# Patient Record
Sex: Male | Born: 1940
Health system: Southern US, Community
[De-identification: ages and names within clinical notes are randomized; demographics above are authoritative.]

## PROBLEM LIST (undated history)

## (undated) DIAGNOSIS — G7000 Myasthenia gravis without (acute) exacerbation: Secondary | ICD-10-CM

## (undated) DIAGNOSIS — N400 Enlarged prostate without lower urinary tract symptoms: Secondary | ICD-10-CM

## (undated) DIAGNOSIS — M545 Low back pain, unspecified: Secondary | ICD-10-CM

## (undated) DIAGNOSIS — R0600 Dyspnea, unspecified: Secondary | ICD-10-CM

## (undated) DIAGNOSIS — I10 Essential (primary) hypertension: Secondary | ICD-10-CM

## (undated) DIAGNOSIS — Z5189 Encounter for other specified aftercare: Secondary | ICD-10-CM

## (undated) DIAGNOSIS — E785 Hyperlipidemia, unspecified: Secondary | ICD-10-CM

## (undated) DIAGNOSIS — G5600 Carpal tunnel syndrome, unspecified upper limb: Secondary | ICD-10-CM

## (undated) DIAGNOSIS — J189 Pneumonia, unspecified organism: Secondary | ICD-10-CM

## (undated) DIAGNOSIS — K219 Gastro-esophageal reflux disease without esophagitis: Secondary | ICD-10-CM

## (undated) DIAGNOSIS — G4700 Insomnia, unspecified: Secondary | ICD-10-CM

## (undated) DIAGNOSIS — D649 Anemia, unspecified: Secondary | ICD-10-CM

## (undated) DIAGNOSIS — F0390 Unspecified dementia without behavioral disturbance: Secondary | ICD-10-CM

## (undated) DIAGNOSIS — G4730 Sleep apnea, unspecified: Secondary | ICD-10-CM

## (undated) DIAGNOSIS — M1990 Unspecified osteoarthritis, unspecified site: Secondary | ICD-10-CM

## (undated) HISTORY — DX: Unspecified osteoarthritis, unspecified site: M19.90

## (undated) HISTORY — DX: Low back pain: M54.5

## (undated) HISTORY — DX: Carpal tunnel syndrome, unspecified upper limb: G56.00

## (undated) HISTORY — DX: Hyperlipidemia, unspecified: E78.5

## (undated) HISTORY — PX: LUMBAR FUSION: SHX111

## (undated) HISTORY — PX: COLONOSCOPY: SHX174

## (undated) HISTORY — DX: Low back pain, unspecified: M54.50

## (undated) HISTORY — PX: CARPAL TUNNEL RELEASE: SHX101

## (undated) HISTORY — DX: Insomnia, unspecified: G47.00

## (undated) HISTORY — DX: Encounter for other specified aftercare: Z51.89

## (undated) HISTORY — DX: Sleep apnea, unspecified: G47.30

---
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
## 1998-02-06 ENCOUNTER — Inpatient Hospital Stay (HOSPITAL_COMMUNITY): Admission: EM | Admit: 1998-02-06 | Discharge: 1998-02-07 | Payer: Self-pay | Admitting: Emergency Medicine

## 1998-04-11 ENCOUNTER — Ambulatory Visit (HOSPITAL_COMMUNITY): Admission: RE | Admit: 1998-04-11 | Discharge: 1998-04-11 | Payer: Self-pay | Admitting: Cardiology

## 1999-10-19 ENCOUNTER — Ambulatory Visit: Admission: RE | Admit: 1999-10-19 | Discharge: 1999-10-19 | Payer: Self-pay | Admitting: Pulmonary Disease

## 2001-08-25 HISTORY — PX: TOTAL KNEE ARTHROPLASTY: SHX125

## 2001-08-31 ENCOUNTER — Encounter: Payer: Self-pay | Admitting: Orthopedic Surgery

## 2001-09-03 ENCOUNTER — Inpatient Hospital Stay (HOSPITAL_COMMUNITY): Admission: RE | Admit: 2001-09-03 | Discharge: 2001-09-07 | Payer: Self-pay | Admitting: Orthopedic Surgery

## 2004-03-11 ENCOUNTER — Encounter: Admission: RE | Admit: 2004-03-11 | Discharge: 2004-03-11 | Payer: Self-pay | Admitting: Sports Medicine

## 2004-03-25 ENCOUNTER — Encounter: Admission: RE | Admit: 2004-03-25 | Discharge: 2004-03-25 | Payer: Self-pay | Admitting: Sports Medicine

## 2004-04-09 ENCOUNTER — Encounter: Admission: RE | Admit: 2004-04-09 | Discharge: 2004-04-09 | Payer: Self-pay | Admitting: Sports Medicine

## 2004-06-03 ENCOUNTER — Ambulatory Visit (HOSPITAL_COMMUNITY): Admission: RE | Admit: 2004-06-03 | Discharge: 2004-06-04 | Payer: Self-pay | Admitting: Neurosurgery

## 2004-06-20 ENCOUNTER — Encounter: Admission: RE | Admit: 2004-06-20 | Discharge: 2004-06-20 | Payer: Self-pay | Admitting: Neurosurgery

## 2004-06-26 ENCOUNTER — Encounter: Admission: RE | Admit: 2004-06-26 | Discharge: 2004-06-26 | Payer: Self-pay | Admitting: Neurosurgery

## 2004-07-10 ENCOUNTER — Ambulatory Visit: Payer: Self-pay | Admitting: Internal Medicine

## 2005-03-27 ENCOUNTER — Ambulatory Visit (HOSPITAL_COMMUNITY): Admission: RE | Admit: 2005-03-27 | Discharge: 2005-03-27 | Payer: Self-pay | Admitting: Neurosurgery

## 2005-05-22 ENCOUNTER — Ambulatory Visit: Payer: Self-pay | Admitting: Internal Medicine

## 2005-05-23 ENCOUNTER — Ambulatory Visit: Payer: Self-pay | Admitting: Internal Medicine

## 2006-05-13 ENCOUNTER — Ambulatory Visit (HOSPITAL_COMMUNITY): Admission: RE | Admit: 2006-05-13 | Discharge: 2006-05-13 | Payer: Self-pay | Admitting: Neurosurgery

## 2006-06-10 ENCOUNTER — Ambulatory Visit: Payer: Self-pay | Admitting: Internal Medicine

## 2006-06-25 ENCOUNTER — Ambulatory Visit: Payer: Self-pay | Admitting: Internal Medicine

## 2006-06-29 ENCOUNTER — Ambulatory Visit: Payer: Self-pay | Admitting: Internal Medicine

## 2006-06-29 LAB — CONVERTED CEMR LAB
ALT: 29 units/L (ref 0–40)
AST: 24 units/L (ref 0–37)
BUN: 18 mg/dL (ref 6–23)
Basophils Absolute: 0.1 10*3/uL (ref 0.0–0.1)
Basophils Relative: 2.5 % — ABNORMAL HIGH (ref 0.0–1.0)
Bilirubin Urine: NEGATIVE
Calcium: 9.6 mg/dL (ref 8.4–10.5)
Chol/HDL Ratio, serum: 3.8
Cholesterol: 168 mg/dL (ref 0–200)
Creatinine, Ser: 0.8 mg/dL (ref 0.4–1.5)
Eosinophil percent: 3.8 % (ref 0.0–5.0)
HCT: 46.3 % (ref 39.0–52.0)
HDL: 44.3 mg/dL (ref >39.0)
Hemoglobin, Urine: NEGATIVE
Hemoglobin: 15.4 g/dL (ref 13.0–17.0)
Ketones, ur: NEGATIVE mg/dL
LDL Cholesterol: 104 mg/dL — ABNORMAL HIGH (ref 0–99)
Leukocytes, UA: NEGATIVE
Lymphocytes Relative: 38.6 % (ref 12.0–46.0)
MCHC: 33.3 g/dL (ref 30.0–36.0)
MCV: 91.7 fL (ref 78.0–100.0)
Monocytes Absolute: 0.7 10*3/uL (ref 0.2–0.7)
Monocytes Relative: 13.8 % — ABNORMAL HIGH (ref 3.0–11.0)
Neutro Abs: 2.1 10*3/uL (ref 1.4–7.7)
Neutrophils Relative %: 41.3 % — ABNORMAL LOW (ref 43.0–77.0)
Nitrite: NEGATIVE
PSA: 0.4 ng/mL (ref 0.10–4.00)
Platelets: 180 10*3/uL (ref 150–400)
Potassium: 4.7 meq/L (ref 3.5–5.1)
RBC: 5.05 M/uL (ref 4.22–5.81)
RDW: 12.7 % (ref 11.5–14.6)
Sodium: 141 meq/L (ref 135–145)
Specific Gravity, Urine: >=1.03 (ref 1.000–1.03)
TSH: 0.54 microintl units/mL (ref 0.35–5.50)
Total Protein, Urine: NEGATIVE mg/dL
Triglyceride fasting, serum: 97 mg/dL (ref 0–149)
Uric Acid, Serum: 5.5 mg/dL (ref 2.4–7.0)
Urine Glucose: NEGATIVE mg/dL
Urobilinogen, UA: 0.2 (ref 0.0–1.0)
VLDL: 19 mg/dL (ref 0–40)
WBC: 5 10*3/uL (ref 4.5–10.5)
pH: 6.5 (ref 5.0–8.0)

## 2006-07-08 ENCOUNTER — Ambulatory Visit: Payer: Self-pay

## 2006-07-28 ENCOUNTER — Inpatient Hospital Stay (HOSPITAL_COMMUNITY): Admission: RE | Admit: 2006-07-28 | Discharge: 2006-07-31 | Payer: Self-pay | Admitting: Neurosurgery

## 2006-12-24 ENCOUNTER — Ambulatory Visit: Payer: Self-pay | Admitting: Internal Medicine

## 2006-12-24 LAB — CONVERTED CEMR LAB: Vit D, 1,25-Dihydroxy: 15 — ABNORMAL LOW (ref 20–57)

## 2007-06-05 ENCOUNTER — Encounter: Payer: Self-pay | Admitting: Internal Medicine

## 2007-06-05 DIAGNOSIS — I2510 Atherosclerotic heart disease of native coronary artery without angina pectoris: Secondary | ICD-10-CM | POA: Insufficient documentation

## 2007-06-05 DIAGNOSIS — M1990 Unspecified osteoarthritis, unspecified site: Secondary | ICD-10-CM | POA: Insufficient documentation

## 2007-06-05 DIAGNOSIS — E785 Hyperlipidemia, unspecified: Secondary | ICD-10-CM | POA: Insufficient documentation

## 2007-06-08 ENCOUNTER — Ambulatory Visit: Payer: Self-pay | Admitting: Internal Medicine

## 2007-07-06 ENCOUNTER — Ambulatory Visit: Payer: Self-pay | Admitting: Internal Medicine

## 2007-07-06 DIAGNOSIS — M545 Low back pain, unspecified: Secondary | ICD-10-CM | POA: Insufficient documentation

## 2007-07-06 DIAGNOSIS — D485 Neoplasm of uncertain behavior of skin: Secondary | ICD-10-CM | POA: Insufficient documentation

## 2007-08-12 ENCOUNTER — Ambulatory Visit: Payer: Self-pay | Admitting: Internal Medicine

## 2007-08-12 DIAGNOSIS — L570 Actinic keratosis: Secondary | ICD-10-CM | POA: Insufficient documentation

## 2007-09-02 ENCOUNTER — Ambulatory Visit: Payer: Self-pay | Admitting: Internal Medicine

## 2007-09-02 LAB — CONVERTED CEMR LAB
ALT: 30 units/L (ref 0–53)
AST: 31 units/L (ref 0–37)
Albumin: 3.8 g/dL (ref 3.5–5.2)
Alkaline Phosphatase: 71 units/L (ref 39–117)
BUN: 17 mg/dL (ref 6–23)
Bilirubin, Direct: 0.1 mg/dL (ref 0.0–0.3)
CO2: 27 meq/L (ref 19–32)
Calcium: 9.1 mg/dL (ref 8.4–10.5)
Chloride: 102 meq/L (ref 96–112)
Cholesterol: 150 mg/dL (ref 0–200)
Creatinine, Ser: 0.8 mg/dL (ref 0.4–1.5)
GFR calc Af Amer: 124 mL/min
GFR calc non Af Amer: 103 mL/min
Glucose, Bld: 106 mg/dL — ABNORMAL HIGH (ref 70–99)
HDL: 38.8 mg/dL — ABNORMAL LOW (ref >39.0)
LDL Cholesterol: 89 mg/dL (ref 0–99)
Potassium: 3.7 meq/L (ref 3.5–5.1)
Sodium: 138 meq/L (ref 135–145)
TSH: 1.11 microintl units/mL (ref 0.35–5.50)
Total Bilirubin: 1 mg/dL (ref 0.3–1.2)
Total CHOL/HDL Ratio: 3.9
Total Protein: 6.8 g/dL (ref 6.0–8.3)
Triglycerides: 112 mg/dL (ref 0–149)
VLDL: 22 mg/dL (ref 0–40)

## 2007-09-07 ENCOUNTER — Ambulatory Visit: Payer: Self-pay | Admitting: Internal Medicine

## 2007-09-07 DIAGNOSIS — R259 Unspecified abnormal involuntary movements: Secondary | ICD-10-CM | POA: Insufficient documentation

## 2007-09-30 ENCOUNTER — Encounter (INDEPENDENT_AMBULATORY_CARE_PROVIDER_SITE_OTHER): Payer: Self-pay | Admitting: *Deleted

## 2008-01-14 ENCOUNTER — Ambulatory Visit: Payer: Self-pay | Admitting: Internal Medicine

## 2008-01-14 DIAGNOSIS — I10 Essential (primary) hypertension: Secondary | ICD-10-CM | POA: Insufficient documentation

## 2008-01-14 DIAGNOSIS — J310 Chronic rhinitis: Secondary | ICD-10-CM | POA: Insufficient documentation

## 2008-01-14 DIAGNOSIS — R109 Unspecified abdominal pain: Secondary | ICD-10-CM | POA: Insufficient documentation

## 2008-01-14 DIAGNOSIS — G4700 Insomnia, unspecified: Secondary | ICD-10-CM | POA: Insufficient documentation

## 2008-02-01 ENCOUNTER — Ambulatory Visit: Payer: Self-pay

## 2008-02-24 ENCOUNTER — Encounter: Admission: RE | Admit: 2008-02-24 | Discharge: 2008-02-24 | Payer: Self-pay | Admitting: Neurosurgery

## 2008-06-15 ENCOUNTER — Ambulatory Visit: Payer: Self-pay | Admitting: Internal Medicine

## 2008-07-05 ENCOUNTER — Ambulatory Visit: Payer: Self-pay | Admitting: Internal Medicine

## 2008-07-06 LAB — CONVERTED CEMR LAB
ALT: 29 units/L (ref 0–53)
AST: 28 units/L (ref 0–37)
Albumin: 4 g/dL (ref 3.5–5.2)
Alkaline Phosphatase: 75 units/L (ref 39–117)
BUN: 13 mg/dL (ref 6–23)
Basophils Absolute: 0 10*3/uL (ref 0.0–0.1)
Basophils Relative: 0.6 % (ref 0.0–3.0)
Bilirubin Urine: NEGATIVE
Bilirubin, Direct: 0.2 mg/dL (ref 0.0–0.3)
CO2: 30 meq/L (ref 19–32)
Calcium: 9.3 mg/dL (ref 8.4–10.5)
Chloride: 103 meq/L (ref 96–112)
Cholesterol: 235 mg/dL (ref 0–200)
Creatinine, Ser: 0.8 mg/dL (ref 0.4–1.5)
Direct LDL: 157.5 mg/dL
Eosinophils Absolute: 0.2 10*3/uL (ref 0.0–0.7)
Eosinophils Relative: 3 % (ref 0.0–5.0)
GFR calc Af Amer: 124 mL/min
GFR calc non Af Amer: 102 mL/min
Glucose, Bld: 103 mg/dL — ABNORMAL HIGH (ref 70–99)
HCT: 45.5 % (ref 39.0–52.0)
HDL: 37.6 mg/dL — ABNORMAL LOW (ref >39.0)
Hemoglobin, Urine: NEGATIVE
Hemoglobin: 15.6 g/dL (ref 13.0–17.0)
Ketones, ur: NEGATIVE mg/dL
Leukocytes, UA: NEGATIVE
Lymphocytes Relative: 39.5 % (ref 12.0–46.0)
MCHC: 34.2 g/dL (ref 30.0–36.0)
MCV: 93.8 fL (ref 78.0–100.0)
Monocytes Absolute: 0.6 10*3/uL (ref 0.1–1.0)
Monocytes Relative: 11.8 % (ref 3.0–12.0)
Neutro Abs: 2.3 10*3/uL (ref 1.4–7.7)
Neutrophils Relative %: 45.1 % (ref 43.0–77.0)
Nitrite: NEGATIVE
PSA: 0.51 ng/mL (ref 0.10–4.00)
Platelets: 197 10*3/uL (ref 150–400)
Potassium: 4.1 meq/L (ref 3.5–5.1)
RBC: 4.85 M/uL (ref 4.22–5.81)
RDW: 12.6 % (ref 11.5–14.6)
Sodium: 140 meq/L (ref 135–145)
Specific Gravity, Urine: 1.015 (ref 1.000–1.03)
TSH: 0.64 microintl units/mL (ref 0.35–5.50)
Total Bilirubin: 1.1 mg/dL (ref 0.3–1.2)
Total CHOL/HDL Ratio: 6.3
Total Protein, Urine: NEGATIVE mg/dL
Total Protein: 7 g/dL (ref 6.0–8.3)
Triglycerides: 134 mg/dL (ref 0–149)
Urine Glucose: NEGATIVE mg/dL
Urobilinogen, UA: 0.2 (ref 0.0–1.0)
VLDL: 27 mg/dL (ref 0–40)
WBC: 5.1 10*3/uL (ref 4.5–10.5)
pH: 6 (ref 5.0–8.0)

## 2008-07-14 ENCOUNTER — Ambulatory Visit: Payer: Self-pay | Admitting: Internal Medicine

## 2008-08-02 ENCOUNTER — Ambulatory Visit: Payer: Self-pay | Admitting: Internal Medicine

## 2008-11-01 ENCOUNTER — Ambulatory Visit: Payer: Self-pay | Admitting: Internal Medicine

## 2008-11-01 LAB — CONVERTED CEMR LAB
ALT: 20 units/L (ref 0–53)
AST: 24 units/L (ref 0–37)
Albumin: 4 g/dL (ref 3.5–5.2)
Alkaline Phosphatase: 60 units/L (ref 39–117)
BUN: 16 mg/dL (ref 6–23)
Bilirubin, Direct: 0.2 mg/dL (ref 0.0–0.3)
CO2: 32 meq/L (ref 19–32)
Calcium: 9.2 mg/dL (ref 8.4–10.5)
Chloride: 103 meq/L (ref 96–112)
Cholesterol: 142 mg/dL (ref 0–200)
Creatinine, Ser: 0.7 mg/dL (ref 0.4–1.5)
GFR calc Af Amer: 145 mL/min
GFR calc non Af Amer: 120 mL/min
Glucose, Bld: 98 mg/dL (ref 70–99)
HDL: 40.8 mg/dL (ref >39.0)
LDL Cholesterol: 76 mg/dL (ref 0–99)
Potassium: 4.2 meq/L (ref 3.5–5.1)
Sodium: 143 meq/L (ref 135–145)
Total Bilirubin: 1 mg/dL (ref 0.3–1.2)
Total CHOL/HDL Ratio: 3.5
Total Protein: 6.7 g/dL (ref 6.0–8.3)
Triglycerides: 125 mg/dL (ref 0–149)
VLDL: 25 mg/dL (ref 0–40)

## 2008-11-06 ENCOUNTER — Ambulatory Visit: Payer: Self-pay | Admitting: Internal Medicine

## 2008-11-06 DIAGNOSIS — B079 Viral wart, unspecified: Secondary | ICD-10-CM | POA: Insufficient documentation

## 2009-02-28 ENCOUNTER — Ambulatory Visit: Payer: Self-pay | Admitting: Internal Medicine

## 2009-02-28 LAB — CONVERTED CEMR LAB
BUN: 20 mg/dL (ref 6–23)
CO2: 32 meq/L (ref 19–32)
Calcium: 9.3 mg/dL (ref 8.4–10.5)
Chloride: 104 meq/L (ref 96–112)
Creatinine, Ser: 0.8 mg/dL (ref 0.4–1.5)
GFR calc non Af Amer: 102.19 mL/min (ref >60)
Glucose, Bld: 104 mg/dL — ABNORMAL HIGH (ref 70–99)
Potassium: 4.2 meq/L (ref 3.5–5.1)
Sodium: 142 meq/L (ref 135–145)

## 2009-03-06 ENCOUNTER — Ambulatory Visit: Payer: Self-pay | Admitting: Internal Medicine

## 2009-03-12 ENCOUNTER — Encounter: Payer: Self-pay | Admitting: Internal Medicine

## 2009-06-28 ENCOUNTER — Ambulatory Visit: Payer: Self-pay | Admitting: Internal Medicine

## 2009-06-28 LAB — CONVERTED CEMR LAB
ALT: 23 units/L (ref 0–53)
AST: 25 units/L (ref 0–37)
Albumin: 4.3 g/dL (ref 3.5–5.2)
Alkaline Phosphatase: 63 units/L (ref 39–117)
BUN: 16 mg/dL (ref 6–23)
Basophils Absolute: 0 10*3/uL (ref 0.0–0.1)
Basophils Relative: 0.3 % (ref 0.0–3.0)
Bilirubin Urine: NEGATIVE
Bilirubin, Direct: 0.2 mg/dL (ref 0.0–0.3)
CO2: 31 meq/L (ref 19–32)
Calcium: 9.6 mg/dL (ref 8.4–10.5)
Chloride: 103 meq/L (ref 96–112)
Cholesterol: 142 mg/dL (ref 0–200)
Creatinine, Ser: 0.8 mg/dL (ref 0.4–1.5)
Eosinophils Absolute: 0.2 10*3/uL (ref 0.0–0.7)
Eosinophils Relative: 3.9 % (ref 0.0–5.0)
GFR calc non Af Amer: 102.09 mL/min (ref >60)
Glucose, Bld: 100 mg/dL — ABNORMAL HIGH (ref 70–99)
HCT: 43.8 % (ref 39.0–52.0)
HDL: 46.3 mg/dL (ref >39.00)
Hemoglobin, Urine: NEGATIVE
Hemoglobin: 14.7 g/dL (ref 13.0–17.0)
Ketones, ur: NEGATIVE mg/dL
LDL Cholesterol: 83 mg/dL (ref 0–99)
Leukocytes, UA: NEGATIVE
Lymphocytes Relative: 37.3 % (ref 12.0–46.0)
Lymphs Abs: 1.8 10*3/uL (ref 0.7–4.0)
MCHC: 33.5 g/dL (ref 30.0–36.0)
MCV: 95.7 fL (ref 78.0–100.0)
Monocytes Absolute: 0.6 10*3/uL (ref 0.1–1.0)
Monocytes Relative: 13.4 % — ABNORMAL HIGH (ref 3.0–12.0)
Neutro Abs: 2.2 10*3/uL (ref 1.4–7.7)
Neutrophils Relative %: 45.1 % (ref 43.0–77.0)
Nitrite: NEGATIVE
PSA: 0.42 ng/mL (ref 0.10–4.00)
Platelets: 177 10*3/uL (ref 150.0–400.0)
Potassium: 4.9 meq/L (ref 3.5–5.1)
RBC: 4.58 M/uL (ref 4.22–5.81)
RDW: 12.4 % (ref 11.5–14.6)
Sodium: 140 meq/L (ref 135–145)
Specific Gravity, Urine: 1.01 (ref 1.000–1.030)
TSH: 0.57 microintl units/mL (ref 0.35–5.50)
Total Bilirubin: 1.1 mg/dL (ref 0.3–1.2)
Total CHOL/HDL Ratio: 3
Total Protein, Urine: NEGATIVE mg/dL
Total Protein: 7.2 g/dL (ref 6.0–8.3)
Triglycerides: 66 mg/dL (ref 0.0–149.0)
Urine Glucose: NEGATIVE mg/dL
Urobilinogen, UA: 1 (ref 0.0–1.0)
VLDL: 13.2 mg/dL (ref 0.0–40.0)
WBC: 4.8 10*3/uL (ref 4.5–10.5)
pH: 6 (ref 5.0–8.0)

## 2009-07-04 ENCOUNTER — Ambulatory Visit: Payer: Self-pay | Admitting: Internal Medicine

## 2009-07-04 DIAGNOSIS — Z87891 Personal history of nicotine dependence: Secondary | ICD-10-CM | POA: Insufficient documentation

## 2009-08-20 ENCOUNTER — Encounter: Payer: Self-pay | Admitting: Internal Medicine

## 2009-12-27 ENCOUNTER — Ambulatory Visit: Payer: Self-pay | Admitting: Internal Medicine

## 2009-12-27 LAB — CONVERTED CEMR LAB
BUN: 19 mg/dL (ref 6–23)
CO2: 31 meq/L (ref 19–32)
Calcium: 9.6 mg/dL (ref 8.4–10.5)
Chloride: 105 meq/L (ref 96–112)
Creatinine, Ser: 0.7 mg/dL (ref 0.4–1.5)
GFR calc non Af Amer: 120.91 mL/min (ref >60)
Glucose, Bld: 87 mg/dL (ref 70–99)
Potassium: 4.2 meq/L (ref 3.5–5.1)
Sodium: 142 meq/L (ref 135–145)

## 2009-12-31 ENCOUNTER — Ambulatory Visit: Payer: Self-pay | Admitting: Internal Medicine

## 2010-05-20 ENCOUNTER — Ambulatory Visit: Payer: Self-pay | Admitting: Internal Medicine

## 2010-05-20 DIAGNOSIS — R059 Cough, unspecified: Secondary | ICD-10-CM | POA: Insufficient documentation

## 2010-05-20 DIAGNOSIS — R05 Cough: Secondary | ICD-10-CM

## 2010-05-23 ENCOUNTER — Encounter: Payer: Self-pay | Admitting: Internal Medicine

## 2010-06-22 ENCOUNTER — Ambulatory Visit: Payer: Self-pay | Admitting: Internal Medicine

## 2010-07-11 ENCOUNTER — Ambulatory Visit: Payer: Self-pay | Admitting: Internal Medicine

## 2010-07-11 LAB — CONVERTED CEMR LAB
ALT: 24 units/L (ref 0–53)
AST: 27 units/L (ref 0–37)
Albumin: 4.1 g/dL (ref 3.5–5.2)
Alkaline Phosphatase: 58 units/L (ref 39–117)
BUN: 17 mg/dL (ref 6–23)
Basophils Absolute: 0 10*3/uL (ref 0.0–0.1)
Basophils Relative: 0.8 % (ref 0.0–3.0)
Bilirubin Urine: NEGATIVE
Bilirubin, Direct: 0.1 mg/dL (ref 0.0–0.3)
CO2: 29 meq/L (ref 19–32)
Calcium: 9.2 mg/dL (ref 8.4–10.5)
Chloride: 101 meq/L (ref 96–112)
Cholesterol: 137 mg/dL (ref 0–200)
Creatinine, Ser: 0.8 mg/dL (ref 0.4–1.5)
Eosinophils Absolute: 0.2 10*3/uL (ref 0.0–0.7)
Eosinophils Relative: 4.2 % (ref 0.0–5.0)
GFR calc non Af Amer: 101.77 mL/min (ref >60)
Glucose, Bld: 99 mg/dL (ref 70–99)
HCT: 40.8 % (ref 39.0–52.0)
HDL: 43.6 mg/dL (ref >39.00)
Hemoglobin, Urine: NEGATIVE
Hemoglobin: 13.7 g/dL (ref 13.0–17.0)
Ketones, ur: NEGATIVE mg/dL
LDL Cholesterol: 78 mg/dL (ref 0–99)
Leukocytes, UA: NEGATIVE
Lymphocytes Relative: 47.3 % — ABNORMAL HIGH (ref 12.0–46.0)
Lymphs Abs: 2.1 10*3/uL (ref 0.7–4.0)
MCHC: 33.7 g/dL (ref 30.0–36.0)
MCV: 94.7 fL (ref 78.0–100.0)
Monocytes Absolute: 0.6 10*3/uL (ref 0.1–1.0)
Monocytes Relative: 14.4 % — ABNORMAL HIGH (ref 3.0–12.0)
Neutro Abs: 1.5 10*3/uL (ref 1.4–7.7)
Neutrophils Relative %: 33.3 % — ABNORMAL LOW (ref 43.0–77.0)
Nitrite: NEGATIVE
PSA: 0.46 ng/mL (ref 0.10–4.00)
Platelets: 180 10*3/uL (ref 150.0–400.0)
Potassium: 4.9 meq/L (ref 3.5–5.1)
RBC: 4.31 M/uL (ref 4.22–5.81)
RDW: 13 % (ref 11.5–14.6)
Sodium: 141 meq/L (ref 135–145)
Specific Gravity, Urine: 1.025 (ref 1.000–1.030)
TSH: 0.58 microintl units/mL (ref 0.35–5.50)
Total Bilirubin: 0.8 mg/dL (ref 0.3–1.2)
Total CHOL/HDL Ratio: 3
Total Protein, Urine: NEGATIVE mg/dL
Total Protein: 6.5 g/dL (ref 6.0–8.3)
Triglycerides: 76 mg/dL (ref 0.0–149.0)
Urine Glucose: NEGATIVE mg/dL
Urobilinogen, UA: 0.2 (ref 0.0–1.0)
VLDL: 15.2 mg/dL (ref 0.0–40.0)
WBC: 4.4 10*3/uL — ABNORMAL LOW (ref 4.5–10.5)
pH: 5.5 (ref 5.0–8.0)

## 2010-07-16 ENCOUNTER — Ambulatory Visit: Payer: Self-pay | Admitting: Internal Medicine

## 2010-07-16 ENCOUNTER — Encounter: Payer: Self-pay | Admitting: Internal Medicine

## 2010-07-16 DIAGNOSIS — M79609 Pain in unspecified limb: Secondary | ICD-10-CM | POA: Insufficient documentation

## 2010-07-25 ENCOUNTER — Ambulatory Visit: Payer: Self-pay | Admitting: Internal Medicine

## 2010-07-25 ENCOUNTER — Encounter (INDEPENDENT_AMBULATORY_CARE_PROVIDER_SITE_OTHER): Payer: Self-pay | Admitting: *Deleted

## 2010-07-29 ENCOUNTER — Telehealth: Payer: Self-pay | Admitting: Internal Medicine

## 2010-08-22 ENCOUNTER — Encounter (INDEPENDENT_AMBULATORY_CARE_PROVIDER_SITE_OTHER): Payer: Self-pay

## 2010-08-25 HISTORY — PX: EYE SURGERY: SHX253

## 2010-08-27 ENCOUNTER — Ambulatory Visit
Admission: RE | Admit: 2010-08-27 | Discharge: 2010-08-27 | Payer: Self-pay | Source: Home / Self Care | Attending: Gastroenterology | Admitting: Gastroenterology

## 2010-09-11 ENCOUNTER — Ambulatory Visit
Admission: RE | Admit: 2010-09-11 | Discharge: 2010-09-11 | Payer: Self-pay | Source: Home / Self Care | Attending: Gastroenterology | Admitting: Gastroenterology

## 2010-09-11 ENCOUNTER — Encounter: Payer: Self-pay | Admitting: Gastroenterology

## 2010-09-11 LAB — HM COLONOSCOPY

## 2010-09-25 ENCOUNTER — Ambulatory Visit: Payer: Self-pay | Admitting: Internal Medicine

## 2010-09-25 ENCOUNTER — Ambulatory Visit: Admit: 2010-09-25 | Payer: Self-pay | Admitting: Internal Medicine

## 2010-09-26 NOTE — Progress Notes (Signed)
Summary: Rf Zolpidem  Phone Note Refill Request Message from:  Fax from Pharmacy  Refills Requested: Medication #1:  ZOLPIDEM TARTRATE 10 MG  TABS 1/2 or 1 by mouth at hs prn   Dosage confirmed as above?Dosage Confirmed   Supply Requested: 30  Method Requested: Telephone to Pharmacy Next Appointment Scheduled: 09-25-2010 Initial call taken by: Lanier Prude, [facility]),  July 29, 2010 11:43 AM  Follow-up for Phone Call        ok 6 ref Follow-up by: Tresa Garter MD,  July 30, 2010 8:41 AM  Additional Follow-up for Phone Call Additional follow up Details #1::        Rx called to pharmacy Additional Follow-up by: Lanier Prude, [facility]),  July 30, 2010 12:01 PM    Prescriptions: ZOLPIDEM TARTRATE 10 MG  TABS (ZOLPIDEM TARTRATE) 1/2 or 1 by mouth at hs prn  #30 x 5   Entered by:   Lanier Prude, [facility])   Authorized by:   Tresa Garter MD   Signed by:   Lanier Prude, CMA(AAMA) on 07/30/2010   Method used:   Telephoned to ...       [facility] [address] (retail)       [address]       [place], [place]  [address]       Ph: [phone] or [phone]       Fax: [phone]   RxID:   [id number]

## 2010-09-26 NOTE — Assessment & Plan Note (Signed)
Summary: CPX/ SECURE HORIZIONS/NWS  #   Vital Signs:  Patient profile:   70 year old male Height:      72 inches Weight:      255 pounds BMI:     34.71 Temp:     97.7 degrees F oral Pulse rate:   76 / minute Pulse rhythm:   regular Resp:     16 per minute BP sitting:   128 / 90  (left arm) Cuff size:   regular  Vitals Entered By: Lanier Prude, [facility]) (July 16, 2010 8:44 AM) CC: CPX Is Patient Diabetic? No   CC:  CPX.  History of Present Illness: The patient presents for a preventive health examination  Patient past medical history, social history, and family history reviewed in detail no significant changes.  Patient is physically active. Depression is negative and mood is good. Hearing is normal w/hearing aids, and able to perform activities of daily living. Risk of falling is negligible and home safety has been reviewed and is appropriate. Patient has normal height, he is overweight, and visual acuity is ok w/glasses. Patient has been counseled on age-appropriate routine health concerns for screening and prevention. Education, counseling done.  C/o pain in L buttock and posterior thigh worse w/sitting x 2 months   Preventive Screening-Counseling & Management  Alcohol-Tobacco     Alcohol drinks/day: <1     Smoking Status: quit > 6 months  Caffeine-Diet-Exercise     Caffeine Counseling: not indicated; caffeine use is not excessive or problematic     Diet Counseling: to improve diet; diet is suboptimal     Nutrition Referrals: no     Does Patient Exercise: yes     Depression Counseling: not indicated; screening negative for depression  Hep-HIV-STD-Contraception     Hepatitis Risk: no risk noted     Sun Exposure-Excessive: no  Safety-Violence-Falls     Seat Belt Use: yes     Violence in the Home: no risk noted     Fall Risk Counseling: not indicated; no significant falls noted      Sexual History:  currently monogamous.        Drug Use:  never.    Current  Medications (verified): 1)  Cymbalta 60 Mg  Cpep (Duloxetine Hcl) .Marland Kitchen.. 1 Once Daily 2)  Aspirin 325 Mg Tabs (Aspirin) 3)  Vitamin D3 1000 Unit  Tabs (Cholecalciferol) .Marland Kitchen.. 1 Qd 4)  Zolpidem Tartrate 10 Mg  Tabs (Zolpidem Tartrate) .... 1/2 or 1 By Mouth At [facility] Prn 5)  Ibuprofen 600 Mg  Tabs (Ibuprofen) .Marland Kitchen.. 1 By Mouth Two Times A Day Pc As Needed Arthritis 6)  Simvastatin 40 Mg Tabs (Simvastatin) .... Take One Tablet At Bedtime 7)  Ibuprofen 600 Mg  Tabs (Ibuprofen) .Marland Kitchen.. 1 By Mouth Two Times A Day Pc As Needed Pain 8)  Tramadol Hcl 50 Mg  Tabs (Tramadol Hcl) .Marland Kitchen.. 1-2 By Mouth Two Times A Day As Needed Pain 9)  Pennsaid 1.5 % Soln (Diclofenac Sodium) .... 3-5 Gtt To Skin Three Times Daily 10)  Promethazine-Codeine 6.25-10 Mg/39ml Syrp (Promethazine-Codeine) .... 5-10 Ml By Mouth Q Id As Needed Cough 11)  Tessalon Perles 100 Mg Caps (Benzonatate) .Marland Kitchen.. 1-2 By Mouth Two Times A Day As Needed Cogh 12)  Levaquin 500 Mg Tabs (Levofloxacin) .Marland Kitchen.. 1 By Mouth Once Daily For Lung Infection  Past History:  Past Medical History: Last updated: 01/14/2008 Coronary artery disease Dr Jens Som Hyperlipidemia OSA Osteoarthritis Low back pain CTS B Hypertension Insomnia  Past  Surgical History: Last updated: 07/06/2007 Total knee replacement R 2003 Lumbar fusion 07 Cage Dr Venetia Maxon CTS release Dr Merlyn Lot  Family History: Last updated: 01/14/2008 Family History Breast cancer 1st degree relative <50 Family History of CAD Male 1st degree relative <50 F AAA  Social History: Last updated: 2010-07-30 B died with ca - lymphomas Married Former Smoker Alcohol use-no Regular exercise-yes 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 with ca - lymphomas Married Former Smoker Alcohol use-no Regular exercise-yes Retired Smoking Status:  quit > 6 months Does Patient Exercise:  yes Hepatitis Risk:  no risk noted Sun Exposure-Excessive:  no Seat Belt Use:  yes Sexual History:  currently monogamous Drug Use:   never  Review of Systems  The patient denies anorexia, fever, weight loss, weight gain, vision loss, decreased hearing, hoarseness, chest pain, syncope, dyspnea on exertion, peripheral edema, prolonged cough, headaches, hemoptysis, abdominal pain, melena, hematochezia, severe indigestion/heartburn, hematuria, incontinence, genital sores, muscle weakness, suspicious skin lesions, transient blindness, difficulty walking, depression, unusual weight change, abnormal bleeding, enlarged lymph nodes, angioedema, and testicular masses.    Physical Exam  General:  alert.  NAD Head:  Normocephalic and atraumatic without obvious abnormalities. No apparent alopecia or balding. Eyes:  No corneal or conjunctival inflammation noted. EOMI. Perrla.  Ears:  External ear exam shows no significant lesions or deformities.  Otoscopic examination reveals clear canals, tympanic membranes are intact bilaterally without bulging, retraction, inflammation or discharge. Hearing is grossly normal bilaterally. Nose:  External nasal examination shows no deformity or inflammation. Nasal mucosa are pink and moist without lesions or exudates. Mouth:  no erythema, no exudates, and no lesions.   Neck:  supple, no masses, and no cervical lymphadenopathy.   Lungs:  normal respiratory effort, no intercostal retractions, no accessory muscle use, normal breath sounds, no dullness, no crackles, and no wheezes.   Heart:  Tachy Abdomen:  Bowel sounds positive,abdomen soft and non-tender without masses, organomegaly or hernias noted. Msk:  R buttock and R poster hamstr is very tight Str leg elev is ok Neurologic:  No cranial nerve deficits noted. Station and gait are normal. Plantar reflexes are down-going bilaterally. DTRs are symmetrical throughout. Sensory, motor and coordinative functions appear intact. Skin:  AKs Moles on back Psych:  Cognition and judgment appear intact. Alert and cooperative with normal attention span and  concentration. No apparent delusions, illusions, hallucinations   Impression & Recommendations:  Problem # 1:  Preventive Health Care (ICD-V70.0) Assessment New Overall doing well, age appropriate education and counseling updated and referral for appropriate preventive services done unless declined, immunizations up to date or declined, diet counseling done if overweight, urged to quit smoking if smokes, most recent labs reviewed and current ordered if appropriate, ecg reviewed or declined (interpretation per ECG scanned in the EMR if done); information regarding Medicare Preventation requirements given if appropriate.  The labs were reviewed with the patient.  I have personally reviewed the Medicare Annual Wellness questionnaire and have noted 1.   The patient's medical and social history 2.   Their use of alcohol, tobacco or illicit drugs 3.   Their current medications and supplements 4.   The patient's functional ability including ADL's, fall risks, home safety risks and hearing or visual             impairment. 5.   Diet and physical activities 6.   Evidence for depression or mood disorders  The patients weight, height, BMI and visual acuity have been recorded in the chart I have made  referrals, counseling and provided education to the patient based review of the above and I have provided the pt with a written personalized care plan for preventive services.  Problem # 2:  LEG PAIN (ICD-729.5) L - poss piriformis syndrome Assessment: New See "Patient Instructions".   Problem # 3:  NEOPLASM OF UNCERTAIN BEHAVIOR OF SKIN (ICD-238.2) skin biopsy and cryo to sch  Problem # 4:  HYPERTENSION (ICD-401.9) Assessment: Unchanged  Problem # 5:  OSTEOARTHRITIS (ICD-715.90) Assessment: Unchanged  The following medications were removed from the medication list:    Ibuprofen 600 Mg Tabs (Ibuprofen) .Marland Kitchen... 1 by mouth two times a day pc as needed pain His updated medication list for this problem  includes:    Aspirin 325 Mg Tabs (Aspirin)    Ibuprofen 600 Mg Tabs (Ibuprofen) .Marland Kitchen... 1 by mouth two times a day pc as needed arthritis    Tramadol Hcl 50 Mg Tabs (Tramadol hcl) .Marland Kitchen... 1-2 by mouth two times a day as needed pain  Complete Medication List: 1)  Cymbalta 60 Mg Cpep (Duloxetine hcl) .Marland Kitchen.. 1 once daily 2)  Aspirin 325 Mg Tabs (Aspirin) 3)  Vitamin D3 1000 Unit Tabs (Cholecalciferol) .Marland Kitchen.. 1 qd 4)  Zolpidem Tartrate 10 Mg Tabs (Zolpidem tartrate) .... 1/2 or 1 by mouth at hs prn 5)  Ibuprofen 600 Mg Tabs (Ibuprofen) .Marland Kitchen.. 1 by mouth two times a day pc as needed arthritis 6)  Simvastatin 40 Mg Tabs (Simvastatin) .... Take one tablet at bedtime 7)  Tramadol Hcl 50 Mg Tabs (Tramadol hcl) .Marland Kitchen.. 1-2 by mouth two times a day as needed pain 8)  Pennsaid 1.5 % Soln (Diclofenac sodium) .... 3-5 gtt to skin three times daily  Other Orders: EKG w/ Interpretation (93000) Medicare -1st Annual Wellness Visit [phone]) Gastroenterology Referral (GI)  Patient Instructions: 1)  Please schedule a follow-up appointment in 3 months. 2)  Go on Youtube ([email or web address]) and look up "piriformis stretch", "IT band stretch" and "gluteus stretch". See the anatomy and learn the symptoms.  Do the stretches - it may help!  3)  Call if you are not better in a reasonable amount of time or if worse.  4)  Skin biopsy w/me Prescriptions: IBUPROFEN 600 MG  TABS (IBUPROFEN) 1 by mouth two times a day pc as needed arthritis  #60 x 3   Entered and Authorized by:   Tresa Garter MD   Signed by:   Tresa Garter MD on 07/20/2010   Method used:   Print then Give to Patient   RxID:   [id number] ZOLPIDEM TARTRATE 10 MG  TABS (ZOLPIDEM TARTRATE) 1/2 or 1 by mouth at hs prn  #30 x 3   Entered and Authorized by:   Tresa Garter MD   Signed by:   Tresa Garter MD on 07/20/2010   Method used:   Print then Give to Patient   RxID:   [id number] CYMBALTA 60 MG  CPEP (DULOXETINE HCL) 1 once  daily  #90 x 3   Entered and Authorized by:   Tresa Garter MD   Signed by:   Tresa Garter MD on 07/20/2010   Method used:   Print then Give to Patient   RxID:   [id number]    Orders Added: 1)  EKG w/ Interpretation [93000] 2)  Medicare -1st Annual Wellness Visit [G0438] 3)  Gastroenterology Referral [GI] 4)  Est. Patient Level IV [06301]

## 2010-09-26 NOTE — Miscellaneous (Signed)
  Clinical Lists Changes  Orders: Added new Service order of Rocephin  250mg  (N0272) - Signed Added new Service order of Admin of Therapeutic Inj  intramuscular or subcutaneous (53664) - Signed      Medication Administration  Injection # 1:    Medication: Rocephin  250mg     Diagnosis: PNEUMONIA (ICD-486)    Route: IM    Site: LUOQ gluteus    Exp Date: 08/25/2012    Lot #: [id number]    Mfr: Sandoz    Comments: pt rec 1gm    Patient tolerated injection without complications    Given by: Lanier Prude, CMA(AAMA) (May 23, 2010 7:53 AM)  Orders Added: 1)  Rocephin  250mg  [J0696] 2)  Admin of Therapeutic Inj  intramuscular or subcutaneous [25956]

## 2010-09-26 NOTE — Assessment & Plan Note (Signed)
Summary: RECENT HX PHENUMONIA,COUGHING AGAIN AND CHEST HURTING...AS.   Vital Signs:  Patient profile:   70 year old male Height:      72 inches Weight:      249.50 pounds O2 Sat:      93 % Temp:     98 degrees F oral Pulse rate:   75 / minute Resp:     24 per minute BP sitting:   130 / 70  (left arm) Cuff size:   large  Vitals Entered By: Jarome Lamas (June 22, 2010 10:46 AM) CC: congestion/pb   History of Present Illness: Feels like he isn't over the respiratory infection Now with some fever---tylenol helped this  cough has recurred did seem to be over it when the antibiotics were done but symptoms have crept back  No night sweats some chills last night  slightly SOB has feeling of chest tightness No wheezing  Current Medications (verified): 1)  Cymbalta 60 Mg  Cpep (Duloxetine Hcl) .Marland Kitchen.. 1 Once Daily 2)  Aspirin 325 Mg Tabs (Aspirin) 3)  Vitamin D3 1000 Unit  Tabs (Cholecalciferol) .Marland Kitchen.. 1 Qd 4)  Zolpidem Tartrate 10 Mg  Tabs (Zolpidem Tartrate) .... 1/2 or 1 By Mouth At [facility] Prn 5)  Ibuprofen 600 Mg  Tabs (Ibuprofen) .Marland Kitchen.. 1 By Mouth Two Times A Day Pc As Needed Arthritis 6)  Simvastatin 40 Mg Tabs (Simvastatin) .... Take One Tablet At Bedtime 7)  Ibuprofen 600 Mg  Tabs (Ibuprofen) .Marland Kitchen.. 1 By Mouth Two Times A Day Pc As Needed Pain 8)  Tramadol Hcl 50 Mg  Tabs (Tramadol Hcl) .Marland Kitchen.. 1-2 By Mouth Two Times A Day As Needed Pain 9)  Pennsaid 1.5 % Soln (Diclofenac Sodium) .... 3-5 Gtt To Skin Three Times Daily 10)  Promethazine-Codeine 6.25-10 Mg/38ml Syrp (Promethazine-Codeine) .... 5-10 Ml By Mouth Q Id As Needed Cough 11)  Tessalon Perles 100 Mg Caps (Benzonatate) .Marland Kitchen.. 1-2 By Mouth Two Times A Day As Needed Cogh 12)  Levaquin 500 Mg Tabs (Levofloxacin) .Marland Kitchen.. 1 By Mouth Qd  Allergies (verified): 1)  Tramadol Hcl  Past History:  Past medical, surgical, family and social histories (including risk factors) reviewed for relevance to current acute and chronic  problems.  Past Medical History: Reviewed history from 01/14/2008 and no changes required. Coronary artery disease Dr Jens Som Hyperlipidemia OSA Osteoarthritis Low back pain CTS B Hypertension Insomnia  Past Surgical History: Reviewed history from 07/06/2007 and no changes required. Total knee replacement R 2003 Lumbar fusion 07 Cage Dr Venetia Maxon CTS release Dr Merlyn Lot  Family History: Reviewed history from 01/14/2008 and no changes required. Family History Breast cancer 1st degree relative <50 Family History of CAD Male 1st degree relative <50 F AAA  Social History: Reviewed history from 07/14/2008 and no changes required.  Married Former Smoker Alcohol use-no Regular exercise-yes Retired  Review of Systems       No vomiting some diarrhea this week---freq and loose slight abd discomfort appetite is off some since yesterday  Physical Exam  General:  alert.  NAD Mouth:  no erythema, no exudates, and no lesions.   Neck:  supple, no masses, and no cervical lymphadenopathy.   Lungs:  normal respiratory effort, no intercostal retractions, no accessory muscle use, normal breath sounds, no dullness, no crackles, and no wheezes.     Impression & Recommendations:  Problem # 1:  PNEUMONIA (ICD-486) Assessment Comment Only has recurrence after seemed to be better Clinical course and the CXR support possible atypical infection--so recurrence off the  antibioitic not surprising looks okay and lung exam negative  P: will give another antibiotic course      has follow up in  ~2 weeks with DR [name]  His updated medication list for this problem includes:    Levaquin 500 Mg Tabs (Levofloxacin) .Marland Kitchen... 1 by mouth once daily for lung infection  Complete Medication List: 1)  Cymbalta 60 Mg Cpep (Duloxetine hcl) .Marland Kitchen.. 1 once daily 2)  Aspirin 325 Mg Tabs (Aspirin) 3)  Vitamin D3 1000 Unit Tabs (Cholecalciferol) .Marland Kitchen.. 1 qd 4)  Zolpidem Tartrate 10 Mg Tabs (Zolpidem tartrate)  .... 1/2 or 1 by mouth at hs prn 5)  Ibuprofen 600 Mg Tabs (Ibuprofen) .Marland Kitchen.. 1 by mouth two times a day pc as needed arthritis 6)  Simvastatin 40 Mg Tabs (Simvastatin) .... Take one tablet at bedtime 7)  Ibuprofen 600 Mg Tabs (Ibuprofen) .Marland Kitchen.. 1 by mouth two times a day pc as needed pain 8)  Tramadol Hcl 50 Mg Tabs (Tramadol hcl) .Marland Kitchen.. 1-2 by mouth two times a day as needed pain 9)  Pennsaid 1.5 % Soln (Diclofenac sodium) .... 3-5 gtt to skin three times daily 10)  Promethazine-codeine 6.25-10 Mg/68ml Syrp (Promethazine-codeine) .... 5-10 ml by mouth q id as needed cough 11)  Tessalon Perles 100 Mg Caps (Benzonatate) .Marland Kitchen.. 1-2 by mouth two times a day as needed cogh 12)  Levaquin 500 Mg Tabs (Levofloxacin) .Marland Kitchen.. 1 by mouth once daily for lung infection  Patient Instructions: 1)  Please take the antibiotic daily for 10 more days 2)  Please keep appt with Dr Posey Rea on November 11th Prescriptions: LEVAQUIN 500 MG TABS (LEVOFLOXACIN) 1 by mouth once daily for lung infection  #10 x 0   Entered and Authorized by:   Cindee Salt MD   Signed by:   Cindee Salt MD on 06/22/2010   Method used:   [facility]        [phone] (retail[facility]       [address]       [place], [place]  [address]       Ph: [phone] or [phone]       Fax: [phone]   RxID:   [id number]    Orders Added: 1)  Est. Patient Level III [38756]

## 2010-09-26 NOTE — Miscellaneous (Signed)
Summary: Procedure consent  Procedure consent   Imported By: Lester Gulkana 07/29/2010 10:16:33  _____________________________________________________________________  External Attachment:    Type:   Image     Comment:   External Document

## 2010-09-26 NOTE — Assessment & Plan Note (Signed)
Summary: FU  $50   STC   Vital Signs:  Patient profile:   70 year old male Height:      72 inches Weight:      251 pounds BMI:     34.16 Temp:     98.2 degrees F oral Pulse rate:   63 / minute BP sitting:   120 / 84  (left arm)  Vitals Entered By: Tora Perches (March 06, 2009 8:17 AM) CC: f/u Is Patient Diabetic? No   CC:  f/u.  History of Present Illness: The patient presents for a follow up of hypertension, depression, hyperlipidemia   Current Medications (verified): 1)  Cymbalta 60 Mg  Cpep (Duloxetine Hcl) .Marland Kitchen.. 1 Once Daily 2)  Aspirin 325 Mg Tabs (Aspirin) 3)  Vitamin D3 1000 Unit  Tabs (Cholecalciferol) .Marland Kitchen.. 1 Qd 4)  Zolpidem Tartrate 10 Mg  Tabs (Zolpidem Tartrate) .... 1/2 or 1 By Mouth At [facility] Prn 5)  Ibuprofen 600 Mg  Tabs (Ibuprofen) .Marland Kitchen.. 1 By Mouth Two Times A Day Pc As Needed Arthritis 6)  Simvastatin 40 Mg Tabs (Simvastatin) .... Take One Tablet At Bedtime  Allergies: 1)  Tramadol Hcl  Past History:  Past Medical History: Last updated: 01/14/2008 Coronary artery disease Dr Jens Som Hyperlipidemia OSA Osteoarthritis Low back pain CTS B Hypertension Insomnia  Past Surgical History: Last updated: 07/06/2007 Total knee replacement R 2003 Lumbar fusion 07 Cage Dr Venetia Maxon CTS release Dr Merlyn Lot  Family History: Last updated: 01/14/2008 Family History Breast cancer 1st degree relative <50 Family History of CAD Male 1st degree relative <50 F AAA  Social History: Last updated: 07/14/2008  Married Former Smoker Alcohol use-no Regular exercise-yes Retired  Physical Exam  General:  overweight-appearing.   Nose:  External nasal examination shows no deformity or inflammation. Nasal mucosa are pink and moist without lesions or exudates. Mouth:  Oral mucosa and oropharynx without lesions or exudates.  Teeth in good repair. Neck:  No deformities, masses, or tenderness noted. Lungs:  Normal respiratory effort, chest expands symmetrically. Lungs  are clear to auscultation, no crackles or wheezes. Heart:  Normal rate and regular rhythm. S1 and S2 normal without gallop, murmur, click, rub or other extra sounds. Abdomen:  Bowel sounds positive,abdomen soft and non-tender without masses, organomegaly or hernias noted. Msk:  No deformity or scoliosis noted of thoracic or lumbar spine.   Extremities:  No clubbing, cyanosis, edema, or deformity noted with normal full range of motion of all joints.   Neurologic:  No cranial nerve deficits noted. Station and gait are normal. Plantar reflexes are down-going bilaterally. DTRs are symmetrical throughout. Sensory, motor and coordinative functions appear intact. Skin:  5 mm wart L shoulder Psych:  Cognition and judgment appear intact. Alert and cooperative with normal attention span and concentration. No apparent delusions, illusions, hallucinations   Impression & Recommendations:  Problem # 1:  HYPERTENSION (ICD-401.9) Assessment Improved On prescription therapy   Problem # 2:  LOW BACK PAIN (ICD-724.2) Assessment: Improved  His updated medication list for this problem includes:    Aspirin 325 Mg Tabs (Aspirin)    Ibuprofen 600 Mg Tabs (Ibuprofen) .Marland Kitchen... 1 by mouth two times a day pc as needed arthritis  Problem # 3:  CORONARY ARTERY DISEASE (ICD-414.00) Assessment: Unchanged  His updated medication list for this problem includes:    Aspirin 325 Mg Tabs (Aspirin)  Problem # 4:  HYPERLIPIDEMIA (ICD-272.4) Assessment: Improved  His updated medication list for this problem includes:    Simvastatin 40  Mg Tabs (Simvastatin) .Marland Kitchen... Take one tablet at bedtime  Complete Medication List: 1)  Cymbalta 60 Mg Cpep (Duloxetine hcl) .Marland Kitchen.. 1 once daily 2)  Aspirin 325 Mg Tabs (Aspirin) 3)  Vitamin D3 1000 Unit Tabs (Cholecalciferol) .Marland Kitchen.. 1 qd 4)  Zolpidem Tartrate 10 Mg Tabs (Zolpidem tartrate) .... 1/2 or 1 by mouth at hs prn 5)  Ibuprofen 600 Mg Tabs (Ibuprofen) .Marland Kitchen.. 1 by mouth two times a day pc  as needed arthritis 6)  Simvastatin 40 Mg Tabs (Simvastatin) .... Take one tablet at bedtime  Patient Instructions: 1)  Start taking a yoga class 2)  Try to eat more raw plant food, fresh and dry fruit, raw almonds, leafy vegies, whole foods and less red meat, less animal fat. Avoid processed foods (canned soups, hot dogs, sausage , frozen dinners). Avoid corn syrup or aspartam and Splenda  containing drinks. Make your own salad dressing with olive oil, wine vinegar, garlic etc. 3)  Please schedule a follow-up appointment in 4 months well w/labs. 4)  BMP prior to visit, ICD-9: 5)  Hepatic Panel prior to visit, ICD-9: 6)  Lipid Panel prior to visit, ICD-9: 7)  TSH prior to visit, ICD-9: 8)  CBC w/ Diff prior to visit, ICD-9: 9)  Urine-dip prior to visit, ICD-9: 10)  PSA prior to visit, ICD-9:

## 2010-09-26 NOTE — Letter (Signed)
Summary: Pre Visit Letter Revised  [facility]  [address] [place], [place] [address]   Phone: [phone]  Fax: [phone]        07/25/2010 MRN: [id number] [name] [address] [place], [place]  [address]             Procedure Date:  09/11/2010   Welcome to the Gastroenterology Division at [facility].    You are scheduled to see a nurse for your pre-procedure visit on 08/27/2010 at 2:30 on the 3rd floor at [facility], [address]  We ask that you try to arrive at our office 15 minutes prior to your appointment time to allow for check-in.  Please take a minute to review the attached form.  If you answer "Yes" to one or more of the questions on the first page, we ask that you call the person listed at your earliest opportunity.  If you answer "No" to all of the questions, please complete the rest of the form and bring it to your appointment.    Your nurse visit will consist of discussing your medical and surgical history, your immediate family medical history, and your medications.   If you are unable to list all of your medications on the form, please bring the medication bottles to your appointment and we will list them.  We will need to be aware of both prescribed and over the counter drugs.  We will need to know exact dosage information as well.    Please be prepared to read and sign documents such as consent forms, a financial agreement, and acknowledgement forms.  If necessary, and with your consent, a friend or relative is welcome to sit-in on the nurse visit with you.  Please bring your insurance card so that we may make a copy of it.  If your insurance requires a referral to see a specialist, please bring your referral form from your primary care physician.  No co-pay is required for this nurse visit.     If you cannot keep your appointment, please call [phone] to cancel or reschedule prior to your appointment date.  This  allows [place] the opportunity to schedule an appointment for another patient in need of care.    Thank you for choosing [facility] for your medical needs.  We appreciate the opportunity to care for you.  Please visit [place] at our website  to learn more about our practice.  Sincerely, The Gastroenterology Division

## 2010-09-26 NOTE — Procedures (Addendum)
Summary: Colonoscopy  Patient: [name] Note: All result statuses are Final unless otherwise noted.  Tests: (1) Colonoscopy (COL)   COL Colonoscopy           DONE     [facility]     [address]     [place], [place]  [address]           COLONOSCOPY PROCEDURE REPORT           PATIENT:  [name], [name]  MR#:  [id number]     BIRTHDATE:  30-Jun-1941, 69 yrs. old  GENDER:  male     ENDOSCOPIST:  Vania Rea. Jarold Motto, MD, [facility]     REF. BY:  Linda Hedges. [name], M.D.     PROCEDURE DATE:  09/11/2010     PROCEDURE:  Average-risk screening colonoscopy     G0121     ASA CLASS:  Class II     INDICATIONS:  ??? + FH     MEDICATIONS:   Fentanyl 75 mcg IV, Versed 7 mg IV           DESCRIPTION OF PROCEDURE:   After the risks benefits and     alternatives of the procedure were thoroughly explained, informed     consent was obtained.  Digital rectal exam was performed and     revealed no abnormalities.   The LB 180AL [id number] endoscope was     introduced through the anus and advanced to the cecum, which was     identified by both the appendix and ileocecal valve, limited by     poor preparation, extreme patient discomfort.  EXTREMELY DIFFICULT     EXAM.MOBILE CECUM.REPEAT EXAM WOULD REQUIRE PROPOFOL  The quality     of the prep was excellent, using MoviPrep.  The instrument was     then slowly withdrawn as the colon was fully examined.     <<PROCEDUREIMAGES>>           FINDINGS:  Scattered diverticula were found.  No polyps or cancers     were seen.  Scattered diverticula were found in the sigmoid to     descending colon segments.   Retroflexed views in the rectum     revealed it was not tolerated by the patient.    The scope was     then withdrawn from the patient and the procedure completed.           COMPLICATIONS:  None     ENDOSCOPIC IMPRESSION:     1) Diverticula, scattered     2) No polyps or cancers     3) Diverticula, scattered in the sigmoid to descending colon     segments     RECOMMENDATIONS:     1) high fiber diet     2) Continue current colorectal screening recommendations for     "routine risk" patients with a repeat colonoscopy in 10 years.     REPEAT EXAM:  No           ______________________________     Vania Rea. Jarold Motto, MD, [facility]           CC:  Linda Hedges. [name], M.D.           n.     eSIGNED:   Vania Rea. Magdalene Tardiff at 09/11/2010 09:15 AM           [place], [place], [id number]  Note: An exclamation mark (!) indicates a result that was not dispersed into the flowsheet. Document Creation  Date: 09/11/2010 9:15 AM _______________________________________________________________________  (1) Order result status: Final Collection or observation date-time: 09/11/2010 09:09 Requested date-time:  Receipt date-time:  Reported date-time:  Referring Physician:   Ordering Physician: Sheryn Bison [phone]) Specimen Source:  Source: Launa Grill Order Number: [id number] Lab site:   Appended Document: Colonoscopy    Clinical Lists Changes  Observations: Added new observation of COLONNXTDUE: 08/2020 (09/11/2010 13:48)

## 2010-09-26 NOTE — Assessment & Plan Note (Signed)
Summary: skin bx/#/cd   Vital Signs:  Patient profile:   70 year old male Height:      72 inches Weight:      255 pounds BMI:     34.71 Temp:     97.5 degrees F oral Pulse rate:   84 / minute Pulse rhythm:   regular Resp:     16 per minute BP sitting:   124 / 68  (left arm) Cuff size:   regular  Vitals Entered By: Lanier Prude, Beverly Gust) (July 25, 2010 10:55 AM)  Procedure Note Last Tetanus: Td (07/04/2009)  Mole Biopsy/Removal: The patient complains of irritation and changing mole. Indication: suspicious lesion Consent signed: yes  Procedure # 1: shave biopsy    Size (in cm): 0.4 x 0.4    Region: dorsal    Location: R wrist    Comment: Risks including but not limited by incomplete procedure, bleeding, infection, recurrence were discussed with the patient. Consent form was signed. Tolerated well. Complicatons - none.      Instrument used: dermablade    Anesthesia: 0.5 ml 1% lidocaine w/epinephrine  Procedure # 2: shave biopsy    Size (in cm): 0.6 x 0.4    Region: lateral    Location: R abd wall    Instrument used: same    Anesthesia: 1.0 ml 1% lidocaine w/epinephrine  Cleaned and prepped with: alcohol and betadine Wound dressing: neosporin and bandaid Instructions: daily dressing changes  CC: multiple skin lesions/moles Is Patient Diabetic? No   CC:  multiple skin lesions/moles.  History of Present Illness: He is here for skin biopsy  C/o cough w/yellow sputum x 1 month post his recent illness  Current Medications (verified): 1)  Cymbalta 60 Mg  Cpep (Duloxetine Hcl) .Marland Kitchen.. 1 Once Daily 2)  Aspirin 325 Mg Tabs (Aspirin) 3)  Vitamin D3 1000 Unit  Tabs (Cholecalciferol) .Marland Kitchen.. 1 Qd 4)  Zolpidem Tartrate 10 Mg  Tabs (Zolpidem Tartrate) .... 1/2 or 1 By Mouth At [facility] Prn 5)  Ibuprofen 600 Mg  Tabs (Ibuprofen) .Marland Kitchen.. 1 By Mouth Two Times A Day Pc As Needed Arthritis 6)  Simvastatin 40 Mg Tabs (Simvastatin) .... Take One Tablet At Bedtime 7)  Tramadol Hcl 50 Mg   Tabs (Tramadol Hcl) .Marland Kitchen.. 1-2 By Mouth Two Times A Day As Needed Pain 8)  Pennsaid 1.5 % Soln (Diclofenac Sodium) .... 3-5 Gtt To Skin Three Times Daily  Allergies (verified): No Known Drug Allergies  Physical Exam  General:  alert.  NAD Lungs:  normal respiratory effort, no intercostal retractions, no accessory muscle use, normal breath sounds, no dullness, no crackles, and no wheezes.   Heart:  Tachy Skin:  see procedures   Impression & Recommendations:  Problem # 1:  NEOPLASM OF UNCERTAIN BEHAVIOR OF SKIN (ICD-238.2) Assessment New  Mole #3 6x4 mm R anterior shoulderr, mole #4 11 mm on L upper back, mole #5 7 mm on midback, mole #6 8mm on R lower back and mole #7 in L axilla were removed in the identical fasion. #1,2,3 were sent to path lab. Moles #4-7 were discarded - irritated SKs.  Orders: Shave Skin Lesion 1.1-2.0 cm/trunk/arm/leg ([address]) Shave Skin Lesion 0.6-1.0 cm/trunk/arm/leg ([address]) Shave Skin Lesion 0.6-1.0 cm/trunk/arm/leg ([address]) Shave Skin Lesion 0.6-1.0 cm/trunk/arm/leg (113[address]) Shave Skin Lesion 0.6-1.0 cm/trunk/arm/leg (113[address]) Shave Skin Lesion 0.6-1.0 cm/trunk/arm/leg ([address]) Shave Skin Lesion < 0.5 cm/trunk/arm/leg ([address])  Problem # 2:  COUGH (ICD-786.2) Assessment: Unchanged Ceftin and Prom/cod CXR in Feb   Complete Medication List:  1)  Cymbalta 60 Mg Cpep (Duloxetine hcl) .Marland Kitchen.. 1 once daily 2)  Aspirin 325 Mg Tabs (Aspirin) 3)  Vitamin D3 1000 Unit Tabs (Cholecalciferol) .Marland Kitchen.. 1 qd 4)  Zolpidem Tartrate 10 Mg Tabs (Zolpidem tartrate) .... 1/2 or 1 by mouth at hs prn 5)  Ibuprofen 600 Mg Tabs (Ibuprofen) .Marland Kitchen.. 1 by mouth two times a day pc as needed arthritis 6)  Simvastatin 40 Mg Tabs (Simvastatin) .... Take one tablet at bedtime 7)  Tramadol Hcl 50 Mg Tabs (Tramadol hcl) .Marland Kitchen.. 1-2 by mouth two times a day as needed pain 8)  Pennsaid 1.5 % Soln (Diclofenac sodium) .... 3-5 gtt to skin three times daily 9)  Ceftin 500 Mg Tabs (Cefuroxime axetil) .Marland Kitchen.. 1 by  mouth bid 10)  Promethazine-codeine 6.25-10 Mg/52ml Syrp (Promethazine-codeine) .... 5-10 ml by mouth q id as needed cough Prescriptions: PROMETHAZINE-CODEINE 6.25-10 MG/5ML SYRP (PROMETHAZINE-CODEINE) 5-10 ml by mouth q id as needed cough  #300 ml x 0   Entered and Authorized by:   Tresa Garter MD   Signed by:   Tresa Garter MD on 07/25/2010   Method used:   Print then Give to Patient   RxID:   [id number] CEFTIN 500 MG TABS (CEFUROXIME AXETIL) 1 by mouth bid  #20 x 1   Entered and Authorized by:   Tresa Garter MD   Signed by:   Tresa Garter MD on 07/25/2010   Method used:   Print then Give to Patient   RxID:   [id number]    Orders Added: 1)  Shave Skin Lesion 1.1-2.0 cm/trunk/arm/leg [[address]] 2)  Shave Skin Lesion 0.6-1.0 cm/trunk/arm/leg [113[address]] 3)  Shave Skin Lesion 0.6-1.0 cm/trunk/arm/leg [113[address]] 4)  Shave Skin Lesion 0.6-1.0 cm/trunk/arm/leg [113[address]] 5)  Shave Skin Lesion 0.6-1.0 cm/trunk/arm/leg [113[address]] 6)  Shave Skin Lesion 0.6-1.0 cm/trunk/arm/leg [[address]] 7)  Shave Skin Lesion < 0.5 cm/trunk/arm/leg [[address]] 8)  Est. Patient Level III [[address]]

## 2010-09-26 NOTE — Assessment & Plan Note (Signed)
Summary: cough-headache-head congestion-runny red eyes-fever:101-stc   Vital Signs:  Patient profile:   70 year old male Height:      72 inches Weight:      253 pounds Temp:     99.7 degrees F oral Pulse rate:   88 / minute Pulse rhythm:   regular Resp:     16 per minute BP sitting:   98 / 68  (left arm) Cuff size:   regular  Vitals Entered By: Lanier Prude, Beverly Gust) (May 20, 2010 3:39 PM) CC: fever,cough,congestion X 3 days Is Patient Diabetic? No   CC:  fever, cough, and congestion X 3 days.  History of Present Illness: The patient presents with complaints of sore throat, fever, cough, sinus congestion and drainge of 5 d days duration. Not better with OTC meds. Chest hurts with coughing. Can't sleep due to cough. Muscle aches are present.  The mucus is colored. T 102 last night   Current Medications (verified): 1)  Cymbalta 60 Mg  Cpep (Duloxetine Hcl) .Marland Kitchen.. 1 Once Daily 2)  Aspirin 325 Mg Tabs (Aspirin) 3)  Vitamin D3 1000 Unit  Tabs (Cholecalciferol) .Marland Kitchen.. 1 Qd 4)  Zolpidem Tartrate 10 Mg  Tabs (Zolpidem Tartrate) .... 1/2 or 1 By Mouth At [facility] Prn 5)  Ibuprofen 600 Mg  Tabs (Ibuprofen) .Marland Kitchen.. 1 By Mouth Two Times A Day Pc As Needed Arthritis 6)  Simvastatin 40 Mg Tabs (Simvastatin) .... Take One Tablet At Bedtime 7)  Ibuprofen 600 Mg  Tabs (Ibuprofen) .Marland Kitchen.. 1 By Mouth Two Times A Day Pc As Needed Pain 8)  Tramadol Hcl 50 Mg  Tabs (Tramadol Hcl) .Marland Kitchen.. 1-2 By Mouth Two Times A Day As Needed Pain 9)  Pennsaid 1.5 % Soln (Diclofenac Sodium) .... 3-5 Gtt To Skin Three Times Daily  Allergies (verified): 1)  Tramadol Hcl  Past History:  Past Medical History: Last updated: 01/14/2008 Coronary artery disease Dr Jens Som Hyperlipidemia OSA Osteoarthritis Low back pain CTS B Hypertension Insomnia  Social History: Last updated: 07/14/2008  Married Former Smoker Alcohol use-no Regular exercise-yes Retired  Review of Systems       The patient complains of  fever, hoarseness, chest pain, dyspnea on exertion, and prolonged cough.    Physical Exam  General:  NAD, looks tired; overweight-appearing.   Head:  Normocephalic and atraumatic without obvious abnormalities. No apparent alopecia or balding. Mouth:  Erythematous throat and intranasal mucosa c/w URI   Lungs:  Crackles at bases Heart:  Tachy Abdomen:  Bowel sounds positive,abdomen soft and non-tender without masses, organomegaly or hernias noted. Msk:  No deformity or scoliosis noted of thoracic or lumbar spine.   Neurologic:  No cranial nerve deficits noted. Station and gait are normal. Plantar reflexes are down-going bilaterally. DTRs are symmetrical throughout. Sensory, motor and coordinative functions appear intact. Skin:  Intact without suspicious lesions or rashes Psych:  Cognition and judgment appear intact. Alert and cooperative with normal attention span and concentration. No apparent delusions, illusions, hallucinations   Impression & Recommendations:  Problem # 1:  COUGH (ICD-786.2) due to #2 Assessment New See "Patient Instructions". Start on the regimen of medicine(s) reflected in the chart   Orders: T-2 View CXR (71020TC)  Problem # 2:  PNEUMONIA (ICD-486) Assessment: New  His updated medication list for this problem includes:    Levaquin 500 Mg Tabs (Levofloxacin) .Marland Kitchen... 1 by mouth qd  Complete Medication List: 1)  Cymbalta 60 Mg Cpep (Duloxetine hcl) .Marland Kitchen.. 1 once daily 2)  Aspirin 325 Mg Tabs (  Aspirin) 3)  Vitamin D3 1000 Unit Tabs (Cholecalciferol) .Marland Kitchen.. 1 qd 4)  Zolpidem Tartrate 10 Mg Tabs (Zolpidem tartrate) .... 1/2 or 1 by mouth at hs prn 5)  Ibuprofen 600 Mg Tabs (Ibuprofen) .Marland Kitchen.. 1 by mouth two times a day pc as needed arthritis 6)  Simvastatin 40 Mg Tabs (Simvastatin) .... Take one tablet at bedtime 7)  Ibuprofen 600 Mg Tabs (Ibuprofen) .Marland Kitchen.. 1 by mouth two times a day pc as needed pain 8)  Tramadol Hcl 50 Mg Tabs (Tramadol hcl) .Marland Kitchen.. 1-2 by mouth two times a  day as needed pain 9)  Pennsaid 1.5 % Soln (Diclofenac sodium) .... 3-5 gtt to skin three times daily 10)  Promethazine-codeine 6.25-10 Mg/81ml Syrp (Promethazine-codeine) .... 5-10 ml by mouth q id as needed cough 11)  Tessalon Perles 100 Mg Caps (Benzonatate) .Marland Kitchen.. 1-2 by mouth two times a day as needed cogh 12)  Levaquin 500 Mg Tabs (Levofloxacin) .Marland Kitchen.. 1 by mouth qd 13)  Erythromycin 5 Mg/gm Oint (Erythromycin) .... In affected eye(s)  two times a day  Patient Instructions: 1)  Use over-the-counter medicines for "cold": Tylenol  650mg  or Advil 400mg  every 6 hours  for fever; Delsym or Robutussin for cough. Mucinex or Mucinex D for congestion. Ricola or Halls for sore throat. Office visit if not better or if worse.  Prescriptions: ERYTHROMYCIN 5 MG/GM OINT (ERYTHROMYCIN) in affected eye(s)  two times a day  #1 tube x 0   Entered and Authorized by:   Tresa Garter MD   Signed by:   Tresa Garter MD on 05/20/2010   Method used:   Print then Give to Patient   RxID:   [id number] LEVAQUIN 500 MG TABS (LEVOFLOXACIN) 1 by mouth qd  #10 x 0   Entered and Authorized by:   Tresa Garter MD   Signed by:   Tresa Garter MD on 05/20/2010   Method used:   Print then Give to Patient   RxID:   [id number] TESSALON PERLES 100 MG CAPS (BENZONATATE) 1-2 by mouth two times a day as needed cogh  #120 x 1   Entered and Authorized by:   Tresa Garter MD   Signed by:   Tresa Garter MD on 05/20/2010   Method used:   Print then Give to Patient   RxID:   [id number] PROMETHAZINE-CODEINE 6.25-10 MG/5ML SYRP (PROMETHAZINE-CODEINE) 5-10 ml by mouth q id as needed cough  #300 ml x 0   Entered and Authorized by:   Tresa Garter MD   Signed by:   Tresa Garter MD on 05/20/2010   Method used:   Print then Give to Patient   RxID:   [id number]    Orders Added: 1)  T-2 View CXR [71020TC] 2)  Est. Patient Level IV [[address]]

## 2010-09-26 NOTE — Miscellaneous (Signed)
Summary: Lec previsit  Clinical Lists Changes  Medications: Added new medication of MOVIPREP 100 GM  SOLR (PEG-KCL-NACL-NASULF-NA ASC-C) As per prep instructions. - Signed Rx of MOVIPREP 100 GM  SOLR (PEG-KCL-NACL-NASULF-NA ASC-C) As per prep instructions.;  #1 x 0;  Signed;  Entered by: Ulis Rias RN;  Authorized by: Mardella Layman MD [facility];  Method used: Electronically to [facility] [phone]*, [address] [place], [place]  [address], Ph: [phone] or [phone], Fax: [phone] Observations: Added new observation of NKA: T (08/27/2010 14:25)    Prescriptions: MOVIPREP 100 GM  SOLR (PEG-KCL-NACL-NASULF-NA ASC-C) As per prep instructions.  #1 x 0   Entered by:   Ulis Rias RN   Authorized by:   Mardella Layman MD [facility]   Signed by:   Ulis Rias RN on 08/27/2010   Method used:   [facility]ronically to        [facility] [phone]* (retail)       [address]       [place], [place]  [address]       Ph: [phone] or [phone]       Fax: [phone]   RxID:   [id number]

## 2010-09-26 NOTE — Assessment & Plan Note (Signed)
Summary: 6 mos f/u #/cd   Vital Signs:  Patient profile:   70 year old male Height:      72 inches Weight:      251.75 pounds BMI:     34.27 O2 Sat:      97 % on Room air Temp:     97.6 degrees F oral Pulse rate:   66 / minute BP sitting:   124 / 76  (left arm) Cuff size:   regular  Vitals Entered By: Lucious Groves (Dec 31, 2009 8:02 AM)  O2 Flow:  Room air CC: 6 mo f/u./kb Is Patient Diabetic? No Pain Assessment Patient in pain? no        CC:  6 mo f/u./kb.  History of Present Illness: The patient presents for a follow up of hypertension, CAD, hyperlipidemia   Current Medications (verified): 1)  Cymbalta 60 Mg  Cpep (Duloxetine Hcl) .Marland Kitchen.. 1 Once Daily 2)  Aspirin 325 Mg Tabs (Aspirin) 3)  Vitamin D3 1000 Unit  Tabs (Cholecalciferol) .Marland Kitchen.. 1 Qd 4)  Zolpidem Tartrate 10 Mg  Tabs (Zolpidem Tartrate) .... 1/2 or 1 By Mouth At [facility] Prn 5)  Ibuprofen 600 Mg  Tabs (Ibuprofen) .Marland Kitchen.. 1 By Mouth Two Times A Day Pc As Needed Arthritis 6)  Simvastatin 40 Mg Tabs (Simvastatin) .... Take One Tablet At Bedtime 7)  Ibuprofen 600 Mg  Tabs (Ibuprofen) .Marland Kitchen.. 1 By Mouth Two Times A Day Pc As Needed Pain 8)  Tramadol Hcl 50 Mg  Tabs (Tramadol Hcl) .Marland Kitchen.. 1-2 By Mouth Two Times A Day As Needed Pain 9)  Pennsaid 1.5 % Soln (Diclofenac Sodium) .... 3-5 Gtt To Skin Three Times Daily  Allergies (verified): 1)  Tramadol Hcl  Past History:  Past Medical History: Last updated: 01/14/2008 Coronary artery disease Dr Jens Som Hyperlipidemia OSA Osteoarthritis Low back pain CTS B Hypertension Insomnia  Social History: Last updated: 07/14/2008  Married Former Smoker Alcohol use-no Regular exercise-yes Retired  Review of Systems  The patient denies fever, chest pain, and abdominal pain.    Physical Exam  General:  overweight-appearing.   Head:  Normocephalic and atraumatic without obvious abnormalities. No apparent alopecia or balding. Ears:  External ear exam shows no significant  lesions or deformities.  Otoscopic examination reveals clear canals, tympanic membranes are intact bilaterally without bulging, retraction, inflammation or discharge. Hearing is grossly normal bilaterally. Nose:  External nasal examination shows no deformity or inflammation. Nasal mucosa are pink and moist without lesions or exudates. Mouth:  Oral mucosa and oropharynx without lesions or exudates.  Teeth in good repair. Neck:  No deformities, masses, or tenderness noted. Lungs:  Normal respiratory effort, chest expands symmetrically. Lungs are clear to auscultation, no crackles or wheezes. Heart:  Normal rate and regular rhythm. S1 and S2 normal without gallop, murmur, click, rub or other extra sounds. Abdomen:  Bowel sounds positive,abdomen soft and non-tender without masses, organomegaly or hernias noted. Msk:  No deformity or scoliosis noted of thoracic or lumbar spine.   Extremities:  No clubbing, cyanosis, edema, or deformity noted with normal full range of motion of all joints.   Neurologic:  No cranial nerve deficits noted. Station and gait are normal. Plantar reflexes are down-going bilaterally. DTRs are symmetrical throughout. Sensory, motor and coordinative functions appear intact. Skin:  Intact without suspicious lesions or rashes Psych:  Cognition and judgment appear intact. Alert and cooperative with normal attention span and concentration. No apparent delusions, illusions, hallucinations   Impression & Recommendations:  Problem #  1:  HYPERTENSION (ICD-401.9) Assessment Unchanged  BP today: 124/76 Prior BP: 142/86 (07/04/2009) The labs were reviewed with the patient.  Labs Reviewed: K+: 4.2 (12/27/2009) Creat: : 0.7 (12/27/2009)   Chol: 142 (06/28/2009)   HDL: 46.30 (06/28/2009)   LDL: 83 (06/28/2009)   TG: 66.0 (06/28/2009)  Problem # 2:  LOW BACK PAIN (ICD-724.2) Assessment: Unchanged  His updated medication list for this problem includes:    Aspirin 325 Mg Tabs  (Aspirin)    Ibuprofen 600 Mg Tabs (Ibuprofen) .Marland Kitchen... 1 by mouth two times a day pc as needed arthritis    Ibuprofen 600 Mg Tabs (Ibuprofen) .Marland Kitchen... 1 by mouth two times a day pc as needed pain    Tramadol Hcl 50 Mg Tabs (Tramadol hcl) .Marland Kitchen... 1-2 by mouth two times a day as needed pain  Problem # 3:  CORONARY ARTERY DISEASE (ICD-414.00) Assessment: Unchanged  His updated medication list for this problem includes:    Aspirin 325 Mg Tabs (Aspirin)  Problem # 4:  HYPERLIPIDEMIA (ICD-272.4) Assessment: Unchanged  His updated medication list for this problem includes:    Simvastatin 40 Mg Tabs (Simvastatin) .Marland Kitchen... Take one tablet at bedtime  Complete Medication List: 1)  Cymbalta 60 Mg Cpep (Duloxetine hcl) .Marland Kitchen.. 1 once daily 2)  Aspirin 325 Mg Tabs (Aspirin) 3)  Vitamin D3 1000 Unit Tabs (Cholecalciferol) .Marland Kitchen.. 1 qd 4)  Zolpidem Tartrate 10 Mg Tabs (Zolpidem tartrate) .... 1/2 or 1 by mouth at hs prn 5)  Ibuprofen 600 Mg Tabs (Ibuprofen) .Marland Kitchen.. 1 by mouth two times a day pc as needed arthritis 6)  Simvastatin 40 Mg Tabs (Simvastatin) .... Take one tablet at bedtime 7)  Ibuprofen 600 Mg Tabs (Ibuprofen) .Marland Kitchen.. 1 by mouth two times a day pc as needed pain 8)  Tramadol Hcl 50 Mg Tabs (Tramadol hcl) .Marland Kitchen.. 1-2 by mouth two times a day as needed pain 9)  Pennsaid 1.5 % Soln (Diclofenac sodium) .... 3-5 gtt to skin three times daily  Patient Instructions: 1)  Please schedule a follow-up appointment in 6 months well w/labs v70.0. 2)  Try Kefir on cereal

## 2010-09-26 NOTE — Letter (Signed)
Summary: [facility] Instructions  [facility]  [address] [place], [place] [address]   Phone: [phone]  Fax: [phone]       [name]    1941-05-08    MRN: [id number]        Procedure Day Dorna Bloom:  Wednesday 09/11/2010     Arrival Time:  7:30 am      Procedure Time: 8:30 am     Location of Procedure:                    _x _  [facility] (4th Floor)                        PREPARATION FOR COLONOSCOPY WITH MOVIPREP   Starting 5 days prior to your procedure Friday 1/3 do not eat nuts, seeds, popcorn, corn, beans, peas,  salads, or any raw vegetables.  Do not take any fiber supplements (e.g. Metamucil, Citrucel, and Benefiber).  THE DAY BEFORE YOUR PROCEDURE         DATE: Tuesday 1/17  1.  Drink clear liquids the entire day-NO SOLID FOOD  2.  Do not drink anything colored red or purple.  Avoid juices with pulp.  No orange juice.  3.  Drink at least 64 oz. (8 glasses) of fluid/clear liquids during the day to prevent dehydration and help the prep work efficiently.  CLEAR LIQUIDS INCLUDE: Water Jello Ice Popsicles Tea (sugar ok, no milk/cream) Powdered fruit flavored drinks Coffee (sugar ok, no milk/cream) Gatorade Juice: apple, white grape, white cranberry  Lemonade Clear bullion, consomm, broth Carbonated beverages (any kind) Strained chicken noodle soup Hard Candy                             4.  In the morning, mix first dose of MoviPrep solution:    Empty 1 Pouch A and 1 Pouch B into the disposable container    Add lukewarm drinking water to the top line of the container. Mix to dissolve    Refrigerate (mixed solution should be used within 24 hrs)  5.  Begin drinking the prep at 5:00 p.m. The MoviPrep container is divided by 4 marks.   Every 15 minutes drink the solution down to the next mark (approximately 8 oz) until the full liter is complete.   6.  Follow completed prep with 16 oz of clear liquid of your choice  (Nothing red or purple).  Continue to drink clear liquids until bedtime.  7.  Before going to bed, mix second dose of MoviPrep solution:    Empty 1 Pouch A and 1 Pouch B into the disposable container    Add lukewarm drinking water to the top line of the container. Mix to dissolve    Refrigerate  THE DAY OF YOUR PROCEDURE      DATE: Wednesday 1/18  Beginning at 3:30 a.m. (5 hours before procedure):         1. Every 15 minutes, drink the solution down to the next mark (approx 8 oz) until the full liter is complete.  2. Follow completed prep with 16 oz. of clear liquid of your choice.    3. You may drink clear liquids until 6:30 am (2 HOURS BEFORE PROCEDURE).   MEDICATION INSTRUCTIONS  Unless otherwise instructed, you should take regular prescription medications with a small sip of water   as early as possible the morning  of your procedure.         OTHER INSTRUCTIONS  You will need a responsible adult at least 70 years of age to accompany you and drive you home.   This person must remain in the waiting room during your procedure.  Wear loose fitting clothing that is easily removed.  Leave jewelry and other valuables at home.  However, you may wish to bring a book to read or  an iPod/MP3 player to listen to music as you wait for your procedure to start.  Remove all body piercing jewelry and leave at home.  Total time from sign-in until discharge is approximately 2-3 hours.  You should go home directly after your procedure and rest.  You can resume normal activities the  day after your procedure.  The day of your procedure you should not:   Drive   Make legal decisions   Operate machinery   Drink alcohol   Return to work  You will receive specific instructions about eating, activities and medications before you leave.    The above instructions have been reviewed and explained to me by   Ulis Rias RN  August 27, 2010 3:06 PM     I fully understand and  can verbalize these instructions _____________________________ Date _________

## 2010-11-04 ENCOUNTER — Ambulatory Visit (INDEPENDENT_AMBULATORY_CARE_PROVIDER_SITE_OTHER): Payer: MEDICARE | Admitting: Internal Medicine

## 2010-11-04 ENCOUNTER — Ambulatory Visit (INDEPENDENT_AMBULATORY_CARE_PROVIDER_SITE_OTHER)
Admission: RE | Admit: 2010-11-04 | Discharge: 2010-11-04 | Disposition: A | Discharge status: Home / Self Care | Payer: MEDICARE | Source: Ambulatory Visit | Attending: Internal Medicine | Admitting: Internal Medicine

## 2010-11-04 ENCOUNTER — Other Ambulatory Visit: Payer: Self-pay | Admitting: Internal Medicine

## 2010-11-04 ENCOUNTER — Encounter: Payer: Self-pay | Admitting: Internal Medicine

## 2010-11-04 DIAGNOSIS — N401 Enlarged prostate with lower urinary tract symptoms: Secondary | ICD-10-CM

## 2010-11-04 DIAGNOSIS — R209 Unspecified disturbances of skin sensation: Secondary | ICD-10-CM

## 2010-11-04 DIAGNOSIS — G5600 Carpal tunnel syndrome, unspecified upper limb: Secondary | ICD-10-CM | POA: Insufficient documentation

## 2010-11-04 DIAGNOSIS — M25539 Pain in unspecified wrist: Secondary | ICD-10-CM | POA: Insufficient documentation

## 2010-11-04 DIAGNOSIS — N400 Enlarged prostate without lower urinary tract symptoms: Secondary | ICD-10-CM | POA: Insufficient documentation

## 2010-11-21 NOTE — Assessment & Plan Note (Signed)
Summary: FOLLOW UP /NWS   Vital Signs:  Patient profile:   70 year old male Height:      72 inches (182.88 cm) Weight:      256.25 pounds (116.48 kg) BMI:     34.88 O2 Sat:      97 % on Room air Temp:     98.0 degrees F (36.67 degrees C) oral Pulse rate:   76 / minute Resp:     16 per minute BP sitting:   128 / 72  (left arm) Cuff size:   large  Vitals Entered By: Burnard Leigh CMA(AAMA) (November 04, 2010 8:15 AM)  O2 Flow:  Room air CC: F/U.Marland KitchenDiscuss arthritis in hands/wrist being bothersome/sls,cma Is Patient Diabetic? No Comments Pt needs refills: Ibuprofen 600mg , Tramadol 50mg , Pennsaid 1.5% Sol   CC:  F/U.Marland KitchenDiscuss arthritis in hands/wrist being bothersome/sls and cma.  History of Present Illness: The patient presents for a follow up of hypertension, diabetes, hyperlipidemia C/o L hand weakness x 6 months  C/o wrist swelling from work at time  R>L C/o urinary frequency x months   Current Medications (verified): 1)  Cymbalta 60 Mg  Cpep (Duloxetine Hcl) .Marland Kitchen.. 1 Once Daily 2)  Aspirin 325 Mg Tabs (Aspirin) 3)  Vitamin D3 1000 Unit  Tabs (Cholecalciferol) .Marland Kitchen.. 1 Qd 4)  Zolpidem Tartrate 10 Mg  Tabs (Zolpidem Tartrate) .... 1/2 or 1 By Mouth At [facility] Prn 5)  Ibuprofen 600 Mg  Tabs (Ibuprofen) .Marland Kitchen.. 1 By Mouth Two Times A Day Pc As Needed Arthritis 6)  Simvastatin 40 Mg Tabs (Simvastatin) .... Take One Tablet At Bedtime 7)  Tramadol Hcl 50 Mg  Tabs (Tramadol Hcl) .Marland Kitchen.. 1-2 By Mouth Two Times A Day As Needed Pain 8)  Pennsaid 1.5 % Soln (Diclofenac Sodium) .... 3-5 Gtt To Skin Three Times Daily 9)  Promethazine-Codeine 6.25-10 Mg/76ml Syrp (Promethazine-Codeine) .... 5-10 Ml By Mouth Q Id As Needed Cough  Allergies (verified): No Known Drug Allergies  Past History:  Past Medical History: Last updated: 01/14/2008 Coronary artery disease Dr Jens Som Hyperlipidemia OSA Osteoarthritis Low back pain CTS B Hypertension Insomnia  Social History: Last updated: 15-Aug-2010 B  died with ca - lymphomas Married Former Smoker Alcohol use-no Regular exercise-yes Retired  Physical Exam  General:  alert.  NAD Eyes:  No corneal or conjunctival inflammation noted. EOMI. Perrla.  Mouth:  no erythema, no exudates, and no lesions.   Lungs:  normal respiratory effort, no intercostal retractions, no accessory muscle use, normal breath sounds, no dullness, no crackles, and no wheezes.   Heart:  Tachy Abdomen:  Bowel sounds positive,abdomen soft and non-tender without masses, organomegaly or hernias noted. Msk:  R buttock and R poster hamstr is very tight Str leg elev is ok R dorsal wrist with swelling Extremities:  No clubbing, cyanosis, edema, or deformity noted with normal full range of motion of all joints.   Neurologic:  No cranial nerve deficits noted. Station and gait are normal. Plantar reflexes are down-going bilaterally. DTRs are symmetrical throughout. Sensory, motor and coordinative functions appear intact. Skin:  see procedures Psych:  Cognition and judgment appear intact. Alert and cooperative with normal attention span and concentration. No apparent delusions, illusions, hallucinations   Impression & Recommendations:  Problem # 1:  WRIST PAIN (ZOX-096.04) R>L  Assessment Improved  Sonography Report: Indication: Clinical bedside ultrasound was performed using Terason 3000 machine with a linear transducer.  The images were stored in Terason.   Orders: T-Cervicle Spine 2-3 Views (72040TC) Pred Take  40mg  qd for 3 days, then 20 mg qd for 3 days, then 10mg  qd for 6 days, then stop. Take pc.   Problem # 2:  CARPAL TUNNEL SYNDROME (ICD-354.0) B Assessment: Deteriorated  Orders: TLB-TSH (Thyroid Stimulating Hormone) (84443-TSH) TLB-BMP (Basic Metabolic Panel-BMET) (80048-METABOL) TLB-B12, Serum-Total ONLY (19147-W29) TLB-Sedimentation Rate (ESR) (85652-ESR) TLB-Uric Acid, Blood (84550-URIC) T-Vitamin D (25-Hydroxy) (56213-08657)  Problem # 3:  Weak  grip L due to CTS vs radiculopathy Assessment: New Plan as above  Problem # 4:  BENIGN PROSTATIC HYPERTROPHY, WITH URINARY OBSTRUCTION (ICD-600.01) Assessment: Deteriorated  His updated medication list for this problem includes:    Flomax 0.4 Mg Caps (Tamsulosin hcl) .Marland Kitchen... 1 by mouth once daily for prostate  Orders: TLB-PSA (Prostate Specific Antigen) (84153-PSA) TLB-Udip ONLY (81003-UDIP) T-Rheumatoid Factor (84696-29528)  Complete Medication List: 1)  Cymbalta 60 Mg Cpep (Duloxetine hcl) .Marland Kitchen.. 1 once daily 2)  Aspirin 325 Mg Tabs (Aspirin) 3)  Vitamin D3 1000 Unit Tabs (Cholecalciferol) .Marland Kitchen.. 1 qd 4)  Zolpidem Tartrate 10 Mg Tabs (Zolpidem tartrate) .... 1/2 or 1 by mouth at hs prn 5)  Ibuprofen 600 Mg Tabs (Ibuprofen) .Marland Kitchen.. 1 by mouth two times a day pc as needed arthritis 6)  Simvastatin 40 Mg Tabs (Simvastatin) .... Take one tablet at bedtime 7)  Tramadol Hcl 50 Mg Tabs (Tramadol hcl) .Marland Kitchen.. 1-2 by mouth two times a day as needed pain 8)  Pennsaid 1.5 % Soln (Diclofenac sodium) .... 3-5 gtt to skin three times daily 9)  Prednisone 10 Mg Tabs (Prednisone) .... Take 40mg  qd for 3 days, then 20 mg qd for 3 days, then 10mg  qd for 6 days, then stop. take pc. 10)  Flomax 0.4 Mg Caps (Tamsulosin hcl) .Marland Kitchen.. 1 by mouth once daily for prostate  Other Orders: EMR Misc Charge Code [facility])  Patient Instructions: 1)  Please schedule a follow-up appointment in 1-2 months. Prescriptions: FLOMAX 0.4 MG CAPS (TAMSULOSIN HCL) 1 by mouth once daily for prostate  #30 x 12   Entered and Authorized by:   Tresa Garter MD   Signed by:   Tresa Garter MD on 11/04/2010   Method used:   Electronically to        [facility] [phone]* (retail)       [address]       [place], [place]  [address]       Ph: [phone] or [phone]       Fax: [phone]   RxID:   [id number] PREDNISONE 10 MG TABS (PREDNISONE) Take 40mg  qd for 3 days, then 20 mg qd for 3 days, then 10mg  qd for 6 days,  then stop. Take pc.  #24 x 1   Entered and Authorized by:   Tresa Garter MD   Signed by:   Tresa Garter MD on 11/04/2010   Method used:   Electronically to        [facility] [phone]* (retail)       [address]       [place], [place]  [address]       Ph: [phone] or [phone]       Fax: [phone]   RxID:   [id number] PENNSAID 1.5 % SOLN (DICLOFENAC SODIUM) 3-5 gtt to skin three times daily  #1 x 6   Entered and Authorized by:   Tresa Garter MD   Signed by:   Tresa Garter MD on 11/04/2010   Method used:   Electronically to  [facility] [phone]* (retail)       [address]       [place], [place]  [address]       Ph: [phone] or [phone]       Fax: [phone]   RxID:   [id number] TRAMADOL HCL 50 MG  TABS (TRAMADOL HCL) 1-2 by mouth two times a day as needed pain  #120 x 3   Entered and Authorized by:   Tresa Garter MD   Signed by:   Tresa Garter MD on 11/04/2010   Method used:   Electronically to        [facility] [phone]* (retail)       [address]       [place], [place]  [address]       Ph: [phone] or [phone]       Fax: [phone]   RxID:   [id number] IBUPROFEN 600 MG  TABS (IBUPROFEN) 1 by mouth two times a day pc as needed arthritis  #60 x 3   Entered and Authorized by:   Tresa Garter MD   Signed by:   Tresa Garter MD on 11/04/2010   Method used:   Electronically to        [facility] [phone]* (retail)       [address]       [place], [place]  [address]       Ph: [phone] or [phone]       Fax: [phone]   RxID:   [id number]    Orders Added: 1)  T-Cervicle Spine 2-3 Views [72040TC] 2)  TLB-TSH (Thyroid Stimulating Hormone) [84443-TSH] 3)  TLB-BMP (Basic Metabolic Panel-BMET) [80048-METABOL] 4)  TLB-B12, Serum-Total ONLY [82607-B12] 5)  TLB-Sedimentation Rate (ESR) [85652-ESR] 6)  TLB-Uric Acid, Blood [84550-URIC] 7)  T-Vitamin D (25-Hydroxy)  [78938-10175] 8)  TLB-PSA (Prostate Specific Antigen) [84153-PSA] 9)  TLB-Udip ONLY [81003-UDIP] 10)  T-Rheumatoid Factor [10258-52778] 11)  Est. Patient Level IV [24235] 12)  EMR Misc Charge Code [EMRMisc]   Immunization History:  Influenza Immunization History:    Influenza:  historical (05/25/2010)   Immunization History:  Influenza Immunization History:    Influenza:  Historical (05/25/2010)

## 2010-12-04 ENCOUNTER — Encounter: Payer: Self-pay | Admitting: Internal Medicine

## 2010-12-04 ENCOUNTER — Ambulatory Visit (INDEPENDENT_AMBULATORY_CARE_PROVIDER_SITE_OTHER): Payer: MEDICARE | Admitting: Internal Medicine

## 2010-12-04 DIAGNOSIS — J310 Chronic rhinitis: Secondary | ICD-10-CM

## 2010-12-04 DIAGNOSIS — N401 Enlarged prostate with lower urinary tract symptoms: Secondary | ICD-10-CM

## 2010-12-04 DIAGNOSIS — R259 Unspecified abnormal involuntary movements: Secondary | ICD-10-CM

## 2010-12-04 DIAGNOSIS — I10 Essential (primary) hypertension: Secondary | ICD-10-CM

## 2010-12-04 DIAGNOSIS — M25539 Pain in unspecified wrist: Secondary | ICD-10-CM

## 2010-12-04 MED ORDER — FLUTICASONE PROPIONATE 50 MCG/ACT NA SUSP: 2.0000 | Freq: Every day | NASAL | Status: DC

## 2010-12-04 NOTE — Progress Notes (Signed)
  Subjective:    Patient ID: [name], male    DOB: 1940/10/28, 70 y.o.   MRN: [id number]  HPI   The patient is here to follow up on chronic depression, anxiety symptoms controlled with medicines, BPH, OA controlled with meds, diet and exercise.   Review of Systems  Constitutional: Negative for activity change and fatigue.  HENT: Positive for postnasal drip. Negative for congestion and rhinorrhea.   Eyes: Negative for pain.  Respiratory: Positive for cough.   Cardiovascular: Negative for leg swelling.  Genitourinary: Negative for dysuria, flank pain and difficulty urinating.  Musculoskeletal: Positive for back pain, joint swelling and arthralgias. Negative for myalgias. Gait problem: wrists.  Neurological: Negative for tremors.  Psychiatric/Behavioral: Negative for suicidal ideas, hallucinations and dysphoric mood.       Objective:   Physical Exam  Constitutional: He is oriented to person, place, and time. He appears well-developed.  HENT:  Mouth/Throat: Oropharynx is clear and moist.  Eyes: Conjunctivae are normal. Pupils are equal, round, and reactive to light.  Neck: Normal range of motion. No JVD present. No thyromegaly present.  Cardiovascular: Normal rate, regular rhythm, normal heart sounds and intact distal pulses.  Exam reveals no gallop and no friction rub.   No murmur heard. Pulmonary/Chest: Effort normal and breath sounds normal. No respiratory distress. He has no wheezes. He has no rales. He exhibits no tenderness.  Abdominal: Soft. Bowel sounds are normal. He exhibits no distension and no mass. There is no tenderness. There is no rebound and no guarding.  Musculoskeletal: Normal range of motion. He exhibits edema and tenderness.  Lymphadenopathy:    He has no cervical adenopathy.  Neurological: He is alert and oriented to person, place, and time. He has normal reflexes. No cranial nerve deficit. He exhibits normal muscle tone. Coordination normal.  Skin:  Skin is warm and dry. No rash noted.  Psychiatric: He has a normal mood and affect. His behavior is normal. Judgment and thought content normal.          Assessment & Plan:  WRIST PAIN Cont with Ibuprofen  TREMOR Better  RHINITIS Worse. Restart Flonase.  HYPERTENSION Cont Rx  BENIGN PROSTATIC HYPERTROPHY, WITH URINARY OBSTRUCTION Better. Cont w/Flomax   OA - better  RADIOLOGY REPORT*  Clinical Data: Neck pain. left-sided weak grip  CERVICAL SPINE - 2-3 VIEW  Comparison: MRI 02/24/2008  Findings: Normal cervical alignment. Mild disc space narrowing  C5-6 and C6-7 are not well visualized due to overlying shoulders.  No fracture or acute bony abnormality.  IMPRESSION:  Cervical disc degeneration and spondylosis at C5-6 and C6-7 are not  well evaluated due to overlying shoulders. Consider follow-up MRI  if the patient has acute symptoms.

## 2010-12-04 NOTE — Assessment & Plan Note (Signed)
Better. Cont w/Flomax

## 2010-12-04 NOTE — Assessment & Plan Note (Signed)
Worse. Restart Flonase.

## 2010-12-04 NOTE — Assessment & Plan Note (Signed)
Better  

## 2010-12-04 NOTE — Assessment & Plan Note (Signed)
Cont with Ibuprofen

## 2010-12-04 NOTE — Assessment & Plan Note (Signed)
Cont Rx 

## 2011-01-10 NOTE — Assessment & Plan Note (Signed)
[facility]                             PRIMARY CARE OFFICE NOTE   NAME:SETTLEMYREGwenith [name]                  MRN:          [id number]  DATE:06/25/2006                            DOB:          1940-09-09    INTERNAL MEDICINE CONSULTATION   REQUESTED BY:  Danae Orleans. Venetia Maxon, M.D., Neurosurgery   REASON:  Medical clearance for L3, 4, and 5 fusion planned for December 4  this year.   ALLERGIES:  None.   PAST MEDICAL HISTORY:  As per September 16, 2002 note.   FAMILY HISTORY:  As per September 16, 2002 note.   SOCIAL HISTORY:  As per September 16, 2002 note. He is retired.   CURRENT MEDICINES:  Reviewed. New medicine is Cymbalta 60 mg [name].   REVIEW OF SYSTEMS:  Continues to ride his bike, however, much less due to  back pain, back continues to hurt. No urinary symptoms. No chest pain or  shortness of breath. No syncope. No neurologic complaints. He denies  depression, however, his wife states that he is less moody on the Cymbalta  (originally was given for carpal tunnel syndrome).   PHYSICAL EXAMINATION:  Blood pressure 152/89. Pulse 64. Temperature 97.5.  Weight 260 pounds. He is in no acute distress. Looks well.  HEENT: With moist mucosa. Neck supple. No thyromegaly or bruit. Small  oropharynx.  LUNGS:  Clear. No wheezing or rales.  HEART: S1, S2. No murmur. No gallop.  ABDOMEN: Soft and nontender. No organomegaly or mass felt.  EXTREMITIES: Lower extremities without edema.  NEURO: He is alert and cooperative. Denies being depressed.  SKIN: Clear.  RECTAL: Reveals prostate slightly enlarged, no nodules, no masses. Stool  guaiac negative.   LABS:  None available.   ASSESSMENT AND PLAN:  1. Preoperative clearance. I would like to obtain Cardiolite stress test      before we clear him for surgery. Obtain lab work, chest x-ray, EKG per      routine preop visit. Start a baby aspirin a day. If the above tests are      all acceptable, he should be  clear for spinal fusion. Pneumovax given.  2. Depressed mood. He will continue with Cymbalta 60 mg [name].  3. Chronic low back pain. Fusion L3, 4, 5 pending by Dr. Venetia Maxon.  4. Coronary artery disease. Cardiolite stress test to obtain as above.      Aspirin 81 mg [name].  5. Slight prostate enlargement. Obtain PSA.  6. Elevated blood pressure. Will see how he does on stress test. May need      to add a medication.  7. Dyslipidemia. He is on therapy, check lipids.    ______________________________  Georgina Quint [name], MD    AVP/MedQ  DD: 06/25/2006  DT: 06/25/2006  Job #: [id number]   cc:   Danae Orleans. Venetia Maxon, M.D.

## 2011-01-10 NOTE — Op Note (Signed)
[name], [name]           ACCOUNT NO.:  [id number]   MEDICAL RECORD NO.:  [id number]          PATIENT TYPE:  OIB   LOCATION:  2899                         FACILITY:  [facility]   PHYSICIAN:  Donalee Citrin, M.D.        DATE OF BIRTH:  [birth year]   DATE OF PROCEDURE:  03/27/2005  DATE OF DISCHARGE:                                 OPERATIVE REPORT   PREOPERATIVE DIAGNOSIS:  Left L5 radiculopathy from a large ruptured disk  with free fragment at L4-5 left compressing the left L5 nerve root.   POSTOPERATIVE DIAGNOSIS:  Left L5 radiculopathy from a large ruptured disk  with free fragment at L4-5 left compressing the left L5 nerve root.   OPERATION PERFORMED:  Lumbar laminectomy, microdiskectomy, L4-5 left,  microscopic __________ left L5 nerve root and microscopic diskectomy.   SURGEON:  Donalee Citrin, M.D.   ASSISTANT:  Reinaldo Meeker, M.D.   ANESTHESIA:  General endotracheal.   INDICATIONS FOR PROCEDURE:  The patient is a very pleasant 70 year old  gentleman who has had longstanding back and predominantly left leg pain  radiating down the front of his shin and the top of his foot to the great  toe.  Preoperative imaging showed a large ruptured disk with free fragment  migrating caudally compressing the 5 root against the pedicle.  The patient  __________ progressively worse with hip and leg pain.  Patient was  recommended laminectomy and microdiskectomy.  Risks and benefits were  explained to the patient.  He understands and agrees to proceed forward.   DESCRIPTION OF PROCEDURE:  The patient was brought to the operating room  where he was induced under general anesthesia.  Placed prone on a Wilson  frame.  Back was prepped and draped in the usual sterile fashion.  Preoperative x-ray localized the L4-5 disk space.  A midline incision made  after infiltration with 10 mL of lidocaine with epinephrine and Bovie  electrocautery was used to dissect down to the subcutaneous tissues.  Subperiosteal dissection was carried out to the lamina of L4 and L5 on the  left.  Then the high speed drill was used to align the inferior aspect of  the lamina to the superior aspect of the lamina of  L5, then intraoperative  x-ray confirmed the localization of the L4-5 disk space prior to this.  Then  using 3-4 mm Kerrison punches, punched the medial aspect lamina of L4 and  medial facet complex, superior aspect of lamina of L5 was removed to expose  the ligamentum flavum which was removed in piecemeal fashion exposing the  thecal sac.  At this point the operating microscope was draped and brought  into the field and under microscopic illumination, the proximal L5 nerve  root was identified.  The disk space was identified. The epidural veins were  coagulated.  Annulotomy was made of the disk space and a large disk bulge at  the level of the disk space was removed.  Then attention taken to exploring  underneath the 5 root.  Using a blunt nerve root and coronary dilator,  several large fragments of disk were  removed from underneath the 5 root,  migrating caudally behind the L5 body.  These were all removed in piecemeal  fashion decompressing the fibers of the thecal sac.  At the end of the  diskectomy, we removed several large fragments from behind the 5 body.  There was no further stenosis on the 5 root or the thecal sac.  The wound  was then copiously irrigated.  Meticulous hemostasis was maintained.  Gelfoam was overlaid on top of the dura.  The muscle and fascia  reapproximated with 0 interrupted Vicryl.  Subcutaneous tissue closed with 2-  0 interrupted Vicryl and the skin was closed with running 4-0 subcuticular.  Benzoin and Steri-Strips were applied.  The patient was then transferred to  the recovery room in stable condition.       GC/MEDQ  D:  03/27/2005  T:  03/27/2005  Job:  [id number]

## 2011-01-10 NOTE — Op Note (Signed)
[name], [name]           ACCOUNT NO.:  [id number]   MEDICAL RECORD NO.:  [id number]          PATIENT TYPE:  OIB   LOCATION:  2899                         FACILITY:  [facility]   PHYSICIAN:  Donalee Citrin, M.D.        DATE OF BIRTH:  02-01-1941   DATE OF PROCEDURE:  06/03/2004  DATE OF DISCHARGE:                                 OPERATIVE REPORT   PREOPERATIVE DIAGNOSIS:  Right L3-4 radiculopathy from lumbar spinal  stenosis at L3-4 with ruptured disk foraminal and extraforaminal at L3-4.   PROCEDURE:  Lumbar laminectomy and microdiskectomy with microscopic  dissection of the right L3 and L4 nerve root combined both intra and  extraforaminal approach.   SURGEON:  Donalee Citrin, M.D.   ASSISTANT:  Kathaleen Maser. Pool, M.D.   ANESTHESIA:  General endotracheal.   INDICATIONS FOR PROCEDURE:  The patient is a very pleasant 70 year old  gentleman who has had longstanding back and right leg pain refractory to all  forms of conservative treatment.  This was worst the last several months.  He reports the pain radiates down the outside of his right thigh,  occasionally below his knee into the anterior shin consistent with both L3  and L4 nerve root distribution.  Preoperative imaging showed severe spinal  stenosis at L3-4 with a combination of a foraminal and extraforaminal disk  rupture and facet arthropathy and lumbar spinal stenosis.  Due to the  patient failing conservative treatment, the patient was recommended  laminectomy and microdiskectomy with microscopic dissection of both the 3  and 4 roots.  I discussed all of the risks and benefits of surgery with him.  He understands and agreed to proceed forward.   DESCRIPTION OF PROCEDURE:  The patient was brought to the OR and induced  under general anesthesia.  The patient's lumbar region was prepped and  draped in the usual sterile fashion.  __________ the L3-4 disk space and  midline incision was made __________ Bovie electrocautery was used  to  dissect the subcutaneous tissue.  Subperiosteal dissection was carried out  on the lamina of L3 and L4.  Intraoperative x-ray confirmed localization of  the L4 TP so the TP above this was dissected free and working initially in  the extraforaminal space between the L4 and L4 TP, the superolateral facet  complex at L3-4 and medial aspect of the pars was drilled down and dissected  with 1 Penfield and underbitten to expose the ligament of the extraforaminal  space.  This was removed in a piecemeal fashion exposing the L3 nerve.  Then  disk was taken from the canal working from within the canal.  3 mm Kerrison  punches were used to remove the inferior aspect of lamina of L3, medial  aspect of facet complex, and superior aspect of lamina of L4 exposing  ligamentum flavum which was removed in piecemeal fashion.  The ligament was  noted to be markedly hypertrophied as well as the medial facet complex noted  to be markedly degenerated and had a grayish-yellow appearance and was  hypertrophied ligament and degenerative facet was all underbitten  decompressing the thecal  sac.  Then the proximal aspect of the L4 nerve root  was identified.  Then the operating microscope was brought into the field  and under microscopic illumination, the remainder of the L4 nerve root was  identified and decompressed off the neuroforamen.  The undersurface of the  gutter was underbitten from within the canal.  Then attention was taken back  to the extraforaminal space.  The L3 nerve root was again dissected free.  Some additional underbiting was obtained from the superolateral aspect of  the L3-4 facet complex, the medial aspect of the pars, and taking care not  to take too much of the pars off to make it incompetent.  Then the L3 nerve  root was dissected free with a 4 Penfield and was noted to be even markedly  compressed from a combination of osteophytic spur coming off the L3-4 disk  space and herniated disk.   Once the L3 nerve root was dissected off the disk  compartment, annulotomy was made in the disk space from the extraforaminal  compartment with 11 blade scalpel and pituitary rongeurs we used to remove  the anterior margin of the disk.  Then using a combination of coronary  dilator and a blunt nerve root, the undersurface of the L3 nerve distally  out the foramen was explored and several large fragments of disk were  removed from underneath the distal aspect of the nerve in the extraforaminal  space.  After this was all removed, the L3 nerve root was noted to be  markedly more mobile, then reflecting the L3 nerve root laterally,  osteophyte remover was used to remove the large posterior osteophyte coming  off the posterior aspect of the L3 vertebral body.  Once this was removed,  the L3 nerve root was completely decompressed.  Explored with a hockey stick  and coronary dilator and noted to be markedly decompressed.  Then attention  was taken back to within the canal.  Using 4 Penfield, the L4 nerve root was  then reflected medially.  Disk space was inspected.  This was noted to be  hard and not ruptured and so it was left alone.  Some additional gutter was  underbitten creating more room in the lateral gutter.  Then the L4 nerve  root was explored with a hockey stick and coronary dilator and noted to be  markedly decompressed as well.  Then the wound was copiously irrigated and  meticulous hemostasis was maintained.  Gelfoam was overlaid over top of the  dura both within the canal and extraforaminal space.  Then self-retaining  retractor was placed.  Meticulous hemostasis was maintained.  0 Vicryl was  inserted in the fascia and muscle, subcutaneous tissue closed with 2-0  undyed Vicryl, and the skin was closed with running 4-0 subcuticular.  Benzoin and Steri-Strips were applied.  The patient was taken to the  recovery room in stable condition.  At the end of the case, needle, sponge, and  instrument counts correct.       GC/MEDQ  D:  06/03/2004  T:  06/04/2004  Job:  [id number]

## 2011-01-10 NOTE — Op Note (Signed)
[name], [name]           ACCOUNT NO.:  [id number]   MEDICAL RECORD NO.:  [id number]          PATIENT TYPE:  INP   LOCATION:  3315                         FACILITY:  [facility]   PHYSICIAN:  Danae Orleans. Venetia Maxon, M.D.  DATE OF BIRTH:  31-Dec-1940   DATE OF PROCEDURE:  07/28/2006  DATE OF DISCHARGE:                               OPERATIVE REPORT   PREOPERATIVE DIAGNOSIS:  Recurrent herniated lumbar disk with  spondylosis,degenerative disk disease, stenosis and radiculopathy L3-4,  L4-5, L5-S1 levels.   POSTOPERATIVE DIAGNOSIS:  Recurrent herniated lumbar disk with  spondylosis,degenerative disk disease, stenosis and radiculopathy L3-4,  L4-5, L5-S1 levels.   PROCEDURES:  Redo laminectomy L3-4, L4-5, L5-S1 levels with the  posterior lumbar interbody fusion with cages (11-mm PEEK cages at L3-4  and 17-mm cages at L5-S1 with 14 mm TLIF cage at L4-5 with pedicle screw  fixation bilaterally L3 through S1 levels with posterolateral  arthrodesis using a morcellized bone autograft and Osteocel.   SURGEON:  Danae Orleans. Venetia Maxon, M.D.   ASSISTANT:  Stefani Dama, M.D.   ANESTHESIA:  Georgiann Cocker, RN   ANESTHESIA:  General endotracheal anesthesia.   ESTIMATED BLOOD LOSS:  1300 mL.  800 mL Cell Saver blood returned to the  patient.   COMPLICATIONS:  None.   DISPOSITION:  Recovery.   INDICATIONS:  [name] is a 70 year old man who has previously  undergone multilevel laminectomies for herniated disk by another  Careers adviser.  He has recurrent disk herniations with significant bilateral  lower extremity pain and significant nerve root compression. It was  elected to take him to surgery for redo decompression and fusion L3  through S1 levels.   PROCEDURE:  Mr. Wirt was brought to the operating room.  Following  satisfactory and uncomplicated of general endotracheal anesthesia  placement intravenous lines and Foley catheter, he was placed in a prone  position on the [place]  table.  His low back was prepped and draped in  the usual sterile fashion.  Area of planned incision was infiltrated  with quarter percent Marcaine and 0.5% lidocaine with 1:200,000  epinephrine.  Incision was made through his previous incision extended  cephalad and caudad over the midline and carried to the transverse  processes of L3, L4, L5 and sacral alae bilaterally.  Self-retaining  retractor was placed and intraoperative x-ray confirmed correct  orientation.  Total laminectomy of L5 and L4 was performed and redo  laminectomy L3-4, L4-5 was performed bilaterally as was laminectomy of  L5-S1 bilaterally.  The diskectomies were performed bilaterally at L5-  S1, L4-5 and L3-4 levels.  After trial sizing PEEK interbody cages were  selected.  Insert rotate cages were selected and 11 mm cages were placed  at L3-4 after the inner endplates were prepared aggressively for  grafting and 17 mm cages were placed the L5-S1 level.  Because of  previous laminotomy at L4-5 on the left it was not possible to retract  the nerve root sufficiently to the midline to be able to put in the  appropriately sized cage and consequently was elected to place TLIF cage  from the right side.  A 32 mm x 14 mm TLIF cage was then packed with  morcellized bone autograft inserted in the interspace and countersunk  appropriately.  Positioning of the cages was confirmed on lateral AP and  lateral fluoroscopy and additional bone autograft mixed with Osteocel  was then placed in the interspace and countersunk appropriately.  Pedicle screws were then placed at S1 through L3 levels.  Sacral  Alphatec sacral 58 x 6.5-mm screws were placed at the sacrum and 50-mm x  6.5-mm screws were placed L5 through L3 levels.  The screws had  excellent positioning and their positioning was confirmed on  intraoperative x-ray using fluoroscopy.  No evidence of any cutouts at  any level.  The AP and lateral fluoroscopy demonstrated  well-positioned  cages and pedicle screw fixation.  Remaining bone autograft was then  placed in the posterolateral region which had been decorticated prior to  placing the screws.  90-mm rods were placed overlying the screw heads.  These were locked down in situ.  The wound had been irrigated prior to  placing bone graft.  The torque wrench appropriately tightened L3-S1  screws.  The self-retaining retractor was then removed, the hemostasis  was assured.  The lumbodorsal fascia was closed with 1 Vicryl sutures  and subcutaneous tissues were approximated 2-0 Vicryl interrupted  inverted sutures and skin edges were approximated interrupted 3-0 Vicryl  subcuticular stitch.  The wound was dressed with Benzoin, Steri-Strips,  Telfa gauze and tape.  The patient was extubated in the operating room,  taken to recovery room in stable satisfactory having tolerated operation  well.  Counts correct at end of the case.      Danae Orleans. Venetia Maxon, M.D.  Electronically Signed     JDS/MEDQ  D:  07/28/2006  T:  07/29/2006  Job:  [id number]

## 2011-01-10 NOTE — Discharge Summary (Signed)
NAMEAYDRIAN, HALPIN           ACCOUNT NO.:  [id number]   MEDICAL RECORD NO.:  [id number]          PATIENT TYPE:  INP   LOCATION:  3020                         FACILITY:  [facility]   PHYSICIAN:  Danae Orleans. Venetia Maxon, M.D.  DATE OF BIRTH:  1941/04/18   DATE OF ADMISSION:  07/28/2006  DATE OF DISCHARGE:  07/31/2006                               DISCHARGE SUMMARY   REASON FOR ADMISSION:  1. Lumbosacral spondylosis with lumbar radiculopathy.  2. Lumbosacral arthropathy.  3. Prior diskectomy.   FINAL DIAGNOSES:  1. Lumbosacral spondylosis with lumbar radiculopathy.  2. Lumbosacral arthropathy.  3. Prior diskectomy.   HISTORY OF ILLNESS AND HOSPITAL COURSE:  [name] is a 70-year-  old man who has previously undergone decompressive lumbar surgery by  other surgeon with the recurrence of disk herniation and significant low  back pain.  It was elected to take him to surgery for redo laminectomy  and decompression, L3-4, L4-5, L5-S1 levels.  The patient tolerated  procedure well and was having less pain in his legs after surgery and  gradually mobilized.  He did have some drainage from his incision, but  this improved the day after surgery.  On the 7th, he was doing well, had  relief of his back and leg pain, was mobilized and taking Vicodin and  Valium.   FOLLOWUP INSTRUCTIONS:  He will be followed up in the office in 3 weeks  with lumbar radiographs.   DISCHARGE INSTRUCTIONS:  To take it easy at home, to wear a brace when  up.      Danae Orleans. Venetia Maxon, M.D.  Electronically Signed     JDS/MEDQ  D:  09/04/2006  T:  09/04/2006  Job:  [id number]

## 2011-01-10 NOTE — Discharge Summary (Signed)
[place]. [facility]  Patient:    [name], [name] Visit Number: [id number] MRN: [id number]          Service Type: SUR Location: 5000 5024 01 Attending Physician:  Twana First Dictated by:   Julien Girt, P.A.C. Admit Date:  09/03/2001 Discharge Date: 09/07/2001                             Discharge Summary  ADMISSION DIAGNOSIS:  End-stage degenerative joint disease right knee.  DISCHARGE DIAGNOSIS:  End-stage degenerative joint disease right knee.  HISTORY OF PRESENT ILLNESS:  The patient is a 70 year old white male with history of DJD of both knees. He has tried conservative treatment and anti-inflammatories.  At this point in time, he has pain at night, pain with rest and pain with activity with his right knee.  We discussed risks, benefits, and possible complications of surgery and he is without question.  PROCEDURES IN HOUSE:  On September 03, 2001, the patient underwent a right total knee.  He tolerated the procedure well.  HOSPITAL COURSE:  Postop day #1, the patient was alert and oriented, T-max of 101.0.  Surgical wounds are well-approximated. His Hemovac was removed.  His hemoglobin was 11.1.  INR was 1.3 and he was metabolically stable. His epidural was doing well.  On postop day #2, INR was 1.4.  Epidural was discontinued.  T-max was 100.6.  Surgical wound was well-approximated.  On postop day #3, the patient was doing well except for a CPM where he had difficulty with range of motion.  T-max was 101.4. Hemoglobin 10.1.  INR 1.4. Surgical wound was well-approximated.  Postop day #4, surgical wound was well-approximated, T-max was 100, range of motion was -5 to 70 degrees. Hemoglobin was 9.3.  INR was 1.5.  He was discharged to home in stable condition with a home health physical therapy and R.N. as well as a CPM, a three-in-one walker and a tub bench.  DISCHARGE MEDICATIONS: 1. Percocet one to two q.4-6h. p.r.n. pain. 2.  Colace 100 mg one p.o. b.i.d. 3. Coumadin.  FOLLOW-UP:  We will see him back in the office in two weeks or sooner if he has problems. Dictated by:   Julien Girt, P.A.C. Attending Physician:  Twana First DD:  10/05/01 TD:  10/05/01 Job: [id number]

## 2011-01-10 NOTE — Op Note (Signed)
[place]. [facility]  Patient:    BRANNAN, CASSEDY Visit Number: [id number] MRN: [id number]          Service Type: SUR Location: 5000 5024 01 Attending Physician:  Twana First Dictated by:   Elana Alm Thurston Hole, M.D. Proc. Date: 09/03/01 Admit Date:  09/03/2001                             Operative Report  PREOPERATIVE DIAGNOSIS:  Right knee degenerative joint disease.  POSTOPERATIVE DIAGNOSIS:  Right knee degenerative joint disease.  PROCEDURE:  Right total knee replacement using Osteonics Scorpio total knee system with #11 cemented femoral component, #13 cemented tibial component, with 12 mm polyethylene tibial spacer and 28 mm polyethylene cemented patella.  SURGEON:  Elana Alm. Thurston Hole, M.D.  ASSISTANT:  Julien Girt, P.A.  ANESTHESIA:  General.  OPERATIVE TIME:  One hour 50 minutes.  TOURNIQUET TIME:  One hour 40 minutes.  COMPLICATIONS:  None.  DESCRIPTION:  Mr. Dannial Monarch was brought to the operating room on September 03, 2001, placed on the operative table in supine position.  After an adequate level of general anesthesia was obtained he had a Foley catheter placed under sterile conditions and received Ancef 1 g IV preoperatively for prophylaxis. His right knee was examined under anesthesia.  Range of motion from -10 to 95 degrees, knee stable to ligamentous exam with normal patella tracking.  The right leg was prepped using sterile Betadine and draped using sterile technique.  The leg was exsanguinated and thigh tourniquet elevated at 375 mm.  Initially through a 20 cm longitudinal incision based over the patella, initial exposure was made.  The underlying subcutaneous tissues were incised in line with the skin incision.  A median arthrotomy was performed revealing an excessive amount of normal-appearing joint fluid.  The articular surfaces were inspected.  He had grade 4 changes medially and laterally and in  the patellofemoral joint.  Osteophytes were removed from the femoral condyles and the tibial plateau.  The medial and lateral meniscal remnants were removed as well as the anterior cruciate ligament.  An intermedullary drill was then drilled up the femoral canal for placement of the distal femoral cutting jig and this was placed in the appropriate amount of rotation and then a distal 12 mm cut was made.  After this was done, the distal femur was sized.  An 11 was found to be the appropriate size and then the #11 cutting jig was placed and then these cuts were made.  After this was done, then the proximal tibia was exposed.  The tibial spines were removed with an oscillating saw. Intermedullary drill drilled down the tibial canal for placement of the proximal tibial cutting jig.  It was placed in the appropriate amount of rotation and then an 8 mm cut was made based on the lateral or lower side. After this was done the cut surface was tested for the size of the tibial tray and a 13 was found to be the appropriate size.  At this point, then the Scorpio PCL cutter was placed back on the distal femur and these cuts were made.  Loose bodies were then carefully removed from the posterior aspect of the knee and a posterior capsular release carefully undertaken as well, posteromedially and posterolaterally.  After this was done, then the #11 femoral trial component was hammered into position, the #13 tibial base plate with a 12 mm polyethylene spacer  was placed.  This was found to give excellent stability, excellent range of motion 0 to 120 degrees with no lift-off on the tray.  The tibial tray was then marked for rotation and the keel cut was made. After this was done, the patella was sized.  A 28 mm was found to be the appropriate size and a recessed 10 mm x 28 mm cut was made and three locking holes placed.  At this point it was felt that all the trial components were of excellent size, fit, and  stability.  The knee was then jet lavage irrigated with 3 liters of saline solution.  The proximal tibia was then exposed and the #13 base plate with cement backing was hammered into position with an excellent fit, with excess cement being removed from around the edges.  Then a #11 femoral component with cement backing was hammered into position, also with an excellent fit, with excess cement being removed from around the edges. The 12 mm polyethylene spacer was then locked on the tibial base plate, the knee taken through a range of motion, 0 to 120 degrees with excellent stability and no lift-off on the tray.  The 28 mm patella was locked into its recessed hole and held there with a clamp.  After the cement hardened, patellofemoral tracking was evaluated.  This was found to be normal with no need for a lateral release.  At this point the tourniquet was lowered. Hemostasis in the synovial tissues was obtained with cautery but no excessive bleeding was encountered.  The wound was copiously irrigated.  The arthrotomy was closed with #1 Ethibond suture over two median Hemovac drains.  The subcutaneous tissues were closed with 0 and 2-0 Vicryl, skin closed with skin staples, sterile dressings were applied, and the Hemovac injected with 0.25% Marcaine with epinephrine and then clamped.  Sterile dressings and a splint were applied.  Neurovascular checks were found to normal, pulses 2+ and symmetric.  The patient was then turned lateral where an epidural catheter was placed by anesthesia for postoperative pain control.  He was then awakened and taken to recovery room in a stable condition.  Needle and sponge counts correct x 2 at the end of the case. Dictated by:   Elana Alm Thurston Hole, M.D. Attending Physician:  Twana First DD:  09/03/01 TD:  09/03/01 Job: [id number]

## 2011-04-10 ENCOUNTER — Encounter: Payer: Self-pay | Admitting: Internal Medicine

## 2011-04-10 ENCOUNTER — Ambulatory Visit (INDEPENDENT_AMBULATORY_CARE_PROVIDER_SITE_OTHER): Payer: Medicare Other | Admitting: Internal Medicine

## 2011-04-10 DIAGNOSIS — M25539 Pain in unspecified wrist: Secondary | ICD-10-CM

## 2011-04-10 DIAGNOSIS — M545 Low back pain, unspecified: Secondary | ICD-10-CM

## 2011-04-10 DIAGNOSIS — M1990 Unspecified osteoarthritis, unspecified site: Secondary | ICD-10-CM

## 2011-04-10 DIAGNOSIS — I2510 Atherosclerotic heart disease of native coronary artery without angina pectoris: Secondary | ICD-10-CM

## 2011-04-10 DIAGNOSIS — R259 Unspecified abnormal involuntary movements: Secondary | ICD-10-CM

## 2011-04-10 MED ORDER — DULOXETINE HCL 60 MG PO CPEP: 60.0000 mg | ORAL_CAPSULE | Freq: Every day | ORAL | Status: DC

## 2011-04-10 MED ORDER — SIMVASTATIN 40 MG PO TABS: 40.0000 mg | ORAL_TABLET | Freq: Every day | ORAL | Status: DC

## 2011-04-10 NOTE — Assessment & Plan Note (Signed)
Discussed. Rx prn

## 2011-04-10 NOTE — Assessment & Plan Note (Signed)
Watching  

## 2011-04-10 NOTE — Assessment & Plan Note (Signed)
On Rx 

## 2011-04-10 NOTE — Progress Notes (Signed)
  Subjective:    Patient ID: [name], male    DOB: 02/24/1941, 70 y.o.   MRN: [id number]  HPI  The patient presents for a follow-up of  chronic CAD, chronic dyslipidemia, BPH controlled with medicines    Review of Systems  Constitutional: Negative for appetite change, fatigue and unexpected weight change.  HENT: Negative for nosebleeds, congestion, sore throat, sneezing, trouble swallowing and neck pain.   Eyes: Negative for itching and visual disturbance.  Respiratory: Negative for cough.   Cardiovascular: Negative for chest pain, palpitations and leg swelling.  Gastrointestinal: Negative for nausea, diarrhea, blood in stool and abdominal distention.  Genitourinary: Negative for frequency and hematuria.  Musculoskeletal: Positive for arthralgias. Negative for back pain, joint swelling and gait problem.  Skin: Negative for rash.  Neurological: Negative for dizziness, tremors, speech difficulty and weakness.  Psychiatric/Behavioral: Negative for sleep disturbance, dysphoric mood and agitation. The patient is not nervous/anxious.        Objective:   Physical Exam  Constitutional: He is oriented to person, place, and time. He appears well-developed.  HENT:  Mouth/Throat: Oropharynx is clear and moist.  Eyes: Conjunctivae are normal. Pupils are equal, round, and reactive to light.  Neck: Normal range of motion. No JVD present. No thyromegaly present.  Cardiovascular: Normal rate, regular rhythm, normal heart sounds and intact distal pulses.  Exam reveals no gallop and no friction rub.   No murmur heard. Pulmonary/Chest: Effort normal and breath sounds normal. No respiratory distress. He has no wheezes. He has no rales. He exhibits no tenderness.  Abdominal: Soft. Bowel sounds are normal. He exhibits no distension and no mass. There is no tenderness. There is no rebound and no guarding.  Musculoskeletal: Normal range of motion. He exhibits edema (R wrist is swollen, tender).  He exhibits no tenderness.  Lymphadenopathy:    He has no cervical adenopathy.  Neurological: He is alert and oriented to person, place, and time. He has normal reflexes. No cranial nerve deficit. He exhibits normal muscle tone. Coordination normal.  Skin: Skin is warm and dry. No rash noted.  Psychiatric: He has a normal mood and affect. His behavior is normal. Judgment and thought content normal.          Assessment & Plan:

## 2011-08-11 ENCOUNTER — Ambulatory Visit: Payer: Medicare Other | Admitting: Internal Medicine

## 2011-10-01 ENCOUNTER — Ambulatory Visit (INDEPENDENT_AMBULATORY_CARE_PROVIDER_SITE_OTHER): Payer: Medicare Other | Admitting: Internal Medicine

## 2011-10-01 ENCOUNTER — Encounter: Payer: Self-pay | Admitting: Internal Medicine

## 2011-10-01 DIAGNOSIS — M25531 Pain in right wrist: Secondary | ICD-10-CM

## 2011-10-01 DIAGNOSIS — M545 Low back pain, unspecified: Secondary | ICD-10-CM

## 2011-10-01 DIAGNOSIS — E785 Hyperlipidemia, unspecified: Secondary | ICD-10-CM

## 2011-10-01 DIAGNOSIS — I2510 Atherosclerotic heart disease of native coronary artery without angina pectoris: Secondary | ICD-10-CM

## 2011-10-01 DIAGNOSIS — M25539 Pain in unspecified wrist: Secondary | ICD-10-CM

## 2011-10-01 DIAGNOSIS — I10 Essential (primary) hypertension: Secondary | ICD-10-CM

## 2011-10-01 MED ORDER — FLUTICASONE PROPIONATE 50 MCG/ACT NA SUSP: 2.0000 | Freq: Every day | NASAL | Status: DC

## 2011-10-01 MED ORDER — DICLOFENAC SODIUM 1 % TD GEL: 1.0000 "application " | Freq: Four times a day (QID) | TRANSDERMAL | Status: DC

## 2011-10-01 MED ORDER — TRIAMCINOLONE ACETONIDE 0.5 % EX OINT: TOPICAL_OINTMENT | Freq: Two times a day (BID) | CUTANEOUS | Status: AC

## 2011-10-01 NOTE — Progress Notes (Signed)
  Subjective:    Patient ID: [name], male    DOB: [birth year], 71 y.o.   MRN: [id number]  HPI  The patient presents for a follow-up of  chronic hypertension, chronic dyslipidemia, CAD controlled with medicines C/o R wrist pain - R thumb - thumb suspension surgery s/p   Review of Systems  Constitutional: Negative for appetite change, fatigue and unexpected weight change.  HENT: Negative for nosebleeds, congestion, sore throat, sneezing, trouble swallowing and neck pain.   Eyes: Negative for itching and visual disturbance.  Respiratory: Negative for cough.   Cardiovascular: Negative for chest pain, palpitations and leg swelling.  Gastrointestinal: Negative for nausea, diarrhea, blood in stool and abdominal distention.  Genitourinary: Negative for frequency and hematuria.  Musculoskeletal: Positive for back pain and arthralgias. Negative for joint swelling and gait problem.  Skin: Negative for rash.  Neurological: Negative for dizziness, tremors, speech difficulty and weakness.  Psychiatric/Behavioral: Negative for sleep disturbance, dysphoric mood and agitation. The patient is not nervous/anxious.        Objective:   Physical Exam  Constitutional: He is oriented to person, place, and time. He appears well-developed.  HENT:  Mouth/Throat: Oropharynx is clear and moist.  Eyes: Conjunctivae are normal. Pupils are equal, round, and reactive to light.  Neck: Normal range of motion. No JVD present. No thyromegaly present.  Cardiovascular: Normal rate, regular rhythm, normal heart sounds and intact distal pulses.  Exam reveals no gallop and no friction rub.   No murmur heard. Pulmonary/Chest: Effort normal and breath sounds normal. No respiratory distress. He has no wheezes. He has no rales. He exhibits no tenderness.  Abdominal: Soft. Bowel sounds are normal. He exhibits no distension and no mass. There is no tenderness. There is no rebound and no guarding.  Musculoskeletal:  Normal range of motion. He exhibits tenderness (R radial wrist is tender). He exhibits no edema.  Lymphadenopathy:    He has no cervical adenopathy.  Neurological: He is alert and oriented to person, place, and time. He has normal reflexes. No cranial nerve deficit. He exhibits normal muscle tone. Coordination normal.  Skin: Skin is warm and dry. No rash noted.  Psychiatric: He has a normal mood and affect. His behavior is normal. Judgment and thought content normal.    R wrist is tender  Lab Results  Component Value Date   WBC 4.4* 07/11/2010   HGB 13.7 07/11/2010   HCT 40.8 07/11/2010   PLT 180.0 07/11/2010   GLUCOSE 99 07/11/2010   CHOL 137 07/11/2010   TRIG 76.0 07/11/2010   HDL 43.60 07/11/2010   LDLDIRECT 157.5 07/05/2008   LDLCALC 78 07/11/2010   ALT 24 07/11/2010   AST 27 07/11/2010   NA 141 07/11/2010   K 4.9 07/11/2010   CL 101 07/11/2010   CREATININE 0.8 07/11/2010   BUN 17 07/11/2010   CO2 29 07/11/2010   TSH 0.58 07/11/2010   PSA 0.46 07/11/2010       Assessment & Plan:

## 2011-10-04 ENCOUNTER — Telehealth: Payer: Self-pay | Admitting: *Deleted

## 2011-10-04 NOTE — Assessment & Plan Note (Signed)
Continue with current prescription therapy as reflected on the Med list.  

## 2011-10-04 NOTE — Assessment & Plan Note (Signed)
R wrist pain - R thumb - thumb suspension surgery s/p F/u w/ortho

## 2011-10-04 NOTE — Telephone Encounter (Signed)
Message copied by Merrilyn Puma on Sat Oct 04, 2011 12:18 PM ------      Message from: Janeal Holmes      Created: Sat Oct 04, 2011  9:38 AM       Misty Stanley, please, inform patient that I can't inject his wrist due to a "new anatomy" created by surgery. He shoul f/u w/ortho.      Thx

## 2011-10-04 NOTE — Telephone Encounter (Signed)
Left mess for patient to call back.  

## 2011-10-06 NOTE — Telephone Encounter (Signed)
Pts wife informed.

## 2011-11-10 ENCOUNTER — Other Ambulatory Visit: Payer: Self-pay | Admitting: Internal Medicine

## 2012-01-01 ENCOUNTER — Encounter: Payer: Medicare Other | Admitting: Internal Medicine

## 2012-01-07 ENCOUNTER — Encounter (HOSPITAL_BASED_OUTPATIENT_CLINIC_OR_DEPARTMENT_OTHER): Payer: Self-pay | Admitting: *Deleted

## 2012-01-08 ENCOUNTER — Encounter (HOSPITAL_BASED_OUTPATIENT_CLINIC_OR_DEPARTMENT_OTHER): Payer: Self-pay | Admitting: *Deleted

## 2012-01-08 NOTE — Progress Notes (Signed)
No labs needed

## 2012-01-12 ENCOUNTER — Other Ambulatory Visit: Payer: Self-pay | Admitting: Orthopedic Surgery

## 2012-01-14 ENCOUNTER — Encounter (HOSPITAL_BASED_OUTPATIENT_CLINIC_OR_DEPARTMENT_OTHER): Payer: Self-pay | Admitting: Certified Registered Nurse Anesthetist

## 2012-01-14 ENCOUNTER — Ambulatory Visit (HOSPITAL_BASED_OUTPATIENT_CLINIC_OR_DEPARTMENT_OTHER)
Admission: RE | Admit: 2012-01-14 | Discharge: 2012-01-14 | Disposition: A | Discharge status: Home / Self Care | Payer: Medicare Other | Source: Ambulatory Visit | Attending: Orthopedic Surgery | Admitting: Orthopedic Surgery

## 2012-01-14 ENCOUNTER — Ambulatory Visit (HOSPITAL_BASED_OUTPATIENT_CLINIC_OR_DEPARTMENT_OTHER): Payer: Medicare Other | Admitting: Certified Registered Nurse Anesthetist

## 2012-01-14 ENCOUNTER — Encounter (HOSPITAL_BASED_OUTPATIENT_CLINIC_OR_DEPARTMENT_OTHER): Payer: Self-pay | Admitting: Orthopedic Surgery

## 2012-01-14 ENCOUNTER — Encounter (HOSPITAL_BASED_OUTPATIENT_CLINIC_OR_DEPARTMENT_OTHER)
Admission: RE | Disposition: A | Discharge status: Home / Self Care | Payer: Self-pay | Source: Ambulatory Visit | Attending: Orthopedic Surgery

## 2012-01-14 DIAGNOSIS — G4730 Sleep apnea, unspecified: Secondary | ICD-10-CM | POA: Insufficient documentation

## 2012-01-14 DIAGNOSIS — I2510 Atherosclerotic heart disease of native coronary artery without angina pectoris: Secondary | ICD-10-CM | POA: Insufficient documentation

## 2012-01-14 DIAGNOSIS — I10 Essential (primary) hypertension: Secondary | ICD-10-CM | POA: Insufficient documentation

## 2012-01-14 DIAGNOSIS — M19039 Primary osteoarthritis, unspecified wrist: Secondary | ICD-10-CM | POA: Insufficient documentation

## 2012-01-14 DIAGNOSIS — M249 Joint derangement, unspecified: Secondary | ICD-10-CM | POA: Insufficient documentation

## 2012-01-14 HISTORY — DX: Benign prostatic hyperplasia without lower urinary tract symptoms: N40.0

## 2012-01-14 LAB — POCT HEMOGLOBIN-HEMACUE: Hemoglobin: 15.9 g/dL (ref 13.0–17.0)

## 2012-01-14 SURGERY — CARPECTOMY WITH RADIAL STYLOIDECTOMY
Anesthesia: General | Site: Wrist | Laterality: Right | Wound class: Clean

## 2012-01-14 MED ORDER — CEFAZOLIN SODIUM 1-5 GM-% IV SOLN: 1.0000 g | Freq: Once | INTRAVENOUS | Status: DC

## 2012-01-14 MED ORDER — MIDAZOLAM HCL 2 MG/2ML IJ SOLN
0.5000 mg | INTRAMUSCULAR | Status: DC | PRN
  Administered 2012-01-14: 2 mg via INTRAVENOUS

## 2012-01-14 MED ORDER — FENTANYL CITRATE 0.05 MG/ML IJ SOLN: 25.0000 ug | INTRAMUSCULAR | Status: DC | PRN

## 2012-01-14 MED ORDER — OXYCODONE-ACETAMINOPHEN 7.5-500 MG PO TABS: 1.0000 | ORAL_TABLET | ORAL | Status: AC | PRN

## 2012-01-14 MED ORDER — LIDOCAINE HCL 1 % IJ SOLN
INTRAMUSCULAR | Status: DC | PRN
  Administered 2012-01-14: 2 mL via INTRADERMAL

## 2012-01-14 MED ORDER — ROPIVACAINE HCL 5 MG/ML IJ SOLN
INTRAMUSCULAR | Status: DC | PRN
  Administered 2012-01-14: 30 mL via EPIDURAL

## 2012-01-14 MED ORDER — METOCLOPRAMIDE HCL 5 MG/ML IJ SOLN
INTRAMUSCULAR | Status: DC | PRN
  Administered 2012-01-14: 10 mg via INTRAVENOUS

## 2012-01-14 MED ORDER — LACTATED RINGERS IV SOLN
INTRAVENOUS | Status: DC
  Administered 2012-01-14 (×2): via INTRAVENOUS

## 2012-01-14 MED ORDER — FENTANYL CITRATE 0.05 MG/ML IJ SOLN
50.0000 ug | INTRAMUSCULAR | Status: DC | PRN
  Administered 2012-01-14: 100 ug via INTRAVENOUS

## 2012-01-14 MED ORDER — DEXAMETHASONE SODIUM PHOSPHATE 10 MG/ML IJ SOLN
INTRAMUSCULAR | Status: DC | PRN
  Administered 2012-01-14: 10 mg via INTRAVENOUS

## 2012-01-14 MED ORDER — CEFAZOLIN SODIUM-DEXTROSE 2-3 GM-% IV SOLR
2.0000 g | Freq: Once | INTRAVENOUS | Status: AC
  Administered 2012-01-14: 2 g via INTRAVENOUS

## 2012-01-14 MED ORDER — CHLORHEXIDINE GLUCONATE 4 % EX LIQD: 60.0000 mL | Freq: Once | CUTANEOUS | Status: DC

## 2012-01-14 MED ORDER — METOCLOPRAMIDE HCL 5 MG/ML IJ SOLN: 10.0000 mg | Freq: Once | INTRAMUSCULAR | Status: DC | PRN

## 2012-01-14 MED ORDER — PROPOFOL 10 MG/ML IV EMUL
INTRAVENOUS | Status: DC | PRN
  Administered 2012-01-14: 200 mg via INTRAVENOUS

## 2012-01-14 MED ORDER — ONDANSETRON HCL 4 MG/2ML IJ SOLN
INTRAMUSCULAR | Status: DC | PRN
  Administered 2012-01-14: 4 mg via INTRAVENOUS

## 2012-01-14 MED ORDER — LIDOCAINE HCL (CARDIAC) 20 MG/ML IV SOLN
INTRAVENOUS | Status: DC | PRN
  Administered 2012-01-14: 50 mg via INTRAVENOUS

## 2012-01-14 SURGICAL SUPPLY — 55 items
BANDAGE GAUZE ELAST BULKY 4 IN (GAUZE/BANDAGES/DRESSINGS) ×2 IMPLANT
BLADE ARTHRO LOK 4 BEAVER (BLADE) ×2 IMPLANT
BLADE EAR TYMPAN 2.5 60D BEAV (BLADE) IMPLANT
BLADE MINI RND TIP GREEN BEAV (BLADE) ×2 IMPLANT
BLADE SURG 15 STRL LF DISP TIS (BLADE) ×1 IMPLANT
BLADE SURG 15 STRL SS (BLADE) ×2
BNDG CMPR 9X4 STRL LF SNTH (GAUZE/BANDAGES/DRESSINGS) ×1
BNDG COHESIVE 3X5 TAN STRL LF (GAUZE/BANDAGES/DRESSINGS) ×2 IMPLANT
BNDG ESMARK 4X9 LF (GAUZE/BANDAGES/DRESSINGS) ×2 IMPLANT
CHLORAPREP W/TINT 26ML (MISCELLANEOUS) ×2 IMPLANT
CLOTH BEACON ORANGE TIMEOUT ST (SAFETY) ×2 IMPLANT
CORDS BIPOLAR (ELECTRODE) ×2 IMPLANT
COVER MAYO STAND STRL (DRAPES) ×2 IMPLANT
COVER TABLE BACK 60X90 (DRAPES) ×2 IMPLANT
CUFF TOURNIQUET SINGLE 18IN (TOURNIQUET CUFF) ×2 IMPLANT
DECANTER SPIKE VIAL GLASS SM (MISCELLANEOUS) IMPLANT
DRAPE EXTREMITY T 121X128X90 (DRAPE) ×2 IMPLANT
DRAPE OEC MINIVIEW 54X84 (DRAPES) ×2 IMPLANT
DRAPE SURG 17X23 STRL (DRAPES) ×2 IMPLANT
GAUZE XEROFORM 1X8 LF (GAUZE/BANDAGES/DRESSINGS) ×2 IMPLANT
GLOVE BIO SURGEON STRL SZ 6.5 (GLOVE) ×2 IMPLANT
GLOVE BIOGEL PI IND STRL 8.5 (GLOVE) ×1 IMPLANT
GLOVE BIOGEL PI INDICATOR 8.5 (GLOVE) ×1
GLOVE EXAM NITRILE EXT CUFF MD (GLOVE) ×2 IMPLANT
GLOVE SURG ORTHO 8.0 STRL STRW (GLOVE) ×2 IMPLANT
GLOVE SURG SS PI 7.0 STRL IVOR (GLOVE) ×2 IMPLANT
GLOVE SURG SS PI 8.5 STRL IVOR (GLOVE) ×1
GLOVE SURG SS PI 8.5 STRL STRW (GLOVE) ×1 IMPLANT
GOWN BRE IMP PREV XXLGXLNG (GOWN DISPOSABLE) ×2 IMPLANT
GOWN PREVENTION PLUS XLARGE (GOWN DISPOSABLE) ×2 IMPLANT
NS IRRIG 1000ML POUR BTL (IV SOLUTION) ×2 IMPLANT
PACK BASIN DAY SURGERY FS (CUSTOM PROCEDURE TRAY) ×2 IMPLANT
PAD CAST 3X4 CTTN HI CHSV (CAST SUPPLIES) ×1 IMPLANT
PADDING CAST ABS 3INX4YD NS (CAST SUPPLIES)
PADDING CAST ABS 4INX4YD NS (CAST SUPPLIES) ×1
PADDING CAST ABS COTTON 3X4 (CAST SUPPLIES) IMPLANT
PADDING CAST ABS COTTON 4X4 ST (CAST SUPPLIES) ×1 IMPLANT
PADDING CAST COTTON 3X4 STRL (CAST SUPPLIES) ×2
SLEEVE SCD COMPRESS KNEE MED (MISCELLANEOUS) ×2 IMPLANT
SPLINT PLASTER CAST XFAST 3X15 (CAST SUPPLIES) ×15 IMPLANT
SPLINT PLASTER XTRA FASTSET 3X (CAST SUPPLIES) ×15
SPONGE GAUZE 4X4 12PLY (GAUZE/BANDAGES/DRESSINGS) ×2 IMPLANT
STOCKINETTE 4X48 STRL (DRAPES) ×2 IMPLANT
SUT MERSILENE 3 0 FS 1 (SUTURE) ×2 IMPLANT
SUT VIC AB 0 CT1 27 (SUTURE)
SUT VIC AB 0 CT1 27XBRD ANBCTR (SUTURE) IMPLANT
SUT VIC AB 2-0 SH 27 (SUTURE)
SUT VIC AB 2-0 SH 27XBRD (SUTURE) IMPLANT
SUT VICRYL 4-0 PS2 18IN ABS (SUTURE) ×2 IMPLANT
SUT VICRYL RAPIDE 4/0 PS 2 (SUTURE) ×2 IMPLANT
SYR BULB 3OZ (MISCELLANEOUS) ×2 IMPLANT
SYR CONTROL 10ML LL (SYRINGE) IMPLANT
TOWEL OR 17X24 6PK STRL BLUE (TOWEL DISPOSABLE) ×2 IMPLANT
UNDERPAD 30X30 INCONTINENT (UNDERPADS AND DIAPERS) ×2 IMPLANT
WATER STERILE IRR 1000ML POUR (IV SOLUTION) ×2 IMPLANT

## 2012-01-14 NOTE — Anesthesia Procedure Notes (Addendum)
Anesthesia Regional Block:  Supraclavicular block  Pre-Anesthetic Checklist: ,, timeout performed, Correct Patient, Correct Site, Correct Laterality, Correct Procedure, Correct Position, site marked, Risks and benefits discussed,  Surgical consent,  Pre-op evaluation,  At surgeon's request and post-op pain management  Laterality: Right  Prep: chloraprep       Needles:   Needle Type: Other   (Arrow Echogenic)   Needle Length: 9cm  Needle Gauge: 21    Additional Needles:  Procedures: ultrasound guided Supraclavicular block Narrative:  Start time: 01/14/2012 8:13 AM End time: 01/14/2012 8:21 AM Injection made incrementally with aspirations every 5 mL.  Performed by: Personally  Anesthesiologist: C Frederick  Additional Notes: Ultrasound guidance used to: id relevant anatomy, confirm needle position, local anesthetic spread, avoidance of vascular puncture. Picture saved. No complications. Block performed personally by Janetta Hora. Gelene Mink, MD    Supraclavicular block Procedure Name: LMA Insertion Date/Time: 01/14/2012 8:56 AM Performed by: Naija Troost D Pre-anesthesia Checklist: Patient identified, Emergency Drugs available, Suction available and Patient being monitored Patient Re-evaluated:Patient Re-evaluated prior to inductionOxygen Delivery Method: Circle System Utilized Preoxygenation: Pre-oxygenation with 100% oxygen Intubation Type: IV induction Ventilation: Mask ventilation without difficulty LMA: LMA inserted LMA Size: 5.0 Number of attempts: 1 Placement Confirmation: positive ETCO2 Tube secured with: Tape Dental Injury: Teeth and Oropharynx as per pre-operative assessment

## 2012-01-14 NOTE — Transfer of Care (Signed)
Immediate Anesthesia Transfer of Care Note  Patient: [name]  Procedure(s) Performed: Procedure(s) (LRB): CARPECTOMY WITH RADIAL STYLOIDECTOMY (Right)  Patient Location: PACU  Anesthesia Type: General and Regional  Level of Consciousness: awake, alert , oriented and patient cooperative  Airway & Oxygen Therapy: Patient Spontanous Breathing and Patient connected to face mask oxygen  Post-op Assessment: Report given to PACU RN and Post -op Vital signs reviewed and stable  Post vital signs: Reviewed and stable  Complications: No apparent anesthesia complications

## 2012-01-14 NOTE — Op Note (Signed)
[name], [name]           ACCOUNT NO.:  [id number]  MEDICAL RECORD NO.:  [id number]  LOCATION:                                 FACILITY:  PHYSICIAN:  Cindee Salt, M.D.            DATE OF BIRTH:  DATE OF PROCEDURE:  01/14/2012 DATE OF DISCHARGE:                              OPERATIVE REPORT   PREOPERATIVE DIAGNOSIS:  Scapholunate advanced collapse wrist, right wrist.  POSTOPERATIVE DIAGNOSIS:  Scapholunate advanced collapse wrist, right wrist.  OPERATION:  Proximal carpectomy right wrist with capsular interposition, radial styloidectomy.  SURGEON:  Cindee Salt, M.D..  ANESTHESIA:  Supraclavicular block general.  ANESTHESIOLOGIST:  Janetta Hora. Gelene Mink, M.D.  HISTORY:  The patient is a 71 year old male with a history of a SLAC wrist.  This has become painful for him.  He is desirous of undergoing surgical correction.  He is aware of various alternatives including ulnar four-bone fusion, complete wrist fusion, proximal carpectomy with capsular interposition, has elected to undergo interposition with proximal carpectomy rather than ulnar four-bone fusion or complete wrist fusion.  Pre, peri, postoperative course had been discussed along with risks and complications.  He is aware that there is no guarantee with the surgery, possibility of infection, recurrence, injury to arteries, nerves, tendons, incomplete relief of symptoms, dystrophy, the possibility of further procedures being necessary with further degeneration. Preoperative area, the patient is seen, the extremity marked by both the patient and surgeon.  Antibiotic given.  PROCEDURE:  The patient was brought to the operating room where a supraclavicular block was carried out without difficulty.  General anesthetic also given.  He was prepped using ChloraPrep, supine position with the right arm free.  A 3 minute dry time was allowed.  Time-out taken, confirming the patient and procedure.  The limb was  exsanguinated with an Esmarch bandage.  Tourniquet was placed high and the arm was inflated to 250 mmHg.  A longitudinal incision was made over the dorsum of the right wrist centered over the 4th dorsal compartment carried down through subcutaneous tissue.  Bleeders were electrocauterized with bipolar.  Dorsal sensory nerves identified and protected.  The extensor retinaculum was released proximally to the level of Lister's tubercle. A very significant amount of swelling of the capsule was noted.  The capsule was then opened on its most radial aspect in direct line with the scapholunate ligament complex.  This allowed visualization of the capitate, degeneration of the cartilage was present on the proximal capitate.  It was decided to proceed with a capsular interposition, rather than a simple proximal carpectomy.  This allowed formation of a dorsal flap to be used as the interposition.  This was protected, the synovectomy was performed.  The radiocarpal articulation with the scaphoid was entirely degenerated with pointing of the styloid.  The scaphoid was then removed in a piecemeal fashion taking care to protect the volar ligaments especially radioscaphocapitate ligament.  The lunotriquetral joint was then identified on the ulnar side.  With blunt sharp dissection, the triquetrum was isolated.  This was then removed without difficulty after incising the scapholunate ligament complex. The lunate was then isolated with a large towel clip, this allowed mobilization of this  with careful subperiosteal dissection.  The lunate was removed protecting the lunate fossa of the distal radius.  The x- rays were taken confirming that the capitate immediately translated into the lunate fossa of the distal radius.  The wound was copiously irrigated with saline.  X-rays confirmed both AP and lateral position of the lunate facet being occupied by the capitate.  The capsule was then interposed between the  capitate and the lunate facet by gentle traction. A styloidectomy performed with osteotomes and rongeurs after isolating this with blunt dissection.  No impingement to the trapezium was noted. The capitate flap was then sutured to the volar capsule with figure-of- eight 3-0 Mersilene sutures.  X-rays again confirmed that the capitate remained in the lunate fossa both AP and lateral directions.  The capsule was then closed with figure-of-eight 4-0 Mersilene sutures and 3- 0 Vicryl sutures.  The retinaculum repaired with 3-0 Vicryl, the subcutaneous tissue with interrupted 4-0 Vicryl, and the skin with a subcuticular 4-0 Vicryl Rapide. A sterile compressive dressing, dorsal palmar radial thumb spica splint was applied.  X-rays confirmed that the capitate remained in the lunate facet.  On deflation of the tourniquet, all fingers immediately pinked, and he was taken to the recovery room for observation.          ______________________________ Cindee Salt, M.D.     GK/MEDQ  D:  01/14/2012  T:  01/14/2012  Job:  [id number]

## 2012-01-14 NOTE — Anesthesia Postprocedure Evaluation (Signed)
Anesthesia Post Note  Patient: [name]  Procedure(s) Performed: Procedure(s) (LRB): CARPECTOMY WITH RADIAL STYLOIDECTOMY (Right)  Anesthesia type: General  Patient location: PACU  Post pain: Pain level controlled  Post assessment: Patient's Cardiovascular Status Stable  Last Vitals:  Filed Vitals:   01/14/12 1100  BP: 122/69  Pulse: 81  Temp:   Resp: 18    Post vital signs: Reviewed and stable  Level of consciousness: alert  Complications: No apparent anesthesia complications
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
## 2012-01-14 NOTE — Progress Notes (Signed)
Dr. Gelene Mink in to evaluate patient. Discussed OSA with patient and recommended patient wear CPAP tonight. Patient and wife expressed understanding of the need to be cautious after discharge and monitor patient's respiratory status. Ok to d/c per Dr. Gelene Mink.

## 2012-01-14 NOTE — Anesthesia Preprocedure Evaluation (Signed)
Anesthesia Evaluation  Patient identified by MRN, date of birth, ID band Patient awake    Reviewed: Allergy & Precautions, H&P , NPO status , Patient's Chart, lab work & pertinent test results, reviewed documented beta blocker date and time   Airway Mallampati: II TM Distance: >3 FB Neck ROM: full    Dental   Pulmonary sleep apnea ,          Cardiovascular hypertension, On Medications + CAD negative cardio ROS      Neuro/Psych  Neuromuscular disease negative psych ROS   GI/Hepatic negative GI ROS, Neg liver ROS,   Endo/Other  negative endocrine ROS  Renal/GU negative Renal ROS  negative genitourinary   Musculoskeletal   Abdominal   Peds  Hematology negative hematology ROS (+)   Anesthesia Other Findings See surgeon's H&P   Reproductive/Obstetrics negative OB ROS                           Anesthesia Physical Anesthesia Plan  ASA: III  Anesthesia Plan: General   Post-op Pain Management:    Induction: Intravenous  Airway Management Planned: LMA  Additional Equipment:   Intra-op Plan:   Post-operative Plan: Extubation in OR  Informed Consent: I have reviewed the patients History and Physical, chart, labs and discussed the procedure including the risks, benefits and alternatives for the proposed anesthesia with the patient or authorized representative who has indicated his/her understanding and acceptance.   Dental Advisory Given  Plan Discussed with: CRNA and Surgeon  Anesthesia Plan Comments:         Anesthesia Quick Evaluation

## 2012-01-14 NOTE — Progress Notes (Signed)
Assisted Dr. Frederick with right, ultrasound guided, supraclavicular block. Side rails up, monitors on throughout procedure. See vital signs in flow sheet. Tolerated Procedure well. 

## 2012-01-14 NOTE — Op Note (Signed)
Dictated number: [id number]

## 2012-01-14 NOTE — Discharge Instructions (Addendum)
[facility] Instructions °Hand Surgery ° °Wound Care: °Keep your hand elevated above the level of your heart.  Do not allow it to dangle by your side.  Keep the dressing dry and do not remove it unless your doctor advises you to do so.  He will usually change it at the time of your post-op visit.  Moving your fingers is advised to stimulate circulation but will depend on the site of your surgery.  If you have a splint applied, your doctor will advise you regarding movement. ° °Activity: °Do not drive or operate machinery today.  Rest today and then you may return to your normal activity and work as indicated by your physician. ° °Diet:  °Drink liquids today or eat a light diet.  You may resume a regular diet tomorrow.   ° °General expectations: °Pain for two to three days. °Fingers may become slightly swollen. ° °Call your doctor if any of the following occur: °Severe pain not relieved by pain medication. °Elevated temperature. °Dressing soaked with blood. °Inability to move fingers. °White or bluish color to fingers. ° ° °Post Anesthesia Home Care Instructions ° °Activity: °Get plenty of rest for the remainder of the day. A responsible adult should stay with you for 24 hours following the procedure.  °For the next 24 hours, DO NOT: °-Drive a car °-Operate machinery °-Drink alcoholic beverages °-Take any medication unless instructed by your physician °-Make any legal decisions or sign important papers. ° °Meals: °Start with liquid foods such as gelatin or soup. Progress to regular foods as tolerated. Avoid greasy, spicy, heavy foods. If nausea and/or vomiting occur, drink only clear liquids until the nausea and/or vomiting subsides. Call your physician if vomiting continues. ° °Special Instructions/Symptoms: °Your throat may feel dry or sore from the anesthesia or the breathing tube placed in your throat during surgery. If this causes discomfort, gargle with warm salt water. The discomfort should disappear within 24  hours. ° ° °Regional Anesthesia Blocks ° °1. Numbness or the inability to move the "blocked" extremity may last from 3-48 hours after placement. The length of time depends on the medication injected and your individual response to the medication. If the numbness is not going away after 48 hours, call your surgeon. ° °2. The extremity that is blocked will need to be protected until the numbness is gone and the  Strength has returned. Because you cannot feel it, you will need to take extra care to avoid injury. Because it may be weak, you may have difficulty moving it or using it. You may not know what position it is in without looking at it while the block is in effect. ° °3. For blocks in the legs and feet, returning to weight bearing and walking needs to be done carefully. You will need to wait until the numbness is entirely gone and the strength has returned. You should be able to move your leg and foot normally before you try and bear weight or walk. You will need someone to be with you when you first try to ensure you do not fall and possibly risk injury. ° °4. Bruising and tenderness at the needle site are common side effects and will resolve in a few days. ° °5. Persistent numbness or new problems with movement should be communicated to the surgeon or the [facility] ([phone])/ [facility] ([phone]). °

## 2012-01-14 NOTE — Brief Op Note (Signed)
01/14/2012  10:37 AM  PATIENT:  Gwenith Daily Buffin  71 y.o. male  PRE-OPERATIVE DIAGNOSIS:  slac wrist on right  POST-OPERATIVE DIAGNOSIS:  Right Slac Wrist  PROCEDURE:  Procedure(s) (LRB): CARPECTOMY WITH RADIAL STYLOIDECTOMY (Right)  SURGEON:  Surgeon(s) and Role:    * Nicki Reaper, MD - Primary  PHYSICIAN ASSISTANT:   ASSISTANTS: none   ANESTHESIA:   regional and general  EBL:  Total I/O In: 1500 [I.V.:1500] Out: -   BLOOD ADMINISTERED:none  DRAINS: none   LOCAL MEDICATIONS USED:  NONE  SPECIMEN:  No Specimen  DISPOSITION OF SPECIMEN:  N/A  COUNTS:  YES  TOURNIQUET:   Total Tourniquet Time Documented: Upper Arm (Right) - 74 minutes  DICTATION: .Other Dictation: Dictation Number [id number]  PLAN OF CARE: Discharge to home after PACU  PATIENT DISPOSITION:  PACU - hemodynamically stable.
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
## 2012-01-20 ENCOUNTER — Encounter (HOSPITAL_BASED_OUTPATIENT_CLINIC_OR_DEPARTMENT_OTHER): Payer: Self-pay

## 2012-02-06 ENCOUNTER — Other Ambulatory Visit: Payer: Self-pay | Admitting: *Deleted

## 2012-02-06 MED ORDER — TAMSULOSIN HCL 0.4 MG PO CAPS: 0.4000 mg | ORAL_CAPSULE | Freq: Every day | ORAL | Status: DC

## 2012-03-01 ENCOUNTER — Other Ambulatory Visit (INDEPENDENT_AMBULATORY_CARE_PROVIDER_SITE_OTHER): Payer: Medicare Other

## 2012-03-01 ENCOUNTER — Encounter: Payer: Self-pay | Admitting: Internal Medicine

## 2012-03-01 ENCOUNTER — Ambulatory Visit (INDEPENDENT_AMBULATORY_CARE_PROVIDER_SITE_OTHER): Payer: Medicare Other | Admitting: Internal Medicine

## 2012-03-01 VITALS — BP 138/80 | HR 80 | Temp 96.8°F | Resp 16 | Ht 72.0 in | Wt 253.0 lb

## 2012-03-01 DIAGNOSIS — Z136 Encounter for screening for cardiovascular disorders: Secondary | ICD-10-CM

## 2012-03-01 DIAGNOSIS — N320 Bladder-neck obstruction: Secondary | ICD-10-CM

## 2012-03-01 DIAGNOSIS — L570 Actinic keratosis: Secondary | ICD-10-CM

## 2012-03-01 DIAGNOSIS — I2510 Atherosclerotic heart disease of native coronary artery without angina pectoris: Secondary | ICD-10-CM

## 2012-03-01 DIAGNOSIS — Z0000 Encounter for general adult medical examination without abnormal findings: Secondary | ICD-10-CM | POA: Insufficient documentation

## 2012-03-01 DIAGNOSIS — Z79899 Other long term (current) drug therapy: Secondary | ICD-10-CM

## 2012-03-01 LAB — URINALYSIS, ROUTINE W REFLEX MICROSCOPIC
Bilirubin Urine: NEGATIVE
Hgb urine dipstick: NEGATIVE
Ketones, ur: NEGATIVE
Leukocytes, UA: NEGATIVE
Nitrite: NEGATIVE
Specific Gravity, Urine: 1.025 (ref 1.000–1.030)
Total Protein, Urine: NEGATIVE
Urine Glucose: NEGATIVE
Urobilinogen, UA: 0.2 (ref 0.0–1.0)
pH: 6 (ref 5.0–8.0)

## 2012-03-01 LAB — BASIC METABOLIC PANEL
BUN: 18 mg/dL (ref 6–23)
CO2: 30 mEq/L (ref 19–32)
Calcium: 9.4 mg/dL (ref 8.4–10.5)
Chloride: 103 mEq/L (ref 96–112)
Creatinine, Ser: 0.7 mg/dL (ref 0.4–1.5)
GFR: 118.17 mL/min (ref >60.00)
Glucose, Bld: 94 mg/dL (ref 70–99)
Potassium: 4.4 mEq/L (ref 3.5–5.1)
Sodium: 140 mEq/L (ref 135–145)

## 2012-03-01 LAB — CBC WITH DIFFERENTIAL/PLATELET
Basophils Absolute: 0 10*3/uL (ref 0.0–0.1)
Basophils Relative: 0.9 % (ref 0.0–3.0)
Eosinophils Absolute: 0.2 10*3/uL (ref 0.0–0.7)
Eosinophils Relative: 3.7 % (ref 0.0–5.0)
HCT: 44.1 % (ref 39.0–52.0)
Hemoglobin: 14.7 g/dL (ref 13.0–17.0)
Lymphocytes Relative: 38.5 % (ref 12.0–46.0)
Lymphs Abs: 2 10*3/uL (ref 0.7–4.0)
MCHC: 33.3 g/dL (ref 30.0–36.0)
MCV: 94.6 fl (ref 78.0–100.0)
Monocytes Absolute: 0.8 10*3/uL (ref 0.1–1.0)
Monocytes Relative: 15.8 % — ABNORMAL HIGH (ref 3.0–12.0)
Neutro Abs: 2.1 10*3/uL (ref 1.4–7.7)
Neutrophils Relative %: 41.1 % — ABNORMAL LOW (ref 43.0–77.0)
Platelets: 180 10*3/uL (ref 150.0–400.0)
RBC: 4.66 Mil/uL (ref 4.22–5.81)
RDW: 13.4 % (ref 11.5–14.6)
WBC: 5.1 10*3/uL (ref 4.5–10.5)

## 2012-03-01 LAB — LIPID PANEL
Cholesterol: 180 mg/dL (ref 0–200)
HDL: 51 mg/dL (ref >39.00)
LDL Cholesterol: 90 mg/dL (ref 0–99)
Total CHOL/HDL Ratio: 4
Triglycerides: 194 mg/dL — ABNORMAL HIGH (ref 0.0–149.0)
VLDL: 38.8 mg/dL (ref 0.0–40.0)

## 2012-03-01 LAB — HEPATIC FUNCTION PANEL
ALT: 24 U/L (ref 0–53)
AST: 24 U/L (ref 0–37)
Albumin: 4.3 g/dL (ref 3.5–5.2)
Alkaline Phosphatase: 55 U/L (ref 39–117)
Bilirubin, Direct: 0.1 mg/dL (ref 0.0–0.3)
Total Bilirubin: 1 mg/dL (ref 0.3–1.2)
Total Protein: 7.1 g/dL (ref 6.0–8.3)

## 2012-03-01 LAB — PSA: PSA: 0.49 ng/mL (ref 0.10–4.00)

## 2012-03-01 LAB — TSH: TSH: 0.78 u[IU]/mL (ref 0.35–5.50)

## 2012-03-01 MED ORDER — FINASTERIDE 5 MG PO TABS: 5.0000 mg | ORAL_TABLET | Freq: Every day | ORAL | Status: DC

## 2012-03-01 MED ORDER — DULOXETINE HCL 60 MG PO CPEP: 60.0000 mg | ORAL_CAPSULE | Freq: Every day | ORAL | Status: DC

## 2012-03-01 NOTE — Assessment & Plan Note (Signed)

## 2012-03-01 NOTE — Assessment & Plan Note (Signed)
R temple - cryo 

## 2012-03-01 NOTE — Progress Notes (Signed)
Subjective:    Patient ID: [name], male    DOB: Dec 20, 1940, 71 y.o.   MRN: [id number]  HPI  The patient is here for a wellness exam. The patient has been doing well overall without major physical or psychological issues going on lately. The patient presents for a follow-up of  chronic hypertension, chronic dyslipidemia, CAD controlled with medicines C/o R wrist pain - he had surgery (injury) - R thumb - thumb suspension surgery s/p   Review of Systems  Constitutional: Negative for appetite change, fatigue and unexpected weight change.  HENT: Negative for nosebleeds, congestion, sore throat, sneezing, trouble swallowing and neck pain.   Eyes: Negative for itching and visual disturbance.  Respiratory: Negative for cough.   Cardiovascular: Negative for chest pain, palpitations and leg swelling.  Gastrointestinal: Negative for nausea, diarrhea, blood in stool and abdominal distention.  Genitourinary: Negative for frequency and hematuria.  Musculoskeletal: Positive for back pain and arthralgias. Negative for joint swelling and gait problem.  Skin: Negative for rash.  Neurological: Negative for dizziness, tremors, speech difficulty and weakness.  Psychiatric/Behavioral: Negative for disturbed wake/sleep cycle, dysphoric mood and agitation. The patient is not nervous/anxious.        Objective:   Physical Exam  Constitutional: He is oriented to person, place, and time. He appears well-developed.  HENT:  Mouth/Throat: Oropharynx is clear and moist.  Eyes: Conjunctivae are normal. Pupils are equal, round, and reactive to light.  Neck: Normal range of motion. No JVD present. No thyromegaly present.  Cardiovascular: Normal rate, regular rhythm, normal heart sounds and intact distal pulses.  Exam reveals no gallop and no friction rub.   No murmur heard. Pulmonary/Chest: Effort normal and breath sounds normal. No respiratory distress. He has no wheezes. He has no rales. He  exhibits no tenderness.  Abdominal: Soft. Bowel sounds are normal. He exhibits no distension and no mass. There is no tenderness. There is no rebound and no guarding.  Musculoskeletal: Normal range of motion. He exhibits tenderness (R radial wrist is tender). He exhibits no edema.  Lymphadenopathy:    He has no cervical adenopathy.  Neurological: He is alert and oriented to person, place, and time. He has normal reflexes. No cranial nerve deficit. He exhibits normal muscle tone. Coordination normal.  Skin: Skin is warm and dry. No rash noted.  Psychiatric: He has a normal mood and affect. His behavior is normal. Judgment and thought content normal.  AK R temple  R wrist is tender w/a scar, swollen  Lab Results  Component Value Date   WBC 4.4* 07/11/2010   HGB 15.9 01/14/2012   HCT 40.8 07/11/2010   PLT 180.0 07/11/2010   GLUCOSE 99 07/11/2010   CHOL 137 07/11/2010   TRIG 76.0 07/11/2010   HDL 43.60 07/11/2010   LDLDIRECT 157.5 07/05/2008   LDLCALC 78 07/11/2010   ALT 24 07/11/2010   AST 27 07/11/2010   NA 141 07/11/2010   K 4.9 07/11/2010   CL 101 07/11/2010   CREATININE 0.8 07/11/2010   BUN 17 07/11/2010   CO2 29 07/11/2010   TSH 0.58 07/11/2010   PSA 0.46 07/11/2010    Procedure Note :     Procedure : Cryosurgery   Indication:    Actinic keratosis(es)   Risks including unsuccessful procedure , bleeding, infection, bruising, scar, a need for a repeat  procedure and others were explained to the patient in detail as well as the benefits. Informed consent was obtained verbally.   1  lesion(s)  on R temple   was/were treated with liquid nitrogen on a Q-tip in a usual fasion . Band-Aid was applied and antibiotic ointment was given for a later use.   Tolerated well. Complications none.   Postprocedure instructions :     Keep the wounds clean. You can wash them with liquid soap and water. Pat dry with gauze or a Kleenex tissue  Before applying antibiotic ointment and a  Band-Aid.   You need to report immediately  if  any signs of infection develop.        Assessment & Plan:

## 2012-03-02 ENCOUNTER — Telehealth: Payer: Self-pay | Admitting: Internal Medicine

## 2012-03-02 NOTE — Telephone Encounter (Signed)
[name], please, inform patient that all labs are normal except for elev TG - improve diet Thx

## 2012-03-03 NOTE — Telephone Encounter (Signed)
Left detailed mess informing pt of below.  

## 2012-04-05 ENCOUNTER — Other Ambulatory Visit: Payer: Self-pay | Admitting: Internal Medicine

## 2012-05-11 ENCOUNTER — Telehealth: Payer: Self-pay | Admitting: Internal Medicine

## 2012-05-11 NOTE — Telephone Encounter (Signed)
Forward 3 pages from [facility] to Dr. Macarthur Critchley [name] for review on 05-11-12 ym

## 2012-05-19 DIAGNOSIS — Z0279 Encounter for issue of other medical certificate: Secondary | ICD-10-CM

## 2012-08-15 ENCOUNTER — Other Ambulatory Visit: Payer: Self-pay | Admitting: Internal Medicine

## 2012-09-07 ENCOUNTER — Ambulatory Visit: Payer: Medicare Other | Admitting: Internal Medicine

## 2012-09-09 ENCOUNTER — Encounter: Payer: Self-pay | Admitting: Gastroenterology

## 2012-09-14 ENCOUNTER — Ambulatory Visit: Payer: Medicare Other | Admitting: Internal Medicine

## 2012-09-29 ENCOUNTER — Ambulatory Visit: Payer: Medicare Other | Admitting: Internal Medicine

## 2012-10-12 ENCOUNTER — Other Ambulatory Visit (INDEPENDENT_AMBULATORY_CARE_PROVIDER_SITE_OTHER): Payer: Medicare Other

## 2012-10-12 ENCOUNTER — Ambulatory Visit (INDEPENDENT_AMBULATORY_CARE_PROVIDER_SITE_OTHER): Payer: Medicare Other | Admitting: Internal Medicine

## 2012-10-12 ENCOUNTER — Encounter: Payer: Self-pay | Admitting: Internal Medicine

## 2012-10-12 VITALS — BP 130/70 | HR 76 | Temp 97.3°F | Resp 16 | Wt 253.0 lb

## 2012-10-12 DIAGNOSIS — M25539 Pain in unspecified wrist: Secondary | ICD-10-CM

## 2012-10-12 DIAGNOSIS — M545 Low back pain, unspecified: Secondary | ICD-10-CM

## 2012-10-12 DIAGNOSIS — J310 Chronic rhinitis: Secondary | ICD-10-CM

## 2012-10-12 DIAGNOSIS — I2510 Atherosclerotic heart disease of native coronary artery without angina pectoris: Secondary | ICD-10-CM

## 2012-10-12 DIAGNOSIS — I10 Essential (primary) hypertension: Secondary | ICD-10-CM

## 2012-10-12 LAB — BASIC METABOLIC PANEL
BUN: 18 mg/dL (ref 6–23)
CO2: 30 mEq/L (ref 19–32)
Calcium: 9.6 mg/dL (ref 8.4–10.5)
Chloride: 106 mEq/L (ref 96–112)
Creatinine, Ser: 0.8 mg/dL (ref 0.4–1.5)
GFR: 107.28 mL/min (ref >60.00)
Glucose, Bld: 99 mg/dL (ref 70–99)
Potassium: 4.4 mEq/L (ref 3.5–5.1)
Sodium: 141 mEq/L (ref 135–145)

## 2012-10-12 MED ORDER — DULOXETINE HCL 60 MG PO CPEP: 60.0000 mg | ORAL_CAPSULE | Freq: Every day | ORAL | Status: DC

## 2012-10-12 MED ORDER — FLUTICASONE PROPIONATE 50 MCG/ACT NA SUSP: 2.0000 | Freq: Every day | NASAL | Status: DC

## 2012-10-12 NOTE — Assessment & Plan Note (Signed)
Continue with current prescription therapy as reflected on the Med list.  

## 2012-10-12 NOTE — Progress Notes (Signed)
   Subjective:    HPI  The patient is here for a wellness exam. The patient has been doing well overall without major physical or psychological issues going on lately. The patient presents for a follow-up of  chronic hypertension, chronic dyslipidemia, CAD controlled with medicines C/o R wrist pain - he had surgery (injury) - R thumb - thumb suspension surgery s/p C/o post-nasal drip and cough   Review of Systems  Constitutional: Negative for appetite change, fatigue and unexpected weight change.  HENT: Negative for nosebleeds, congestion, sore throat, sneezing, trouble swallowing and neck pain.   Eyes: Negative for itching and visual disturbance.  Respiratory: Negative for cough.   Cardiovascular: Negative for chest pain, palpitations and leg swelling.  Gastrointestinal: Negative for nausea, diarrhea, blood in stool and abdominal distention.  Genitourinary: Negative for frequency and hematuria.  Musculoskeletal: Positive for back pain and arthralgias. Negative for joint swelling and gait problem.  Skin: Negative for rash.  Neurological: Negative for dizziness, tremors, speech difficulty and weakness.  Psychiatric/Behavioral: Negative for sleep disturbance, dysphoric mood and agitation. The patient is not nervous/anxious.        Objective:   Physical Exam  Constitutional: He is oriented to person, place, and time. He appears well-developed.  HENT:  Mouth/Throat: Oropharynx is clear and moist.  Eyes: Conjunctivae are normal. Pupils are equal, round, and reactive to light.  Neck: Normal range of motion. No JVD present. No thyromegaly present.  Cardiovascular: Normal rate, regular rhythm, normal heart sounds and intact distal pulses.  Exam reveals no gallop and no friction rub.   No murmur heard. Pulmonary/Chest: Effort normal and breath sounds normal. No respiratory distress. He has no wheezes. He has no rales. He exhibits no tenderness.  Abdominal: Soft. Bowel sounds are normal.  He exhibits no distension and no mass. There is no tenderness. There is no rebound and no guarding.  Musculoskeletal: Normal range of motion. He exhibits tenderness (R radial wrist is tender). He exhibits no edema.  Lymphadenopathy:    He has no cervical adenopathy.  Neurological: He is alert and oriented to person, place, and time. He has normal reflexes. No cranial nerve deficit. He exhibits normal muscle tone. Coordination normal.  Skin: Skin is warm and dry. No rash noted.  Psychiatric: He has a normal mood and affect. His behavior is normal. Judgment and thought content normal.    R wrist is tender w/a scar, swollen  Lab Results  Component Value Date   WBC 5.1 03/01/2012   HGB 14.7 03/01/2012   HCT 44.1 03/01/2012   PLT 180.0 03/01/2012   GLUCOSE 94 03/01/2012   CHOL 180 03/01/2012   TRIG 194.0* 03/01/2012   HDL 51.00 03/01/2012   LDLDIRECT 157.5 07/05/2008   LDLCALC 90 03/01/2012   ALT 24 03/01/2012   AST 24 03/01/2012   NA 140 03/01/2012   K 4.4 03/01/2012   CL 103 03/01/2012   CREATININE 0.7 03/01/2012   BUN 18 03/01/2012   CO2 30 03/01/2012   TSH 0.78 03/01/2012   PSA 0.49 03/01/2012        Assessment & Plan:

## 2012-10-12 NOTE — Assessment & Plan Note (Signed)
Flonase

## 2013-02-21 ENCOUNTER — Other Ambulatory Visit: Payer: Self-pay | Admitting: Internal Medicine

## 2013-04-04 ENCOUNTER — Other Ambulatory Visit: Payer: Self-pay | Admitting: Internal Medicine

## 2013-04-12 ENCOUNTER — Ambulatory Visit: Payer: Medicare Other | Admitting: Internal Medicine

## 2013-04-12 DIAGNOSIS — Z0289 Encounter for other administrative examinations: Secondary | ICD-10-CM

## 2013-04-18 ENCOUNTER — Other Ambulatory Visit: Payer: Self-pay | Admitting: Internal Medicine

## 2013-04-19 ENCOUNTER — Other Ambulatory Visit: Payer: Self-pay | Admitting: Internal Medicine

## 2013-04-19 ENCOUNTER — Ambulatory Visit: Payer: Medicare Other | Admitting: Internal Medicine

## 2013-05-23 ENCOUNTER — Ambulatory Visit (INDEPENDENT_AMBULATORY_CARE_PROVIDER_SITE_OTHER): Payer: Medicare Other | Admitting: Internal Medicine

## 2013-05-23 ENCOUNTER — Encounter: Payer: Self-pay | Admitting: Internal Medicine

## 2013-05-23 VITALS — BP 132/82 | HR 66 | Temp 96.9°F | Wt 248.0 lb

## 2013-05-23 DIAGNOSIS — Z23 Encounter for immunization: Secondary | ICD-10-CM

## 2013-05-23 DIAGNOSIS — M79609 Pain in unspecified limb: Secondary | ICD-10-CM

## 2013-05-23 DIAGNOSIS — I10 Essential (primary) hypertension: Secondary | ICD-10-CM

## 2013-05-23 DIAGNOSIS — I2510 Atherosclerotic heart disease of native coronary artery without angina pectoris: Secondary | ICD-10-CM

## 2013-05-23 NOTE — Progress Notes (Signed)
   Subjective:    HPI   The patient presents for a follow-up of  chronic hypertension, chronic dyslipidemia, CAD controlled with medicines. Wife is very sick w/CHF  Wt Readings from Last 3 Encounters:  05/23/13 248 lb (112.492 kg)  10/12/12 253 lb (114.76 kg)  03/01/12 253 lb (114.76 kg)   BP Readings from Last 3 Encounters:  05/23/13 132/82  10/12/12 130/70  03/01/12 138/80      Review of Systems  Constitutional: Negative for appetite change, fatigue and unexpected weight change.  HENT: Negative for nosebleeds, congestion, sore throat, sneezing, trouble swallowing and neck pain.   Eyes: Negative for itching and visual disturbance.  Respiratory: Negative for cough.   Cardiovascular: Negative for chest pain, palpitations and leg swelling.  Gastrointestinal: Negative for nausea, diarrhea, blood in stool and abdominal distention.  Genitourinary: Negative for frequency and hematuria.  Musculoskeletal: Positive for back pain and arthralgias. Negative for joint swelling and gait problem.  Skin: Negative for rash.  Neurological: Negative for dizziness, tremors, speech difficulty and weakness.  Psychiatric/Behavioral: Negative for sleep disturbance, dysphoric mood and agitation. The patient is not nervous/anxious.        Objective:   Physical Exam  Constitutional: He is oriented to person, place, and time. He appears well-developed.  HENT:  Mouth/Throat: Oropharynx is clear and moist.  Eyes: Conjunctivae are normal. Pupils are equal, round, and reactive to light.  Neck: Normal range of motion. No JVD present. No thyromegaly present.  Cardiovascular: Normal rate, regular rhythm, normal heart sounds and intact distal pulses.  Exam reveals no gallop and no friction rub.   No murmur heard. Pulmonary/Chest: Effort normal and breath sounds normal. No respiratory distress. He has no wheezes. He has no rales. He exhibits no tenderness.  Abdominal: Soft. Bowel sounds are normal. He  exhibits no distension and no mass. There is no tenderness. There is no rebound and no guarding.  Musculoskeletal: Normal range of motion. He exhibits tenderness (R radial wrist is tender). He exhibits no edema.  Lymphadenopathy:    He has no cervical adenopathy.  Neurological: He is alert and oriented to person, place, and time. He has normal reflexes. No cranial nerve deficit. He exhibits normal muscle tone. Coordination normal.  Skin: Skin is warm and dry. No rash noted.  Psychiatric: He has a normal mood and affect. His behavior is normal. Judgment and thought content normal.  L ischial bone is tender to palpation  R wrist is tender w/a scar, swollen  Lab Results  Component Value Date   WBC 5.1 03/01/2012   HGB 14.7 03/01/2012   HCT 44.1 03/01/2012   PLT 180.0 03/01/2012   GLUCOSE 99 10/12/2012   CHOL 180 03/01/2012   TRIG 194.0* 03/01/2012   HDL 51.00 03/01/2012   LDLDIRECT 157.5 07/05/2008   LDLCALC 90 03/01/2012   ALT 24 03/01/2012   AST 24 03/01/2012   NA 141 10/12/2012   K 4.4 10/12/2012   CL 106 10/12/2012   CREATININE 0.8 10/12/2012   BUN 18 10/12/2012   CO2 30 10/12/2012   TSH 0.78 03/01/2012   PSA 0.49 03/01/2012        Assessment & Plan:

## 2013-05-23 NOTE — Assessment & Plan Note (Signed)
Continue with current prescription therapy as reflected on the Med list.  
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
## 2013-05-23 NOTE — Assessment & Plan Note (Signed)
Continue with current prescription therapy as reflected on the Med list. Labs  

## 2013-06-04 ENCOUNTER — Emergency Department (HOSPITAL_COMMUNITY): Payer: Medicare Other

## 2013-06-04 ENCOUNTER — Encounter (HOSPITAL_COMMUNITY): Payer: Self-pay | Admitting: Emergency Medicine

## 2013-06-04 ENCOUNTER — Emergency Department (HOSPITAL_COMMUNITY)
Admission: EM | Admit: 2013-06-04 | Discharge: 2013-06-04 | Disposition: A | Discharge status: Home / Self Care | Payer: Medicare Other | Attending: Emergency Medicine | Admitting: Emergency Medicine

## 2013-06-04 DIAGNOSIS — Y92410 Unspecified street and highway as the place of occurrence of the external cause: Secondary | ICD-10-CM | POA: Insufficient documentation

## 2013-06-04 DIAGNOSIS — E785 Hyperlipidemia, unspecified: Secondary | ICD-10-CM | POA: Insufficient documentation

## 2013-06-04 DIAGNOSIS — M1990 Unspecified osteoarthritis, unspecified site: Secondary | ICD-10-CM | POA: Insufficient documentation

## 2013-06-04 DIAGNOSIS — Y9389 Activity, other specified: Secondary | ICD-10-CM | POA: Insufficient documentation

## 2013-06-04 DIAGNOSIS — I2510 Atherosclerotic heart disease of native coronary artery without angina pectoris: Secondary | ICD-10-CM | POA: Insufficient documentation

## 2013-06-04 DIAGNOSIS — Z87448 Personal history of other diseases of urinary system: Secondary | ICD-10-CM | POA: Insufficient documentation

## 2013-06-04 DIAGNOSIS — Z23 Encounter for immunization: Secondary | ICD-10-CM | POA: Insufficient documentation

## 2013-06-04 DIAGNOSIS — Z87891 Personal history of nicotine dependence: Secondary | ICD-10-CM | POA: Insufficient documentation

## 2013-06-04 DIAGNOSIS — IMO0002 Reserved for concepts with insufficient information to code with codable children: Secondary | ICD-10-CM | POA: Insufficient documentation

## 2013-06-04 DIAGNOSIS — S2232XA Fracture of one rib, left side, initial encounter for closed fracture: Secondary | ICD-10-CM

## 2013-06-04 DIAGNOSIS — S2249XA Multiple fractures of ribs, unspecified side, initial encounter for closed fracture: Secondary | ICD-10-CM | POA: Insufficient documentation

## 2013-06-04 DIAGNOSIS — I10 Essential (primary) hypertension: Secondary | ICD-10-CM | POA: Insufficient documentation

## 2013-06-04 DIAGNOSIS — Z79899 Other long term (current) drug therapy: Secondary | ICD-10-CM | POA: Insufficient documentation

## 2013-06-04 DIAGNOSIS — Z8669 Personal history of other diseases of the nervous system and sense organs: Secondary | ICD-10-CM | POA: Insufficient documentation

## 2013-06-04 DIAGNOSIS — R0602 Shortness of breath: Secondary | ICD-10-CM | POA: Insufficient documentation

## 2013-06-04 MED ORDER — BACITRACIN 500 UNIT/GM EX OINT
1.0000 "application " | TOPICAL_OINTMENT | Freq: Two times a day (BID) | CUTANEOUS | Status: DC
  Filled 2013-06-04: qty 0.9

## 2013-06-04 MED ORDER — HYDROCODONE-ACETAMINOPHEN 5-325 MG PO TABS: 2.0000 | ORAL_TABLET | ORAL | Status: DC | PRN

## 2013-06-04 MED ORDER — KETOROLAC TROMETHAMINE 60 MG/2ML IM SOLN
60.0000 mg | Freq: Once | INTRAMUSCULAR | Status: AC
  Administered 2013-06-04: 60 mg via INTRAMUSCULAR
  Filled 2013-06-04: qty 2

## 2013-06-04 MED ORDER — TETANUS-DIPHTH-ACELL PERTUSSIS 5-2.5-18.5 LF-MCG/0.5 IM SUSP
0.5000 mL | Freq: Once | INTRAMUSCULAR | Status: AC
  Administered 2013-06-04: 0.5 mL via INTRAMUSCULAR
  Filled 2013-06-04: qty 0.5

## 2013-06-04 MED ORDER — NAPROXEN 500 MG PO TABS: 500.0000 mg | ORAL_TABLET | Freq: Two times a day (BID) | ORAL | Status: DC
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
## 2013-07-09 ENCOUNTER — Ambulatory Visit (INDEPENDENT_AMBULATORY_CARE_PROVIDER_SITE_OTHER): Payer: Medicare Other | Admitting: Family Medicine

## 2013-07-09 VITALS — BP 102/68 | HR 84 | Temp 97.8°F | Resp 18 | Ht 70.0 in | Wt 253.0 lb

## 2013-07-09 DIAGNOSIS — L0291 Cutaneous abscess, unspecified: Secondary | ICD-10-CM

## 2013-07-09 DIAGNOSIS — L0390 Cellulitis, unspecified: Secondary | ICD-10-CM

## 2013-07-09 DIAGNOSIS — M79609 Pain in unspecified limb: Secondary | ICD-10-CM

## 2013-07-09 MED ORDER — AMOXICILLIN-POT CLAVULANATE 875-125 MG PO TABS: 1.0000 | ORAL_TABLET | Freq: Two times a day (BID) | ORAL | Status: DC

## 2013-07-09 NOTE — Progress Notes (Signed)
  Subjective:  This chart was scribed for Elvina Sidle, MD by Carl Best, Medical Scribe. This patient was seen in Room 8 and the patient's care was started at 1:29 PM.   Patient ID: [name], male    DOB: [birth year], 72 y.o.   MRN: [id number]  HPI HPI Comments: [name] is a 72 y.o. male who presents to the [facility] complaining of a worsening skin infection on his right arm that started last night.  The patient states that the infection started off as what looked like an infected hair follicle and progressively spread to his right forearm.  He lists nausea and right arm arthralgia as associated symptoms.  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     Past Medical History  Diagnosis Date  . Hyperlipidemia   . Osteoarthritis   . LBP (low back pain)   . Insomnia   . CAD (coronary artery disease) 2000    Dr Wynona Dove test negative  . OSA (obstructive sleep apnea)     lost 40 lb-does not snore-wife says he is not apnic now  . BPH (benign prostatic hyperplasia)   . CTS (carpal tunnel syndrome)     better  . HTN (hypertension)     no meds   Past Surgical History  Procedure Laterality Date  . Total knee arthroplasty  2003    Right  . Lumbar fusion  2007    Cage, Dr Venetia Maxon  . Carpal tunnel release      Dr Cathlean Cower  . Tonsillectomy    . Eye surgery      both cataracts  . Colonoscopy     History   Social History  . Marital Status: Widowed    Spouse Name: N/A    Number of Children: N/A  . Years of Education: N/A   Occupational History  . Retired    Social History Main Topics  . Smoking status: Former Smoker    Quit date: 01/08/1987  . Smokeless tobacco: Not on file  . Alcohol Use: No  . Drug Use: No  . Sexual Activity: Not Currently   Other Topics Concern  . Not on file   Social History Narrative   Regular Exercise -  YES   No Known Allergies Filed Vitals:   07/09/13 1322  BP:  102/68  Pulse: 84  Temp: 97.8 F (36.6 C)  Resp: 18  Height: 5\' 10"  (1.778 m)  Weight: 253 lb (114.76 kg)  SpO2: 96%     Review of Systems   Objective:  Physical Exam No acute distress Right forearm is mildly swollen in the midsection over the radius. There is overlying yellow irregular eschar. Right arm range of motion is normal   Assessment & Plan:    Cellulitis Augmentin 875 twice a day x10 days to be taken with food. S. patient call back is not significantly better in 48 hours Signed, Elvina Sidle, MD

## 2013-07-13 ENCOUNTER — Ambulatory Visit (INDEPENDENT_AMBULATORY_CARE_PROVIDER_SITE_OTHER): Payer: Medicare Other | Admitting: Internal Medicine

## 2013-07-13 ENCOUNTER — Encounter: Payer: Self-pay | Admitting: Internal Medicine

## 2013-07-13 VITALS — BP 118/80 | HR 76 | Temp 97.9°F | Resp 16 | Wt 237.0 lb

## 2013-07-13 DIAGNOSIS — IMO0002 Reserved for concepts with insufficient information to code with codable children: Secondary | ICD-10-CM

## 2013-07-13 DIAGNOSIS — F4321 Adjustment disorder with depressed mood: Secondary | ICD-10-CM

## 2013-07-13 DIAGNOSIS — J310 Chronic rhinitis: Secondary | ICD-10-CM

## 2013-07-13 DIAGNOSIS — L03113 Cellulitis of right upper limb: Secondary | ICD-10-CM

## 2013-07-13 MED ORDER — DOXYCYCLINE HYCLATE 100 MG PO TABS: 100.0000 mg | ORAL_TABLET | Freq: Two times a day (BID) | ORAL | Status: DC

## 2013-07-13 MED ORDER — MUPIROCIN 2 % EX OINT: TOPICAL_OINTMENT | CUTANEOUS | Status: DC

## 2013-07-13 MED ORDER — FLUTICASONE PROPIONATE 50 MCG/ACT NA SUSP: 2.0000 | Freq: Every day | NASAL | Status: DC

## 2013-07-13 NOTE — Assessment & Plan Note (Signed)
Flonase

## 2013-07-13 NOTE — Progress Notes (Signed)
Pre visit review using our clinic review tool, if applicable. No additional management support is needed unless otherwise documented below in the visit note. 

## 2013-07-13 NOTE — Progress Notes (Signed)
   Subjective:    HPI  F/u Satph infection on R forearm - worse per pt. He was seen on Sat and started on Augmentin. F/u - grieving - wife died in oct 21/2014 The patient presents for a follow-up of  chronic hypertension, chronic dyslipidemia, CAD controlled with medicines. Wife is very sick w/CHF  Wt Readings from Last 3 Encounters:  07/13/13 237 lb (107.502 kg)  07/09/13 253 lb (114.76 kg)  05/23/13 248 lb (112.492 kg)   BP Readings from Last 3 Encounters:  07/13/13 118/80  07/09/13 102/68  06/04/13 130/75      Review of Systems  Constitutional: Negative for appetite change, fatigue and unexpected weight change.  HENT: Negative for congestion, nosebleeds, sneezing, sore throat and trouble swallowing.   Eyes: Negative for itching and visual disturbance.  Respiratory: Negative for cough.   Cardiovascular: Negative for chest pain, palpitations and leg swelling.  Gastrointestinal: Negative for nausea, diarrhea, blood in stool and abdominal distention.  Genitourinary: Negative for frequency and hematuria.  Musculoskeletal: Positive for arthralgias and back pain. Negative for gait problem, joint swelling and neck pain.  Skin: Negative for rash.  Neurological: Negative for dizziness, tremors, speech difficulty and weakness.  Psychiatric/Behavioral: Negative for sleep disturbance, dysphoric mood and agitation. The patient is not nervous/anxious.        Objective:   Physical Exam  Constitutional: He is oriented to person, place, and time. He appears well-developed.  HENT:  Mouth/Throat: Oropharynx is clear and moist.  Eyes: Conjunctivae are normal. Pupils are equal, round, and reactive to light.  Neck: Normal range of motion. No JVD present. No thyromegaly present.  Cardiovascular: Normal rate, regular rhythm, normal heart sounds and intact distal pulses.  Exam reveals no gallop and no friction rub.   No murmur heard. Pulmonary/Chest: Effort normal and breath sounds normal. No  respiratory distress. He has no wheezes. He has no rales. He exhibits no tenderness.  Abdominal: Soft. Bowel sounds are normal. He exhibits no distension and no mass. There is no tenderness. There is no rebound and no guarding.  Musculoskeletal: Normal range of motion. He exhibits tenderness (R radial wrist is tender). He exhibits no edema.  Lymphadenopathy:    He has no cervical adenopathy.  Neurological: He is alert and oriented to person, place, and time. He has normal reflexes. No cranial nerve deficit. He exhibits normal muscle tone. Coordination normal.  Skin: Skin is warm and dry. No rash noted.  Psychiatric: He has a normal mood and affect. His behavior is normal. Judgment and thought content normal.    R forearm with 10x3 cm ulcerated area on the dorsal mid-forearm w/fibrin coated ulcers  Lab Results  Component Value Date   WBC 5.1 03/01/2012   HGB 14.7 03/01/2012   HCT 44.1 03/01/2012   PLT 180.0 03/01/2012   GLUCOSE 99 10/12/2012   CHOL 180 03/01/2012   TRIG 194.0* 03/01/2012   HDL 51.00 03/01/2012   LDLDIRECT 157.5 07/05/2008   LDLCALC 90 03/01/2012   ALT 24 03/01/2012   AST 24 03/01/2012   NA 141 10/12/2012   K 4.4 10/12/2012   CL 106 10/12/2012   CREATININE 0.8 10/12/2012   BUN 18 10/12/2012   CO2 30 10/12/2012   TSH 0.78 03/01/2012   PSA 0.49 03/01/2012        Assessment & Plan:

## 2013-07-13 NOTE — Assessment & Plan Note (Signed)
Discussed.

## 2013-07-13 NOTE — Assessment & Plan Note (Addendum)
D/c Augmentin Start Doxy and Bactroban Dressed [place]an

## 2013-07-22 ENCOUNTER — Encounter: Payer: Self-pay | Admitting: Internal Medicine

## 2013-07-22 ENCOUNTER — Ambulatory Visit (INDEPENDENT_AMBULATORY_CARE_PROVIDER_SITE_OTHER): Payer: Medicare Other | Admitting: Internal Medicine

## 2013-07-22 VITALS — BP 142/80 | HR 80 | Temp 97.9°F | Resp 16 | Wt 233.0 lb

## 2013-07-22 DIAGNOSIS — IMO0002 Reserved for concepts with insufficient information to code with codable children: Secondary | ICD-10-CM

## 2013-07-22 DIAGNOSIS — L03113 Cellulitis of right upper limb: Secondary | ICD-10-CM

## 2013-07-22 MED ORDER — MUPIROCIN 2 % EX OINT: TOPICAL_OINTMENT | CUTANEOUS | Status: DC

## 2013-07-22 NOTE — Progress Notes (Signed)
R forearm erosion is decreased in size, clean granulating tissue, no cellulitis

## 2013-07-22 NOTE — Progress Notes (Signed)
Pre visit review using our clinic review tool, if applicable. No additional management support is needed unless otherwise documented below in the visit note. 

## 2013-07-22 NOTE — Assessment & Plan Note (Signed)
11/14 ulcerating lesions - better Re-dressed. Finish abx - doing well

## 2013-08-21 ENCOUNTER — Other Ambulatory Visit: Payer: Self-pay | Admitting: Internal Medicine

## 2013-08-31 ENCOUNTER — Ambulatory Visit (INDEPENDENT_AMBULATORY_CARE_PROVIDER_SITE_OTHER): Payer: Medicare Other | Admitting: Internal Medicine

## 2013-08-31 ENCOUNTER — Encounter: Payer: Self-pay | Admitting: Internal Medicine

## 2013-08-31 VITALS — BP 150/80 | HR 80 | Temp 97.2°F | Resp 16 | Wt 228.0 lb

## 2013-08-31 DIAGNOSIS — F4321 Adjustment disorder with depressed mood: Secondary | ICD-10-CM

## 2013-08-31 DIAGNOSIS — J069 Acute upper respiratory infection, unspecified: Secondary | ICD-10-CM | POA: Insufficient documentation

## 2013-08-31 DIAGNOSIS — G4700 Insomnia, unspecified: Secondary | ICD-10-CM

## 2013-08-31 MED ORDER — PROMETHAZINE-CODEINE 6.25-10 MG/5ML PO SYRP: 5.0000 mL | ORAL_SOLUTION | ORAL | Status: DC | PRN

## 2013-08-31 MED ORDER — AZITHROMYCIN 250 MG PO TABS: ORAL_TABLET | ORAL | Status: DC

## 2013-08-31 MED ORDER — ZOLPIDEM TARTRATE 10 MG PO TABS: 5.0000 mg | ORAL_TABLET | Freq: Every evening | ORAL | Status: DC | PRN

## 2013-08-31 NOTE — Progress Notes (Signed)
Patient ID: [name], male   DOB: Apr 17, 1941, 73 y.o.   MRN: [id number]   Subjective:    URI  This is a new problem. The current episode started in the past 7 days. The problem has been gradually worsening. The maximum temperature recorded prior to his arrival was 100 - 100.9 F. Associated symptoms include chest pain, congestion, coughing, headaches, joint pain and rhinorrhea. Pertinent negatives include no abdominal pain, diarrhea, nausea, neck pain, rash, sneezing or sore throat. He has tried acetaminophen and decongestant for the symptoms. The treatment provided mild relief.     The patient presents for a follow-up of  chronic hypertension, chronic dyslipidemia, CAD controlled with medicines. Wife is very sick w/CHF  Wt Readings from Last 3 Encounters:  08/31/13 228 lb (103.42 kg)  07/22/13 233 lb (105.688 kg)  07/13/13 237 lb (107.502 kg)   BP Readings from Last 3 Encounters:  08/31/13 150/80  07/22/13 142/80  07/13/13 118/80      Review of Systems  Constitutional: Negative for appetite change, fatigue and unexpected weight change.  HENT: Positive for congestion and rhinorrhea. Negative for nosebleeds, sneezing, sore throat and trouble swallowing.   Eyes: Negative for itching and visual disturbance.  Respiratory: Positive for cough and chest tightness.   Cardiovascular: Positive for chest pain. Negative for palpitations and leg swelling.  Gastrointestinal: Negative for nausea, abdominal pain, diarrhea, blood in stool and abdominal distention.  Genitourinary: Negative for frequency and hematuria.  Musculoskeletal: Positive for arthralgias, back pain and joint pain. Negative for gait problem, joint swelling and neck pain.  Skin: Negative for rash.  Neurological: Positive for headaches. Negative for dizziness, tremors, speech difficulty and weakness.  Psychiatric/Behavioral: Negative for sleep disturbance, dysphoric mood and agitation. The patient is not  nervous/anxious.        Objective:   Physical Exam  Constitutional: He is oriented to person, place, and time. He appears well-developed.  HENT:  Mouth/Throat: Oropharynx is clear and moist. No oropharyngeal exudate.  eryth throat  Eyes: Conjunctivae are normal. Pupils are equal, round, and reactive to light.  Neck: Normal range of motion. No JVD present. No thyromegaly present.  Cardiovascular: Normal rate, regular rhythm, normal heart sounds and intact distal pulses.  Exam reveals no gallop and no friction rub.   No murmur heard. Pulmonary/Chest: Effort normal and breath sounds normal. No respiratory distress. He has no wheezes. He has no rales. He exhibits no tenderness.  Abdominal: Soft. Bowel sounds are normal. He exhibits no distension and no mass. There is no tenderness. There is no rebound and no guarding.  Musculoskeletal: Normal range of motion. He exhibits tenderness (R radial wrist is tender). He exhibits no edema.  Lymphadenopathy:    He has no cervical adenopathy.  Neurological: He is alert and oriented to person, place, and time. He has normal reflexes. No cranial nerve deficit. He exhibits normal muscle tone. Coordination normal.  Skin: Skin is warm and dry. No rash noted.  Psychiatric: He has a normal mood and affect. His behavior is normal. Judgment and thought content normal.    Lab Results  Component Value Date   WBC 5.1 03/01/2012   HGB 14.7 03/01/2012   HCT 44.1 03/01/2012   PLT 180.0 03/01/2012   GLUCOSE 99 10/12/2012   CHOL 180 03/01/2012   TRIG 194.0* 03/01/2012   HDL 51.00 03/01/2012   LDLDIRECT 157.5 07/05/2008   LDLCALC 90 03/01/2012   ALT 24 03/01/2012   AST 24 03/01/2012   NA 141 10/12/2012  K 4.4 10/12/2012   CL 106 10/12/2012   CREATININE 0.8 10/12/2012   BUN 18 10/12/2012   CO2 30 10/12/2012   TSH 0.78 03/01/2012   PSA 0.49 03/01/2012        Assessment & Plan:

## 2013-08-31 NOTE — Assessment & Plan Note (Signed)
Discussed.

## 2013-08-31 NOTE — Assessment & Plan Note (Signed)
Zolpidem prn 

## 2013-08-31 NOTE — Assessment & Plan Note (Signed)
Prom-cod syr Zpac if worse 

## 2013-08-31 NOTE — Progress Notes (Signed)
Pre visit review using our clinic review tool, if applicable. No additional management support is needed unless otherwise documented below in the visit note. 

## 2013-11-07 ENCOUNTER — Other Ambulatory Visit (INDEPENDENT_AMBULATORY_CARE_PROVIDER_SITE_OTHER): Payer: Medicare Other

## 2013-11-07 ENCOUNTER — Ambulatory Visit (INDEPENDENT_AMBULATORY_CARE_PROVIDER_SITE_OTHER): Payer: Medicare Other | Admitting: Internal Medicine

## 2013-11-07 ENCOUNTER — Encounter: Payer: Self-pay | Admitting: Internal Medicine

## 2013-11-07 VITALS — BP 130/80 | HR 71 | Temp 97.4°F | Ht 72.0 in | Wt 238.0 lb

## 2013-11-07 DIAGNOSIS — F4321 Adjustment disorder with depressed mood: Secondary | ICD-10-CM

## 2013-11-07 DIAGNOSIS — Z23 Encounter for immunization: Secondary | ICD-10-CM

## 2013-11-07 DIAGNOSIS — I10 Essential (primary) hypertension: Secondary | ICD-10-CM

## 2013-11-07 DIAGNOSIS — M79609 Pain in unspecified limb: Secondary | ICD-10-CM

## 2013-11-07 DIAGNOSIS — I2510 Atherosclerotic heart disease of native coronary artery without angina pectoris: Secondary | ICD-10-CM

## 2013-11-07 DIAGNOSIS — M545 Low back pain, unspecified: Secondary | ICD-10-CM

## 2013-11-07 LAB — HEPATIC FUNCTION PANEL
ALT: 18 U/L (ref 0–53)
AST: 21 U/L (ref 0–37)
Albumin: 4.1 g/dL (ref 3.5–5.2)
Alkaline Phosphatase: 61 U/L (ref 39–117)
Bilirubin, Direct: 0.1 mg/dL (ref 0.0–0.3)
Total Bilirubin: 0.9 mg/dL (ref 0.3–1.2)
Total Protein: 6.8 g/dL (ref 6.0–8.3)

## 2013-11-07 LAB — LIPID PANEL
Cholesterol: 162 mg/dL (ref 0–200)
HDL: 57.3 mg/dL (ref >39.00)
LDL Cholesterol: 80 mg/dL (ref 0–99)
Total CHOL/HDL Ratio: 3
Triglycerides: 122 mg/dL (ref 0.0–149.0)
VLDL: 24.4 mg/dL (ref 0.0–40.0)

## 2013-11-07 LAB — BASIC METABOLIC PANEL
BUN: 19 mg/dL (ref 6–23)
CO2: 30 mEq/L (ref 19–32)
Calcium: 9.3 mg/dL (ref 8.4–10.5)
Chloride: 105 mEq/L (ref 96–112)
Creatinine, Ser: 0.7 mg/dL (ref 0.4–1.5)
GFR: 119.58 mL/min (ref >60.00)
Glucose, Bld: 85 mg/dL (ref 70–99)
Potassium: 4.3 mEq/L (ref 3.5–5.1)
Sodium: 140 mEq/L (ref 135–145)

## 2013-11-07 MED ORDER — DULOXETINE HCL 60 MG PO CPEP: 60.0000 mg | ORAL_CAPSULE | Freq: Every day | ORAL | Status: DC

## 2013-11-07 MED ORDER — SILDENAFIL CITRATE 100 MG PO TABS: 100.0000 mg | ORAL_TABLET | ORAL | Status: DC | PRN

## 2013-11-07 NOTE — Assessment & Plan Note (Signed)
Continue with current prescription therapy as reflected on the Med list.  

## 2013-11-07 NOTE — Progress Notes (Signed)
   Subjective:    HPI   The patient presents for a follow-up of  chronic hypertension, chronic dyslipidemia, CAD controlled with medicines. Wife is very sick w/CHF  Wt Readings from Last 3 Encounters:  11/07/13 238 lb (107.956 kg)  08/31/13 228 lb (103.42 kg)  07/22/13 233 lb (105.688 kg)   BP Readings from Last 3 Encounters:  11/07/13 130/80  08/31/13 150/80  07/22/13 142/80      Review of Systems  Constitutional: Negative for appetite change, fatigue and unexpected weight change.  HENT: Negative for nosebleeds and trouble swallowing.   Eyes: Negative for itching and visual disturbance.  Respiratory: Positive for chest tightness.   Cardiovascular: Negative for palpitations and leg swelling.  Gastrointestinal: Negative for blood in stool and abdominal distention.  Genitourinary: Negative for frequency and hematuria.  Musculoskeletal: Positive for arthralgias and back pain. Negative for gait problem and joint swelling.  Neurological: Negative for dizziness, tremors, speech difficulty and weakness.  Psychiatric/Behavioral: Negative for suicidal ideas, sleep disturbance, dysphoric mood and agitation. The patient is not nervous/anxious.        Objective:   Physical Exam  Constitutional: He is oriented to person, place, and time. He appears well-developed.  HENT:  Mouth/Throat: Oropharynx is clear and moist. No oropharyngeal exudate.  eryth throat  Eyes: Conjunctivae are normal. Pupils are equal, round, and reactive to light.  Neck: Normal range of motion. No JVD present. No thyromegaly present.  Cardiovascular: Normal rate, regular rhythm, normal heart sounds and intact distal pulses.  Exam reveals no gallop and no friction rub.   No murmur heard. Pulmonary/Chest: Effort normal and breath sounds normal. No respiratory distress. He has no wheezes. He has no rales. He exhibits no tenderness.  Abdominal: Soft. Bowel sounds are normal. He exhibits no distension and no mass.  There is no tenderness. There is no rebound and no guarding.  Musculoskeletal: Normal range of motion. He exhibits tenderness (R radial wrist is tender). He exhibits no edema.  Lymphadenopathy:    He has no cervical adenopathy.  Neurological: He is alert and oriented to person, place, and time. He has normal reflexes. No cranial nerve deficit. He exhibits normal muscle tone. Coordination normal.  Skin: Skin is warm and dry. No rash noted.  Psychiatric: He has a normal mood and affect. His behavior is normal. Judgment and thought content normal.    Lab Results  Component Value Date   WBC 5.1 03/01/2012   HGB 14.7 03/01/2012   HCT 44.1 03/01/2012   PLT 180.0 03/01/2012   GLUCOSE 99 10/12/2012   CHOL 180 03/01/2012   TRIG 194.0* 03/01/2012   HDL 51.00 03/01/2012   LDLDIRECT 157.5 07/05/2008   LDLCALC 90 03/01/2012   ALT 24 03/01/2012   AST 24 03/01/2012   NA 141 10/12/2012   K 4.4 10/12/2012   CL 106 10/12/2012   CREATININE 0.8 10/12/2012   BUN 18 10/12/2012   CO2 30 10/12/2012   TSH 0.78 03/01/2012   PSA 0.49 03/01/2012        Assessment & Plan:

## 2013-11-07 NOTE — Progress Notes (Signed)
Pre visit review using our clinic review tool, if applicable. No additional management support is needed unless otherwise documented below in the visit note. 

## 2013-11-08 LAB — TSH: TSH: 0.57 u[IU]/mL (ref 0.35–5.50)

## 2013-11-11 ENCOUNTER — Ambulatory Visit: Payer: Medicare Other | Admitting: Internal Medicine

## 2013-11-18 ENCOUNTER — Ambulatory Visit: Payer: Medicare Other | Admitting: Internal Medicine

## 2014-01-24 ENCOUNTER — Ambulatory Visit (INDEPENDENT_AMBULATORY_CARE_PROVIDER_SITE_OTHER): Payer: Medicare Other | Admitting: Internal Medicine

## 2014-01-24 ENCOUNTER — Other Ambulatory Visit (INDEPENDENT_AMBULATORY_CARE_PROVIDER_SITE_OTHER): Payer: Medicare Other

## 2014-01-24 ENCOUNTER — Encounter: Payer: Self-pay | Admitting: Internal Medicine

## 2014-01-24 VITALS — BP 128/70 | HR 76 | Temp 98.3°F | Resp 16 | Wt 242.0 lb

## 2014-01-24 DIAGNOSIS — R29898 Other symptoms and signs involving the musculoskeletal system: Secondary | ICD-10-CM

## 2014-01-24 DIAGNOSIS — R209 Unspecified disturbances of skin sensation: Secondary | ICD-10-CM

## 2014-01-24 DIAGNOSIS — N529 Male erectile dysfunction, unspecified: Secondary | ICD-10-CM

## 2014-01-24 DIAGNOSIS — M1990 Unspecified osteoarthritis, unspecified site: Secondary | ICD-10-CM

## 2014-01-24 DIAGNOSIS — R609 Edema, unspecified: Secondary | ICD-10-CM

## 2014-01-24 DIAGNOSIS — M545 Low back pain, unspecified: Secondary | ICD-10-CM

## 2014-01-24 DIAGNOSIS — E785 Hyperlipidemia, unspecified: Secondary | ICD-10-CM

## 2014-01-24 DIAGNOSIS — I10 Essential (primary) hypertension: Secondary | ICD-10-CM

## 2014-01-24 DIAGNOSIS — I2510 Atherosclerotic heart disease of native coronary artery without angina pectoris: Secondary | ICD-10-CM

## 2014-01-24 DIAGNOSIS — F4321 Adjustment disorder with depressed mood: Secondary | ICD-10-CM

## 2014-01-24 LAB — CBC WITH DIFFERENTIAL/PLATELET
Basophils Absolute: 0 10*3/uL (ref 0.0–0.1)
Basophils Relative: 1 % (ref 0.0–3.0)
Eosinophils Absolute: 0.2 10*3/uL (ref 0.0–0.7)
Eosinophils Relative: 5.2 % — ABNORMAL HIGH (ref 0.0–5.0)
HCT: 23.1 % — CL (ref 39.0–52.0)
Hemoglobin: 7 g/dL — CL (ref 13.0–17.0)
Lymphocytes Relative: 29.1 % (ref 12.0–46.0)
Lymphs Abs: 1.3 10*3/uL (ref 0.7–4.0)
MCHC: 30.1 g/dL (ref 30.0–36.0)
MCV: 77.3 fl — ABNORMAL LOW (ref 78.0–100.0)
Monocytes Absolute: 0.8 10*3/uL (ref 0.1–1.0)
Monocytes Relative: 18 % — ABNORMAL HIGH (ref 3.0–12.0)
Neutro Abs: 2.1 10*3/uL (ref 1.4–7.7)
Neutrophils Relative %: 46.7 % (ref 43.0–77.0)
Platelets: 236 10*3/uL (ref 150.0–400.0)
RBC: 2.99 Mil/uL — ABNORMAL LOW (ref 4.22–5.81)
RDW: 18.5 % — ABNORMAL HIGH (ref 11.5–15.5)
WBC: 4.4 10*3/uL (ref 4.0–10.5)

## 2014-01-24 LAB — BASIC METABOLIC PANEL
BUN: 17 mg/dL (ref 6–23)
CO2: 28 mEq/L (ref 19–32)
Calcium: 9.3 mg/dL (ref 8.4–10.5)
Chloride: 108 mEq/L (ref 96–112)
Creatinine, Ser: 0.7 mg/dL (ref 0.4–1.5)
GFR: 117.54 mL/min (ref >60.00)
Glucose, Bld: 95 mg/dL (ref 70–99)
Potassium: 4.6 mEq/L (ref 3.5–5.1)
Sodium: 141 mEq/L (ref 135–145)

## 2014-01-24 LAB — VITAMIN B12: Vitamin B-12: 194 pg/mL — ABNORMAL LOW (ref 211–911)

## 2014-01-24 LAB — HEPATIC FUNCTION PANEL
ALT: 12 U/L (ref 0–53)
AST: 16 U/L (ref 0–37)
Albumin: 3.8 g/dL (ref 3.5–5.2)
Alkaline Phosphatase: 57 U/L (ref 39–117)
Bilirubin, Direct: 0.1 mg/dL (ref 0.0–0.3)
Total Bilirubin: 0.8 mg/dL (ref 0.2–1.2)
Total Protein: 6.6 g/dL (ref 6.0–8.3)

## 2014-01-24 LAB — CK: Total CK: 61 U/L (ref 7–232)

## 2014-01-24 LAB — SEDIMENTATION RATE: Sed Rate: 33 mm/hr — ABNORMAL HIGH (ref 0–22)

## 2014-01-24 LAB — TSH: TSH: 0.65 u[IU]/mL (ref 0.35–4.50)

## 2014-01-24 MED ORDER — FUROSEMIDE 20 MG PO TABS: 20.0000 mg | ORAL_TABLET | Freq: Every day | ORAL | Status: DC | PRN

## 2014-01-24 MED ORDER — AVANAFIL 100 MG PO TABS: 100.0000 mg | ORAL_TABLET | Freq: Every day | ORAL | Status: DC | PRN

## 2014-01-24 MED ORDER — DULOXETINE HCL 60 MG PO CPEP: 60.0000 mg | ORAL_CAPSULE | Freq: Every day | ORAL | Status: DC

## 2014-01-24 NOTE — Assessment & Plan Note (Signed)
Continue with current prescription therapy as reflected on the Med list.  

## 2014-01-24 NOTE — Assessment & Plan Note (Signed)
Labs

## 2014-01-24 NOTE — Assessment & Plan Note (Signed)
6/15 ?etiology: statin effect vs spinal stenosis vs other Hold Simvastatin Labs

## 2014-01-24 NOTE — Assessment & Plan Note (Addendum)
6/15 B LE - new Labs Lasix prn

## 2014-01-24 NOTE — Assessment & Plan Note (Signed)
Better  

## 2014-01-24 NOTE — Patient Instructions (Signed)
Hold simvastatin.  

## 2014-01-24 NOTE — Progress Notes (Signed)
   Subjective:    HPI  C/o weakness in B thighs when walking x x 2-3 months; they feel "tired". No LBP. No leg pain. The patient presents for a follow-up of  chronic hypertension, chronic dyslipidemia, CAD controlled with medicines. Wife is very sick w/CHF  Wt Readings from Last 3 Encounters:  01/24/14 242 lb (109.77 kg)  11/07/13 238 lb (107.956 kg)  08/31/13 228 lb (103.42 kg)   BP Readings from Last 3 Encounters:  01/24/14 128/70  11/07/13 130/80  08/31/13 150/80      Review of Systems  Constitutional: Negative for appetite change, fatigue and unexpected weight change.  HENT: Negative for nosebleeds and trouble swallowing.   Eyes: Negative for itching and visual disturbance.  Respiratory: Positive for chest tightness.   Cardiovascular: Negative for palpitations and leg swelling.  Gastrointestinal: Negative for blood in stool and abdominal distention.  Genitourinary: Negative for frequency and hematuria.  Musculoskeletal: Positive for arthralgias and back pain. Negative for gait problem and joint swelling.  Neurological: Negative for dizziness, tremors, speech difficulty and weakness.  Psychiatric/Behavioral: Negative for suicidal ideas, sleep disturbance, dysphoric mood and agitation. The patient is not nervous/anxious.        Objective:   Physical Exam  Constitutional: He is oriented to person, place, and time. He appears well-developed.  HENT:  Mouth/Throat: Oropharynx is clear and moist. No oropharyngeal exudate.  eryth throat  Eyes: Conjunctivae are normal. Pupils are equal, round, and reactive to light.  Neck: Normal range of motion. No JVD present. No thyromegaly present.  Cardiovascular: Normal rate, regular rhythm, normal heart sounds and intact distal pulses.  Exam reveals no gallop and no friction rub.   No murmur heard. Pulmonary/Chest: Effort normal and breath sounds normal. No respiratory distress. He has no wheezes. He has no rales. He exhibits no  tenderness.  Abdominal: Soft. Bowel sounds are normal. He exhibits no distension and no mass. There is no tenderness. There is no rebound and no guarding.  Musculoskeletal: Normal range of motion. He exhibits edema (1+) and tenderness (R radial wrist is tender).  Lymphadenopathy:    He has no cervical adenopathy.  Neurological: He is alert and oriented to person, place, and time. He has normal reflexes. No cranial nerve deficit. He exhibits normal muscle tone. Coordination normal.  Skin: Skin is warm and dry. No rash noted.  Psychiatric: He has a normal mood and affect. His behavior is normal. Judgment and thought content normal.  Tanned but ?pale skin  Lab Results  Component Value Date   WBC 5.1 03/01/2012   HGB 14.7 03/01/2012   HCT 44.1 03/01/2012   PLT 180.0 03/01/2012   GLUCOSE 85 11/07/2013   CHOL 162 11/07/2013   TRIG 122.0 11/07/2013   HDL 57.30 11/07/2013   LDLDIRECT 157.5 07/05/2008   LDLCALC 80 11/07/2013   ALT 18 11/07/2013   AST 21 11/07/2013   NA 140 11/07/2013   K 4.3 11/07/2013   CL 105 11/07/2013   CREATININE 0.7 11/07/2013   BUN 19 11/07/2013   CO2 30 11/07/2013   TSH 0.57 11/07/2013   PSA 0.49 03/01/2012        Assessment & Plan:

## 2014-01-24 NOTE — Assessment & Plan Note (Signed)
Will try [facility]

## 2014-01-24 NOTE — Assessment & Plan Note (Signed)
Hold Zocor 

## 2014-01-24 NOTE — Assessment & Plan Note (Signed)
Doing well after last surgery in 2010

## 2014-01-24 NOTE — Progress Notes (Signed)
Pre visit review using our clinic review tool, if applicable. No additional management support is needed unless otherwise documented below in the visit note. 

## 2014-01-25 ENCOUNTER — Telehealth: Payer: Self-pay | Admitting: Internal Medicine

## 2014-01-25 NOTE — Telephone Encounter (Signed)
Relevant patient education mailed to patient.  

## 2014-02-08 ENCOUNTER — Ambulatory Visit: Payer: Medicare Other | Admitting: Internal Medicine

## 2014-02-21 ENCOUNTER — Encounter: Payer: Self-pay | Admitting: Internal Medicine

## 2014-02-21 ENCOUNTER — Observation Stay (HOSPITAL_COMMUNITY)
Admission: EM | Admit: 2014-02-21 | Discharge: 2014-02-24 | Disposition: A | Discharge status: Home / Self Care | Payer: Medicare Other | Attending: Internal Medicine | Admitting: Internal Medicine

## 2014-02-21 ENCOUNTER — Encounter (HOSPITAL_COMMUNITY): Payer: Self-pay | Admitting: Emergency Medicine

## 2014-02-21 ENCOUNTER — Other Ambulatory Visit (INDEPENDENT_AMBULATORY_CARE_PROVIDER_SITE_OTHER): Payer: Medicare Other

## 2014-02-21 ENCOUNTER — Emergency Department (HOSPITAL_COMMUNITY): Payer: Medicare Other

## 2014-02-21 ENCOUNTER — Encounter (HOSPITAL_COMMUNITY): Payer: Self-pay | Admitting: *Deleted

## 2014-02-21 ENCOUNTER — Ambulatory Visit (INDEPENDENT_AMBULATORY_CARE_PROVIDER_SITE_OTHER): Payer: Medicare Other | Admitting: Internal Medicine

## 2014-02-21 VITALS — BP 130/70 | HR 76 | Temp 98.3°F | Resp 16 | Wt 239.0 lb

## 2014-02-21 DIAGNOSIS — D509 Iron deficiency anemia, unspecified: Principal | ICD-10-CM | POA: Insufficient documentation

## 2014-02-21 DIAGNOSIS — R29898 Other symptoms and signs involving the musculoskeletal system: Secondary | ICD-10-CM

## 2014-02-21 DIAGNOSIS — G4733 Obstructive sleep apnea (adult) (pediatric): Secondary | ICD-10-CM | POA: Insufficient documentation

## 2014-02-21 DIAGNOSIS — E538 Deficiency of other specified B group vitamins: Secondary | ICD-10-CM

## 2014-02-21 DIAGNOSIS — R5381 Other malaise: Secondary | ICD-10-CM

## 2014-02-21 DIAGNOSIS — Z96659 Presence of unspecified artificial knee joint: Secondary | ICD-10-CM | POA: Insufficient documentation

## 2014-02-21 DIAGNOSIS — D649 Anemia, unspecified: Secondary | ICD-10-CM

## 2014-02-21 DIAGNOSIS — Z981 Arthrodesis status: Secondary | ICD-10-CM | POA: Insufficient documentation

## 2014-02-21 DIAGNOSIS — I2510 Atherosclerotic heart disease of native coronary artery without angina pectoris: Secondary | ICD-10-CM | POA: Insufficient documentation

## 2014-02-21 DIAGNOSIS — Z79899 Other long term (current) drug therapy: Secondary | ICD-10-CM | POA: Insufficient documentation

## 2014-02-21 DIAGNOSIS — Z7982 Long term (current) use of aspirin: Secondary | ICD-10-CM | POA: Insufficient documentation

## 2014-02-21 DIAGNOSIS — E785 Hyperlipidemia, unspecified: Secondary | ICD-10-CM | POA: Diagnosis present

## 2014-02-21 DIAGNOSIS — R609 Edema, unspecified: Secondary | ICD-10-CM | POA: Insufficient documentation

## 2014-02-21 DIAGNOSIS — I10 Essential (primary) hypertension: Secondary | ICD-10-CM | POA: Insufficient documentation

## 2014-02-21 DIAGNOSIS — Z87891 Personal history of nicotine dependence: Secondary | ICD-10-CM | POA: Insufficient documentation

## 2014-02-21 DIAGNOSIS — N400 Enlarged prostate without lower urinary tract symptoms: Secondary | ICD-10-CM | POA: Insufficient documentation

## 2014-02-21 DIAGNOSIS — M1990 Unspecified osteoarthritis, unspecified site: Secondary | ICD-10-CM | POA: Insufficient documentation

## 2014-02-21 DIAGNOSIS — R195 Other fecal abnormalities: Secondary | ICD-10-CM | POA: Insufficient documentation

## 2014-02-21 DIAGNOSIS — R5383 Other fatigue: Secondary | ICD-10-CM

## 2014-02-21 DIAGNOSIS — G4700 Insomnia, unspecified: Secondary | ICD-10-CM | POA: Diagnosis present

## 2014-02-21 LAB — CBC WITH DIFFERENTIAL/PLATELET
Basophils Absolute: 0 10*3/uL (ref 0.0–0.1)
Basophils Absolute: 0.1 10*3/uL (ref 0.0–0.1)
Basophils Absolute: 0.1 10*3/uL (ref 0.0–0.1)
Basophils Relative: 0.8 % (ref 0.0–3.0)
Basophils Relative: 1 % (ref 0–1)
Basophils Relative: 1 % (ref 0–1)
Eosinophils Absolute: 0.2 10*3/uL (ref 0.0–0.7)
Eosinophils Absolute: 0.2 10*3/uL (ref 0.0–0.7)
Eosinophils Absolute: 0.2 10*3/uL (ref 0.0–0.7)
Eosinophils Relative: 3.8 % (ref 0.0–5.0)
Eosinophils Relative: 4 % (ref 0–5)
Eosinophils Relative: 3 % (ref 0–5)
HCT: 22.1 % — CL (ref 39.0–52.0)
HCT: 22.1 % — ABNORMAL LOW (ref 39.0–52.0)
HCT: 21.7 % — ABNORMAL LOW (ref 39.0–52.0)
Hemoglobin: 6.8 g/dL — CL (ref 13.0–17.0)
Hemoglobin: 6.2 g/dL — CL (ref 13.0–17.0)
Hemoglobin: 6 g/dL — CL (ref 13.0–17.0)
Lymphocytes Relative: 28.2 % (ref 12.0–46.0)
Lymphocytes Relative: 34 % (ref 12–46)
Lymphocytes Relative: 35 % (ref 12–46)
Lymphs Abs: 1.3 10*3/uL (ref 0.7–4.0)
Lymphs Abs: 1.7 10*3/uL (ref 0.7–4.0)
Lymphs Abs: 1.8 10*3/uL (ref 0.7–4.0)
MCH: 20.7 pg — ABNORMAL LOW (ref 26.0–34.0)
MCH: 20.3 pg — ABNORMAL LOW (ref 26.0–34.0)
MCHC: 30.5 g/dL (ref 30.0–36.0)
MCHC: 28.1 g/dL — ABNORMAL LOW (ref 30.0–36.0)
MCHC: 27.6 g/dL — ABNORMAL LOW (ref 30.0–36.0)
MCV: 70 fl — ABNORMAL LOW (ref 78.0–100.0)
MCV: 73.9 fL — ABNORMAL LOW (ref 78.0–100.0)
MCV: 73.6 fL — ABNORMAL LOW (ref 78.0–100.0)
Monocytes Absolute: 0.8 10*3/uL (ref 0.1–1.0)
Monocytes Absolute: 0.7 10*3/uL (ref 0.1–1.0)
Monocytes Absolute: 0.7 10*3/uL (ref 0.1–1.0)
Monocytes Relative: 17.8 % — ABNORMAL HIGH (ref 3.0–12.0)
Monocytes Relative: 14 % — ABNORMAL HIGH (ref 3–12)
Monocytes Relative: 13 % — ABNORMAL HIGH (ref 3–12)
Neutro Abs: 2.2 10*3/uL (ref 1.4–7.7)
Neutro Abs: 2.3 10*3/uL (ref 1.7–7.7)
Neutro Abs: 2.3 10*3/uL (ref 1.7–7.7)
Neutrophils Relative %: 49.4 % (ref 43.0–77.0)
Neutrophils Relative %: 47 % (ref 43–77)
Neutrophils Relative %: 48 % (ref 43–77)
Platelets: 262 10*3/uL (ref 150.0–400.0)
Platelets: 237 10*3/uL (ref 150–400)
Platelets: 221 10*3/uL (ref 150–400)
RBC: 3.17 Mil/uL — ABNORMAL LOW (ref 4.22–5.81)
RBC: 2.99 MIL/uL — ABNORMAL LOW (ref 4.22–5.81)
RBC: 2.95 MIL/uL — ABNORMAL LOW (ref 4.22–5.81)
RDW: 19.4 % — ABNORMAL HIGH (ref 11.5–15.5)
RDW: 17 % — ABNORMAL HIGH (ref 11.5–15.5)
RDW: 17 % — ABNORMAL HIGH (ref 11.5–15.5)
WBC: 4.4 10*3/uL (ref 4.0–10.5)
WBC: 5 10*3/uL (ref 4.0–10.5)
WBC: 5.1 10*3/uL (ref 4.0–10.5)

## 2014-02-21 LAB — TROPONIN I
Troponin I: <0.3 ng/mL (ref <0.30)
Troponin I: <0.3 ng/mL (ref <0.30)

## 2014-02-21 LAB — COMPREHENSIVE METABOLIC PANEL
ALT: 9 U/L (ref 0–53)
AST: 18 U/L (ref 0–37)
Albumin: 3.7 g/dL (ref 3.5–5.2)
Alkaline Phosphatase: 60 U/L (ref 39–117)
BUN: 15 mg/dL (ref 6–23)
CO2: 26 mEq/L (ref 19–32)
Calcium: 9.1 mg/dL (ref 8.4–10.5)
Chloride: 106 mEq/L (ref 96–112)
Creatinine, Ser: 0.68 mg/dL (ref 0.50–1.35)
GFR calc Af Amer: >90 mL/min (ref >90)
GFR calc non Af Amer: >90 mL/min (ref >90)
Glucose, Bld: 88 mg/dL (ref 70–99)
Potassium: 4 mEq/L (ref 3.7–5.3)
Sodium: 143 mEq/L (ref 137–147)
Total Bilirubin: 0.3 mg/dL (ref 0.3–1.2)
Total Protein: 6.5 g/dL (ref 6.0–8.3)

## 2014-02-21 LAB — TSH: TSH: 1.29 u[IU]/mL (ref 0.350–4.500)

## 2014-02-21 LAB — PREPARE RBC (CROSSMATCH)

## 2014-02-21 LAB — RETICULOCYTES
RBC.: 2.99 MIL/uL — ABNORMAL LOW (ref 4.22–5.81)
Retic Count, Absolute: 59.8 10*3/uL (ref 19.0–186.0)
Retic Ct Pct: 2 % (ref 0.4–3.1)

## 2014-02-21 LAB — PRO B NATRIURETIC PEPTIDE: Pro B Natriuretic peptide (BNP): 40.5 pg/mL (ref 0–125)

## 2014-02-21 LAB — IBC PANEL
Iron: 9 ug/dL — ABNORMAL LOW (ref 42–165)
Saturation Ratios: 1.9 % — ABNORMAL LOW (ref 20.0–50.0)
Transferrin: 338.3 mg/dL (ref 212.0–360.0)

## 2014-02-21 LAB — FOLATE: Folate: 16 ng/mL (ref >5.9)

## 2014-02-21 LAB — POC OCCULT BLOOD, ED: Fecal Occult Bld: POSITIVE — AB

## 2014-02-21 MED ORDER — SIMVASTATIN 40 MG PO TABS
40.0000 mg | ORAL_TABLET | Freq: Every day | ORAL | Status: DC
  Administered 2014-02-22 – 2014-02-23 (×3): 40 mg via ORAL
  Filled 2014-02-21 (×4): qty 1

## 2014-02-21 MED ORDER — ZOLPIDEM TARTRATE 5 MG PO TABS: 5.0000 mg | ORAL_TABLET | Freq: Every evening | ORAL | Status: DC | PRN

## 2014-02-21 MED ORDER — VITAMIN B-12 1000 MCG SL SUBL: 1.0000 | SUBLINGUAL_TABLET | Freq: Every day | SUBLINGUAL | Status: DC

## 2014-02-21 MED ORDER — SODIUM CHLORIDE 0.9 % IJ SOLN
3.0000 mL | Freq: Two times a day (BID) | INTRAMUSCULAR | Status: DC
  Administered 2014-02-22 – 2014-02-23 (×3): 3 mL via INTRAVENOUS
  Administered 2014-02-23: 11:00:00 via INTRAVENOUS

## 2014-02-21 MED ORDER — POLYSACCHARIDE IRON COMPLEX 150 MG PO CAPS: 150.0000 mg | ORAL_CAPSULE | Freq: Every day | ORAL | Status: DC

## 2014-02-21 MED ORDER — TAMSULOSIN HCL 0.4 MG PO CAPS
0.4000 mg | ORAL_CAPSULE | Freq: Every day | ORAL | Status: DC
  Administered 2014-02-22 – 2014-02-24 (×3): 0.4 mg via ORAL
  Filled 2014-02-21 (×4): qty 1

## 2014-02-21 MED ORDER — FOLIC ACID 400 MCG PO TABS: 400.0000 ug | ORAL_TABLET | Freq: Every day | ORAL | Status: DC

## 2014-02-21 MED ORDER — POLYSACCHARIDE IRON COMPLEX 150 MG PO CAPS
150.0000 mg | ORAL_CAPSULE | Freq: Every day | ORAL | Status: DC
  Filled 2014-02-21: qty 1

## 2014-02-21 MED ORDER — DULOXETINE HCL 60 MG PO CPEP
60.0000 mg | ORAL_CAPSULE | Freq: Every day | ORAL | Status: DC
  Administered 2014-02-22 – 2014-02-24 (×4): 60 mg via ORAL
  Filled 2014-02-21 (×4): qty 1

## 2014-02-21 MED ORDER — FUROSEMIDE 20 MG PO TABS
20.0000 mg | ORAL_TABLET | Freq: Every day | ORAL | Status: DC | PRN
  Filled 2014-02-21: qty 2

## 2014-02-21 MED ORDER — CYANOCOBALAMIN 1000 MCG/ML IJ SOLN
1000.0000 ug | Freq: Once | INTRAMUSCULAR | Status: AC
  Administered 2014-02-21: 1000 ug via INTRAMUSCULAR

## 2014-02-21 MED ORDER — CHOLECALCIFEROL 25 MCG (1000 UT) PO TABS: 1000.0000 [IU] | ORAL_TABLET | Freq: Every day | ORAL | Status: DC

## 2014-02-21 MED ORDER — FOLIC ACID 1 MG PO TABS
1.0000 mg | ORAL_TABLET | Freq: Every day | ORAL | Status: DC
  Filled 2014-02-21: qty 1

## 2014-02-21 MED ORDER — FINASTERIDE 5 MG PO TABS
5.0000 mg | ORAL_TABLET | Freq: Every day | ORAL | Status: DC
  Administered 2014-02-22 – 2014-02-23 (×3): 5 mg via ORAL
  Filled 2014-02-21 (×4): qty 1

## 2014-02-21 MED ORDER — SODIUM CHLORIDE 0.9 % IV SOLN
INTRAVENOUS | Status: DC
  Administered 2014-02-22: 06:00:00 via INTRAVENOUS

## 2014-02-21 MED ORDER — VITAMIN D3 25 MCG (1000 UNIT) PO TABS
1000.0000 [IU] | ORAL_TABLET | Freq: Every day | ORAL | Status: DC
  Filled 2014-02-21: qty 1

## 2014-02-21 MED ORDER — VITAMIN B-12 1000 MCG PO TABS
1000.0000 ug | ORAL_TABLET | Freq: Every day | ORAL | Status: DC
  Filled 2014-02-21: qty 1

## 2014-02-21 NOTE — ED Provider Notes (Addendum)
CSN: [id number]     Arrival date & time 02/21/14  1930 History   First MD Initiated Contact with Patient 02/21/14 1953     Chief Complaint  Patient presents with  . Weakness  . Shortness of Breath  . Anemia      HPI  Patient is a 73 year old male who was instructed to come the emergency room by his physician because of anemia. PCP is Dr. Liborio Nixon at [facility].  He had routine labs obtained 2 weeks ago. His hemoglobin was 7. His home phone has apparently been out of order and did not receive a message about the abnormal lab.  He has been symptomatic with easy fatigue for several weeks. He normally rides his bike. Him to do so Sunday and states he could barely ride had to stop. She's noticed dependent edema for about 2 or 3 weeks as well. No dyspnea shortness of breath chest pain. No source of bleeding. No blood in his stools or urine. Had a routine colonoscopy within the last 5 years which she states was normal. No history of anemia.    Past Medical History  Diagnosis Date  . Hyperlipidemia   . Osteoarthritis   . LBP (low back pain)   . Insomnia   . CAD (coronary artery disease) 2000    Dr Gay Filler test negative  . OSA (obstructive sleep apnea)     lost 40 lb-does not snore-wife says he is not apnic now  . BPH (benign prostatic hyperplasia)   . CTS (carpal tunnel syndrome)     better  . HTN (hypertension)     no meds   Past Surgical History  Procedure Laterality Date  . Total knee arthroplasty  2003    Right  . Lumbar fusion  2007    [place], Dr Vertell Limber  . Carpal tunnel release      Dr Lonia Farber  . Tonsillectomy    . Eye surgery      both cataracts  . Colonoscopy     Family History  Problem Relation Age of Onset  . Aneurysm Father     AAA  . Cancer Brother   . Mental illness Brother     dementia  . Coronary artery disease Other   . Breast cancer Other   . Cancer Mother 61    breast cancer  . Heart disease Mother     MI   History  Substance  Use Topics  . Smoking status: Former Smoker    Quit date: 01/08/1987  . Smokeless tobacco: Not on file  . Alcohol Use: No     Comment: occasional    Review of Systems  Constitutional: Positive for fatigue. Negative for fever, chills, diaphoresis and appetite change.  HENT: Negative for mouth sores, sore throat and trouble swallowing.   Eyes: Negative for visual disturbance.  Respiratory: Positive for shortness of breath. Negative for cough, chest tightness and wheezing.   Cardiovascular: Positive for leg swelling. Negative for chest pain.  Gastrointestinal: Negative for nausea, vomiting, abdominal pain, diarrhea and abdominal distention.  Endocrine: Negative for polydipsia, polyphagia and polyuria.  Genitourinary: Negative for dysuria, frequency and hematuria.  Musculoskeletal: Negative for gait problem.  Skin: Negative for color change, pallor and rash.  Neurological: Positive for weakness. Negative for dizziness, syncope, light-headedness and headaches.  Hematological: Does not bruise/bleed easily.  Psychiatric/Behavioral: Negative for behavioral problems and confusion.      Allergies  Review of patient's allergies indicates no known allergies.  Home Medications  Prior to Admission medications   Medication Sig Start Date End Date Taking? Authorizing Joesiah Lonon  aspirin 325 MG tablet Take 325 mg by mouth at bedtime.     Historical Violette Morneault, MD  Avanafil (STENDRA) 100 MG TABS Take 100 mg by mouth daily as needed. 01/24/14   Aleksei [name] V, MD  Cholecalciferol 1000 UNITS tablet Take 1,000 Units by mouth daily.      Historical Shuntell Foody, MD  Cyanocobalamin (VITAMIN B-12) 1000 MCG SUBL Place 1 tablet (1,000 mcg total) under the tongue daily. 02/21/14   Aleksei [name] V, MD  DULoxetine (CYMBALTA) 60 MG capsule Take 1 capsule (60 mg total) by mouth daily. 01/24/14   Aleksei [name] V, MD  finasteride (PROSCAR) 5 MG tablet Take 5 mg by mouth at bedtime.    Historical Ulla Mckiernan, MD   fluticasone (FLONASE) 50 MCG/ACT nasal spray Place 2 sprays into both nostrils daily. 07/13/13 07/13/14  Aleksei [name] V, MD  folic acid (V-R FOLIC ACID) 035 MCG tablet Take 1 tablet (400 mcg total) by mouth daily. 02/21/14   Aleksei [name] V, MD  furosemide (LASIX) 20 MG tablet Take 1-2 tablets (20-40 mg total) by mouth daily as needed for edema. 01/24/14   Aleksei [name] V, MD  iron polysaccharides (NIFEREX) 150 MG capsule Take 1 capsule (150 mg total) by mouth daily. 02/21/14   Aleksei [name] V, MD  sildenafil (VIAGRA) 100 MG tablet Take 1 tablet (100 mg total) by mouth as needed for erectile dysfunction. 11/07/13   Aleksei [name] V, MD  simvastatin (ZOCOR) 40 MG tablet Take 40 mg by mouth at bedtime.    Historical July Linam, MD  tamsulosin (FLOMAX) 0.4 MG CAPS capsule TAKE 1 CAPSULE (0.4 MG TOTAL) BY MOUTH DAILY. 08/21/13   Aleksei [name] V, MD  zolpidem (AMBIEN) 10 MG tablet Take 0.5-1 tablets (5-10 mg total) by mouth at bedtime as needed. 08/31/13   Aleksei [name] V, MD   BP 138/52  Pulse 65  Temp(Src) 97.4 F (36.3 C) (Oral)  Resp 21  SpO2 99% Physical Exam  Constitutional: He is oriented to person, place, and time. He appears well-developed and well-nourished. No distress.  HENT:  Head: Normocephalic.  Eyes: Conjunctivae are normal. Pupils are equal, round, and reactive to light. No scleral icterus.  Conjunctiva are pale  Neck: Normal range of motion. Neck supple. No thyromegaly present.  Cardiovascular: Normal rate and regular rhythm.  Exam reveals no gallop and no friction rub.   No murmur heard. Pulmonary/Chest: Effort normal and breath sounds normal. No respiratory distress. He has no wheezes. He has no rales.  Abdominal: Soft. Bowel sounds are normal. He exhibits no distension. There is no tenderness. There is no rebound.  Musculoskeletal: Normal range of motion.  Neurological: He is alert and oriented to person, place, and time.  Skin: Skin is warm and dry.  No rash noted.  Psychiatric: He has a normal mood and affect. His behavior is normal.    ED Course  CRITICAL CARE Performed by: Tanna Furry Authorized by: Tanna Furry Total critical care time: 60 minutes Critical care start time: 02/21/2014 10:10 PM Critical care end time: 02/21/2014 11:10 PM Critical care time was exclusive of separately billable procedures and treating other patients. Critical care was necessary to treat or prevent imminent or life-threatening deterioration of the following conditions: Symptomatic anemia requiring transfusion. Critical care was time spent personally by me on the following activities: blood draw for specimens, development of treatment plan with patient or surrogate, discussions with consultants, interpretation  of cardiac output measurements, evaluation of patient's response to treatment, ordering and review of laboratory studies and ordering and review of radiographic studies (TRANSFUSION OF 2 UNITS prbc FOR SYMPTOMATIC ANEMIA).   (including critical care time) Labs Review Labs Reviewed  CBC WITH DIFFERENTIAL - Abnormal; Notable for the following:    RBC 2.99 (*)    Hemoglobin 6.2 (*)    HCT 22.1 (*)    MCV 73.9 (*)    MCH 20.7 (*)    MCHC 28.1 (*)    RDW 17.0 (*)    Monocytes Relative 14 (*)    All other components within normal limits  RETICULOCYTES - Abnormal; Notable for the following:    RBC. 2.99 (*)    All other components within normal limits  COMPREHENSIVE METABOLIC PANEL  PRO B NATRIURETIC PEPTIDE  TROPONIN I  COMPREHENSIVE METABOLIC PANEL  CBC WITH DIFFERENTIAL  CBC WITH DIFFERENTIAL  TROPONIN I  TROPONIN I  TROPONIN I  TSH  OCCULT BLOOD X 1 CARD TO LAB, STOOL  TYPE AND SCREEN  PREPARE RBC (CROSSMATCH)    Imaging Review Dg Chest 2 View  02/21/2014   CLINICAL DATA:  Lethargy for 1 month.  EXAM: CHEST  2 VIEW  COMPARISON:  None.  FINDINGS: The heart size and mediastinal contours are within normal limits. Asymmetric elevation of  the left hemidiaphragm noted. Both lungs are clear. Chronic left posterior lateral rib fractures noted.  IMPRESSION: No acute cardiopulmonary disease.   Electronically Signed   By: Kerby Moors M.D.   On: 02/21/2014 21:20     EKG Interpretation None      MDM   Final diagnoses:  Anemia, unspecified anemia type    Discussed the case with hospitalist Dr. [name]. Patient be admitted. Blood transfusion consent obtained. Transfusion initiated in the emergency room.    Tanna Furry, MD 02/21/14 1572  Tanna Furry, MD 02/21/14 [phone]

## 2014-02-21 NOTE — Progress Notes (Signed)
   Subjective:    HPI  F/u anemia with Hgb 7.0: we were unable to contact him. F/u weakness in B thighs when walking x 3-4 months; they feel "tired" - no change. No LBP. No leg pain. The patient presents for a follow-up of  chronic hypertension, chronic dyslipidemia, CAD controlled with medicines. Wife is very sick w/CHF  Wt Readings from Last 3 Encounters:  02/21/14 239 lb (108.41 kg)  01/24/14 242 lb (109.77 kg)  11/07/13 238 lb (107.956 kg)   BP Readings from Last 3 Encounters:  02/21/14 130/70  01/24/14 128/70  11/07/13 130/80      Review of Systems  Constitutional: Negative for appetite change, fatigue and unexpected weight change.  HENT: Negative for nosebleeds and trouble swallowing.   Eyes: Negative for itching and visual disturbance.  Respiratory: Positive for chest tightness.   Cardiovascular: Negative for palpitations and leg swelling.  Gastrointestinal: Negative for blood in stool and abdominal distention.  Genitourinary: Negative for frequency and hematuria.  Musculoskeletal: Positive for arthralgias and back pain. Negative for gait problem and joint swelling.  Neurological: Negative for dizziness, tremors, speech difficulty and weakness.  Psychiatric/Behavioral: Negative for suicidal ideas, sleep disturbance, dysphoric mood and agitation. The patient is not nervous/anxious.        Objective:   Physical Exam  Constitutional: He is oriented to person, place, and time. He appears well-developed.  HENT:  Mouth/Throat: Oropharynx is clear and moist. No oropharyngeal exudate.  eryth throat  Eyes: Conjunctivae are normal. Pupils are equal, round, and reactive to light.  Neck: Normal range of motion. No JVD present. No thyromegaly present.  Cardiovascular: Normal rate, regular rhythm, normal heart sounds and intact distal pulses.  Exam reveals no gallop and no friction rub.   No murmur heard. Pulmonary/Chest: Effort normal and breath sounds normal. No respiratory  distress. He has no wheezes. He has no rales. He exhibits no tenderness.  Abdominal: Soft. Bowel sounds are normal. He exhibits no distension and no mass. There is no tenderness. There is no rebound and no guarding.  Genitourinary: Rectum normal and prostate normal. Guaiac negative stool.  Musculoskeletal: Normal range of motion. He exhibits edema (1+) and tenderness (R radial wrist is tender).  Lymphadenopathy:    He has no cervical adenopathy.  Neurological: He is alert and oriented to person, place, and time. He has normal reflexes. No cranial nerve deficit. He exhibits normal muscle tone. Coordination normal.  Skin: Skin is warm and dry. No rash noted.  Psychiatric: He has a normal mood and affect. His behavior is normal. Judgment and thought content normal.  Tanned but ?pale skin  Lab Results  Component Value Date   WBC 4.4 01/24/2014   HGB 7.0 Repeated and verified X2.* 01/24/2014   HCT 23.1 Repeated and verified X2.* 01/24/2014   PLT 236.0 01/24/2014   GLUCOSE 95 01/24/2014   CHOL 162 11/07/2013   TRIG 122.0 11/07/2013   HDL 57.30 11/07/2013   LDLDIRECT 157.5 07/05/2008   LDLCALC 80 11/07/2013   ALT 12 01/24/2014   AST 16 01/24/2014   NA 141 01/24/2014   K 4.6 01/24/2014   CL 108 01/24/2014   CREATININE 0.7 01/24/2014   BUN 17 01/24/2014   CO2 28 01/24/2014   TSH 0.65 01/24/2014   PSA 0.49 03/01/2012        Assessment & Plan:

## 2014-02-21 NOTE — Assessment & Plan Note (Signed)
Start inj/SL

## 2014-02-21 NOTE — Progress Notes (Signed)
Pre visit review using our clinic review tool, if applicable. No additional management support is needed unless otherwise documented below in the visit note. 

## 2014-02-21 NOTE — Assessment & Plan Note (Addendum)
6/15 - new ?etiology GI ref Iron, folic acid  Correct R48 def PO iron To ER if Hgb is down

## 2014-02-21 NOTE — ED Notes (Signed)
MD Jeneen Rinks notified of hemoglobin of 6.2.

## 2014-02-21 NOTE — Assessment & Plan Note (Signed)
6/15 ?etiology: due to anemia and B12 def; r/o malignancy

## 2014-02-21 NOTE — ED Notes (Signed)
Attempted to call report. Floor RN unable to accept report.  

## 2014-02-21 NOTE — H&P (Signed)
Hospitalist Admission History and Physical  Patient name: [name] record number: [id number] Date of birth: 03-31-1941 Age: 73 y.o. Gender: male  Primary Care Neven Fina: [name], MD  Chief Complaint: fatigue, anemia, LE edema  History of Present Illness:This is a 73 y.o. year old male with noted prior hx/o CAD, HTN, LBP presenting with fatigue, anemia, LE swelling. Patient was seen by his PCP for symptoms approximately 2 weeks ago. Was initially started on hydrochlorothiazide with followup blood work. Patient with a hemoglobin of around 7. Clinic tried to contact patient to discuss results. However, patient was not able to be contacted. Patient presented again today at PCP for followup. Note hemoglobin 6.8. Patient was redirected to come to the ER for further evaluation. Patient does report that he is at worsening dyspnea fatigue over the past month. Is usually able to cycle for up to 30-40 loss. Cycle this week and up to 8 miles and felt extremely tired. Denies any chest pain, nausea, diaphoresis. Does take a full dose aspirin daily. Does use NSAIDs intermittently. Denies any melanotic stools or diarrhea. No nausea or vomiting. Swelling has improved somewhat since being on low-dose hydrochlorothiazide. Reports a lot of 4 pound weight loss from last PCP visit to today's visit. Was also Hemoccult negative at PCP visit today. States that he has had normal colonoscopy x 3.   On presentation, hemodynamically stable. Satting greater than 99% on room air. Hemoglobin 6.2. Troponin and BNP  within normal limits. Cr WNL. Pt type and screened. 2 unit pRBC transfusion pending.   Assessment and Plan: JAYMISON LUBER is a 73 y.o. year old male presenting with fatigue, anemia, LE edema  Fatigue/Anemia: Likely symptomatic anemia. Anemia panel. pRBC transfusion pending. Serial CBCs. Hemoccult pending. Also check TSH. Cycle CEs. ? Am GI consult.   LE edema: no clinical signs of volume  overload currently. CXR w/o cardiomegaly. ProBNP WNL. Suspect this is secondary to anemia. Check TSH and 2D ECHO. Continue to follow. Continue low dose lasix (suspect volume expansion with transfusion)  CAD: Asymptomatic currently. Trop and EKG WNL thus far. Cycle CEs. Hold ASA given above.    FEN/GI: heart healthy diet  Prophylaxis: SCDs Disposition: pending further evaluation  Code Status:Full Code   Patient Active Problem List   Diagnosis Date Noted  . Microcytic anemia 02/21/2014  . B12 deficiency 02/21/2014  . Anemia 02/21/2014  . Leg weakness, bilateral 01/24/2014  . Erectile dysfunction 01/24/2014  . Edema 01/24/2014  . URI, acute 08/31/2013  . Cellulitis of arm, right 07/13/2013  . Grief 07/13/2013  . Well adult exam 03/01/2012  . CARPAL TUNNEL SYNDROME 11/04/2010  . BENIGN PROSTATIC HYPERTROPHY, WITH URINARY OBSTRUCTION 11/04/2010  . WRIST PAIN 11/04/2010  . PARESTHESIA 11/04/2010  . LEG PAIN 07/16/2010  . COUGH 05/20/2010  . TOBACCO USE, QUIT 07/04/2009  . WARTS, VIRAL, UNSPECIFIED 11/06/2008  . INSOMNIA, PERSISTENT 01/14/2008  . HYPERTENSION 01/14/2008  . RHINITIS 01/14/2008  . ABDOMINAL PAIN 01/14/2008  . TREMOR 09/07/2007  . Actinic keratosis 08/12/2007  . Neoplasm of Uncertain Behavior of Skin 07/06/2007  . LOW BACK PAIN 07/06/2007  . HYPERLIPIDEMIA 06/05/2007  . CORONARY ARTERY DISEASE 06/05/2007  . OSTEOARTHRITIS 06/05/2007   Past Medical History: Past Medical History  Diagnosis Date  . Hyperlipidemia   . Osteoarthritis   . LBP (low back pain)   . Insomnia   . CAD (coronary artery disease) 2000    Dr Gay Filler test negative  . OSA (obstructive sleep apnea)  lost 40 lb-does not snore-wife says he is not apnic now  . BPH (benign prostatic hyperplasia)   . CTS (carpal tunnel syndrome)     better  . HTN (hypertension)     no meds    Past Surgical History: Past Surgical History  Procedure Laterality Date  . Total knee arthroplasty   2003    Right  . Lumbar fusion  2007    [place], Dr Vertell Limber  . Carpal tunnel release      Dr Lonia Farber  . Tonsillectomy    . Eye surgery      both cataracts  . Colonoscopy      Social History: History   Social History  . Marital Status: Widowed    Spouse Name: N/A    Number of Children: N/A  . Years of Education: N/A   Occupational History  . Retired    Social History Main Topics  . Smoking status: Former Smoker    Quit date: 01/08/1987  . Smokeless tobacco: None  . Alcohol Use: No     Comment: occasional  . Drug Use: No  . Sexual Activity: Not Currently   Other Topics Concern  . None   Social History Narrative   Regular Exercise -  YES    Family History: Family History  Problem Relation Age of Onset  . Aneurysm Father     AAA  . Cancer Brother   . Mental illness Brother     dementia  . Coronary artery disease Other   . Breast cancer Other   . Cancer Mother 40    breast cancer  . Heart disease Mother     MI    Allergies: No Known Allergies  Current Facility-Administered Medications  Medication Dose Route Frequency Annissa Andreoni Last Rate Last Dose  . 0.9 %  sodium chloride infusion   Intravenous Continuous Shanda Howells, MD      . Cholecalciferol 1,000 Units  1,000 Units Oral Daily Shanda Howells, MD      . DULoxetine (CYMBALTA) DR capsule 60 mg  60 mg Oral Daily Shanda Howells, MD      . finasteride (PROSCAR) tablet 5 mg  5 mg Oral QHS Shanda Howells, MD      . folic acid (FOLVITE) tablet 400 mcg  400 mcg Oral Daily Shanda Howells, MD      . furosemide (LASIX) tablet 20-40 mg  20-40 mg Oral Daily PRN Shanda Howells, MD      . iron polysaccharides (NIFEREX) capsule 150 mg  150 mg Oral Daily Shanda Howells, MD      . simvastatin (ZOCOR) tablet 40 mg  40 mg Oral QHS Shanda Howells, MD      . sodium chloride 0.9 % injection 3 mL  3 mL Intravenous Q12H Shanda Howells, MD      . Derrill Memo ON 02/22/2014] tamsulosin (FLOMAX) capsule 0.4 mg  0.4 mg Oral QPC breakfast Shanda Howells, MD      . Vitamin B-12 SUBL 1,000 mcg  1 tablet Sublingual Daily Shanda Howells, MD      . zolpidem (AMBIEN) tablet 5-10 mg  5-10 mg Oral QHS PRN Shanda Howells, MD       Current Outpatient Prescriptions  Medication Sig Dispense Refill  . aspirin 325 MG tablet Take 325 mg by mouth at bedtime.       . Avanafil (STENDRA) 100 MG TABS Take 100 mg by mouth daily as needed.  12 tablet  5  . Cholecalciferol 1000 UNITS tablet Take  1,000 Units by mouth daily.        . Cyanocobalamin (VITAMIN B-12) 1000 MCG SUBL Place 1 tablet (1,000 mcg total) under the tongue daily.  100 tablet  3  . DULoxetine (CYMBALTA) 60 MG capsule Take 1 capsule (60 mg total) by mouth daily.  30 capsule  5  . finasteride (PROSCAR) 5 MG tablet Take 5 mg by mouth at bedtime.      . fluticasone (FLONASE) 50 MCG/ACT nasal spray Place 2 sprays into both nostrils daily.  16 g  11  . folic acid (V-R FOLIC ACID) 144 MCG tablet Take 1 tablet (400 mcg total) by mouth daily.  30 tablet  5  . furosemide (LASIX) 20 MG tablet Take 1-2 tablets (20-40 mg total) by mouth daily as needed for edema.  60 tablet  5  . iron polysaccharides (NIFEREX) 150 MG capsule Take 1 capsule (150 mg total) by mouth daily.  30 capsule  5  . sildenafil (VIAGRA) 100 MG tablet Take 1 tablet (100 mg total) by mouth as needed for erectile dysfunction.  6 tablet  3  . simvastatin (ZOCOR) 40 MG tablet Take 40 mg by mouth at bedtime.      . tamsulosin (FLOMAX) 0.4 MG CAPS capsule TAKE 1 CAPSULE (0.4 MG TOTAL) BY MOUTH DAILY.  90 capsule  1  . zolpidem (AMBIEN) 10 MG tablet Take 0.5-1 tablets (5-10 mg total) by mouth at bedtime as needed.  30 tablet  3   Review Of Systems: 12 point ROS negative except as noted above in HPI.  Physical Exam: Filed Vitals:   02/21/14 2000  BP: 132/84  Pulse: 62  Temp:   Resp: 11    General: alert and cooperative HEENT: PERRLA and extra ocular movement intact Heart: S1, S2 normal, no murmur, rub or gallop, regular rate and  rhythm Lungs: clear to auscultation, no wheezes or rales and unlabored breathing Abdomen: abdomen is soft without significant tenderness, masses, organomegaly or guarding Extremities: 2+ peripheral pulses, 1-2 + edema bilaterally  Skin:no rashes, no ecchymoses Neurology: normal without focal findings  Labs and Imaging: Lab Results  Component Value Date/Time   NA 143 02/21/2014  8:55 PM   K 4.0 02/21/2014  8:55 PM   CL 106 02/21/2014  8:55 PM   CO2 26 02/21/2014  8:55 PM   BUN 15 02/21/2014  8:55 PM   CREATININE 0.68 02/21/2014  8:55 PM   GLUCOSE 88 02/21/2014  8:55 PM   Lab Results  Component Value Date   WBC 5.0 02/21/2014   HGB 6.2* 02/21/2014   HCT 22.1* 02/21/2014   MCV 73.9* 02/21/2014   PLT 237 02/21/2014    Dg Chest 2 View  02/21/2014   CLINICAL DATA:  Lethargy for 1 month.  EXAM: CHEST  2 VIEW  COMPARISON:  None.  FINDINGS: The heart size and mediastinal contours are within normal limits. Asymmetric elevation of the left hemidiaphragm noted. Both lungs are clear. Chronic left posterior lateral rib fractures noted.  IMPRESSION: No acute cardiopulmonary disease.   Electronically Signed   By: Kerby Moors M.D.   On: 02/21/2014 21:20           Shanda Howells MD  Pager: [phone]

## 2014-02-21 NOTE — ED Notes (Signed)
Pt reports that he was seen by his PCP for bilateral leg swelling 2 weeks ago and was started on "fluid pills." He then had a follow up appointment today and was informed that his "blood levels" had decreased and was told that he need to come to the hospital for a transfusion. Pt denies CP. Pt reports SOB and increased weakness x 3 to 4 weeks. Pt denies blood in stool. Pt reports that his PCP checked for blood in his stool and it was negative. NAD at this time.

## 2014-02-22 ENCOUNTER — Encounter (HOSPITAL_COMMUNITY): Payer: Self-pay | Admitting: Physician Assistant

## 2014-02-22 DIAGNOSIS — R195 Other fecal abnormalities: Secondary | ICD-10-CM | POA: Diagnosis present

## 2014-02-22 DIAGNOSIS — N400 Enlarged prostate without lower urinary tract symptoms: Secondary | ICD-10-CM | POA: Diagnosis present

## 2014-02-22 DIAGNOSIS — D509 Iron deficiency anemia, unspecified: Secondary | ICD-10-CM

## 2014-02-22 DIAGNOSIS — R609 Edema, unspecified: Secondary | ICD-10-CM

## 2014-02-22 DIAGNOSIS — I2510 Atherosclerotic heart disease of native coronary artery without angina pectoris: Secondary | ICD-10-CM

## 2014-02-22 LAB — TROPONIN I
Troponin I: <0.3 ng/mL (ref <0.30)
Troponin I: <0.3 ng/mL (ref <0.30)

## 2014-02-22 LAB — CBC WITH DIFFERENTIAL/PLATELET
Basophils Absolute: 0 10*3/uL (ref 0.0–0.1)
Basophils Relative: 1 % (ref 0–1)
Eosinophils Absolute: 0.2 10*3/uL (ref 0.0–0.7)
Eosinophils Relative: 5 % (ref 0–5)
HCT: 26 % — ABNORMAL LOW (ref 39.0–52.0)
Hemoglobin: 7.6 g/dL — ABNORMAL LOW (ref 13.0–17.0)
Lymphocytes Relative: 21 % (ref 12–46)
Lymphs Abs: 0.8 10*3/uL (ref 0.7–4.0)
MCH: 22.3 pg — ABNORMAL LOW (ref 26.0–34.0)
MCHC: 29.2 g/dL — ABNORMAL LOW (ref 30.0–36.0)
MCV: 76.2 fL — ABNORMAL LOW (ref 78.0–100.0)
Monocytes Absolute: 0.6 10*3/uL (ref 0.1–1.0)
Monocytes Relative: 15 % — ABNORMAL HIGH (ref 3–12)
Neutro Abs: 2.3 10*3/uL (ref 1.7–7.7)
Neutrophils Relative %: 58 % (ref 43–77)
Platelets: 212 10*3/uL (ref 150–400)
RBC: 3.41 MIL/uL — ABNORMAL LOW (ref 4.22–5.81)
RDW: 17.5 % — ABNORMAL HIGH (ref 11.5–15.5)
WBC: 3.9 10*3/uL — ABNORMAL LOW (ref 4.0–10.5)

## 2014-02-22 LAB — COMPREHENSIVE METABOLIC PANEL
ALT: 8 U/L (ref 0–53)
AST: 16 U/L (ref 0–37)
Albumin: 3.5 g/dL (ref 3.5–5.2)
Alkaline Phosphatase: 58 U/L (ref 39–117)
BUN: 13 mg/dL (ref 6–23)
CO2: 24 mEq/L (ref 19–32)
Calcium: 8.8 mg/dL (ref 8.4–10.5)
Chloride: 105 mEq/L (ref 96–112)
Creatinine, Ser: 0.66 mg/dL (ref 0.50–1.35)
GFR calc Af Amer: >90 mL/min (ref >90)
GFR calc non Af Amer: >90 mL/min (ref >90)
Glucose, Bld: 117 mg/dL — ABNORMAL HIGH (ref 70–99)
Potassium: 4 mEq/L (ref 3.7–5.3)
Sodium: 142 mEq/L (ref 137–147)
Total Bilirubin: 0.6 mg/dL (ref 0.3–1.2)
Total Protein: 6.3 g/dL (ref 6.0–8.3)

## 2014-02-22 LAB — PROTIME-INR
INR: 1.18 (ref 0.00–1.49)
Prothrombin Time: 15 seconds (ref 11.6–15.2)

## 2014-02-22 LAB — APTT: aPTT: 39 seconds — ABNORMAL HIGH (ref 24–37)

## 2014-02-22 MED ORDER — PANTOPRAZOLE SODIUM 40 MG PO TBEC
40.0000 mg | DELAYED_RELEASE_TABLET | Freq: Two times a day (BID) | ORAL | Status: DC
  Administered 2014-02-22 – 2014-02-24 (×5): 40 mg via ORAL
  Filled 2014-02-22 (×5): qty 1

## 2014-02-22 MED ORDER — PNEUMOCOCCAL VAC POLYVALENT 25 MCG/0.5ML IJ INJ
0.5000 mL | INJECTION | INTRAMUSCULAR | Status: DC
  Filled 2014-02-22: qty 0.5

## 2014-02-22 MED ORDER — SODIUM CHLORIDE 0.9 % IV SOLN
INTRAVENOUS | Status: DC
  Administered 2014-02-22: 17:00:00 via INTRAVENOUS

## 2014-02-22 NOTE — Progress Notes (Signed)
[facility] PROGRESS NOTE  BURHANUDDIN KOHLMANN AGT:[id number] DOB: 1940-11-05 DOA: 02/21/2014 PCP: [name], MD  Assessment/Plan:  Principal Problem:   Anemia, iron deficiency:  with heme positive stool.  Takes daily iron. Occasional aleve. Never had EGD. Had colonoscopy 2 years ago with Dr. Sharlett Iles.  Will start bid protonix.  Consult gi for EGD. S/p 2 units prbc. hgb 7.6 this am. Will repeat and check coags.  NPO and hold meds. Already ate breakfast.    HYPERLIPIDEMIA    INSOMNIA, PERSISTENT    CORONARY ARTERY DISEASE: stable. D/c telde    Edema: TSH, echo pending    Heme positive stool    BPH (benign prostatic hypertrophy)   Code Status:  full Family Communication:  friend Disposition Plan:  home  Consultants:  [facility]  Procedures:     Antibiotics:    HPI/Subjective: Feels much better. Friend reports appetite poor, but lost his wife last fall.  Sometimes stool seems dark. No melena here.  Objective: Filed Vitals:   02/22/14 0500  BP: 122/64  Pulse: 62  Temp: 97.6 F (36.4 C)  Resp:     Intake/Output Summary (Last 24 hours) at 02/22/14 1014 Last data filed at 02/22/14 0530  Gross per 24 hour  Intake    652 ml  Output      0 ml  Net    652 ml   Filed Weights   02/21/14 2331  Weight: 108.863 kg (240 lb)    Exam:   General:  In chair. Alert, oriented  Cardiovascular: RRR without MGR  Respiratory: CTA without WRR  Abdomen: S, NT, ND  Ext: trace edema  Basic Metabolic Panel:  Recent Labs Lab 02/21/14 2055 02/22/14 0835  NA 143 142  K 4.0 4.0  CL 106 105  CO2 26 24  GLUCOSE 88 117*  BUN 15 13  CREATININE 0.68 0.66  CALCIUM 9.1 8.8   Liver Function Tests:  Recent Labs Lab 02/21/14 2055 02/22/14 0835  AST 18 16  ALT 9 8  ALKPHOS 60 58  BILITOT 0.3 0.6  PROT 6.5 6.3  ALBUMIN 3.7 3.5   No results found for this basename: LIPASE, AMYLASE,  in the last 168 hours No results found for this basename: AMMONIA,  in  the last 168 hours CBC:  Recent Labs Lab 02/21/14 0837 02/21/14 2055 02/21/14 2210 02/22/14 0840  WBC 4.4 5.0 5.1 3.9*  NEUTROABS 2.2 2.3 2.3 2.3  HGB 6.8 Repeated and verified X2.* 6.2* 6.0* 7.6*  HCT 22.1 Repeated and verified X2.* 22.1* 21.7* 26.0*  MCV 70.0* 73.9* 73.6* 76.2*  PLT 262.0 237 221 212   Cardiac Enzymes:  Recent Labs Lab 02/21/14 2055 02/21/14 2210 02/22/14 0840  TROPONINI <0.30 <0.30 <0.30   BNP (last 3 results)  Recent Labs  02/21/14 2055  PROBNP 40.5   CBG: No results found for this basename: GLUCAP,  in the last 168 hours  No results found for this or any previous visit (from the past 240 hour(s)).   Studies: Dg Chest 2 View  02/21/2014   CLINICAL DATA:  Lethargy for 1 month.  EXAM: CHEST  2 VIEW  COMPARISON:  None.  FINDINGS: The heart size and mediastinal contours are within normal limits. Asymmetric elevation of the left hemidiaphragm noted. Both lungs are clear. Chronic left posterior lateral rib fractures noted.  IMPRESSION: No acute cardiopulmonary disease.   Electronically Signed   By: Kerby Moors M.D.   On: 02/21/2014 21:20    Scheduled Meds: .  DULoxetine  60 mg Oral Daily  . finasteride  5 mg Oral QHS  . pantoprazole  40 mg Oral BID  . [START ON 02/23/2014] pneumococcal 23 valent vaccine  0.5 mL Intramuscular Tomorrow-1000  . simvastatin  40 mg Oral QHS  . sodium chloride  3 mL Intravenous Q12H  . tamsulosin  0.4 mg Oral QPC breakfast   Continuous Infusions:   Time spent: 35 minutes  [facility] Pager [phone]. If 7PM-7AM, please contact night-coverage at [email or web address], password [facility] 02/22/2014, 10:14 AM  LOS: 1 day

## 2014-02-22 NOTE — Consult Note (Signed)
[name]: 11:12 AM 02/22/2014  LOS: 1 day    Referring Provider:  Dr Conley Canal Primary Care Physician:  [name], MD Primary Gastroenterologist:  Dr. Sharlett Iles (retired)  Reason for Consultation:  Anemia.    HPI: [name] is a 73 y.o. male.  Hx CAD, anemia, HTn, colon diverticulosis.    Hgb of 7 on routine labs 6/2.  MCV low at 77.  Had been 14.7 2 years ago.  PMD unable to reach pt who eventually returned to PMD yesterday,  Hgb then 6.8.  Iron studies with low iron and low B12, got B12 shot in office.  Sent to ED when Hgb returned at 6.8 + progressive DOE in last 4 weeks.  No nausea, no bloody stools, diarrhea. + occasional formed, dark stools.  FOBT negative at PMD office but FOBT + after arrival to hospital last night.  After 2 units red blood, he feels better this AM.  Pt was started on HCTZ for LE swelling 2 weeks ago, and swelling is improved.  Has had colonoscopies but no EGD ever.  No reflux sxs, no dysphagia, no weight loss, no anorexia.  Never previously transfused.   No nose bleeds.  Takes low dose ASA daily and says bleeding is more vigorous after starting this but no excessive bleeding or bruising.  Takes one Aleve about once per month. drinkd glass of wine a few times per year. FHx + for gastric cancer in older brother in his early 36s.   Mom had breast Cancer.      Past Medical History  Diagnosis Date  . Hyperlipidemia   . Osteoarthritis   . LBP (low back pain)   . Insomnia   . CAD (coronary artery disease) 2000    Dr Gay Filler test negative  . OSA (obstructive sleep apnea)     lost 40 lb-does not snore-wife says he is not apnic now  . BPH (benign prostatic hyperplasia)   . CTS (carpal tunnel syndrome)     better    Past Surgical History  Procedure  Laterality Date  . Total knee arthroplasty  2003    Right  . Lumbar fusion  2007    [place], Dr Vertell Limber  . Carpal tunnel release      Dr Lonia Farber  . Tonsillectomy    . Eye surgery      both cataracts  . Colonoscopy      Prior to Admission medications   Medication Sig Start Date End Date Taking? Authorizing Provider  aspirin 81 MG chewable tablet Chew 81 mg by mouth at bedtime.   Yes Historical Provider, MD  Avanafil (STENDRA) 100 MG TABS Take 100 mg by mouth daily as needed (erectile dysfunction).   Yes Historical Provider, MD  Cyanocobalamin (VITAMIN B-12) 1000 MCG SUBL Place 1 tablet (1,000 mcg total) under the tongue daily. 02/21/14  Yes Aleksei [name] V, MD  DULoxetine (CYMBALTA) 60 MG capsule Take 60 mg by mouth at bedtime.   Yes Historical Provider, MD  folic acid (V-R FOLIC ACID) 962 MCG tablet Take 1  tablet (400 mcg total) by mouth daily. 02/21/14  Yes Aleksei [name] V, MD  furosemide (LASIX) 20 MG tablet Take 1-2 tablets (20-40 mg total) by mouth daily as needed for edema. 01/24/14  Yes Aleksei [name] V, MD  iron polysaccharides (NIFEREX) 150 MG capsule Take 1 capsule (150 mg total) by mouth daily. 02/21/14  Yes Aleksei [name] V, MD  naproxen sodium (ANAPROX) 220 MG tablet Take 220 mg by mouth daily as needed (pain).   Yes Historical Provider, MD  simvastatin (ZOCOR) 40 MG tablet Take 40 mg by mouth at bedtime.   Yes Historical Provider, MD  tamsulosin (FLOMAX) 0.4 MG CAPS capsule Take 0.4 mg by mouth daily after supper.   Yes Historical Provider, MD    Scheduled Meds: . DULoxetine  60 mg Oral Daily  . finasteride  5 mg Oral QHS  . pantoprazole  40 mg Oral BID  . [START ON 02/23/2014] pneumococcal 23 valent vaccine  0.5 mL Intramuscular Tomorrow-1000  . simvastatin  40 mg Oral QHS  . sodium chloride  3 mL Intravenous Q12H  . tamsulosin  0.4 mg Oral QPC breakfast   Infusions:   PRN Meds: zolpidem   Allergies as of 02/21/2014  . (No Known Allergies)    Family  History  Problem Relation Age of Onset  . Aneurysm Father     AAA  . Cancer Brother   . Mental illness Brother     dementia  . Coronary artery disease Other   . Breast cancer Other   . Cancer Mother 66    breast cancer  . Heart disease Mother     MI    History   Social History  . Marital Status: Widowed    Spouse Name: N/A    Number of Children: N/A  . Years of Education: N/A   Occupational History  . Retired    Social History Main Topics  . Smoking status: Former Smoker    Quit date: 01/08/1987  . Smokeless tobacco: Not on file  . Alcohol Use: No     Comment: occasional  . Drug Use: No  . Sexual Activity: Not Currently   Other Topics Concern  . Not on file   Social History Narrative   Regular Exercise -  YES    REVIEW OF SYSTEMS: Constitutional:  Per HPI ENT:  No nose bleeds Pulm:  Per HPI.  No cough CV:  No palpitations, no LE edema.  GU:  No hematuria, no frequency GI:  Per HPI Heme:  Per HPI   Transfusions:  Per HPI Neuro:  No headaches, no peripheral tingling or numbness Derm:  No itching, no rash or sores.  Endocrine:  No sweats or chills.  No polyuria or dysuria Immunization:  Not querried.  Travel:  None beyond local counties in last few months.    PHYSICAL EXAM: Vital signs in last 24 hours: Filed Vitals:   02/22/14 0500  BP: 122/64  Pulse: 62  Temp: 97.6 F (36.4 C)  Resp:    Wt Readings from Last 3 Encounters:  02/21/14 108.863 kg (240 lb)  02/21/14 108.41 kg (239 lb)  01/24/14 109.77 kg (242 lb)    General: fit, well appearing older WM in NAD.  Head:  No swelling , no asymmetry  Eyes:  No icterus or pallor Ears:  Slightly HOH  Nose:  No discharge or congestion Mouth:  Clear, moist oral mm.  No sores.  Partial plate above, good dental repair Neck:  No mass, no  JVD Lungs:  Clear.  No cough or dyspnea Heart: RRR.  No MRG Abdomen:  Soft, no mass.  No HSM.  Active BS.  Not distended.   Rectal: no mass, no stool or blood in  empty vault   Musc/Skeltl: no joint swelling or redness Extremities:  No CCE  Neurologic:  Oriented x3.  Good historian.  No tremor, no limb weakness Skin:  No sores or rash Tattoos:  none Nodes:  No cervical adenopathy.    Psych:  Relaxed, fluid speech.   Intake/Output from previous day: 06/30 0701 - 07/01 0700 In: 652 [I.V.:3; Blood:649] Out: -  Intake/Output this shift:    LAB RESULTS:  Recent Labs  02/21/14 2055 02/21/14 2210 02/22/14 0840  WBC 5.0 5.1 3.9*  HGB 6.2* 6.0* 7.6*  HCT 22.1* 21.7* 26.0*  PLT 237 221 212  MCV    73  BMET Lab Results  Component Value Date   NA 142 02/22/2014   NA 143 02/21/2014   NA 141 01/24/2014   K 4.0 02/22/2014   K 4.0 02/21/2014   K 4.6 01/24/2014   CL 105 02/22/2014   CL 106 02/21/2014   CL 108 01/24/2014   CO2 24 02/22/2014   CO2 26 02/21/2014   CO2 28 01/24/2014   GLUCOSE 117* 02/22/2014   GLUCOSE 88 02/21/2014   GLUCOSE 95 01/24/2014   BUN 13 02/22/2014   BUN 15 02/21/2014   BUN 17 01/24/2014   CREATININE 0.66 02/22/2014   CREATININE 0.68 02/21/2014   CREATININE 0.7 01/24/2014   CALCIUM 8.8 02/22/2014   CALCIUM 9.1 02/21/2014   CALCIUM 9.3 01/24/2014   LFT  Recent Labs  02/21/14 2055 02/22/14 0835  PROT 6.5 6.3  ALBUMIN 3.7 3.5  AST 18 16  ALT 9 8  ALKPHOS 60 58  BILITOT 0.3 0.6   PT/INR No results found for this basename: INR, PROTIME   RADIOLOGY STUDIES: Dg Chest 2 View  02/21/2014   CLINICAL DATA:  Lethargy for 1 month.  EXAM: CHEST  2 VIEW  COMPARISON:  None.  FINDINGS: The heart size and mediastinal contours are within normal limits. Asymmetric elevation of the left hemidiaphragm noted. Both lungs are clear. Chronic left posterior lateral rib fractures noted.  IMPRESSION: No acute cardiopulmonary disease.   Electronically Signed   By: Kerby Moors M.D.   On: 02/21/2014 21:20    ENDOSCOPIC STUDIES: 08/2010  Colonoscopy Average-risk screening colonoscopy ENDOSCOPIC IMPRESSION:  1) Diverticula, scattered  2) No polyps or cancers    3) Diverticula, scattered in the sigmoid to descending colon  segments  RECOMMENDATIONS:  1) high fiber diet  2) Continue current colorectal screening recommendations for  "routine risk" patients with a repeat colonoscopy in 10 years.  REPEAT EXAM: No  2005  Colonoscopy Diverticulosis.     IMPRESSION:   *  Anemia, microcytic, symptomatic. 1 of 2 stool FOBT tests positive No previous hx anemia. Fells better post 2 units PRBCs.   Labs c/w iron and B12 deficiency  *  diverticulosis on colonoscopies in 2005 and 2012.     PLAN:     *  EGD. 1300 tomorrow, let him eat today.   Keep on po Protonix.    Azucena Freed  02/22/2014, 11:12 AM Pager: [phone]  GI ATTENDING  History, laboratories, x-rays, prior endoscopy reports reviewed. Patient seen and examined. Agree with H&P as above. Patient presents with symptomatic iron deficiency anemia. Status post transfusions. He does report dark stools occasionally. No gross bleeding. Previous  colonoscopies as outlined. Plan is for diagnostic upper endoscopy tomorrow. He will need iron replacement therapy. Pending the outcome of EGD, further plans to be determined. Thank you  Docia Chuck. Geri Seminole., M.D. [facility] Division of Gastroenterology

## 2014-02-22 NOTE — Progress Notes (Signed)
UR Completed.  [name] [phone] 02/22/2014  

## 2014-02-23 ENCOUNTER — Telehealth: Payer: Self-pay

## 2014-02-23 ENCOUNTER — Encounter (HOSPITAL_COMMUNITY): Payer: Self-pay | Admitting: Gastroenterology

## 2014-02-23 ENCOUNTER — Encounter (HOSPITAL_COMMUNITY)
Admission: EM | Disposition: A | Discharge status: Home / Self Care | Payer: Medicare Other | Source: Home / Self Care | Attending: Emergency Medicine

## 2014-02-23 DIAGNOSIS — Z5189 Encounter for other specified aftercare: Secondary | ICD-10-CM

## 2014-02-23 HISTORY — PX: ESOPHAGOGASTRODUODENOSCOPY: SHX5428

## 2014-02-23 HISTORY — DX: Encounter for other specified aftercare: Z51.89

## 2014-02-23 LAB — COMPREHENSIVE METABOLIC PANEL
ALT: 7 U/L (ref 0–53)
AST: 12 U/L (ref 0–37)
Albumin: 3.4 g/dL — ABNORMAL LOW (ref 3.5–5.2)
Alkaline Phosphatase: 56 U/L (ref 39–117)
Anion gap: 10 (ref 5–15)
BUN: 11 mg/dL (ref 6–23)
CO2: 27 mEq/L (ref 19–32)
Calcium: 9.1 mg/dL (ref 8.4–10.5)
Chloride: 105 mEq/L (ref 96–112)
Creatinine, Ser: 0.77 mg/dL (ref 0.50–1.35)
GFR calc Af Amer: >90 mL/min (ref >90)
GFR calc non Af Amer: 89 mL/min — ABNORMAL LOW (ref >90)
Glucose, Bld: 98 mg/dL (ref 70–99)
Potassium: 4.6 mEq/L (ref 3.7–5.3)
Sodium: 142 mEq/L (ref 137–147)
Total Bilirubin: 0.6 mg/dL (ref 0.3–1.2)
Total Protein: 6.1 g/dL (ref 6.0–8.3)

## 2014-02-23 LAB — CBC WITH DIFFERENTIAL/PLATELET
Basophils Absolute: 0 10*3/uL (ref 0.0–0.1)
Basophils Relative: 1 % (ref 0–1)
Eosinophils Absolute: 0.2 10*3/uL (ref 0.0–0.7)
Eosinophils Relative: 4 % (ref 0–5)
HCT: 25 % — ABNORMAL LOW (ref 39.0–52.0)
Hemoglobin: 7.2 g/dL — ABNORMAL LOW (ref 13.0–17.0)
Lymphocytes Relative: 35 % (ref 12–46)
Lymphs Abs: 1.9 10*3/uL (ref 0.7–4.0)
MCH: 21.7 pg — ABNORMAL LOW (ref 26.0–34.0)
MCHC: 28.8 g/dL — ABNORMAL LOW (ref 30.0–36.0)
MCV: 75.3 fL — ABNORMAL LOW (ref 78.0–100.0)
Monocytes Absolute: 0.8 10*3/uL (ref 0.1–1.0)
Monocytes Relative: 14 % — ABNORMAL HIGH (ref 3–12)
Neutro Abs: 2.5 10*3/uL (ref 1.7–7.7)
Neutrophils Relative %: 46 % (ref 43–77)
Platelets: 212 10*3/uL (ref 150–400)
RBC: 3.32 MIL/uL — ABNORMAL LOW (ref 4.22–5.81)
RDW: 17.4 % — ABNORMAL HIGH (ref 11.5–15.5)
WBC: 5.3 10*3/uL (ref 4.0–10.5)

## 2014-02-23 LAB — PREPARE RBC (CROSSMATCH)

## 2014-02-23 SURGERY — EGD (ESOPHAGOGASTRODUODENOSCOPY)
Anesthesia: Moderate Sedation

## 2014-02-23 MED ORDER — FENTANYL CITRATE 0.05 MG/ML IJ SOLN
INTRAMUSCULAR | Status: DC | PRN
  Administered 2014-02-23 (×3): 25 ug via INTRAVENOUS

## 2014-02-23 MED ORDER — MIDAZOLAM HCL 5 MG/ML IJ SOLN
INTRAMUSCULAR | Status: AC
  Filled 2014-02-23: qty 2

## 2014-02-23 MED ORDER — BUTAMBEN-TETRACAINE-BENZOCAINE 2-2-14 % EX AERO
INHALATION_SPRAY | CUTANEOUS | Status: DC | PRN
  Administered 2014-02-23: 1 via TOPICAL

## 2014-02-23 MED ORDER — MIDAZOLAM HCL 10 MG/2ML IJ SOLN
INTRAMUSCULAR | Status: DC | PRN
  Administered 2014-02-23: 2 mg via INTRAVENOUS
  Administered 2014-02-23 (×2): 1 mg via INTRAVENOUS
  Administered 2014-02-23: 2 mg via INTRAVENOUS

## 2014-02-23 MED ORDER — FENTANYL CITRATE 0.05 MG/ML IJ SOLN
INTRAMUSCULAR | Status: AC
  Filled 2014-02-23: qty 2

## 2014-02-23 NOTE — Progress Notes (Signed)
Daily Rounding Note  02/23/2014, 11:57 AM  LOS: 2 days   SUBJECTIVE:       Black stools last night and this AM.  No nausea, no abd pain.  Just finishing one unit PRBC (3rd so far, 1st for today) as Hgb went from 6 to 7.6 after 2 units yesterday, but dropped to 7.2 this AM.   Not dizzy, feels stronger  OBJECTIVE:         Vital signs in last 24 hours:    Temp:  [97.3 F (36.3 C)-98.1 F (36.7 C)] 97.8 F (36.6 C) (07/02 1138) Pulse Rate:  [53-61] 56 (07/02 1138) Resp:  [18] 18 (07/02 1138) BP: (129-144)/(64-83) 144/70 mmHg (07/02 1138) SpO2:  [96 %-100 %] 99 % (07/02 1138) Weight:  [108.5 kg (239 lb 3.2 oz)] 108.5 kg (239 lb 3.2 oz) (07/02 0500) Last BM Date: 02/23/14 General: looks well, less pale   Heart: RRR Chest: clear, no labored breathing Abdomen: soft, NT, ND.  No mass, no HSM.  Active BS  Extremities: no CCE  Neuro/Psych:  Grossly intact, fully alert and engaged.  No confusion.   Intake/Output from previous day: 07/01 0701 - 07/02 0700 In: 120 [P.O.:120] Out: -   Intake/Output this shift: Total I/O In: 300.5 [I.V.:3; Blood:297.5] Out: -   Lab Results:  Recent Labs  02/21/14 2210 02/22/14 0840 02/23/14 0340  WBC 5.1 3.9* 5.3  HGB 6.0* 7.6* 7.2*  HCT 21.7* 26.0* 25.0*  PLT 221 212 212   BMET  Recent Labs  02/21/14 2055 02/22/14 0835 02/23/14 0340  NA 143 142 142  K 4.0 4.0 4.6  CL 106 105 105  CO2 26 24 27   GLUCOSE 88 117* 98  BUN 15 13 11   CREATININE 0.68 0.66 0.77  CALCIUM 9.1 8.8 9.1   LFT  Recent Labs  02/21/14 2055 02/22/14 0835 02/23/14 0340  PROT 6.5 6.3 6.1  ALBUMIN 3.7 3.5 3.4*  AST 18 16 12   ALT 9 8 7   ALKPHOS 60 58 56  BILITOT 0.3 0.6 0.6   PT/INR  Recent Labs  02/22/14 1405  LABPROT 15.0  INR 1.18   Hepatitis Panel No results found for this basename: HEPBSAG, HCVAB, HEPAIGM, HEPBIGM,  in the last 72 hours  Studies/Results: Dg Chest 2  View  02/21/2014   CLINICAL DATA:  Lethargy for 1 month.  EXAM: CHEST  2 VIEW  COMPARISON:  None.  FINDINGS: The heart size and mediastinal contours are within normal limits. Asymmetric elevation of the left hemidiaphragm noted. Both lungs are clear. Chronic left posterior lateral rib fractures noted.  IMPRESSION: No acute cardiopulmonary disease.   Electronically Signed   By: Kerby Moors M.D.   On: 02/21/2014 21:20   Scheduled Meds: . DULoxetine  60 mg Oral Daily  . finasteride  5 mg Oral QHS  . pantoprazole  40 mg Oral BID  . pneumococcal 23 valent vaccine  0.5 mL Intramuscular Tomorrow-1000  . simvastatin  40 mg Oral QHS  . sodium chloride  3 mL Intravenous Q12H  . tamsulosin  0.4 mg Oral QPC breakfast   Continuous Infusions: . sodium chloride 20 mL/hr at 02/22/14 1707   PRN Meds:.zolpidem  ASSESMENT:   * Anemia, microcytic, symptomatic. 1 of 2 stool FOBT tests positive  No previous hx anemia. S/p 3 PRBCs thus far. BUN remains normal. Infrequent Aleve PTA.  Labs c/w iron and B12 deficiency.  Empiric BID oral Protonix initiated (no PPI  PTA).    * diverticulosis on colonoscopies in 2005 and 2012.     PLAN   *  EGD today at 1300.    [name]  02/23/2014, 11:57 AM Pager: [phone]  Agree.The nature of the procedure, as well as the risks, benefits, and alternatives were carefully and thoroughly reviewed with the patient. Ample time for discussion and questions allowed. The patient understood, was satisfied, and agreed to proceed.  Docia Chuck. Geri Seminole., M.D. [facility] Division of Gastroenterology

## 2014-02-23 NOTE — Progress Notes (Signed)
[facility] PROGRESS NOTE  DALYN BECKER CLE:[id number] DOB: 07/01/1941 DOA: 02/21/2014 PCP: [name], MD  Assessment/Plan:  Principal Problem:   Anemia, iron deficiency with heme positive dark stool.  Hgb lower today and still describing melena.  Will give another unit PRBC.  EGD today. Continue PPI.  Appreciate GI    HYPERLIPIDEMIA    INSOMNIA, PERSISTENT    CORONARY ARTERY DISEASE: stable.     Edema: echo pending. TSH ok    BPH (benign prostatic hypertrophy)   Code Status:  full Family Communication:  Significant other 7/1 Disposition Plan:  home  Consultants:  [facility]  Procedures:     Antibiotics:    HPI/Subjective: Had dark stool just now. Flushed before showing Therapist, sports. Otherwise well.  Objective: Filed Vitals:   02/23/14 0437  BP: 139/83  Pulse: 59  Temp: 97.6 F (36.4 C)  Resp: 18    Intake/Output Summary (Last 24 hours) at 02/23/14 0838 Last data filed at 02/22/14 1805  Gross per 24 hour  Intake    120 ml  Output      0 ml  Net    120 ml   Filed Weights   02/21/14 2331 02/23/14 0437 02/23/14 0500  Weight: 108.863 kg (240 lb) 108.5 kg (239 lb 3.2 oz) 108.5 kg (239 lb 3.2 oz)    Exam:   General:  Alert, oriented  Cardiovascular: RRR without MGR  Respiratory: CTA without WRR  Abdomen: S, NT, ND  Ext: trace edema  Basic Metabolic Panel:  Recent Labs Lab 02/21/14 2055 02/22/14 0835 02/23/14 0340  NA 143 142 142  K 4.0 4.0 4.6  CL 106 105 105  CO2 26 24 27   GLUCOSE 88 117* 98  BUN 15 13 11   CREATININE 0.68 0.66 0.77  CALCIUM 9.1 8.8 9.1   Liver Function Tests:  Recent Labs Lab 02/21/14 2055 02/22/14 0835 02/23/14 0340  AST 18 16 12   ALT 9 8 7   ALKPHOS 60 58 56  BILITOT 0.3 0.6 0.6  PROT 6.5 6.3 6.1  ALBUMIN 3.7 3.5 3.4*   No results found for this basename: LIPASE, AMYLASE,  in the last 168 hours No results found for this basename: AMMONIA,  in the last 168 hours CBC:  Recent Labs Lab  02/21/14 0837 02/21/14 2055 02/21/14 2210 02/22/14 0840 02/23/14 0340  WBC 4.4 5.0 5.1 3.9* 5.3  NEUTROABS 2.2 2.3 2.3 2.3 2.5  HGB 6.8 Repeated and verified X2.* 6.2* 6.0* 7.6* 7.2*  HCT 22.1 Repeated and verified X2.* 22.1* 21.7* 26.0* 25.0*  MCV 70.0* 73.9* 73.6* 76.2* 75.3*  PLT 262.0 237 221 212 212   Cardiac Enzymes:  Recent Labs Lab 02/21/14 2055 02/21/14 2210 02/22/14 0840 02/22/14 0930  TROPONINI <0.30 <0.30 <0.30 <0.30   BNP (last 3 results)  Recent Labs  02/21/14 2055  PROBNP 40.5   CBG: No results found for this basename: GLUCAP,  in the last 168 hours  No results found for this or any previous visit (from the past 240 hour(s)).   Studies: Dg Chest 2 View  02/21/2014   CLINICAL DATA:  Lethargy for 1 month.  EXAM: CHEST  2 VIEW  COMPARISON:  None.  FINDINGS: The heart size and mediastinal contours are within normal limits. Asymmetric elevation of the left hemidiaphragm noted. Both lungs are clear. Chronic left posterior lateral rib fractures noted.  IMPRESSION: No acute cardiopulmonary disease.   Electronically Signed   By: Kerby Moors M.D.   On: 02/21/2014 21:20  Scheduled Meds: . DULoxetine  60 mg Oral Daily  . finasteride  5 mg Oral QHS  . pantoprazole  40 mg Oral BID  . pneumococcal 23 valent vaccine  0.5 mL Intramuscular Tomorrow-1000  . simvastatin  40 mg Oral QHS  . sodium chloride  3 mL Intravenous Q12H  . tamsulosin  0.4 mg Oral QPC breakfast   Continuous Infusions: . sodium chloride 20 mL/hr at 02/22/14 1707    Time spent: 25 minutes  [facility] Pager [phone]. If 7PM-7AM, please contact night-coverage at [email or web address], password [facility] 02/23/2014, 8:38 AM  LOS: 2 days

## 2014-02-23 NOTE — Telephone Encounter (Signed)
Message copied by Algernon Huxley on Thu Feb 23, 2014  4:52 PM ------      Message from: Irene Shipper      Created: Thu Feb 23, 2014  4:42 PM      Regarding: RE: Needs colonoscopy in [facility]       Office appointment Not necessary. Make sure they know. Thanks       ----- Message -----         From: Maury Dus, RN         Sent: 02/23/2014   2:58 PM           To: Irene Shipper, MD      Subject: RE: Needs colonoscopy in [place]                             Dr. Henrene Pastor,            Will he keep his OV with you that is scheduled for Monday 02/27/14? I have scheduled him for previsit 7/9@2pm , Colon 03/06/14@4pm .            ----- Message -----         From: Irene Shipper, MD         Sent: 02/23/2014   2:53 PM           To: Maury Dus, RN      Subject: Needs colonoscopy in [facility],      I just finished an EGD on this patient at [facility]. He should be going home today or tomorrow. He needs outpatient colonoscopy at [facility]. Contact him the first of the week and arrange. No emergency, but the sooner the better. Thanks             ------

## 2014-02-23 NOTE — Progress Notes (Signed)
  Echocardiogram 2D Echocardiogram has been performed.  Basilia Jumbo 02/23/2014, 12:37 PM

## 2014-02-23 NOTE — Telephone Encounter (Signed)
Pts ov for 02/27/14 cancelled. Spoke with pts INPT nurse and she is to notify pt of cancellation and of new previsit and colon appt.

## 2014-02-23 NOTE — Progress Notes (Signed)
UR Completed.  Amran Malter Jane [phone] 02/23/2014  

## 2014-02-23 NOTE — Progress Notes (Signed)
EGD noted. Will monitor overnight r/o continued drop in hgb.   Doree Barthel, MD

## 2014-02-23 NOTE — Op Note (Signed)
[facility] [place], [id number]   ENDOSCOPY PROCEDURE REPORT  PATIENT: Yazir, Koerber  MR#: [id number] BIRTHDATE: 1941-03-19 , 72  yrs. old GENDER: Male ENDOSCOPIST: Eustace Quail, MD REFERRED BY:  .  Hospital lists, [facility] PROCEDURE DATE:  02/23/2014 PROCEDURE:  EGD, diagnostic ASA CLASS:     Class II INDICATIONS:  Iron deficiency anemia.   Heme positive stool. MEDICATIONS: Fentanyl 75 mcg IV and Versed 6 mg IV TOPICAL ANESTHETIC: Cetacaine Spray  DESCRIPTION OF PROCEDURE: After the risks benefits and alternatives of the procedure were thoroughly explained, informed consent was obtained.  The Pentax Gastroscope [id number] endoscope was introduced through the mouth and advanced to the second portion of the duodenum. Without limitations.  The instrument was slowly withdrawn as the mucosa was fully examined.    EXAM:The upper, middle and distal third of the esophagus were carefully inspected and no abnormalities were noted.  The z-line was well seen at the GEJ.  The endoscope was pushed into the fundus which was normal including a retroflexed view.  The antrum, gastric body, first and second part of the duodenum were unremarkable. Retroflexed views revealed no abnormalities.     The scope was then withdrawn from the patient and the procedure completed.  COMPLICATIONS: There were no complications. ENDOSCOPIC IMPRESSION: 1. Normal EGD 2. No cause for iron deficiency anemia and Hemoccult-positive stool identified  RECOMMENDATIONS: 1.  Continue iron therapy daily 2.  My office will schedule a colonoscopy for you as an outpatient at [facility] ([place] [phone]) 3. May need capsule endoscopy depending upon colonoscopy results.  Okay to be discharged from GI standpoint. Will sign off. Discussed with patient and significant other.  REPEAT EXAM:  eSigned:  Eustace Quail, MD 02/23/2014 1:35 PM   CC:The Patient

## 2014-02-24 ENCOUNTER — Encounter (HOSPITAL_COMMUNITY): Payer: Self-pay | Admitting: Internal Medicine

## 2014-02-24 DIAGNOSIS — D649 Anemia, unspecified: Secondary | ICD-10-CM

## 2014-02-24 LAB — HEMOGLOBIN AND HEMATOCRIT, BLOOD
HCT: 29.2 % — ABNORMAL LOW (ref 39.0–52.0)
Hemoglobin: 8.5 g/dL — ABNORMAL LOW (ref 13.0–17.0)

## 2014-02-24 LAB — COMPREHENSIVE METABOLIC PANEL
ALT: 9 U/L (ref 0–53)
AST: 14 U/L (ref 0–37)
Albumin: 3.7 g/dL (ref 3.5–5.2)
Alkaline Phosphatase: 66 U/L (ref 39–117)
Anion gap: 11 (ref 5–15)
BUN: 13 mg/dL (ref 6–23)
CO2: 27 mEq/L (ref 19–32)
Calcium: 9.2 mg/dL (ref 8.4–10.5)
Chloride: 105 mEq/L (ref 96–112)
Creatinine, Ser: 0.73 mg/dL (ref 0.50–1.35)
GFR calc Af Amer: >90 mL/min (ref >90)
GFR calc non Af Amer: >90 mL/min (ref >90)
Glucose, Bld: 89 mg/dL (ref 70–99)
Potassium: 3.9 mEq/L (ref 3.7–5.3)
Sodium: 143 mEq/L (ref 137–147)
Total Bilirubin: 0.5 mg/dL (ref 0.3–1.2)
Total Protein: 6.8 g/dL (ref 6.0–8.3)

## 2014-02-24 LAB — TYPE AND SCREEN
ABO/RH(D): O POS
Antibody Screen: NEGATIVE
Unit division: 0
Unit division: 0
Unit division: 0

## 2014-02-24 NOTE — Discharge Summary (Signed)
Physician Discharge Summary  [name] ZOX:[id number] DOB: 1941-04-16 DOA: 02/21/2014  PCP: [name], MD  Admit date: 02/21/2014 Discharge date: 02/24/2014  Recommendations for Outpatient Follow-up:  1. Pt will need to follow up with PCP in 2-3 weeks post discharge 2. Please obtain BMP to evaluate electrolytes and kidney function 3. Please also check CBC to evaluate Hg and Hct levels 4. Follow up with GI, Dr. Blanch Media office, for colonoscopy  Discharge Diagnoses:  Principal Problem:   Anemia, iron deficiency Active Problems:   HYPERLIPIDEMIA   INSOMNIA, PERSISTENT   CORONARY ARTERY DISEASE   Edema   Heme positive stool   BPH (benign prostatic hypertrophy)  Discharge Condition: Stable  Diet recommendation: Regular diet   History of present illness as in H&P by Dr. [name]:  This is a 73 y.o. year old male with noted prior hx/o CAD, HTN, LBP presenting with fatigue, anemia, LE swelling. Patient was seen by his PCP for symptoms approximately 2 weeks ago. Was initially started on hydrochlorothiazide with followup blood work. Patient with a hemoglobin of around 7. Clinic tried to contact patient to discuss results. However, patient was not able to be contacted. Patient presented again today at PCP for followup. Note hemoglobin 6.8. Patient was redirected to come to the ER for further evaluation. Patient does report that he is at worsening dyspnea fatigue over the past month. Is usually able to cycle for up to 30-40 loss. Cycle this week and up to 8 miles and felt extremely tired. Denies any chest pain, nausea, diaphoresis. Does take a full dose aspirin daily. Does use NSAIDs intermittently. Denies any melanotic stools or diarrhea. No nausea or vomiting. Swelling has improved somewhat since being on low-dose hydrochlorothiazide. Reports a lot of 4 pound weight loss from last PCP visit to today's visit. Was also Hemoccult negative at PCP visit today. States that he has had normal  colonoscopy x 3.  On presentation, hemodynamically stable. Satting greater than 99% on room air. Hemoglobin 6.2. Troponin and BNP within normal limits. Cr WNL. Pt type and screened. 2 unit pRBC transfusion pending.    Hospital Course:  Principal Problem: Heme positive stool and Anemia, iron deficiency: [name] was admitted for anemia and heme positive stool.  He required a blood transfusion, which improved his H/H from 6.0 at nadir to 8.5 at discharged.  He had a GI consultation and an EGD was done which did not show anything to explain his bleeding.  He was scheduled for a colonoscopy as an outpatient with Dr. Henrene Pastor.  His fatigue was improved on discharge.  He was discharged on oral iron and advised to limit/stop his naproxen use.   Active Problems: Edema: TTE revealed grade 2 diastolic dysfunction.   He was continued on his home regimen of lasix and this was improved on discharge.   HYPERLIPIDEMIA: stable, continued on zocor.  CORONARY ARTERY DISEASE: No acute issues while in hospital   BPH (benign prostatic hypertrophy): discharged on home flomax.     Procedures/Studies: Dg Chest 2 View  02/21/2014   CLINICAL DATA:  Lethargy for 1 month.  EXAM: CHEST  2 VIEW  COMPARISON:  None.  FINDINGS: The heart size and mediastinal contours are within normal limits. Asymmetric elevation of the left hemidiaphragm noted. Both lungs are clear. Chronic left posterior lateral rib fractures noted.  IMPRESSION: No acute cardiopulmonary disease.   Electronically Signed   By: Kerby Moors M.D.   On: 02/21/2014 21:20      Consultations:  GI  Antibiotics:  none  Discharge Exam: Filed Vitals:   02/24/14 0424  BP: 106/54  Pulse: 68  Temp: 97.8 F (36.6 C)  Resp: 19   Filed Vitals:   02/23/14 1357 02/23/14 1423 02/23/14 2144 02/24/14 0424  BP: 134/75 141/67 105/65 106/54  Pulse: 62 58 58 68  Temp:   97.9 F (36.6 C) 97.8 F (36.6 C)  TempSrc:   Oral Oral  Resp: 18 18 18 19    Height:      Weight:    240 lb 12.8 oz (109.226 kg)  SpO2: 100% 100% 98% 97%    General: Pt is alert, follows commands appropriately, not in acute distress, sitting on side of bed  Cardiovascular: Regular rate and normal rhythm, no murmur  Respiratory: Clear to auscultation bilaterally, no wheezing  Abdomen: Soft, non tender, non distended, + BS Extremities: 1+ edema bilaterally.  Neuro: Grossly nonfocal   Discharge Instructions     Medication List         aspirin 81 MG chewable tablet  Chew 81 mg by mouth at bedtime.     DULoxetine 60 MG capsule  Commonly known as:  CYMBALTA  Take 60 mg by mouth at bedtime.     folic acid 814 MCG tablet  Commonly known as:  V-R FOLIC ACID  Take 1 tablet (400 mcg total) by mouth daily.     furosemide 20 MG tablet  Commonly known as:  LASIX  Take 1-2 tablets (20-40 mg total) by mouth daily as needed for edema.     iron polysaccharides 150 MG capsule  Commonly known as:  NIFEREX  Take 1 capsule (150 mg total) by mouth daily.     naproxen sodium 220 MG tablet  Commonly known as:  ANAPROX  Take 220 mg by mouth daily as needed (pain).     simvastatin 40 MG tablet  Commonly known as:  ZOCOR  Take 40 mg by mouth at bedtime.     STENDRA 100 MG Tabs  Generic drug:  Avanafil  Take 100 mg by mouth daily as needed (erectile dysfunction).     tamsulosin 0.4 MG Caps capsule  Commonly known as:  FLOMAX  Take 0.4 mg by mouth daily after supper.     Vitamin B-12 1000 MCG Subl  Place 1 tablet (1,000 mcg total) under the tongue daily.           Follow-up Information   Call Scarlette Shorts, MD. (7/6 for previsit, 7/13 for colonoscopy)    Specialty:  Gastroenterology   Contact information:   [address] [place] [address] [phone]       Follow up with [name], MD. Schedule an appointment as soon as possible for a visit in 2 days.   Specialty:  Internal Medicine   Contact information:   [place]  [phone]        The results of significant diagnostics from this hospitalization (including imaging, microbiology, ancillary and laboratory) are listed below for reference.        Labs: Basic Metabolic Panel:  Recent Labs Lab 02/21/14 2055 02/22/14 0835 02/23/14 0340 02/24/14 0452  NA 143 142 142 143  K 4.0 4.0 4.6 3.9  CL 106 105 105 105  CO2 26 24 27 27   GLUCOSE 88 117* 98 89  BUN 15 13 11 13   CREATININE 0.68 0.66 0.77 0.73  CALCIUM 9.1 8.8 9.1 9.2   Liver Function Tests:  Recent Labs Lab  02/21/14 2055 02/22/14 0835 02/23/14 0340 02/24/14 0452  AST 18 16 12 14   ALT 9 8 7 9   ALKPHOS 60 58 56 66  BILITOT 0.3 0.6 0.6 0.5  PROT 6.5 6.3 6.1 6.8  ALBUMIN 3.7 3.5 3.4* 3.7   CBC:  Recent Labs Lab 02/21/14 0837 02/21/14 2055 02/21/14 2210 02/22/14 0840 02/23/14 0340 02/24/14 0452  WBC 4.4 5.0 5.1 3.9* 5.3  --   NEUTROABS 2.2 2.3 2.3 2.3 2.5  --   HGB 6.8 Repeated and verified X2.* 6.2* 6.0* 7.6* 7.2* 8.5*  HCT 22.1 Repeated and verified X2.* 22.1* 21.7* 26.0* 25.0* 29.2*  MCV 70.0* 73.9* 73.6* 76.2* 75.3*  --   PLT 262.0 237 221 212 212  --    Cardiac Enzymes:  Recent Labs Lab 02/21/14 2055 02/21/14 2210 02/22/14 0840 02/22/14 0930  TROPONINI <0.30 <0.30 <0.30 <0.30   BNP: BNP (last 3 results)  Recent Labs  02/21/14 2055  PROBNP 40.5     SIGNED: Time coordinating discharge: Over 30 minutes  Gilles Chiquito, MD  [facility] 02/24/2014, 10:21 AM Pager [phone]  If 7PM-7AM, please contact night-coverage [email or web address] Password [email or web address]

## 2014-02-24 NOTE — Progress Notes (Signed)
[facility] PROGRESS NOTE  THEODUS RAN IRS:[id number] DOB: [birth year] DOA: 02/21/2014 PCP: [name], MD  Brief narrative: [name] is a 73yo man who came in with anemia, iron deficiency and heme positive stool.  He had an EGD which did not show a source.  Is on a PPI and GI is following.   Principal Problem: Anemia, iron deficiency, Heme positive stool H/H improved today, can be discharged home with good follow up Continue Iron and PPI Advised to discontinue NSAID, which he stated he did not take often.    Active Problems: HYPERLIPIDEMIA Continue statin  CORONARY ARTERY DISEASE Stable, no chest pain or symptoms today Continue home meds of iron, lasix and statin  Edema On lasix at home, continue    BPH (benign prostatic hypertrophy) On tamsulosin at home, can continue  Consultants:  GI  Procedures/Studies:  EGD  Antibiotics:  None  Code Status: Full Family Communication: Pt and wife at bedside Disposition Plan: Home today  HPI/Subjective: No events overnight, patient ready to go home, has GI follow up arranged.   Objective: Filed Vitals:   02/23/14 1357 02/23/14 1423 02/23/14 2144 02/24/14 0424  BP: 134/75 141/67 105/65 106/54  Pulse: 62 58 58 68  Temp:   97.9 F (36.6 C) 97.8 F (36.6 C)  TempSrc:   Oral Oral  Resp: 18 18 18 19   Height:      Weight:    240 lb 12.8 oz (109.226 kg)  SpO2: 100% 100% 98% 97%    Intake/Output Summary (Last 24 hours) at 02/24/14 1431 Last data filed at 02/24/14 0900  Gross per 24 hour  Intake    840 ml  Output      0 ml  Net    840 ml    Exam:   General:  Pt is alert, follows commands appropriately, not in acute distress, sitting on side of bed  Cardiovascular: Regular rate and normal rhythm, no murmur  Respiratory: Clear to auscultation bilaterally, no wheezing  Abdomen: Soft, non tender, non distended, + BS  Extremities: 1+ edema bilaterally.   Neuro: Grossly nonfocal  Data  Reviewed: Basic Metabolic Panel:  Recent Labs Lab 02/21/14 2055 02/22/14 0835 02/23/14 0340 02/24/14 0452  NA 143 142 142 143  K 4.0 4.0 4.6 3.9  CL 106 105 105 105  CO2 26 24 27 27   GLUCOSE 88 117* 98 89  BUN 15 13 11 13   CREATININE 0.68 0.66 0.77 0.73  CALCIUM 9.1 8.8 9.1 9.2   Liver Function Tests:  Recent Labs Lab 02/21/14 2055 02/22/14 0835 02/23/14 0340 02/24/14 0452  AST 18 16 12 14   ALT 9 8 7 9   ALKPHOS 60 58 56 66  BILITOT 0.3 0.6 0.6 0.5  PROT 6.5 6.3 6.1 6.8  ALBUMIN 3.7 3.5 3.4* 3.7   CBC:  Recent Labs Lab 02/21/14 0837 02/21/14 2055 02/21/14 2210 02/22/14 0840 02/23/14 0340 02/24/14 0452  WBC 4.4 5.0 5.1 3.9* 5.3  --   NEUTROABS 2.2 2.3 2.3 2.3 2.5  --   HGB 6.8 Repeated and verified X2.* 6.2* 6.0* 7.6* 7.2* 8.5*  HCT 22.1 Repeated and verified X2.* 22.1* 21.7* 26.0* 25.0* 29.2*  MCV 70.0* 73.9* 73.6* 76.2* 75.3*  --   PLT 262.0 237 221 212 212  --    Cardiac Enzymes:  Recent Labs Lab 02/21/14 2055 02/21/14 2210 02/22/14 0840 02/22/14 0930  TROPONINI <0.30 <0.30 <0.30 <0.30       Gilles Chiquito, MD  [facility] Pager [phone]  If 7PM-7AM, please contact night-coverage [email or web address] Password [email or web address] 02/24/2014, 2:31 PM   LOS: 3 days

## 2014-02-24 NOTE — Progress Notes (Signed)
UR Completed.  [name] Jane [phone] 02/24/2014  

## 2014-02-24 NOTE — Discharge Instructions (Addendum)
[name], you were admitted for bleeding in your GI tract.  You had blood in your stool and required blood products to improve your anemia.  You had an upper endoscopy which did not show a reason for the bleeding in your stomach or first part of your intestines.  You have follow up appointments with the GI doctors to have a colonoscopy to look for the source of the bleeding.  Please keep taking your iron pills.   Please call your PCP or come back to the hospital if you develop worsening bleeding, dizziness, shortness of breath, chest pain, fever, chills, uncontrollable nausea or vomiting, diarrhea or severe constipation.    Thank you.   Gastrointestinal Bleeding Gastrointestinal bleeding is bleeding somewhere along the path that food travels through the body (digestive tract). This path is anywhere between the mouth and the opening of the butt (anus). You may have blood in your throw up (vomit) or in your poop (stools). If there is a lot of bleeding, you may need to stay in the hospital.  [place]  Only take medicine as told by your doctor.  Eat foods with fiber such as whole grains, fruits, and vegetables. You can also try eating 1 to 3 prunes a day.  Drink enough fluids to keep your pee (urine) clear or pale yellow. GET HELP RIGHT AWAY IF:   Your bleeding gets worse.  You feel dizzy, weak, or you pass out (faint).  You have bad cramps in your back or belly (abdomen).  You have large blood clumps (clots) in your poop.  Your problems are getting worse. MAKE SURE YOU:   Understand these instructions.  Will watch your condition.  Will get help right away if you are not doing well or get worse. Document Released: 05/20/2008 Document Revised: 07/28/2012 Document Reviewed: 07/21/2011 [facility] Patient Information 2015 [place], [place]. This information is not intended to replace advice given to you by your health care provider. Make sure you discuss any questions you have with your  health care provider.

## 2014-02-24 NOTE — Care Management Note (Signed)
    Page 1 of 1   02/24/2014     10:15:07 AM CARE MANAGEMENT NOTE 02/24/2014  Patient:  [name], [name]   Account Number:  [id number]  Date Initiated:  02/24/2014  Documentation initiated by:  Elissa Hefty  Subjective/Objective Assessment:   adm w anemia     Action/Plan:   lives alone, pcp dr Alain Marion   Anticipated DC Date:  02/24/2014   Anticipated DC Plan:  HOME/SELF CARE         Choice offered to / List presented to:             Status of service:   Medicare Important Message given?  YES (If response is "NO", the following Medicare IM given date fields will be blank) Date Medicare IM given:  02/24/2014 Medicare IM given by:  Elissa Hefty Date Additional Medicare IM given:   Additional Medicare IM given by:    Discharge Disposition:  HOME/SELF CARE  Per UR Regulation:    If discussed at Long Length of Stay Meetings, dates discussed:    Comments:

## 2014-02-24 NOTE — Plan of Care (Signed)
Problem: Discharge Progression Outcomes Goal: Other Discharge Outcomes/Goals Outcome: Completed/Met Date Met:  02/24/14 GI fu appointments made 03/06/14

## 2014-02-27 ENCOUNTER — Ambulatory Visit: Payer: Medicare Other | Admitting: Internal Medicine

## 2014-02-28 ENCOUNTER — Other Ambulatory Visit (INDEPENDENT_AMBULATORY_CARE_PROVIDER_SITE_OTHER): Payer: Medicare Other

## 2014-02-28 ENCOUNTER — Ambulatory Visit (INDEPENDENT_AMBULATORY_CARE_PROVIDER_SITE_OTHER): Payer: Medicare Other | Admitting: Internal Medicine

## 2014-02-28 ENCOUNTER — Encounter: Payer: Self-pay | Admitting: Internal Medicine

## 2014-02-28 VITALS — BP 128/64 | HR 72 | Temp 98.5°F | Resp 16 | Wt 236.0 lb

## 2014-02-28 DIAGNOSIS — I10 Essential (primary) hypertension: Secondary | ICD-10-CM

## 2014-02-28 DIAGNOSIS — F4321 Adjustment disorder with depressed mood: Secondary | ICD-10-CM

## 2014-02-28 DIAGNOSIS — D509 Iron deficiency anemia, unspecified: Secondary | ICD-10-CM

## 2014-02-28 DIAGNOSIS — I2510 Atherosclerotic heart disease of native coronary artery without angina pectoris: Secondary | ICD-10-CM

## 2014-02-28 DIAGNOSIS — E538 Deficiency of other specified B group vitamins: Secondary | ICD-10-CM

## 2014-02-28 LAB — CBC WITH DIFFERENTIAL/PLATELET
Basophils Absolute: 0.1 10*3/uL (ref 0.0–0.1)
Basophils Relative: 1.4 % (ref 0.0–3.0)
Eosinophils Absolute: 0.2 10*3/uL (ref 0.0–0.7)
Eosinophils Relative: 4.6 % (ref 0.0–5.0)
HCT: 29.1 % — ABNORMAL LOW (ref 39.0–52.0)
Hemoglobin: 8.9 g/dL — ABNORMAL LOW (ref 13.0–17.0)
Lymphocytes Relative: 27.1 % (ref 12.0–46.0)
Lymphs Abs: 1.4 10*3/uL (ref 0.7–4.0)
MCHC: 30.8 g/dL (ref 30.0–36.0)
MCV: 73.8 fl — ABNORMAL LOW (ref 78.0–100.0)
Monocytes Absolute: 0.9 10*3/uL (ref 0.1–1.0)
Monocytes Relative: 17.9 % — ABNORMAL HIGH (ref 3.0–12.0)
Neutro Abs: 2.6 10*3/uL (ref 1.4–7.7)
Neutrophils Relative %: 49 % (ref 43.0–77.0)
Platelets: 266 10*3/uL (ref 150.0–400.0)
RBC: 3.95 Mil/uL — ABNORMAL LOW (ref 4.22–5.81)
RDW: 21.7 % — ABNORMAL HIGH (ref 11.5–15.5)
WBC: 5.2 10*3/uL (ref 4.0–10.5)

## 2014-02-28 NOTE — Progress Notes (Signed)
Patient ID: [name], male   DOB: July 22, 1941, 73 y.o.   MRN: [id number]   Subjective:    HPI F/u recent hosp stay: 1. Pt will need to follow up with PCP in 2-3 weeks post discharge 2. Please obtain BMP to evaluate electrolytes and kidney function 3. Please also check CBC to evaluate Hg and Hct levels 4. Follow up with GI, Dr. Blanch Media office, for colonoscopy Discharge Diagnoses:  Principal Problem:  Anemia, iron deficiency  Active Problems:  HYPERLIPIDEMIA  INSOMNIA, PERSISTENT  CORONARY ARTERY DISEASE  Edema  Heme positive stool  BPH (benign prostatic hypertrophy)  Discharge Condition: Stable  Diet recommendation: Regular diet  History of present illness as in H&P by Dr. [name]:  This is a 73 y.o. year old male with noted prior hx/o CAD, HTN, LBP presenting with fatigue, anemia, LE swelling. Patient was seen by his PCP for symptoms approximately 2 weeks ago. Was initially started on hydrochlorothiazide with followup blood work. Patient with a hemoglobin of around 7. Clinic tried to contact patient to discuss results. However, patient was not able to be contacted. Patient presented again today at PCP for followup. Note hemoglobin 6.8. Patient was redirected to come to the ER for further evaluation. Patient does report that he is at worsening dyspnea fatigue over the past month. Is usually able to cycle for up to 30-40 loss. Cycle this week and up to 8 miles and felt extremely tired. Denies any chest pain, nausea, diaphoresis. Does take a full dose aspirin daily. Does use NSAIDs intermittently. Denies any melanotic stools or diarrhea. No nausea or vomiting. Swelling has improved somewhat since being on low-dose hydrochlorothiazide. Reports a lot of 4 pound weight loss from last PCP visit to today's visit. Was also Hemoccult negative at PCP visit today. States that he has had normal colonoscopy x 3.  On presentation, hemodynamically stable. Satting greater than 99% on room air.  Hemoglobin 6.2. Troponin and BNP within normal limits. Cr WNL. Pt type and screened. 2 unit pRBC transfusion pending.  Hospital Course:  Principal Problem:  Heme positive stool and Anemia, iron deficiency: Mr. Stai was admitted for anemia and heme positive stool. He required a blood transfusion, which improved his H/H from 6.0 at nadir to 8.5 at discharged. He had a GI consultation and an EGD was done which did not show anything to explain his bleeding. He was scheduled for a colonoscopy as an outpatient with Dr. Henrene Pastor. His fatigue was improved on discharge. He was discharged on oral iron and advised to limit/stop his naproxen use.  Active Problems:  Edema: TTE revealed grade 2 diastolic dysfunction. He was continued on his home regimen of lasix and this was improved on discharge.  HYPERLIPIDEMIA: stable, continued on zocor.  CORONARY ARTERY DISEASE: No acute issues while in hospital  BPH (benign prostatic hypertrophy): discharged on home flomax.     F/u anemia with Hgb 7.0 prior to adm F/u weakness in B thighs when walking x 3-4 months; they feel "tired". No LBP. No leg pain. The patient presents for a follow-up of  chronic hypertension, chronic dyslipidemia, CAD controlled with medicines.     Wt Readings from Last 3 Encounters:  02/28/14 236 lb (107.049 kg)  02/24/14 240 lb 12.8 oz (109.226 kg)  02/24/14 240 lb 12.8 oz (109.226 kg)   BP Readings from Last 3 Encounters:  02/28/14 128/64  02/24/14 106/54  02/24/14 106/54      Review of Systems  Constitutional: Negative for appetite change, fatigue  and unexpected weight change.  HENT: Negative for nosebleeds and trouble swallowing.   Eyes: Negative for itching and visual disturbance.  Respiratory: Positive for chest tightness.   Cardiovascular: Negative for palpitations and leg swelling.  Gastrointestinal: Negative for blood in stool and abdominal distention.  Genitourinary: Negative for frequency and hematuria.   Musculoskeletal: Positive for arthralgias and back pain. Negative for gait problem and joint swelling.  Neurological: Negative for dizziness, tremors, speech difficulty and weakness.  Psychiatric/Behavioral: Negative for suicidal ideas, sleep disturbance, dysphoric mood and agitation. The patient is not nervous/anxious.        Objective:   Physical Exam  Constitutional: He is oriented to person, place, and time. He appears well-developed.  HENT:  Mouth/Throat: Oropharynx is clear and moist. No oropharyngeal exudate.  eryth throat  Eyes: Conjunctivae are normal. Pupils are equal, round, and reactive to light.  Neck: Normal range of motion. No JVD present. No thyromegaly present.  Cardiovascular: Normal rate, regular rhythm, normal heart sounds and intact distal pulses.  Exam reveals no gallop and no friction rub.   No murmur heard. Pulmonary/Chest: Effort normal and breath sounds normal. No respiratory distress. He has no wheezes. He has no rales. He exhibits no tenderness.  Abdominal: Soft. Bowel sounds are normal. He exhibits no distension and no mass. There is no tenderness. There is no rebound and no guarding.  Genitourinary: Rectum normal and prostate normal. Guaiac negative stool.  Musculoskeletal: Normal range of motion. He exhibits edema (1+) and tenderness (R radial wrist is tender).  Lymphadenopathy:    He has no cervical adenopathy.  Neurological: He is alert and oriented to person, place, and time. He has normal reflexes. No cranial nerve deficit. He exhibits normal muscle tone. Coordination normal.  Skin: Skin is warm and dry. No rash noted.  Psychiatric: He has a normal mood and affect. His behavior is normal. Judgment and thought content normal.  Tanned but pale skin - better  Lab Results  Component Value Date   WBC 5.3 02/23/2014   HGB 8.5* 02/24/2014   HCT 29.2* 02/24/2014   PLT 212 02/23/2014   GLUCOSE 89 02/24/2014   CHOL 162 11/07/2013   TRIG 122.0 11/07/2013   HDL 57.30  11/07/2013   LDLDIRECT 157.5 07/05/2008   LDLCALC 80 11/07/2013   ALT 9 02/24/2014   AST 14 02/24/2014   NA 143 02/24/2014   K 3.9 02/24/2014   CL 105 02/24/2014   CREATININE 0.73 02/24/2014   BUN 13 02/24/2014   CO2 27 02/24/2014   TSH 1.290 02/21/2014   PSA 0.49 03/01/2012   INR 1.18 02/22/2014        Assessment & Plan:

## 2014-02-28 NOTE — Assessment & Plan Note (Signed)
Continue with current prescription therapy as reflected on the Med list.  

## 2014-02-28 NOTE — Progress Notes (Signed)
Pre visit review using our clinic review tool, if applicable. No additional management support is needed unless otherwise documented below in the visit note. 

## 2014-02-28 NOTE — Assessment & Plan Note (Addendum)
7/15 GI bleed ? Source  --?AVMs Dr Henrene Pastor on [facility] CBC

## 2014-02-28 NOTE — Assessment & Plan Note (Signed)
Diet controlled.  

## 2014-02-28 NOTE — Assessment & Plan Note (Signed)
Doing better.   

## 2014-03-01 ENCOUNTER — Telehealth: Payer: Self-pay | Admitting: *Deleted

## 2014-03-01 NOTE — Telephone Encounter (Signed)
Left msg on triage wanting results back from labs that was done yesterday...[name]

## 2014-03-01 NOTE — Telephone Encounter (Signed)
Hgb 8.9 - not bad! Keep appt w/Dr Janace Aris

## 2014-03-02 ENCOUNTER — Ambulatory Visit (AMBULATORY_SURGERY_CENTER): Payer: Self-pay | Admitting: *Deleted

## 2014-03-02 VITALS — Ht 72.0 in | Wt 238.2 lb

## 2014-03-02 DIAGNOSIS — D509 Iron deficiency anemia, unspecified: Secondary | ICD-10-CM

## 2014-03-02 MED ORDER — MOVIPREP 100 G PO SOLR: ORAL | Status: DC

## 2014-03-02 NOTE — Telephone Encounter (Signed)
Called pt no answer LMOM with md response.../lmb 

## 2014-03-02 NOTE — Progress Notes (Signed)
No allergies to eggs or soy. No problems with anesthesia.  Pt not given Emmi instructions for colonoscopy; no computer access  No oxygen use  No diet drug use  

## 2014-03-06 ENCOUNTER — Encounter: Payer: Self-pay | Admitting: Internal Medicine

## 2014-03-06 ENCOUNTER — Emergency Department (HOSPITAL_COMMUNITY): Payer: Medicare Other

## 2014-03-06 ENCOUNTER — Emergency Department (HOSPITAL_COMMUNITY)
Admission: EM | Admit: 2014-03-06 | Discharge: 2014-03-07 | Disposition: A | Discharge status: Home / Self Care | Payer: Medicare Other | Attending: Emergency Medicine | Admitting: Emergency Medicine

## 2014-03-06 ENCOUNTER — Ambulatory Visit (AMBULATORY_SURGERY_CENTER): Payer: Medicare Other | Admitting: Internal Medicine

## 2014-03-06 ENCOUNTER — Encounter (HOSPITAL_COMMUNITY): Payer: Self-pay | Admitting: Emergency Medicine

## 2014-03-06 VITALS — BP 128/78 | HR 57 | Temp 98.7°F | Resp 20 | Ht 72.0 in | Wt 238.0 lb

## 2014-03-06 DIAGNOSIS — Z8669 Personal history of other diseases of the nervous system and sense organs: Secondary | ICD-10-CM | POA: Insufficient documentation

## 2014-03-06 DIAGNOSIS — Z9889 Other specified postprocedural states: Secondary | ICD-10-CM | POA: Insufficient documentation

## 2014-03-06 DIAGNOSIS — R110 Nausea: Secondary | ICD-10-CM | POA: Insufficient documentation

## 2014-03-06 DIAGNOSIS — D126 Benign neoplasm of colon, unspecified: Secondary | ICD-10-CM

## 2014-03-06 DIAGNOSIS — R1013 Epigastric pain: Secondary | ICD-10-CM | POA: Insufficient documentation

## 2014-03-06 DIAGNOSIS — Z7982 Long term (current) use of aspirin: Secondary | ICD-10-CM | POA: Insufficient documentation

## 2014-03-06 DIAGNOSIS — Z87891 Personal history of nicotine dependence: Secondary | ICD-10-CM | POA: Insufficient documentation

## 2014-03-06 DIAGNOSIS — N400 Enlarged prostate without lower urinary tract symptoms: Secondary | ICD-10-CM | POA: Insufficient documentation

## 2014-03-06 DIAGNOSIS — E785 Hyperlipidemia, unspecified: Secondary | ICD-10-CM | POA: Insufficient documentation

## 2014-03-06 DIAGNOSIS — R109 Unspecified abdominal pain: Secondary | ICD-10-CM

## 2014-03-06 DIAGNOSIS — I2510 Atherosclerotic heart disease of native coronary artery without angina pectoris: Secondary | ICD-10-CM | POA: Insufficient documentation

## 2014-03-06 DIAGNOSIS — D509 Iron deficiency anemia, unspecified: Secondary | ICD-10-CM

## 2014-03-06 DIAGNOSIS — M1990 Unspecified osteoarthritis, unspecified site: Secondary | ICD-10-CM | POA: Insufficient documentation

## 2014-03-06 LAB — CBC WITH DIFFERENTIAL/PLATELET
Basophils Absolute: 0 10*3/uL (ref 0.0–0.1)
Basophils Relative: 0 % (ref 0–1)
Eosinophils Absolute: 0.2 10*3/uL (ref 0.0–0.7)
Eosinophils Relative: 2 % (ref 0–5)
HCT: 31 % — ABNORMAL LOW (ref 39.0–52.0)
Hemoglobin: 9.1 g/dL — ABNORMAL LOW (ref 13.0–17.0)
Lymphocytes Relative: 5 % — ABNORMAL LOW (ref 12–46)
Lymphs Abs: 0.5 10*3/uL — ABNORMAL LOW (ref 0.7–4.0)
MCH: 22.3 pg — ABNORMAL LOW (ref 26.0–34.0)
MCHC: 29.4 g/dL — ABNORMAL LOW (ref 30.0–36.0)
MCV: 76 fL — ABNORMAL LOW (ref 78.0–100.0)
Monocytes Absolute: 1.1 10*3/uL — ABNORMAL HIGH (ref 0.1–1.0)
Monocytes Relative: 10 % (ref 3–12)
Neutro Abs: 9 10*3/uL — ABNORMAL HIGH (ref 1.7–7.7)
Neutrophils Relative %: 83 % — ABNORMAL HIGH (ref 43–77)
Platelets: 238 10*3/uL (ref 150–400)
RBC: 4.08 MIL/uL — ABNORMAL LOW (ref 4.22–5.81)
RDW: 19.3 % — ABNORMAL HIGH (ref 11.5–15.5)
WBC: 10.8 10*3/uL — ABNORMAL HIGH (ref 4.0–10.5)

## 2014-03-06 LAB — TYPE AND SCREEN
ABO/RH(D): O POS
Antibody Screen: NEGATIVE

## 2014-03-06 LAB — COMPREHENSIVE METABOLIC PANEL
ALT: 54 U/L — ABNORMAL HIGH (ref 0–53)
AST: 130 U/L — ABNORMAL HIGH (ref 0–37)
Albumin: 3.7 g/dL (ref 3.5–5.2)
Alkaline Phosphatase: 111 U/L (ref 39–117)
Anion gap: 15 (ref 5–15)
BUN: 10 mg/dL (ref 6–23)
CO2: 22 mEq/L (ref 19–32)
Calcium: 9.2 mg/dL (ref 8.4–10.5)
Chloride: 103 mEq/L (ref 96–112)
Creatinine, Ser: 0.71 mg/dL (ref 0.50–1.35)
GFR calc Af Amer: >90 mL/min (ref >90)
GFR calc non Af Amer: >90 mL/min (ref >90)
Glucose, Bld: 110 mg/dL — ABNORMAL HIGH (ref 70–99)
Potassium: 3.9 mEq/L (ref 3.7–5.3)
Sodium: 140 mEq/L (ref 137–147)
Total Bilirubin: 0.7 mg/dL (ref 0.3–1.2)
Total Protein: 6.7 g/dL (ref 6.0–8.3)

## 2014-03-06 LAB — I-STAT CG4 LACTIC ACID, ED: Lactic Acid, Venous: 1.67 mmol/L (ref 0.5–2.2)

## 2014-03-06 LAB — I-STAT TROPONIN, ED: Troponin i, poc: 0 ng/mL (ref 0.00–0.08)

## 2014-03-06 MED ORDER — SODIUM CHLORIDE 0.9 % IV SOLN: 500.0000 mL | INTRAVENOUS | Status: DC

## 2014-03-06 MED ORDER — IOHEXOL 300 MG/ML  SOLN
100.0000 mL | Freq: Once | INTRAMUSCULAR | Status: AC | PRN
  Administered 2014-03-06: 100 mL via INTRAVENOUS

## 2014-03-06 NOTE — Patient Instructions (Signed)
\YOU HAD AN ENDOSCOPIC PROCEDURE TODAY AT THE [facility]: Refer to the procedure report that was given to you for any specific questions about what was found during the examination.  If the procedure report does not answer your questions, please call your gastroenterologist to clarify.  If you requested that your care partner not be given the details of your procedure findings, then the procedure report has been included in a sealed envelope for you to review at your convenience later.  YOU SHOULD EXPECT: Some feelings of bloating in the abdomen. Passage of more gas than usual.  Walking can help get rid of the air that was put into your GI tract during the procedure and reduce the bloating. If you had a lower endoscopy (such as a colonoscopy or flexible sigmoidoscopy) you may notice spotting of blood in your stool or on the toilet paper. If you underwent a bowel prep for your procedure, then you may not have a normal bowel movement for a few days.  DIET: Your first meal following the procedure should be a light meal and then it is ok to progress to your normal diet.  A half-sandwich or bowl of soup is an example of a good first meal.  Heavy or fried foods are harder to digest and may make you feel nauseous or bloated.  Likewise meals heavy in dairy and vegetables can cause extra gas to form and this can also increase the bloating.  Drink plenty of fluids but you should avoid alcoholic beverages for 24 hours.  ACTIVITY: Your care partner should take you home directly after the procedure.  You should plan to take it easy, moving slowly for the rest of the day.  You can resume normal activity the day after the procedure however you should NOT DRIVE or use heavy machinery for 24 hours (because of the sedation medicines used during the test).    SYMPTOMS TO REPORT IMMEDIATELY: A gastroenterologist can be reached at any hour.  During normal business hours, 8:30 AM to 5:00 PM Monday through Friday,  call [phone].  After hours and on weekends, please call the GI answering service at [phone] who will take a message and have the physician on call contact you.   Following lower endoscopy (colonoscopy or flexible sigmoidoscopy):  Excessive amounts of blood in the stool  Significant tenderness or worsening of abdominal pains  Swelling of the abdomen that is new, acute  Fever of 100F or higher    FOLLOW UP: If any biopsies were taken you will be contacted by phone or by letter within the next 1-3 weeks.  Call your gastroenterologist if you have not heard about the biopsies in 3 weeks.  Our staff will call the home number listed on your records the next business day following your procedure to check on you and address any questions or concerns that you may have at that time regarding the information given to you following your procedure. This is a courtesy call and so if there is no answer at the home number and we have not heard from you through the emergency physician on call, we will assume that you have returned to your regular daily activities without incident.  SIGNATURES/CONFIDENTIALITY:   Polyp information given.   Resume oral iron twice daily. Dr. Blanch Media nurse will contact you regarding a Capsule Endoscopy for iron deficiency anemia. Repeat CBC in four weeks. You and/or your care partner have signed paperwork which will be entered into your electronic  medical record.  These signatures attest to the fact that that the information above on your After Visit Summary has been reviewed and is understood.  Full responsibility of the confidentiality of this discharge information lies with you and/or your care-partner.Marland Kitchen

## 2014-03-06 NOTE — ED Notes (Signed)
Family at bedside confirmed that patient had a polyp removed during colonoscopy today.

## 2014-03-06 NOTE — ED Notes (Signed)
CT called to inform of patient having finished contrast.

## 2014-03-06 NOTE — ED Notes (Signed)
I Stat Lactic Acid results shown to Dr. Rosalyn Gess

## 2014-03-06 NOTE — Progress Notes (Signed)
Report to PACU, RN, vss, BBS= Clear.  

## 2014-03-06 NOTE — ED Notes (Signed)
Patient here with complaint of epigastric pain which eventually progressed to diffuse abdominal pain. Patient had colonoscopy done today, review of paperwork did not reveal resection of tissues. Patient is not sure whether anything was removed during the procedure. Accompanying paperwork doesn't say whether they removed anything or not. Patient was initially diaphoretic, nauseated, and hypotensive with SBP around 90. Abdomen distended and firm. EMS gave about 357mL of NS via IV. Patient took 324mg  ASA PO prior to EMS arrival per dispatch recommendation. Patient more comfortable while laying flat. Pain initially rated at 8/10 when laying flat pain dropped to 2/10.

## 2014-03-06 NOTE — ED Provider Notes (Signed)
CSN: [id number]     Arrival date & time 03/06/14  2130 History   First MD Initiated Contact with Patient 03/06/14 2149     Chief Complaint  Patient presents with  . Abdominal Pain     (Consider location/radiation/quality/duration/timing/severity/associated sxs/prior Treatment) HPI Comments: 73 year old male with lipids, high blood pressure, coronary artery disease, past smoking, prostate hypertrophy, anemia presents with epigastric pain/pressure since eating dinner this evening. Patient never had this in the past. Patient colonoscopy earlier at 4 PM which reportedly went well and had one small polyp. Patient has had worsening pain since eating. No history of gallbladder problems or bowel obstruction. No other abdominal surgery history. Patient denies fever chills but does have nausea. Patient opacification, gas earlier today. No stool since colonoscopy. Nothing improves the symptoms and constant. Patient recently had admission for anemia and echo done  Patient is a 73 y.o. male presenting with abdominal pain. The history is provided by the patient.  Abdominal Pain Associated symptoms: nausea   Associated symptoms: no chest pain, no chills, no dysuria, no fever, no shortness of breath and no vomiting     Past Medical History  Diagnosis Date  . Hyperlipidemia   . Osteoarthritis   . LBP (low back pain)   . Insomnia   . CAD (coronary artery disease) 2000    Dr Gay Filler test negative  . OSA (obstructive sleep apnea)     lost 40 lb-does not snore-wife says he is not apnic now  . BPH (benign prostatic hyperplasia)   . CTS (carpal tunnel syndrome)     better  . Sleep apnea     no cpap  . Blood transfusion without reported diagnosis 02/23/14    3 units for iron deficiency anemia   Past Surgical History  Procedure Laterality Date  . Total knee arthroplasty  2003    Right  . Lumbar fusion  2007    [place], Dr Vertell Limber  . Carpal tunnel release Bilateral 1996, 2004    Dr Lenoard Aden thumb  surgery.   . Tonsillectomy  1946  . Eye surgery  2012    both cataracts, Lasik  . Colonoscopy  2005, 2012    diverticulosis.   . Esophagogastroduodenoscopy N/A 02/23/2014    Procedure: ESOPHAGOGASTRODUODENOSCOPY (EGD);  Surgeon: Irene Shipper, MD;  Location: [facility];  Service: Endoscopy;  Laterality: N/A;   Family History  Problem Relation Age of Onset  . Aneurysm Father     AAA  . Heart disease Father   . Cancer Brother   . Mental illness Brother     dementia  . Stomach cancer Brother   . Coronary artery disease Other   . Breast cancer Other   . Cancer Mother 57    breast cancer  . Heart disease Mother     MI  . Colon cancer Neg Hx   . Esophageal cancer Neg Hx   . Pancreatic cancer Neg Hx   . Prostate cancer Neg Hx   . Rectal cancer Neg Hx    History  Substance Use Topics  . Smoking status: Former Smoker    Quit date: 01/08/1987  . Smokeless tobacco: Never Used  . Alcohol Use: Yes     Comment: rare    Review of Systems  Constitutional: Negative for fever and chills.  HENT: Negative for congestion.   Eyes: Negative for visual disturbance.  Respiratory: Negative for shortness of breath.   Cardiovascular: Negative for chest pain.  Gastrointestinal: Positive for nausea and abdominal pain.  Negative for vomiting and blood in stool.  Genitourinary: Negative for dysuria and flank pain.  Musculoskeletal: Negative for back pain, neck pain and neck stiffness.  Skin: Negative for rash.  Neurological: Negative for light-headedness and headaches.      Allergies  Review of patient's allergies indicates no known allergies.  Home Medications   Prior to Admission medications   Medication Sig Start Date End Date Taking? Authorizing Provider  aspirin 81 MG chewable tablet Chew 81 mg by mouth at bedtime.   Yes Historical Provider, MD  Avanafil (STENDRA) 100 MG TABS Take 100 mg by mouth daily as needed (erectile dysfunction).   Yes Historical Provider, MD  Cyanocobalamin  (VITAMIN B-12) 1000 MCG SUBL Place 1 tablet (1,000 mcg total) under the tongue daily. 02/21/14  Yes Aleksei [name] V, MD  DULoxetine (CYMBALTA) 60 MG capsule Take 60 mg by mouth at bedtime.   Yes Historical Provider, MD  folic acid (V-R FOLIC ACID) 828 MCG tablet Take 1 tablet (400 mcg total) by mouth daily. 02/21/14  Yes Aleksei [name] V, MD  furosemide (LASIX) 20 MG tablet Take 1-2 tablets (20-40 mg total) by mouth daily as needed for edema. 01/24/14  Yes Aleksei [name] V, MD  iron polysaccharides (NIFEREX) 150 MG capsule Take 1 capsule (150 mg total) by mouth daily. 02/21/14  Yes Aleksei [name] V, MD  naproxen sodium (ANAPROX) 220 MG tablet Take 220 mg by mouth daily as needed (pain).   Yes Historical Provider, MD  simvastatin (ZOCOR) 40 MG tablet Take 40 mg by mouth at bedtime.   Yes Historical Provider, MD  tamsulosin (FLOMAX) 0.4 MG CAPS capsule Take 0.4 mg by mouth daily after supper.   Yes Historical Provider, MD   BP 124/63  Pulse 75  Temp(Src) 97.4 F (36.3 C) (Oral)  Resp 20  Ht 6' (1.829 m)  Wt 235 lb (106.595 kg)  BMI 31.86 kg/m2  SpO2 100% Physical Exam  Nursing note and vitals reviewed. Constitutional: He is oriented to person, place, and time. He appears well-developed and well-nourished.  HENT:  Head: Normocephalic and atraumatic.  Dry mucous membranes  Eyes: Conjunctivae are normal. Right eye exhibits no discharge. Left eye exhibits no discharge.  Neck: Normal range of motion. Neck supple. No tracheal deviation present.  Cardiovascular: Normal rate and regular rhythm.   Pulmonary/Chest: Effort normal and breath sounds normal.  Abdominal: Soft. Bowel sounds are normal. He exhibits no distension. There is tenderness (epigastric with mild distention). There is no guarding.  Musculoskeletal: He exhibits no edema and no tenderness.  Neurological: He is alert and oriented to person, place, and time.  Skin: Skin is warm. No rash noted.  Psychiatric: He has a normal  mood and affect.    ED Course  Procedures (including critical care time) Labs Review Labs Reviewed  CBC WITH DIFFERENTIAL - Abnormal; Notable for the following:    WBC 10.8 (*)    RBC 4.08 (*)    Hemoglobin 9.1 (*)    HCT 31.0 (*)    MCV 76.0 (*)    MCH 22.3 (*)    MCHC 29.4 (*)    RDW 19.3 (*)    Neutrophils Relative % 83 (*)    Neutro Abs 9.0 (*)    Lymphocytes Relative 5 (*)    Lymphs Abs 0.5 (*)    Monocytes Absolute 1.1 (*)    All other components within normal limits  COMPREHENSIVE METABOLIC PANEL - Abnormal; Notable for the following:    Glucose, Bld 110 (*)  AST 130 (*)    ALT 54 (*)    All other components within normal limits  URINALYSIS, ROUTINE W REFLEX MICROSCOPIC  LIPASE, BLOOD  I-STAT TROPOININ, ED  I-STAT CG4 LACTIC ACID, ED  TYPE AND SCREEN    Imaging Review No results found.   EKG Interpretation   Date/Time:  Monday March 06 2014 21:49:12 EDT Ventricular Rate:  67 PR Interval:  193 QRS Duration: 93 QT Interval:  415 QTC Calculation: 438 R Axis:   58 Text Interpretation:  Sinus rhythm Confirmed by Rylah Fukuda  MD, Tharun Cappella ([id number])  on 03/06/2014 9:58:42 PM      MDM   Final diagnoses:  Abdominal pain, unspecified abdominal location  Status post colonoscopy  Anemia  Patient with recent colonoscopy and severe abdominal pain since eating. Discussed differential including consultation for colonoscopy, bowel obstruction, other. Then for blood work, CT abdomen pelvis and pain meds as needed. Patient recently had an EGD with no ulcer. Dr Henrene Pastor GI.   Patient care signed out to ED provider to fup results of CT and reassess for dispo.       Mariea Clonts, MD 03/08/14 0900

## 2014-03-06 NOTE — Op Note (Signed)
[place]  [address] [place], [id number]   COLONOSCOPY PROCEDURE REPORT  PATIENT: [name], [name]  MR#: [id number] BIRTHDATE: 03-01-1941 , 72  yrs. old GENDER: Male ENDOSCOPIST: Eustace Quail, MD REFERRED BY:.  [facility] (was seen in the hospital) PROCEDURE DATE:  03/06/2014 PROCEDURE:   Colonoscopy with snare polypectomy x1 First Screening Colonoscopy - Avg.  risk and is 50 yrs.  old or older - No.  Prior Negative Screening - Now for repeat screening. N/A  History of Adenoma - Now for follow-up colonoscopy & has been > or = to 3 yrs.  N/A  Polyps Removed Today? Yes. ASA CLASS:   Class II INDICATIONS:Iron Deficiency Anemia and heme-positive stool.   . Last colonoscopy January 2012 was normal MEDICATIONS: MAC sedation, administered by CRNA and propofol (Diprivan) 360mg  IV  DESCRIPTION OF PROCEDURE:   After the risks benefits and alternatives of the procedure were thoroughly explained, informed consent was obtained.  A digital rectal exam revealed no abnormalities of the rectum.   The LB [id number]  endoscope was introduced through the anus and advanced to the cecum, which was identified by both the appendix and ileocecal valve. No adverse events experienced.   The quality of the prep was excellent, using MoviPrep  The instrument was then slowly withdrawn as the colon was fully examined.      COLON FINDINGS: A diminutive polyp was found in the transverse colon.  A polypectomy was performed with a cold snare.  The resection was complete and the polyp tissue was completely retrieved.   The colon mucosa was otherwise normal.  Retroflexed views revealed internal hemorrhoids. The time to cecum=3 minutes 59 seconds.  Withdrawal time=14 minutes 49 seconds.  The scope was withdrawn and the procedure completed. COMPLICATIONS: There were no complications.  ENDOSCOPIC IMPRESSION: 1.   Diminutive polyp was found in the transverse colon;  polypectomy was performed with a cold snare 2.   The colon mucosa was otherwise normal  RECOMMENDATIONS: 1. Resume oral iron therapy twice daily 2. My office nurse will contact you regarding a Capsule endoscopy"iron deficiency anemia, heme positive stool" 3. Repeat CBC in 4 weeks 4. No routine surveillance colonoscopy planned given current age   eSigned:  Eustace Quail, MD 03/06/2014 4:03 PM   cc: Altamese Orason.  [name], MD and The Patient

## 2014-03-06 NOTE — Progress Notes (Signed)
Called to room to assist during endoscopic procedure.  Patient ID and intended procedure confirmed with present staff. Received instructions for my participation in the procedure from the performing physician.  

## 2014-03-07 ENCOUNTER — Telehealth: Payer: Self-pay

## 2014-03-07 LAB — URINALYSIS, ROUTINE W REFLEX MICROSCOPIC
Bilirubin Urine: NEGATIVE
Glucose, UA: NEGATIVE mg/dL
Hgb urine dipstick: NEGATIVE
Ketones, ur: NEGATIVE mg/dL
Leukocytes, UA: NEGATIVE
Nitrite: NEGATIVE
Protein, ur: NEGATIVE mg/dL
Specific Gravity, Urine: 1.016 (ref 1.005–1.030)
Urobilinogen, UA: 1 mg/dL (ref 0.0–1.0)
pH: 5.5 (ref 5.0–8.0)

## 2014-03-07 LAB — LIPASE, BLOOD: Lipase: 30 U/L (ref 11–59)

## 2014-03-07 NOTE — ED Provider Notes (Signed)
Care assumed from Dr. Reather Converse at shift change. Patient is waiting the results of a CT scan of the abdomen and pelvis. He underwent a colonoscopy earlier today, then developed severe abdominal pain. CT scan reveals no evidence for perforation or other acute abnormality. Patient was reevaluated and tells me that he feels much better and is not complaining of any discomfort. I feel as though he is appropriate for discharge with when necessary followup. He was advised to return if he develops Luddy stool, worsening pain, high fever, or other problems.  Veryl Speak, MD 03/07/14 [phone]

## 2014-03-07 NOTE — Discharge Instructions (Signed)
Return to the emergency department if he develops severe abdominal pain, bloody stool, high fever, or other new and concerning symptoms.   Abdominal Pain Many things can cause abdominal pain. Usually, abdominal pain is not caused by a disease and will improve without treatment. It can often be observed and treated at home. Your health care provider will do a physical exam and possibly order blood tests and X-rays to help determine the seriousness of your pain. However, in many cases, more time must pass before a clear cause of the pain can be found. Before that point, your health care provider may not know if you need more testing or further treatment. HOME CARE INSTRUCTIONS  Monitor your abdominal pain for any changes. The following actions may help to alleviate any discomfort you are experiencing:  Only take over-the-counter or prescription medicines as directed by your health care provider.  Do not take laxatives unless directed to do so by your health care provider.  Try a clear liquid diet (broth, tea, or water) as directed by your health care provider. Slowly move to a bland diet as tolerated. SEEK MEDICAL CARE IF:  You have unexplained abdominal pain.  You have abdominal pain associated with nausea or diarrhea.  You have pain when you urinate or have a bowel movement.  You experience abdominal pain that wakes you in the night.  You have abdominal pain that is worsened or improved by eating food.  You have abdominal pain that is worsened with eating fatty foods.  You have a fever. SEEK IMMEDIATE MEDICAL CARE IF:   Your pain does not go away within 2 hours.  You keep throwing up (vomiting).  Your pain is felt only in portions of the abdomen, such as the right side or the left lower portion of the abdomen.  You pass bloody or black tarry stools. MAKE SURE YOU:  Understand these instructions.   Will watch your condition.   Will get help right away if you are not doing  well or get worse.  Document Released: 05/21/2005 Document Revised: 08/16/2013 Document Reviewed: 04/20/2013 [facility] Patient Information 2015 [place], [place]. This information is not intended to replace advice given to you by your health care provider. Make sure you discuss any questions you have with your health care provider.

## 2014-03-07 NOTE — Telephone Encounter (Signed)
No answer, left vm 

## 2014-03-08 ENCOUNTER — Other Ambulatory Visit (INDEPENDENT_AMBULATORY_CARE_PROVIDER_SITE_OTHER): Payer: Medicare Other

## 2014-03-08 DIAGNOSIS — D509 Iron deficiency anemia, unspecified: Secondary | ICD-10-CM

## 2014-03-08 LAB — CBC WITH DIFFERENTIAL/PLATELET
Basophils Absolute: 0 10*3/uL (ref 0.0–0.1)
Basophils Relative: 0.7 % (ref 0.0–3.0)
Eosinophils Absolute: 0.2 10*3/uL (ref 0.0–0.7)
Eosinophils Relative: 4 % (ref 0.0–5.0)
HCT: 27.4 % — ABNORMAL LOW (ref 39.0–52.0)
Hemoglobin: 8.5 g/dL — ABNORMAL LOW (ref 13.0–17.0)
Lymphocytes Relative: 23.1 % (ref 12.0–46.0)
Lymphs Abs: 1.4 10*3/uL (ref 0.7–4.0)
MCHC: 31 g/dL (ref 30.0–36.0)
MCV: 72.3 fl — ABNORMAL LOW (ref 78.0–100.0)
Monocytes Absolute: 1.2 10*3/uL — ABNORMAL HIGH (ref 0.1–1.0)
Monocytes Relative: 20 % — ABNORMAL HIGH (ref 3.0–12.0)
Neutro Abs: 3.2 10*3/uL (ref 1.4–7.7)
Neutrophils Relative %: 52.2 % (ref 43.0–77.0)
Platelets: 225 10*3/uL (ref 150.0–400.0)
RBC: 3.78 Mil/uL — ABNORMAL LOW (ref 4.22–5.81)
RDW: 22.3 % — ABNORMAL HIGH (ref 11.5–15.5)
WBC: 6.1 10*3/uL (ref 4.0–10.5)

## 2014-03-10 ENCOUNTER — Encounter: Payer: Self-pay | Admitting: Internal Medicine

## 2014-03-13 ENCOUNTER — Other Ambulatory Visit: Payer: Self-pay

## 2014-03-13 DIAGNOSIS — D509 Iron deficiency anemia, unspecified: Secondary | ICD-10-CM

## 2014-03-14 ENCOUNTER — Telehealth: Payer: Self-pay | Admitting: Internal Medicine

## 2014-03-14 ENCOUNTER — Other Ambulatory Visit (INDEPENDENT_AMBULATORY_CARE_PROVIDER_SITE_OTHER): Payer: Medicare Other

## 2014-03-14 DIAGNOSIS — D509 Iron deficiency anemia, unspecified: Secondary | ICD-10-CM

## 2014-03-14 LAB — CBC WITH DIFFERENTIAL/PLATELET
Basophils Absolute: 0 10*3/uL (ref 0.0–0.1)
Basophils Relative: 0.6 % (ref 0.0–3.0)
Eosinophils Absolute: 0.2 10*3/uL (ref 0.0–0.7)
Eosinophils Relative: 4.2 % (ref 0.0–5.0)
HCT: 30.5 % — ABNORMAL LOW (ref 39.0–52.0)
Hemoglobin: 9.6 g/dL — ABNORMAL LOW (ref 13.0–17.0)
Lymphocytes Relative: 30.3 % (ref 12.0–46.0)
Lymphs Abs: 1.4 10*3/uL (ref 0.7–4.0)
MCHC: 31.4 g/dL (ref 30.0–36.0)
MCV: 74.1 fl — ABNORMAL LOW (ref 78.0–100.0)
Monocytes Absolute: 0.6 10*3/uL (ref 0.1–1.0)
Monocytes Relative: 11.8 % (ref 3.0–12.0)
Neutro Abs: 2.5 10*3/uL (ref 1.4–7.7)
Neutrophils Relative %: 53.1 % (ref 43.0–77.0)
Platelets: 286 10*3/uL (ref 150.0–400.0)
RBC: 4.11 Mil/uL — ABNORMAL LOW (ref 4.22–5.81)
RDW: 23.7 % — ABNORMAL HIGH (ref 11.5–15.5)
WBC: 4.7 10*3/uL (ref 4.0–10.5)

## 2014-03-14 NOTE — Telephone Encounter (Signed)
Pt would like detailed results sent to mychart. Pt states he had lab orders completed last visit and never received his results. Please contact pt when request is reviewed.

## 2014-03-14 NOTE — Telephone Encounter (Signed)
They should be in Thx

## 2014-03-15 ENCOUNTER — Other Ambulatory Visit: Payer: Self-pay | Admitting: *Deleted

## 2014-03-15 DIAGNOSIS — D500 Iron deficiency anemia secondary to blood loss (chronic): Secondary | ICD-10-CM

## 2014-03-16 ENCOUNTER — Telehealth: Payer: Self-pay | Admitting: *Deleted

## 2014-03-16 NOTE — Telephone Encounter (Signed)
Called patient and informed him that Dr. Henrene Pastor does want him to hold iron pill until after SBCE. Patient and wife came on 03/15/14 for SBCE endoscopy teaching. Verbal and written instructions given.

## 2014-03-20 ENCOUNTER — Ambulatory Visit (INDEPENDENT_AMBULATORY_CARE_PROVIDER_SITE_OTHER): Payer: Medicare Other | Admitting: Internal Medicine

## 2014-03-20 DIAGNOSIS — D500 Iron deficiency anemia secondary to blood loss (chronic): Secondary | ICD-10-CM

## 2014-03-20 NOTE — Progress Notes (Signed)
Patient arrived for capsule endoscopy. Prep was completed last night. Patient has not eaten. Patient and wife given verbal and written instructions for today. Patient swallowed capsule without difficulty. Lot [id number] 10 expires- 2016-07.

## 2014-03-22 ENCOUNTER — Other Ambulatory Visit: Payer: Self-pay | Admitting: Internal Medicine

## 2014-03-23 ENCOUNTER — Other Ambulatory Visit (INDEPENDENT_AMBULATORY_CARE_PROVIDER_SITE_OTHER): Payer: Medicare Other

## 2014-03-23 DIAGNOSIS — D509 Iron deficiency anemia, unspecified: Secondary | ICD-10-CM

## 2014-03-23 LAB — CBC WITH DIFFERENTIAL/PLATELET
Basophils Absolute: 0.1 10*3/uL (ref 0.0–0.1)
Basophils Relative: 1 % (ref 0.0–3.0)
Eosinophils Absolute: 0.2 10*3/uL (ref 0.0–0.7)
Eosinophils Relative: 4.1 % (ref 0.0–5.0)
HCT: 30.8 % — ABNORMAL LOW (ref 39.0–52.0)
Hemoglobin: 9.6 g/dL — ABNORMAL LOW (ref 13.0–17.0)
Lymphocytes Relative: 30 % (ref 12.0–46.0)
Lymphs Abs: 1.5 10*3/uL (ref 0.7–4.0)
MCHC: 31 g/dL (ref 30.0–36.0)
MCV: 73.3 fl — ABNORMAL LOW (ref 78.0–100.0)
Monocytes Absolute: 0.7 10*3/uL (ref 0.1–1.0)
Monocytes Relative: 14.9 % — ABNORMAL HIGH (ref 3.0–12.0)
Neutro Abs: 2.5 10*3/uL (ref 1.4–7.7)
Neutrophils Relative %: 50 % (ref 43.0–77.0)
Platelets: 291 10*3/uL (ref 150.0–400.0)
RBC: 4.21 Mil/uL — ABNORMAL LOW (ref 4.22–5.81)
RDW: 23.9 % — ABNORMAL HIGH (ref 11.5–15.5)
WBC: 5 10*3/uL (ref 4.0–10.5)

## 2014-03-25 DIAGNOSIS — E538 Deficiency of other specified B group vitamins: Secondary | ICD-10-CM

## 2014-03-25 DIAGNOSIS — I10 Essential (primary) hypertension: Secondary | ICD-10-CM

## 2014-03-25 DIAGNOSIS — D509 Iron deficiency anemia, unspecified: Secondary | ICD-10-CM

## 2014-03-25 DIAGNOSIS — I2510 Atherosclerotic heart disease of native coronary artery without angina pectoris: Secondary | ICD-10-CM

## 2014-03-25 DIAGNOSIS — F4321 Adjustment disorder with depressed mood: Secondary | ICD-10-CM

## 2014-03-27 ENCOUNTER — Telehealth: Payer: Self-pay | Admitting: *Deleted

## 2014-03-27 ENCOUNTER — Ambulatory Visit (INDEPENDENT_AMBULATORY_CARE_PROVIDER_SITE_OTHER)
Admission: RE | Admit: 2014-03-27 | Discharge: 2014-03-27 | Disposition: A | Discharge status: Home / Self Care | Payer: Medicare Other | Source: Ambulatory Visit | Attending: Physician Assistant | Admitting: Physician Assistant

## 2014-03-27 DIAGNOSIS — T184XXD Foreign body in colon, subsequent encounter: Secondary | ICD-10-CM

## 2014-03-27 DIAGNOSIS — Z5189 Encounter for other specified aftercare: Secondary | ICD-10-CM

## 2014-03-27 NOTE — Telephone Encounter (Signed)
ok 

## 2014-03-27 NOTE — Telephone Encounter (Signed)
Patient has not seen capsule pass. KUB ordered.

## 2014-03-27 NOTE — Telephone Encounter (Signed)
Message copied by Hulan Saas on Mon Mar 27, 2014  8:42 AM ------      Message from: Hulan Saas      Created: Tue Mar 21, 2014  7:56 AM       Did patient pass capsule? ------

## 2014-03-28 ENCOUNTER — Ambulatory Visit (INDEPENDENT_AMBULATORY_CARE_PROVIDER_SITE_OTHER): Payer: Medicare Other | Admitting: Internal Medicine

## 2014-03-28 ENCOUNTER — Telehealth: Payer: Self-pay

## 2014-03-28 ENCOUNTER — Encounter: Payer: Self-pay | Admitting: Internal Medicine

## 2014-03-28 VITALS — BP 120/60 | HR 80 | Temp 98.2°F | Resp 15 | Ht 73.0 in | Wt 234.0 lb

## 2014-03-28 DIAGNOSIS — Z0000 Encounter for general adult medical examination without abnormal findings: Secondary | ICD-10-CM

## 2014-03-28 DIAGNOSIS — D509 Iron deficiency anemia, unspecified: Secondary | ICD-10-CM

## 2014-03-28 NOTE — Telephone Encounter (Signed)
Spoke with pt and he is aware. Order in epic. 

## 2014-03-28 NOTE — Progress Notes (Signed)
Subjective:    [name] is a 73 y.o. male who presents for Medicare Annual/Subsequent preventive examination.   Preventive Screening-Counseling & Management  Tobacco History  Smoking status  . Former Smoker  . Quit date: 01/08/1987  Smokeless tobacco  . Never Used    Problems Prior to Visit 1.   Current Problems (verified) Patient Active Problem List   Diagnosis Date Noted  . Heme positive stool 02/22/2014  . BPH (benign prostatic hypertrophy) 02/22/2014  . Microcytic anemia 02/21/2014  . B12 deficiency 02/21/2014  . Anemia, iron deficiency 02/21/2014  . Leg weakness, bilateral 01/24/2014  . Erectile dysfunction 01/24/2014  . Edema 01/24/2014  . URI, acute 08/31/2013  . Cellulitis of arm, right 07/13/2013  . Grief 07/13/2013  . Well adult exam 03/01/2012  . CARPAL TUNNEL SYNDROME 11/04/2010  . BENIGN PROSTATIC HYPERTROPHY, WITH URINARY OBSTRUCTION 11/04/2010  . WRIST PAIN 11/04/2010  . PARESTHESIA 11/04/2010  . LEG PAIN 07/16/2010  . COUGH 05/20/2010  . TOBACCO USE, QUIT 07/04/2009  . WARTS, VIRAL, UNSPECIFIED 11/06/2008  . INSOMNIA, PERSISTENT 01/14/2008  . HYPERTENSION 01/14/2008  . RHINITIS 01/14/2008  . ABDOMINAL PAIN 01/14/2008  . TREMOR 09/07/2007  . Actinic keratosis 08/12/2007  . Neoplasm of Uncertain Behavior of Skin 07/06/2007  . LOW BACK PAIN 07/06/2007  . HYPERLIPIDEMIA 06/05/2007  . CORONARY ARTERY DISEASE 06/05/2007  . OSTEOARTHRITIS 06/05/2007    Medications Prior to Visit Current Outpatient Prescriptions on File Prior to Visit  Medication Sig Dispense Refill  . aspirin 81 MG chewable tablet Chew 81 mg by mouth at bedtime.      . Avanafil (STENDRA) 100 MG TABS Take 100 mg by mouth daily as needed (erectile dysfunction).      . Cyanocobalamin (VITAMIN B-12) 1000 MCG SUBL Place 1 tablet (1,000 mcg total) under the tongue daily.  100 tablet  3  . DULoxetine (CYMBALTA) 60 MG capsule Take 60 mg by mouth at bedtime.      . folic acid  (V-R FOLIC ACID) 149 MCG tablet Take 1 tablet (400 mcg total) by mouth daily.  30 tablet  5  . furosemide (LASIX) 20 MG tablet Take 1-2 tablets (20-40 mg total) by mouth daily as needed for edema.  60 tablet  5  . iron polysaccharides (NIFEREX) 150 MG capsule Take 1 capsule (150 mg total) by mouth daily.  30 capsule  5  . naproxen sodium (ANAPROX) 220 MG tablet Take 220 mg by mouth daily as needed (pain).      . simvastatin (ZOCOR) 40 MG tablet Take 40 mg by mouth at bedtime.      . tamsulosin (FLOMAX) 0.4 MG CAPS capsule TAKE ONE CAPSULE BY MOUTH EVERY DAY  90 capsule  1   No current facility-administered medications on file prior to visit.    Current Medications (verified) Current Outpatient Prescriptions  Medication Sig Dispense Refill  . aspirin 81 MG chewable tablet Chew 81 mg by mouth at bedtime.      . Avanafil (STENDRA) 100 MG TABS Take 100 mg by mouth daily as needed (erectile dysfunction).      . Cyanocobalamin (VITAMIN B-12) 1000 MCG SUBL Place 1 tablet (1,000 mcg total) under the tongue daily.  100 tablet  3  . DULoxetine (CYMBALTA) 60 MG capsule Take 60 mg by mouth at bedtime.      . folic acid (V-R FOLIC ACID) 702 MCG tablet Take 1 tablet (400 mcg total) by mouth daily.  30 tablet  5  . furosemide (LASIX)  20 MG tablet Take 1-2 tablets (20-40 mg total) by mouth daily as needed for edema.  60 tablet  5  . iron polysaccharides (NIFEREX) 150 MG capsule Take 1 capsule (150 mg total) by mouth daily.  30 capsule  5  . naproxen sodium (ANAPROX) 220 MG tablet Take 220 mg by mouth daily as needed (pain).      . simvastatin (ZOCOR) 40 MG tablet Take 40 mg by mouth at bedtime.      . tamsulosin (FLOMAX) 0.4 MG CAPS capsule TAKE ONE CAPSULE BY MOUTH EVERY DAY  90 capsule  1   No current facility-administered medications for this visit.     Allergies (verified) Review of patient's allergies indicates no known allergies.   PAST HISTORY  Family History Family History  Problem Relation  Age of Onset  . Aneurysm Father     AAA  . Heart disease Father   . Cancer Brother   . Mental illness Brother     dementia  . Stomach cancer Brother   . Coronary artery disease Other   . Breast cancer Other   . Cancer Mother 43    breast cancer  . Heart disease Mother     MI  . Colon cancer Neg Hx   . Esophageal cancer Neg Hx   . Pancreatic cancer Neg Hx   . Prostate cancer Neg Hx   . Rectal cancer Neg Hx     Social History History  Substance Use Topics  . Smoking status: Former Smoker    Quit date: 01/08/1987  . Smokeless tobacco: Never Used  . Alcohol Use: Yes     Comment: rare    Are there smokers in your home (other than you)?  No  Risk Factors Current exercise habits: Home exercise routine includes walking.  Dietary issues discussed: none   Cardiac risk factors: advanced age (older than 32 for men, 62 for women), dyslipidemia, hypertension and male gender.  Depression Screen (Note: if answer to either of the following is "Yes", a more complete depression screening is indicated)   Q1: Over the past two weeks, have you felt down, depressed or hopeless? No  Q2: Over the past two weeks, have you felt little interest or pleasure in doing things? No  Have you lost interest or pleasure in daily life? No  Do you often feel hopeless? No  Do you cry easily over simple problems? No  Activities of Daily Living In your present state of health, do you have any difficulty performing the following activities?:  Driving? No Managing money?  No Feeding yourself? No Getting from bed to chair? No Climbing a flight of stairs? No Preparing food and eating?: No Bathing or showering? No Getting dressed: No Getting to the toilet? No Using the toilet:No Moving around from place to place: No In the past year have you fallen or had a near fall?:No   Are you sexually active?  No  Do you have more than one partner?  No  Hearing Difficulties: No Do you often ask people to  speak up or repeat themselves? No Do you experience ringing or noises in your ears? No Do you have difficulty understanding soft or whispered voices? No   Do you feel that you have a problem with memory? No  Do you often misplace items? No  Do you feel safe at home?  Yes  Cognitive Testing  Alert? Yes  Normal Appearance?Yes  Oriented to person? Yes  Place? Yes   Time?  Yes  Recall of three objects?  Yes  Can perform simple calculations? Yes  Displays appropriate judgment?Yes  Can read the correct time from a watch face?Yes   Advanced Directives have been discussed with the patient? Yes   List the Names of Other Physician/Practitioners you currently use: 1.    Indicate any recent Medical Services you may have received from other than [facility] providers in the past year (date may be approximate).  Immunization History  Administered Date(s) Administered  . H1N1 08/02/2008  . Influenza Whole 06/10/2006, 06/15/2008, 05/22/2009, 05/25/2010, 05/26/2011, 04/25/2012  . Influenza,inj,Quad PF,36+ Mos 05/23/2013  . Pneumococcal Polysaccharide-23 06/25/2006  . Td 07/04/2009  . Tdap 06/04/2013  . Zoster 12/24/2006    Screening Tests Health Maintenance  Topic Date Due  . Influenza Vaccine  03/25/2014  . Tetanus/tdap  06/05/2023  . Colonoscopy  03/06/2024  . Pneumococcal Polysaccharide Vaccine Age 27 And Over  Completed  . Zostavax  Completed    All answers were reviewed with the patient and necessary referrals were made:  Georgetta Haber, MD   03/28/2014   History reviewed: allergies, current medications, past family history, past medical history, past social history, past surgical history and problem list  Review of Systems Pertinent items are noted in HPI.    Objective:     Vision by Snellen chart: right eye:20/20, left eye:20/20 Blood pressure 120/60, pulse 80, temperature 98.2 F (36.8 C), temperature source Oral, resp. rate 15, height 6\' 1"  (1.854 m), weight 234 lb  (106.142 kg), SpO2 98.00%. Body mass index is 30.88 kg/(m^2).  No exam performed today, MWV.     Assessment:     Patient presents for yearly preventative medicine examination. Medicare questionnaire was completed  All immunizations and health maintenance protocols were reviewed with the patient and needed orders were placed.  Appropriate screening laboratory values were ordered for the patient including screening of hyperlipidemia, renal function and hepatic function. If indicated by BPH, a PSA was ordered.  Medication reconciliation,  past medical history, social history, problem list and allergies were reviewed in detail with the patient  Goals were established with regard to weight loss, exercise, and  diet in compliance with medications  End of life planning was discussed.       Plan:     During the course of the visit the patient was educated and counseled about appropriate screening and preventive services including:    Influenza vaccine  Td vaccine  Diet review for nutrition referral? Yes ____  Not Indicated ____   Patient Instructions (the written plan) was given to the patient.  Medicare Attestation I have personally reviewed: The patient's medical and social history Their use of alcohol, tobacco or illicit drugs Their current medications and supplements The patient's functional ability including ADLs,fall risks, home safety risks, cognitive, and hearing and visual impairment Diet and physical activities Evidence for depression or mood disorders  The patient's weight, height, BMI, and visual acuity have been recorded in the chart.  I have made referrals, counseling, and provided education to the patient based on review of the above and I have provided the patient with a written personalized care plan for preventive services.     Georgetta Haber, MD   03/28/2014        Subjective:    [name] is a 73 y.o. male who presents for Medicare  Annual/Subsequent preventive examination.   Preventive Screening-Counseling & Management  Tobacco History  Smoking status  . Former Smoker  . Quit  date: 01/08/1987  Smokeless tobacco  . Never Used    Problems Prior to Visit 1.   Current Problems (verified) Patient Active Problem List   Diagnosis Date Noted  . Heme positive stool 02/22/2014  . BPH (benign prostatic hypertrophy) 02/22/2014  . Microcytic anemia 02/21/2014  . B12 deficiency 02/21/2014  . Anemia, iron deficiency 02/21/2014  . Leg weakness, bilateral 01/24/2014  . Erectile dysfunction 01/24/2014  . Edema 01/24/2014  . URI, acute 08/31/2013  . Cellulitis of arm, right 07/13/2013  . Grief 07/13/2013  . Well adult exam 03/01/2012  . CARPAL TUNNEL SYNDROME 11/04/2010  . BENIGN PROSTATIC HYPERTROPHY, WITH URINARY OBSTRUCTION 11/04/2010  . WRIST PAIN 11/04/2010  . PARESTHESIA 11/04/2010  . LEG PAIN 07/16/2010  . COUGH 05/20/2010  . TOBACCO USE, QUIT 07/04/2009  . WARTS, VIRAL, UNSPECIFIED 11/06/2008  . INSOMNIA, PERSISTENT 01/14/2008  . HYPERTENSION 01/14/2008  . RHINITIS 01/14/2008  . ABDOMINAL PAIN 01/14/2008  . TREMOR 09/07/2007  . Actinic keratosis 08/12/2007  . Neoplasm of Uncertain Behavior of Skin 07/06/2007  . LOW BACK PAIN 07/06/2007  . HYPERLIPIDEMIA 06/05/2007  . CORONARY ARTERY DISEASE 06/05/2007  . OSTEOARTHRITIS 06/05/2007    Medications Prior to Visit Current Outpatient Prescriptions on File Prior to Visit  Medication Sig Dispense Refill  . aspirin 81 MG chewable tablet Chew 81 mg by mouth at bedtime.      . Avanafil (STENDRA) 100 MG TABS Take 100 mg by mouth daily as needed (erectile dysfunction).      . Cyanocobalamin (VITAMIN B-12) 1000 MCG SUBL Place 1 tablet (1,000 mcg total) under the tongue daily.  100 tablet  3  . DULoxetine (CYMBALTA) 60 MG capsule Take 60 mg by mouth at bedtime.      . folic acid (V-R FOLIC ACID) 811 MCG tablet Take 1 tablet (400 mcg total) by mouth daily.  30  tablet  5  . furosemide (LASIX) 20 MG tablet Take 1-2 tablets (20-40 mg total) by mouth daily as needed for edema.  60 tablet  5  . iron polysaccharides (NIFEREX) 150 MG capsule Take 1 capsule (150 mg total) by mouth daily.  30 capsule  5  . naproxen sodium (ANAPROX) 220 MG tablet Take 220 mg by mouth daily as needed (pain).      . simvastatin (ZOCOR) 40 MG tablet Take 40 mg by mouth at bedtime.      . tamsulosin (FLOMAX) 0.4 MG CAPS capsule TAKE ONE CAPSULE BY MOUTH EVERY DAY  90 capsule  1   No current facility-administered medications on file prior to visit.    Current Medications (verified) Current Outpatient Prescriptions  Medication Sig Dispense Refill  . aspirin 81 MG chewable tablet Chew 81 mg by mouth at bedtime.      . Avanafil (STENDRA) 100 MG TABS Take 100 mg by mouth daily as needed (erectile dysfunction).      . Cyanocobalamin (VITAMIN B-12) 1000 MCG SUBL Place 1 tablet (1,000 mcg total) under the tongue daily.  100 tablet  3  . DULoxetine (CYMBALTA) 60 MG capsule Take 60 mg by mouth at bedtime.      . folic acid (V-R FOLIC ACID) 031 MCG tablet Take 1 tablet (400 mcg total) by mouth daily.  30 tablet  5  . furosemide (LASIX) 20 MG tablet Take 1-2 tablets (20-40 mg total) by mouth daily as needed for edema.  60 tablet  5  . iron polysaccharides (NIFEREX) 150 MG capsule Take 1 capsule (150 mg total) by mouth daily.  30 capsule  5  . naproxen sodium (ANAPROX) 220 MG tablet Take 220 mg by mouth daily as needed (pain).      . simvastatin (ZOCOR) 40 MG tablet Take 40 mg by mouth at bedtime.      . tamsulosin (FLOMAX) 0.4 MG CAPS capsule TAKE ONE CAPSULE BY MOUTH EVERY DAY  90 capsule  1   No current facility-administered medications for this visit.     Allergies (verified) Review of patient's allergies indicates no known allergies.   PAST HISTORY  Family History Family History  Problem Relation Age of Onset  . Aneurysm Father     AAA  . Heart disease Father   . Cancer  Brother   . Mental illness Brother     dementia  . Stomach cancer Brother   . Coronary artery disease Other   . Breast cancer Other   . Cancer Mother 24    breast cancer  . Heart disease Mother     MI  . Colon cancer Neg Hx   . Esophageal cancer Neg Hx   . Pancreatic cancer Neg Hx   . Prostate cancer Neg Hx   . Rectal cancer Neg Hx     Social History History  Substance Use Topics  . Smoking status: Former Smoker    Quit date: 01/08/1987  . Smokeless tobacco: Never Used  . Alcohol Use: Yes     Comment: rare    Are there smokers in your home (other than you)?  No  Risk Factors Current exercise habits: The patient does not participate in regular exercise at present.  Dietary issues discussed: none   Cardiac risk factors: advanced age (older than 15 for men, 70 for women), dyslipidemia, hypertension and male gender.  Depression Screen (Note: if answer to either of the following is "Yes", a more complete depression screening is indicated)   Q1: Over the past two weeks, have you felt down, depressed or hopeless? No  Q2: Over the past two weeks, have you felt little interest or pleasure in doing things? No  Have you lost interest or pleasure in daily life? No  Do you often feel hopeless? No  Do you cry easily over simple problems? No  Activities of Daily Living In your present state of health, do you have any difficulty performing the following activities?:  Driving? Yes Managing money?  No Feeding yourself? No Getting from bed to chair? No Climbing a flight of stairs? No Preparing food and eating?: No Bathing or showering? No Getting dressed: No Getting to the toilet? No Using the toilet:No Moving around from place to place: No In the past year have you fallen or had a near fall?:No   Are you sexually active?  No  Do you have more than one partner?  No  Hearing Difficulties: No Do you often ask people to speak up or repeat themselves? No Do you experience  ringing or noises in your ears? No Do you have difficulty understanding soft or whispered voices? No   Do you feel that you have a problem with memory? No  Do you often misplace items? No  Do you feel safe at home?  Yes  Cognitive Testing  Alert? Yes  Normal Appearance?Yes  Oriented to person? Yes  Place? Yes   Time? Yes  Recall of three objects?  Yes  Can perform simple calculations? Yes  Displays appropriate judgment?Yes  Can read the correct time from a watch face?Yes   Advanced Directives have been  discussed with the patient? Yes   List the Names of Other Physician/Practitioners you currently use: 1.    Indicate any recent Medical Services you may have received from other than [facility] providers in the past year (date may be approximate).  Immunization History  Administered Date(s) Administered  . H1N1 08/02/2008  . Influenza Whole 06/10/2006, 06/15/2008, 05/22/2009, 05/25/2010, 05/26/2011, 04/25/2012  . Influenza,inj,Quad PF,36+ Mos 05/23/2013  . Pneumococcal Polysaccharide-23 06/25/2006  . Td 07/04/2009  . Tdap 06/04/2013  . Zoster 12/24/2006    Screening Tests Health Maintenance  Topic Date Due  . Influenza Vaccine  03/25/2014  . Tetanus/tdap  06/05/2023  . Colonoscopy  03/06/2024  . Pneumococcal Polysaccharide Vaccine Age 9 And Over  Completed  . Zostavax  Completed    All answers were reviewed with the patient and necessary referrals were made:  Georgetta Haber, MD   03/28/2014   History reviewed: allergies, current medications, past family history, past medical history, past social history, past surgical history and problem list  Review of Systems Pertinent items are noted in HPI.    Objective:     Vision by Snellen chart: right eye:20/20, left eye:20/20 Blood pressure 120/60, pulse 80, temperature 98.2 F (36.8 C), temperature source Oral, resp. rate 15, height 6\' 1"  (1.854 m), weight 234 lb (106.142 kg), SpO2 98.00%. Body mass index is 30.88  kg/(m^2).  No exam performed today, MWV.     Assessment:     Patient presents for yearly preventative medicine examination. Medicare questionnaire was completed  All immunizations and health maintenance protocols were reviewed with the patient and needed orders were placed.  Appropriate screening laboratory values were ordered for the patient including screening of hyperlipidemia, renal function and hepatic function. If indicated by BPH, a PSA was ordered.  Medication reconciliation,  past medical history, social history, problem list and allergies were reviewed in detail with the patient  Goals were established with regard to weight loss, exercise, and  diet in compliance with medications  End of life planning was discussed.       Plan:     During the course of the visit the patient was educated and counseled about appropriate screening and preventive services including:   Diet review for nutrition referral? Yes ____  Not Indicated ____   Patient Instructions (the written plan) was given to the patient.  Medicare Attestation I have personally reviewed: The patient's medical and social history Their use of alcohol, tobacco or illicit drugs Their current medications and supplements The patient's functional ability including ADLs,fall risks, home safety risks, cognitive, and hearing and visual impairment Diet and physical activities Evidence for depression or mood disorders  The patient's weight, height, BMI, and visual acuity have been recorded in the chart.  I have made referrals, counseling, and provided education to the patient based on review of the above and I have provided the patient with a written personalized care plan for preventive services.     Georgetta Haber, MD   03/28/2014

## 2014-03-28 NOTE — Progress Notes (Signed)
Subjective:    [name] is a 73 y.o. male who presents for Medicare Annual/Subsequent preventive examination.   Preventive Screening-Counseling & Management  Tobacco History  Smoking status  . Former Smoker  . Quit date: 01/08/1987  Smokeless tobacco  . Never Used    Problems Prior to Visit 1.   Current Problems (verified) Patient Active Problem List   Diagnosis Date Noted  . Heme positive stool 02/22/2014  . BPH (benign prostatic hypertrophy) 02/22/2014  . Microcytic anemia 02/21/2014  . B12 deficiency 02/21/2014  . Anemia, iron deficiency 02/21/2014  . Leg weakness, bilateral 01/24/2014  . Erectile dysfunction 01/24/2014  . Edema 01/24/2014  . URI, acute 08/31/2013  . Cellulitis of arm, right 07/13/2013  . Grief 07/13/2013  . Well adult exam 03/01/2012  . CARPAL TUNNEL SYNDROME 11/04/2010  . BENIGN PROSTATIC HYPERTROPHY, WITH URINARY OBSTRUCTION 11/04/2010  . WRIST PAIN 11/04/2010  . PARESTHESIA 11/04/2010  . LEG PAIN 07/16/2010  . COUGH 05/20/2010  . TOBACCO USE, QUIT 07/04/2009  . WARTS, VIRAL, UNSPECIFIED 11/06/2008  . INSOMNIA, PERSISTENT 01/14/2008  . HYPERTENSION 01/14/2008  . RHINITIS 01/14/2008  . ABDOMINAL PAIN 01/14/2008  . TREMOR 09/07/2007  . Actinic keratosis 08/12/2007  . Neoplasm of Uncertain Behavior of Skin 07/06/2007  . LOW BACK PAIN 07/06/2007  . HYPERLIPIDEMIA 06/05/2007  . CORONARY ARTERY DISEASE 06/05/2007  . OSTEOARTHRITIS 06/05/2007    Medications Prior to Visit Current Outpatient Prescriptions on File Prior to Visit  Medication Sig Dispense Refill  . aspirin 81 MG chewable tablet Chew 81 mg by mouth at bedtime.      . Avanafil (STENDRA) 100 MG TABS Take 100 mg by mouth daily as needed (erectile dysfunction).      . Cyanocobalamin (VITAMIN B-12) 1000 MCG SUBL Place 1 tablet (1,000 mcg total) under the tongue daily.  100 tablet  3  . DULoxetine (CYMBALTA) 60 MG capsule Take 60 mg by mouth at bedtime.      . folic acid  (V-R FOLIC ACID) 009 MCG tablet Take 1 tablet (400 mcg total) by mouth daily.  30 tablet  5  . furosemide (LASIX) 20 MG tablet Take 1-2 tablets (20-40 mg total) by mouth daily as needed for edema.  60 tablet  5  . iron polysaccharides (NIFEREX) 150 MG capsule Take 1 capsule (150 mg total) by mouth daily.  30 capsule  5  . naproxen sodium (ANAPROX) 220 MG tablet Take 220 mg by mouth daily as needed (pain).      . simvastatin (ZOCOR) 40 MG tablet Take 40 mg by mouth at bedtime.      . tamsulosin (FLOMAX) 0.4 MG CAPS capsule TAKE ONE CAPSULE BY MOUTH EVERY DAY  90 capsule  1   No current facility-administered medications on file prior to visit.    Current Medications (verified) Current Outpatient Prescriptions  Medication Sig Dispense Refill  . aspirin 81 MG chewable tablet Chew 81 mg by mouth at bedtime.      . Avanafil (STENDRA) 100 MG TABS Take 100 mg by mouth daily as needed (erectile dysfunction).      . Cyanocobalamin (VITAMIN B-12) 1000 MCG SUBL Place 1 tablet (1,000 mcg total) under the tongue daily.  100 tablet  3  . DULoxetine (CYMBALTA) 60 MG capsule Take 60 mg by mouth at bedtime.      . folic acid (V-R FOLIC ACID) 381 MCG tablet Take 1 tablet (400 mcg total) by mouth daily.  30 tablet  5  . furosemide (LASIX)  20 MG tablet Take 1-2 tablets (20-40 mg total) by mouth daily as needed for edema.  60 tablet  5  . iron polysaccharides (NIFEREX) 150 MG capsule Take 1 capsule (150 mg total) by mouth daily.  30 capsule  5  . naproxen sodium (ANAPROX) 220 MG tablet Take 220 mg by mouth daily as needed (pain).      . simvastatin (ZOCOR) 40 MG tablet Take 40 mg by mouth at bedtime.      . tamsulosin (FLOMAX) 0.4 MG CAPS capsule TAKE ONE CAPSULE BY MOUTH EVERY DAY  90 capsule  1   No current facility-administered medications for this visit.     Allergies (verified) Review of patient's allergies indicates no known allergies.   PAST HISTORY  Family History Family History  Problem Relation  Age of Onset  . Aneurysm Father     AAA  . Heart disease Father   . Cancer Brother   . Mental illness Brother     dementia  . Stomach cancer Brother   . Coronary artery disease Other   . Breast cancer Other   . Cancer Mother 66    breast cancer  . Heart disease Mother     MI  . Colon cancer Neg Hx   . Esophageal cancer Neg Hx   . Pancreatic cancer Neg Hx   . Prostate cancer Neg Hx   . Rectal cancer Neg Hx     Social History History  Substance Use Topics  . Smoking status: Former Smoker    Quit date: 01/08/1987  . Smokeless tobacco: Never Used  . Alcohol Use: Yes     Comment: rare    Are there smokers in your home (other than you)?  No  Risk Factors Current exercise habits: Home exercise routine includes bike riding. several days a week  Dietary issues discussed: Eats fair.  Cardiac risk factors: advanced age (older than 27 for men, 71 for women), dyslipidemia, hypertension, male gender and obesity (BMI >= 30 kg/m2).  Depression Screen (Note: if answer to either of the following is "Yes", a more complete depression screening is indicated)   Q1: Over the past two weeks, have you felt down, depressed or hopeless? No  Q2: Over the past two weeks, have you felt little interest or pleasure in doing things? No  Have you lost interest or pleasure in daily life? No  Do you often feel hopeless? No  Do you cry easily over simple problems? Yes  Activities of Daily Living In your present state of health, do you have any difficulty performing the following activities?:  Driving? No Managing money?  No Feeding yourself? Yes Getting from bed to chair? No Climbing a flight of stairs? No Preparing food and eating?: No Bathing or showering? No Getting dressed: No Getting to the toilet? No Using the toilet:No Moving around from place to place: No In the past year have you fallen or had a near fall?:No   Are you sexually active?  No  Do you have more than one partner?   No  Hearing Difficulties: No Do you often ask people to speak up or repeat themselves? No Do you experience ringing or noises in your ears? No Do you have difficulty understanding soft or whispered voices? No   Do you feel that you have a problem with memory? No  Do you often misplace items? No  Do you feel safe at home?  yes  Cognitive Testing  Alert? Yes  Normal Appearance?Yes  Oriented to person? Yes  Place? Yes   Time? Yes  Recall of three objects?  Yes  Can perform simple calculations? Yes  Displays appropriate judgment?Yes  Can read the correct time from a watch face?Yes   Advanced Directives have been discussed with the patient? Yes   List the Names of Other Physician/Practitioners you currently use: 1.  Dr. Henrene Pastor, Dr. Telford Nab, Dr. Donald Pore, Dr. Fredna Dow,  Indicate any recent Medical Services you may have received from other than [facility] providers in the past year (date may be approximate).  Immunization History  Administered Date(s) Administered  . H1N1 08/02/2008  . Influenza Whole 06/10/2006, 06/15/2008, 05/22/2009, 05/25/2010, 05/26/2011, 04/25/2012  . Influenza,inj,Quad PF,36+ Mos 05/23/2013  . Pneumococcal Polysaccharide-23 06/25/2006  . Td 07/04/2009  . Tdap 06/04/2013  . Zoster 12/24/2006    Screening Tests Health Maintenance  Topic Date Due  . Influenza Vaccine  03/25/2014  . Tetanus/tdap  06/05/2023  . Colonoscopy  03/06/2024  . Pneumococcal Polysaccharide Vaccine Age 75 And Over  Completed  . Zostavax  Completed    All answers were reviewed with the patient and necessary referrals were made:  Georgetta Haber, MD   03/28/2014   History reviewed: allergies, current medications, past family history, past medical history, past social history, past surgical history and problem list  Review of Systems Pertinent items are noted in HPI.    Objective:     Vision by Snellen chart: right eye:20/20, left eye:20/20(Wears reading glasses) Blood pressure  120/60, pulse 80, temperature 98.2 F (36.8 C), temperature source Oral, resp. rate 15, height 6\' 1"  (1.854 m), weight 234 lb (106.142 kg), SpO2 98.00%. Body mass index is 30.88 kg/(m^2).  No exam performed today, medicare wellness.     Assessment:     Patient presents for yearly preventative medicine examination. Medicare questionnaire was completed  All immunizations and health maintenance protocols were reviewed with the patient and needed orders were placed.  Appropriate screening laboratory values were ordered for the patient including screening of hyperlipidemia, renal function and hepatic function. If indicated by BPH, a PSA was ordered.  Medication reconciliation,  past medical history, social history, problem list and allergies were reviewed in detail with the patient  Goals were established with regard to weight loss, exercise, and  diet in compliance with medications  End of life planning was discussed.      Plan:     During the course of the visit the patient was educated and counseled about appropriate screening and preventive services including:    Influenza vaccine  Diet review for nutrition referral? Yes ____  Not Indicated __x__   Patient Instructions (the written plan) was given to the patient.  Medicare Attestation I have personally reviewed: The patient's medical and social history Their use of alcohol, tobacco or illicit drugs Their current medications and supplements The patient's functional ability including ADLs,fall risks, home safety risks, cognitive, and hearing and visual impairment Diet and physical activities Evidence for depression or mood disorders  The patient's weight, height, BMI, and visual acuity have been recorded in the chart.  I have made referrals, counseling, and provided education to the patient based on review of the above and I have provided the patient with a written personalized care plan for preventive services.      Georgetta Haber, MD   03/28/2014

## 2014-03-28 NOTE — Telephone Encounter (Signed)
Message copied by Algernon Huxley on Tue Mar 28, 2014  2:40 PM ------      Message from: Irene Shipper      Created: Tue Mar 28, 2014  2:11 PM      Regarding: Capsule endoscopy results       Please let the patient know that his capsule endoscopy demonstrated small bowel AVMs. This might be the cause for his anemia with low iron. Please ask him to increase his iron to 3 times daily and repeat CBC in 4 weeks. Thanks. Please convert to a phone note for record ------

## 2014-04-03 ENCOUNTER — Telehealth: Payer: Self-pay

## 2014-04-03 NOTE — Telephone Encounter (Signed)
Pt aware.

## 2014-04-03 NOTE — Telephone Encounter (Signed)
Message copied by Algernon Huxley on Mon Apr 03, 2014  8:24 AM ------      Message from: Yarel Kilcrease, [place] R      Created: Mon Mar 13, 2014  2:50 PM      Regarding: CBC       Pt needs CBC in 4 weeks, order in epic. ------

## 2014-04-06 ENCOUNTER — Other Ambulatory Visit (INDEPENDENT_AMBULATORY_CARE_PROVIDER_SITE_OTHER): Payer: Medicare Other

## 2014-04-06 DIAGNOSIS — D509 Iron deficiency anemia, unspecified: Secondary | ICD-10-CM

## 2014-04-06 DIAGNOSIS — I10 Essential (primary) hypertension: Secondary | ICD-10-CM

## 2014-04-06 LAB — CBC WITH DIFFERENTIAL/PLATELET
Basophils Absolute: 0 10*3/uL (ref 0.0–0.1)
Basophils Absolute: 0 10*3/uL (ref 0.0–0.1)
Basophils Relative: 0.8 % (ref 0.0–3.0)
Basophils Relative: 0.8 % (ref 0.0–3.0)
Eosinophils Absolute: 0.2 10*3/uL (ref 0.0–0.7)
Eosinophils Absolute: 0.2 10*3/uL (ref 0.0–0.7)
Eosinophils Relative: 4.4 % (ref 0.0–5.0)
Eosinophils Relative: 4.4 % (ref 0.0–5.0)
HCT: 29.4 % — ABNORMAL LOW (ref 39.0–52.0)
HCT: 29.4 % — ABNORMAL LOW (ref 39.0–52.0)
Hemoglobin: 9.2 g/dL — ABNORMAL LOW (ref 13.0–17.0)
Hemoglobin: 9.2 g/dL — ABNORMAL LOW (ref 13.0–17.0)
Lymphocytes Relative: 29.6 % (ref 12.0–46.0)
Lymphocytes Relative: 29.6 % (ref 12.0–46.0)
Lymphs Abs: 1.6 10*3/uL (ref 0.7–4.0)
Lymphs Abs: 1.6 10*3/uL (ref 0.7–4.0)
MCHC: 31.3 g/dL (ref 30.0–36.0)
MCHC: 31.3 g/dL (ref 30.0–36.0)
MCV: 75.1 fl — ABNORMAL LOW (ref 78.0–100.0)
MCV: 75.1 fl — ABNORMAL LOW (ref 78.0–100.0)
Monocytes Absolute: 1 10*3/uL (ref 0.1–1.0)
Monocytes Absolute: 1 10*3/uL (ref 0.1–1.0)
Monocytes Relative: 18.7 % — ABNORMAL HIGH (ref 3.0–12.0)
Monocytes Relative: 18.7 % — ABNORMAL HIGH (ref 3.0–12.0)
Neutro Abs: 2.5 10*3/uL (ref 1.4–7.7)
Neutro Abs: 2.5 10*3/uL (ref 1.4–7.7)
Neutrophils Relative %: 46.5 % (ref 43.0–77.0)
Neutrophils Relative %: 46.9 % (ref 43.0–77.0)
Platelets: 226 10*3/uL (ref 150.0–400.0)
Platelets: 226 10*3/uL (ref 150.0–400.0)
RBC: 3.91 Mil/uL — ABNORMAL LOW (ref 4.22–5.81)
RBC: 3.91 Mil/uL — ABNORMAL LOW (ref 4.22–5.81)
RDW: 26.2 % — ABNORMAL HIGH (ref 11.5–15.5)
RDW: 26.2 % — ABNORMAL HIGH (ref 11.5–15.5)
WBC: 5.4 10*3/uL (ref 4.0–10.5)
WBC: 5.4 10*3/uL (ref 4.0–10.5)

## 2014-04-10 ENCOUNTER — Telehealth: Payer: Self-pay

## 2014-04-10 ENCOUNTER — Other Ambulatory Visit: Payer: Self-pay

## 2014-04-10 DIAGNOSIS — D509 Iron deficiency anemia, unspecified: Secondary | ICD-10-CM

## 2014-04-10 NOTE — Telephone Encounter (Signed)
Patient advised of the results. He reports he is taking his iron supplement 3 times daily and no missed doses. Agrees to repeat CBC in 4 weeks.

## 2014-04-10 NOTE — Telephone Encounter (Signed)
Message copied by Greggory Keen on Mon Apr 10, 2014 11:20 AM ------      Message from: Irene Shipper      Created: Mon Apr 10, 2014 10:24 AM       Please let patient know that anemia is unchanged. Make sure he is taking iron 3 times daily. Repeat CBC in 4 weeks ------

## 2014-04-12 NOTE — Patient Instructions (Signed)
The patient is instructed to continue all medications as prescribed. Schedule followup with check out clerk upon leaving the clinic  

## 2014-04-13 ENCOUNTER — Ambulatory Visit (INDEPENDENT_AMBULATORY_CARE_PROVIDER_SITE_OTHER): Payer: Medicare Other | Admitting: Internal Medicine

## 2014-04-13 ENCOUNTER — Encounter: Payer: Self-pay | Admitting: Internal Medicine

## 2014-04-13 ENCOUNTER — Other Ambulatory Visit (INDEPENDENT_AMBULATORY_CARE_PROVIDER_SITE_OTHER): Payer: Medicare Other

## 2014-04-13 VITALS — BP 120/70 | HR 80 | Temp 98.0°F | Resp 16 | Wt 236.0 lb

## 2014-04-13 DIAGNOSIS — D509 Iron deficiency anemia, unspecified: Secondary | ICD-10-CM

## 2014-04-13 DIAGNOSIS — R5381 Other malaise: Secondary | ICD-10-CM

## 2014-04-13 DIAGNOSIS — E538 Deficiency of other specified B group vitamins: Secondary | ICD-10-CM

## 2014-04-13 DIAGNOSIS — I10 Essential (primary) hypertension: Secondary | ICD-10-CM

## 2014-04-13 DIAGNOSIS — Z23 Encounter for immunization: Secondary | ICD-10-CM

## 2014-04-13 DIAGNOSIS — R531 Weakness: Secondary | ICD-10-CM | POA: Insufficient documentation

## 2014-04-13 DIAGNOSIS — R609 Edema, unspecified: Secondary | ICD-10-CM

## 2014-04-13 DIAGNOSIS — R29898 Other symptoms and signs involving the musculoskeletal system: Secondary | ICD-10-CM

## 2014-04-13 DIAGNOSIS — R5383 Other fatigue: Secondary | ICD-10-CM

## 2014-04-13 LAB — CBC WITH DIFFERENTIAL/PLATELET
Basophils Absolute: 0 10*3/uL (ref 0.0–0.1)
Basophils Relative: 0.9 % (ref 0.0–3.0)
Eosinophils Absolute: 0.4 10*3/uL (ref 0.0–0.7)
Eosinophils Relative: 11 % — ABNORMAL HIGH (ref 0.0–5.0)
HCT: 31.2 % — ABNORMAL LOW (ref 39.0–52.0)
Hemoglobin: 9.8 g/dL — ABNORMAL LOW (ref 13.0–17.0)
Lymphocytes Relative: 30 % (ref 12.0–46.0)
Lymphs Abs: 1 10*3/uL (ref 0.7–4.0)
MCHC: 31.4 g/dL (ref 30.0–36.0)
MCV: 76.8 fl — ABNORMAL LOW (ref 78.0–100.0)
Monocytes Absolute: 1 10*3/uL (ref 0.1–1.0)
Monocytes Relative: 17 % — ABNORMAL HIGH (ref 3.0–12.0)
Neutro Abs: 1.4 10*3/uL (ref 1.4–7.7)
Neutrophils Relative %: 42 % — ABNORMAL LOW (ref 43.0–77.0)
Platelets: 228 10*3/uL (ref 150.0–400.0)
RBC: 4.06 Mil/uL — ABNORMAL LOW (ref 4.22–5.81)
RDW: 26.1 % — ABNORMAL HIGH (ref 11.5–15.5)
WBC: 3.9 10*3/uL — ABNORMAL LOW (ref 4.0–10.5)

## 2014-04-13 MED ORDER — POLYSACCHARIDE IRON COMPLEX 150 MG PO CAPS: 150.0000 mg | ORAL_CAPSULE | Freq: Three times a day (TID) | ORAL | Status: DC

## 2014-04-13 NOTE — Progress Notes (Signed)
Pre visit review using our clinic review tool, if applicable. No additional management support is needed unless otherwise documented below in the visit note. 

## 2014-04-13 NOTE — Assessment & Plan Note (Signed)
?  anemia related Hold Cymbalta Treat anemia

## 2014-04-13 NOTE — Assessment & Plan Note (Signed)
Diet controlled.  

## 2014-04-13 NOTE — Assessment & Plan Note (Signed)
Better - use lasix prn only

## 2014-04-13 NOTE — Assessment & Plan Note (Signed)
Continue with current prescription therapy as reflected on the Med list.  

## 2014-04-13 NOTE — Assessment & Plan Note (Signed)
On Iron

## 2014-04-13 NOTE — Patient Instructions (Signed)
Hold Cymbalta

## 2014-04-13 NOTE — Assessment & Plan Note (Signed)
better 

## 2014-04-13 NOTE — Progress Notes (Signed)
   Subjective:    HPI   F/u anemia with Hgb 7.0 prior, last Hgb - 9.2 F/u weakness in B thighs when walking x 3-4 months; they feel "tired" - better. No LBP. No leg pain. C/o fatigue... The patient presents for a follow-up of  chronic hypertension, chronic dyslipidemia, CAD controlled with medicines.     Wt Readings from Last 3 Encounters:  04/13/14 236 lb (107.049 kg)  03/28/14 234 lb (106.142 kg)  03/06/14 235 lb (106.595 kg)   BP Readings from Last 3 Encounters:  04/13/14 120/70  03/28/14 120/60  03/07/14 118/64      Review of Systems  Constitutional: Negative for appetite change, fatigue and unexpected weight change.  HENT: Negative for nosebleeds and trouble swallowing.   Eyes: Negative for itching and visual disturbance.  Respiratory: Positive for chest tightness.   Cardiovascular: Negative for palpitations and leg swelling.  Gastrointestinal: Negative for blood in stool and abdominal distention.  Genitourinary: Negative for frequency and hematuria.  Musculoskeletal: Positive for arthralgias and back pain. Negative for gait problem and joint swelling.  Neurological: Negative for dizziness, tremors, speech difficulty and weakness.  Psychiatric/Behavioral: Negative for suicidal ideas, sleep disturbance, dysphoric mood and agitation. The patient is not nervous/anxious.        Objective:   Physical Exam  Constitutional: He is oriented to person, place, and time. He appears well-developed.  HENT:  Mouth/Throat: Oropharynx is clear and moist. No oropharyngeal exudate.  eryth throat  Eyes: Conjunctivae are normal. Pupils are equal, round, and reactive to light.  Neck: Normal range of motion. No JVD present. No thyromegaly present.  Cardiovascular: Normal rate, regular rhythm, normal heart sounds and intact distal pulses.  Exam reveals no gallop and no friction rub.   No murmur heard. Pulmonary/Chest: Effort normal and breath sounds normal. No respiratory distress. He  has no wheezes. He has no rales. He exhibits no tenderness.  Abdominal: Soft. Bowel sounds are normal. He exhibits no distension and no mass. There is no tenderness. There is no rebound and no guarding.  Genitourinary: Rectum normal and prostate normal. Guaiac negative stool.  Musculoskeletal: Normal range of motion. He exhibits edema (1+) and tenderness (R radial wrist is tender).  Lymphadenopathy:    He has no cervical adenopathy.  Neurological: He is alert and oriented to person, place, and time. He has normal reflexes. No cranial nerve deficit. He exhibits normal muscle tone. Coordination normal.  Skin: Skin is warm and dry. No rash noted.  Psychiatric: He has a normal mood and affect. His behavior is normal. Judgment and thought content normal.  Tanned but pale skin - better   Lab Results  Component Value Date   WBC 5.4 04/06/2014   HGB 9.2* 04/06/2014   HCT 29.4* 04/06/2014   PLT 226.0 04/06/2014   GLUCOSE 110* 03/06/2014   CHOL 162 11/07/2013   TRIG 122.0 11/07/2013   HDL 57.30 11/07/2013   LDLDIRECT 157.5 07/05/2008   LDLCALC 80 11/07/2013   ALT 54* 03/06/2014   AST 130* 03/06/2014   NA 140 03/06/2014   K 3.9 03/06/2014   CL 103 03/06/2014   CREATININE 0.71 03/06/2014   BUN 10 03/06/2014   CO2 22 03/06/2014   TSH 1.290 02/21/2014   PSA 0.49 03/01/2012   INR 1.18 02/22/2014        Assessment & Plan:

## 2014-04-20 ENCOUNTER — Other Ambulatory Visit (INDEPENDENT_AMBULATORY_CARE_PROVIDER_SITE_OTHER): Payer: Medicare Other

## 2014-04-20 DIAGNOSIS — D509 Iron deficiency anemia, unspecified: Secondary | ICD-10-CM

## 2014-04-20 LAB — CBC WITH DIFFERENTIAL/PLATELET
Basophils Absolute: 0 10*3/uL (ref 0.0–0.1)
Basophils Absolute: 0.1 10*3/uL (ref 0.0–0.1)
Basophils Relative: 0.6 % (ref 0.0–3.0)
Basophils Relative: 0.8 % (ref 0.0–3.0)
Eosinophils Absolute: 0.2 10*3/uL (ref 0.0–0.7)
Eosinophils Absolute: 0.3 10*3/uL (ref 0.0–0.7)
Eosinophils Relative: 3.7 % (ref 0.0–5.0)
Eosinophils Relative: 3.9 % (ref 0.0–5.0)
HCT: 28.8 % — ABNORMAL LOW (ref 39.0–52.0)
HCT: 29.1 % — ABNORMAL LOW (ref 39.0–52.0)
Hemoglobin: 9.1 g/dL — ABNORMAL LOW (ref 13.0–17.0)
Hemoglobin: 9.2 g/dL — ABNORMAL LOW (ref 13.0–17.0)
Lymphocytes Relative: 25.9 % (ref 12.0–46.0)
Lymphocytes Relative: 26.4 % (ref 12.0–46.0)
Lymphs Abs: 1.7 10*3/uL (ref 0.7–4.0)
Lymphs Abs: 1.8 10*3/uL (ref 0.7–4.0)
MCHC: 31.1 g/dL (ref 30.0–36.0)
MCHC: 31.8 g/dL (ref 30.0–36.0)
MCV: 77.9 fl — ABNORMAL LOW (ref 78.0–100.0)
MCV: 78.1 fl (ref 78.0–100.0)
Monocytes Absolute: 1 10*3/uL (ref 0.1–1.0)
Monocytes Absolute: 1 10*3/uL (ref 0.1–1.0)
Monocytes Relative: 14.3 % — ABNORMAL HIGH (ref 3.0–12.0)
Monocytes Relative: 14.5 % — ABNORMAL HIGH (ref 3.0–12.0)
Neutro Abs: 3.7 10*3/uL (ref 1.4–7.7)
Neutro Abs: 3.7 10*3/uL (ref 1.4–7.7)
Neutrophils Relative %: 54.8 % (ref 43.0–77.0)
Neutrophils Relative %: 55.1 % (ref 43.0–77.0)
Platelets: 254 10*3/uL (ref 150.0–400.0)
Platelets: 257 10*3/uL (ref 150.0–400.0)
RBC: 3.69 Mil/uL — ABNORMAL LOW (ref 4.22–5.81)
RBC: 3.73 Mil/uL — ABNORMAL LOW (ref 4.22–5.81)
RDW: 25.8 % — ABNORMAL HIGH (ref 11.5–15.5)
RDW: 26.7 % — ABNORMAL HIGH (ref 11.5–15.5)
WBC: 6.7 10*3/uL (ref 4.0–10.5)
WBC: 6.7 10*3/uL (ref 4.0–10.5)

## 2014-04-21 ENCOUNTER — Other Ambulatory Visit: Payer: Self-pay

## 2014-04-21 DIAGNOSIS — D649 Anemia, unspecified: Secondary | ICD-10-CM

## 2014-04-27 ENCOUNTER — Other Ambulatory Visit (INDEPENDENT_AMBULATORY_CARE_PROVIDER_SITE_OTHER): Payer: Medicare Other

## 2014-04-27 DIAGNOSIS — D649 Anemia, unspecified: Secondary | ICD-10-CM

## 2014-04-27 LAB — CBC WITH DIFFERENTIAL/PLATELET
Basophils Absolute: 0 10*3/uL (ref 0.0–0.1)
Basophils Relative: 0.6 % (ref 0.0–3.0)
Eosinophils Absolute: 0.2 10*3/uL (ref 0.0–0.7)
Eosinophils Relative: 3.4 % (ref 0.0–5.0)
HCT: 28.3 % — ABNORMAL LOW (ref 39.0–52.0)
Hemoglobin: 9 g/dL — ABNORMAL LOW (ref 13.0–17.0)
Lymphocytes Relative: 28.9 % (ref 12.0–46.0)
Lymphs Abs: 1.3 10*3/uL (ref 0.7–4.0)
MCHC: 31.9 g/dL (ref 30.0–36.0)
MCV: 79.8 fl (ref 78.0–100.0)
Monocytes Absolute: 0.7 10*3/uL (ref 0.1–1.0)
Monocytes Relative: 16.3 % — ABNORMAL HIGH (ref 3.0–12.0)
Neutro Abs: 2.3 10*3/uL (ref 1.4–7.7)
Neutrophils Relative %: 50.8 % (ref 43.0–77.0)
Platelets: 221 10*3/uL (ref 150.0–400.0)
RBC: 3.55 Mil/uL — ABNORMAL LOW (ref 4.22–5.81)
RDW: 25.6 % — ABNORMAL HIGH (ref 11.5–15.5)
WBC: 4.6 10*3/uL (ref 4.0–10.5)

## 2014-04-28 ENCOUNTER — Other Ambulatory Visit: Payer: Self-pay

## 2014-04-28 DIAGNOSIS — D464 Refractory anemia, unspecified: Secondary | ICD-10-CM

## 2014-05-03 ENCOUNTER — Telehealth: Payer: Self-pay | Admitting: Hematology and Oncology

## 2014-05-03 NOTE — Telephone Encounter (Signed)
LEFT MESSAGE FOR PATIENT AND GAVE NP APPT FOR 09/17 @ 10:45 W/DR. [place]. CONTACT INFORMATION LEFT FOR PATIENT TO RETURN CALL.

## 2014-05-05 ENCOUNTER — Other Ambulatory Visit (INDEPENDENT_AMBULATORY_CARE_PROVIDER_SITE_OTHER): Payer: Medicare Other

## 2014-05-05 DIAGNOSIS — D509 Iron deficiency anemia, unspecified: Secondary | ICD-10-CM

## 2014-05-05 LAB — CBC WITH DIFFERENTIAL/PLATELET
Basophils Absolute: 0 10*3/uL (ref 0.0–0.1)
Basophils Relative: 0.6 % (ref 0.0–3.0)
Eosinophils Absolute: 0.2 10*3/uL (ref 0.0–0.7)
Eosinophils Relative: 4.3 % (ref 0.0–5.0)
HCT: 29.1 % — ABNORMAL LOW (ref 39.0–52.0)
Hemoglobin: 9.4 g/dL — ABNORMAL LOW (ref 13.0–17.0)
Lymphocytes Relative: 30 % (ref 12.0–46.0)
Lymphs Abs: 1.5 10*3/uL (ref 0.7–4.0)
MCHC: 32.2 g/dL (ref 30.0–36.0)
MCV: 80.1 fl (ref 78.0–100.0)
Monocytes Absolute: 0.9 10*3/uL (ref 0.1–1.0)
Monocytes Relative: 18.6 % — ABNORMAL HIGH (ref 3.0–12.0)
Neutro Abs: 2.3 10*3/uL (ref 1.4–7.7)
Neutrophils Relative %: 46.5 % (ref 43.0–77.0)
Platelets: 236 10*3/uL (ref 150.0–400.0)
RBC: 3.64 Mil/uL — ABNORMAL LOW (ref 4.22–5.81)
RDW: 24.7 % — ABNORMAL HIGH (ref 11.5–15.5)
WBC: 5 10*3/uL (ref 4.0–10.5)

## 2014-05-07 ENCOUNTER — Other Ambulatory Visit: Payer: Self-pay | Admitting: Internal Medicine

## 2014-05-11 ENCOUNTER — Telehealth: Payer: Self-pay | Admitting: Hematology and Oncology

## 2014-05-11 ENCOUNTER — Ambulatory Visit (HOSPITAL_BASED_OUTPATIENT_CLINIC_OR_DEPARTMENT_OTHER): Payer: Medicare Other

## 2014-05-11 ENCOUNTER — Ambulatory Visit (HOSPITAL_BASED_OUTPATIENT_CLINIC_OR_DEPARTMENT_OTHER): Payer: Medicare Other | Admitting: Hematology and Oncology

## 2014-05-11 ENCOUNTER — Encounter: Payer: Self-pay | Admitting: Hematology and Oncology

## 2014-05-11 VITALS — BP 116/52 | HR 60 | Temp 98.2°F | Resp 18 | Ht 73.0 in | Wt 233.0 lb

## 2014-05-11 DIAGNOSIS — D509 Iron deficiency anemia, unspecified: Secondary | ICD-10-CM

## 2014-05-11 DIAGNOSIS — E538 Deficiency of other specified B group vitamins: Secondary | ICD-10-CM

## 2014-05-11 DIAGNOSIS — R195 Other fecal abnormalities: Secondary | ICD-10-CM

## 2014-05-11 NOTE — Progress Notes (Signed)
[facility] NOTE  Patient Care Team: Cassandria Anger, MD as PCP - General Linton Rump, MD (Ophthalmology) Lelon Perla, MD (Cardiology) Heath Lark, MD as Consulting Physician (Hematology and Oncology)  CHIEF COMPLAINTS/PURPOSE OF CONSULTATION:  Persistent anemia  HISTORY OF PRESENTING ILLNESS:  [name] 73 y.o. male is here because of persistent, chronic anemia His baseline blood work in July of 2013 were within normal limits.  He was found to have abnormal CBC from blood drawn on 01/24/2014 which showed hemoglobin of 7 with MCV of 77.3. He was also found to have low serum vitamin B12 and he was given vitamin B12 injection. The patient subsequently had progressive fatigue and anemia. He was hospitalized and was given blood transfusions. Upper endoscopy evaluation was normal. A subsequent colonoscopy was also normal. He received iron infusion and is currently taking oral iron supplement. Currently, he denies recent chest pain on exertion, shortness of breath on minimal exertion, pre-syncopal episodes, or palpitations. He had not noticed any recent bleeding such as epistaxis, hematuria or hematochezia The patient denies over the counter NSAID ingestion. He is on antiplatelets agents.  He had no prior history or diagnosis of cancer. His age appropriate screening programs are up-to-date. He denies any pica and eats a variety of diet. He had donated blood many years ago The patient was prescribed oral iron supplements and he takes with an empty stomach 3 times a day.  MEDICAL HISTORY:  Past Medical History  Diagnosis Date  . Hyperlipidemia   . Osteoarthritis   . LBP (low back pain)   . Insomnia   . CAD (coronary artery disease) 2000    Dr Gay Filler test negative  . OSA (obstructive sleep apnea)     lost 40 lb-does not snore-wife says he is not apnic now  . BPH (benign prostatic hyperplasia)   . CTS (carpal tunnel syndrome)     better   . Sleep apnea     no cpap  . Blood transfusion without reported diagnosis 02/23/14    3 units for iron deficiency anemia    SURGICAL HISTORY: Past Surgical History  Procedure Laterality Date  . Total knee arthroplasty  2003    Right  . Lumbar fusion  2007    [place], Dr Vertell Limber  . Carpal tunnel release Bilateral 1996, 2004    Dr Lenoard Aden thumb surgery.   . Tonsillectomy  1946  . Eye surgery  2012    both cataracts, Lasik  . Colonoscopy  2005, 2012    diverticulosis.   . Esophagogastroduodenoscopy N/A 02/23/2014    Procedure: ESOPHAGOGASTRODUODENOSCOPY (EGD);  Surgeon: Irene Shipper, MD;  Location: [facility] ENDOSCOPY;  Service: Endoscopy;  Laterality: N/A;    SOCIAL HISTORY: History   Social History  . Marital Status: Widowed    Spouse Name: N/A    Number of Children: N/A  . Years of Education: N/A   Occupational History  . Retired    Social History Main Topics  . Smoking status: Former Smoker    Quit date: 01/08/1987  . Smokeless tobacco: Never Used  . Alcohol Use: Yes     Comment: rare  . Drug Use: No  . Sexual Activity: Not Currently   Other Topics Concern  . Not on file   Social History Narrative   Regular Exercise -  YES    FAMILY HISTORY: Family History  Problem Relation Age of Onset  . Aneurysm Father     AAA  . Heart  disease Father   . Cancer Brother   . Mental illness Brother     dementia  . Stomach cancer Brother   . Coronary artery disease Other   . Breast cancer Other   . Cancer Mother 53    breast cancer  . Heart disease Mother     MI  . Colon cancer Neg Hx   . Esophageal cancer Neg Hx   . Pancreatic cancer Neg Hx   . Prostate cancer Neg Hx   . Rectal cancer Neg Hx     ALLERGIES:  has No Known Allergies.  MEDICATIONS:  Current Outpatient Prescriptions  Medication Sig Dispense Refill  . aspirin 81 MG chewable tablet Chew 81 mg by mouth at bedtime.      . Avanafil (STENDRA) 100 MG TABS Take 100 mg by mouth daily as needed (erectile  dysfunction).      . Cyanocobalamin (VITAMIN B-12) 1000 MCG SUBL Place 1 tablet (1,000 mcg total) under the tongue daily.  100 tablet  3  . DULoxetine (CYMBALTA) 60 MG capsule Take 60 mg by mouth at bedtime.      . finasteride (PROSCAR) 5 MG tablet TAKE 1 TABLET (5 MG TOTAL) BY MOUTH DAILY.  90 tablet  3  . folic acid (V-R FOLIC ACID) 591 MCG tablet Take 1 tablet (400 mcg total) by mouth daily.  30 tablet  5  . furosemide (LASIX) 20 MG tablet Take 1-2 tablets (20-40 mg total) by mouth daily as needed for edema.  60 tablet  5  . iron polysaccharides (NIFEREX) 150 MG capsule Take 1 capsule (150 mg total) by mouth 3 (three) times daily.  90 capsule  5  . naproxen sodium (ANAPROX) 220 MG tablet Take 220 mg by mouth daily as needed (pain).      . simvastatin (ZOCOR) 40 MG tablet Take 40 mg by mouth at bedtime.      . tamsulosin (FLOMAX) 0.4 MG CAPS capsule TAKE ONE CAPSULE BY MOUTH EVERY DAY  90 capsule  1   No current facility-administered medications for this visit.    REVIEW OF SYSTEMS:   Constitutional: Denies fevers, chills or abnormal night sweats Eyes: Denies blurriness of vision, double vision or watery eyes Ears, nose, mouth, throat, and face: Denies mucositis or sore throat Respiratory: Denies cough, dyspnea or wheezes Cardiovascular: Denies palpitation, chest discomfort or lower extremity swelling Gastrointestinal:  Denies nausea, heartburn or change in bowel habits Skin: Denies abnormal skin rashes Lymphatics: Denies new lymphadenopathy or easy bruising Neurological:Denies numbness, tingling or new weaknesses Behavioral/Psych: Mood is stable, no new changes  All other systems were reviewed with the patient and are negative.  PHYSICAL EXAMINATION: ECOG PERFORMANCE STATUS: 1 - Symptomatic but completely ambulatory  Filed Vitals:   05/11/14 1114  BP: 116/52  Pulse: 60  Temp: 98.2 F (36.8 C)  Resp: 18   Filed Weights   05/11/14 1114  Weight: 233 lb (105.688 kg)     GENERAL:alert, no distress and comfortable SKIN: skin color, texture, turgor are normal, no rashes or significant lesions EYES: normal, conjunctiva are pale and non-injected, sclera clear OROPHARYNX:no exudate, no erythema and lips, buccal mucosa, and tongue normal  NECK: supple, thyroid normal size, non-tender, without nodularity LYMPH:  no palpable lymphadenopathy in the cervical, axillary or inguinal LUNGS: clear to auscultation and percussion with normal breathing effort HEART: regular rate & rhythm and no murmurs and no lower extremity edema ABDOMEN:abdomen soft, non-tender and normal bowel sounds Musculoskeletal:no cyanosis of digits and no  clubbing  PSYCH: alert & oriented x 3 with fluent speech NEURO: no focal motor/sensory deficits  LABORATORY DATA:  I have reviewed the data as listed  ASSESSMENT & PLAN:  Anemia, iron deficiency This is likely anemia of chronic disease with combination of iron deficiency and prior history of vitamin B12 deficiency. The patient denies recent history of bleeding such as epistaxis, hematuria or hematochezia. He is asymptomatic from the anemia. We will observe for now.  He does not require transfusion now.  I will order additional workup for this.  B12 deficiency He is currently on high-dose oral vitamin B12 supplement. This has not been rechecked. I will go ahead and recheck vitamin B12 level to make sure that this is adequately replaced.    All questions were answered. The patient knows to call the clinic with any problems, questions or concerns. I spent 40 minutes counseling the patient face to face. The total time spent in the appointment was 55 minutes and more than 50% was on counseling.     [facility], Anglea Gordner, MD 05/11/2014 9:37 PM

## 2014-05-11 NOTE — Telephone Encounter (Signed)
Lft msg for pt labs per MD to return before 10/01 scheduled for 09/22...Marland KitchenMarland KitchenKJ

## 2014-05-11 NOTE — Assessment & Plan Note (Signed)
This is likely anemia of chronic disease with combination of iron deficiency and prior history of vitamin B12 deficiency. The patient denies recent history of bleeding such as epistaxis, hematuria or hematochezia. He is asymptomatic from the anemia. We will observe for now.  He does not require transfusion now.  I will order additional workup for this.

## 2014-05-11 NOTE — Assessment & Plan Note (Signed)
He is currently on high-dose oral vitamin B12 supplement. This has not been rechecked. I will go ahead and recheck vitamin B12 level to make sure that this is adequately replaced.

## 2014-05-11 NOTE — Telephone Encounter (Signed)
gv and pritned appt sched and avs for pt for OCT...sent pt to lab

## 2014-05-11 NOTE — Progress Notes (Signed)
Checked in new patient with no financial issues prior to seeing the dr. I gave him Lenise's card if any asst is needed. He has appt card and [place].

## 2014-05-12 ENCOUNTER — Other Ambulatory Visit: Payer: Self-pay | Admitting: Hematology and Oncology

## 2014-05-12 ENCOUNTER — Other Ambulatory Visit: Payer: Self-pay | Admitting: *Deleted

## 2014-05-12 LAB — RHEUMATOID FACTOR: Rhuematoid fact SerPl-aCnc: <10 IU/mL (ref <=14)

## 2014-05-12 LAB — ANA: Anti Nuclear Antibody(ANA): NEGATIVE

## 2014-05-15 ENCOUNTER — Other Ambulatory Visit: Payer: Self-pay | Admitting: Hematology and Oncology

## 2014-05-16 ENCOUNTER — Other Ambulatory Visit (HOSPITAL_BASED_OUTPATIENT_CLINIC_OR_DEPARTMENT_OTHER): Payer: Medicare Other

## 2014-05-16 DIAGNOSIS — R195 Other fecal abnormalities: Secondary | ICD-10-CM

## 2014-05-16 DIAGNOSIS — D509 Iron deficiency anemia, unspecified: Secondary | ICD-10-CM

## 2014-05-16 DIAGNOSIS — E538 Deficiency of other specified B group vitamins: Secondary | ICD-10-CM

## 2014-05-16 LAB — MORPHOLOGY: PLT EST: ADEQUATE

## 2014-05-16 LAB — CBC & DIFF AND RETIC
BASO%: 0.7 % (ref 0.0–2.0)
Basophils Absolute: 0 10*3/uL (ref 0.0–0.1)
EOS%: 3.9 % (ref 0.0–7.0)
Eosinophils Absolute: 0.2 10*3/uL (ref 0.0–0.5)
HCT: 32 % — ABNORMAL LOW (ref 38.4–49.9)
HGB: 9.8 g/dL — ABNORMAL LOW (ref 13.0–17.1)
Immature Retic Fract: 14.2 % — ABNORMAL HIGH (ref 3.00–10.60)
LYMPH%: 31 % (ref 14.0–49.0)
MCH: 25.7 pg — ABNORMAL LOW (ref 27.2–33.4)
MCHC: 30.6 g/dL — ABNORMAL LOW (ref 32.0–36.0)
MCV: 83.8 fL (ref 79.3–98.0)
MONO#: 0.7 10*3/uL (ref 0.1–0.9)
MONO%: 15.7 % — ABNORMAL HIGH (ref 0.0–14.0)
NEUT#: 2 10*3/uL (ref 1.5–6.5)
NEUT%: 48.7 % (ref 39.0–75.0)
Platelets: 233 10*3/uL (ref 140–400)
RBC: 3.82 10*6/uL — ABNORMAL LOW (ref 4.20–5.82)
RDW: 18.9 % — ABNORMAL HIGH (ref 11.0–14.6)
Retic %: 1.93 % — ABNORMAL HIGH (ref 0.80–1.80)
Retic Ct Abs: 73.73 10*3/uL (ref 34.80–93.90)
WBC: 4.1 10*3/uL (ref 4.0–10.3)
lymph#: 1.3 10*3/uL (ref 0.9–3.3)

## 2014-05-16 LAB — COMPREHENSIVE METABOLIC PANEL (CC13)
ALT: 9 U/L (ref 0–55)
AST: 19 U/L (ref 5–34)
Albumin: 3.7 g/dL (ref 3.5–5.0)
Alkaline Phosphatase: 66 U/L (ref 40–150)
Anion Gap: 7 mEq/L (ref 3–11)
BUN: 17.6 mg/dL (ref 7.0–26.0)
CO2: 28 mEq/L (ref 22–29)
Calcium: 9.4 mg/dL (ref 8.4–10.4)
Chloride: 105 mEq/L (ref 98–109)
Creatinine: 0.9 mg/dL (ref 0.7–1.3)
Glucose: 98 mg/dl (ref 70–140)
Potassium: 3.8 mEq/L (ref 3.5–5.1)
Sodium: 140 mEq/L (ref 136–145)
Total Bilirubin: 0.5 mg/dL (ref 0.20–1.20)
Total Protein: 6.9 g/dL (ref 6.4–8.3)

## 2014-05-16 LAB — IRON AND TIBC CHCC
%SAT: 7 % — ABNORMAL LOW (ref 20–55)
Iron: 27 ug/dL — ABNORMAL LOW (ref 42–163)
TIBC: 381 ug/dL (ref 202–409)
UIBC: 354 ug/dL (ref 117–376)

## 2014-05-16 LAB — LACTATE DEHYDROGENASE (CC13): LDH: 164 U/L (ref 125–245)

## 2014-05-16 LAB — FERRITIN CHCC: Ferritin: 17 ng/ml — ABNORMAL LOW (ref 22–316)

## 2014-05-18 LAB — GLIA (IGA/G) + TTG IGA
Gliadin IgA: 6.5 U/mL (ref <20)
Gliadin IgG: 4.5 U/mL (ref <20)
Tissue Transglutaminase Ab, IgA: 4.2 U/mL (ref <20)

## 2014-05-18 LAB — VITAMIN B12: Vitamin B-12: 536 pg/mL (ref 211–911)

## 2014-05-18 LAB — SEDIMENTATION RATE: Sed Rate: 11 mm/hr (ref 0–16)

## 2014-05-18 LAB — ERYTHROPOIETIN: Erythropoietin: 56.6 m[IU]/mL — ABNORMAL HIGH (ref 2.6–18.5)

## 2014-05-22 ENCOUNTER — Other Ambulatory Visit (INDEPENDENT_AMBULATORY_CARE_PROVIDER_SITE_OTHER): Payer: Medicare Other

## 2014-05-22 DIAGNOSIS — D509 Iron deficiency anemia, unspecified: Secondary | ICD-10-CM

## 2014-05-22 LAB — CBC WITH DIFFERENTIAL/PLATELET
Basophils Absolute: 0 10*3/uL (ref 0.0–0.1)
Basophils Relative: 1 % (ref 0.0–3.0)
Eosinophils Absolute: 0.2 10*3/uL (ref 0.0–0.7)
Eosinophils Relative: 3.4 % (ref 0.0–5.0)
HCT: 33.2 % — ABNORMAL LOW (ref 39.0–52.0)
Hemoglobin: 10.6 g/dL — ABNORMAL LOW (ref 13.0–17.0)
Lymphocytes Relative: 29.4 % (ref 12.0–46.0)
Lymphs Abs: 1.3 10*3/uL (ref 0.7–4.0)
MCHC: 31.8 g/dL (ref 30.0–36.0)
MCV: 82.1 fl (ref 78.0–100.0)
Monocytes Absolute: 0.6 10*3/uL (ref 0.1–1.0)
Monocytes Relative: 13.6 % — ABNORMAL HIGH (ref 3.0–12.0)
Neutro Abs: 2.4 10*3/uL (ref 1.4–7.7)
Neutrophils Relative %: 52.6 % (ref 43.0–77.0)
Platelets: 277 10*3/uL (ref 150.0–400.0)
RBC: 4.04 Mil/uL — ABNORMAL LOW (ref 4.22–5.81)
RDW: 19.6 % — ABNORMAL HIGH (ref 11.5–15.5)
WBC: 4.6 10*3/uL (ref 4.0–10.5)

## 2014-05-25 ENCOUNTER — Encounter: Payer: Self-pay | Admitting: Hematology and Oncology

## 2014-05-25 ENCOUNTER — Telehealth: Payer: Self-pay | Admitting: Hematology and Oncology

## 2014-05-25 ENCOUNTER — Telehealth: Payer: Self-pay | Admitting: *Deleted

## 2014-05-25 ENCOUNTER — Ambulatory Visit (HOSPITAL_BASED_OUTPATIENT_CLINIC_OR_DEPARTMENT_OTHER): Payer: Medicare Other | Admitting: Hematology and Oncology

## 2014-05-25 VITALS — BP 130/65 | HR 65 | Temp 98.4°F | Resp 18 | Ht 73.0 in | Wt 234.1 lb

## 2014-05-25 DIAGNOSIS — D509 Iron deficiency anemia, unspecified: Secondary | ICD-10-CM

## 2014-05-25 DIAGNOSIS — E538 Deficiency of other specified B group vitamins: Secondary | ICD-10-CM

## 2014-05-25 DIAGNOSIS — R531 Weakness: Secondary | ICD-10-CM

## 2014-05-25 NOTE — Telephone Encounter (Signed)
Per staff phone call and POF I have schedueld appts. Scheduler advised of appts.  JMW  

## 2014-05-25 NOTE — Assessment & Plan Note (Signed)
He is currently on high-dose oral vitamin B12 supplement. Recheck vitamin B12 showed that this is adequately replaced.

## 2014-05-25 NOTE — Assessment & Plan Note (Signed)
The most likely cause of his anemia is due to chronic blood loss/malabsorption syndrome. We discussed some of the risks, benefits, and alternatives of intravenous iron infusions. The patient is symptomatic from anemia and the iron level is critically low. He tolerated oral iron supplement poorly and desires to achieved higher levels of iron faster for adequate hematopoesis. Some of the side-effects to be expected including risks of infusion reactions, phlebitis, headaches, nausea and fatigue.  The patient is willing to proceed. Patient education material was dispensed.  Goal is to keep ferritin level greater than 50 I would recheck a CBC a month from now and see him back for possible more iron infusion if needed.

## 2014-05-25 NOTE — Progress Notes (Signed)
[place] OFFICE PROGRESS NOTE  [name], MD SUMMARY OF HEMATOLOGIC HISTORY: His baseline blood work in July of 2013 were within normal limits. He was found to have abnormal CBC from blood drawn on 01/24/2014 which showed hemoglobin of 7 with MCV of 77.3. He was also found to have low serum vitamin B12 and he was given vitamin B12 injection. The patient subsequently had progressive fatigue and anemia. He was hospitalized and was given blood transfusions. Upper endoscopy evaluation was normal. A subsequent colonoscopy was also normal. He received iron infusion and is currently taking oral iron supplement. The patient denies over the counter NSAID ingestion. He is on antiplatelets agents. INTERVAL HISTORY: [name] 73 y.o. male returns for further followup. He complained of fatigue. Currently, he denies recent chest pain on exertion, shortness of breath on minimal exertion, pre-syncopal episodes, or palpitations. He had not noticed any recent bleeding such as epistaxis, hematuria or hematochezia I have reviewed the past medical history, past surgical history, social history and family history with the patient and they are unchanged from previous note.  ALLERGIES:  has No Known Allergies.  MEDICATIONS:  Current Outpatient Prescriptions  Medication Sig Dispense Refill  . aspirin 81 MG chewable tablet Chew 81 mg by mouth at bedtime.      . Avanafil (STENDRA) 100 MG TABS Take 100 mg by mouth daily as needed (erectile dysfunction).      . Cyanocobalamin (VITAMIN B-12) 1000 MCG SUBL Place 1 tablet (1,000 mcg total) under the tongue daily.  100 tablet  3  . DULoxetine (CYMBALTA) 60 MG capsule Take 60 mg by mouth at bedtime.      . finasteride (PROSCAR) 5 MG tablet TAKE 1 TABLET (5 MG TOTAL) BY MOUTH DAILY.  90 tablet  3  . folic acid (V-R FOLIC ACID) 469 MCG tablet Take 1 tablet (400 mcg total) by mouth daily.  30 tablet  5  . furosemide (LASIX) 20 MG tablet Take 1-2  tablets (20-40 mg total) by mouth daily as needed for edema.  60 tablet  5  . iron polysaccharides (NIFEREX) 150 MG capsule Take 1 capsule (150 mg total) by mouth 3 (three) times daily.  90 capsule  5  . naproxen sodium (ANAPROX) 220 MG tablet Take 220 mg by mouth daily as needed (pain).      . simvastatin (ZOCOR) 40 MG tablet Take 40 mg by mouth at bedtime.      . tamsulosin (FLOMAX) 0.4 MG CAPS capsule TAKE ONE CAPSULE BY MOUTH EVERY DAY  90 capsule  1   No current facility-administered medications for this visit.     REVIEW OF SYSTEMS:   Constitutional: Denies fevers, chills or night sweats Eyes: Denies blurriness of vision Ears, nose, mouth, throat, and face: Denies mucositis or sore throat Respiratory: Denies cough, dyspnea or wheezes Cardiovascular: Denies palpitation, chest discomfort or lower extremity swelling Gastrointestinal:  Denies nausea, heartburn or change in bowel habits Skin: Denies abnormal skin rashes Lymphatics: Denies new lymphadenopathy or easy bruising Neurological:Denies numbness, tingling or new weaknesses Behavioral/Psych: Mood is stable, no new changes  All other systems were reviewed with the patient and are negative.  PHYSICAL EXAMINATION: ECOG PERFORMANCE STATUS: 0 - Asymptomatic  Filed Vitals:   05/25/14 1352  BP: 130/65  Pulse: 65  Temp: 98.4 F (36.9 C)  Resp: 18   Filed Weights   05/25/14 1352  Weight: 234 lb 1.6 oz (106.187 kg)    GENERAL:alert, no distress and comfortable. He  is moderately obese SKIN: skin color, texture, turgor are normal, no rashes or significant lesions EYES: normal, Conjunctiva are pale and non-injected, sclera clear Musculoskeletal:no cyanosis of digits and no clubbing  NEURO: alert & oriented x 3 with fluent speech, no focal motor/sensory deficits  LABORATORY DATA:  I have reviewed the data as listed No results found for this or any previous visit (from the past 48 hour(s)).  Lab Results  Component Value Date    WBC 4.6 05/22/2014   HGB 10.6* 05/22/2014   HCT 33.2* 05/22/2014   MCV 82.1 05/22/2014   PLT 277.0 05/22/2014    ASSESSMENT & PLAN:  Anemia, iron deficiency The most likely cause of his anemia is due to chronic blood loss/malabsorption syndrome. We discussed some of the risks, benefits, and alternatives of intravenous iron infusions. The patient is symptomatic from anemia and the iron level is critically low. He tolerated oral iron supplement poorly and desires to achieved higher levels of iron faster for adequate hematopoesis. Some of the side-effects to be expected including risks of infusion reactions, phlebitis, headaches, nausea and fatigue.  The patient is willing to proceed. Patient education material was dispensed.  Goal is to keep ferritin level greater than 50 I would recheck a CBC a month from now and see him back for possible more iron infusion if needed.  B12 deficiency He is currently on high-dose oral vitamin B12 supplement. Recheck vitamin B12 showed that this is adequately replaced.      All questions were answered. The patient knows to call the clinic with any problems, questions or concerns. No barriers to learning was detected.  I spent 25 minutes counseling the patient face to face. The total time spent in the appointment was 30 minutes and more than 50% was on counseling.     [facility], [place], MD 05/25/2014 3:45 PM

## 2014-05-26 ENCOUNTER — Ambulatory Visit (HOSPITAL_BASED_OUTPATIENT_CLINIC_OR_DEPARTMENT_OTHER): Payer: Medicare Other

## 2014-05-26 VITALS — BP 124/64 | HR 80 | Temp 98.0°F | Resp 18

## 2014-05-26 DIAGNOSIS — D509 Iron deficiency anemia, unspecified: Secondary | ICD-10-CM | POA: Diagnosis not present

## 2014-05-26 MED ORDER — SODIUM CHLORIDE 0.9 % IV SOLN
1020.0000 mg | Freq: Once | INTRAVENOUS | Status: AC
  Administered 2014-05-26: 1020 mg via INTRAVENOUS
  Filled 2014-05-26: qty 34

## 2014-05-26 MED ORDER — SODIUM CHLORIDE 0.9 % IV SOLN
Freq: Once | INTRAVENOUS | Status: AC
  Administered 2014-05-26: 09:00:00 via INTRAVENOUS

## 2014-05-26 NOTE — Patient Instructions (Signed)
Anemia, Nonspecific Anemia is a condition in which the concentration of red blood cells or hemoglobin in the blood is below normal. Hemoglobin is a substance in red blood cells that carries oxygen to the tissues of the body. Anemia results in not enough oxygen reaching these tissues.  CAUSES  Common causes of anemia include:   Excessive bleeding. Bleeding may be internal or external. This includes excessive bleeding from periods (in women) or from the intestine.   Poor nutrition.   Chronic kidney, thyroid, and liver disease.  Bone marrow disorders that decrease red blood cell production.  Cancer and treatments for cancer.  HIV, AIDS, and their treatments.  Spleen problems that increase red blood cell destruction.  Blood disorders.  Excess destruction of red blood cells due to infection, medicines, and autoimmune disorders. SIGNS AND SYMPTOMS   Minor weakness.   Dizziness.   Headache.  Palpitations.   Shortness of breath, especially with exercise.   Paleness.  Cold sensitivity.  Indigestion.  Nausea.  Difficulty sleeping.  Difficulty concentrating. Symptoms may occur suddenly or they may develop slowly.  DIAGNOSIS  Additional blood tests are often needed. These help your health care provider determine the best treatment. Your health care provider will check your stool for blood and look for other causes of blood loss.  TREATMENT  Treatment varies depending on the cause of the anemia. Treatment can include:   Supplements of iron, vitamin B12, or folic acid.   Hormone medicines.   A blood transfusion. This may be needed if blood loss is severe.   Hospitalization. This may be needed if there is significant continual blood loss.   Dietary changes.  Spleen removal. HOME CARE INSTRUCTIONS Keep all follow-up appointments. It often takes many weeks to correct anemia, and having your health care provider check on your condition and your response to  treatment is very important. SEEK IMMEDIATE MEDICAL CARE IF:   You develop extreme weakness, shortness of breath, or chest pain.   You become dizzy or have trouble concentrating.  You develop heavy vaginal bleeding.   You develop a rash.   You have bloody or black, tarry stools.   You faint.   You vomit up blood.   You vomit repeatedly.   You have abdominal pain.  You have a fever or persistent symptoms for more than 2-3 days.   You have a fever and your symptoms suddenly get worse.   You are dehydrated.  MAKE SURE YOU:  Understand these instructions.  Will watch your condition.  Will get help right away if you are not doing well or get worse. Document Released: 09/18/2004 Document Revised: 04/13/2013 Document Reviewed: 02/04/2013 [employer] Patient Information 2015 [employer]. This information is not intended to replace advice given to you by your health care provider. Make sure you discuss any questions you have with your health care provider.  

## 2014-05-30 ENCOUNTER — Other Ambulatory Visit (INDEPENDENT_AMBULATORY_CARE_PROVIDER_SITE_OTHER): Payer: Medicare Other

## 2014-05-30 DIAGNOSIS — D509 Iron deficiency anemia, unspecified: Secondary | ICD-10-CM

## 2014-05-30 LAB — CBC WITH DIFFERENTIAL/PLATELET
Basophils Absolute: 0 10*3/uL (ref 0.0–0.1)
Basophils Relative: 1.1 % (ref 0.0–3.0)
Eosinophils Absolute: 0.2 10*3/uL (ref 0.0–0.7)
Eosinophils Relative: 3.9 % (ref 0.0–5.0)
HCT: 35.2 % — ABNORMAL LOW (ref 39.0–52.0)
Hemoglobin: 11.3 g/dL — ABNORMAL LOW (ref 13.0–17.0)
Lymphocytes Relative: 25.9 % (ref 12.0–46.0)
Lymphs Abs: 1.2 10*3/uL (ref 0.7–4.0)
MCHC: 32 g/dL (ref 30.0–36.0)
MCV: 82.9 fl (ref 78.0–100.0)
Monocytes Absolute: 0.8 10*3/uL (ref 0.1–1.0)
Monocytes Relative: 18 % — ABNORMAL HIGH (ref 3.0–12.0)
Neutro Abs: 2.4 10*3/uL (ref 1.4–7.7)
Neutrophils Relative %: 51.1 % (ref 43.0–77.0)
Platelets: 266 10*3/uL (ref 150.0–400.0)
RBC: 4.24 Mil/uL (ref 4.22–5.81)
RDW: 19.4 % — ABNORMAL HIGH (ref 11.5–15.5)
WBC: 4.7 10*3/uL (ref 4.0–10.5)

## 2014-06-16 ENCOUNTER — Other Ambulatory Visit: Payer: Self-pay | Admitting: Internal Medicine

## 2014-06-20 ENCOUNTER — Ambulatory Visit: Payer: Medicare Other | Admitting: Internal Medicine

## 2014-06-26 ENCOUNTER — Other Ambulatory Visit (HOSPITAL_BASED_OUTPATIENT_CLINIC_OR_DEPARTMENT_OTHER): Payer: Medicare Other

## 2014-06-26 ENCOUNTER — Other Ambulatory Visit (INDEPENDENT_AMBULATORY_CARE_PROVIDER_SITE_OTHER): Payer: Medicare Other

## 2014-06-26 ENCOUNTER — Other Ambulatory Visit: Payer: Self-pay

## 2014-06-26 DIAGNOSIS — D509 Iron deficiency anemia, unspecified: Secondary | ICD-10-CM

## 2014-06-26 LAB — CBC & DIFF AND RETIC
BASO%: 1 % (ref 0.0–2.0)
Basophils Absolute: 0 10*3/uL (ref 0.0–0.1)
EOS%: 5 % (ref 0.0–7.0)
Eosinophils Absolute: 0.2 10*3/uL (ref 0.0–0.5)
HCT: 39.2 % (ref 38.4–49.9)
HGB: 12.4 g/dL — ABNORMAL LOW (ref 13.0–17.1)
Immature Retic Fract: 6.2 % (ref 3.00–10.60)
LYMPH%: 31 % (ref 14.0–49.0)
MCH: 27.8 pg (ref 27.2–33.4)
MCHC: 31.6 g/dL — ABNORMAL LOW (ref 32.0–36.0)
MCV: 87.9 fL (ref 79.3–98.0)
MONO#: 0.7 10*3/uL (ref 0.1–0.9)
MONO%: 16.5 % — ABNORMAL HIGH (ref 0.0–14.0)
NEUT#: 2 10*3/uL (ref 1.5–6.5)
NEUT%: 46.5 % (ref 39.0–75.0)
Platelets: 174 10*3/uL (ref 140–400)
RBC: 4.46 10*6/uL (ref 4.20–5.82)
RDW: 16.4 % — ABNORMAL HIGH (ref 11.0–14.6)
Retic %: 1.5 % (ref 0.80–1.80)
Retic Ct Abs: 66.9 10*3/uL (ref 34.80–93.90)
WBC: 4.2 10*3/uL (ref 4.0–10.3)
lymph#: 1.3 10*3/uL (ref 0.9–3.3)

## 2014-06-26 LAB — IRON AND TIBC CHCC
%SAT: 15 % — ABNORMAL LOW (ref 20–55)
Iron: 48 ug/dL (ref 42–163)
TIBC: 323 ug/dL (ref 202–409)
UIBC: 275 ug/dL (ref 117–376)

## 2014-06-26 LAB — CBC WITH DIFFERENTIAL/PLATELET
Basophils Absolute: 0 10*3/uL (ref 0.0–0.1)
Basophils Relative: 0.5 % (ref 0.0–3.0)
Eosinophils Absolute: 0.3 10*3/uL (ref 0.0–0.7)
Eosinophils Relative: 4.7 % (ref 0.0–5.0)
HCT: 40.4 % (ref 39.0–52.0)
Hemoglobin: 13.1 g/dL (ref 13.0–17.0)
Lymphocytes Relative: 25.4 % (ref 12.0–46.0)
Lymphs Abs: 1.5 10*3/uL (ref 0.7–4.0)
MCHC: 32.4 g/dL (ref 30.0–36.0)
MCV: 86.3 fl (ref 78.0–100.0)
Monocytes Absolute: 0.9 10*3/uL (ref 0.1–1.0)
Monocytes Relative: 14.7 % — ABNORMAL HIGH (ref 3.0–12.0)
Neutro Abs: 3.2 10*3/uL (ref 1.4–7.7)
Neutrophils Relative %: 54.7 % (ref 43.0–77.0)
Platelets: 200 10*3/uL (ref 150.0–400.0)
RBC: 4.68 Mil/uL (ref 4.22–5.81)
RDW: 18.7 % — ABNORMAL HIGH (ref 11.5–15.5)
WBC: 5.8 10*3/uL (ref 4.0–10.5)

## 2014-06-26 LAB — SEDIMENTATION RATE: Sed Rate: 6 mm/hr (ref 0–16)

## 2014-06-26 LAB — FERRITIN CHCC: Ferritin: 79 ng/ml (ref 22–316)

## 2014-07-03 ENCOUNTER — Ambulatory Visit (HOSPITAL_BASED_OUTPATIENT_CLINIC_OR_DEPARTMENT_OTHER): Payer: Medicare Other | Admitting: Hematology and Oncology

## 2014-07-03 ENCOUNTER — Encounter: Payer: Self-pay | Admitting: Hematology and Oncology

## 2014-07-03 VITALS — BP 117/60 | HR 68 | Temp 98.7°F | Resp 18 | Ht 73.0 in | Wt 236.8 lb

## 2014-07-03 DIAGNOSIS — E538 Deficiency of other specified B group vitamins: Secondary | ICD-10-CM

## 2014-07-03 DIAGNOSIS — D509 Iron deficiency anemia, unspecified: Secondary | ICD-10-CM

## 2014-07-03 NOTE — Assessment & Plan Note (Signed)
He responded well to intravenous iron with resolution of his anemia. Repeat ferritin is normal. I suspect he may have problems again in the future due to presence of AVM. I recommend he gets periodic CBC recheck with his primary care provider, the next within 3-6 months. I'll be happy to see him back in the future if he have recurrent iron deficiency anemia again.

## 2014-07-03 NOTE — Progress Notes (Signed)
[name] OFFICE PROGRESS NOTE  Lee Kehr, MD SUMMARY OF HEMATOLOGIC HISTORY: His baseline blood work in July of 2013 were within normal limits. He was found to have abnormal CBC from blood drawn on 01/24/2014 which showed hemoglobin of 7 with MCV of 77.3. He was also found to have low serum vitamin B12 and he was given vitamin B12 injection. The patient subsequently had progressive fatigue and anemia. He was hospitalized and was given blood transfusions. Upper endoscopy evaluation was normal. A subsequent colonoscopy was also normal. He received iron infusion and is currently taking oral iron supplement. The patient denies over the counter NSAID ingestion. He is on antiplatelets agents. He was given intramuscular vitamin B-12 injection and iron infusion in October 2015. INTERVAL HISTORY: [name] 73 y.o. male returns for further follow-up. His energy level is fair. The patient denies any recent signs or symptoms of bleeding such as spontaneous epistaxis, hematuria or hematochezia.   I have reviewed the past medical history, past surgical history, social history and family history with the patient and they are unchanged from previous note.  ALLERGIES:  has No Known Allergies.  MEDICATIONS:  Current Outpatient Prescriptions  Medication Sig Dispense Refill  . aspirin 81 MG chewable tablet Chew 81 mg by mouth at bedtime.    . Avanafil (STENDRA) 100 MG TABS Take 100 mg by mouth daily as needed (erectile dysfunction).    . Cyanocobalamin (VITAMIN B-12) 1000 MCG SUBL Place 1 tablet (1,000 mcg total) under the tongue daily. 100 tablet 3  . DULoxetine (CYMBALTA) 60 MG capsule TAKE 1 CAPSULE (60 MG TOTAL) BY MOUTH DAILY. 90 capsule 0  . finasteride (PROSCAR) 5 MG tablet TAKE 1 TABLET (5 MG TOTAL) BY MOUTH DAILY. 90 tablet 3  . folic acid (V-R FOLIC ACID) 703 MCG tablet Take 1 tablet (400 mcg total) by mouth daily. 30 tablet 5  . furosemide (LASIX) 20 MG tablet Take 1-2  tablets (20-40 mg total) by mouth daily as needed for edema. 60 tablet 5  . iron polysaccharides (NIFEREX) 150 MG capsule Take 1 capsule (150 mg total) by mouth 3 (three) times daily. 90 capsule 5  . naproxen sodium (ANAPROX) 220 MG tablet Take 220 mg by mouth daily as needed (pain).    . simvastatin (ZOCOR) 40 MG tablet Take 40 mg by mouth at bedtime.    . tamsulosin (FLOMAX) 0.4 MG CAPS capsule TAKE ONE CAPSULE BY MOUTH EVERY DAY 90 capsule 1   No current facility-administered medications for this visit.     REVIEW OF SYSTEMS:   Constitutional: Denies fevers, chills or night sweats Eyes: Denies blurriness of vision Ears, nose, mouth, throat, and face: Denies mucositis or sore throat Respiratory: Denies cough, dyspnea or wheezes Cardiovascular: Denies palpitation, chest discomfort or lower extremity swelling Gastrointestinal:  Denies nausea, heartburn or change in bowel habits Skin: Denies abnormal skin rashes Lymphatics: Denies new lymphadenopathy or easy bruising Neurological:Denies numbness, tingling or new weaknesses Behavioral/Psych: Mood is stable, no new changes  All other systems were reviewed with the patient and are negative.  PHYSICAL EXAMINATION: ECOG PERFORMANCE STATUS: 0 - Asymptomatic  Filed Vitals:   07/03/14 1310  BP: 117/60  Pulse: 68  Temp: 98.7 F (37.1 C)  Resp: 18   Filed Weights   07/03/14 1310  Weight: 236 lb 12.8 oz (107.412 kg)    GENERAL:alert, no distress and comfortable SKIN: skin color, texture, turgor are normal, no rashes or significant lesions EYES: normal, Conjunctiva are pink and  non-injected, sclera clear Musculoskeletal:no cyanosis of digits and no clubbing  NEURO: alert & oriented x 3 with fluent speech, no focal motor/sensory deficits  LABORATORY DATA:  I have reviewed the data as listed No results found for this or any previous visit (from the past 48 hour(s)).  Lab Results  Component Value Date   WBC 5.8 06/26/2014   HGB 13.1  06/26/2014   HCT 40.4 06/26/2014   MCV 86.3 06/26/2014   PLT 200.0 06/26/2014   ASSESSMENT & PLAN:  Anemia, iron deficiency He responded well to intravenous iron with resolution of his anemia. Repeat ferritin is normal. I suspect he may have problems again in the future due to presence of AVM. I recommend he gets periodic CBC recheck with his primary care provider, the next within 3-6 months. I'll be happy to see him back in the future if he have recurrent iron deficiency anemia again.  B12 deficiency This was recently replaced adequately with intramuscular injection. Recommend he takes oral vitamin B-12 supplements indefinitely.   All questions were answered. The patient knows to call the clinic with any problems, questions or concerns. No barriers to learning was detected.  I spent 15 minutes counseling the patient face to face. The total time spent in the appointment was 20 minutes and more than 50% was on counseling.     [facility], [place], MD 07/03/2014 2:24 PM

## 2014-07-03 NOTE — Assessment & Plan Note (Signed)
This was recently replaced adequately with intramuscular injection. Recommend he takes oral vitamin B-12 supplements indefinitely.

## 2014-07-06 ENCOUNTER — Other Ambulatory Visit: Payer: Self-pay | Admitting: Hematology and Oncology

## 2014-07-10 ENCOUNTER — Ambulatory Visit (INDEPENDENT_AMBULATORY_CARE_PROVIDER_SITE_OTHER): Payer: Medicare Other | Admitting: Internal Medicine

## 2014-07-10 ENCOUNTER — Encounter: Payer: Self-pay | Admitting: Internal Medicine

## 2014-07-10 DIAGNOSIS — I10 Essential (primary) hypertension: Secondary | ICD-10-CM

## 2014-07-10 DIAGNOSIS — M5440 Lumbago with sciatica, unspecified side: Secondary | ICD-10-CM

## 2014-07-10 DIAGNOSIS — F4321 Adjustment disorder with depressed mood: Secondary | ICD-10-CM

## 2014-07-10 DIAGNOSIS — D509 Iron deficiency anemia, unspecified: Secondary | ICD-10-CM

## 2014-07-10 DIAGNOSIS — Z23 Encounter for immunization: Secondary | ICD-10-CM

## 2014-07-10 DIAGNOSIS — E538 Deficiency of other specified B group vitamins: Secondary | ICD-10-CM

## 2014-07-10 NOTE — Assessment & Plan Note (Signed)
Continue with current prescription therapy as reflected on the Med list.  

## 2014-07-10 NOTE — Assessment & Plan Note (Signed)
Discussed.

## 2014-07-10 NOTE — Assessment & Plan Note (Signed)
Better  

## 2014-07-10 NOTE — Progress Notes (Signed)
   Subjective:    HPI   F/u anemia with Hgb 7.0 prior, last Hgb - 13 F/u weakness in B thighs when walking x 3-4 months; they feel "tired" - better. No LBP. No leg pain. C/o fatigue... The patient presents for a follow-up of  chronic hypertension, chronic dyslipidemia, CAD controlled with medicines.  C/o ED    Wt Readings from Last 3 Encounters:  07/10/14 233 lb (105.688 kg)  07/03/14 236 lb 12.8 oz (107.412 kg)  05/25/14 234 lb 1.6 oz (106.187 kg)   BP Readings from Last 3 Encounters:  07/10/14 122/74  07/03/14 117/60  05/26/14 124/64      Review of Systems  Constitutional: Negative for appetite change, fatigue and unexpected weight change.  HENT: Negative for nosebleeds and trouble swallowing.   Eyes: Negative for itching and visual disturbance.  Respiratory: Positive for chest tightness.   Cardiovascular: Negative for palpitations and leg swelling.  Gastrointestinal: Negative for blood in stool and abdominal distention.  Genitourinary: Negative for frequency and hematuria.  Musculoskeletal: Positive for back pain and arthralgias. Negative for joint swelling and gait problem.  Neurological: Negative for dizziness, tremors, speech difficulty and weakness.  Psychiatric/Behavioral: Negative for suicidal ideas, sleep disturbance, dysphoric mood and agitation. The patient is not nervous/anxious.        Objective:   Physical Exam  Constitutional: He is oriented to person, place, and time. He appears well-developed. No distress.  NAD  HENT:  Mouth/Throat: Oropharynx is clear and moist.  Eyes: Conjunctivae are normal. Pupils are equal, round, and reactive to light.  Neck: Normal range of motion. No JVD present. No thyromegaly present.  Cardiovascular: Normal rate, regular rhythm, normal heart sounds and intact distal pulses.  Exam reveals no gallop and no friction rub.   No murmur heard. Pulmonary/Chest: Effort normal and breath sounds normal. No respiratory distress. He  has no wheezes. He has no rales. He exhibits no tenderness.  Abdominal: Soft. Bowel sounds are normal. He exhibits no distension and no mass. There is no tenderness. There is no rebound and no guarding.  Musculoskeletal: Normal range of motion. He exhibits no edema or tenderness.  Lymphadenopathy:    He has no cervical adenopathy.  Neurological: He is alert and oriented to person, place, and time. He has normal reflexes. No cranial nerve deficit. He exhibits normal muscle tone. He displays a negative Romberg sign. Coordination and gait normal.  No meningeal signs  Skin: Skin is warm and dry. No rash noted.  Psychiatric: He has a normal mood and affect. His behavior is normal. Judgment and thought content normal.  Tanned, not  pale    Lab Results  Component Value Date   WBC 5.8 06/26/2014   HGB 13.1 06/26/2014   HCT 40.4 06/26/2014   PLT 200.0 06/26/2014   GLUCOSE 98 05/16/2014   CHOL 162 11/07/2013   TRIG 122.0 11/07/2013   HDL 57.30 11/07/2013   LDLDIRECT 157.5 07/05/2008   LDLCALC 80 11/07/2013   ALT 9 05/16/2014   AST 19 05/16/2014   NA 140 05/16/2014   K 3.8 05/16/2014   CL 103 03/06/2014   CREATININE 0.9 05/16/2014   BUN 17.6 05/16/2014   CO2 28 05/16/2014   TSH 1.290 02/21/2014   PSA 0.49 03/01/2012   INR 1.18 02/22/2014        Assessment & Plan:

## 2014-07-10 NOTE — Progress Notes (Signed)
Pre visit review using our clinic review tool, if applicable. No additional management support is needed unless otherwise documented below in the visit note. 

## 2014-07-27 ENCOUNTER — Telehealth: Payer: Self-pay | Admitting: Internal Medicine

## 2014-07-27 NOTE — Telephone Encounter (Signed)
Pt came by office requesting a note to return to work since his last visit with [name]. Pt states lab came back normal from last visit but his employer is requesting a note from Dr [employer]. Pt would like the note to specifically state that he is healthy and can return to work. Please contact pt when request is complete so that he can pick up this letter as soon as possible.

## 2014-07-28 ENCOUNTER — Encounter: Payer: Self-pay | Admitting: *Deleted

## 2014-07-28 NOTE — Telephone Encounter (Signed)
Ok per MD- Letter done and given to pt.

## 2014-09-20 ENCOUNTER — Other Ambulatory Visit: Payer: Self-pay | Admitting: *Deleted

## 2014-09-20 MED ORDER — TAMSULOSIN HCL 0.4 MG PO CAPS: 0.4000 mg | ORAL_CAPSULE | Freq: Every day | ORAL | Status: DC

## 2014-09-20 MED ORDER — FOLIC ACID 400 MCG PO TABS: 400.0000 ug | ORAL_TABLET | Freq: Every day | ORAL | Status: DC

## 2014-09-20 MED ORDER — DULOXETINE HCL 60 MG PO CPEP: 60.0000 mg | ORAL_CAPSULE | Freq: Every day | ORAL | Status: DC

## 2014-09-26 DIAGNOSIS — D2311 Other benign neoplasm of skin of right eyelid, including canthus: Secondary | ICD-10-CM | POA: Diagnosis not present

## 2014-09-26 DIAGNOSIS — H26493 Other secondary cataract, bilateral: Secondary | ICD-10-CM | POA: Diagnosis not present

## 2014-10-10 ENCOUNTER — Encounter: Payer: Self-pay | Admitting: Internal Medicine

## 2014-10-10 ENCOUNTER — Ambulatory Visit (INDEPENDENT_AMBULATORY_CARE_PROVIDER_SITE_OTHER): Payer: Medicare Other | Admitting: Internal Medicine

## 2014-10-10 ENCOUNTER — Other Ambulatory Visit (INDEPENDENT_AMBULATORY_CARE_PROVIDER_SITE_OTHER): Payer: Medicare Other

## 2014-10-10 VITALS — BP 150/90 | HR 73 | Temp 98.5°F | Wt 236.0 lb

## 2014-10-10 DIAGNOSIS — I10 Essential (primary) hypertension: Secondary | ICD-10-CM

## 2014-10-10 DIAGNOSIS — D509 Iron deficiency anemia, unspecified: Secondary | ICD-10-CM

## 2014-10-10 DIAGNOSIS — F4321 Adjustment disorder with depressed mood: Secondary | ICD-10-CM | POA: Diagnosis not present

## 2014-10-10 DIAGNOSIS — M159 Polyosteoarthritis, unspecified: Secondary | ICD-10-CM | POA: Diagnosis not present

## 2014-10-10 DIAGNOSIS — E538 Deficiency of other specified B group vitamins: Secondary | ICD-10-CM | POA: Diagnosis not present

## 2014-10-10 LAB — BASIC METABOLIC PANEL
BUN: 20 mg/dL (ref 6–23)
CO2: 31 mEq/L (ref 19–32)
Calcium: 9.7 mg/dL (ref 8.4–10.5)
Chloride: 104 mEq/L (ref 96–112)
Creatinine, Ser: 0.77 mg/dL (ref 0.40–1.50)
GFR: 105.09 mL/min (ref >60.00)
Glucose, Bld: 97 mg/dL (ref 70–99)
Potassium: 4.5 mEq/L (ref 3.5–5.1)
Sodium: 138 mEq/L (ref 135–145)

## 2014-10-10 LAB — CBC WITH DIFFERENTIAL/PLATELET
Basophils Absolute: 0.1 10*3/uL (ref 0.0–0.1)
Basophils Relative: 1.2 % (ref 0.0–3.0)
Eosinophils Absolute: 0.2 10*3/uL (ref 0.0–0.7)
Eosinophils Relative: 4.1 % (ref 0.0–5.0)
HCT: 36.8 % — ABNORMAL LOW (ref 39.0–52.0)
Hemoglobin: 12.2 g/dL — ABNORMAL LOW (ref 13.0–17.0)
Lymphocytes Relative: 26.8 % (ref 12.0–46.0)
Lymphs Abs: 1.1 10*3/uL (ref 0.7–4.0)
MCHC: 33.1 g/dL (ref 30.0–36.0)
MCV: 81.1 fl (ref 78.0–100.0)
Monocytes Absolute: 0.8 10*3/uL (ref 0.1–1.0)
Monocytes Relative: 19.4 % — ABNORMAL HIGH (ref 3.0–12.0)
Neutro Abs: 1.9 10*3/uL (ref 1.4–7.7)
Neutrophils Relative %: 48.5 % (ref 43.0–77.0)
Platelets: 237 10*3/uL (ref 150.0–400.0)
RBC: 4.53 Mil/uL (ref 4.22–5.81)
RDW: 16.1 % — ABNORMAL HIGH (ref 11.5–15.5)
WBC: 4 10*3/uL (ref 4.0–10.5)

## 2014-10-10 LAB — IBC PANEL
Iron: 47 ug/dL (ref 42–165)
Saturation Ratios: 10.5 % — ABNORMAL LOW (ref 20.0–50.0)
Transferrin: 321 mg/dL (ref 212.0–360.0)

## 2014-10-10 MED ORDER — TADALAFIL 20 MG PO TABS
20.0000 mg | ORAL_TABLET | Freq: Every day | ORAL | Status: DC | PRN
Start: 2014-10-10 — End: 2015-08-13

## 2014-10-10 NOTE — Assessment & Plan Note (Signed)
Handicapped form filled out Continue with current non-prescription therapy as reflected on the Med list.

## 2014-10-10 NOTE — Assessment & Plan Note (Signed)
Better  

## 2014-10-10 NOTE — Assessment & Plan Note (Signed)
Continue with current prescription therapy as reflected on the Med list.  

## 2014-10-10 NOTE — Assessment & Plan Note (Signed)
BP Readings from Last 3 Encounters:  10/10/14 150/90  07/10/14 122/74  07/03/14 117/60

## 2014-10-10 NOTE — Progress Notes (Signed)
   Subjective:    HPI  [name] is getting married on 12/23/14  F/u anemia with Hgb 7.0 prior, last Hgb - 13 F/u weakness in B thighs when walking x 3-4 months; they feel "tired" - better. No LBP. No leg pain. C/o fatigue... The patient presents for a follow-up of  chronic hypertension, chronic dyslipidemia, CAD controlled with medicines.  C/o ED, OA    Wt Readings from Last 3 Encounters:  10/10/14 236 lb (107.049 kg)  07/10/14 233 lb (105.688 kg)  07/03/14 236 lb 12.8 oz (107.412 kg)   BP Readings from Last 3 Encounters:  10/10/14 150/90  07/10/14 122/74  07/03/14 117/60      Review of Systems  Constitutional: Negative for appetite change, fatigue and unexpected weight change.  HENT: Negative for nosebleeds and trouble swallowing.   Eyes: Negative for itching and visual disturbance.  Respiratory: Positive for chest tightness.   Cardiovascular: Negative for palpitations and leg swelling.  Gastrointestinal: Negative for blood in stool and abdominal distention.  Genitourinary: Negative for frequency and hematuria.  Musculoskeletal: Positive for back pain and arthralgias. Negative for joint swelling and gait problem.  Neurological: Negative for dizziness, tremors, speech difficulty and weakness.  Psychiatric/Behavioral: Negative for suicidal ideas, sleep disturbance, dysphoric mood and agitation. The patient is not nervous/anxious.        Objective:   Physical Exam  Constitutional: He is oriented to person, place, and time. He appears well-developed. No distress.  NAD  HENT:  Mouth/Throat: Oropharynx is clear and moist.  Eyes: Conjunctivae are normal. Pupils are equal, round, and reactive to light.  Neck: Normal range of motion. No JVD present. No thyromegaly present.  Cardiovascular: Normal rate, regular rhythm, normal heart sounds and intact distal pulses.  Exam reveals no gallop and no friction rub.   No murmur heard. Pulmonary/Chest: Effort normal and breath sounds  normal. No respiratory distress. He has no wheezes. He has no rales. He exhibits no tenderness.  Abdominal: Soft. Bowel sounds are normal. He exhibits no distension and no mass. There is no tenderness. There is no rebound and no guarding.  Musculoskeletal: Normal range of motion. He exhibits no edema or tenderness.  Lymphadenopathy:    He has no cervical adenopathy.  Neurological: He is alert and oriented to person, place, and time. He has normal reflexes. No cranial nerve deficit. He exhibits normal muscle tone. He displays a negative Romberg sign. Coordination and gait normal.  No meningeal signs  Skin: Skin is warm and dry. No rash noted.  Psychiatric: He has a normal mood and affect. His behavior is normal. Judgment and thought content normal.  Tanned, not  pale    Lab Results  Component Value Date   WBC 5.8 06/26/2014   HGB 13.1 06/26/2014   HCT 40.4 06/26/2014   PLT 200.0 06/26/2014   GLUCOSE 98 05/16/2014   CHOL 162 11/07/2013   TRIG 122.0 11/07/2013   HDL 57.30 11/07/2013   LDLDIRECT 157.5 07/05/2008   LDLCALC 80 11/07/2013   ALT 9 05/16/2014   AST 19 05/16/2014   NA 140 05/16/2014   K 3.8 05/16/2014   CL 103 03/06/2014   CREATININE 0.9 05/16/2014   BUN 17.6 05/16/2014   CO2 28 05/16/2014   TSH 1.290 02/21/2014   PSA 0.49 03/01/2012   INR 1.18 02/22/2014        Assessment & Plan:

## 2014-10-10 NOTE — Assessment & Plan Note (Signed)
CBC

## 2014-10-10 NOTE — Progress Notes (Signed)
Pre visit review using our clinic review tool, if applicable. No additional management support is needed unless otherwise documented below in the visit note. 

## 2014-10-25 ENCOUNTER — Telehealth: Payer: Self-pay | Admitting: Neurology

## 2014-10-25 ENCOUNTER — Ambulatory Visit (INDEPENDENT_AMBULATORY_CARE_PROVIDER_SITE_OTHER): Payer: Medicare Other | Admitting: Internal Medicine

## 2014-10-25 ENCOUNTER — Encounter: Payer: Self-pay | Admitting: Internal Medicine

## 2014-10-25 VITALS — BP 140/80 | HR 62 | Temp 97.5°F | Wt 231.2 lb

## 2014-10-25 DIAGNOSIS — I10 Essential (primary) hypertension: Secondary | ICD-10-CM

## 2014-10-25 DIAGNOSIS — E538 Deficiency of other specified B group vitamins: Secondary | ICD-10-CM | POA: Diagnosis not present

## 2014-10-25 DIAGNOSIS — R202 Paresthesia of skin: Secondary | ICD-10-CM | POA: Insufficient documentation

## 2014-10-25 DIAGNOSIS — M5440 Lumbago with sciatica, unspecified side: Secondary | ICD-10-CM

## 2014-10-25 MED ORDER — GABAPENTIN 100 MG PO CAPS: 100.0000 mg | ORAL_CAPSULE | Freq: Three times a day (TID) | ORAL | Status: DC | PRN

## 2014-10-25 NOTE — Assessment & Plan Note (Signed)
Doing well 

## 2014-10-25 NOTE — Assessment & Plan Note (Signed)
On Vit B12 Rx

## 2014-10-25 NOTE — Progress Notes (Signed)
Pre visit review using our clinic review tool, if applicable. No additional management support is needed unless otherwise documented below in the visit note. 

## 2014-10-25 NOTE — Assessment & Plan Note (Signed)
3/16 L face and neck ?occipital nerve paresthesia  Gabapentin low dose prn Occip block discussed as an option Neurol ref

## 2014-10-25 NOTE — Telephone Encounter (Signed)
Pt r/s his NP appr from 11/24/14 to 11/28/14. Dr. Plonikov/referring provider was notified.

## 2014-10-25 NOTE — Assessment & Plan Note (Signed)
On diet - NAS 

## 2014-10-25 NOTE — Progress Notes (Signed)
Subjective:    HPI  [name] is getting married on 12/23/14  C/o "sensation" on the L cheek and L neck and upper shoulder - "numbness, tickling" x 1 min many times a day x 1+ month. No pain, no rash.  F/u anemia with Hgb 7.0 prior, last Hgb - 13 F/u weakness in B thighs when walking x 3-4 months; they feel "tired" - better. No LBP. No leg pain. C/o fatigue... The patient presents for a follow-up of  chronic hypertension, chronic dyslipidemia, CAD controlled with medicines.  C/o ED, OA    Wt Readings from Last 3 Encounters:  10/25/14 231 lb 4 oz (104.894 kg)  10/10/14 236 lb (107.049 kg)  07/10/14 233 lb (105.688 kg)   BP Readings from Last 3 Encounters:  10/25/14 140/80  10/10/14 150/90  07/10/14 122/74      Review of Systems  Constitutional: Negative for appetite change, fatigue and unexpected weight change.  HENT: Negative for nosebleeds and trouble swallowing.   Eyes: Negative for itching and visual disturbance.  Respiratory: Positive for chest tightness.   Cardiovascular: Negative for palpitations and leg swelling.  Gastrointestinal: Negative for blood in stool and abdominal distention.  Genitourinary: Negative for frequency and hematuria.  Musculoskeletal: Positive for back pain and arthralgias. Negative for joint swelling and gait problem.  Neurological: Negative for dizziness, tremors, speech difficulty and weakness.  Psychiatric/Behavioral: Negative for suicidal ideas, sleep disturbance, dysphoric mood and agitation. The patient is not nervous/anxious.        Objective:   Physical Exam  Constitutional: He is oriented to person, place, and time. He appears well-developed. No distress.  NAD  HENT:  Mouth/Throat: Oropharynx is clear and moist.  Eyes: Conjunctivae are normal. Pupils are equal, round, and reactive to light.  Neck: Normal range of motion. No JVD present. No thyromegaly present.  Cardiovascular: Normal rate, regular rhythm, normal heart sounds and  intact distal pulses.  Exam reveals no gallop and no friction rub.   No murmur heard. Pulmonary/Chest: Effort normal and breath sounds normal. No respiratory distress. He has no wheezes. He has no rales. He exhibits no tenderness.  Abdominal: Soft. Bowel sounds are normal. He exhibits no distension and no mass. There is no tenderness. There is no rebound and no guarding.  Musculoskeletal: Normal range of motion. He exhibits no edema or tenderness.  Lymphadenopathy:    He has no cervical adenopathy.  Neurological: He is alert and oriented to person, place, and time. He has normal reflexes. No cranial nerve deficit. He exhibits normal muscle tone. He displays a negative Romberg sign. Coordination and gait normal.  No meningeal signs  Skin: Skin is warm and dry. No rash noted.  Psychiatric: He has a normal mood and affect. His behavior is normal. Judgment and thought content normal.  Tanned, not  pale  L occip nerve origin is tender    Lab Results  Component Value Date   WBC 4.0 10/10/2014   HGB 12.2* 10/10/2014   HCT 36.8* 10/10/2014   PLT 237.0 10/10/2014   GLUCOSE 97 10/10/2014   CHOL 162 11/07/2013   TRIG 122.0 11/07/2013   HDL 57.30 11/07/2013   LDLDIRECT 157.5 07/05/2008   LDLCALC 80 11/07/2013   ALT 9 05/16/2014   AST 19 05/16/2014   NA 138 10/10/2014   K 4.5 10/10/2014   CL 104 10/10/2014   CREATININE 0.77 10/10/2014   BUN 20 10/10/2014   CO2 31 10/10/2014   TSH 1.290 02/21/2014   PSA 0.49 03/01/2012  INR 1.18 02/22/2014        Assessment & Plan:

## 2014-11-11 DIAGNOSIS — R0602 Shortness of breath: Secondary | ICD-10-CM | POA: Diagnosis not present

## 2014-11-11 DIAGNOSIS — R062 Wheezing: Secondary | ICD-10-CM | POA: Diagnosis not present

## 2014-11-11 DIAGNOSIS — J22 Unspecified acute lower respiratory infection: Secondary | ICD-10-CM | POA: Diagnosis not present

## 2014-11-13 DIAGNOSIS — R0602 Shortness of breath: Secondary | ICD-10-CM | POA: Diagnosis not present

## 2014-11-13 DIAGNOSIS — R062 Wheezing: Secondary | ICD-10-CM | POA: Diagnosis not present

## 2014-11-13 DIAGNOSIS — J22 Unspecified acute lower respiratory infection: Secondary | ICD-10-CM | POA: Diagnosis not present

## 2014-11-24 ENCOUNTER — Ambulatory Visit: Payer: Medicare Other | Admitting: Neurology

## 2014-11-28 ENCOUNTER — Encounter: Payer: Self-pay | Admitting: Neurology

## 2014-11-28 ENCOUNTER — Ambulatory Visit (INDEPENDENT_AMBULATORY_CARE_PROVIDER_SITE_OTHER): Payer: Medicare Other | Admitting: Neurology

## 2014-11-28 VITALS — BP 130/80 | HR 59 | Ht 72.0 in | Wt 232.0 lb

## 2014-11-28 DIAGNOSIS — R29898 Other symptoms and signs involving the musculoskeletal system: Secondary | ICD-10-CM

## 2014-11-28 DIAGNOSIS — H02402 Unspecified ptosis of left eyelid: Secondary | ICD-10-CM | POA: Diagnosis not present

## 2014-11-28 DIAGNOSIS — R209 Unspecified disturbances of skin sensation: Secondary | ICD-10-CM | POA: Diagnosis not present

## 2014-11-28 DIAGNOSIS — R202 Paresthesia of skin: Secondary | ICD-10-CM

## 2014-11-28 DIAGNOSIS — M542 Cervicalgia: Secondary | ICD-10-CM | POA: Diagnosis not present

## 2014-11-28 NOTE — Patient Instructions (Addendum)
1.  MRI brain and cervical spine wo contrast 2.  Continue gabapentin 100mg  qhs, ok to titirate as needed 3.  Telephone update with results

## 2014-11-28 NOTE — Progress Notes (Signed)
[facility] Clinic Note - Initial Visit   Date: 11/28/2014   [name] MRN: [id number] DOB: October 03, 1940   Dear Dr. Alain Marion:  Thank you for your kind referral of [name] for consultation of left sided tingling of the face and neck. Although his history is well known to you, please allow [place] to reiterate it for the purpose of our medical record. The patient was accompanied to the clinic by fiance.     History of Present Illness: [name] is a 74 y.o. right-handed Caucasian male with hypertension, anemia, hyperlipidemia, CAD, lumbar fusion at L3-L5 (2007), and BPH presenting for evaluation of left sided tingling over the face and neck.    Early 2016, he developed intermittent tingling over the left side of his face, neck, and shoulder.  Tingling sensation only involves the left jaw without involvement into his cheek, forehead, or ear.  It lasts about 1-2 minutes and was occuring many times per day.  Since starting gabapentin, it now occurs 1-2 times per day and lasts less than a minute.  He denies any associated headache, changes in vision/taste/hearing, neck pain, or facial weakness.  He was started on gabapentin 100mg  TID and has since reduced it to once daily because he is doing well.    He reports having chronic weakness of the left shoulder after having shoulder surgery.  Out-side paper records, electronic medical record, and images have been reviewed where available and summarized as:  MRI cervical spine wo contrast 02/24/2008: Baseline narrowed spinal canal with superimposed degenerative changes contributes to spinal stenosis and foraminal narrowing at several levels throughout the cervical spine and upper thoracic spine as discussed above. It is difficult to state which level may be responsible for the patient's symptoms as findings are significant at several levels.   MRI lumbar spine wwo contrast 05/14/2006: 1. Mild to  moderate right sided disc protrusion at L1-2 is stable.  2. Post-op changes on the right at L3-4 with spondylosis and right foraminal encroachment and mild spinal stenosis. No recurrent disc protrusion.  3. Post-op changes on the left at L4-5 with left foraminal encroachment due to the spurring.  4. Left foraminal encroachment at L5-S1 due to disc bulging and spurring.   Lab Results  Component Value Date   TSH 1.290 02/21/2014   Lab Results  Component Value Date   [id number] 05/16/2014   Lab Results  Component Value Date   FERRITIN 79 06/26/2014     Past Medical History  Diagnosis Date  . Hyperlipidemia   . Osteoarthritis   . LBP (low back pain)   . Insomnia   . CAD (coronary artery disease) 2000    Dr Gay Filler test negative  . OSA (obstructive sleep apnea)     lost 40 lb-does not snore-wife says he is not apnic now  . BPH (benign prostatic hyperplasia)   . CTS (carpal tunnel syndrome)     better  . Sleep apnea     no cpap  . Blood transfusion without reported diagnosis 02/23/14    3 units for iron deficiency anemia    Past Surgical History  Procedure Laterality Date  . Total knee arthroplasty  2003    Right  . Lumbar fusion  2007    [place], Dr Vertell Limber  . Carpal tunnel release Bilateral 1996, 2004    Dr Lenoard Aden thumb surgery.   . Tonsillectomy  1946  . Eye surgery  2012    both cataracts, Lasik  .  Colonoscopy  2005, 2012    diverticulosis.   . Esophagogastroduodenoscopy N/A 02/23/2014    Procedure: ESOPHAGOGASTRODUODENOSCOPY (EGD);  Surgeon: Irene Shipper, MD;  Location: [facility] ENDOSCOPY;  Service: Endoscopy;  Laterality: N/A;     Medications:  Current Outpatient Prescriptions on File Prior to Visit  Medication Sig Dispense Refill  . aspirin 81 MG chewable tablet Chew 81 mg by mouth at bedtime.    . Cyanocobalamin (VITAMIN B-12) 1000 MCG SUBL Place 1 tablet (1,000 mcg total) under the tongue daily. 100 tablet 3  . DULoxetine (CYMBALTA) 60 MG  capsule Take 1 capsule (60 mg total) by mouth daily. 90 capsule 3  . finasteride (PROSCAR) 5 MG tablet TAKE 1 TABLET (5 MG TOTAL) BY MOUTH DAILY. 90 tablet 3  . folic acid (V-R FOLIC ACID) 409 MCG tablet Take 1 tablet (400 mcg total) by mouth daily. 90 tablet 3  . furosemide (LASIX) 20 MG tablet Take 1-2 tablets (20-40 mg total) by mouth daily as needed for edema. 60 tablet 5  . gabapentin (NEURONTIN) 100 MG capsule Take 1 capsule (100 mg total) by mouth 3 (three) times daily as needed (tingling). 90 capsule 3  . iron polysaccharides (NIFEREX) 150 MG capsule Take 1 capsule (150 mg total) by mouth 3 (three) times daily. 90 capsule 5  . naproxen sodium (ANAPROX) 220 MG tablet Take 220 mg by mouth daily as needed (pain).    . simvastatin (ZOCOR) 40 MG tablet Take 40 mg by mouth at bedtime.    . tadalafil (CIALIS) 20 MG tablet Take 1 tablet (20 mg total) by mouth daily as needed for erectile dysfunction. 10 tablet 1  . tamsulosin (FLOMAX) 0.4 MG CAPS capsule Take 1 capsule (0.4 mg total) by mouth daily. 90 capsule 3   No current facility-administered medications on file prior to visit.    Allergies: No Known Allergies  Family History: Family History  Problem Relation Age of Onset  . Aneurysm Father     AAA  . Heart disease Father   . Cancer Brother   . Mental illness Brother     dementia  . Stomach cancer Brother   . Coronary artery disease Other   . Breast cancer Other   . Cancer Mother 82    breast cancer  . Heart disease Mother     MI  . Colon cancer Neg Hx   . Esophageal cancer Neg Hx   . Pancreatic cancer Neg Hx   . Prostate cancer Neg Hx   . Rectal cancer Neg Hx   . Autoimmune disease Son     Social History: History   Social History  . Marital Status: Widowed    Spouse Name: N/A  . Number of Children: N/A  . Years of Education: N/A   Occupational History  . Retired    Social History Main Topics  . Smoking status: Former Smoker    Quit date: 01/08/1987  .  Smokeless tobacco: Never Used  . Alcohol Use: Yes     Comment: rare  . Drug Use: No  . Sexual Activity: Not Currently   Other Topics Concern  . Not on file   Social History Narrative   He lives with wife.   Retired Dealer.   Highest level of education:  High school   Regular Exercise -  YES    Review of Systems:  CONSTITUTIONAL: No fevers, chills, night sweats, or weight loss.   EYES: No visual changes or eye pain ENT: No hearing changes.  No history of nose bleeds.   RESPIRATORY: No cough, wheezing and shortness of breath.   CARDIOVASCULAR: Negative for chest pain, and palpitations.   GI: Negative for abdominal discomfort, blood in stools or black stools.  No recent change in bowel habits.   GU:  No history of incontinence.   MUSCLOSKELETAL: No history of joint pain or swelling.  No myalgias.   SKIN: Negative for lesions, rash, and itching.   HEMATOLOGY/ONCOLOGY: Negative for prolonged bleeding, bruising easily, and swollen nodes.  No history of cancer.   ENDOCRINE: Negative for cold or heat intolerance, polydipsia or goiter.   PSYCH:  No depression or anxiety symptoms.   NEURO: As Above.   Vital Signs:  BP 130/80 mmHg  Pulse 59  Ht 6' (1.829 m)  Wt 232 lb (105.235 kg)  BMI 31.46 kg/m2  SpO2 97%   General Medical Exam:   General:  Well appearing, comfortable.   Eyes/ENT: see cranial nerve examination.   Neck: No masses appreciated.  Full range of motion without tenderness.  No carotid bruits. Respiratory:  Clear to auscultation, good air entry bilaterally.   Cardiac:  Regular rate and rhythm, no murmur.   Extremities:  No deformities, edema, or skin discoloration.  Skin:  No rashes or lesions.  Neurological Exam: MENTAL STATUS including orientation to time, place, person, recent and remote memory, attention span and concentration, language, and fund of knowledge is normal.  Speech is not dysarthric.  CRANIAL NERVES: II:  No visual field defects.  Unremarkable  fundi.   III-IV-VI: Pupils equal round and reactive to light.  Normal conjugate, extra-ocular eye movements in all directions of gaze.  No nystagmus.  Subtle left ptosis (old).   V:  Normal facial sensation.  Jaw jerk is absent.   VII:  Normal facial symmetry and movements.  No pathologic facial reflexes.  VIII:  Normal hearing and vestibular function.   IX-X:  Normal palatal movement.   XI:  Normal shoulder shrug and head rotation.   XII:  Normal tongue strength and range of motion, no deviation or fasciculation.  MOTOR:  No atrophy, fasciculations or abnormal movements.  No pronator drift.  Tone is normal.    Right Upper Extremity:    Left Upper Extremity:    Deltoid  5/5   Deltoid  5/5   Biceps  5/5   Biceps  5/5   Infraspinatus 5/5  Infraspinatus 4/5  Medial pectoralsi 5/5  Medial pectoralis  5/5  Triceps  5/5   Triceps  5/5   Wrist extensors  5/5   Wrist extensors  5/5   Wrist flexors  5/5   Wrist flexors  5/5   Finger extensors  5/5   Finger extensors  5/5   Finger flexors  5/5   Finger flexors  5/5   Dorsal interossei  5/5   Dorsal interossei  5/5   Abductor pollicis  5/5   Abductor pollicis  5/5   Tone (Ashworth scale)  0  Tone (Ashworth scale)  0   Right Lower Extremity:    Left Lower Extremity:    Hip flexors  5/5   Hip flexors  5/5   Hip extensors  5/5   Hip extensors  5/5   Knee flexors  5/5   Knee flexors  5/5   Knee extensors  5/5   Knee extensors  5/5   Dorsiflexors  5/5   Dorsiflexors  5/5   Plantarflexors  5/5   Plantarflexors  5/5   Toe  extensors  5/5   Toe extensors  5/5   Toe flexors  5/5   Toe flexors  5/5   Tone (Ashworth scale)  0  Tone (Ashworth scale)  0   MSRs:  Right                                                                 Left brachioradialis 2+  brachioradialis 2+  biceps 2+  biceps 2+  triceps 2+  triceps 2+  patellar 2+  patellar 2+  ankle jerk 2+  ankle jerk 2+  Hoffman no  Hoffman no  plantar response down  plantar response down    SENSORY:  Normal and symmetric perception of light touch, pinprick, vibration, and proprioception.  Romberg's sign absent.   COORDINATION/GAIT: Normal finger-to- nose-finger and heel-to-shin.  Intact rapid alternating movements bilaterally.  Able to rise from a chair without using arms.  Gait narrow based and stable. Tandem and stressed gait intact.    IMPRESSION: Mr. Schupp is a 74 year-old gentleman presenting for evaluation of left facial and arm paresthesias.  His exam is notable for subtle left ptosis (old) and infraspinatus weakness (old), otherwise exam is non-focal.  I cannot explain his neuralgia based on a cutaneous or dermatomal distribution given the atypical distribution involving both the face and the neck/shoulder, so imaging of the brain and neck will be done. Previous MRI cervical spine from 2009 was reviewed and does show multilevel degenerative changes of the spine, however this would not manifest with facial paresthesias unless there was a very high cervical cord lesion. Overall, my suspicion for something worrisome is low.      PLAN/RECOMMENDATIONS:  1.  MRI brain and cervical spine wo contrast 2.  Continue gabapentin 100mg  qhs, ok to titirate as needed 3.  Telephone update with results  The duration of this appointment visit was 45 minutes of face-to-face time with the patient.  Greater than 50% of this time was spent in counseling, explanation of diagnosis, planning of further management, and coordination of care.   Thank you for allowing me to participate in patient's care.  If I can answer any additional questions, I would be pleased to do so.    Sincerely,    Banjamin Stovall K. Posey Pronto, DO

## 2014-11-30 ENCOUNTER — Telehealth: Payer: Self-pay | Admitting: Internal Medicine

## 2014-11-30 MED ORDER — TAMSULOSIN HCL 0.4 MG PO CAPS: 0.4000 mg | ORAL_CAPSULE | Freq: Every day | ORAL | Status: DC

## 2014-11-30 NOTE — Telephone Encounter (Signed)
Rf sent. Pt informed  

## 2014-11-30 NOTE — Telephone Encounter (Signed)
Pt recently moved and has misplaced his Tamulosin and is requesting a refill.  Pt uses [facility] to call [name] and speak with her regarding this per pt.

## 2014-12-14 ENCOUNTER — Ambulatory Visit (HOSPITAL_COMMUNITY): Payer: Medicare Other

## 2014-12-14 ENCOUNTER — Ambulatory Visit (HOSPITAL_COMMUNITY)
Admission: RE | Admit: 2014-12-14 | Discharge: 2014-12-14 | Disposition: A | Discharge status: Home / Self Care | Payer: Medicare Other | Source: Ambulatory Visit | Attending: Neurology | Admitting: Neurology

## 2014-12-14 DIAGNOSIS — R202 Paresthesia of skin: Secondary | ICD-10-CM

## 2014-12-14 DIAGNOSIS — R209 Unspecified disturbances of skin sensation: Secondary | ICD-10-CM

## 2014-12-14 DIAGNOSIS — M542 Cervicalgia: Secondary | ICD-10-CM

## 2014-12-14 DIAGNOSIS — M5023 Other cervical disc displacement, cervicothoracic region: Secondary | ICD-10-CM | POA: Diagnosis not present

## 2014-12-14 DIAGNOSIS — M4802 Spinal stenosis, cervical region: Secondary | ICD-10-CM | POA: Diagnosis not present

## 2014-12-14 DIAGNOSIS — R29898 Other symptoms and signs involving the musculoskeletal system: Secondary | ICD-10-CM

## 2014-12-14 DIAGNOSIS — M5022 Other cervical disc displacement, mid-cervical region: Secondary | ICD-10-CM | POA: Diagnosis not present

## 2014-12-14 DIAGNOSIS — R531 Weakness: Secondary | ICD-10-CM | POA: Diagnosis not present

## 2014-12-14 DIAGNOSIS — M2578 Osteophyte, vertebrae: Secondary | ICD-10-CM | POA: Diagnosis not present

## 2014-12-15 ENCOUNTER — Telehealth: Payer: Self-pay | Admitting: Neurology

## 2014-12-15 NOTE — Telephone Encounter (Signed)
Called and discussed patient MRI brain and c-spine results.  Multilevel spondylosis with canal stenosis with mass effect on the spinal cord, as well as foraminal stenosis which is causing neck discomfort and arm paresthesias and weakness.  He is doing well at this time without discomfort.  PT was declined.  Spine surgery evaluation was discussed if symptoms worsen, which he will think about and contact us as needed.  [name] K. Posey Pronto, DO

## 2015-01-04 ENCOUNTER — Other Ambulatory Visit: Payer: Self-pay | Admitting: *Deleted

## 2015-01-04 MED ORDER — DULOXETINE HCL 60 MG PO CPEP: 60.0000 mg | ORAL_CAPSULE | Freq: Every day | ORAL | Status: DC

## 2015-01-05 ENCOUNTER — Ambulatory Visit: Payer: Medicare Other | Admitting: Internal Medicine

## 2015-01-11 ENCOUNTER — Ambulatory Visit: Payer: Medicare Other | Admitting: Internal Medicine

## 2015-01-11 ENCOUNTER — Other Ambulatory Visit: Payer: Self-pay | Admitting: *Deleted

## 2015-01-11 MED ORDER — GABAPENTIN 100 MG PO CAPS: 100.0000 mg | ORAL_CAPSULE | Freq: Three times a day (TID) | ORAL | Status: DC | PRN

## 2015-01-30 ENCOUNTER — Ambulatory Visit (INDEPENDENT_AMBULATORY_CARE_PROVIDER_SITE_OTHER): Payer: Medicare Other | Admitting: Internal Medicine

## 2015-01-30 ENCOUNTER — Other Ambulatory Visit (INDEPENDENT_AMBULATORY_CARE_PROVIDER_SITE_OTHER): Payer: Medicare Other

## 2015-01-30 ENCOUNTER — Encounter: Payer: Self-pay | Admitting: Internal Medicine

## 2015-01-30 VITALS — BP 110/70 | HR 73 | Wt 236.0 lb

## 2015-01-30 DIAGNOSIS — N5201 Erectile dysfunction due to arterial insufficiency: Secondary | ICD-10-CM

## 2015-01-30 DIAGNOSIS — E538 Deficiency of other specified B group vitamins: Secondary | ICD-10-CM | POA: Diagnosis not present

## 2015-01-30 DIAGNOSIS — D509 Iron deficiency anemia, unspecified: Secondary | ICD-10-CM | POA: Diagnosis not present

## 2015-01-30 DIAGNOSIS — I2510 Atherosclerotic heart disease of native coronary artery without angina pectoris: Secondary | ICD-10-CM

## 2015-01-30 DIAGNOSIS — I10 Essential (primary) hypertension: Secondary | ICD-10-CM | POA: Diagnosis not present

## 2015-01-30 DIAGNOSIS — R202 Paresthesia of skin: Secondary | ICD-10-CM

## 2015-01-30 LAB — CBC WITH DIFFERENTIAL/PLATELET
Basophils Absolute: 0 10*3/uL (ref 0.0–0.1)
Basophils Relative: 0.8 % (ref 0.0–3.0)
Eosinophils Absolute: 0.2 10*3/uL (ref 0.0–0.7)
Eosinophils Relative: 4.4 % (ref 0.0–5.0)
HCT: 37.5 % — ABNORMAL LOW (ref 39.0–52.0)
Hemoglobin: 12.2 g/dL — ABNORMAL LOW (ref 13.0–17.0)
Lymphocytes Relative: 34.4 % (ref 12.0–46.0)
Lymphs Abs: 1.4 10*3/uL (ref 0.7–4.0)
MCHC: 32.4 g/dL (ref 30.0–36.0)
MCV: 80.7 fl (ref 78.0–100.0)
Monocytes Absolute: 0.7 10*3/uL (ref 0.1–1.0)
Monocytes Relative: 17.6 % — ABNORMAL HIGH (ref 3.0–12.0)
Neutro Abs: 1.7 10*3/uL (ref 1.4–7.7)
Neutrophils Relative %: 42.8 % — ABNORMAL LOW (ref 43.0–77.0)
Platelets: 235 10*3/uL (ref 150.0–400.0)
RBC: 4.65 Mil/uL (ref 4.22–5.81)
RDW: 17 % — ABNORMAL HIGH (ref 11.5–15.5)
WBC: 4.1 10*3/uL (ref 4.0–10.5)

## 2015-01-30 LAB — BASIC METABOLIC PANEL
BUN: 13 mg/dL (ref 6–23)
CO2: 30 mEq/L (ref 19–32)
Calcium: 9.6 mg/dL (ref 8.4–10.5)
Chloride: 104 mEq/L (ref 96–112)
Creatinine, Ser: 0.77 mg/dL (ref 0.40–1.50)
GFR: 105 mL/min (ref >60.00)
Glucose, Bld: 101 mg/dL — ABNORMAL HIGH (ref 70–99)
Potassium: 4.4 mEq/L (ref 3.5–5.1)
Sodium: 139 mEq/L (ref 135–145)

## 2015-01-30 NOTE — Assessment & Plan Note (Signed)
NAS diet 

## 2015-01-30 NOTE — Progress Notes (Signed)
Subjective:    HPI  [name] is got married on 12/23/14  F/u "sensation" on the L cheek and L neck and upper shoulder - "numbness, tickling" x 1 min many times a day  - resolved. Pt is down to 1 gabapentin a day. No pain, no rash.  F/u anemia with Hgb 7.0 prior, last Hgb - 13 F/u weakness in B thighs when walking x 3-4 months; they feel "tired" - better. No LBP. No leg pain. C/o fatigue... The patient presents for a follow-up of  chronic hypertension, chronic dyslipidemia, CAD controlled with medicines.  F/u ED, OA    Wt Readings from Last 3 Encounters:  01/30/15 236 lb (107.049 kg)  11/28/14 232 lb (105.235 kg)  10/25/14 231 lb 4 oz (104.894 kg)   BP Readings from Last 3 Encounters:  01/30/15 110/70  11/28/14 130/80  10/25/14 140/80      Review of Systems  Constitutional: Negative for appetite change, fatigue and unexpected weight change.  HENT: Negative for nosebleeds and trouble swallowing.   Eyes: Negative for itching and visual disturbance.  Respiratory: Positive for chest tightness.   Cardiovascular: Negative for palpitations and leg swelling.  Gastrointestinal: Negative for blood in stool and abdominal distention.  Genitourinary: Negative for frequency and hematuria.  Musculoskeletal: Positive for back pain and arthralgias. Negative for joint swelling and gait problem.  Neurological: Negative for dizziness, tremors, speech difficulty and weakness.  Psychiatric/Behavioral: Negative for suicidal ideas, sleep disturbance, dysphoric mood and agitation. The patient is not nervous/anxious.        Objective:   Physical Exam  Constitutional: He is oriented to person, place, and time. He appears well-developed. No distress.  NAD  HENT:  Mouth/Throat: Oropharynx is clear and moist.  Eyes: Conjunctivae are normal. Pupils are equal, round, and reactive to light.  Neck: Normal range of motion. No JVD present. No thyromegaly present.  Cardiovascular: Normal rate, regular  rhythm, normal heart sounds and intact distal pulses.  Exam reveals no gallop and no friction rub.   No murmur heard. Pulmonary/Chest: Effort normal and breath sounds normal. No respiratory distress. He has no wheezes. He has no rales. He exhibits no tenderness.  Abdominal: Soft. Bowel sounds are normal. He exhibits no distension and no mass. There is no tenderness. There is no rebound and no guarding.  Musculoskeletal: Normal range of motion. He exhibits no edema or tenderness.  Lymphadenopathy:    He has no cervical adenopathy.  Neurological: He is alert and oriented to person, place, and time. He has normal reflexes. No cranial nerve deficit. He exhibits normal muscle tone. He displays a negative Romberg sign. Coordination and gait normal.  No meningeal signs  Skin: Skin is warm and dry. No rash noted.  Psychiatric: He has a normal mood and affect. His behavior is normal. Judgment and thought content normal.  Tanned, not  pale  L occip nerve origin is less tender    Lab Results  Component Value Date   WBC 4.0 10/10/2014   HGB 12.2* 10/10/2014   HCT 36.8* 10/10/2014   PLT 237.0 10/10/2014   GLUCOSE 97 10/10/2014   CHOL 162 11/07/2013   TRIG 122.0 11/07/2013   HDL 57.30 11/07/2013   LDLDIRECT 157.5 07/05/2008   LDLCALC 80 11/07/2013   ALT 9 05/16/2014   AST 19 05/16/2014   NA 138 10/10/2014   K 4.5 10/10/2014   CL 104 10/10/2014   CREATININE 0.77 10/10/2014   BUN 20 10/10/2014   CO2 31 10/10/2014  TSH 1.290 02/21/2014   PSA 0.49 03/01/2012   INR 1.18 02/22/2014        Assessment & Plan:

## 2015-01-30 NOTE — Assessment & Plan Note (Signed)
ASA, Simvastatin 

## 2015-01-30 NOTE — Progress Notes (Signed)
Pre visit review using our clinic review tool, if applicable. No additional management support is needed unless otherwise documented below in the visit note. 

## 2015-01-30 NOTE — Assessment & Plan Note (Signed)
On B12 

## 2015-01-30 NOTE — Assessment & Plan Note (Signed)
CBC

## 2015-01-30 NOTE — Assessment & Plan Note (Signed)
6/15 refractory Cialis prn - not helping Urol ref

## 2015-01-30 NOTE — Assessment & Plan Note (Signed)
Resolved - d/c gabapentin

## 2015-01-31 ENCOUNTER — Encounter: Payer: Self-pay | Admitting: Internal Medicine

## 2015-03-13 ENCOUNTER — Other Ambulatory Visit: Payer: Self-pay | Admitting: Internal Medicine

## 2015-03-27 DIAGNOSIS — N529 Male erectile dysfunction, unspecified: Secondary | ICD-10-CM | POA: Diagnosis not present

## 2015-04-10 ENCOUNTER — Telehealth: Payer: Self-pay | Admitting: Internal Medicine

## 2015-04-10 MED ORDER — DULOXETINE HCL 60 MG PO CPEP: 60.0000 mg | ORAL_CAPSULE | Freq: Every day | ORAL | Status: DC

## 2015-04-10 NOTE — Telephone Encounter (Signed)
Done. See meds. Pt informed  

## 2015-04-10 NOTE — Telephone Encounter (Signed)
Pt came by office requesting a refll on Duloxetine HCL DR 60 MG CAP.  Pt stated he only has 4 pills left.  Please send refill to [facility] [place]

## 2015-05-14 ENCOUNTER — Other Ambulatory Visit: Payer: Self-pay | Admitting: Internal Medicine

## 2015-05-22 ENCOUNTER — Encounter: Payer: Self-pay | Admitting: Gastroenterology

## 2015-06-06 ENCOUNTER — Other Ambulatory Visit (INDEPENDENT_AMBULATORY_CARE_PROVIDER_SITE_OTHER): Payer: Medicare Other

## 2015-06-06 ENCOUNTER — Encounter: Payer: Self-pay | Admitting: Internal Medicine

## 2015-06-06 ENCOUNTER — Ambulatory Visit (INDEPENDENT_AMBULATORY_CARE_PROVIDER_SITE_OTHER): Payer: Medicare Other | Admitting: Internal Medicine

## 2015-06-06 VITALS — BP 120/80 | HR 62 | Wt 236.0 lb

## 2015-06-06 DIAGNOSIS — M5440 Lumbago with sciatica, unspecified side: Secondary | ICD-10-CM | POA: Diagnosis not present

## 2015-06-06 DIAGNOSIS — D509 Iron deficiency anemia, unspecified: Secondary | ICD-10-CM | POA: Diagnosis not present

## 2015-06-06 DIAGNOSIS — E538 Deficiency of other specified B group vitamins: Secondary | ICD-10-CM

## 2015-06-06 DIAGNOSIS — E785 Hyperlipidemia, unspecified: Secondary | ICD-10-CM

## 2015-06-06 DIAGNOSIS — M164 Bilateral post-traumatic osteoarthritis of hip: Secondary | ICD-10-CM

## 2015-06-06 LAB — CBC WITH DIFFERENTIAL/PLATELET
Basophils Absolute: 0.1 10*3/uL (ref 0.0–0.1)
Basophils Relative: 1.1 % (ref 0.0–3.0)
Eosinophils Absolute: 0.2 10*3/uL (ref 0.0–0.7)
Eosinophils Relative: 3.8 % (ref 0.0–5.0)
HCT: 34.3 % — ABNORMAL LOW (ref 39.0–52.0)
Hemoglobin: 10.6 g/dL — ABNORMAL LOW (ref 13.0–17.0)
Lymphocytes Relative: 35 % (ref 12.0–46.0)
Lymphs Abs: 1.6 10*3/uL (ref 0.7–4.0)
MCHC: 30.9 g/dL (ref 30.0–36.0)
MCV: 76.4 fl — ABNORMAL LOW (ref 78.0–100.0)
Monocytes Absolute: 0.8 10*3/uL (ref 0.1–1.0)
Monocytes Relative: 18.3 % — ABNORMAL HIGH (ref 3.0–12.0)
Neutro Abs: 1.9 10*3/uL (ref 1.4–7.7)
Neutrophils Relative %: 41.8 % — ABNORMAL LOW (ref 43.0–77.0)
Platelets: 255 10*3/uL (ref 150.0–400.0)
RBC: 4.48 Mil/uL (ref 4.22–5.81)
RDW: 17.4 % — ABNORMAL HIGH (ref 11.5–15.5)
WBC: 4.6 10*3/uL (ref 4.0–10.5)

## 2015-06-06 LAB — BASIC METABOLIC PANEL
BUN: 16 mg/dL (ref 6–23)
CO2: 31 mEq/L (ref 19–32)
Calcium: 9.5 mg/dL (ref 8.4–10.5)
Chloride: 105 mEq/L (ref 96–112)
Creatinine, Ser: 0.8 mg/dL (ref 0.40–1.50)
GFR: 100.37 mL/min (ref >60.00)
Glucose, Bld: 104 mg/dL — ABNORMAL HIGH (ref 70–99)
Potassium: 4.8 mEq/L (ref 3.5–5.1)
Sodium: 142 mEq/L (ref 135–145)

## 2015-06-06 LAB — IBC PANEL
Iron: 20 ug/dL — ABNORMAL LOW (ref 42–165)
Saturation Ratios: 4.2 % — ABNORMAL LOW (ref 20.0–50.0)
Transferrin: 338 mg/dL (ref 212.0–360.0)

## 2015-06-06 MED ORDER — FUROSEMIDE 20 MG PO TABS: 20.0000 mg | ORAL_TABLET | Freq: Every day | ORAL | Status: DC | PRN

## 2015-06-06 MED ORDER — TAMSULOSIN HCL 0.4 MG PO CAPS: 0.4000 mg | ORAL_CAPSULE | Freq: Every day | ORAL | Status: DC

## 2015-06-06 MED ORDER — SIMVASTATIN 40 MG PO TABS: 40.0000 mg | ORAL_TABLET | Freq: Every day | ORAL | Status: DC

## 2015-06-06 MED ORDER — POLYSACCHARIDE IRON COMPLEX 150 MG PO CAPS: 150.0000 mg | ORAL_CAPSULE | Freq: Every day | ORAL | Status: DC

## 2015-06-06 NOTE — Progress Notes (Signed)
Pre visit review using our clinic review tool, if applicable. No additional management support is needed unless otherwise documented below in the visit note. 

## 2015-06-06 NOTE — Assessment & Plan Note (Signed)
2016 - ?spinal stenosis sx's

## 2015-06-06 NOTE — Assessment & Plan Note (Signed)
On B12 

## 2015-06-06 NOTE — Assessment & Plan Note (Signed)
On Simvastatin 

## 2015-06-06 NOTE — Progress Notes (Signed)
Subjective:  Patient ID: [name], male    DOB: 11-19-1940  Age: 74 y.o. MRN: [id number]  CC: No chief complaint on file.   HPI [name] presents for CAD, OA, LBP, depression f/u  Outpatient Prescriptions Prior to Visit  Medication Sig Dispense Refill  . aspirin 81 MG chewable tablet Chew 81 mg by mouth at bedtime.    . Cyanocobalamin (VITAMIN B-12) 1000 MCG SUBL Place 1 tablet (1,000 mcg total) under the tongue daily. 100 tablet 3  . DULoxetine (CYMBALTA) 60 MG capsule Take 1 capsule (60 mg total) by mouth daily. 90 capsule 3  . finasteride (PROSCAR) 5 MG tablet TAKE 1 TABLET (5 MG TOTAL) BY MOUTH DAILY. 90 tablet 3  . folic acid (V-R FOLIC ACID) 865 MCG tablet Take 1 tablet (400 mcg total) by mouth daily. 90 tablet 3  . furosemide (LASIX) 20 MG tablet Take 1-2 tablets (20-40 mg total) by mouth daily as needed for edema. 60 tablet 5  . iron polysaccharides (NIFEREX) 150 MG capsule Take 1 capsule (150 mg total) by mouth 3 (three) times daily. 90 capsule 5  . naproxen sodium (ANAPROX) 220 MG tablet Take 220 mg by mouth daily as needed (pain).    . simvastatin (ZOCOR) 40 MG tablet Take 40 mg by mouth at bedtime.    . tadalafil (CIALIS) 20 MG tablet Take 1 tablet (20 mg total) by mouth daily as needed for erectile dysfunction. 10 tablet 1  . tamsulosin (FLOMAX) 0.4 MG CAPS capsule TAKE 1 CAPSULE (0.4 MG TOTAL) BY MOUTH DAILY. 90 capsule 0   No facility-administered medications prior to visit.    ROS Review of Systems  Constitutional: Negative for appetite change, fatigue and unexpected weight change.  HENT: Negative for congestion, nosebleeds, sneezing, sore throat and trouble swallowing.   Eyes: Negative for itching and visual disturbance.  Respiratory: Negative for cough.   Cardiovascular: Negative for chest pain, palpitations and leg swelling.  Gastrointestinal: Negative for nausea, diarrhea, blood in stool and abdominal distention.  Genitourinary: Negative for  frequency and hematuria.  Musculoskeletal: Positive for back pain, arthralgias and gait problem. Negative for joint swelling and neck pain.  Skin: Negative for rash.  Neurological: Negative for dizziness, tremors, speech difficulty and weakness.  Psychiatric/Behavioral: Negative for sleep disturbance, dysphoric mood and agitation. The patient is not nervous/anxious.     Objective:  BP 120/80 mmHg  Pulse 62  Wt 236 lb (107.049 kg)  SpO2 97%  BP Readings from Last 3 Encounters:  06/06/15 120/80  01/30/15 110/70  11/28/14 130/80    Wt Readings from Last 3 Encounters:  06/06/15 236 lb (107.049 kg)  01/30/15 236 lb (107.049 kg)  11/28/14 232 lb (105.235 kg)    Physical Exam  Constitutional: He is oriented to person, place, and time. He appears well-developed. No distress.  NAD  HENT:  Mouth/Throat: Oropharynx is clear and moist.  Eyes: Conjunctivae are normal. Pupils are equal, round, and reactive to light.  Neck: Normal range of motion. No JVD present. No thyromegaly present.  Cardiovascular: Normal rate, regular rhythm, normal heart sounds and intact distal pulses.  Exam reveals no gallop and no friction rub.   No murmur heard. Pulmonary/Chest: Effort normal and breath sounds normal. No respiratory distress. He has no wheezes. He has no rales. He exhibits no tenderness.  Abdominal: Soft. Bowel sounds are normal. He exhibits no distension and no mass. There is no tenderness. There is no rebound and no guarding.  Musculoskeletal: Normal  range of motion. He exhibits tenderness. He exhibits no edema.  Lymphadenopathy:    He has no cervical adenopathy.  Neurological: He is alert and oriented to person, place, and time. He has normal reflexes. No cranial nerve deficit. He exhibits normal muscle tone. He displays a negative Romberg sign. Coordination and gait normal.  Skin: Skin is warm and dry. No rash noted.  Psychiatric: He has a normal mood and affect. His behavior is normal.  Judgment and thought content normal.  LS tender  Lab Results  Component Value Date   WBC 4.1 01/30/2015   HGB 12.2* 01/30/2015   HCT 37.5* 01/30/2015   PLT 235.0 01/30/2015   GLUCOSE 101* 01/30/2015   CHOL 162 11/07/2013   TRIG 122.0 11/07/2013   HDL 57.30 11/07/2013   LDLDIRECT 157.5 07/05/2008   LDLCALC 80 11/07/2013   ALT 9 05/16/2014   AST 19 05/16/2014   NA 139 01/30/2015   K 4.4 01/30/2015   CL 104 01/30/2015   CREATININE 0.77 01/30/2015   BUN 13 01/30/2015   CO2 30 01/30/2015   TSH 1.290 02/21/2014   PSA 0.49 03/01/2012   INR 1.18 02/22/2014    Mr Brain Wo Contrast  12/14/2014  CLINICAL DATA:  74 year old male with episodes of left side tingling involving the face and neck. Face/mandible paresthesia. Chronic left upper extremity weakness. Initial encounter. EXAM: MRI HEAD WITHOUT CONTRAST MRI CERVICAL SPINE WITHOUT CONTRAST TECHNIQUE: Multiplanar, multiecho pulse sequences of the brain and surrounding structures, and cervical spine, to include the craniocervical junction and cervicothoracic junction, were obtained without intravenous contrast. COMPARISON:  Cervical spine MRI 02/24/2008. FINDINGS: MRI HEAD FINDINGS Cerebral volume is within normal limits for age. Major intracranial vascular flow voids are preserved, dominant appearing distal right vertebral artery. No restricted diffusion to suggest acute infarction. No midline shift, mass effect, evidence of mass lesion, ventriculomegaly, extra-axial collection or acute intracranial hemorrhage. Cervicomedullary junction and pituitary are within normal limits. Patchy and confluent cerebral white matter T2 and FLAIR hyperintensity, moderate for age. No cortical encephalomalacia identified. Deep gray matter nuclei, brainstem and cerebellum are within normal limits for age. No chronic blood products are identified in the brain. Visible internal auditory structures appear normal. Mastoids are clear. Mild to moderate paranasal sinus  mucosal thickening. Postoperative changes to both globes. Visualized scalp soft tissues are within normal limits. Normal bone marrow signal. MRI CERVICAL SPINE FINDINGS Chronic straightening of cervical lordosis. Chronic degenerative endplate marrow changes, with progression at C5-C6 and C6-C7. No marrow edema or evidence of acute osseous abnormality. Cervicomedullary junction is within normal limits. There is no definite abnormal cervical spinal cord signal. Negative paraspinal soft tissues, chronic dominant right and diminutive left vertebral arteries. C2-C3: Chronic mild multifactorial spinal stenosis has not significantly changed, and there is no cord mass effect. Mild ligament flavum hypertrophy. Moderate to severe right C3 foraminal stenosis appears increased related to chronic facet and uncovertebral hypertrophy. C3-C4: Chronic multifactorial spinal stenosis has progressed with increased spinal cord mass effect (series 1700, image 8), but no definite cord signal abnormality. Progressed bilateral facet hypertrophy, severe on the left. Chronic circumferential disc osteophyte complex. Progressed ligament flavum hypertrophy. Severe bilateral C4 foraminal stenosis is increased as well. C4-C5: Chronic multifactorial spinal stenosis has progressed with increased spinal cord mass effect (series 1700, image 8) but no definite cord signal abnormality. Chronic right eccentric disc osteophyte complex. Progressed bilateral facet and ligament flavum hypertrophy. Severe right and moderate left C5 foraminal stenosis has increased. C5-C6: Chronic anterior eccentric disc osteophyte  complex. Progressed moderate facet hypertrophy. Mild spinal stenosis without spinal cord mass effect has not significantly changed. Moderate left C6 foraminal stenosis is stable. C6-C7: Chronic anterior eccentric disc osteophyte complex. Mild spinal stenosis without spinal cord mass effect has not significantly changed. Moderate right greater than  left facet hypertrophy is stable with moderate left greater than right C7 foraminal stenosis. C7-T1: Mildly increased small chronic left paracentral disc protrusion superimposed on mild disc osteophyte complex (best seen on series 21, image 30). Chronic facet hypertrophy has progressed and is moderate to severe greater on the left. Mild spinal stenosis without spinal cord mass effect has increased. Moderate bilateral C8 foraminal stenosis has increased. Upper thoracic chronic spinal stenosis at T1-T2 appears not significantly changed. IMPRESSION: 1. No acute intracranial abnormality. Moderate for age nonspecific white matter signal changes most commonly due to chronic small vessel disease. 2. Chronic widespread multifactorial cervical spinal stenosis has progressed since 2009. Associated mass effect on the spinal cord has increased at both C3-C4 and C4-C5, but there is no definite cord edema or myelomalacia. 3. Progressed moderate or severe multifactorial neural foraminal stenosis at the right C3, bilateral C4, and bilateral C5 nerve levels. Electronically Signed   By: Genevie Ann M.D.   On: 12/14/2014 16:57   Mr Cervical Spine Wo Contrast  12/14/2014  CLINICAL DATA:  74 year old male with episodes of left side tingling involving the face and neck. Face/mandible paresthesia. Chronic left upper extremity weakness. Initial encounter. EXAM: MRI HEAD WITHOUT CONTRAST MRI CERVICAL SPINE WITHOUT CONTRAST TECHNIQUE: Multiplanar, multiecho pulse sequences of the brain and surrounding structures, and cervical spine, to include the craniocervical junction and cervicothoracic junction, were obtained without intravenous contrast. COMPARISON:  Cervical spine MRI 02/24/2008. FINDINGS: MRI HEAD FINDINGS Cerebral volume is within normal limits for age. Major intracranial vascular flow voids are preserved, dominant appearing distal right vertebral artery. No restricted diffusion to suggest acute infarction. No midline shift, mass  effect, evidence of mass lesion, ventriculomegaly, extra-axial collection or acute intracranial hemorrhage. Cervicomedullary junction and pituitary are within normal limits. Patchy and confluent cerebral white matter T2 and FLAIR hyperintensity, moderate for age. No cortical encephalomalacia identified. Deep gray matter nuclei, brainstem and cerebellum are within normal limits for age. No chronic blood products are identified in the brain. Visible internal auditory structures appear normal. Mastoids are clear. Mild to moderate paranasal sinus mucosal thickening. Postoperative changes to both globes. Visualized scalp soft tissues are within normal limits. Normal bone marrow signal. MRI CERVICAL SPINE FINDINGS Chronic straightening of cervical lordosis. Chronic degenerative endplate marrow changes, with progression at C5-C6 and C6-C7. No marrow edema or evidence of acute osseous abnormality. Cervicomedullary junction is within normal limits. There is no definite abnormal cervical spinal cord signal. Negative paraspinal soft tissues, chronic dominant right and diminutive left vertebral arteries. C2-C3: Chronic mild multifactorial spinal stenosis has not significantly changed, and there is no cord mass effect. Mild ligament flavum hypertrophy. Moderate to severe right C3 foraminal stenosis appears increased related to chronic facet and uncovertebral hypertrophy. C3-C4: Chronic multifactorial spinal stenosis has progressed with increased spinal cord mass effect (series 1700, image 8), but no definite cord signal abnormality. Progressed bilateral facet hypertrophy, severe on the left. Chronic circumferential disc osteophyte complex. Progressed ligament flavum hypertrophy. Severe bilateral C4 foraminal stenosis is increased as well. C4-C5: Chronic multifactorial spinal stenosis has progressed with increased spinal cord mass effect (series 1700, image 8) but no definite cord signal abnormality. Chronic right eccentric disc  osteophyte complex. Progressed bilateral facet and ligament  flavum hypertrophy. Severe right and moderate left C5 foraminal stenosis has increased. C5-C6: Chronic anterior eccentric disc osteophyte complex. Progressed moderate facet hypertrophy. Mild spinal stenosis without spinal cord mass effect has not significantly changed. Moderate left C6 foraminal stenosis is stable. C6-C7: Chronic anterior eccentric disc osteophyte complex. Mild spinal stenosis without spinal cord mass effect has not significantly changed. Moderate right greater than left facet hypertrophy is stable with moderate left greater than right C7 foraminal stenosis. C7-T1: Mildly increased small chronic left paracentral disc protrusion superimposed on mild disc osteophyte complex (best seen on series 21, image 30). Chronic facet hypertrophy has progressed and is moderate to severe greater on the left. Mild spinal stenosis without spinal cord mass effect has increased. Moderate bilateral C8 foraminal stenosis has increased. Upper thoracic chronic spinal stenosis at T1-T2 appears not significantly changed. IMPRESSION: 1. No acute intracranial abnormality. Moderate for age nonspecific white matter signal changes most commonly due to chronic small vessel disease. 2. Chronic widespread multifactorial cervical spinal stenosis has progressed since 2009. Associated mass effect on the spinal cord has increased at both C3-C4 and C4-C5, but there is no definite cord edema or myelomalacia. 3. Progressed moderate or severe multifactorial neural foraminal stenosis at the right C3, bilateral C4, and bilateral C5 nerve levels. Electronically Signed   By: Genevie Ann M.D.   On: 12/14/2014 16:57    Assessment & Plan:   Diagnoses and all orders for this visit:  Low back pain with sciatica, sciatica laterality unspecified, unspecified back pain laterality, unspecified chronicity  Post-traumatic osteoarthritis of both hips  Other orders -     furosemide (LASIX)  20 MG tablet; Take 1-2 tablets (20-40 mg total) by mouth daily as needed for edema. -     iron polysaccharides (NIFEREX) 150 MG capsule; Take 1 capsule (150 mg total) by mouth. -     tamsulosin (FLOMAX) 0.4 MG CAPS capsule; Take 1 capsule (0.4 mg total) by mouth daily. -     simvastatin (ZOCOR) 40 MG tablet; Take 1 tablet (40 mg total) by mouth at bedtime.   I am having Mr. Guimont maintain his simvastatin, furosemide, Vitamin B-12, aspirin, naproxen sodium, iron polysaccharides, folic acid, tadalafil, tamsulosin, DULoxetine, and finasteride.  No orders of the defined types were placed in this encounter.     Follow-up: No Follow-up on file.  [name], MD

## 2015-06-06 NOTE — Assessment & Plan Note (Signed)
ASA, Simvastatin 

## 2015-06-06 NOTE — Assessment & Plan Note (Signed)
Labs On iron

## 2015-06-06 NOTE — Assessment & Plan Note (Signed)
Chronic. 

## 2015-07-13 DIAGNOSIS — N5201 Erectile dysfunction due to arterial insufficiency: Secondary | ICD-10-CM | POA: Diagnosis not present

## 2015-07-17 ENCOUNTER — Telehealth: Payer: Self-pay | Admitting: Internal Medicine

## 2015-07-17 ENCOUNTER — Other Ambulatory Visit (INDEPENDENT_AMBULATORY_CARE_PROVIDER_SITE_OTHER): Payer: Medicare Other

## 2015-07-17 DIAGNOSIS — I10 Essential (primary) hypertension: Secondary | ICD-10-CM | POA: Diagnosis not present

## 2015-07-17 DIAGNOSIS — D509 Iron deficiency anemia, unspecified: Secondary | ICD-10-CM

## 2015-07-17 LAB — CBC WITH DIFFERENTIAL/PLATELET
Basophils Absolute: 0 10*3/uL (ref 0.0–0.1)
Basophils Relative: 0.6 % (ref 0.0–3.0)
Eosinophils Absolute: 0.1 10*3/uL (ref 0.0–0.7)
Eosinophils Relative: 2.3 % (ref 0.0–5.0)
HCT: 37.2 % — ABNORMAL LOW (ref 39.0–52.0)
Hemoglobin: 11.7 g/dL — ABNORMAL LOW (ref 13.0–17.0)
Lymphocytes Relative: 27.7 % (ref 12.0–46.0)
Lymphs Abs: 1.6 10*3/uL (ref 0.7–4.0)
MCHC: 31.5 g/dL (ref 30.0–36.0)
MCV: 79.6 fl (ref 78.0–100.0)
Monocytes Absolute: 1 10*3/uL (ref 0.1–1.0)
Monocytes Relative: 17.1 % — ABNORMAL HIGH (ref 3.0–12.0)
Neutro Abs: 2.9 10*3/uL (ref 1.4–7.7)
Neutrophils Relative %: 52.3 % (ref 43.0–77.0)
Platelets: 209 10*3/uL (ref 150.0–400.0)
RBC: 4.67 Mil/uL (ref 4.22–5.81)
RDW: 20.8 % — ABNORMAL HIGH (ref 11.5–15.5)
WBC: 5.6 10*3/uL (ref 4.0–10.5)

## 2015-07-17 LAB — BASIC METABOLIC PANEL
BUN: 16 mg/dL (ref 6–23)
CO2: 30 mEq/L (ref 19–32)
Calcium: 9.8 mg/dL (ref 8.4–10.5)
Chloride: 104 mEq/L (ref 96–112)
Creatinine, Ser: 0.82 mg/dL (ref 0.40–1.50)
GFR: 97.52 mL/min (ref >60.00)
Glucose, Bld: 99 mg/dL (ref 70–99)
Potassium: 4.7 mEq/L (ref 3.5–5.1)
Sodium: 140 mEq/L (ref 135–145)

## 2015-07-17 LAB — IBC PANEL
Iron: 51 ug/dL (ref 42–165)
Saturation Ratios: 11.6 % — ABNORMAL LOW (ref 20.0–50.0)
Transferrin: 314 mg/dL (ref 212.0–360.0)

## 2015-07-17 NOTE — Telephone Encounter (Signed)
Pt wife called in and is concerned about his blood work and would like if someone could call her by 5 today with the results

## 2015-07-18 ENCOUNTER — Other Ambulatory Visit (INDEPENDENT_AMBULATORY_CARE_PROVIDER_SITE_OTHER): Payer: Medicare Other

## 2015-07-18 ENCOUNTER — Ambulatory Visit (INDEPENDENT_AMBULATORY_CARE_PROVIDER_SITE_OTHER): Payer: Medicare Other | Admitting: Internal Medicine

## 2015-07-18 ENCOUNTER — Encounter: Payer: Self-pay | Admitting: Internal Medicine

## 2015-07-18 ENCOUNTER — Telehealth: Payer: Self-pay

## 2015-07-18 VITALS — BP 138/80 | HR 70 | Temp 98.3°F | Resp 20 | Wt 237.0 lb

## 2015-07-18 DIAGNOSIS — R0600 Dyspnea, unspecified: Secondary | ICD-10-CM

## 2015-07-18 DIAGNOSIS — R29898 Other symptoms and signs involving the musculoskeletal system: Secondary | ICD-10-CM

## 2015-07-18 DIAGNOSIS — R0609 Other forms of dyspnea: Principal | ICD-10-CM

## 2015-07-18 DIAGNOSIS — E785 Hyperlipidemia, unspecified: Secondary | ICD-10-CM | POA: Diagnosis not present

## 2015-07-18 DIAGNOSIS — D509 Iron deficiency anemia, unspecified: Secondary | ICD-10-CM | POA: Diagnosis not present

## 2015-07-18 LAB — SEDIMENTATION RATE: Sed Rate: 12 mm/hr (ref 0–22)

## 2015-07-18 LAB — CK: Total CK: 76 U/L (ref 7–232)

## 2015-07-18 LAB — TSH: TSH: 0.49 u[IU]/mL (ref 0.35–4.50)

## 2015-07-18 NOTE — Patient Instructions (Signed)
  Your next office appointment will be determined based upon review of your pending labs .  Those written interpretation of the lab results and instructions will be transmitted to you by My Chart   Critical results will be called.   Followup as needed for any active or acute issue. Please report any significant change in your symptoms.  Hold Simvastatin until results back.

## 2015-07-18 NOTE — Progress Notes (Signed)
   Subjective:    Patient ID: [name], male    DOB: October 28, 1940, 74 y.o.   MRN: [id number]  HPI He describes feeling weak and faint. This actually is a chronic problem. In June of last year he was hospitalized with a hemoglobin of 6 and required 3 transfusions. Extensive evaluation at that time included upper endoscopy, colonoscopy, and camera endoscopy without definite active bleeding being documented. Following discharge he received iron infusions by Dr.Gorsuch. The main weaknesses is in his legs when he walks. This precludes him from exercising.  He has lost 10-15 pounds over the  last 18 months. He has some minor epigastric discomfort. Stool is black but he is on 150 mg of iron daily.  In addition to weakness in his legs, he describes occasional numbness and tingling in the feet. There are no other neuromuscular symptoms.  His 26 year old son apparently may have had a stroke and he has to travel to [place] this holiday weekend.    Review of Systems Epistaxis, hemoptysis, hematuria, or rectal bleeding denied. No significant dyspepsia,dysphagia, or abdominal pain.  There is no abnormal bruising , bleeding, or difficulty stopping bleeding with injury.  Chest pain, palpitations, tachycardia, exertional dyspnea, paroxysmal nocturnal dyspnea, frank claudication or edema are absent.  Fever, chills, sweats not present. Vertigo, near syncope or imbalance denied. No loss of control of bladder or bowels. Radicular type pain absent.     Objective:   Physical Exam Pertinent or positive findings include:he has bilateral hearing aids. Pattern alopecia is present. There is an upper partial. Heart sounds are distant. Ventral hernia is present. He has DIP arthritic changes in the hands. Fusiform changes in the knees are present with crepitus.   General appearance :adequately nourished; in no distress.  Eyes: No conjunctival inflammation or scleral icterus is present.  Oral exam:   Lips and gums are healthy appearing.There is no oropharyngeal erythema or exudate noted.   Heart:  Normal rate and regular rhythm. S1 and S2 normal without gallop, murmur, click, rub or other extra sounds    Lungs:Chest clear to auscultation; no wheezes, rhonchi,rales ,or rubs present.No increased work of breathing.   Abdomen: bowel sounds normal, soft and non-tender without masses, or organomegaly noted.  No guarding or rebound.   Vascular : all pulses equal ; no bruits present.  Skin:Warm & dry.  Intact without suspicious lesions or rashes ; no tenting or jaundice   Lymphatic: No lymphadenopathy is noted about the head, neck, axilla.   Neuro: Strength, tone & DTRs normal.gait is slightly slow but heel and toe walking were completed without difficulty.      Assessment & Plan:  #1 leg weakness #2 iron def anemia See orders

## 2015-07-18 NOTE — Telephone Encounter (Signed)
Called patient and left message for them to call back. Wife was concerned about patients blood work and wanted to know the results, the tests have been completed but the doctor has not resulted them yet therefore we can not give her the final result until he does so.

## 2015-07-23 ENCOUNTER — Ambulatory Visit (INDEPENDENT_AMBULATORY_CARE_PROVIDER_SITE_OTHER): Payer: Medicare Other | Admitting: Internal Medicine

## 2015-07-23 ENCOUNTER — Encounter: Payer: Self-pay | Admitting: Internal Medicine

## 2015-07-23 VITALS — BP 100/60 | HR 64 | Wt 238.0 lb

## 2015-07-23 DIAGNOSIS — I25119 Atherosclerotic heart disease of native coronary artery with unspecified angina pectoris: Secondary | ICD-10-CM | POA: Diagnosis not present

## 2015-07-23 DIAGNOSIS — R531 Weakness: Secondary | ICD-10-CM | POA: Diagnosis not present

## 2015-07-23 DIAGNOSIS — I10 Essential (primary) hypertension: Secondary | ICD-10-CM

## 2015-07-23 DIAGNOSIS — R0600 Dyspnea, unspecified: Secondary | ICD-10-CM | POA: Diagnosis not present

## 2015-07-23 NOTE — Progress Notes (Signed)
Subjective:  Patient ID: [name], male    DOB: 1940-09-21  Age: 74 y.o. MRN: [id number]  CC: No chief complaint on file.   HPI Aizik Brolin Daher presents for dizziness, fatigue x 1 month. C/o no appetite x 1 mo. C/o weakness w/exertion.   Outpatient Prescriptions Prior to Visit  Medication Sig Dispense Refill  . aspirin 81 MG chewable tablet Chew 81 mg by mouth at bedtime.    . Cyanocobalamin (VITAMIN B-12) 1000 MCG SUBL Place 1 tablet (1,000 mcg total) under the tongue daily. 100 tablet 3  . DULoxetine (CYMBALTA) 60 MG capsule Take 1 capsule (60 mg total) by mouth daily. 90 capsule 3  . finasteride (PROSCAR) 5 MG tablet TAKE 1 TABLET (5 MG TOTAL) BY MOUTH DAILY. 90 tablet 3  . folic acid (V-R FOLIC ACID) A999333 MCG tablet Take 1 tablet (400 mcg total) by mouth daily. 90 tablet 3  . iron polysaccharides (NIFEREX) 150 MG capsule Take 1 capsule (150 mg total) by mouth daily. 90 capsule 3  . naproxen sodium (ANAPROX) 220 MG tablet Take 220 mg by mouth daily as needed (pain).    . simvastatin (ZOCOR) 40 MG tablet Take 1 tablet (40 mg total) by mouth at bedtime. 90 tablet 3  . tamsulosin (FLOMAX) 0.4 MG CAPS capsule Take 1 capsule (0.4 mg total) by mouth daily. 90 capsule 3  . furosemide (LASIX) 20 MG tablet Take 1-2 tablets (20-40 mg total) by mouth daily as needed for edema. (Patient not taking: Reported on 07/23/2015) 180 tablet 3  . tadalafil (CIALIS) 20 MG tablet Take 1 tablet (20 mg total) by mouth daily as needed for erectile dysfunction. (Patient not taking: Reported on 07/23/2015) 10 tablet 1   No facility-administered medications prior to visit.    ROS Review of Systems  Constitutional: Positive for fatigue. Negative for appetite change and unexpected weight change.  HENT: Negative for congestion, nosebleeds, sneezing, sore throat and trouble swallowing.   Eyes: Negative for itching and visual disturbance.  Respiratory: Positive for shortness of breath. Negative for  cough, choking, chest tightness, wheezing and stridor.   Cardiovascular: Negative for chest pain, palpitations and leg swelling.  Gastrointestinal: Positive for nausea. Negative for abdominal pain, diarrhea, blood in stool and abdominal distention.  Genitourinary: Negative for frequency and hematuria.  Musculoskeletal: Negative for back pain, joint swelling, gait problem and neck pain.  Skin: Negative for rash.  Neurological: Positive for weakness and light-headedness. Negative for dizziness, tremors and speech difficulty.  Psychiatric/Behavioral: Negative for sleep disturbance, dysphoric mood and agitation. The patient is not nervous/anxious.     Objective:  BP 100/60 mmHg  Pulse 64  Wt 238 lb (107.956 kg)  SpO2 97%  BP Readings from Last 3 Encounters:  07/23/15 100/60  07/18/15 138/80  06/06/15 120/80    Wt Readings from Last 3 Encounters:  07/23/15 238 lb (107.956 kg)  07/18/15 237 lb (107.502 kg)  06/06/15 236 lb (107.049 kg)    Physical Exam  Constitutional: He is oriented to person, place, and time. He appears well-developed. No distress.  NAD  HENT:  Mouth/Throat: Oropharynx is clear and moist.  Eyes: Conjunctivae are normal. Pupils are equal, round, and reactive to light.  Neck: Normal range of motion. No JVD present. No thyromegaly present.  Cardiovascular: Normal rate, regular rhythm, normal heart sounds and intact distal pulses.  Exam reveals no gallop and no friction rub.   No murmur heard. Pulmonary/Chest: Effort normal and breath sounds normal. No respiratory distress.  He has no wheezes. He has no rales. He exhibits no tenderness.  Abdominal: Soft. Bowel sounds are normal. He exhibits no distension and no mass. There is no tenderness. There is no rebound and no guarding.  Musculoskeletal: Normal range of motion. He exhibits no edema or tenderness.  Lymphadenopathy:    He has no cervical adenopathy.  Neurological: He is alert and oriented to person, place, and  time. He has normal reflexes. No cranial nerve deficit. He exhibits normal muscle tone. He displays a negative Romberg sign. Coordination and gait normal.  Skin: Skin is warm and dry. No rash noted.  Psychiatric: He has a normal mood and affect. His behavior is normal. Judgment and thought content normal.    Lab Results  Component Value Date   WBC 5.6 07/17/2015   HGB 11.7* 07/17/2015   HCT 37.2* 07/17/2015   PLT 209.0 07/17/2015   GLUCOSE 99 07/17/2015   CHOL 162 11/07/2013   TRIG 122.0 11/07/2013   HDL 57.30 11/07/2013   LDLDIRECT 157.5 07/05/2008   LDLCALC 80 11/07/2013   ALT 9 05/16/2014   AST 19 05/16/2014   NA 140 07/17/2015   K 4.7 07/17/2015   CL 104 07/17/2015   CREATININE 0.82 07/17/2015   BUN 16 07/17/2015   CO2 30 07/17/2015   TSH 0.49 07/18/2015   PSA 0.49 03/01/2012   INR 1.18 02/22/2014    Mr Brain Wo Contrast  12/14/2014  CLINICAL DATA:  74 year old male with episodes of left side tingling involving the face and neck. Face/mandible paresthesia. Chronic left upper extremity weakness. Initial encounter. EXAM: MRI HEAD WITHOUT CONTRAST MRI CERVICAL SPINE WITHOUT CONTRAST TECHNIQUE: Multiplanar, multiecho pulse sequences of the brain and surrounding structures, and cervical spine, to include the craniocervical junction and cervicothoracic junction, were obtained without intravenous contrast. COMPARISON:  Cervical spine MRI 02/24/2008. FINDINGS: MRI HEAD FINDINGS Cerebral volume is within normal limits for age. Major intracranial vascular flow voids are preserved, dominant appearing distal right vertebral artery. No restricted diffusion to suggest acute infarction. No midline shift, mass effect, evidence of mass lesion, ventriculomegaly, extra-axial collection or acute intracranial hemorrhage. Cervicomedullary junction and pituitary are within normal limits. Patchy and confluent cerebral white matter T2 and FLAIR hyperintensity, moderate for age. No cortical encephalomalacia  identified. Deep gray matter nuclei, brainstem and cerebellum are within normal limits for age. No chronic blood products are identified in the brain. Visible internal auditory structures appear normal. Mastoids are clear. Mild to moderate paranasal sinus mucosal thickening. Postoperative changes to both globes. Visualized scalp soft tissues are within normal limits. Normal bone marrow signal. MRI CERVICAL SPINE FINDINGS Chronic straightening of cervical lordosis. Chronic degenerative endplate marrow changes, with progression at C5-C6 and C6-C7. No marrow edema or evidence of acute osseous abnormality. Cervicomedullary junction is within normal limits. There is no definite abnormal cervical spinal cord signal. Negative paraspinal soft tissues, chronic dominant right and diminutive left vertebral arteries. C2-C3: Chronic mild multifactorial spinal stenosis has not significantly changed, and there is no cord mass effect. Mild ligament flavum hypertrophy. Moderate to severe right C3 foraminal stenosis appears increased related to chronic facet and uncovertebral hypertrophy. C3-C4: Chronic multifactorial spinal stenosis has progressed with increased spinal cord mass effect (series 1700, image 8), but no definite cord signal abnormality. Progressed bilateral facet hypertrophy, severe on the left. Chronic circumferential disc osteophyte complex. Progressed ligament flavum hypertrophy. Severe bilateral C4 foraminal stenosis is increased as well. C4-C5: Chronic multifactorial spinal stenosis has progressed with increased spinal cord mass effect (series  1700, image 8) but no definite cord signal abnormality. Chronic right eccentric disc osteophyte complex. Progressed bilateral facet and ligament flavum hypertrophy. Severe right and moderate left C5 foraminal stenosis has increased. C5-C6: Chronic anterior eccentric disc osteophyte complex. Progressed moderate facet hypertrophy. Mild spinal stenosis without spinal cord mass  effect has not significantly changed. Moderate left C6 foraminal stenosis is stable. C6-C7: Chronic anterior eccentric disc osteophyte complex. Mild spinal stenosis without spinal cord mass effect has not significantly changed. Moderate right greater than left facet hypertrophy is stable with moderate left greater than right C7 foraminal stenosis. C7-T1: Mildly increased small chronic left paracentral disc protrusion superimposed on mild disc osteophyte complex (best seen on series 21, image 30). Chronic facet hypertrophy has progressed and is moderate to severe greater on the left. Mild spinal stenosis without spinal cord mass effect has increased. Moderate bilateral C8 foraminal stenosis has increased. Upper thoracic chronic spinal stenosis at T1-T2 appears not significantly changed. IMPRESSION: 1. No acute intracranial abnormality. Moderate for age nonspecific white matter signal changes most commonly due to chronic small vessel disease. 2. Chronic widespread multifactorial cervical spinal stenosis has progressed since 2009. Associated mass effect on the spinal cord has increased at both C3-C4 and C4-C5, but there is no definite cord edema or myelomalacia. 3. Progressed moderate or severe multifactorial neural foraminal stenosis at the right C3, bilateral C4, and bilateral C5 nerve levels. Electronically Signed   By: Genevie Ann M.D.   On: 12/14/2014 16:57   Mr Cervical Spine Wo Contrast  12/14/2014  CLINICAL DATA:  74 year old male with episodes of left side tingling involving the face and neck. Face/mandible paresthesia. Chronic left upper extremity weakness. Initial encounter. EXAM: MRI HEAD WITHOUT CONTRAST MRI CERVICAL SPINE WITHOUT CONTRAST TECHNIQUE: Multiplanar, multiecho pulse sequences of the brain and surrounding structures, and cervical spine, to include the craniocervical junction and cervicothoracic junction, were obtained without intravenous contrast. COMPARISON:  Cervical spine MRI 02/24/2008.  FINDINGS: MRI HEAD FINDINGS Cerebral volume is within normal limits for age. Major intracranial vascular flow voids are preserved, dominant appearing distal right vertebral artery. No restricted diffusion to suggest acute infarction. No midline shift, mass effect, evidence of mass lesion, ventriculomegaly, extra-axial collection or acute intracranial hemorrhage. Cervicomedullary junction and pituitary are within normal limits. Patchy and confluent cerebral white matter T2 and FLAIR hyperintensity, moderate for age. No cortical encephalomalacia identified. Deep gray matter nuclei, brainstem and cerebellum are within normal limits for age. No chronic blood products are identified in the brain. Visible internal auditory structures appear normal. Mastoids are clear. Mild to moderate paranasal sinus mucosal thickening. Postoperative changes to both globes. Visualized scalp soft tissues are within normal limits. Normal bone marrow signal. MRI CERVICAL SPINE FINDINGS Chronic straightening of cervical lordosis. Chronic degenerative endplate marrow changes, with progression at C5-C6 and C6-C7. No marrow edema or evidence of acute osseous abnormality. Cervicomedullary junction is within normal limits. There is no definite abnormal cervical spinal cord signal. Negative paraspinal soft tissues, chronic dominant right and diminutive left vertebral arteries. C2-C3: Chronic mild multifactorial spinal stenosis has not significantly changed, and there is no cord mass effect. Mild ligament flavum hypertrophy. Moderate to severe right C3 foraminal stenosis appears increased related to chronic facet and uncovertebral hypertrophy. C3-C4: Chronic multifactorial spinal stenosis has progressed with increased spinal cord mass effect (series 1700, image 8), but no definite cord signal abnormality. Progressed bilateral facet hypertrophy, severe on the left. Chronic circumferential disc osteophyte complex. Progressed ligament flavum  hypertrophy. Severe bilateral C4 foraminal stenosis  is increased as well. C4-C5: Chronic multifactorial spinal stenosis has progressed with increased spinal cord mass effect (series 1700, image 8) but no definite cord signal abnormality. Chronic right eccentric disc osteophyte complex. Progressed bilateral facet and ligament flavum hypertrophy. Severe right and moderate left C5 foraminal stenosis has increased. C5-C6: Chronic anterior eccentric disc osteophyte complex. Progressed moderate facet hypertrophy. Mild spinal stenosis without spinal cord mass effect has not significantly changed. Moderate left C6 foraminal stenosis is stable. C6-C7: Chronic anterior eccentric disc osteophyte complex. Mild spinal stenosis without spinal cord mass effect has not significantly changed. Moderate right greater than left facet hypertrophy is stable with moderate left greater than right C7 foraminal stenosis. C7-T1: Mildly increased small chronic left paracentral disc protrusion superimposed on mild disc osteophyte complex (best seen on series 21, image 30). Chronic facet hypertrophy has progressed and is moderate to severe greater on the left. Mild spinal stenosis without spinal cord mass effect has increased. Moderate bilateral C8 foraminal stenosis has increased. Upper thoracic chronic spinal stenosis at T1-T2 appears not significantly changed. IMPRESSION: 1. No acute intracranial abnormality. Moderate for age nonspecific white matter signal changes most commonly due to chronic small vessel disease. 2. Chronic widespread multifactorial cervical spinal stenosis has progressed since 2009. Associated mass effect on the spinal cord has increased at both C3-C4 and C4-C5, but there is no definite cord edema or myelomalacia. 3. Progressed moderate or severe multifactorial neural foraminal stenosis at the right C3, bilateral C4, and bilateral C5 nerve levels. Electronically Signed   By: Genevie Ann M.D.   On: 12/14/2014 16:57   EKG - S  brady  Assessment & Plan:   There are no diagnoses linked to this encounter. I am having Mr. Alas maintain his Vitamin B-12, aspirin, naproxen sodium, folic acid, tadalafil, DULoxetine, finasteride, furosemide, iron polysaccharides, tamsulosin, and simvastatin.  No orders of the defined types were placed in this encounter.     Follow-up: No Follow-up on file.  [name], MD

## 2015-07-23 NOTE — Assessment & Plan Note (Addendum)
Dr Stanford Breed Chronic. No angina (CP). New exertional weakness, SOB. ASA, Simvastatin EKG To ER  If worse CL stress test this week

## 2015-07-23 NOTE — Assessment & Plan Note (Signed)
BP is down.

## 2015-07-23 NOTE — Assessment & Plan Note (Addendum)
CBC was ok EKG To ER  If worse CL stress test this week Hold Cymbalta x 1 week abd [place] if GI sx's persist RTC 2 wks

## 2015-07-23 NOTE — Progress Notes (Signed)
Pre visit review using our clinic review tool, if applicable. No additional management support is needed unless otherwise documented below in the visit note. 

## 2015-07-23 NOTE — Assessment & Plan Note (Signed)
EKG To ER  If worse CL stress test this week

## 2015-08-02 ENCOUNTER — Telehealth (HOSPITAL_COMMUNITY): Payer: Self-pay | Admitting: *Deleted

## 2015-08-02 NOTE — Telephone Encounter (Signed)
Patient given detailed instructions per Myocardial Perfusion Study Information Sheet for the test on 08/07/15 at 1000. Patient notified to arrive 15 minutes early and that it is imperative to arrive on time for appointment to keep from having the test rescheduled.  If you need to cancel or reschedule your appointment, please call the office within 24 hours of your appointment. Failure to do so may result in a cancellation of your appointment, and a $50 no show fee. Patient verbalized understanding.Hubbard Robinson, RN

## 2015-08-07 ENCOUNTER — Ambulatory Visit (HOSPITAL_COMMUNITY): Payer: Medicare Other | Attending: Cardiology

## 2015-08-07 DIAGNOSIS — R079 Chest pain, unspecified: Secondary | ICD-10-CM | POA: Diagnosis not present

## 2015-08-07 DIAGNOSIS — I25119 Atherosclerotic heart disease of native coronary artery with unspecified angina pectoris: Secondary | ICD-10-CM

## 2015-08-07 DIAGNOSIS — R9439 Abnormal result of other cardiovascular function study: Secondary | ICD-10-CM | POA: Diagnosis not present

## 2015-08-07 DIAGNOSIS — R42 Dizziness and giddiness: Secondary | ICD-10-CM | POA: Diagnosis not present

## 2015-08-07 DIAGNOSIS — R0609 Other forms of dyspnea: Secondary | ICD-10-CM | POA: Insufficient documentation

## 2015-08-07 DIAGNOSIS — R5383 Other fatigue: Secondary | ICD-10-CM | POA: Insufficient documentation

## 2015-08-07 DIAGNOSIS — R531 Weakness: Secondary | ICD-10-CM | POA: Diagnosis not present

## 2015-08-07 DIAGNOSIS — R0600 Dyspnea, unspecified: Secondary | ICD-10-CM | POA: Insufficient documentation

## 2015-08-07 LAB — MYOCARDIAL PERFUSION IMAGING
LV dias vol: 129 mL
LV sys vol: 61 mL
Peak HR: 66 {beats}/min
RATE: 0.3
Rest HR: 58 {beats}/min
SDS: 4
SRS: 0
SSS: 4
TID: 0.97

## 2015-08-07 MED ORDER — TECHNETIUM TC 99M SESTAMIBI GENERIC - CARDIOLITE
32.6000 | Freq: Once | INTRAVENOUS | Status: AC | PRN
  Administered 2015-08-07: 33 via INTRAVENOUS

## 2015-08-07 MED ORDER — REGADENOSON 0.4 MG/5ML IV SOLN
0.4000 mg | Freq: Once | INTRAVENOUS | Status: AC
  Administered 2015-08-07: 0.4 mg via INTRAVENOUS

## 2015-08-07 MED ORDER — TECHNETIUM TC 99M SESTAMIBI GENERIC - CARDIOLITE
10.8000 | Freq: Once | INTRAVENOUS | Status: AC | PRN
  Administered 2015-08-07: 11 via INTRAVENOUS

## 2015-08-10 MED ORDER — NITROGLYCERIN 0.4 MG SL SUBL: 0.4000 mg | SUBLINGUAL_TABLET | SUBLINGUAL | Status: DC | PRN

## 2015-08-10 NOTE — Telephone Encounter (Signed)
Spoke w/pt and wife - abn CL results were discussed NTG prn Card ref To ER if CP

## 2015-08-13 ENCOUNTER — Encounter: Payer: Self-pay | Admitting: Cardiology

## 2015-08-13 ENCOUNTER — Encounter: Payer: Self-pay | Admitting: Cardiovascular Disease

## 2015-08-13 ENCOUNTER — Ambulatory Visit (INDEPENDENT_AMBULATORY_CARE_PROVIDER_SITE_OTHER): Payer: Medicare Other | Admitting: Cardiology

## 2015-08-13 VITALS — BP 96/54 | HR 56 | Ht 72.0 in | Wt 239.4 lb

## 2015-08-13 DIAGNOSIS — I25119 Atherosclerotic heart disease of native coronary artery with unspecified angina pectoris: Secondary | ICD-10-CM | POA: Diagnosis not present

## 2015-08-13 DIAGNOSIS — R072 Precordial pain: Secondary | ICD-10-CM

## 2015-08-13 DIAGNOSIS — R9439 Abnormal result of other cardiovascular function study: Secondary | ICD-10-CM | POA: Diagnosis not present

## 2015-08-13 DIAGNOSIS — D689 Coagulation defect, unspecified: Secondary | ICD-10-CM | POA: Diagnosis not present

## 2015-08-13 DIAGNOSIS — R5383 Other fatigue: Secondary | ICD-10-CM | POA: Diagnosis not present

## 2015-08-13 DIAGNOSIS — Z01818 Encounter for other preprocedural examination: Secondary | ICD-10-CM | POA: Diagnosis not present

## 2015-08-13 NOTE — Progress Notes (Signed)
Cardiology Office Note   Date:  08/14/2015   ID:  [name], DOB 11-25-1940, MRN [id number]  PCP:  [name], MD  Cardiologist:   Minus Breeding, MD   Chief Complaint  Patient presents with  . Chest Pain  . Abnormal stress test      History of Present Illness: [name] is a 74 y.o. male who presents for evaluation of an abnormal stress test, weakness. The patient has no prior cardiac history. I do note that he had a catheterization many years ago and apparently this was okay. However, he does have risk factors. He's been having some chest discomfort. This occurs sporadically. Slightly more frequent recently. Some mild gallbladder discomfort left of his sternum. It might last for 3-4 minutes. It does not radiate to his jaw or to his arms. He cannot bring it on with activities although he hasn't been particularly active because he's had decreased exercise tolerance. He denies any associated shortness of breath, PND or orthopnea.  The patient has also had worsening leg weakness and fatigue. He's had decreased exercise tolerance. He just feels tired most of the time. He was sent for a stress perfusion study which demonstrated an EF of 53%.  He has an EF of 53%.  There is a mid sized moderate intensity reversible inferior wall defect possibly ischemia and a medium-sized moderate severity basal anterolateral defect consistent with ischemia with an EF of 45-54% which was interpreted as high risk.Marland Kitchenased (45-54%).  Past Medical History  Diagnosis Date  . Hyperlipidemia   . Osteoarthritis   . LBP (low back pain)   . Insomnia   . BPH (benign prostatic hyperplasia)   . CTS (carpal tunnel syndrome)     better  . Sleep apnea     no cpap  . Blood transfusion without reported diagnosis 02/23/14    3 units for iron deficiency anemia    Past Surgical History  Procedure Laterality Date  . Total knee arthroplasty  2003    Right  . Lumbar fusion      x 3  . Carpal tunnel  release Bilateral 1996, 2004    Dr Lenoard Aden thumb surgery.   . Tonsillectomy  1946  . Eye surgery  2012    both cataracts, Lasik  . Colonoscopy  2005, 2012    diverticulosis.   . Esophagogastroduodenoscopy N/A 02/23/2014    Procedure: ESOPHAGOGASTRODUODENOSCOPY (EGD);  Surgeon: Irene Shipper, MD;  Location: [facility] ENDOSCOPY;  Service: Endoscopy;  Laterality: N/A;     Current Outpatient Prescriptions  Medication Sig Dispense Refill  . aspirin 81 MG chewable tablet Chew 81 mg by mouth at bedtime.    . Cyanocobalamin (VITAMIN B-12) 1000 MCG SUBL Place 1 tablet (1,000 mcg total) under the tongue daily. 100 tablet 3  . DULoxetine (CYMBALTA) 60 MG capsule Take 1 capsule (60 mg total) by mouth daily. 90 capsule 3  . finasteride (PROSCAR) 5 MG tablet TAKE 1 TABLET (5 MG TOTAL) BY MOUTH DAILY. 90 tablet 3  . folic acid (V-R FOLIC ACID) A999333 MCG tablet Take 1 tablet (400 mcg total) by mouth daily. 90 tablet 3  . furosemide (LASIX) 20 MG tablet Take 1-2 tablets (20-40 mg total) by mouth daily as needed for edema. 180 tablet 3  . iron polysaccharides (NIFEREX) 150 MG capsule Take 1 capsule (150 mg total) by mouth daily. 90 capsule 3  . nitroGLYCERIN (NITROSTAT) 0.4 MG SL tablet Place 1 tablet (0.4 mg total) under the tongue  every 5 (five) minutes as needed for chest pain. 20 tablet 3  . simvastatin (ZOCOR) 40 MG tablet Take 1 tablet (40 mg total) by mouth at bedtime. 90 tablet 3  . tamsulosin (FLOMAX) 0.4 MG CAPS capsule Take 1 capsule (0.4 mg total) by mouth daily. 90 capsule 3   No current facility-administered medications for this visit.    Allergies:   Review of patient's allergies indicates no known allergies.    Social History:  The patient  reports that he quit smoking about 28 years ago. He has never used smokeless tobacco. He reports that he drinks alcohol. He reports that he does not use illicit drugs.   Family History:  The patient's family history includes Alzheimer's disease in his  brother and father; Aneurysm in his father; Autoimmune disease in his son; CAD in his brother; Cancer in his brother; Cancer (age of onset: 77) in his mother; Heart disease (age of onset: 28) in his mother; Heart disease (age of onset: 27) in his father; Stomach cancer in his brother. There is no history of Colon cancer, Esophageal cancer, Pancreatic cancer, Prostate cancer, or Rectal cancer.    ROS:  Please see the history of present illness.   Otherwise, review of systems are positive for none.   All other systems are reviewed and negative.    PHYSICAL EXAM: VS:  BP 96/54 mmHg  Pulse 56  Ht 6' (1.829 m)  Wt 239 lb 6.4 oz (108.591 kg)  BMI 32.46 kg/m2 , BMI Body mass index is 32.46 kg/(m^2). GENERAL:  Well appearing HEENT:  Pupils equal round and reactive, fundi not visualized, oral mucosa unremarkable NECK:  No jugular venous distention, waveform within normal limits, carotid upstroke brisk and symmetric, no bruits, no thyromegaly LYMPHATICS:  No cervical, inguinal adenopathy LUNGS:  Clear to auscultation bilaterally BACK:  No CVA tenderness CHEST:  Unremarkable HEART:  PMI not displaced or sustained,S1 and S2 within normal limits, no S3, no S4, no clicks, no rubs, no murmurs ABD:  Flat, positive bowel sounds normal in frequency in pitch, no bruits, no rebound, no guarding, no midline pulsatile mass, no hepatomegaly, no splenomegaly EXT:  2 plus pulses throughout, no edema, no cyanosis no clubbing SKIN:  No rashes no nodules NEURO:  Cranial nerves II through XII grossly intact, motor grossly intact throughout PSYCH:  Cognitively intact, oriented to person place and time    EKG:  EKG is ordered today. The ekg ordered today demonstrates sinus rhythm, rate 56, axis within normal limits, intervals within normal limits, no acute ST-T wave changes.   Recent Labs: 07/17/2015: BUN 16; Creatinine, Ser 0.82; Hemoglobin 11.7*; Platelets 209.0; Potassium 4.7; Sodium 140 07/18/2015: TSH 0.49      Lipid Panel    Component Value Date/Time   CHOL 162 11/07/2013 1639   TRIG 122.0 11/07/2013 1639   TRIG 97 06/29/2006 0948   HDL 57.30 11/07/2013 1639   CHOLHDL 3 11/07/2013 1639   CHOLHDL 3.8 CALC 06/29/2006 0948   VLDL 24.4 11/07/2013 1639   LDLCALC 80 11/07/2013 1639   LDLDIRECT 157.5 07/05/2008 0906      Wt Readings from Last 3 Encounters:  08/13/15 239 lb 6.4 oz (108.591 kg)  08/07/15 238 lb (107.956 kg)  07/23/15 238 lb (107.956 kg)      Other studies Reviewed: Additional studies/ records that were reviewed today include: Lexiscan Myoview.. Review of the above records demonstrates:  Please see elsewhere in the note.     ASSESSMENT AND PLAN:  ABNORMAL  STRESS TEST:  The patient has symptoms and high risk nuclear study.  He will have a cardiac catheterization.The patient understands that risks included but are not limited to stroke (1 in 1000), death (1 in 10), kidney failure [usually temporary] (1 in 500), bleeding (1 in 200), allergic reaction [possibly serious] (1 in 200).  The patient understands and agrees to proceed.   ANEMIA:  Evaluated by GI and Heme without diagnosis.    Current medicines are reviewed at length with the patient today.  The patient does not have concerns regarding medicines.  The following changes have been made:  no change  Labs/ tests ordered today include:   Orders Placed This Encounter  Procedures  . CBC  . COMPLETE METABOLIC PANEL WITH GFR  . TSH  . Protime-INR  . PTT  . EKG 12-Lead  . LEFT HEART CATHETERIZATION WITH CORONARY ANGIOGRAM     Disposition:   FU with me after the cath.      Signed, Minus Breeding, MD  08/14/2015 9:13 AM    [facility]

## 2015-08-13 NOTE — Patient Instructions (Signed)
Your physician has requested that you have a cardiac catheterization. Cardiac catheterization is used to diagnose and/or treat various heart conditions. Doctors may recommend this procedure for a number of different reasons. The most common reason is to evaluate chest pain. Chest pain can be a symptom of coronary artery disease (CAD), and cardiac catheterization can show whether plaque is narrowing or blocking your heart's arteries. This procedure is also used to evaluate the valves, as well as measure the blood flow and oxygen levels in different parts of your heart. For further information please visit [email or web address]. Please follow instruction sheet, as given.  Your physician recommends that you return for lab work   

## 2015-08-14 ENCOUNTER — Ambulatory Visit
Admission: RE | Admit: 2015-08-14 | Discharge: 2015-08-14 | Disposition: A | Discharge status: Home / Self Care | Payer: Medicare Other | Source: Ambulatory Visit | Attending: Cardiology | Admitting: Cardiology

## 2015-08-14 ENCOUNTER — Other Ambulatory Visit: Payer: Self-pay | Admitting: *Deleted

## 2015-08-14 ENCOUNTER — Telehealth: Payer: Self-pay | Admitting: Cardiology

## 2015-08-14 DIAGNOSIS — R9439 Abnormal result of other cardiovascular function study: Secondary | ICD-10-CM | POA: Insufficient documentation

## 2015-08-14 DIAGNOSIS — R079 Chest pain, unspecified: Secondary | ICD-10-CM | POA: Diagnosis not present

## 2015-08-14 DIAGNOSIS — Z01810 Encounter for preprocedural cardiovascular examination: Secondary | ICD-10-CM

## 2015-08-14 DIAGNOSIS — Z01818 Encounter for other preprocedural examination: Secondary | ICD-10-CM

## 2015-08-14 LAB — CBC
HCT: 36.6 % — ABNORMAL LOW (ref 39.0–52.0)
Hemoglobin: 11.5 g/dL — ABNORMAL LOW (ref 13.0–17.0)
MCH: 25.7 pg — ABNORMAL LOW (ref 26.0–34.0)
MCHC: 31.4 g/dL (ref 30.0–36.0)
MCV: 81.7 fL (ref 78.0–100.0)
MPV: 10.2 fL (ref 8.6–12.4)
Platelets: 228 10*3/uL (ref 150–400)
RBC: 4.48 MIL/uL (ref 4.22–5.81)
RDW: 19 % — ABNORMAL HIGH (ref 11.5–15.5)
WBC: 5 10*3/uL (ref 4.0–10.5)

## 2015-08-14 LAB — COMPLETE METABOLIC PANEL WITH GFR
ALT: 14 U/L (ref 9–46)
AST: 17 U/L (ref 10–35)
Albumin: 4.2 g/dL (ref 3.6–5.1)
Alkaline Phosphatase: 55 U/L (ref 40–115)
BUN: 13 mg/dL (ref 7–25)
CO2: 28 mmol/L (ref 20–31)
Calcium: 9.4 mg/dL (ref 8.6–10.3)
Chloride: 104 mmol/L (ref 98–110)
Creat: 0.82 mg/dL (ref 0.70–1.18)
GFR, Est African American: >89 mL/min (ref >=60)
GFR, Est Non African American: 87 mL/min (ref >=60)
Glucose, Bld: 96 mg/dL (ref 65–99)
Potassium: 4.7 mmol/L (ref 3.5–5.3)
Sodium: 140 mmol/L (ref 135–146)
Total Bilirubin: 0.6 mg/dL (ref 0.2–1.2)
Total Protein: 6.4 g/dL (ref 6.1–8.1)

## 2015-08-14 LAB — TSH: TSH: 0.831 u[IU]/mL (ref 0.350–4.500)

## 2015-08-14 NOTE — Telephone Encounter (Signed)
Pt presented to [facility] for pre-cath CXR per our written procedure preparation instructions. Test ordered.

## 2015-08-15 ENCOUNTER — Encounter (HOSPITAL_COMMUNITY): Payer: Self-pay | Admitting: *Deleted

## 2015-08-15 ENCOUNTER — Encounter (HOSPITAL_COMMUNITY)
Admission: RE | Disposition: A | Discharge status: Home / Self Care | Payer: Self-pay | Source: Ambulatory Visit | Attending: Cardiovascular Disease

## 2015-08-15 ENCOUNTER — Ambulatory Visit (HOSPITAL_COMMUNITY)
Admission: RE | Admit: 2015-08-15 | Discharge: 2015-08-15 | Disposition: A | Discharge status: Home / Self Care | Payer: Medicare Other | Source: Ambulatory Visit | Attending: Cardiovascular Disease | Admitting: Cardiovascular Disease

## 2015-08-15 DIAGNOSIS — N400 Enlarged prostate without lower urinary tract symptoms: Secondary | ICD-10-CM | POA: Diagnosis not present

## 2015-08-15 DIAGNOSIS — Z8249 Family history of ischemic heart disease and other diseases of the circulatory system: Secondary | ICD-10-CM | POA: Insufficient documentation

## 2015-08-15 DIAGNOSIS — D649 Anemia, unspecified: Secondary | ICD-10-CM | POA: Diagnosis not present

## 2015-08-15 DIAGNOSIS — Z7982 Long term (current) use of aspirin: Secondary | ICD-10-CM | POA: Diagnosis not present

## 2015-08-15 DIAGNOSIS — R079 Chest pain, unspecified: Secondary | ICD-10-CM | POA: Insufficient documentation

## 2015-08-15 DIAGNOSIS — Z96651 Presence of right artificial knee joint: Secondary | ICD-10-CM | POA: Diagnosis not present

## 2015-08-15 DIAGNOSIS — Z87891 Personal history of nicotine dependence: Secondary | ICD-10-CM | POA: Insufficient documentation

## 2015-08-15 DIAGNOSIS — Z6832 Body mass index (BMI) 32.0-32.9, adult: Secondary | ICD-10-CM | POA: Insufficient documentation

## 2015-08-15 DIAGNOSIS — R9439 Abnormal result of other cardiovascular function study: Secondary | ICD-10-CM | POA: Diagnosis not present

## 2015-08-15 DIAGNOSIS — R0789 Other chest pain: Secondary | ICD-10-CM | POA: Insufficient documentation

## 2015-08-15 DIAGNOSIS — G4730 Sleep apnea, unspecified: Secondary | ICD-10-CM | POA: Diagnosis not present

## 2015-08-15 DIAGNOSIS — M1990 Unspecified osteoarthritis, unspecified site: Secondary | ICD-10-CM | POA: Insufficient documentation

## 2015-08-15 DIAGNOSIS — M545 Low back pain: Secondary | ICD-10-CM | POA: Diagnosis not present

## 2015-08-15 DIAGNOSIS — Z01818 Encounter for other preprocedural examination: Secondary | ICD-10-CM

## 2015-08-15 DIAGNOSIS — G4700 Insomnia, unspecified: Secondary | ICD-10-CM | POA: Diagnosis not present

## 2015-08-15 DIAGNOSIS — E785 Hyperlipidemia, unspecified: Secondary | ICD-10-CM | POA: Insufficient documentation

## 2015-08-15 DIAGNOSIS — E663 Overweight: Secondary | ICD-10-CM | POA: Diagnosis not present

## 2015-08-15 HISTORY — PX: CARDIAC CATHETERIZATION: SHX172

## 2015-08-15 LAB — PROTIME-INR
INR: 1.06 (ref <1.50)
Prothrombin Time: 13.9 seconds (ref 11.6–15.2)

## 2015-08-15 LAB — APTT: aPTT: 36 seconds (ref 24–37)

## 2015-08-15 SURGERY — LEFT HEART CATH AND CORONARY ANGIOGRAPHY

## 2015-08-15 MED ORDER — MIDAZOLAM HCL 2 MG/2ML IJ SOLN
INTRAMUSCULAR | Status: DC | PRN
  Administered 2015-08-15: 1 mg via INTRAVENOUS

## 2015-08-15 MED ORDER — HEPARIN SODIUM (PORCINE) 1000 UNIT/ML IJ SOLN
INTRAMUSCULAR | Status: DC | PRN
  Administered 2015-08-15: 5000 [IU] via INTRAVENOUS

## 2015-08-15 MED ORDER — VERAPAMIL HCL 2.5 MG/ML IV SOLN: INTRAVENOUS | Status: DC | PRN

## 2015-08-15 MED ORDER — SODIUM CHLORIDE 0.9 % IJ SOLN: 3.0000 mL | INTRAMUSCULAR | Status: DC | PRN

## 2015-08-15 MED ORDER — ACETAMINOPHEN 325 MG PO TABS: 650.0000 mg | ORAL_TABLET | ORAL | Status: DC | PRN

## 2015-08-15 MED ORDER — MORPHINE SULFATE (PF) 2 MG/ML IV SOLN: 2.0000 mg | INTRAVENOUS | Status: DC | PRN

## 2015-08-15 MED ORDER — SODIUM CHLORIDE 0.9 % IJ SOLN: 3.0000 mL | Freq: Two times a day (BID) | INTRAMUSCULAR | Status: DC

## 2015-08-15 MED ORDER — MIDAZOLAM HCL 2 MG/2ML IJ SOLN
INTRAMUSCULAR | Status: AC
  Filled 2015-08-15: qty 2

## 2015-08-15 MED ORDER — ASPIRIN 81 MG PO CHEW
CHEWABLE_TABLET | ORAL | Status: AC
  Filled 2015-08-15: qty 1

## 2015-08-15 MED ORDER — HEPARIN (PORCINE) IN NACL 2-0.9 UNIT/ML-% IJ SOLN
INTRAMUSCULAR | Status: AC
  Filled 2015-08-15: qty 1000

## 2015-08-15 MED ORDER — SODIUM CHLORIDE 0.9 % WEIGHT BASED INFUSION: 1.0000 mL/kg/h | INTRAVENOUS | Status: DC

## 2015-08-15 MED ORDER — HEPARIN SODIUM (PORCINE) 1000 UNIT/ML IJ SOLN
INTRAMUSCULAR | Status: AC
  Filled 2015-08-15: qty 1

## 2015-08-15 MED ORDER — ASPIRIN 81 MG PO CHEW: 81.0000 mg | CHEWABLE_TABLET | Freq: Every day | ORAL | Status: DC

## 2015-08-15 MED ORDER — ASPIRIN 81 MG PO CHEW
81.0000 mg | CHEWABLE_TABLET | Freq: Once | ORAL | Status: AC
  Administered 2015-08-15: 81 mg via ORAL

## 2015-08-15 MED ORDER — SODIUM CHLORIDE 0.9 % IV SOLN: 250.0000 mL | INTRAVENOUS | Status: DC | PRN

## 2015-08-15 MED ORDER — FENTANYL CITRATE (PF) 100 MCG/2ML IJ SOLN
INTRAMUSCULAR | Status: AC
  Filled 2015-08-15: qty 2

## 2015-08-15 MED ORDER — SODIUM CHLORIDE 0.9 % WEIGHT BASED INFUSION: 3.0000 mL/kg/h | INTRAVENOUS | Status: DC

## 2015-08-15 MED ORDER — NITROGLYCERIN 1 MG/10 ML FOR IR/CATH LAB
INTRA_ARTERIAL | Status: DC | PRN
Start: 2015-08-15 — End: 2015-08-15
  Administered 2015-08-15: 12:00:00

## 2015-08-15 MED ORDER — LIDOCAINE HCL (PF) 1 % IJ SOLN
INTRAMUSCULAR | Status: AC
  Filled 2015-08-15: qty 30

## 2015-08-15 MED ORDER — ONDANSETRON HCL 4 MG/2ML IJ SOLN: 4.0000 mg | Freq: Four times a day (QID) | INTRAMUSCULAR | Status: DC | PRN

## 2015-08-15 MED ORDER — FENTANYL CITRATE (PF) 100 MCG/2ML IJ SOLN
INTRAMUSCULAR | Status: DC | PRN
  Administered 2015-08-15: 25 ug via INTRAVENOUS

## 2015-08-15 MED ORDER — NITROGLYCERIN 1 MG/10 ML FOR IR/CATH LAB
INTRA_ARTERIAL | Status: AC
  Filled 2015-08-15: qty 10

## 2015-08-15 MED ORDER — VERAPAMIL HCL 2.5 MG/ML IV SOLN
INTRAVENOUS | Status: AC
  Filled 2015-08-15: qty 2

## 2015-08-15 MED ORDER — SODIUM CHLORIDE 0.9 % WEIGHT BASED INFUSION
3.0000 mL/kg/h | INTRAVENOUS | Status: DC
  Administered 2015-08-15: 3 mL/kg/h via INTRAVENOUS

## 2015-08-15 MED ORDER — LIDOCAINE HCL (PF) 1 % IJ SOLN
INTRAMUSCULAR | Status: DC | PRN
Start: 2015-08-15 — End: 2015-08-15
  Administered 2015-08-15: 12:00:00 via INTRA_ARTERIAL

## 2015-08-15 MED ORDER — IOHEXOL 350 MG/ML SOLN
INTRAVENOUS | Status: DC | PRN
  Administered 2015-08-15: 70 mL via INTRACARDIAC

## 2015-08-15 SURGICAL SUPPLY — 11 items

## 2015-08-15 NOTE — H&P (View-Only) (Signed)
Cardiology Office Note   Date:  08/14/2015   ID:  [name], DOB 1941-05-06, MRN [id number]  PCP:  [name], MD  Cardiologist:   Minus Breeding, MD   Chief Complaint  Patient presents with  . Chest Pain  . Abnormal stress test      History of Present Illness: [name] is a 74 y.o. male who presents for evaluation of an abnormal stress test, weakness. The patient has no prior cardiac history. I do note that he had a catheterization many years ago and apparently this was okay. However, he does have risk factors. He's been having some chest discomfort. This occurs sporadically. Slightly more frequent recently. Some mild gallbladder discomfort left of his sternum. It might last for 3-4 minutes. It does not radiate to his jaw or to his arms. He cannot bring it on with activities although he hasn't been particularly active because he's had decreased exercise tolerance. He denies any associated shortness of breath, PND or orthopnea.  The patient has also had worsening leg weakness and fatigue. He's had decreased exercise tolerance. He just feels tired most of the time. He was sent for a stress perfusion study which demonstrated an EF of 53%.  He has an EF of 53%.  There is a mid sized moderate intensity reversible inferior wall defect possibly ischemia and a medium-sized moderate severity basal anterolateral defect consistent with ischemia with an EF of 45-54% which was interpreted as high risk.Marland Kitchenased (45-54%).  Past Medical History  Diagnosis Date  . Hyperlipidemia   . Osteoarthritis   . LBP (low back pain)   . Insomnia   . BPH (benign prostatic hyperplasia)   . CTS (carpal tunnel syndrome)     better  . Sleep apnea     no cpap  . Blood transfusion without reported diagnosis 02/23/14    3 units for iron deficiency anemia    Past Surgical History  Procedure Laterality Date  . Total knee arthroplasty  2003    Right  . Lumbar fusion      x 3  . Carpal tunnel  release Bilateral 1996, 2004    Dr Lenoard Aden thumb surgery.   . Tonsillectomy  1946  . Eye surgery  2012    both cataracts, Lasik  . Colonoscopy  2005, 2012    diverticulosis.   . Esophagogastroduodenoscopy N/A 02/23/2014    Procedure: ESOPHAGOGASTRODUODENOSCOPY (EGD);  Surgeon: Irene Shipper, MD;  Location: [facility] ENDOSCOPY;  Service: Endoscopy;  Laterality: N/A;     Current Outpatient Prescriptions  Medication Sig Dispense Refill  . aspirin 81 MG chewable tablet Chew 81 mg by mouth at bedtime.    . Cyanocobalamin (VITAMIN B-12) 1000 MCG SUBL Place 1 tablet (1,000 mcg total) under the tongue daily. 100 tablet 3  . DULoxetine (CYMBALTA) 60 MG capsule Take 1 capsule (60 mg total) by mouth daily. 90 capsule 3  . finasteride (PROSCAR) 5 MG tablet TAKE 1 TABLET (5 MG TOTAL) BY MOUTH DAILY. 90 tablet 3  . folic acid (V-R FOLIC ACID) A999333 MCG tablet Take 1 tablet (400 mcg total) by mouth daily. 90 tablet 3  . furosemide (LASIX) 20 MG tablet Take 1-2 tablets (20-40 mg total) by mouth daily as needed for edema. 180 tablet 3  . iron polysaccharides (NIFEREX) 150 MG capsule Take 1 capsule (150 mg total) by mouth daily. 90 capsule 3  . nitroGLYCERIN (NITROSTAT) 0.4 MG SL tablet Place 1 tablet (0.4 mg total) under the tongue  every 5 (five) minutes as needed for chest pain. 20 tablet 3  . simvastatin (ZOCOR) 40 MG tablet Take 1 tablet (40 mg total) by mouth at bedtime. 90 tablet 3  . tamsulosin (FLOMAX) 0.4 MG CAPS capsule Take 1 capsule (0.4 mg total) by mouth daily. 90 capsule 3   No current facility-administered medications for this visit.    Allergies:   Review of patient's allergies indicates no known allergies.    Social History:  The patient  reports that he quit smoking about 28 years ago. He has never used smokeless tobacco. He reports that he drinks alcohol. He reports that he does not use illicit drugs.   Family History:  The patient's family history includes Alzheimer's disease in his  brother and father; Aneurysm in his father; Autoimmune disease in his son; CAD in his brother; Cancer in his brother; Cancer (age of onset: 54) in his mother; Heart disease (age of onset: 61) in his mother; Heart disease (age of onset: 65) in his father; Stomach cancer in his brother. There is no history of Colon cancer, Esophageal cancer, Pancreatic cancer, Prostate cancer, or Rectal cancer.    ROS:  Please see the history of present illness.   Otherwise, review of systems are positive for none.   All other systems are reviewed and negative.    PHYSICAL EXAM: VS:  BP 96/54 mmHg  Pulse 56  Ht 6' (1.829 m)  Wt 239 lb 6.4 oz (108.591 kg)  BMI 32.46 kg/m2 , BMI Body mass index is 32.46 kg/(m^2). GENERAL:  Well appearing HEENT:  Pupils equal round and reactive, fundi not visualized, oral mucosa unremarkable NECK:  No jugular venous distention, waveform within normal limits, carotid upstroke brisk and symmetric, no bruits, no thyromegaly LYMPHATICS:  No cervical, inguinal adenopathy LUNGS:  Clear to auscultation bilaterally BACK:  No CVA tenderness CHEST:  Unremarkable HEART:  PMI not displaced or sustained,S1 and S2 within normal limits, no S3, no S4, no clicks, no rubs, no murmurs ABD:  Flat, positive bowel sounds normal in frequency in pitch, no bruits, no rebound, no guarding, no midline pulsatile mass, no hepatomegaly, no splenomegaly EXT:  2 plus pulses throughout, no edema, no cyanosis no clubbing SKIN:  No rashes no nodules NEURO:  Cranial nerves II through XII grossly intact, motor grossly intact throughout PSYCH:  Cognitively intact, oriented to person place and time    EKG:  EKG is ordered today. The ekg ordered today demonstrates sinus rhythm, rate 56, axis within normal limits, intervals within normal limits, no acute ST-T wave changes.   Recent Labs: 07/17/2015: BUN 16; Creatinine, Ser 0.82; Hemoglobin 11.7*; Platelets 209.0; Potassium 4.7; Sodium 140 07/18/2015: TSH 0.49      Lipid Panel    Component Value Date/Time   CHOL 162 11/07/2013 1639   TRIG 122.0 11/07/2013 1639   TRIG 97 06/29/2006 0948   HDL 57.30 11/07/2013 1639   CHOLHDL 3 11/07/2013 1639   CHOLHDL 3.8 CALC 06/29/2006 0948   VLDL 24.4 11/07/2013 1639   LDLCALC 80 11/07/2013 1639   LDLDIRECT 157.5 07/05/2008 0906      Wt Readings from Last 3 Encounters:  08/13/15 239 lb 6.4 oz (108.591 kg)  08/07/15 238 lb (107.956 kg)  07/23/15 238 lb (107.956 kg)      Other studies Reviewed: Additional studies/ records that were reviewed today include: Lexiscan Myoview.. Review of the above records demonstrates:  Please see elsewhere in the note.     ASSESSMENT AND PLAN:  ABNORMAL  STRESS TEST:  The patient has symptoms and high risk nuclear study.  He will have a cardiac catheterization.The patient understands that risks included but are not limited to stroke (1 in 1000), death (1 in 30), kidney failure [usually temporary] (1 in 500), bleeding (1 in 200), allergic reaction [possibly serious] (1 in 200).  The patient understands and agrees to proceed.   ANEMIA:  Evaluated by GI and Heme without diagnosis.    Current medicines are reviewed at length with the patient today.  The patient does not have concerns regarding medicines.  The following changes have been made:  no change  Labs/ tests ordered today include:   Orders Placed This Encounter  Procedures  . CBC  . COMPLETE METABOLIC PANEL WITH GFR  . TSH  . Protime-INR  . PTT  . EKG 12-Lead  . LEFT HEART CATHETERIZATION WITH CORONARY ANGIOGRAM     Disposition:   FU with me after the cath.      Signed, Minus Breeding, MD  08/14/2015 9:13 AM    [facility]

## 2015-08-15 NOTE — Research (Signed)
CADLAD Informed Consent   Subject Name: [name]  Subject met inclusion and exclusion criteria.  The informed consent form, study requirements and expectations were reviewed with the subject and questions and concerns were addressed prior to the signing of the consent form.  The subject verbalized understanding of the trail requirements.  The subject agreed to participate in the CADLAD trial and signed the informed consent.  The informed consent was obtained prior to performance of any protocol-specific procedures for the subject.  A copy of the signed informed consent was given to the subject and a copy was placed in the subject's medical record.  [name], W 08/15/2015, 1055  

## 2015-08-15 NOTE — Interval H&P Note (Signed)
Cath Lab Visit (complete for each Cath Lab visit)  Clinical Evaluation Leading to the Procedure:   ACS: No.  Non-ACS:    Anginal Classification: CCS III  Anti-ischemic medical therapy: No Therapy  Non-Invasive Test Results: High-risk stress test findings: cardiac mortality >3%/year  Prior CABG: No previous CABG      History and Physical Interval Note:  08/15/2015 11:54 AM  Murlean Caller Sally  has presented today for surgery, with the diagnosis of cp  The various methods of treatment have been discussed with the patient and family. After consideration of risks, benefits and other options for treatment, the patient has consented to  Procedure(s): Left Heart Cath and Coronary Angiography (N/A) as a surgical intervention .  The patient's history has been reviewed, patient examined, no change in status, stable for surgery.  I have reviewed the patient's chart and labs.  Questions were answered to the patient's satisfaction.     Quay Burow

## 2015-08-15 NOTE — Discharge Instructions (Signed)
Radial Site Care °Refer to this sheet in the next few weeks. These instructions provide you with information about caring for yourself after your procedure. Your health care provider may also give you more specific instructions. Your treatment has been planned according to current medical practices, but problems sometimes occur. Call your health care provider if you have any problems or questions after your procedure. °WHAT TO EXPECT AFTER THE PROCEDURE °After your procedure, it is typical to have the following: °· Bruising at the radial site that usually fades within 1-2 weeks. °· Blood collecting in the tissue (hematoma) that may be painful to the touch. It should usually decrease in size and tenderness within 1-2 weeks. °HOME CARE INSTRUCTIONS °· Take medicines only as directed by your health care provider. °· You may shower 24-48 hours after the procedure or as directed by your health care provider. Remove the bandage (dressing) and gently wash the site with plain soap and water. Pat the area dry with a clean towel. Do not rub the site, because this may cause bleeding. °· Do not take baths, swim, or use a hot tub until your health care provider approves. °· Check your insertion site every day for redness, swelling, or drainage. °· Do not apply powder or lotion to the site. °· Do not flex or bend the affected arm for 24 hours or as directed by your health care provider. °· Do not push or pull heavy objects with the affected arm for 24 hours or as directed by your health care provider. °· Do not lift over 10 lb (4.5 kg) for 5 days after your procedure or as directed by your health care provider. °· Ask your health care provider when it is okay to: °¨ Return to work or school. °¨ Resume usual physical activities or sports. °¨ Resume sexual activity. °· Do not drive home if you are discharged the same day as the procedure. Have someone else drive you. °· You may drive 24 hours after the procedure unless otherwise  instructed by your health care provider. °· Do not operate machinery or power tools for 24 hours after the procedure. °· If your procedure was done as an outpatient procedure, which means that you went home the same day as your procedure, a responsible adult should be with you for the first 24 hours after you arrive home. °· Keep all follow-up visits as directed by your health care provider. This is important. °SEEK MEDICAL CARE IF: °· You have a fever. °· You have chills. °· You have increased bleeding from the radial site. Hold pressure on the site and call 911. °SEEK IMMEDIATE MEDICAL CARE IF: °· You have unusual pain at the radial site. °· You have redness, warmth, or swelling at the radial site. °· You have drainage (other than a small amount of blood on the dressing) from the radial site. °· The radial site is bleeding, and the bleeding does not stop after 30 minutes of holding steady pressure on the site. °· Your arm or hand becomes pale, cool, tingly, or numb. °  °This information is not intended to replace advice given to you by your health care provider. Make sure you discuss any questions you have with your health care provider. °  °Document Released: 09/13/2010 Document Revised: 09/01/2014 Document Reviewed: 02/27/2014 °Elsevier Interactive Patient Education ©2016 [employer] ° °

## 2015-08-16 ENCOUNTER — Encounter (HOSPITAL_COMMUNITY): Payer: Self-pay | Admitting: Cardiovascular Disease

## 2015-08-28 DIAGNOSIS — H0012 Chalazion right lower eyelid: Secondary | ICD-10-CM | POA: Diagnosis not present

## 2015-08-28 DIAGNOSIS — H43391 Other vitreous opacities, right eye: Secondary | ICD-10-CM | POA: Diagnosis not present

## 2015-08-29 ENCOUNTER — Encounter (INDEPENDENT_AMBULATORY_CARE_PROVIDER_SITE_OTHER): Payer: Medicare Other | Admitting: Ophthalmology

## 2015-08-29 DIAGNOSIS — H43813 Vitreous degeneration, bilateral: Secondary | ICD-10-CM

## 2015-09-06 ENCOUNTER — Ambulatory Visit: Payer: Medicare Other | Admitting: Internal Medicine

## 2015-09-06 DIAGNOSIS — H0012 Chalazion right lower eyelid: Secondary | ICD-10-CM | POA: Diagnosis not present

## 2015-09-06 DIAGNOSIS — H04121 Dry eye syndrome of right lacrimal gland: Secondary | ICD-10-CM | POA: Diagnosis not present

## 2015-09-07 ENCOUNTER — Other Ambulatory Visit: Payer: Self-pay | Admitting: Internal Medicine

## 2015-09-12 ENCOUNTER — Ambulatory Visit: Payer: Medicare Other | Admitting: Internal Medicine

## 2015-09-14 ENCOUNTER — Encounter: Payer: Self-pay | Admitting: Internal Medicine

## 2015-09-14 ENCOUNTER — Other Ambulatory Visit (INDEPENDENT_AMBULATORY_CARE_PROVIDER_SITE_OTHER): Payer: Medicare Other

## 2015-09-14 ENCOUNTER — Ambulatory Visit (INDEPENDENT_AMBULATORY_CARE_PROVIDER_SITE_OTHER): Payer: Medicare Other | Admitting: Internal Medicine

## 2015-09-14 VITALS — BP 100/64 | HR 69 | Wt 235.0 lb

## 2015-09-14 DIAGNOSIS — D509 Iron deficiency anemia, unspecified: Secondary | ICD-10-CM

## 2015-09-14 DIAGNOSIS — M79604 Pain in right leg: Secondary | ICD-10-CM | POA: Diagnosis not present

## 2015-09-14 DIAGNOSIS — I10 Essential (primary) hypertension: Secondary | ICD-10-CM

## 2015-09-14 DIAGNOSIS — M79605 Pain in left leg: Secondary | ICD-10-CM | POA: Diagnosis not present

## 2015-09-14 DIAGNOSIS — I739 Peripheral vascular disease, unspecified: Secondary | ICD-10-CM | POA: Diagnosis not present

## 2015-09-14 LAB — CBC WITH DIFFERENTIAL/PLATELET
Basophils Absolute: 0.1 10*3/uL (ref 0.0–0.1)
Basophils Relative: 0.9 % (ref 0.0–3.0)
Eosinophils Absolute: 0.2 10*3/uL (ref 0.0–0.7)
Eosinophils Relative: 2.6 % (ref 0.0–5.0)
HCT: 37.6 % — ABNORMAL LOW (ref 39.0–52.0)
Hemoglobin: 12 g/dL — ABNORMAL LOW (ref 13.0–17.0)
Lymphocytes Relative: 24.6 % (ref 12.0–46.0)
Lymphs Abs: 1.5 10*3/uL (ref 0.7–4.0)
MCHC: 31.8 g/dL (ref 30.0–36.0)
MCV: 81.5 fl (ref 78.0–100.0)
Monocytes Absolute: 1 10*3/uL (ref 0.1–1.0)
Monocytes Relative: 16.3 % — ABNORMAL HIGH (ref 3.0–12.0)
Neutro Abs: 3.3 10*3/uL (ref 1.4–7.7)
Neutrophils Relative %: 55.6 % (ref 43.0–77.0)
Platelets: 259 10*3/uL (ref 150.0–400.0)
RBC: 4.61 Mil/uL (ref 4.22–5.81)
RDW: 18.2 % — ABNORMAL HIGH (ref 11.5–15.5)
WBC: 5.9 10*3/uL (ref 4.0–10.5)

## 2015-09-14 LAB — BASIC METABOLIC PANEL
BUN: 16 mg/dL (ref 6–23)
CO2: 31 mEq/L (ref 19–32)
Calcium: 9.9 mg/dL (ref 8.4–10.5)
Chloride: 103 mEq/L (ref 96–112)
Creatinine, Ser: 0.82 mg/dL (ref 0.40–1.50)
GFR: 97.48 mL/min (ref >60.00)
Glucose, Bld: 102 mg/dL — ABNORMAL HIGH (ref 70–99)
Potassium: 4.4 mEq/L (ref 3.5–5.1)
Sodium: 140 mEq/L (ref 135–145)

## 2015-09-14 LAB — IBC PANEL
Iron: 38 ug/dL — ABNORMAL LOW (ref 42–165)
Saturation Ratios: 8.5 % — ABNORMAL LOW (ref 20.0–50.0)
Transferrin: 319 mg/dL (ref 212.0–360.0)

## 2015-09-14 MED ORDER — PITAVASTATIN CALCIUM 4 MG PO TABS: ORAL_TABLET | ORAL | Status: DC

## 2015-09-14 NOTE — Assessment & Plan Note (Signed)
1/17 LLE>RLE claudication -?neurologic vs statin induced. Doubt PVD D/c Simvastatin x 2 weeks Start Livalo Neurol ref Art Doppler

## 2015-09-14 NOTE — Progress Notes (Signed)
Pre visit review using our clinic review tool, if applicable. No additional management support is needed unless otherwise documented below in the visit note. 

## 2015-09-14 NOTE — Patient Instructions (Signed)
Stop Simvastatin Start Livalo in 2 weeks

## 2015-09-14 NOTE — Progress Notes (Signed)
Subjective:  Patient ID: [name], male    DOB: 17-Dec-1940  Age: 75 y.o. MRN: [id number]  CC: No chief complaint on file.   HPI [name] presents for leg weakness L>R (no pain) - he has to stop after walking 1-2 blocks. Weak legs to go up the steps. LBP is mild.  Outpatient Prescriptions Prior to Visit  Medication Sig Dispense Refill  . aspirin 81 MG chewable tablet Chew 81 mg by mouth at bedtime.    . Cyanocobalamin (VITAMIN B-12) 1000 MCG SUBL Place 1 tablet (1,000 mcg total) under the tongue daily. 100 tablet 3  . DULoxetine (CYMBALTA) 60 MG capsule Take 1 capsule (60 mg total) by mouth daily. 90 capsule 3  . finasteride (PROSCAR) 5 MG tablet TAKE 1 TABLET (5 MG TOTAL) BY MOUTH DAILY. 90 tablet 3  . folic acid (V-R FOLIC ACID) A999333 MCG tablet Take 1 tablet (400 mcg total) by mouth daily. 90 tablet 3  . furosemide (LASIX) 20 MG tablet Take 1-2 tablets (20-40 mg total) by mouth daily as needed for edema. 180 tablet 3  . iron polysaccharides (NIFEREX) 150 MG capsule Take 1 capsule (150 mg total) by mouth daily. 90 capsule 3  . nitroGLYCERIN (NITROSTAT) 0.4 MG SL tablet Place 1 tablet (0.4 mg total) under the tongue every 5 (five) minutes as needed for chest pain. 20 tablet 3  . simvastatin (ZOCOR) 40 MG tablet Take 1 tablet (40 mg total) by mouth at bedtime. 90 tablet 3  . tamsulosin (FLOMAX) 0.4 MG CAPS capsule Take 1 capsule (0.4 mg total) by mouth daily. 90 capsule 3  . tamsulosin (FLOMAX) 0.4 MG CAPS capsule TAKE 1 CAPSULE (0.4 MG TOTAL) BY MOUTH DAILY. 90 capsule 0   No facility-administered medications prior to visit.    ROS Review of Systems  Constitutional: Negative for appetite change, fatigue and unexpected weight change.  HENT: Negative for congestion, nosebleeds, sneezing, sore throat and trouble swallowing.   Eyes: Negative for itching and visual disturbance.  Respiratory: Negative for cough.   Cardiovascular: Negative for chest pain, palpitations and  leg swelling.  Gastrointestinal: Negative for nausea, diarrhea, blood in stool and abdominal distention.  Genitourinary: Negative for frequency and hematuria.  Musculoskeletal: Positive for gait problem. Negative for back pain, joint swelling and neck pain.  Skin: Negative for rash.  Neurological: Positive for weakness. Negative for dizziness, tremors and speech difficulty.  Psychiatric/Behavioral: Negative for suicidal ideas, sleep disturbance, dysphoric mood and agitation. The patient is not nervous/anxious.   LBP  Objective:  BP 100/64 mmHg  Pulse 69  Wt 235 lb (106.595 kg)  SpO2 94%  BP Readings from Last 3 Encounters:  09/14/15 100/64  08/15/15 118/82  08/13/15 96/54    Wt Readings from Last 3 Encounters:  09/14/15 235 lb (106.595 kg)  08/15/15 235 lb (106.595 kg)  08/13/15 239 lb 6.4 oz (108.591 kg)    Physical Exam  Constitutional: He is oriented to person, place, and time. He appears well-developed. No distress.  NAD  HENT:  Mouth/Throat: Oropharynx is clear and moist.  Eyes: Conjunctivae are normal. Pupils are equal, round, and reactive to light.  Neck: Normal range of motion. No JVD present. No thyromegaly present.  Cardiovascular: Normal rate, regular rhythm, normal heart sounds and intact distal pulses.  Exam reveals no gallop and no friction rub.   No murmur heard. Pulmonary/Chest: Effort normal and breath sounds normal. No respiratory distress. He has no wheezes. He has no rales. He exhibits  no tenderness.  Abdominal: Soft. Bowel sounds are normal. He exhibits no distension and no mass. There is no tenderness. There is no rebound and no guarding.  Musculoskeletal: Normal range of motion. He exhibits tenderness. He exhibits no edema.  Lymphadenopathy:    He has no cervical adenopathy.  Neurological: He is alert and oriented to person, place, and time. He has normal reflexes. No cranial nerve deficit. He exhibits normal muscle tone. He displays a negative Romberg  sign. Coordination abnormal. Gait normal.  Skin: Skin is warm and dry. No rash noted.  Psychiatric: He has a normal mood and affect. His behavior is normal. Judgment and thought content normal.  L leg MS is 4+/5  Lab Results  Component Value Date   WBC 5.0 08/13/2015   HGB 11.5* 08/13/2015   HCT 36.6* 08/13/2015   PLT 228 08/13/2015   GLUCOSE 96 08/13/2015   CHOL 162 11/07/2013   TRIG 122.0 11/07/2013   HDL 57.30 11/07/2013   LDLDIRECT 157.5 07/05/2008   LDLCALC 80 11/07/2013   ALT 14 08/13/2015   AST 17 08/13/2015   NA 140 08/13/2015   K 4.7 08/13/2015   CL 104 08/13/2015   CREATININE 0.82 08/13/2015   BUN 13 08/13/2015   CO2 28 08/13/2015   TSH 0.831 08/13/2015   PSA 0.49 03/01/2012   INR 1.06 08/13/2015    Dg Chest 2 View  08/14/2015  CLINICAL DATA:  Several day history of and chest pain, pre cardiac catheterization study, history of dyslipidemia and previous tobacco use EXAM: CHEST  2 VIEW COMPARISON:  Portable chest x-ray of March 06, 2014 FINDINGS: There is chronic elevation of the left hemidiaphragm. The pulmonary interstitial markings are coarse bilaterally but stable. There is no alveolar infiltrate. There is no pleural effusion. The heart is normal in size. The pulmonary vascularity is not engorged. There is stable tortuosity of the descending thoracic aorta. There is stable moderate dextro curvature of the thoracic spine centered at approximately T10. There are old fractures of the left seventh and eighth ribs posteriorly. There is multilevel degenerative disc disease of the thoracic spine. IMPRESSION: Mild chronic pulmonary interstitial prominence. This likely reflects the patient's smoking history. There is no evidence of pneumonia, CHF, nor other acute cardiopulmonary abnormality. Electronically Signed   By: David  Martinique M.D.   On: 08/14/2015 10:54    Assessment & Plan:   There are no diagnoses linked to this encounter. I am having Mr. Bomberger maintain his  Vitamin 0000000, aspirin, folic acid, DULoxetine, finasteride, furosemide, iron polysaccharides, tamsulosin, simvastatin, and nitroGLYCERIN.  No orders of the defined types were placed in this encounter.     Follow-up: No Follow-up on file.  [name], MD

## 2015-09-18 ENCOUNTER — Other Ambulatory Visit: Payer: Self-pay | Admitting: Internal Medicine

## 2015-09-18 DIAGNOSIS — M79605 Pain in left leg: Secondary | ICD-10-CM

## 2015-09-18 DIAGNOSIS — M79604 Pain in right leg: Secondary | ICD-10-CM

## 2015-09-18 DIAGNOSIS — I739 Peripheral vascular disease, unspecified: Secondary | ICD-10-CM

## 2015-09-26 ENCOUNTER — Ambulatory Visit (HOSPITAL_COMMUNITY)
Admission: RE | Admit: 2015-09-26 | Discharge: 2015-09-26 | Disposition: A | Discharge status: Home / Self Care | Payer: Medicare Other | Source: Ambulatory Visit | Attending: Internal Medicine | Admitting: Internal Medicine

## 2015-09-26 DIAGNOSIS — M79605 Pain in left leg: Secondary | ICD-10-CM | POA: Insufficient documentation

## 2015-09-26 DIAGNOSIS — I739 Peripheral vascular disease, unspecified: Secondary | ICD-10-CM | POA: Insufficient documentation

## 2015-09-26 DIAGNOSIS — M79604 Pain in right leg: Secondary | ICD-10-CM

## 2015-09-26 DIAGNOSIS — E785 Hyperlipidemia, unspecified: Secondary | ICD-10-CM | POA: Insufficient documentation

## 2015-10-02 DIAGNOSIS — H524 Presbyopia: Secondary | ICD-10-CM | POA: Diagnosis not present

## 2015-10-02 DIAGNOSIS — H52223 Regular astigmatism, bilateral: Secondary | ICD-10-CM | POA: Diagnosis not present

## 2015-10-08 DIAGNOSIS — J069 Acute upper respiratory infection, unspecified: Secondary | ICD-10-CM | POA: Diagnosis not present

## 2015-10-08 DIAGNOSIS — B9789 Other viral agents as the cause of diseases classified elsewhere: Secondary | ICD-10-CM | POA: Diagnosis not present

## 2015-10-10 ENCOUNTER — Encounter (INDEPENDENT_AMBULATORY_CARE_PROVIDER_SITE_OTHER): Payer: Medicare Other | Admitting: Ophthalmology

## 2015-10-10 DIAGNOSIS — H43813 Vitreous degeneration, bilateral: Secondary | ICD-10-CM

## 2015-10-15 ENCOUNTER — Ambulatory Visit (INDEPENDENT_AMBULATORY_CARE_PROVIDER_SITE_OTHER): Payer: Medicare Other | Admitting: Neurology

## 2015-10-15 ENCOUNTER — Encounter: Payer: Self-pay | Admitting: Neurology

## 2015-10-15 VITALS — BP 118/80 | HR 63 | Ht 72.0 in | Wt 238.2 lb

## 2015-10-15 DIAGNOSIS — M25551 Pain in right hip: Secondary | ICD-10-CM | POA: Diagnosis not present

## 2015-10-15 DIAGNOSIS — M25552 Pain in left hip: Secondary | ICD-10-CM

## 2015-10-15 NOTE — Patient Instructions (Signed)
Your pain seems to be coming more from your hip than low back.  I will send a note to your primary care provider that I recommend a referral to orthopeadics for further evaluation.

## 2015-10-15 NOTE — Progress Notes (Signed)
Follow-up Visit   Date: 10/15/2015    [name] MRN: [id number] DOB: 19-Jun-1941   Interim History: [name] is a 75 y.o. right-handed Caucasian male with hypertension, anemia, hyperlipidemia, CAD, lumbar fusion at L3-L5 (2007), and BPH returning to the clinic for new complaints of leg pain.  The patient was accompanied to the clinic by wife who also provides collateral information.    History of present illness: Early 2016, he developed intermittent tingling over the left side of his face, neck, and shoulder. Tingling sensation only involves the left jaw without involvement into his cheek, forehead, or ear. It lasts about 1-2 minutes and was occuring many times per day. Since starting gabapentin, it now occurs 1-2 times per day and lasts less than a minute. He denies any associated headache, changes in vision/taste/hearing, neck pain, or facial weakness. He was started on gabapentin 100mg  TID and has since reduced it to once daily because he is doing well.   He reports having chronic weakness of the left shoulder after having shoulder surgery.  UPDATE 10/15/2015:  He is referred with hew complains of bilateral leg weakness and pain.  Since early 2016, he began experiencing bilateral hip pain and weakness especially when walking.  Within about 2 minutes of walking, especially worse if walking up an incline his hip pain is provoked and he is unable to move the legs, because they are "stuck and weak".  Pain is described as soreness and achy pain over the hips. If he rests for 1-2 minutes (sitting or leaning his back up against the wall), the discomfort is quickly alleviated.   He does not have numbness, tingling, low back pain, or electrical pain.  No falls associated with his hip pain.  He has not tried tylenol, iburpofen, or ice. He does not have any discomfort when riding his bike for prolonged periods of time.   Medications:  Current Outpatient Prescriptions on File  Prior to Visit  Medication Sig Dispense Refill  . aspirin 81 MG chewable tablet Chew 81 mg by mouth at bedtime.    . Cyanocobalamin (VITAMIN B-12) 1000 MCG SUBL Place 1 tablet (1,000 mcg total) under the tongue daily. 100 tablet 3  . DULoxetine (CYMBALTA) 60 MG capsule Take 1 capsule (60 mg total) by mouth daily. 90 capsule 3  . finasteride (PROSCAR) 5 MG tablet TAKE 1 TABLET (5 MG TOTAL) BY MOUTH DAILY. 90 tablet 3  . folic acid (V-R FOLIC ACID) A999333 MCG tablet Take 1 tablet (400 mcg total) by mouth daily. 90 tablet 3  . furosemide (LASIX) 20 MG tablet Take 1-2 tablets (20-40 mg total) by mouth daily as needed for edema. 180 tablet 3  . nitroGLYCERIN (NITROSTAT) 0.4 MG SL tablet Place 1 tablet (0.4 mg total) under the tongue every 5 (five) minutes as needed for chest pain. 20 tablet 3  . Pitavastatin Calcium (LIVALO) 4 MG TABS 1 po qd 30 tablet 11  . tamsulosin (FLOMAX) 0.4 MG CAPS capsule Take 1 capsule (0.4 mg total) by mouth daily. 90 capsule 3   No current facility-administered medications on file prior to visit.    Allergies: No Known Allergies  Review of Systems:  CONSTITUTIONAL: No fevers, chills, night sweats, or weight loss.  EYES: No visual changes or eye pain ENT: No hearing changes.  No history of nose bleeds.   RESPIRATORY: No cough, wheezing and shortness of breath.   CARDIOVASCULAR: Negative for chest pain, and palpitations.   GI: Negative for  abdominal discomfort, blood in stools or black stools.  No recent change in bowel habits.   GU:  No history of incontinence.   MUSCLOSKELETAL: No history of joint pain or swelling.  No myalgias.   SKIN: Negative for lesions, rash, and itching.   ENDOCRINE: Negative for cold or heat intolerance, polydipsia or goiter.   PSYCH:  No depression or anxiety symptoms.   NEURO: As Above.   Vital Signs:  BP 118/80 mmHg  Pulse 63  Ht 6' (1.829 m)  Wt 238 lb 3 oz (108.041 kg)  BMI 32.30 kg/m2  SpO2 97% Neurological Exam: MENTAL STATUS  including orientation to time, place, person, recent and remote memory, attention span and concentration, language, and fund of knowledge is normal.  Speech is not dysarthric.  CRANIAL NERVES: No visual field defects.  Pupils equal round and reactive to light.  Normal conjugate, extra-ocular eye movements in all directions of gaze.  No ptosis. Normal facial sensation.  Face is symmetric. Palate elevates symmetrically.  Tongue is midline.  MOTOR:  Motor strength is 5/5 in all extremities.  No atrophy, fasciculations or abnormal movements.  No pronator drift.  Tone is normal.   FABER testing positive bilaterally.  MSRs:  Reflexes are 2+/4 throughout.  SENSORY:  Intact to pin prick throughout.  COORDINATION/GAIT:    Gait narrow based and stable. Stressed gait intact.  Mild unsteadiness with tandem gait.  Data: MRI brain and cervical spine wo contrast 12/14/2014: 1. No acute intracranial abnormality. Moderate for age nonspecific white matter signal changes most commonly due to chronic small vessel disease. 2. Chronic widespread multifactorial cervical spinal stenosis has progressed since 2009. Associated mass effect on the spinal cord has increased at both C3-C4 and C4-C5, but there is no definite cord edema or myelomalacia. 3. Progressed moderate or severe multifactorial neural foraminal stenosis at the right C3, bilateral C4, and bilateral C5 nerve levels.  IMPRESSION/PLAN: 1. Bilateral hip pain more suggestive of hip-related etiology, moreso than neurogenic claudication  - He has had previous lumbar fusion [id number] which we can certainly reimage, if his hip evaluation returns negative, but at this time there is no clear indication for further neurological work-up based on his history and exam.   - I will ask his PCP to refer patient to an orthopaedics hip specialist.  2. Multilevel cervical spinal stenosis, currently asymptomatic  3. Facial paresthesias, resolved  Return to clinic as  needed   The duration of this appointment visit was 30 minutes of face-to-face time with the patient.  Greater than 50% of this time was spent in counseling, explanation of diagnosis, planning of further management, and coordination of care.   Thank you for allowing me to participate in patient's care.  If I can answer any additional questions, I would be pleased to do so.    Sincerely,    Donika K. Posey Pronto, DO

## 2015-10-26 ENCOUNTER — Ambulatory Visit: Payer: Medicare Other | Admitting: Internal Medicine

## 2015-11-13 DIAGNOSIS — H26493 Other secondary cataract, bilateral: Secondary | ICD-10-CM | POA: Diagnosis not present

## 2015-11-13 DIAGNOSIS — H04123 Dry eye syndrome of bilateral lacrimal glands: Secondary | ICD-10-CM | POA: Diagnosis not present

## 2015-11-13 DIAGNOSIS — H5319 Other subjective visual disturbances: Secondary | ICD-10-CM | POA: Diagnosis not present

## 2015-11-13 DIAGNOSIS — H524 Presbyopia: Secondary | ICD-10-CM | POA: Diagnosis not present

## 2015-11-19 ENCOUNTER — Ambulatory Visit (INDEPENDENT_AMBULATORY_CARE_PROVIDER_SITE_OTHER): Payer: Medicare Other | Admitting: Internal Medicine

## 2015-11-19 ENCOUNTER — Other Ambulatory Visit (INDEPENDENT_AMBULATORY_CARE_PROVIDER_SITE_OTHER): Payer: Medicare Other

## 2015-11-19 ENCOUNTER — Encounter: Payer: Self-pay | Admitting: Internal Medicine

## 2015-11-19 VITALS — BP 102/66 | HR 67 | Wt 234.0 lb

## 2015-11-19 DIAGNOSIS — I739 Peripheral vascular disease, unspecified: Secondary | ICD-10-CM

## 2015-11-19 DIAGNOSIS — I25119 Atherosclerotic heart disease of native coronary artery with unspecified angina pectoris: Secondary | ICD-10-CM | POA: Diagnosis not present

## 2015-11-19 DIAGNOSIS — E538 Deficiency of other specified B group vitamins: Secondary | ICD-10-CM | POA: Diagnosis not present

## 2015-11-19 DIAGNOSIS — M5440 Lumbago with sciatica, unspecified side: Secondary | ICD-10-CM

## 2015-11-19 LAB — CBC WITH DIFFERENTIAL/PLATELET
Basophils Absolute: 0 10*3/uL (ref 0.0–0.1)
Basophils Relative: 0.6 % (ref 0.0–3.0)
Eosinophils Absolute: 0.2 10*3/uL (ref 0.0–0.7)
Eosinophils Relative: 2.7 % (ref 0.0–5.0)
HCT: 36.2 % — ABNORMAL LOW (ref 39.0–52.0)
Hemoglobin: 11.7 g/dL — ABNORMAL LOW (ref 13.0–17.0)
Lymphocytes Relative: 23.6 % (ref 12.0–46.0)
Lymphs Abs: 1.6 10*3/uL (ref 0.7–4.0)
MCHC: 32.4 g/dL (ref 30.0–36.0)
MCV: 80.4 fl (ref 78.0–100.0)
Monocytes Absolute: 1.1 10*3/uL — ABNORMAL HIGH (ref 0.1–1.0)
Monocytes Relative: 15.8 % — ABNORMAL HIGH (ref 3.0–12.0)
Neutro Abs: 3.9 10*3/uL (ref 1.4–7.7)
Neutrophils Relative %: 57.3 % (ref 43.0–77.0)
Platelets: 232 10*3/uL (ref 150.0–400.0)
RBC: 4.5 Mil/uL (ref 4.22–5.81)
RDW: 16.4 % — ABNORMAL HIGH (ref 11.5–15.5)
WBC: 6.8 10*3/uL (ref 4.0–10.5)

## 2015-11-19 LAB — BASIC METABOLIC PANEL
BUN: 20 mg/dL (ref 6–23)
CO2: 30 mEq/L (ref 19–32)
Calcium: 9.8 mg/dL (ref 8.4–10.5)
Chloride: 103 mEq/L (ref 96–112)
Creatinine, Ser: 0.79 mg/dL (ref 0.40–1.50)
GFR: 101.71 mL/min (ref >60.00)
Glucose, Bld: 108 mg/dL — ABNORMAL HIGH (ref 70–99)
Potassium: 4.3 mEq/L (ref 3.5–5.1)
Sodium: 139 mEq/L (ref 135–145)

## 2015-11-19 MED ORDER — TRAMADOL HCL 50 MG PO TABS: 50.0000 mg | ORAL_TABLET | Freq: Two times a day (BID) | ORAL | Status: DC | PRN

## 2015-11-19 MED ORDER — RANITIDINE HCL 150 MG PO TABS: 150.0000 mg | ORAL_TABLET | Freq: Every day | ORAL | Status: DC

## 2015-11-19 NOTE — Progress Notes (Signed)
Pre visit review using our clinic review tool, if applicable. No additional management support is needed unless otherwise documented below in the visit note. 

## 2015-11-19 NOTE — Progress Notes (Signed)
Subjective:  Patient ID: [name], male    DOB: 1941/02/21  Age: 75 y.o. MRN: [id number]  CC: No chief complaint on file.   HPI [name] presents for CP, fatigue. Heart cath was nl - Dr [name] thought it could be related to his esophagus... Pt had some CP after eating potato chips C/o hips pain/ stiffness - he has to stop and lean against something to re-start walking  Outpatient Prescriptions Prior to Visit  Medication Sig Dispense Refill  . aspirin 81 MG chewable tablet Chew 81 mg by mouth at bedtime.    . Cyanocobalamin (VITAMIN B-12) 1000 MCG SUBL Place 1 tablet (1,000 mcg total) under the tongue daily. 100 tablet 3  . DULoxetine (CYMBALTA) 60 MG capsule Take 1 capsule (60 mg total) by mouth daily. 90 capsule 3  . finasteride (PROSCAR) 5 MG tablet TAKE 1 TABLET (5 MG TOTAL) BY MOUTH DAILY. 90 tablet 3  . folic acid (V-R FOLIC ACID) A999333 MCG tablet Take 1 tablet (400 mcg total) by mouth daily. 90 tablet 3  . furosemide (LASIX) 20 MG tablet Take 1-2 tablets (20-40 mg total) by mouth daily as needed for edema. 180 tablet 3  . nitroGLYCERIN (NITROSTAT) 0.4 MG SL tablet Place 1 tablet (0.4 mg total) under the tongue every 5 (five) minutes as needed for chest pain. 20 tablet 3  . simvastatin (ZOCOR) 40 MG tablet TAKE 1 TABLET (40 MG TOTAL) BY MOUTH AT BEDTIME.  3  . tamsulosin (FLOMAX) 0.4 MG CAPS capsule Take 1 capsule (0.4 mg total) by mouth daily. 90 capsule 3  . Pitavastatin Calcium (LIVALO) 4 MG TABS 1 po qd 30 tablet 11   No facility-administered medications prior to visit.    ROS Review of Systems  Constitutional: Negative for appetite change, fatigue and unexpected weight change.  HENT: Negative for congestion, nosebleeds, sneezing, sore throat and trouble swallowing.   Eyes: Negative for itching and visual disturbance.  Respiratory: Positive for shortness of breath. Negative for cough and chest tightness.   Cardiovascular: Positive for chest pain. Negative  for palpitations and leg swelling.  Gastrointestinal: Negative for nausea, diarrhea, blood in stool and abdominal distention.  Genitourinary: Negative for frequency and hematuria.  Musculoskeletal: Positive for arthralgias and gait problem. Negative for back pain, joint swelling and neck pain.  Skin: Negative for rash.  Neurological: Negative for dizziness, tremors, speech difficulty and weakness.  Psychiatric/Behavioral: Negative for suicidal ideas, sleep disturbance, dysphoric mood and agitation. The patient is not nervous/anxious.     Objective:  BP 102/66 mmHg  Pulse 67  Wt 234 lb (106.142 kg)  SpO2 97%  BP Readings from Last 3 Encounters:  11/19/15 102/66  10/15/15 118/80  09/14/15 100/64    Wt Readings from Last 3 Encounters:  11/19/15 234 lb (106.142 kg)  10/15/15 238 lb 3 oz (108.041 kg)  09/14/15 235 lb (106.595 kg)    Physical Exam  Constitutional: He is oriented to person, place, and time. He appears well-developed. No distress.  NAD  HENT:  Mouth/Throat: Oropharynx is clear and moist.  Eyes: Conjunctivae are normal. Pupils are equal, round, and reactive to light.  Neck: Normal range of motion. No JVD present. No thyromegaly present.  Cardiovascular: Normal rate, regular rhythm, normal heart sounds and intact distal pulses.  Exam reveals no gallop and no friction rub.   No murmur heard. Pulmonary/Chest: Effort normal and breath sounds normal. No respiratory distress. He has no wheezes. He has no rales. He exhibits  no tenderness.  Abdominal: Soft. Bowel sounds are normal. He exhibits no distension and no mass. There is no tenderness. There is no rebound and no guarding.  Musculoskeletal: Normal range of motion. He exhibits tenderness. He exhibits no edema.  Lymphadenopathy:    He has no cervical adenopathy.  Neurological: He is alert and oriented to person, place, and time. He has normal reflexes. No cranial nerve deficit. He exhibits normal muscle tone. He displays  a negative Romberg sign. Coordination and gait normal.  Skin: Skin is warm and dry. No rash noted.  Psychiatric: He has a normal mood and affect. His behavior is normal. Judgment and thought content normal.  hips NT - OK ROM Str leg elev (-) B  Lab Results  Component Value Date   WBC 6.8 11/19/2015   HGB 11.7* 11/19/2015   HCT 36.2* 11/19/2015   PLT 232.0 11/19/2015   GLUCOSE 108* 11/19/2015   CHOL 162 11/07/2013   TRIG 122.0 11/07/2013   HDL 57.30 11/07/2013   LDLDIRECT 157.5 07/05/2008   LDLCALC 80 11/07/2013   ALT 14 08/13/2015   AST 17 08/13/2015   NA 139 11/19/2015   K 4.3 11/19/2015   CL 103 11/19/2015   CREATININE 0.79 11/19/2015   BUN 20 11/19/2015   CO2 30 11/19/2015   TSH 0.831 08/13/2015   PSA 0.49 03/01/2012   INR 1.06 08/13/2015    No results [name].  Assessment & Plan:   Diagnoses and all orders for this visit:  Low back pain with sciatica, sciatica laterality unspecified, unspecified back pain laterality, unspecified chronicity -     Ambulatory referral to Neurosurgery  Claudication [facility]) -     Ambulatory referral to Neurosurgery -     CBC with Differential/Platelet; Future -     Basic metabolic panel; Future  B12 deficiency -     CBC with Differential/Platelet; Future -     Basic metabolic panel; Future  Atherosclerosis of native coronary artery of native heart with angina pectoris ([name])  Other orders -     ranitidine (ZANTAC) 150 MG tablet; Take 1 tablet (150 mg total) by mouth at bedtime. -     traMADol (ULTRAM) 50 MG tablet; Take 1-2 tablets (50-100 mg total) by mouth 2 (two) times daily as needed for severe pain.   I have discontinued [name]'s Pitavastatin Calcium. I am also having him start on ranitidine and traMADol. Additionally, I am having him maintain his Vitamin 0000000, aspirin, folic acid, DULoxetine, finasteride, furosemide, tamsulosin, nitroGLYCERIN, and simvastatin.  Meds ordered this encounter  Medications  . ranitidine  (ZANTAC) 150 MG tablet    Sig: Take 1 tablet (150 mg total) by mouth at bedtime.    Dispense:  90 tablet    Refill:  3  . traMADol (ULTRAM) 50 MG tablet    Sig: Take 1-2 tablets (50-100 mg total) by mouth 2 (two) times daily as needed for severe pain.    Dispense:  120 tablet    Refill:  3     Follow-up: Return in about 6 weeks (around 12/31/2015) for a follow-up visit.  [name], MD

## 2015-11-19 NOTE — Assessment & Plan Note (Signed)
Worse 3/17 Discussed spinal stenosis sx's/rods -  f/u w/Dr Vertell Limber Tramadol prn  Potential benefits of a long term opioids use as well as potential risks (i.e. addiction risk, apnea etc) and complications (i.e. Somnolence, constipation and others) were explained to the patient and were aknowledged.

## 2015-11-19 NOTE — Assessment & Plan Note (Signed)
Doing well 

## 2015-11-19 NOTE — Patient Instructions (Signed)
Spinal Stenosis Spinal stenosis is an abnormal narrowing of the canals of your spine (vertebrae). CAUSES  Spinal stenosis is caused by areas of bone pushing into the central canals of your vertebrae. This condition can be present at birth (congenital). It also may be caused by arthritic deterioration of your vertebrae (spinal degeneration).  SYMPTOMS   Pain that is generally worse with activities, particularly standing and walking.  Numbness, tingling, hot or cold sensations, weakness, or weariness in your legs.  Frequent episodes of falling.  A foot-slapping gait that leads to muscle weakness. DIAGNOSIS  Spinal stenosis is diagnosed with the use of magnetic resonance imaging (MRI) or computed tomography (CT). TREATMENT  Initial therapy for spinal stenosis focuses on the management of the pain and other symptoms associated with the condition. These therapies include:  Practicing postural changes to lessen pressure on your nerves.  Exercises to strengthen the core of your body.  Loss of excess body weight.  The use of nonsteroidal anti-inflammatory medicines to reduce swelling and inflammation in your nerves. When therapies to manage pain are not successful, surgery to treat spinal stenosis may be recommended. This surgery involves removing excess bone, which puts pressure on your nerve roots. During this surgery (laminectomy), the posterior boney arch (lamina) and excess bone around the facet joints are removed.   This information is not intended to replace advice given to you by your health care provider. Make sure you discuss any questions you have with your health care provider.   Document Released: 11/01/2003 Document Revised: 09/01/2014 Document Reviewed: 11/19/2012 Elsevier Interactive Patient Education 2016 [employer]  

## 2015-11-19 NOTE — Assessment & Plan Note (Addendum)
Worse 3/17 Discussed spinal stenosis sx's/rods -  f/u w/Dr Vertell Limber Tramadol prn  Potential benefits of a long term opioids use as well as potential risks (i.e. addiction risk, apnea etc) and complications (i.e. Somnolence, constipation and others) were explained to the patient and were aknowledged.

## 2015-11-19 NOTE — Assessment & Plan Note (Signed)
On B12 

## 2015-11-25 ENCOUNTER — Encounter: Payer: Self-pay | Admitting: Internal Medicine

## 2015-12-10 ENCOUNTER — Other Ambulatory Visit: Payer: Self-pay | Admitting: Internal Medicine

## 2016-01-08 ENCOUNTER — Ambulatory Visit: Payer: Medicare Other | Admitting: Internal Medicine

## 2016-01-09 ENCOUNTER — Ambulatory Visit: Payer: Medicare Other | Admitting: Internal Medicine

## 2016-01-09 DIAGNOSIS — Z961 Presence of intraocular lens: Secondary | ICD-10-CM | POA: Diagnosis not present

## 2016-01-09 DIAGNOSIS — H04123 Dry eye syndrome of bilateral lacrimal glands: Secondary | ICD-10-CM | POA: Diagnosis not present

## 2016-01-09 DIAGNOSIS — H26491 Other secondary cataract, right eye: Secondary | ICD-10-CM | POA: Diagnosis not present

## 2016-01-16 DIAGNOSIS — M25551 Pain in right hip: Secondary | ICD-10-CM | POA: Diagnosis not present

## 2016-01-16 DIAGNOSIS — M5412 Radiculopathy, cervical region: Secondary | ICD-10-CM | POA: Diagnosis not present

## 2016-01-16 DIAGNOSIS — M5416 Radiculopathy, lumbar region: Secondary | ICD-10-CM | POA: Diagnosis not present

## 2016-01-16 DIAGNOSIS — M25552 Pain in left hip: Secondary | ICD-10-CM | POA: Diagnosis not present

## 2016-01-18 ENCOUNTER — Encounter: Payer: Self-pay | Admitting: Internal Medicine

## 2016-01-18 ENCOUNTER — Ambulatory Visit (INDEPENDENT_AMBULATORY_CARE_PROVIDER_SITE_OTHER): Payer: Medicare Other | Admitting: Internal Medicine

## 2016-01-18 VITALS — BP 104/60 | HR 68 | Wt 236.0 lb

## 2016-01-18 DIAGNOSIS — R0600 Dyspnea, unspecified: Secondary | ICD-10-CM

## 2016-01-18 DIAGNOSIS — I10 Essential (primary) hypertension: Secondary | ICD-10-CM | POA: Diagnosis not present

## 2016-01-18 DIAGNOSIS — I25119 Atherosclerotic heart disease of native coronary artery with unspecified angina pectoris: Secondary | ICD-10-CM

## 2016-01-18 DIAGNOSIS — D509 Iron deficiency anemia, unspecified: Secondary | ICD-10-CM | POA: Diagnosis not present

## 2016-01-18 DIAGNOSIS — R29898 Other symptoms and signs involving the musculoskeletal system: Secondary | ICD-10-CM

## 2016-01-18 DIAGNOSIS — E538 Deficiency of other specified B group vitamins: Secondary | ICD-10-CM | POA: Diagnosis not present

## 2016-01-18 MED ORDER — VITAMIN C 500 MG PO TABS: 500.0000 mg | ORAL_TABLET | Freq: Every day | ORAL | Status: DC

## 2016-01-18 MED ORDER — FERROUS GLUCONATE 324 (38 FE) MG PO TABS: 324.0000 mg | ORAL_TABLET | Freq: Every day | ORAL | Status: DC

## 2016-01-18 NOTE — Assessment & Plan Note (Signed)
ASA, Simvastatin 

## 2016-01-18 NOTE — Assessment & Plan Note (Signed)
Seeing Dr Vertell Limber

## 2016-01-18 NOTE — Assessment & Plan Note (Signed)
CBC, iron 

## 2016-01-18 NOTE — Progress Notes (Signed)
Subjective:  Patient ID: [name], male    DOB: July 19, 1941  Age: 75 y.o. MRN: [id number]  CC: No chief complaint on file.   HPI [name] presents for anemia, LBP, neck pain, depression f/u  Outpatient Prescriptions Prior to Visit  Medication Sig Dispense Refill  . aspirin 81 MG chewable tablet Chew 81 mg by mouth at bedtime.    . Cyanocobalamin (VITAMIN B-12) 1000 MCG SUBL Place 1 tablet (1,000 mcg total) under the tongue daily. 100 tablet 3  . DULoxetine (CYMBALTA) 60 MG capsule Take 1 capsule (60 mg total) by mouth daily. 90 capsule 3  . finasteride (PROSCAR) 5 MG tablet TAKE 1 TABLET (5 MG TOTAL) BY MOUTH DAILY. 90 tablet 3  . folic acid (V-R FOLIC ACID) A999333 MCG tablet Take 1 tablet (400 mcg total) by mouth daily. 90 tablet 3  . furosemide (LASIX) 20 MG tablet Take 1-2 tablets (20-40 mg total) by mouth daily as needed for edema. 180 tablet 3  . nitroGLYCERIN (NITROSTAT) 0.4 MG SL tablet Place 1 tablet (0.4 mg total) under the tongue every 5 (five) minutes as needed for chest pain. 20 tablet 3  . ranitidine (ZANTAC) 150 MG tablet Take 1 tablet (150 mg total) by mouth at bedtime. 90 tablet 3  . simvastatin (ZOCOR) 40 MG tablet TAKE 1 TABLET (40 MG TOTAL) BY MOUTH AT BEDTIME.  3  . tamsulosin (FLOMAX) 0.4 MG CAPS capsule Take 1 capsule (0.4 mg total) by mouth daily. 90 capsule 3  . traMADol (ULTRAM) 50 MG tablet Take 1-2 tablets (50-100 mg total) by mouth 2 (two) times daily as needed for severe pain. 120 tablet 3  . tamsulosin (FLOMAX) 0.4 MG CAPS capsule TAKE ONE CAPSULE BY MOUTH EVERY DAY 90 capsule 1   No facility-administered medications prior to visit.    ROS Review of Systems  Constitutional: Negative for appetite change, fatigue and unexpected weight change.  HENT: Negative for congestion, nosebleeds, sneezing, sore throat and trouble swallowing.   Eyes: Negative for itching and visual disturbance.  Respiratory: Negative for cough.   Cardiovascular:  Negative for chest pain, palpitations and leg swelling.  Gastrointestinal: Negative for nausea, vomiting, diarrhea, blood in stool and abdominal distention.  Genitourinary: Negative for frequency and hematuria.  Musculoskeletal: Positive for back pain, arthralgias and neck pain. Negative for joint swelling and gait problem.  Skin: Negative for pallor and rash.  Neurological: Negative for dizziness, tremors, speech difficulty and weakness.  Psychiatric/Behavioral: Negative for sleep disturbance, dysphoric mood and agitation. The patient is not nervous/anxious.     Objective:  BP 104/60 mmHg  Pulse 68  Wt 236 lb (107.049 kg)  SpO2 96%  BP Readings from Last 3 Encounters:  01/18/16 104/60  11/19/15 102/66  10/15/15 118/80    Wt Readings from Last 3 Encounters:  01/18/16 236 lb (107.049 kg)  11/19/15 234 lb (106.142 kg)  10/15/15 238 lb 3 oz (108.041 kg)    Physical Exam  Constitutional: He is oriented to person, place, and time. He appears well-developed. No distress.  NAD  HENT:  Mouth/Throat: Oropharynx is clear and moist.  Eyes: Conjunctivae are normal. Pupils are equal, round, and reactive to light.  Neck: Normal range of motion. No JVD present. No thyromegaly present.  Cardiovascular: Normal rate, regular rhythm, normal heart sounds and intact distal pulses.  Exam reveals no gallop and no friction rub.   No murmur heard. Pulmonary/Chest: Effort normal and breath sounds normal. No respiratory distress. He has no  wheezes. He has no rales. He exhibits no tenderness.  Abdominal: Soft. Bowel sounds are normal. He exhibits no distension and no mass. There is no tenderness. There is no rebound and no guarding.  Musculoskeletal: Normal range of motion. He exhibits tenderness. He exhibits no edema.  Lymphadenopathy:    He has no cervical adenopathy.  Neurological: He is alert and oriented to person, place, and time. He has normal reflexes. No cranial nerve deficit. He exhibits  normal muscle tone. He displays a negative Romberg sign. Coordination and gait normal.  Skin: Skin is warm and dry. No rash noted.  Psychiatric: He has a normal mood and affect. His behavior is normal. Judgment and thought content normal.    Lab Results  Component Value Date   WBC 6.8 11/19/2015   HGB 11.7* 11/19/2015   HCT 36.2* 11/19/2015   PLT 232.0 11/19/2015   GLUCOSE 108* 11/19/2015   CHOL 162 11/07/2013   TRIG 122.0 11/07/2013   HDL 57.30 11/07/2013   LDLDIRECT 157.5 07/05/2008   LDLCALC 80 11/07/2013   ALT 14 08/13/2015   AST 17 08/13/2015   NA 139 11/19/2015   K 4.3 11/19/2015   CL 103 11/19/2015   CREATININE 0.79 11/19/2015   BUN 20 11/19/2015   CO2 30 11/19/2015   TSH 0.831 08/13/2015   PSA 0.49 03/01/2012   INR 1.06 08/13/2015    No results found.  Assessment & Plan:   There are no diagnoses linked to this encounter. I am having Mr. Norwood maintain his Vitamin 0000000, aspirin, folic acid, DULoxetine, finasteride, furosemide, tamsulosin, nitroGLYCERIN, simvastatin, ranitidine, and traMADol.  No orders of the defined types were placed in this encounter.     Follow-up: No Follow-up on file.  [name], MD

## 2016-01-18 NOTE — Assessment & Plan Note (Signed)
  On diet  

## 2016-01-18 NOTE — Progress Notes (Signed)
Pre visit review using our clinic review tool, if applicable. No additional management support is needed unless otherwise documented below in the visit note. 

## 2016-01-18 NOTE — Assessment & Plan Note (Signed)
On vit B12  ?

## 2016-01-18 NOTE — Assessment & Plan Note (Signed)
Resolved

## 2016-01-18 NOTE — Assessment & Plan Note (Signed)
Labs

## 2016-02-18 ENCOUNTER — Other Ambulatory Visit: Payer: Self-pay | Admitting: *Deleted

## 2016-02-18 MED ORDER — SIMVASTATIN 40 MG PO TABS: ORAL_TABLET | ORAL | Status: DC

## 2016-02-18 MED ORDER — DULOXETINE HCL 60 MG PO CPEP: 60.0000 mg | ORAL_CAPSULE | Freq: Every day | ORAL | Status: DC

## 2016-02-18 MED ORDER — FUROSEMIDE 20 MG PO TABS: 20.0000 mg | ORAL_TABLET | Freq: Every day | ORAL | Status: DC | PRN

## 2016-02-18 MED ORDER — TAMSULOSIN HCL 0.4 MG PO CAPS: 0.4000 mg | ORAL_CAPSULE | Freq: Every day | ORAL | Status: DC

## 2016-02-21 ENCOUNTER — Telehealth: Payer: Self-pay | Admitting: *Deleted

## 2016-02-21 MED ORDER — FINASTERIDE 5 MG PO TABS: ORAL_TABLET | ORAL | Status: DC

## 2016-02-21 MED ORDER — RANITIDINE HCL 150 MG PO TABS: 150.0000 mg | ORAL_TABLET | Freq: Every day | ORAL | Status: DC

## 2016-02-21 NOTE — Telephone Encounter (Signed)
OK to fill this 90 days prescription with additional refills x0 Thank you!

## 2016-02-21 NOTE — Telephone Encounter (Signed)
Rec'd fax pt requesting 90 day supply on his Tramadol...[name]

## 2016-02-22 ENCOUNTER — Other Ambulatory Visit (INDEPENDENT_AMBULATORY_CARE_PROVIDER_SITE_OTHER): Payer: Medicare Other

## 2016-02-22 DIAGNOSIS — M19012 Primary osteoarthritis, left shoulder: Secondary | ICD-10-CM | POA: Diagnosis not present

## 2016-02-22 DIAGNOSIS — D509 Iron deficiency anemia, unspecified: Secondary | ICD-10-CM | POA: Diagnosis not present

## 2016-02-22 DIAGNOSIS — M25512 Pain in left shoulder: Secondary | ICD-10-CM | POA: Diagnosis not present

## 2016-02-22 DIAGNOSIS — R0600 Dyspnea, unspecified: Secondary | ICD-10-CM

## 2016-02-22 DIAGNOSIS — I10 Essential (primary) hypertension: Secondary | ICD-10-CM | POA: Diagnosis not present

## 2016-02-22 DIAGNOSIS — M19022 Primary osteoarthritis, left elbow: Secondary | ICD-10-CM | POA: Diagnosis not present

## 2016-02-22 DIAGNOSIS — I25119 Atherosclerotic heart disease of native coronary artery with unspecified angina pectoris: Secondary | ICD-10-CM | POA: Diagnosis not present

## 2016-02-22 DIAGNOSIS — E538 Deficiency of other specified B group vitamins: Secondary | ICD-10-CM | POA: Diagnosis not present

## 2016-02-22 DIAGNOSIS — R29898 Other symptoms and signs involving the musculoskeletal system: Secondary | ICD-10-CM

## 2016-02-22 DIAGNOSIS — M25522 Pain in left elbow: Secondary | ICD-10-CM | POA: Diagnosis not present

## 2016-02-22 DIAGNOSIS — M75102 Unspecified rotator cuff tear or rupture of left shoulder, not specified as traumatic: Secondary | ICD-10-CM | POA: Diagnosis not present

## 2016-02-22 LAB — CBC WITH DIFFERENTIAL/PLATELET
Basophils Absolute: 0.1 10*3/uL (ref 0.0–0.1)
Basophils Relative: 1.1 % (ref 0.0–3.0)
Eosinophils Absolute: 0.2 10*3/uL (ref 0.0–0.7)
Eosinophils Relative: 3.4 % (ref 0.0–5.0)
HCT: 29.7 % — ABNORMAL LOW (ref 39.0–52.0)
Hemoglobin: 9.3 g/dL — ABNORMAL LOW (ref 13.0–17.0)
Lymphocytes Relative: 33.8 % (ref 12.0–46.0)
Lymphs Abs: 1.5 10*3/uL (ref 0.7–4.0)
MCHC: 31.4 g/dL (ref 30.0–36.0)
MCV: 73.8 fl — ABNORMAL LOW (ref 78.0–100.0)
Monocytes Absolute: 0.8 10*3/uL (ref 0.1–1.0)
Monocytes Relative: 17 % — ABNORMAL HIGH (ref 3.0–12.0)
Neutro Abs: 2 10*3/uL (ref 1.4–7.7)
Neutrophils Relative %: 44.7 % (ref 43.0–77.0)
Platelets: 229 10*3/uL (ref 150.0–400.0)
RBC: 4.02 Mil/uL — ABNORMAL LOW (ref 4.22–5.81)
RDW: 17.1 % — ABNORMAL HIGH (ref 11.5–15.5)
WBC: 4.5 10*3/uL (ref 4.0–10.5)

## 2016-02-22 LAB — BASIC METABOLIC PANEL
BUN: 21 mg/dL (ref 6–23)
CO2: 30 mEq/L (ref 19–32)
Calcium: 9.5 mg/dL (ref 8.4–10.5)
Chloride: 104 mEq/L (ref 96–112)
Creatinine, Ser: 0.87 mg/dL (ref 0.40–1.50)
GFR: 90.94 mL/min (ref >60.00)
Glucose, Bld: 97 mg/dL (ref 70–99)
Potassium: 4.1 mEq/L (ref 3.5–5.1)
Sodium: 140 mEq/L (ref 135–145)

## 2016-02-22 LAB — LIPID PANEL
Cholesterol: 139 mg/dL (ref 0–200)
HDL: 51.7 mg/dL (ref >39.00)
LDL Cholesterol: 73 mg/dL (ref 0–99)
NonHDL: 87.34
Total CHOL/HDL Ratio: 3
Triglycerides: 74 mg/dL (ref 0.0–149.0)
VLDL: 14.8 mg/dL (ref 0.0–40.0)

## 2016-02-22 LAB — IBC PANEL
Iron: 15 ug/dL — ABNORMAL LOW (ref 42–165)
Saturation Ratios: 3.2 % — ABNORMAL LOW (ref 20.0–50.0)
Transferrin: 338 mg/dL (ref 212.0–360.0)

## 2016-02-22 MED ORDER — TRAMADOL HCL 50 MG PO TABS: 50.0000 mg | ORAL_TABLET | Freq: Two times a day (BID) | ORAL | Status: DC | PRN

## 2016-02-22 NOTE — Telephone Encounter (Signed)
Faxed script back to [facility]...Johny Chess

## 2016-03-05 ENCOUNTER — Other Ambulatory Visit (INDEPENDENT_AMBULATORY_CARE_PROVIDER_SITE_OTHER): Payer: Medicare Other

## 2016-03-05 DIAGNOSIS — I10 Essential (primary) hypertension: Secondary | ICD-10-CM

## 2016-03-05 DIAGNOSIS — D509 Iron deficiency anemia, unspecified: Secondary | ICD-10-CM

## 2016-03-05 LAB — CBC WITH DIFFERENTIAL/PLATELET
Basophils Absolute: 0 10*3/uL (ref 0.0–0.1)
Basophils Relative: 0.3 % (ref 0.0–3.0)
Eosinophils Absolute: 0.2 10*3/uL (ref 0.0–0.7)
Eosinophils Relative: 2.2 % (ref 0.0–5.0)
HCT: 33.3 % — ABNORMAL LOW (ref 39.0–52.0)
Hemoglobin: 10.6 g/dL — ABNORMAL LOW (ref 13.0–17.0)
Lymphocytes Relative: 21.1 % (ref 12.0–46.0)
Lymphs Abs: 1.6 10*3/uL (ref 0.7–4.0)
MCHC: 31.7 g/dL (ref 30.0–36.0)
MCV: 75.4 fl — ABNORMAL LOW (ref 78.0–100.0)
Monocytes Absolute: 1.2 10*3/uL — ABNORMAL HIGH (ref 0.1–1.0)
Monocytes Relative: 15.6 % — ABNORMAL HIGH (ref 3.0–12.0)
Neutro Abs: 4.8 10*3/uL (ref 1.4–7.7)
Neutrophils Relative %: 60.8 % (ref 43.0–77.0)
Platelets: 272 10*3/uL (ref 150.0–400.0)
RBC: 4.41 Mil/uL (ref 4.22–5.81)
RDW: 19.3 % — ABNORMAL HIGH (ref 11.5–15.5)
WBC: 7.8 10*3/uL (ref 4.0–10.5)

## 2016-03-05 LAB — BASIC METABOLIC PANEL
BUN: 18 mg/dL (ref 6–23)
CO2: 30 mEq/L (ref 19–32)
Calcium: 9.8 mg/dL (ref 8.4–10.5)
Chloride: 103 mEq/L (ref 96–112)
Creatinine, Ser: 0.87 mg/dL (ref 0.40–1.50)
GFR: 90.93 mL/min (ref >60.00)
Glucose, Bld: 96 mg/dL (ref 70–99)
Potassium: 4.6 mEq/L (ref 3.5–5.1)
Sodium: 140 mEq/L (ref 135–145)

## 2016-03-05 LAB — IBC PANEL
Iron: 60 ug/dL (ref 42–165)
Saturation Ratios: 12.9 % — ABNORMAL LOW (ref 20.0–50.0)
Transferrin: 333 mg/dL (ref 212.0–360.0)

## 2016-03-06 ENCOUNTER — Other Ambulatory Visit: Payer: Self-pay | Admitting: Internal Medicine

## 2016-03-21 DIAGNOSIS — M19012 Primary osteoarthritis, left shoulder: Secondary | ICD-10-CM | POA: Diagnosis not present

## 2016-03-21 DIAGNOSIS — M75102 Unspecified rotator cuff tear or rupture of left shoulder, not specified as traumatic: Secondary | ICD-10-CM | POA: Diagnosis not present

## 2016-03-21 DIAGNOSIS — G8929 Other chronic pain: Secondary | ICD-10-CM | POA: Diagnosis not present

## 2016-03-21 DIAGNOSIS — M542 Cervicalgia: Secondary | ICD-10-CM | POA: Diagnosis not present

## 2016-03-21 DIAGNOSIS — M25512 Pain in left shoulder: Secondary | ICD-10-CM | POA: Diagnosis not present

## 2016-03-31 DIAGNOSIS — M19012 Primary osteoarthritis, left shoulder: Secondary | ICD-10-CM | POA: Diagnosis not present

## 2016-04-04 DIAGNOSIS — S46212A Strain of muscle, fascia and tendon of other parts of biceps, left arm, initial encounter: Secondary | ICD-10-CM | POA: Diagnosis not present

## 2016-04-04 DIAGNOSIS — M19012 Primary osteoarthritis, left shoulder: Secondary | ICD-10-CM | POA: Diagnosis not present

## 2016-04-04 DIAGNOSIS — S46012A Strain of muscle(s) and tendon(s) of the rotator cuff of left shoulder, initial encounter: Secondary | ICD-10-CM | POA: Diagnosis not present

## 2016-04-11 ENCOUNTER — Other Ambulatory Visit (INDEPENDENT_AMBULATORY_CARE_PROVIDER_SITE_OTHER): Payer: Medicare Other

## 2016-04-11 DIAGNOSIS — I10 Essential (primary) hypertension: Secondary | ICD-10-CM | POA: Diagnosis not present

## 2016-04-11 DIAGNOSIS — D509 Iron deficiency anemia, unspecified: Secondary | ICD-10-CM

## 2016-04-11 LAB — CBC WITH DIFFERENTIAL/PLATELET
Basophils Absolute: 0 10*3/uL (ref 0.0–0.1)
Basophils Relative: 0.5 % (ref 0.0–3.0)
Eosinophils Absolute: 0.2 10*3/uL (ref 0.0–0.7)
Eosinophils Relative: 3.4 % (ref 0.0–5.0)
HCT: 38.3 % — ABNORMAL LOW (ref 39.0–52.0)
Hemoglobin: 12.4 g/dL — ABNORMAL LOW (ref 13.0–17.0)
Lymphocytes Relative: 32.3 % (ref 12.0–46.0)
Lymphs Abs: 1.7 10*3/uL (ref 0.7–4.0)
MCHC: 32.4 g/dL (ref 30.0–36.0)
MCV: 83.3 fl (ref 78.0–100.0)
Monocytes Absolute: 0.8 10*3/uL (ref 0.1–1.0)
Monocytes Relative: 16.4 % — ABNORMAL HIGH (ref 3.0–12.0)
Neutro Abs: 2.4 10*3/uL (ref 1.4–7.7)
Neutrophils Relative %: 47.4 % (ref 43.0–77.0)
Platelets: 193 10*3/uL (ref 150.0–400.0)
RBC: 4.6 Mil/uL (ref 4.22–5.81)
RDW: 27.4 % — ABNORMAL HIGH (ref 11.5–15.5)
WBC: 5.1 10*3/uL (ref 4.0–10.5)

## 2016-04-11 LAB — BASIC METABOLIC PANEL
BUN: 14 mg/dL (ref 6–23)
CO2: 28 mEq/L (ref 19–32)
Calcium: 9.5 mg/dL (ref 8.4–10.5)
Chloride: 104 mEq/L (ref 96–112)
Creatinine, Ser: 0.8 mg/dL (ref 0.40–1.50)
GFR: 100.14 mL/min (ref >60.00)
Glucose, Bld: 86 mg/dL (ref 70–99)
Potassium: 3.8 mEq/L (ref 3.5–5.1)
Sodium: 141 mEq/L (ref 135–145)

## 2016-04-11 LAB — IBC PANEL
Iron: 181 ug/dL — ABNORMAL HIGH (ref 42–165)
Saturation Ratios: 49 % (ref 20.0–50.0)
Transferrin: 264 mg/dL (ref 212.0–360.0)

## 2016-04-16 ENCOUNTER — Ambulatory Visit (INDEPENDENT_AMBULATORY_CARE_PROVIDER_SITE_OTHER): Payer: Medicare Other | Admitting: Internal Medicine

## 2016-04-16 ENCOUNTER — Encounter: Payer: Self-pay | Admitting: Internal Medicine

## 2016-04-16 DIAGNOSIS — Z01818 Encounter for other preprocedural examination: Secondary | ICD-10-CM | POA: Diagnosis not present

## 2016-04-16 DIAGNOSIS — I10 Essential (primary) hypertension: Secondary | ICD-10-CM

## 2016-04-16 DIAGNOSIS — M5440 Lumbago with sciatica, unspecified side: Secondary | ICD-10-CM | POA: Diagnosis not present

## 2016-04-16 DIAGNOSIS — I739 Peripheral vascular disease, unspecified: Secondary | ICD-10-CM | POA: Diagnosis not present

## 2016-04-16 DIAGNOSIS — Z23 Encounter for immunization: Secondary | ICD-10-CM | POA: Diagnosis not present

## 2016-04-16 DIAGNOSIS — I2510 Atherosclerotic heart disease of native coronary artery without angina pectoris: Secondary | ICD-10-CM | POA: Diagnosis not present

## 2016-04-16 DIAGNOSIS — E538 Deficiency of other specified B group vitamins: Secondary | ICD-10-CM

## 2016-04-16 NOTE — Progress Notes (Signed)
Subjective:  Patient ID: [name], male    DOB: [birth year]  Age: 75 y.o. MRN: [id number]  CC: No chief complaint on file.   HPI  IM consult Reason: pre-op med clearance Req by Dr Veverly Fells Hx: [name] presents for OA, LBP, depression, anemia - all stable C/o L shoulder pain - Dr Veverly Fells suggested a shoulder replacement  Past Medical History:  Diagnosis Date  . Blood transfusion without reported diagnosis 02/23/14   3 units for iron deficiency anemia  . BPH (benign prostatic hyperplasia)   . CTS (carpal tunnel syndrome)    better  . Hyperlipidemia   . Insomnia   . LBP (low back pain)   . Osteoarthritis   . Sleep apnea    no cpap   Past Surgical History:  Procedure Laterality Date  . CARDIAC CATHETERIZATION N/A 08/15/2015   Procedure: Left Heart Cath and Coronary Angiography;  Surgeon: Lorretta Harp, MD;  Location: [facility];  Service: Cardiovascular;  Laterality: N/A;  . CARPAL TUNNEL RELEASE Bilateral 1996, 2004   Dr Lenoard Aden thumb surgery.   . COLONOSCOPY  2005, 2012   diverticulosis.   Marland Kitchen ESOPHAGOGASTRODUODENOSCOPY N/A 02/23/2014   Procedure: ESOPHAGOGASTRODUODENOSCOPY (EGD);  Surgeon: Irene Shipper, MD;  Location: [facility] ENDOSCOPY;  Service: Endoscopy;  Laterality: N/A;  . EYE SURGERY  2012   both cataracts, Lasik  . LUMBAR FUSION     x 3  . TONSILLECTOMY  1946  . TOTAL KNEE ARTHROPLASTY  2003   Right    reports that he quit smoking about 29 years ago. He has never used smokeless tobacco. He reports that he drinks alcohol. He reports that he does not use drugs. family history includes Alzheimer's disease in his brother and father; Aneurysm in his father; Autoimmune disease in his son; CAD in his brother; Cancer in his brother; Cancer (age of onset: 69) in his mother; Heart disease (age of onset: 42) in his mother; Heart disease (age of onset: 25) in his father; Stomach cancer in his brother. No Known Allergies   Outpatient Medications Prior  to Visit  Medication Sig Dispense Refill  . aspirin 81 MG chewable tablet Chew 81 mg by mouth at bedtime.    . Cyanocobalamin (VITAMIN B-12) 1000 MCG SUBL Place 1 tablet (1,000 mcg total) under the tongue daily. 100 tablet 3  . DULoxetine (CYMBALTA) 60 MG capsule TAKE 1 CAPSULE (60 MG TOTAL) BY MOUTH DAILY. 90 capsule 0  . ferrous gluconate (FERGON) 324 MG tablet Take 1 tablet (324 mg total) by mouth daily with breakfast. 100 tablet 1  . finasteride (PROSCAR) 5 MG tablet TAKE 1 TABLET (5 MG TOTAL) BY MOUTH DAILY. 90 tablet 3  . folic acid (V-R FOLIC ACID) A999333 MCG tablet Take 1 tablet (400 mcg total) by mouth daily. 90 tablet 3  . furosemide (LASIX) 20 MG tablet Take 1-2 tablets (20-40 mg total) by mouth daily as needed for edema. 180 tablet 2  . nitroGLYCERIN (NITROSTAT) 0.4 MG SL tablet Place 1 tablet (0.4 mg total) under the tongue every 5 (five) minutes as needed for chest pain. 20 tablet 3  . ranitidine (ZANTAC) 150 MG tablet Take 1 tablet (150 mg total) by mouth at bedtime. 90 tablet 3  . simvastatin (ZOCOR) 40 MG tablet TAKE 1 TABLET (40 MG TOTAL) BY MOUTH AT BEDTIME. 90 tablet 2  . tamsulosin (FLOMAX) 0.4 MG CAPS capsule Take 1 capsule (0.4 mg total) by mouth daily. 90 capsule 2  .  traMADol (ULTRAM) 50 MG tablet Take 1-2 tablets (50-100 mg total) by mouth 2 (two) times daily as needed for severe pain. 120 tablet 0  . vitamin C (ASCORBIC ACID) 500 MG tablet Take 1 tablet (500 mg total) by mouth daily. 100 tablet 1  . DULoxetine (CYMBALTA) 60 MG capsule Take 1 capsule (60 mg total) by mouth daily. 90 capsule 2   No facility-administered medications prior to visit.     ROS Review of Systems  Constitutional: Positive for fatigue. Negative for appetite change and unexpected weight change.  HENT: Negative for congestion, nosebleeds, sneezing, sore throat and trouble swallowing.   Eyes: Negative for itching and visual disturbance.  Respiratory: Negative for cough.   Cardiovascular: Negative  for chest pain, palpitations and leg swelling.  Gastrointestinal: Negative for abdominal distention, blood in stool, diarrhea and nausea.  Genitourinary: Negative for frequency and hematuria.  Musculoskeletal: Positive for arthralgias and back pain. Negative for gait problem, joint swelling and neck pain.  Skin: Negative for rash.  Neurological: Negative for dizziness, tremors, speech difficulty and weakness.  Psychiatric/Behavioral: Negative for agitation, dysphoric mood and sleep disturbance. The patient is not nervous/anxious.     Objective:  BP (!) 92/58   Pulse 73   Temp 98.5 F (36.9 C) (Oral)   Wt 232 lb (105.2 kg)   SpO2 95%   BMI 31.46 kg/m   BP Readings from Last 3 Encounters:  04/16/16 (!) 92/58  01/18/16 104/60  11/19/15 102/66    Wt Readings from Last 3 Encounters:  04/16/16 232 lb (105.2 kg)  01/18/16 236 lb (107 kg)  11/19/15 234 lb (106.1 kg)    Physical Exam  Constitutional: He is oriented to person, place, and time. He appears well-developed. No distress.  NAD  HENT:  Mouth/Throat: Oropharynx is clear and moist.  Eyes: Conjunctivae are normal. Pupils are equal, round, and reactive to light.  Neck: Normal range of motion. No JVD present. No thyromegaly present.  Cardiovascular: Normal rate, regular rhythm, normal heart sounds and intact distal pulses.  Exam reveals no gallop and no friction rub.   No murmur heard. Pulmonary/Chest: Effort normal and breath sounds normal. No respiratory distress. He has no wheezes. He has no rales. He exhibits no tenderness.  Abdominal: Soft. Bowel sounds are normal. He exhibits no distension and no mass. There is no tenderness. There is no rebound and no guarding.  Musculoskeletal: Normal range of motion. He exhibits no edema or tenderness.  Lymphadenopathy:    He has no cervical adenopathy.  Neurological: He is alert and oriented to person, place, and time. He has normal reflexes. No cranial nerve deficit. He exhibits  normal muscle tone. He displays a negative Romberg sign. Coordination and gait normal.  Skin: Skin is warm and dry. No rash noted.  Psychiatric: He has a normal mood and affect. His behavior is normal. Judgment and thought content normal.     08/15/15 Heart Cath (Dr Gwenlyn Found) - IMPRESSION: [name]  has normal coronary arteries and normal left ventricular function. I believe his chest pain is noncardiac and his stress test also positive. Quay Burow. MD, [facility] 08/15/2015 12:26 PM  Lab Results  Component Value Date   WBC 5.1 04/11/2016   HGB 12.4 (L) 04/11/2016   HCT 38.3 (L) 04/11/2016   PLT 193.0 04/11/2016   GLUCOSE 86 04/11/2016   CHOL 139 02/22/2016   TRIG 74.0 02/22/2016   HDL 51.70 02/22/2016   LDLDIRECT 157.5 07/05/2008   LDLCALC 73 02/22/2016  ALT 14 08/13/2015   AST 17 08/13/2015   NA 141 04/11/2016   K 3.8 04/11/2016   CL 104 04/11/2016   CREATININE 0.80 04/11/2016   BUN 14 04/11/2016   CO2 28 04/11/2016   TSH 0.831 08/13/2015   PSA 0.49 03/01/2012   INR 1.06 08/13/2015    No results found.  Assessment & Plan:   There are no diagnoses linked to this encounter. I am having [name] maintain his Vitamin 0000000, aspirin, folic acid, nitroGLYCERIN, ferrous gluconate, vitamin C, simvastatin, tamsulosin, furosemide, finasteride, ranitidine, traMADol, and DULoxetine.  No orders of the defined types were placed in this encounter.    Follow-up: No Follow-up on file.  [name], MD

## 2016-04-16 NOTE — Assessment & Plan Note (Signed)
Better: hip OA, LBP

## 2016-04-16 NOTE — Assessment & Plan Note (Signed)
The pt should be clear for his shoulder replacement surgery assuming that his pre-op routine check up is satisfactory. He needs to be watched closely for anemia. Thank you!

## 2016-04-16 NOTE — Assessment & Plan Note (Signed)
Low BP today Monitor CBC

## 2016-04-16 NOTE — Assessment & Plan Note (Signed)
Dr Stanford Breed, Dr Gwenlyn Found 08/15/15 Heart Cath (Dr Gwenlyn Found) - IMPRESSION: Mr. Mccumbers  has normal coronary arteries and normal left ventricular function. I believe his chest pain is noncardiac and his stress test also positive. Quay Burow. MD, [facility] 08/15/2015 12:26 PM Chronic. No angina ASA, Simvastatin

## 2016-04-16 NOTE — Assessment & Plan Note (Signed)
On B12 

## 2016-04-16 NOTE — Progress Notes (Signed)
Pre visit review using our clinic review tool, if applicable. No additional management support is needed unless otherwise documented below in the visit note. 

## 2016-04-16 NOTE — Assessment & Plan Note (Signed)
Better Hip OA Spinal stenosis sx's/rods  Tramadol prn  Potential benefits of a long term opioids use as well as potential risks (i.e. addiction risk, apnea etc) and complications (i.e. Somnolence, constipation and others) were explained to the patient and were aknowledged.

## 2016-04-22 DIAGNOSIS — M19012 Primary osteoarthritis, left shoulder: Secondary | ICD-10-CM | POA: Diagnosis not present

## 2016-04-22 DIAGNOSIS — S46212D Strain of muscle, fascia and tendon of other parts of biceps, left arm, subsequent encounter: Secondary | ICD-10-CM | POA: Diagnosis not present

## 2016-04-22 DIAGNOSIS — S46012D Strain of muscle(s) and tendon(s) of the rotator cuff of left shoulder, subsequent encounter: Secondary | ICD-10-CM | POA: Diagnosis not present

## 2016-05-07 ENCOUNTER — Other Ambulatory Visit: Payer: Self-pay | Admitting: Internal Medicine

## 2016-05-09 ENCOUNTER — Other Ambulatory Visit (INDEPENDENT_AMBULATORY_CARE_PROVIDER_SITE_OTHER): Payer: Medicare Other

## 2016-05-09 DIAGNOSIS — D509 Iron deficiency anemia, unspecified: Secondary | ICD-10-CM

## 2016-05-09 DIAGNOSIS — I10 Essential (primary) hypertension: Secondary | ICD-10-CM

## 2016-05-09 LAB — CBC WITH DIFFERENTIAL/PLATELET
Basophils Absolute: 0.1 10*3/uL (ref 0.0–0.1)
Basophils Relative: 0.9 % (ref 0.0–3.0)
Eosinophils Absolute: 0.3 10*3/uL (ref 0.0–0.7)
Eosinophils Relative: 3.3 % (ref 0.0–5.0)
HCT: 39.5 % (ref 39.0–52.0)
Hemoglobin: 13.2 g/dL (ref 13.0–17.0)
Lymphocytes Relative: 20.5 % (ref 12.0–46.0)
Lymphs Abs: 1.8 10*3/uL (ref 0.7–4.0)
MCHC: 33.4 g/dL (ref 30.0–36.0)
MCV: 86.4 fl (ref 78.0–100.0)
Monocytes Absolute: 1.2 10*3/uL — ABNORMAL HIGH (ref 0.1–1.0)
Monocytes Relative: 14 % — ABNORMAL HIGH (ref 3.0–12.0)
Neutro Abs: 5.3 10*3/uL (ref 1.4–7.7)
Neutrophils Relative %: 61.3 % (ref 43.0–77.0)
Platelets: 286 10*3/uL (ref 150.0–400.0)
RBC: 4.58 Mil/uL (ref 4.22–5.81)
RDW: 23.5 % — ABNORMAL HIGH (ref 11.5–15.5)
WBC: 8.7 10*3/uL (ref 4.0–10.5)

## 2016-05-09 LAB — BASIC METABOLIC PANEL
BUN: 16 mg/dL (ref 6–23)
CO2: 27 mEq/L (ref 19–32)
Calcium: 9.7 mg/dL (ref 8.4–10.5)
Chloride: 103 mEq/L (ref 96–112)
Creatinine, Ser: 0.85 mg/dL (ref 0.40–1.50)
GFR: 93.36 mL/min (ref >60.00)
Glucose, Bld: 78 mg/dL (ref 70–99)
Potassium: 3.9 mEq/L (ref 3.5–5.1)
Sodium: 142 mEq/L (ref 135–145)

## 2016-05-09 LAB — IBC PANEL
Iron: 66 ug/dL (ref 42–165)
Saturation Ratios: 16.3 % — ABNORMAL LOW (ref 20.0–50.0)
Transferrin: 290 mg/dL (ref 212.0–360.0)

## 2016-06-10 NOTE — H&P (Signed)
[name] is an 75 y.o. male.    Chief Complaint: left shoulder pain  HPI: Pt is a 75 y.o. male complaining of left shoulder pain for multiple years. Pain had continually increased since the beginning. X-rays in the clinic show end-stage arthritic changes of the left shoulder. Pt has tried various conservative treatments which have failed to alleviate their symptoms, including injections and therapy. Various options are discussed with the patient. Risks, benefits and expectations were discussed with the patient. Patient understand the risks, benefits and expectations and wishes to proceed with surgery.   PCP:  [name], MD  D/C Plans: Home  PMH: Past Medical History:  Diagnosis Date  . Blood transfusion without reported diagnosis 02/23/14   3 units for iron deficiency anemia  . BPH (benign prostatic hyperplasia)   . CTS (carpal tunnel syndrome)    better  . Hyperlipidemia   . Insomnia   . LBP (low back pain)   . Osteoarthritis   . Sleep apnea    no cpap    PSH: Past Surgical History:  Procedure Laterality Date  . CARDIAC CATHETERIZATION N/A 08/15/2015   Procedure: Left Heart Cath and Coronary Angiography;  Surgeon: Lorretta Harp, MD;  Location: [facility];  Service: Cardiovascular;  Laterality: N/A;  . CARPAL TUNNEL RELEASE Bilateral 1996, 2004   Dr Lenoard Aden thumb surgery.   . COLONOSCOPY  2005, 2012   diverticulosis.   Marland Kitchen ESOPHAGOGASTRODUODENOSCOPY N/A 02/23/2014   Procedure: ESOPHAGOGASTRODUODENOSCOPY (EGD);  Surgeon: Irene Shipper, MD;  Location: [facility] ENDOSCOPY;  Service: Endoscopy;  Laterality: N/A;  . EYE SURGERY  2012   both cataracts, Lasik  . LUMBAR FUSION     x 3  . TONSILLECTOMY  1946  . TOTAL KNEE ARTHROPLASTY  2003   Right    Social History:  reports that he quit smoking about 29 years ago. He has never used smokeless tobacco. He reports that he drinks alcohol. He reports that he does not use drugs.  Allergies:  No Known  Allergies  Medications: No current facility-administered medications for this encounter.    Current Outpatient Prescriptions  Medication Sig Dispense Refill  . aspirin 81 MG chewable tablet Chew 81 mg by mouth at bedtime.    . Cyanocobalamin (VITAMIN B-12) 1000 MCG SUBL Place 1 tablet (1,000 mcg total) under the tongue daily. 100 tablet 3  . DULoxetine (CYMBALTA) 60 MG capsule TAKE 1 CAPSULE (60 MG TOTAL) BY MOUTH DAILY. 90 capsule 0  . ferrous gluconate (FERGON) 324 MG tablet Take 1 tablet (324 mg total) by mouth daily with breakfast. 100 tablet 1  . finasteride (PROSCAR) 5 MG tablet TAKE 1 TABLET (5 MG TOTAL) BY MOUTH DAILY. 90 tablet 3  . finasteride (PROSCAR) 5 MG tablet TAKE 1 TABLET (5 MG TOTAL) BY MOUTH DAILY. 90 tablet 1  . folic acid (V-R FOLIC ACID) A999333 MCG tablet Take 1 tablet (400 mcg total) by mouth daily. 90 tablet 3  . furosemide (LASIX) 20 MG tablet Take 1-2 tablets (20-40 mg total) by mouth daily as needed for edema. 180 tablet 2  . nitroGLYCERIN (NITROSTAT) 0.4 MG SL tablet Place 1 tablet (0.4 mg total) under the tongue every 5 (five) minutes as needed for chest pain. 20 tablet 3  . ranitidine (ZANTAC) 150 MG tablet Take 1 tablet (150 mg total) by mouth at bedtime. 90 tablet 3  . simvastatin (ZOCOR) 40 MG tablet TAKE 1 TABLET (40 MG TOTAL) BY MOUTH AT BEDTIME. 90 tablet 2  .  tamsulosin (FLOMAX) 0.4 MG CAPS capsule Take 1 capsule (0.4 mg total) by mouth daily. 90 capsule 2  . traMADol (ULTRAM) 50 MG tablet Take 1-2 tablets (50-100 mg total) by mouth 2 (two) times daily as needed for severe pain. 120 tablet 0  . vitamin C (ASCORBIC ACID) 500 MG tablet Take 1 tablet (500 mg total) by mouth daily. 100 tablet 1    No results found for this or any previous visit (from the past 48 hour(s)). No results found.  ROS: Pain with rom of the left upper extremity  Physical Exam:  Alert and oriented 75 y.o. male in no acute distress Cranial nerves 2-12 intact Cervical spine: full  rom with no tenderness, nv intact distally Chest: active breath sounds bilaterally, no wheeze rhonchi or rales Heart: regular rate and rhythm, no murmur Abd: non tender non distended with active bowel sounds Hip is stable with rom  Left shoulder with limited rom and strength  nv intact distally No rashes or edema  Assessment/Plan Assessment: left shoulder rotator cuff arthropathy  Plan: Patient will undergo a left reverse total shoulder by Dr. Veverly Fells at [facility]. Risks benefits and expectations were discussed with the patient. Patient understand risks, benefits and expectations and wishes to proceed.

## 2016-06-11 ENCOUNTER — Encounter: Payer: Self-pay | Admitting: Family

## 2016-06-11 ENCOUNTER — Ambulatory Visit (INDEPENDENT_AMBULATORY_CARE_PROVIDER_SITE_OTHER): Payer: Medicare Other | Admitting: Family

## 2016-06-11 VITALS — BP 136/74 | HR 61 | Temp 97.8°F | Resp 16 | Ht 72.0 in | Wt 234.0 lb

## 2016-06-11 DIAGNOSIS — N401 Enlarged prostate with lower urinary tract symptoms: Secondary | ICD-10-CM

## 2016-06-11 DIAGNOSIS — R350 Frequency of micturition: Secondary | ICD-10-CM

## 2016-06-11 LAB — POCT URINALYSIS DIPSTICK
Bilirubin, UA: NEGATIVE
Blood, UA: NEGATIVE
Glucose, UA: NEGATIVE
Ketones, UA: NEGATIVE
Leukocytes, UA: NEGATIVE
Nitrite, UA: NEGATIVE
Protein, UA: NEGATIVE
Spec Grav, UA: >=1.03
Urobilinogen, UA: NEGATIVE
pH, UA: 6

## 2016-06-11 MED ORDER — TAMSULOSIN HCL 0.4 MG PO CAPS: 0.8000 mg | ORAL_CAPSULE | Freq: Every day | ORAL | 0 refills | Status: DC

## 2016-06-11 NOTE — Progress Notes (Signed)
Subjective:    Patient ID: [name], male    DOB: 12-18-1940, 75 y.o.   MRN: [id number]  Chief Complaint  Patient presents with  . Urinary Frequency    has been experiencing urinary frequency, has been getting up alot during the night to urinate    HPI:  [name] is a 75 y.o. male who  has a past medical history of Blood transfusion without reported diagnosis (02/23/14); BPH (benign prostatic hyperplasia); CTS (carpal tunnel syndrome); Hyperlipidemia; Insomnia; LBP (low back pain); Osteoarthritis; and Sleep apnea. and presents today for an office visit.  Associated symptom of urinary frequency has been going on for a couple of months. Currently maintained on tamsulosin and finasteride. Reports taking the medications as prescribed and denies adverse side effects. Currently getting up 4-5 times per night to urinate. Drinks about 4-5 cups of decaffinated drinks daily. No dysuria, hematuria, flank pain, nausea, vomiting or fevers. No changes in stream or issues starting a stream of urine.    No Known Allergies    Outpatient Medications Prior to Visit  Medication Sig Dispense Refill  . aspirin 81 MG chewable tablet Chew 81 mg by mouth at bedtime.    . Cyanocobalamin (VITAMIN B-12) 1000 MCG SUBL Place 1 tablet (1,000 mcg total) under the tongue daily. 100 tablet 3  . DULoxetine (CYMBALTA) 60 MG capsule TAKE 1 CAPSULE (60 MG TOTAL) BY MOUTH DAILY. 90 capsule 0  . ferrous gluconate (FERGON) 324 MG tablet Take 1 tablet (324 mg total) by mouth daily with breakfast. 100 tablet 1  . finasteride (PROSCAR) 5 MG tablet TAKE 1 TABLET (5 MG TOTAL) BY MOUTH DAILY. 90 tablet 1  . folic acid (V-R FOLIC ACID) A999333 MCG tablet Take 1 tablet (400 mcg total) by mouth daily. 90 tablet 3  . furosemide (LASIX) 20 MG tablet Take 1-2 tablets (20-40 mg total) by mouth daily as needed for edema. 180 tablet 2  . nitroGLYCERIN (NITROSTAT) 0.4 MG SL tablet Place 1 tablet (0.4 mg total) under the tongue  every 5 (five) minutes as needed for chest pain. 20 tablet 3  . ranitidine (ZANTAC) 150 MG tablet Take 1 tablet (150 mg total) by mouth at bedtime. 90 tablet 3  . simvastatin (ZOCOR) 40 MG tablet TAKE 1 TABLET (40 MG TOTAL) BY MOUTH AT BEDTIME. 90 tablet 2  . traMADol (ULTRAM) 50 MG tablet Take 1-2 tablets (50-100 mg total) by mouth 2 (two) times daily as needed for severe pain. 120 tablet 0  . vitamin C (ASCORBIC ACID) 500 MG tablet Take 1 tablet (500 mg total) by mouth daily. 100 tablet 1  . tamsulosin (FLOMAX) 0.4 MG CAPS capsule Take 1 capsule (0.4 mg total) by mouth daily. 90 capsule 2  . finasteride (PROSCAR) 5 MG tablet TAKE 1 TABLET (5 MG TOTAL) BY MOUTH DAILY. 90 tablet 3   No facility-administered medications prior to visit.      Past Medical History:  Diagnosis Date  . Blood transfusion without reported diagnosis 02/23/14   3 units for iron deficiency anemia  . BPH (benign prostatic hyperplasia)   . CTS (carpal tunnel syndrome)    better  . Hyperlipidemia   . Insomnia   . LBP (low back pain)   . Osteoarthritis   . Sleep apnea    no cpap      Review of Systems  Constitutional: Negative for appetite change and fever.  Genitourinary: Negative for dysuria, flank pain, frequency, hematuria and urgency.  Objective:    BP 136/74 (BP Location: Left Arm, Patient Position: Sitting, Cuff Size: Normal)   Pulse 61   Temp 97.8 F (36.6 C) (Oral)   Resp 16   Ht 6' (1.829 m)   Wt 234 lb (106.1 kg)   SpO2 92%   BMI 31.74 kg/m  Nursing note and vital signs reviewed.  Physical Exam  Constitutional: He is oriented to person, place, and time. He appears well-developed and well-nourished. No distress.  Cardiovascular: Normal rate, regular rhythm, normal heart sounds and intact distal pulses.   Pulmonary/Chest: Effort normal and breath sounds normal.  Abdominal: There is no CVA tenderness.  Neurological: He is alert and oriented to person, place, and time.  Skin: Skin is  warm and dry.  Psychiatric: He has a normal mood and affect. His behavior is normal. Judgment and thought content normal.       Assessment & Plan:   Problem List Items Addressed This Visit      Genitourinary   Benign prostatic hyperplasia - Primary    In office urinalysis negative for leukocytes, nitrites, and hematuria. No symptoms of prostatitis appreciated. Urinary frequency with questionable uncontrolled benign prostate hypertrophy versus overactive bladder. Given increased nighttime symptoms, increase Flomax to 0.8 mg daily. Continue current dosage of finasteride. If symptoms worsen or do not improve consider additional anti-spasmodic agents.      Relevant Medications   tamsulosin (FLOMAX) 0.4 MG CAPS capsule    Other Visit Diagnoses    Urinary frequency       Relevant Orders   POCT urinalysis dipstick (Completed)       I have changed [name]'s tamsulosin. I am also having him maintain his Vitamin 0000000, aspirin, folic acid, nitroGLYCERIN, ferrous gluconate, vitamin C, simvastatin, furosemide, ranitidine, traMADol, DULoxetine, and finasteride.   Meds ordered this encounter  Medications  . tamsulosin (FLOMAX) 0.4 MG CAPS capsule    Sig: Take 2 capsules (0.8 mg total) by mouth daily.    Dispense:  30 capsule    Refill:  0    Order Specific Question:   Supervising Provider    Answer:   Pricilla Holm A [id number]     Follow-up: Return if symptoms worsen or fail to improve.  Mauricio Po, FNP

## 2016-06-11 NOTE — Patient Instructions (Signed)
Thank you for choosing [facility].  SUMMARY AND INSTRUCTIONS:  Medication:  Your prescription(s) have been submitted to your pharmacy or been printed and provided for you. Please take as directed and contact our office if you believe you are having problem(s) with the medication(s) or have any questions.  Follow up:  If your symptoms worsen or fail to improve, please contact our office for further instruction, or in case of emergency go directly to the emergency room at the closest medical facility.    Benign Prostatic Hyperplasia An enlarged prostate (benign prostatic hyperplasia) is common in older men. You may experience the following:  Weak urine stream.  Dribbling.  Feeling like the bladder has not emptied completely.  Difficulty starting urination.  Getting up frequently at night to urinate.  Urinating more frequently during the day. HOME CARE INSTRUCTIONS  Monitor your prostatic hyperplasia for any changes. The following actions may help to alleviate any discomfort you are experiencing:  Give yourself time when you urinate.  Stay away from alcohol.  Avoid beverages containing caffeine, such as coffee, tea, and colas, because they can make the problem worse.  Avoid decongestants, antihistamines, and some prescription medicines that can make the problem worse.  Follow up with your health care provider for further treatment as recommended. SEEK MEDICAL CARE IF:  You are experiencing progressive difficulty voiding.  Your urine stream is progressively getting narrower.  You are awaking from sleep with the urge to void more frequently.  You are constantly feeling the need to void.  You experience loss of urine, especially in small amounts. SEEK IMMEDIATE MEDICAL CARE IF:   You develop increased pain with urination or are unable to urinate.  You develop severe abdominal pain, vomiting, a high fever, or fainting.  You develop back pain or blood in your  urine. MAKE SURE YOU:   Understand these instructions.  Will watch your condition.  Will get help right away if you are not doing well or get worse.   This information is not intended to replace advice given to you by your health care provider. Make sure you discuss any questions you have with your health care provider.   Document Released: 08/11/2005 Document Revised: 09/01/2014 Document Reviewed: 01/11/2013 Elsevier Interactive Patient Education [employer].

## 2016-06-11 NOTE — Assessment & Plan Note (Signed)
In office urinalysis negative for leukocytes, nitrites, and hematuria. No symptoms of prostatitis appreciated. Urinary frequency with questionable uncontrolled benign prostate hypertrophy versus overactive bladder. Given increased nighttime symptoms, increase Flomax to 0.8 mg daily. Continue current dosage of finasteride. If symptoms worsen or do not improve consider additional anti-spasmodic agents.

## 2016-07-01 ENCOUNTER — Encounter (HOSPITAL_COMMUNITY)
Admission: RE | Admit: 2016-07-01 | Discharge: 2016-07-01 | Disposition: A | Discharge status: Home / Self Care | Payer: Medicare Other | Source: Ambulatory Visit | Attending: Orthopedic Surgery | Admitting: Orthopedic Surgery

## 2016-07-01 ENCOUNTER — Encounter (HOSPITAL_COMMUNITY): Payer: Self-pay

## 2016-07-01 DIAGNOSIS — G4730 Sleep apnea, unspecified: Secondary | ICD-10-CM | POA: Diagnosis not present

## 2016-07-01 DIAGNOSIS — Z96651 Presence of right artificial knee joint: Secondary | ICD-10-CM | POA: Diagnosis not present

## 2016-07-01 DIAGNOSIS — G4700 Insomnia, unspecified: Secondary | ICD-10-CM | POA: Diagnosis not present

## 2016-07-01 DIAGNOSIS — Z981 Arthrodesis status: Secondary | ICD-10-CM | POA: Diagnosis not present

## 2016-07-01 DIAGNOSIS — I2510 Atherosclerotic heart disease of native coronary artery without angina pectoris: Secondary | ICD-10-CM | POA: Diagnosis not present

## 2016-07-01 DIAGNOSIS — I10 Essential (primary) hypertension: Secondary | ICD-10-CM | POA: Diagnosis not present

## 2016-07-01 DIAGNOSIS — N400 Enlarged prostate without lower urinary tract symptoms: Secondary | ICD-10-CM | POA: Diagnosis not present

## 2016-07-01 DIAGNOSIS — Z87891 Personal history of nicotine dependence: Secondary | ICD-10-CM | POA: Diagnosis not present

## 2016-07-01 DIAGNOSIS — E785 Hyperlipidemia, unspecified: Secondary | ICD-10-CM | POA: Diagnosis not present

## 2016-07-01 DIAGNOSIS — Z7982 Long term (current) use of aspirin: Secondary | ICD-10-CM | POA: Diagnosis not present

## 2016-07-01 DIAGNOSIS — Z9842 Cataract extraction status, left eye: Secondary | ICD-10-CM | POA: Diagnosis not present

## 2016-07-01 DIAGNOSIS — Z79899 Other long term (current) drug therapy: Secondary | ICD-10-CM | POA: Diagnosis not present

## 2016-07-01 DIAGNOSIS — M19012 Primary osteoarthritis, left shoulder: Secondary | ICD-10-CM | POA: Diagnosis not present

## 2016-07-01 DIAGNOSIS — K219 Gastro-esophageal reflux disease without esophagitis: Secondary | ICD-10-CM | POA: Diagnosis not present

## 2016-07-01 DIAGNOSIS — Z9841 Cataract extraction status, right eye: Secondary | ICD-10-CM | POA: Diagnosis not present

## 2016-07-01 HISTORY — DX: Dyspnea, unspecified: R06.00

## 2016-07-01 HISTORY — DX: Pneumonia, unspecified organism: J18.9

## 2016-07-01 HISTORY — DX: Gastro-esophageal reflux disease without esophagitis: K21.9

## 2016-07-01 HISTORY — DX: Anemia, unspecified: D64.9

## 2016-07-01 LAB — BASIC METABOLIC PANEL
Anion gap: 7 (ref 5–15)
BUN: 16 mg/dL (ref 6–20)
CO2: 27 mmol/L (ref 22–32)
Calcium: 9.5 mg/dL (ref 8.9–10.3)
Chloride: 105 mmol/L (ref 101–111)
Creatinine, Ser: 1.06 mg/dL (ref 0.61–1.24)
GFR calc Af Amer: >60 mL/min (ref >60)
GFR calc non Af Amer: >60 mL/min (ref >60)
Glucose, Bld: 104 mg/dL — ABNORMAL HIGH (ref 65–99)
Potassium: 4 mmol/L (ref 3.5–5.1)
Sodium: 139 mmol/L (ref 135–145)

## 2016-07-01 LAB — CBC
HCT: 38.4 % — ABNORMAL LOW (ref 39.0–52.0)
Hemoglobin: 12.2 g/dL — ABNORMAL LOW (ref 13.0–17.0)
MCH: 30.3 pg (ref 26.0–34.0)
MCHC: 31.8 g/dL (ref 30.0–36.0)
MCV: 95.3 fL (ref 78.0–100.0)
Platelets: 207 10*3/uL (ref 150–400)
RBC: 4.03 MIL/uL — ABNORMAL LOW (ref 4.22–5.81)
RDW: 14.4 % (ref 11.5–15.5)
WBC: 4.9 10*3/uL (ref 4.0–10.5)

## 2016-07-01 LAB — SURGICAL PCR SCREEN
MRSA, PCR: NEGATIVE
Staphylococcus aureus: NEGATIVE

## 2016-07-01 NOTE — Pre-Procedure Instructions (Signed)
    [name]  07/01/2016    Your procedure is scheduled on Friday, November 10.  Report to [facility] Admitting at 11:30 AM                  Your surgery or procedure is scheduled for 1:30 AM   Call this number if you have problems the morning of [phone]                For any other questions, please call [phone], Monday - Friday 8 AM - 4 PM.   Remember:  Do not eat food or drink liquids after midnight Thursday, November 9.  Take these medicines the morning of surgery with A SIP OF WATER : DULoxetine (CYMBALTA), finasteride (PROSCAR).  Take if needed: traMADol (ULTRAM), nitroGLYCERIN (NITROSTAT).                 Stop taking Aspiri, Aspirin Products, Vitamins and herbal medications.  Do not take any NSAIDS ie:  Ibuprofen, Advil, Naproxen.   Do not wear jewelry, make-up or nail polish.  Do not wear lotions, powders, or perfumes, or deodorant.  Men may shave face and neck.  Do not bring valuables to the hospital.  [facility] is not responsible for any belongings or valuables.  Contacts, dentures or bridgework may not be worn into surgery.  Leave your suitcase in the car.  After surgery it may be brought to your room.  For patients admitted to the hospital, discharge time will be determined by your treatment team.  Patients discharged the day of surgery will not be allowed to drive home.   Special instructions:  Review  [place] - Preparing For Surgery.  Please read over the following fact sheets that you were given. [place]- Preparing For Surgery and Patient Instructions for Mupirocin Application, Incentive Spirometry, Pain Booklet

## 2016-07-04 ENCOUNTER — Inpatient Hospital Stay (HOSPITAL_COMMUNITY): Payer: Medicare Other | Admitting: Certified Registered Nurse Anesthetist

## 2016-07-04 ENCOUNTER — Encounter (HOSPITAL_COMMUNITY): Payer: Self-pay | Admitting: *Deleted

## 2016-07-04 ENCOUNTER — Other Ambulatory Visit: Payer: Self-pay | Admitting: Hematology and Oncology

## 2016-07-04 ENCOUNTER — Encounter (HOSPITAL_COMMUNITY)
Admission: RE | Disposition: A | Discharge status: Home / Self Care | Payer: Self-pay | Source: Ambulatory Visit | Attending: Orthopedic Surgery

## 2016-07-04 ENCOUNTER — Inpatient Hospital Stay (HOSPITAL_COMMUNITY)
Admission: RE | Admit: 2016-07-04 | Discharge: 2016-07-05 | DRG: 483 | Disposition: A | Discharge status: Home / Self Care | Payer: Medicare Other | Source: Ambulatory Visit | Attending: Orthopedic Surgery | Admitting: Orthopedic Surgery

## 2016-07-04 ENCOUNTER — Inpatient Hospital Stay (HOSPITAL_COMMUNITY): Payer: Medicare Other

## 2016-07-04 DIAGNOSIS — N400 Enlarged prostate without lower urinary tract symptoms: Secondary | ICD-10-CM | POA: Diagnosis present

## 2016-07-04 DIAGNOSIS — I10 Essential (primary) hypertension: Secondary | ICD-10-CM | POA: Diagnosis present

## 2016-07-04 DIAGNOSIS — Z9842 Cataract extraction status, left eye: Secondary | ICD-10-CM

## 2016-07-04 DIAGNOSIS — I2510 Atherosclerotic heart disease of native coronary artery without angina pectoris: Secondary | ICD-10-CM | POA: Diagnosis present

## 2016-07-04 DIAGNOSIS — G8918 Other acute postprocedural pain: Secondary | ICD-10-CM | POA: Diagnosis not present

## 2016-07-04 DIAGNOSIS — Z96612 Presence of left artificial shoulder joint: Secondary | ICD-10-CM

## 2016-07-04 DIAGNOSIS — K219 Gastro-esophageal reflux disease without esophagitis: Secondary | ICD-10-CM | POA: Diagnosis present

## 2016-07-04 DIAGNOSIS — E785 Hyperlipidemia, unspecified: Secondary | ICD-10-CM | POA: Diagnosis present

## 2016-07-04 DIAGNOSIS — Z9841 Cataract extraction status, right eye: Secondary | ICD-10-CM | POA: Diagnosis not present

## 2016-07-04 DIAGNOSIS — Z87891 Personal history of nicotine dependence: Secondary | ICD-10-CM

## 2016-07-04 DIAGNOSIS — Z981 Arthrodesis status: Secondary | ICD-10-CM | POA: Diagnosis not present

## 2016-07-04 DIAGNOSIS — Z7982 Long term (current) use of aspirin: Secondary | ICD-10-CM

## 2016-07-04 DIAGNOSIS — Z79899 Other long term (current) drug therapy: Secondary | ICD-10-CM

## 2016-07-04 DIAGNOSIS — M25512 Pain in left shoulder: Secondary | ICD-10-CM | POA: Diagnosis present

## 2016-07-04 DIAGNOSIS — Z96651 Presence of right artificial knee joint: Secondary | ICD-10-CM | POA: Diagnosis present

## 2016-07-04 DIAGNOSIS — M19012 Primary osteoarthritis, left shoulder: Secondary | ICD-10-CM | POA: Diagnosis not present

## 2016-07-04 DIAGNOSIS — M75102 Unspecified rotator cuff tear or rupture of left shoulder, not specified as traumatic: Secondary | ICD-10-CM | POA: Diagnosis not present

## 2016-07-04 DIAGNOSIS — G4730 Sleep apnea, unspecified: Secondary | ICD-10-CM | POA: Diagnosis present

## 2016-07-04 DIAGNOSIS — Z471 Aftercare following joint replacement surgery: Secondary | ICD-10-CM | POA: Diagnosis not present

## 2016-07-04 DIAGNOSIS — G4700 Insomnia, unspecified: Secondary | ICD-10-CM | POA: Diagnosis not present

## 2016-07-04 HISTORY — PX: REVERSE SHOULDER ARTHROPLASTY: SHX5054

## 2016-07-04 SURGERY — ARTHROPLASTY, SHOULDER, TOTAL, REVERSE
Anesthesia: Regional | Site: Shoulder | Laterality: Left

## 2016-07-04 MED ORDER — EPHEDRINE SULFATE 50 MG/ML IJ SOLN
INTRAMUSCULAR | Status: DC | PRN
  Administered 2016-07-04 (×4): 5 mg via INTRAVENOUS
  Administered 2016-07-04: 10 mg via INTRAVENOUS
  Administered 2016-07-04: 5 mg via INTRAVENOUS
  Administered 2016-07-04: 15 mg via INTRAVENOUS

## 2016-07-04 MED ORDER — HYDROMORPHONE HCL 1 MG/ML IJ SOLN: 0.2500 mg | INTRAMUSCULAR | Status: DC | PRN

## 2016-07-04 MED ORDER — BUPIVACAINE HCL (PF) 0.25 % IJ SOLN
INTRAMUSCULAR | Status: AC
  Filled 2016-07-04: qty 30

## 2016-07-04 MED ORDER — POLYETHYLENE GLYCOL 3350 17 G PO PACK: 17.0000 g | PACK | Freq: Every day | ORAL | Status: DC | PRN

## 2016-07-04 MED ORDER — FINASTERIDE 5 MG PO TABS
5.0000 mg | ORAL_TABLET | Freq: Every day | ORAL | Status: DC
  Administered 2016-07-05: 5 mg via ORAL
  Filled 2016-07-04: qty 1

## 2016-07-04 MED ORDER — OXYCODONE-ACETAMINOPHEN 5-325 MG PO TABS: 1.0000 | ORAL_TABLET | ORAL | 0 refills | Status: DC | PRN

## 2016-07-04 MED ORDER — METHOCARBAMOL 500 MG PO TABS: 500.0000 mg | ORAL_TABLET | Freq: Three times a day (TID) | ORAL | 1 refills | Status: DC | PRN

## 2016-07-04 MED ORDER — METHOCARBAMOL 1000 MG/10ML IJ SOLN: 500.0000 mg | Freq: Four times a day (QID) | INTRAVENOUS | Status: DC | PRN

## 2016-07-04 MED ORDER — TRAMADOL HCL 50 MG PO TABS: 50.0000 mg | ORAL_TABLET | Freq: Two times a day (BID) | ORAL | Status: DC | PRN

## 2016-07-04 MED ORDER — GLYCOPYRROLATE 0.2 MG/ML IJ SOLN
INTRAMUSCULAR | Status: DC | PRN
  Administered 2016-07-04 (×2): .2 mg via INTRAVENOUS

## 2016-07-04 MED ORDER — VITAMIN B-12 1000 MCG SL SUBL
1.0000 | SUBLINGUAL_TABLET | Freq: Every day | SUBLINGUAL | Status: DC
  Filled 2016-07-04: qty 1

## 2016-07-04 MED ORDER — PHENYLEPHRINE 40 MCG/ML (10ML) SYRINGE FOR IV PUSH (FOR BLOOD PRESSURE SUPPORT)
PREFILLED_SYRINGE | INTRAVENOUS | Status: AC
  Filled 2016-07-04: qty 30

## 2016-07-04 MED ORDER — FAMOTIDINE 20 MG PO TABS
20.0000 mg | ORAL_TABLET | Freq: Every day | ORAL | Status: DC
Start: 2016-07-04 — End: 2016-07-05
  Administered 2016-07-04 – 2016-07-05 (×2): 20 mg via ORAL
  Filled 2016-07-04 (×2): qty 1

## 2016-07-04 MED ORDER — CEFAZOLIN SODIUM-DEXTROSE 2-4 GM/100ML-% IV SOLN
2.0000 g | INTRAVENOUS | Status: AC
  Administered 2016-07-04: 2 g via INTRAVENOUS
  Filled 2016-07-04: qty 100

## 2016-07-04 MED ORDER — TAMSULOSIN HCL 0.4 MG PO CAPS
0.8000 mg | ORAL_CAPSULE | Freq: Every day | ORAL | Status: DC
  Administered 2016-07-04 – 2016-07-05 (×2): 0.8 mg via ORAL
  Filled 2016-07-04 (×2): qty 2

## 2016-07-04 MED ORDER — DULOXETINE HCL 60 MG PO CPEP
60.0000 mg | ORAL_CAPSULE | Freq: Every day | ORAL | Status: DC
  Administered 2016-07-04: 60 mg via ORAL
  Filled 2016-07-04: qty 1

## 2016-07-04 MED ORDER — MIDAZOLAM HCL 2 MG/2ML IJ SOLN
INTRAMUSCULAR | Status: AC
  Administered 2016-07-04: 0.5 mg
  Filled 2016-07-04: qty 2

## 2016-07-04 MED ORDER — 0.9 % SODIUM CHLORIDE (POUR BTL) OPTIME
TOPICAL | Status: DC | PRN
  Administered 2016-07-04: 1000 mL

## 2016-07-04 MED ORDER — SUCCINYLCHOLINE CHLORIDE 200 MG/10ML IV SOSY
PREFILLED_SYRINGE | INTRAVENOUS | Status: AC
  Filled 2016-07-04: qty 20

## 2016-07-04 MED ORDER — NAPROXEN SODIUM 220 MG PO TABS: 220.0000 mg | ORAL_TABLET | Freq: Every day | ORAL | Status: DC

## 2016-07-04 MED ORDER — VITAMIN D (ERGOCALCIFEROL) 1.25 MG (50000 UNIT) PO CAPS: 50000.0000 [IU] | ORAL_CAPSULE | Freq: Every day | ORAL | Status: DC

## 2016-07-04 MED ORDER — ONDANSETRON HCL 4 MG/2ML IJ SOLN
INTRAMUSCULAR | Status: AC
  Filled 2016-07-04: qty 2

## 2016-07-04 MED ORDER — VITAMIN C 500 MG PO TABS
500.0000 mg | ORAL_TABLET | Freq: Every day | ORAL | Status: DC
  Administered 2016-07-05: 500 mg via ORAL
  Filled 2016-07-04: qty 1

## 2016-07-04 MED ORDER — PHENYLEPHRINE HCL 10 MG/ML IJ SOLN
INTRAMUSCULAR | Status: DC | PRN
  Administered 2016-07-04: 40 ug via INTRAVENOUS
  Administered 2016-07-04: 80 ug via INTRAVENOUS
  Administered 2016-07-04: 40 ug via INTRAVENOUS
  Administered 2016-07-04: 120 ug via INTRAVENOUS
  Administered 2016-07-04: 40 ug via INTRAVENOUS
  Administered 2016-07-04: 80 ug via INTRAVENOUS

## 2016-07-04 MED ORDER — METHOCARBAMOL 500 MG PO TABS: 500.0000 mg | ORAL_TABLET | Freq: Four times a day (QID) | ORAL | Status: DC | PRN

## 2016-07-04 MED ORDER — POLYSACCHARIDE IRON COMPLEX 150 MG PO CAPS
150.0000 mg | ORAL_CAPSULE | Freq: Every day | ORAL | Status: DC
  Administered 2016-07-04 – 2016-07-05 (×2): 150 mg via ORAL
  Filled 2016-07-04 (×2): qty 1

## 2016-07-04 MED ORDER — FENTANYL CITRATE (PF) 100 MCG/2ML IJ SOLN
INTRAMUSCULAR | Status: AC
  Administered 2016-07-04: 50 ug
  Filled 2016-07-04: qty 2

## 2016-07-04 MED ORDER — FENTANYL CITRATE (PF) 100 MCG/2ML IJ SOLN
INTRAMUSCULAR | Status: DC | PRN
  Administered 2016-07-04: 50 ug via INTRAVENOUS

## 2016-07-04 MED ORDER — STERILE WATER FOR IRRIGATION IR SOLN
Status: DC | PRN
  Administered 2016-07-04: 1000 mL

## 2016-07-04 MED ORDER — ONDANSETRON HCL 4 MG PO TABS: 4.0000 mg | ORAL_TABLET | Freq: Four times a day (QID) | ORAL | Status: DC | PRN

## 2016-07-04 MED ORDER — FOLIC ACID 0.5 MG HALF TAB
500.0000 ug | ORAL_TABLET | Freq: Every day | ORAL | Status: DC
Start: 2016-07-05 — End: 2016-07-05
  Administered 2016-07-05: 0.5 mg via ORAL
  Filled 2016-07-04: qty 1

## 2016-07-04 MED ORDER — ONDANSETRON HCL 4 MG/2ML IJ SOLN
INTRAMUSCULAR | Status: DC | PRN
  Administered 2016-07-04: 4 mg via INTRAVENOUS

## 2016-07-04 MED ORDER — DOCUSATE SODIUM 100 MG PO CAPS
100.0000 mg | ORAL_CAPSULE | Freq: Two times a day (BID) | ORAL | Status: DC
  Administered 2016-07-04 – 2016-07-05 (×2): 100 mg via ORAL
  Filled 2016-07-04 (×2): qty 1

## 2016-07-04 MED ORDER — CEFAZOLIN SODIUM-DEXTROSE 2-4 GM/100ML-% IV SOLN
2.0000 g | Freq: Four times a day (QID) | INTRAVENOUS | Status: AC
  Administered 2016-07-04 – 2016-07-05 (×3): 2 g via INTRAVENOUS
  Filled 2016-07-04 (×4): qty 100

## 2016-07-04 MED ORDER — PROPOFOL 10 MG/ML IV BOLUS
INTRAVENOUS | Status: AC
  Filled 2016-07-04: qty 40

## 2016-07-04 MED ORDER — PROMETHAZINE HCL 25 MG/ML IJ SOLN: 6.2500 mg | INTRAMUSCULAR | Status: DC | PRN

## 2016-07-04 MED ORDER — SUCCINYLCHOLINE CHLORIDE 20 MG/ML IJ SOLN
INTRAMUSCULAR | Status: DC | PRN
  Administered 2016-07-04: 120 mg via INTRAVENOUS

## 2016-07-04 MED ORDER — OXYCODONE HCL 5 MG PO TABS
5.0000 mg | ORAL_TABLET | ORAL | Status: DC | PRN
  Administered 2016-07-04 – 2016-07-05 (×4): 10 mg via ORAL
  Filled 2016-07-04 (×4): qty 2

## 2016-07-04 MED ORDER — LACTATED RINGERS IV SOLN
INTRAVENOUS | Status: DC | PRN
  Administered 2016-07-04 (×2): via INTRAVENOUS

## 2016-07-04 MED ORDER — NAPROXEN SODIUM 275 MG PO TABS
250.0000 mg | ORAL_TABLET | Freq: Every day | ORAL | Status: DC
  Filled 2016-07-04: qty 1

## 2016-07-04 MED ORDER — ONDANSETRON HCL 4 MG/2ML IJ SOLN: 4.0000 mg | Freq: Four times a day (QID) | INTRAMUSCULAR | Status: DC | PRN

## 2016-07-04 MED ORDER — SIMVASTATIN 40 MG PO TABS
40.0000 mg | ORAL_TABLET | Freq: Every day | ORAL | Status: DC
  Administered 2016-07-04: 40 mg via ORAL
  Filled 2016-07-04: qty 1

## 2016-07-04 MED ORDER — VITAMIN D 1000 UNITS PO TABS
1000.0000 [IU] | ORAL_TABLET | Freq: Every day | ORAL | Status: DC
  Administered 2016-07-05: 1000 [IU] via ORAL
  Filled 2016-07-04: qty 1

## 2016-07-04 MED ORDER — HYDROMORPHONE HCL 2 MG/ML IJ SOLN
1.0000 mg | INTRAMUSCULAR | Status: DC | PRN
  Administered 2016-07-05 (×2): 1 mg via INTRAVENOUS
  Filled 2016-07-04 (×2): qty 1

## 2016-07-04 MED ORDER — LIDOCAINE HCL (CARDIAC) 20 MG/ML IV SOLN
INTRAVENOUS | Status: DC | PRN
  Administered 2016-07-04: 20 mg via INTRAVENOUS

## 2016-07-04 MED ORDER — FENTANYL CITRATE (PF) 100 MCG/2ML IJ SOLN
INTRAMUSCULAR | Status: AC
  Filled 2016-07-04: qty 2

## 2016-07-04 MED ORDER — ALBUMIN HUMAN 5 % IV SOLN
INTRAVENOUS | Status: DC | PRN
  Administered 2016-07-04: 14:00:00 via INTRAVENOUS

## 2016-07-04 MED ORDER — CHLORHEXIDINE GLUCONATE 4 % EX LIQD: 60.0000 mL | Freq: Once | CUTANEOUS | Status: DC

## 2016-07-04 MED ORDER — BUPIVACAINE HCL (PF) 0.25 % IJ SOLN
INTRAMUSCULAR | Status: DC | PRN
  Administered 2016-07-04: 7 mL

## 2016-07-04 MED ORDER — SODIUM CHLORIDE 0.9 % IV SOLN: INTRAVENOUS | Status: DC

## 2016-07-04 MED ORDER — LACTATED RINGERS IV SOLN
INTRAVENOUS | Status: DC
  Administered 2016-07-04: 12:00:00 via INTRAVENOUS

## 2016-07-04 MED ORDER — FUROSEMIDE 20 MG PO TABS: 20.0000 mg | ORAL_TABLET | Freq: Every day | ORAL | Status: DC | PRN

## 2016-07-04 MED ORDER — METOCLOPRAMIDE HCL 5 MG/ML IJ SOLN: 5.0000 mg | Freq: Three times a day (TID) | INTRAMUSCULAR | Status: DC | PRN

## 2016-07-04 MED ORDER — PROPOFOL 10 MG/ML IV BOLUS
INTRAVENOUS | Status: DC | PRN
  Administered 2016-07-04: 200 mg via INTRAVENOUS
  Administered 2016-07-04: 50 mg via INTRAVENOUS

## 2016-07-04 MED ORDER — NITROGLYCERIN 0.4 MG SL SUBL: 0.4000 mg | SUBLINGUAL_TABLET | SUBLINGUAL | Status: DC | PRN

## 2016-07-04 MED ORDER — MENTHOL 3 MG MT LOZG: 1.0000 | LOZENGE | OROMUCOSAL | Status: DC | PRN

## 2016-07-04 MED ORDER — ACETAMINOPHEN 650 MG RE SUPP: 650.0000 mg | Freq: Four times a day (QID) | RECTAL | Status: DC | PRN

## 2016-07-04 MED ORDER — PHENYLEPHRINE HCL 10 MG/ML IJ SOLN
INTRAVENOUS | Status: DC | PRN
  Administered 2016-07-04: 25 ug/min via INTRAVENOUS

## 2016-07-04 MED ORDER — MIDAZOLAM HCL 2 MG/2ML IJ SOLN
INTRAMUSCULAR | Status: AC
  Filled 2016-07-04: qty 2

## 2016-07-04 MED ORDER — METOCLOPRAMIDE HCL 5 MG PO TABS: 5.0000 mg | ORAL_TABLET | Freq: Three times a day (TID) | ORAL | Status: DC | PRN

## 2016-07-04 MED ORDER — ACETAMINOPHEN 325 MG PO TABS: 650.0000 mg | ORAL_TABLET | Freq: Four times a day (QID) | ORAL | Status: DC | PRN

## 2016-07-04 MED ORDER — BUPIVACAINE-EPINEPHRINE (PF) 0.5% -1:200000 IJ SOLN
INTRAMUSCULAR | Status: DC | PRN
  Administered 2016-07-04: 25 mL via PERINEURAL

## 2016-07-04 MED ORDER — ASPIRIN 81 MG PO CHEW
81.0000 mg | CHEWABLE_TABLET | Freq: Every day | ORAL | Status: DC
Start: 2016-07-04 — End: 2016-07-05
  Administered 2016-07-04: 81 mg via ORAL
  Filled 2016-07-04: qty 1

## 2016-07-04 MED ORDER — PHENOL 1.4 % MT LIQD: 1.0000 | OROMUCOSAL | Status: DC | PRN

## 2016-07-04 SURGICAL SUPPLY — 67 items
BIT DRILL 170X2.5X (BIT) ×1 IMPLANT
BIT DRILL 5/64X5 DISP (BIT) ×2 IMPLANT
BIT DRL 170X2.5X (BIT) ×1
BLADE SAG 18X100X1.27 (BLADE) ×2 IMPLANT
CAPT SHLDR REVTOTAL 1 ×2 IMPLANT
COVER SURGICAL LIGHT HANDLE (MISCELLANEOUS) ×2 IMPLANT
DRAPE IMP U-DRAPE 54X76 (DRAPES) ×4 IMPLANT
DRAPE INCISE IOBAN 66X45 STRL (DRAPES) ×2 IMPLANT
DRAPE ORTHO SPLIT 77X108 STRL (DRAPES) ×4
DRAPE SURG ORHT 6 SPLT 77X108 (DRAPES) ×2 IMPLANT
DRAPE U-SHAPE 47X51 STRL (DRAPES) ×2 IMPLANT
DRAPE X-RAY CASS 24X20 (DRAPES) IMPLANT
DRILL 2.5 (BIT) ×2
DRSG ADAPTIC 3X8 NADH LF (GAUZE/BANDAGES/DRESSINGS) ×2 IMPLANT
DRSG PAD ABDOMINAL 8X10 ST (GAUZE/BANDAGES/DRESSINGS) ×2 IMPLANT
DURAPREP 26ML APPLICATOR (WOUND CARE) ×2 IMPLANT
ELECT BLADE 4.0 EZ CLEAN MEGAD (MISCELLANEOUS) ×2
ELECT NEEDLE TIP 2.8 STRL (NEEDLE) ×2 IMPLANT
ELECT REM PT RETURN 9FT ADLT (ELECTROSURGICAL) ×2
ELECTRODE BLDE 4.0 EZ CLN MEGD (MISCELLANEOUS) ×1 IMPLANT
ELECTRODE REM PT RTRN 9FT ADLT (ELECTROSURGICAL) ×1 IMPLANT
GAUZE SPONGE 4X4 12PLY STRL (GAUZE/BANDAGES/DRESSINGS) ×2 IMPLANT
GLENO PIN (PIN) ×2 IMPLANT
GLOVE BIOGEL PI ORTHO PRO 7.5 (GLOVE) ×1
GLOVE BIOGEL PI ORTHO PRO SZ8 (GLOVE) ×1
GLOVE ORTHO TXT STRL SZ7.5 (GLOVE) ×2 IMPLANT
GLOVE PI ORTHO PRO STRL 7.5 (GLOVE) ×1 IMPLANT
GLOVE PI ORTHO PRO STRL SZ8 (GLOVE) ×1 IMPLANT
GLOVE SURG ORTHO 8.5 STRL (GLOVE) ×2 IMPLANT
GOWN STRL REUS W/ TWL LRG LVL3 (GOWN DISPOSABLE) ×1 IMPLANT
GOWN STRL REUS W/ TWL XL LVL3 (GOWN DISPOSABLE) ×2 IMPLANT
GOWN STRL REUS W/TWL LRG LVL3 (GOWN DISPOSABLE) ×2
GOWN STRL REUS W/TWL XL LVL3 (GOWN DISPOSABLE) ×4
HANDPIECE INTERPULSE COAX TIP (DISPOSABLE)
KIT BASIN OR (CUSTOM PROCEDURE TRAY) ×2 IMPLANT
KIT ROOM TURNOVER OR (KITS) ×2 IMPLANT
MANIFOLD NEPTUNE II (INSTRUMENTS) ×2 IMPLANT
NEEDLE 1/2 CIR MAYO (NEEDLE) ×2 IMPLANT
NEEDLE HYPO 25GX1X1/2 BEV (NEEDLE) ×2 IMPLANT
NS IRRIG 1000ML POUR BTL (IV SOLUTION) ×2 IMPLANT
PACK SHOULDER (CUSTOM PROCEDURE TRAY) ×2 IMPLANT
PAD ARMBOARD 7.5X6 YLW CONV (MISCELLANEOUS) ×4 IMPLANT
PIN GUIDE 1.2 (PIN) ×2 IMPLANT
PIN METAGLENE 2.5 (PIN) ×2 IMPLANT
SET HNDPC FAN SPRY TIP SCT (DISPOSABLE) IMPLANT
SLING ARM IMMOBILIZER LRG (SOFTGOODS) ×2 IMPLANT
SLING ARM LRG ADULT FOAM STRAP (SOFTGOODS) ×2 IMPLANT
SLING ARM MED ADULT FOAM STRAP (SOFTGOODS) IMPLANT
SPONGE GAUZE 4X4 12PLY STER LF (GAUZE/BANDAGES/DRESSINGS) ×2 IMPLANT
SPONGE LAP 18X18 X RAY DECT (DISPOSABLE) IMPLANT
SPONGE LAP 4X18 X RAY DECT (DISPOSABLE) ×2 IMPLANT
STRIP CLOSURE SKIN 1/2X4 (GAUZE/BANDAGES/DRESSINGS) ×2 IMPLANT
SUCTION FRAZIER HANDLE 10FR (MISCELLANEOUS) ×1
SUCTION TUBE FRAZIER 10FR DISP (MISCELLANEOUS) ×1 IMPLANT
SUT FIBERWIRE #2 38 T-5 BLUE (SUTURE) ×4
SUT MNCRL AB 4-0 PS2 18 (SUTURE) ×2 IMPLANT
SUT VIC AB 2-0 CT1 27 (SUTURE) ×2
SUT VIC AB 2-0 CT1 TAPERPNT 27 (SUTURE) ×1 IMPLANT
SUT VICRYL 0 CT 1 36IN (SUTURE) ×2 IMPLANT
SUTURE FIBERWR #2 38 T-5 BLUE (SUTURE) ×2 IMPLANT
SYR CONTROL 10ML LL (SYRINGE) ×2 IMPLANT
TOWEL OR 17X24 6PK STRL BLUE (TOWEL DISPOSABLE) ×2 IMPLANT
TOWEL OR 17X26 10 PK STRL BLUE (TOWEL DISPOSABLE) ×2 IMPLANT
TOWER CARTRIDGE SMART MIX (DISPOSABLE) IMPLANT
TRAY FOLEY CATH 16FRSI W/METER (SET/KITS/TRAYS/PACK) IMPLANT
WATER STERILE IRR 1000ML POUR (IV SOLUTION) ×2 IMPLANT
YANKAUER SUCT BULB TIP NO VENT (SUCTIONS) ×2 IMPLANT

## 2016-07-04 NOTE — Anesthesia Procedure Notes (Signed)
Anesthesia Regional Block:  Interscalene brachial plexus block  Pre-Anesthetic Checklist: ,, timeout performed, Correct Patient, Correct Site, Correct Laterality, Correct Procedure, Correct Position, site marked, Risks and benefits discussed,  Surgical consent,  Pre-op evaluation,  At surgeon's request and post-op pain management  Laterality: Left  Prep: chloraprep       Needles:  Injection technique: Single-shot  Needle Type: Echogenic Stimulator Needle     Needle Length: 9cm 9 cm Needle Gauge: 21 and 21 G    Additional Needles:  Procedures: ultrasound guided (picture in chart) and nerve stimulator Interscalene brachial plexus block  Nerve Stimulator or Paresthesia:  Response: deltoid and biceps, 0.5 mA,   Additional Responses:   Narrative:  Start time: 07/04/2016 11:54 AM End time: 07/04/2016 12:01 PM Injection made incrementally with aspirations every 5 mL.  Performed by: Personally  Anesthesiologist: Suzette Battiest

## 2016-07-04 NOTE — Discharge Instructions (Signed)
Ice to the shoulder as much as you can.  May remove the sling and use the arm for light activity of daily living  Keep the incision covered and clean and dry for one week, then ok to get it wet in the shower.  Use gel bandages that nurses provide on discharge to cover the incision (change every three days)  Follow up with Dr Veverly Fells in two weeks in the office [phone]

## 2016-07-04 NOTE — Anesthesia Preprocedure Evaluation (Addendum)
Anesthesia Evaluation  Patient identified by MRN, date of birth, ID band Patient awake    Reviewed: Allergy & Precautions, NPO status , Patient's Chart, lab work & pertinent test results  Airway Mallampati: II  TM Distance: >3 FB Neck ROM: Full    Dental  (+) Dental Advisory Given   Pulmonary sleep apnea , former smoker,    breath sounds clear to auscultation       Cardiovascular hypertension, Pt. on medications + CAD   Rhythm:Regular Rate:Normal     Neuro/Psych negative neurological ROS     GI/Hepatic Neg liver ROS, GERD  ,  Endo/Other  negative endocrine ROS  Renal/GU negative Renal ROS     Musculoskeletal  (+) Arthritis ,   Abdominal   Peds  Hematology  (+) anemia ,   Anesthesia Other Findings   Reproductive/Obstetrics                            Lab Results  Component Value Date   WBC 4.9 07/01/2016   HGB 12.2 (L) 07/01/2016   HCT 38.4 (L) 07/01/2016   MCV 95.3 07/01/2016   PLT 207 07/01/2016   Lab Results  Component Value Date   CREATININE 1.06 07/01/2016   BUN 16 07/01/2016   NA 139 07/01/2016   K 4.0 07/01/2016   CL 105 07/01/2016   CO2 27 07/01/2016    Anesthesia Physical Anesthesia Plan  ASA: II  Anesthesia Plan: General and Regional   Post-op Pain Management:  Regional for Post-op pain   Induction: Intravenous  Airway Management Planned: Oral ETT  Additional Equipment:   Intra-op Plan:   Post-operative Plan: Extubation in OR  Informed Consent: I have reviewed the patients History and Physical, chart, labs and discussed the procedure including the risks, benefits and alternatives for the proposed anesthesia with the patient or authorized representative who has indicated his/her understanding and acceptance.   Dental advisory given  Plan Discussed with: CRNA  Anesthesia Plan Comments:         Anesthesia Quick Evaluation

## 2016-07-04 NOTE — Progress Notes (Signed)
Patient complained of being wet. Ice pack from PACU was leaking on to patient and onto his dressing Tyleek Smick A Archivist, RN

## 2016-07-04 NOTE — Interval H&P Note (Signed)
History and Physical Interval Note:  07/04/2016 12:28 PM  [name]  has presented today for surgery, with the diagnosis of Left Shoulder OA and Rotator Cuff Insufficiency  The various methods of treatment have been discussed with the patient and family. After consideration of risks, benefits and other options for treatment, the patient has consented to  Procedure(s): LEFT REVERSE SHOULDER ARTHROPLASTY (Left) as a surgical intervention .  The patient's history has been reviewed, patient examined, no change in status, stable for surgery.  I have reviewed the patient's chart and labs.  Questions were answered to the patient's satisfaction.     Amor Packard,STEVEN R

## 2016-07-04 NOTE — Transfer of Care (Signed)
Immediate Anesthesia Transfer of Care Note  Patient: [name]  Procedure(s) Performed: Procedure(s): LEFT REVERSE SHOULDER ARTHROPLASTY (Left)  Patient Location: PACU  Anesthesia Type:General  Level of Consciousness: awake, alert , patient cooperative and responds to stimulation  Airway & Oxygen Therapy: Patient Spontanous Breathing  Post-op Assessment: Report given to RN, Post -op Vital signs reviewed and stable and Patient moving all extremities X 4  Post vital signs: Reviewed and stable  Last Vitals:  Vitals:   07/04/16 1200 07/04/16 1514  BP: (!) 142/75 112/63  Pulse: 60 86  Resp: 17 18  Temp:  36.2 C    Last Pain:  Vitals:   07/04/16 1141  TempSrc: Oral         Complications: No apparent anesthesia complications

## 2016-07-04 NOTE — Anesthesia Postprocedure Evaluation (Signed)
Anesthesia Post Note  Patient: [name]  Procedure(s) Performed: Procedure(s) (LRB): LEFT REVERSE SHOULDER ARTHROPLASTY (Left)  Patient location during evaluation: PACU Anesthesia Type: General and Regional Level of consciousness: awake and alert Pain management: pain level controlled Vital Signs Assessment: post-procedure vital signs reviewed and stable Respiratory status: spontaneous breathing, nonlabored ventilation, respiratory function stable and patient connected to nasal cannula oxygen Cardiovascular status: blood pressure returned to baseline and stable Postop Assessment: no signs of nausea or vomiting Anesthetic complications: no    Last Vitals:  Vitals:   07/04/16 1530 07/04/16 1545  BP: 125/71 113/65  Pulse: (!) 101 79  Resp: 19 16  Temp:      Last Pain:  Vitals:   07/04/16 1514  TempSrc:   PainSc: 0-No pain                 Tiajuana Amass

## 2016-07-04 NOTE — Anesthesia Procedure Notes (Signed)
Procedure Name: Intubation Date/Time: 07/04/2016 1:05 PM Performed by: Tressia Miners LEFFEW Pre-anesthesia Checklist: Patient identified, Patient being monitored, Timeout performed, Emergency Drugs available and Suction available Patient Re-evaluated:Patient Re-evaluated prior to inductionOxygen Delivery Method: Circle System Utilized Preoxygenation: Pre-oxygenation with 100% oxygen Intubation Type: IV induction Ventilation: Mask ventilation without difficulty Laryngoscope Size: Mac and 4 Grade View: Grade II Tube type: Oral Tube size: 7.5 mm Number of attempts: 1 Airway Equipment and Method: Stylet Placement Confirmation: ETT inserted through vocal cords under direct vision,  positive ETCO2 and breath sounds checked- equal and bilateral Secured at: 22 cm Tube secured with: Tape Dental Injury: Teeth and Oropharynx as per pre-operative assessment

## 2016-07-04 NOTE — Brief Op Note (Signed)
07/04/2016  2:44 PM  PATIENT:  [name]  75 y.o. male  PRE-OPERATIVE DIAGNOSIS:  Left Shoulder OA and Rotator Cuff Insufficiency  POST-OPERATIVE DIAGNOSIS:  Left Shoulder OA and Rotator Cuff Insufficiency  PROCEDURE:  Procedure(s): LEFT REVERSE SHOULDER ARTHROPLASTY (Left) DePuy Delta Xtend  SURGEON:  Surgeon(s) and Role:    * Netta Cedars, MD - Primary  PHYSICIAN ASSISTANT:   ASSISTANTS: Ventura Bruns, PA-C   ANESTHESIA:   regional and general  EBL:  Total I/O In: 1250 [I.V.:1000; IV Piggyback:250] Out: 200 [Blood:200]  BLOOD ADMINISTERED:none  DRAINS: none   LOCAL MEDICATIONS USED:  MARCAINE     SPECIMEN:  No Specimen  DISPOSITION OF SPECIMEN:  N/A  COUNTS:  YES  TOURNIQUET:  * No tourniquets in log *  DICTATION: .Other Dictation: Dictation Number [id number]  PLAN OF CARE: Admit to inpatient   PATIENT DISPOSITION:  PACU - hemodynamically stable.   Delay start of Pharmacological VTE agent (>24hrs) due to surgical blood loss or risk of bleeding: not applicable

## 2016-07-05 LAB — BASIC METABOLIC PANEL
Anion gap: 8 (ref 5–15)
BUN: 11 mg/dL (ref 6–20)
CO2: 27 mmol/L (ref 22–32)
Calcium: 8.7 mg/dL — ABNORMAL LOW (ref 8.9–10.3)
Chloride: 103 mmol/L (ref 101–111)
Creatinine, Ser: 0.71 mg/dL (ref 0.61–1.24)
GFR calc Af Amer: >60 mL/min (ref >60)
GFR calc non Af Amer: >60 mL/min (ref >60)
Glucose, Bld: 118 mg/dL — ABNORMAL HIGH (ref 65–99)
Potassium: 4 mmol/L (ref 3.5–5.1)
Sodium: 138 mmol/L (ref 135–145)

## 2016-07-05 LAB — HEMOGLOBIN AND HEMATOCRIT, BLOOD
HCT: 32.4 % — ABNORMAL LOW (ref 39.0–52.0)
Hemoglobin: 10.3 g/dL — ABNORMAL LOW (ref 13.0–17.0)

## 2016-07-05 MED ORDER — VITAMIN B-12 1000 MCG PO TABS
1000.0000 ug | ORAL_TABLET | Freq: Every day | ORAL | Status: DC
  Administered 2016-07-05: 1000 ug via ORAL
  Filled 2016-07-05: qty 1

## 2016-07-05 MED ORDER — NAPROXEN SODIUM 275 MG PO TABS
275.0000 mg | ORAL_TABLET | Freq: Every day | ORAL | Status: DC
  Filled 2016-07-05: qty 1

## 2016-07-05 NOTE — Op Note (Signed)
[name], [name]            ACCOUNT NO.:  [id number]  MEDICAL RECORD NO.:  [id number]  LOCATION:  5N05C                        FACILITY:  [facility]  PHYSICIAN:  Doran Heater. Veverly Fells, M.D. DATE OF BIRTH:  1941/01/20  DATE OF PROCEDURE:  07/04/2016 DATE OF DISCHARGE:                              OPERATIVE REPORT   PREOPERATIVE DIAGNOSES:  Left shoulder osteoarthritis and rotator cuff insufficiency.  POSTOPERATIVE DIAGNOSES:  Left shoulder osteoarthritis and rotator cuff insufficiency.  PROCEDURE PERFORMED:  Left shoulder reverse total shoulder arthroplasty using DePuy Delta Xtend prosthesis.  ATTENDING SURGEON:  Doran Heater. Veverly Fells, M.D.  ASSISTANT:  Abbott Pao. Dixon, PA-C, who scrubbed the entire procedure and necessary for satisfactory completion of surgery.  ANESTHESIA:  General anesthesia was used plus interscalene block.  ESTIMATED BLOOD LOSS:  200 mL.  FLUID REPLACEMENT:  1500 mL crystalloid.  INSTRUMENT COUNTS:  Correct.  COMPLICATIONS:  No complications.  ANTIBIOTICS:  Perioperative antibiotics were given.  INDICATIONS:  The patient is a 75 year old male with worsening left shoulder pain and shoulder dysfunction secondary to severe end-stage arthritis and rotator cuff insufficiency.  The patient presents having failed conservative management including injections, modification, activity, anti-inflammatories, and presents now for operative total shoulder arthroplasty with reverse shoulder placement to restore fixed fulcrum mechanics and restore function and eliminate pain to the shoulder.  Informed consent obtained.  DESCRIPTION OF PROCEDURE:  After an adequate level of anesthesia achieved, the patient was positioned in modified beach-chair position. The left shoulder was correctly identified, sterilely prepped and draped in usual manner, time-out called.  We entered the shoulder in standard deltopectoral approach starting at the coracoid process, extending down to  the anterior humerus.  Dissection down through subcutaneous tissues using Bovie to identify the cephalic vein, took it laterally the deltoid, pectoralis taken medially.  Conjoint tendon identified and retracted medially.  The remaining subscap tissue, which was not good quality was released off the lesser tuberosity.  The biceps were tenodesed in situ with a 0 Vicryl figure-of-eight suture x2.  We then tagged the subscapularis remnant for protection of the axillary nerve and retraction released the capsule off the inferior humerus with progressive external rotation all the way to the posterior aspect of the shoulder.  We then went ahead and released the biceps tendon.  Actually, it had already torn previously.  We extended the shoulder, and we were able to identify complete supraspinatus and infraspinatus tear with retraction.  The teres minor appeared to be intact.  We entered the proximal humerus using a 6-mm reamer.  We reamed sequentially up to size 16 mm in diameter.  We then went ahead and placed our 16 mm intramedullary resection guide and resected the humeral head referencing off that with 10 degrees of retroversion.  Once we had our head resected, we kept the head for its bone graft that we would be using. We then removed the excess osteophytes using a rongeur, and at this point, we went ahead and milled for the metaphyseal size 2 left metaphyseal component.  Once we had that reamed, we then impacted the 16 body with the epi to left metaphysis, set on the 0 setting and placed in 10 degrees of retroversion.  Once  that was in position, we retracted the shoulder posteriorly, did a 360-degree capsular removal and glenoid labral removal.  At this point, the glenoid face was devoid of cartilage.  We placed our centered guide pin, milled, reamed for the metaglene, and then drilled our central peg hole for the metaglene after peripheral hand reaming.  We next found a big cyst inferior to  our central hole.  We bone grafted that extensively with graft from the head and impacted that.  We then seated our metaglene into position impacting that securely in place.  We had good stability with that implant.  We then placed a 48 screw inferiorly with good purchase, a 30 proximally in the base of the coracoid and 18 anteriorly with good purchase.  At this point, we placed a 42 standard glenoid sphere in position after full irrigation.  We impacted and screwed that home.  Once it was secured, we then reduced the shoulder with a 42+ 3 trial.  We had nice stability. No gapping with external rotation in the inferior pole, conjoined tendon nice and tight.  We removed that trial component, irrigated thoroughly, removed the humeral stem.  We irrigated the canal and then used impaction grafting technique with the press-fit HA coated 16 body and epi 2 left metaphysis, set on 0 setting, impacted in 10 degrees of retroversion.  Once that was impacted, we selected the real 42+ 3 poly, inserted that in place, impacted that, and went ahead and reduced the shoulder with a nice little pop.  We had good stability and full range of motion.  No impingement.  We thoroughly irrigated.  We clipped off the remnant of the subscap, checked for any more active bleeding, and none was encountered.  We then irrigated again and then closed the deltopectoral interval with 0 Vicryl suture, followed by 2-0 Vicryl for subcutaneous closure and 4-0 Monocryl for skin.  Sterile dressing was applied followed by a shoulder sling.  The patient tolerated the surgery well.     Doran Heater. Veverly Fells, M.D.     SRN/MEDQ  D:  07/04/2016  T:  07/05/2016  Job:  [id number]

## 2016-07-05 NOTE — Progress Notes (Signed)
   Subjective: 1 Day Post-Op Procedure(s) (LRB): LEFT REVERSE SHOULDER ARTHROPLASTY (Left) Patient reports pain as mild.   Patient seen in rounds with Dr. Gladstone Lighter for Dr. Veverly Fells. Patient is well, and has had no acute complaints or problems. Reports desire to go home. Reports that pain is under fair control. No issues overnight.    Objective: Vital signs in last 24 hours: Temp:  [97.2 F (36.2 C)-98.9 F (37.2 C)] 98.9 F (37.2 C) (11/11 0407) Pulse Rate:  [56-101] 71 (11/11 0407) Resp:  [13-19] 16 (11/11 0407) BP: (101-142)/(46-75) 101/54 (11/11 0407) SpO2:  [85 %-100 %] 85 % (11/11 0407) Weight:  [108.2 kg (238 lb 8 oz)] 108.2 kg (238 lb 8 oz) (11/10 1141)  Intake/Output from previous day:  Intake/Output Summary (Last 24 hours) at 07/05/16 0807 Last data filed at 07/05/16 0104  Gross per 24 hour  Intake             1370 ml  Output              600 ml  Net              770 ml     Labs:  Recent Labs  07/05/16 0448  HGB 10.3*    Recent Labs  07/05/16 0448  HCT 32.4*    Recent Labs  07/05/16 0448  NA 138  K 4.0  CL 103  CO2 27  BUN 11  CREATININE 0.71  GLUCOSE 118*  CALCIUM 8.7*    EXAM General - Patient is Alert and Oriented Extremity - Neurologically intact Sensation intact distally Intact pulses distally No cellulitis present Dressing/Incision - clean, dry, no drainage Motor Function - intact, moving hand and fingers well on exam.   Past Medical History:  Diagnosis Date  . Anemia   . Blood transfusion without reported diagnosis 02/23/2014   3 units for iron deficiency anemia. as a baby - whenhe had pneumonia  . BPH (benign prostatic hyperplasia)   . CTS (carpal tunnel syndrome)    better  . Dyspnea   . GERD (gastroesophageal reflux disease)   . Hyperlipidemia   . Insomnia   . LBP (low back pain)   . Osteoarthritis   . Pneumonia    as a baby  . Sleep apnea    no cpap    Assessment/Plan: 1 Day Post-Op Procedure(s) (LRB): LEFT  REVERSE SHOULDER ARTHROPLASTY (Left) Active Problems:   S/P shoulder replacement, left  Estimated body mass index is 32.35 kg/m as calculated from the following:   Height as of this encounter: 6' (1.829 m).   Weight as of this encounter: 108.2 kg (238 lb 8 oz). Advance diet Up with therapy D/C IV fluids Discharge home   Continue use of sling. Will have patient work with therapy this morning. DC home after therapy. Discharge instructions given.   Ardeen Jourdain, PA-C Orthopaedic Surgery 07/05/2016, 8:07 AM

## 2016-07-05 NOTE — Evaluation (Signed)
Occupational Therapy Evaluation Patient Details Name: [name] MRN: [id number] DOB: December 17, 1940 Today's Date: 07/05/2016    History of Present Illness LEFT REVERSE SHOULDER ARTHROPLASTY (Left)   Clinical Impression   Pt admitted with the above diagnoses and presents with below problem list. PTA pt was independent with ADLs. Pt is currently setup to min A with ADLs. Spouse able to assist as needed at home and included in education. Plan is d/c today. Ok to d/c from OT standpoint. No further acute OT needs indicated at this time.     Follow Up Recommendations  Supervision - Intermittent;Other (comment) (OOB/mobility)    Equipment Recommendations  None recommended by OT    Recommendations for Other Services       Precautions / Restrictions Precautions Precautions: Shoulder Type of Shoulder Precautions: active protocol Shoulder Interventions: Shoulder sling/immobilizer;For comfort (for sleep) Precaution Booklet Issued: Yes (comment) Precaution Comments: reviewed Required Braces or Orthoses: Sling Restrictions Weight Bearing Restrictions: Yes LUE Weight Bearing: Non weight bearing      Mobility Bed Mobility Overal bed mobility: Modified Independent             General bed mobility comments: HOB elevated  Transfers Overall transfer level: Needs assistance Equipment used: None Transfers: Sit to/from Stand Sit to Stand: Supervision         General transfer comment: from EOB, toilet, and recliner    Balance Overall balance assessment: No apparent balance deficits (not formally assessed)                                          ADL Overall ADL's : Needs assistance/impaired Eating/Feeding: Set up;Sitting   Grooming: Minimal assistance;Sitting   Upper Body Bathing: Minimal assitance;Sitting   Lower Body Bathing: Minimal assistance;Sit to/from stand   Upper Body Dressing : Minimal assistance;Sitting   Lower Body Dressing:  Minimal assistance;Sit to/from stand   Toilet Transfer: Min guard;Ambulation;Regular Toilet   Toileting- Clothing Manipulation and Hygiene: Set up;Min guard;Sitting/lateral lean;Sit to/from stand   Tub/ Shower Transfer: Walk-in shower;Min guard;Ambulation   Functional mobility during ADLs: Min guard General ADL Comments: ADL, sling, and exercise education provided to pt and spouse. Spouse able to assist with ADLs as needed at home. Pt completed in-room functional mobility, toileting, and grooming task at sink.      Vision     Perception     Praxis      Pertinent Vitals/Pain Pain Assessment: 0-10 Pain Score: 8  Pain Location: L shoulder Pain Descriptors / Indicators: Aching;Grimacing;Sore;Tightness Pain Intervention(s): Limited activity within patient's tolerance;Monitored during session;Repositioned;Patient requesting pain meds-RN notified;RN gave pain meds during session;Ice applied     Hand Dominance Right   Extremity/Trunk Assessment Upper Extremity Assessment Upper Extremity Assessment: LUE deficits/detail LUE Deficits / Details: LEFT REVERSE SHOULDER ARTHROPLASTY (Left) LUE: Unable to fully assess due to pain   Lower Extremity Assessment Lower Extremity Assessment: Overall WFL for tasks assessed   Cervical / Trunk Assessment Cervical / Trunk Assessment: Normal   Communication Communication Communication: No difficulties;Other (comment) (hearing aid)   Cognition Arousal/Alertness: Awake/alert Behavior During Therapy: WFL for tasks assessed/performed Overall Cognitive Status: Within Functional Limits for tasks assessed                     General Comments       Exercises       Shoulder Instructions  Home Living Family/patient expects to be discharged to:: Private residence Living Arrangements: Spouse/significant other Available Help at Discharge: Family;Available 24 hours/day Type of Home: Apartment Home Access: Stairs to enter Entrance  Stairs-Number of Steps: 1   Home Layout: Two level;Able to live on main level with bedroom/bathroom     Bathroom Shower/Tub: Walk-in shower         Home Equipment: Shower seat - built in;Grab bars - tub/shower          Prior Functioning/Environment Level of Independence: Independent                 OT Problem List: Decreased knowledge of precautions;Impaired UE functional use;Pain;Decreased knowledge of use of DME or AE   OT Treatment/Interventions:      OT Goals(Current goals can be found in the care plan section) Acute Rehab OT Goals Patient Stated Goal: home toda, back to biking  OT Frequency:     Barriers to D/C:            Co-evaluation              End of Session Equipment Utilized During Treatment: Other (comment) (sling) Nurse Communication: Patient requests pain meds  Activity Tolerance: Patient limited by pain;Patient tolerated treatment well Patient left: in chair;with call bell/phone within reach;with family/visitor present   Time: [id number] OT Time Calculation (min): 40 min Charges:  OT General Charges $OT Visit: 1 Procedure OT Evaluation $OT Eval Low Complexity: 1 Procedure OT Treatments $Self Care/Home Management : 8-22 mins G-Codes:    Hortencia Pilar 24-Jul-2016, 10:52 AM

## 2016-07-06 ENCOUNTER — Encounter (HOSPITAL_COMMUNITY): Payer: Self-pay | Admitting: Orthopedic Surgery

## 2016-07-07 NOTE — Discharge Summary (Signed)
Physician Discharge Summary   Patient ID: [name] MRN: [id number] DOB/AGE: Feb 16, 1941 75 y.o.  Admit date: 07/04/2016 Discharge date: 07/05/16  Admission Diagnoses:  Active Problems:   S/P shoulder replacement, left   Discharge Diagnoses:  Same   Surgeries: Procedure(s): LEFT REVERSE SHOULDER ARTHROPLASTY on 07/04/2016   Consultants: PT/OT Discharged Condition: Stable  Hospital Course: [name] is an 75 y.o. male who was admitted 07/04/2016 with a chief complaint of left shoulder pain, and found to have a diagnosis of left shoulder cuff arthropathy.  They were brought to the operating room on 07/04/2016 and underwent the above named procedures.    The patient had an uncomplicated hospital course and was stable for discharge.  Recent vital signs:  Vitals:   07/05/16 0407 07/05/16 0908  BP: (!) 101/54 (!) 102/57  Pulse: 71 85  Resp: 16 16  Temp: 98.9 F (37.2 C) 99 F (37.2 C)    Recent laboratory studies:  Results for orders placed or performed during the hospital encounter of 07/04/16  Hemoglobin and hematocrit, blood  Result Value Ref Range   Hemoglobin 10.3 (L) 13.0 - 17.0 g/dL   HCT 32.4 (L) 39.0 - [id number] %  Basic metabolic panel  Result Value Ref Range   Sodium 138 135 - 145 mmol/L   Potassium 4.0 3.5 - 5.1 mmol/L   Chloride 103 101 - 111 mmol/L   CO2 27 22 - 32 mmol/L   Glucose, Bld 118 (H) 65 - 99 mg/dL   BUN 11 6 - 20 mg/dL   Creatinine, Ser 0.71 0.61 - 1.24 mg/dL   Calcium 8.7 (L) 8.9 - 10.3 mg/dL   GFR calc non Af Amer >60 >60 mL/min   GFR calc Af Amer >60 >60 mL/min   Anion gap 8 5 - 15    Discharge Medications:     Medication List    STOP taking these medications   ferrous gluconate 324 MG tablet Commonly known as:  FERGON     TAKE these medications   aspirin 81 MG chewable tablet Chew 81 mg by mouth at bedtime.   DULoxetine 60 MG capsule Commonly known as:  CYMBALTA TAKE 1 CAPSULE (60 MG TOTAL) BY MOUTH DAILY.   FERREX 150 150 MG capsule Generic drug:  iron polysaccharides Take 1 tablet by mouth daily.   finasteride 5 MG tablet Commonly known as:  PROSCAR TAKE 1 TABLET (5 MG TOTAL) BY MOUTH DAILY.   folic acid A999333 MCG tablet Commonly known as:  V-R FOLIC ACID Take 1 tablet (400 mcg total) by mouth daily.   furosemide 20 MG tablet Commonly known as:  LASIX Take 1-2 tablets (20-40 mg total) by mouth daily as needed for edema.   methocarbamol 500 MG tablet Commonly known as:  ROBAXIN Take 1 tablet (500 mg total) by mouth 3 (three) times daily as needed.   naproxen sodium 220 MG tablet Commonly known as:  ANAPROX Take 220-440 mg by mouth daily. Depends on pain level if takes 1 or 2 tablets   nitroGLYCERIN 0.4 MG SL tablet Commonly known as:  NITROSTAT Place 1 tablet (0.4 mg total) under the tongue every 5 (five) minutes as needed for chest pain.   oxyCODONE-acetaminophen 5-325 MG tablet Commonly known as:  ROXICET Take 1-2 tablets by mouth every 4 (four) hours as needed for severe pain.   ranitidine 150 MG tablet Commonly known as:  ZANTAC Take 1 tablet (150 mg total) by mouth at bedtime.   simvastatin 40 MG  tablet Commonly known as:  ZOCOR TAKE 1 TABLET (40 MG TOTAL) BY MOUTH AT BEDTIME.   tamsulosin 0.4 MG Caps capsule Commonly known as:  FLOMAX Take 2 capsules (0.8 mg total) by mouth daily.   traMADol 50 MG tablet Commonly known as:  ULTRAM Take 1-2 tablets (50-100 mg total) by mouth 2 (two) times daily as needed for severe pain. What changed:  additional instructions   Vitamin B-12 1000 MCG Subl Place 1 tablet (1,000 mcg total) under the tongue daily.   vitamin C 500 MG tablet Commonly known as:  ASCORBIC ACID Take 1 tablet (500 mg total) by mouth daily.   VITAMIN D (ERGOCALCIFEROL) PO Take 1 tablet by mouth daily.       Diagnostic Studies: Dg Shoulder Left Port  Result Date: 07/04/2016 CLINICAL DATA:  75 year old male with a history of shoulder surgery  EXAM: LEFT SHOULDER - 1 VIEW COMPARISON:  Chest x-ray 08/14/2015 FINDINGS: Surgical changes of left shoulder reverse arthroplasty, with alignment of the components. No fracture identified. IMPRESSION: Single image of the left shoulder demonstrating reverse arthroplasty with no complicating features. Signed, Dulcy Fanny. Earleen Newport, DO [facility] Radiology Electronically Signed   By: Corrie Mckusick D.O.   On: 07/04/2016 15:49    Disposition: 01-Home or Self Care    Follow-up Information    Call Augustin Schooling, MD.   Specialty:  Orthopedic Surgery Why:  [phone] Contact information: [address] [place] [address] [phone]            Signed: Ventura Bruns 07/07/2016, 6:53 PM

## 2016-07-22 DIAGNOSIS — S46012D Strain of muscle(s) and tendon(s) of the rotator cuff of left shoulder, subsequent encounter: Secondary | ICD-10-CM | POA: Diagnosis not present

## 2016-07-22 DIAGNOSIS — M19012 Primary osteoarthritis, left shoulder: Secondary | ICD-10-CM | POA: Diagnosis not present

## 2016-07-24 ENCOUNTER — Encounter: Payer: Self-pay | Admitting: Internal Medicine

## 2016-07-24 ENCOUNTER — Ambulatory Visit (INDEPENDENT_AMBULATORY_CARE_PROVIDER_SITE_OTHER): Payer: Medicare Other | Admitting: Internal Medicine

## 2016-07-24 DIAGNOSIS — Z96612 Presence of left artificial shoulder joint: Secondary | ICD-10-CM

## 2016-07-24 DIAGNOSIS — R0789 Other chest pain: Secondary | ICD-10-CM

## 2016-07-24 DIAGNOSIS — I10 Essential (primary) hypertension: Secondary | ICD-10-CM

## 2016-07-24 DIAGNOSIS — M79622 Pain in left upper arm: Secondary | ICD-10-CM

## 2016-07-24 DIAGNOSIS — M5440 Lumbago with sciatica, unspecified side: Secondary | ICD-10-CM

## 2016-07-24 DIAGNOSIS — E538 Deficiency of other specified B group vitamins: Secondary | ICD-10-CM | POA: Diagnosis not present

## 2016-07-24 DIAGNOSIS — I2510 Atherosclerotic heart disease of native coronary artery without angina pectoris: Secondary | ICD-10-CM | POA: Diagnosis not present

## 2016-07-24 NOTE — Patient Instructions (Signed)
Please hold simvastatin for 2-3 weeks and use CoQ10

## 2016-07-24 NOTE — Assessment & Plan Note (Signed)
BP Readings from Last 3 Encounters:  07/24/16 118/82  07/05/16 (!) 102/57  07/01/16 117/88

## 2016-07-24 NOTE — Assessment & Plan Note (Signed)
On B12 

## 2016-07-24 NOTE — Progress Notes (Signed)
Pre visit review using our clinic review tool, if applicable. No additional management support is needed unless otherwise documented below in the visit note. 

## 2016-07-24 NOTE — Assessment & Plan Note (Signed)
Tramadol prn ° Potential benefits of a long term opioids use as well as potential risks (i.e. addiction risk, apnea etc) and complications (i.e. Somnolence, constipation and others) were explained to the patient and were aknowledged. ° ° °

## 2016-07-24 NOTE — Assessment & Plan Note (Signed)
Weakness: hold simvastatin and use CoQ10

## 2016-07-24 NOTE — Progress Notes (Signed)
Subjective:  Patient ID: [name], male    DOB: May 10, 1941  Age: 75 y.o. MRN: [id number]  CC: No chief complaint on file.   HPI Natan Coole Scialdone presents for CAD, dyslipidemia, shoulder pain. C/o leg weakness - worse  He is s/p reverse shoulder replacement - Dr Veverly Fells 111/17  Outpatient Medications Prior to Visit  Medication Sig Dispense Refill  . aspirin 81 MG chewable tablet Chew 81 mg by mouth at bedtime.    . Cyanocobalamin (VITAMIN B-12) 1000 MCG SUBL Place 1 tablet (1,000 mcg total) under the tongue daily. 100 tablet 3  . DULoxetine (CYMBALTA) 60 MG capsule TAKE 1 CAPSULE (60 MG TOTAL) BY MOUTH DAILY. 90 capsule 0  . FERREX 150 150 MG capsule Take 1 tablet by mouth daily.  3  . finasteride (PROSCAR) 5 MG tablet TAKE 1 TABLET (5 MG TOTAL) BY MOUTH DAILY. 90 tablet 1  . folic acid (V-R FOLIC ACID) A999333 MCG tablet Take 1 tablet (400 mcg total) by mouth daily. 90 tablet 3  . furosemide (LASIX) 20 MG tablet Take 1-2 tablets (20-40 mg total) by mouth daily as needed for edema. 180 tablet 2  . methocarbamol (ROBAXIN) 500 MG tablet Take 1 tablet (500 mg total) by mouth 3 (three) times daily as needed. 60 tablet 1  . naproxen sodium (ANAPROX) 220 MG tablet Take 220-440 mg by mouth daily. Depends on pain level if takes 1 or 2 tablets    . nitroGLYCERIN (NITROSTAT) 0.4 MG SL tablet Place 1 tablet (0.4 mg total) under the tongue every 5 (five) minutes as needed for chest pain. 20 tablet 3  . oxyCODONE-acetaminophen (ROXICET) 5-325 MG tablet Take 1-2 tablets by mouth every 4 (four) hours as needed for severe pain. 60 tablet 0  . ranitidine (ZANTAC) 150 MG tablet Take 1 tablet (150 mg total) by mouth at bedtime. 90 tablet 3  . simvastatin (ZOCOR) 40 MG tablet TAKE 1 TABLET (40 MG TOTAL) BY MOUTH AT BEDTIME. 90 tablet 2  . tamsulosin (FLOMAX) 0.4 MG CAPS capsule Take 2 capsules (0.8 mg total) by mouth daily. 30 capsule 0  . traMADol (ULTRAM) 50 MG tablet Take 1-2 tablets (50-100 mg  total) by mouth 2 (two) times daily as needed for severe pain. (Patient taking differently: Take 50-100 mg by mouth 2 (two) times daily as needed for severe pain. Depends on pain level if takes 1 or 2 tablets) 120 tablet 0  . vitamin C (ASCORBIC ACID) 500 MG tablet Take 1 tablet (500 mg total) by mouth daily. 100 tablet 1  . VITAMIN D, ERGOCALCIFEROL, PO Take 1 tablet by mouth daily.     No facility-administered medications prior to visit.     ROS Review of Systems  Constitutional: Negative for appetite change, fatigue and unexpected weight change.  HENT: Negative for congestion, nosebleeds, sneezing, sore throat and trouble swallowing.   Eyes: Negative for itching and visual disturbance.  Respiratory: Negative for cough.   Cardiovascular: Negative for chest pain, palpitations and leg swelling.  Gastrointestinal: Negative for abdominal distention, blood in stool, diarrhea and nausea.  Genitourinary: Negative for frequency and hematuria.  Musculoskeletal: Positive for arthralgias and back pain. Negative for gait problem, joint swelling and neck pain.  Skin: Negative for rash.  Neurological: Negative for dizziness, tremors, speech difficulty and weakness.  Psychiatric/Behavioral: Negative for agitation, dysphoric mood and sleep disturbance. The patient is not nervous/anxious.     Objective:  BP 118/82   Pulse 76   Temp 97.7  F (36.5 C)   Wt 231 lb (104.8 kg)   SpO2 98%   BMI 31.33 kg/m   BP Readings from Last 3 Encounters:  07/24/16 118/82  07/05/16 (!) 102/57  07/01/16 117/88    Wt Readings from Last 3 Encounters:  07/24/16 231 lb (104.8 kg)  07/04/16 238 lb 8 oz (108.2 kg)  07/01/16 238 lb 8 oz (108.2 kg)    Physical Exam  Constitutional: He is oriented to person, place, and time. He appears well-developed. No distress.  NAD  HENT:  Mouth/Throat: Oropharynx is clear and moist.  Eyes: Conjunctivae are normal. Pupils are equal, round, and reactive to light.  Neck:  Normal range of motion. No JVD present. No thyromegaly present.  Cardiovascular: Normal rate, regular rhythm, normal heart sounds and intact distal pulses.  Exam reveals no gallop and no friction rub.   No murmur heard. Pulmonary/Chest: Effort normal and breath sounds normal. No respiratory distress. He has no wheezes. He has no rales. He exhibits no tenderness.  Abdominal: Soft. Bowel sounds are normal. He exhibits no distension and no mass. There is no tenderness. There is no rebound and no guarding.  Musculoskeletal: Normal range of motion. He exhibits tenderness. He exhibits no edema.  Lymphadenopathy:    He has no cervical adenopathy.  Neurological: He is alert and oriented to person, place, and time. He has normal reflexes. No cranial nerve deficit. He exhibits normal muscle tone. He displays a negative Romberg sign. Coordination and gait normal.  Skin: Skin is warm and dry. No rash noted.  Psychiatric: He has a normal mood and affect. His behavior is normal. Judgment and thought content normal.  L shoulder is tender  Lab Results  Component Value Date   WBC 4.9 07/01/2016   HGB 10.3 (L) 07/05/2016   HCT 32.4 (L) 07/05/2016   PLT 207 07/01/2016   GLUCOSE 118 (H) 07/05/2016   CHOL 139 02/22/2016   TRIG 74.0 02/22/2016   HDL 51.70 02/22/2016   LDLDIRECT 157.5 07/05/2008   LDLCALC 73 02/22/2016   ALT 14 08/13/2015   AST 17 08/13/2015   NA 138 07/05/2016   K 4.0 07/05/2016   CL 103 07/05/2016   CREATININE 0.71 07/05/2016   BUN 11 07/05/2016   CO2 27 07/05/2016   TSH 0.831 08/13/2015   PSA 0.49 03/01/2012   INR 1.06 08/13/2015    No results found.  Assessment & Plan:   Diagnoses and all orders for this visit:  S/P shoulder replacement, left  Atherosclerosis of native coronary artery of native heart without angina pectoris  Essential hypertension  B12 deficiency  Bilateral low back pain with sciatica, sciatica laterality unspecified, unspecified chronicity   I am  having Mr. Plott maintain his Vitamin 0000000, aspirin, folic acid, nitroGLYCERIN, vitamin C, simvastatin, furosemide, ranitidine, traMADol, DULoxetine, finasteride, tamsulosin, FERREX 150, naproxen sodium, (VITAMIN D, ERGOCALCIFEROL, PO), oxyCODONE-acetaminophen, and methocarbamol.  No orders of the defined types were placed in this encounter.    Follow-up: No Follow-up on file.  [name], MD

## 2016-07-24 NOTE — Assessment & Plan Note (Signed)
ASA, simvastatin

## 2016-07-24 NOTE — Assessment & Plan Note (Signed)
S/p reverse shoulder replacement - Dr Veverly Fells 111/17

## 2016-07-24 NOTE — Assessment & Plan Note (Signed)
Resolved

## 2016-07-25 DIAGNOSIS — H903 Sensorineural hearing loss, bilateral: Secondary | ICD-10-CM | POA: Diagnosis not present

## 2016-08-01 ENCOUNTER — Other Ambulatory Visit: Payer: Self-pay | Admitting: *Deleted

## 2016-08-01 NOTE — Telephone Encounter (Signed)
Ok to Rf Tramadol? Fax rx to [phone].

## 2016-08-04 NOTE — Telephone Encounter (Signed)
Rec'd call from [facility] requesting status on pt Tramadol. Is this ok to refill...[name]

## 2016-08-06 MED ORDER — TRAMADOL HCL 50 MG PO TABS: 50.0000 mg | ORAL_TABLET | Freq: Two times a day (BID) | ORAL | 3 refills | Status: DC | PRN

## 2016-08-07 MED ORDER — TRAMADOL HCL 50 MG PO TABS: 50.0000 mg | ORAL_TABLET | Freq: Two times a day (BID) | ORAL | 3 refills | Status: DC | PRN

## 2016-08-07 NOTE — Telephone Encounter (Signed)
Re-printed script faxed to [facility]...Johny Chess

## 2016-08-07 NOTE — Addendum Note (Signed)
Addended by: Earnstine Regal on: 08/07/2016 09:54 AM   Modules accepted: Orders

## 2016-08-28 DIAGNOSIS — H52223 Regular astigmatism, bilateral: Secondary | ICD-10-CM | POA: Diagnosis not present

## 2016-08-28 DIAGNOSIS — H04123 Dry eye syndrome of bilateral lacrimal glands: Secondary | ICD-10-CM | POA: Diagnosis not present

## 2016-08-28 DIAGNOSIS — H26492 Other secondary cataract, left eye: Secondary | ICD-10-CM | POA: Diagnosis not present

## 2016-08-28 DIAGNOSIS — H5203 Hypermetropia, bilateral: Secondary | ICD-10-CM | POA: Diagnosis not present

## 2016-09-02 DIAGNOSIS — Z96612 Presence of left artificial shoulder joint: Secondary | ICD-10-CM | POA: Diagnosis not present

## 2016-09-17 ENCOUNTER — Other Ambulatory Visit: Payer: Self-pay | Admitting: Internal Medicine

## 2016-09-17 ENCOUNTER — Other Ambulatory Visit (INDEPENDENT_AMBULATORY_CARE_PROVIDER_SITE_OTHER): Payer: Medicare Other

## 2016-09-17 DIAGNOSIS — I10 Essential (primary) hypertension: Secondary | ICD-10-CM | POA: Diagnosis not present

## 2016-09-17 DIAGNOSIS — D509 Iron deficiency anemia, unspecified: Secondary | ICD-10-CM | POA: Diagnosis not present

## 2016-09-17 LAB — BASIC METABOLIC PANEL
BUN: 13 mg/dL (ref 6–23)
CO2: 32 mEq/L (ref 19–32)
Calcium: 9.4 mg/dL (ref 8.4–10.5)
Chloride: 106 mEq/L (ref 96–112)
Creatinine, Ser: 0.83 mg/dL (ref 0.40–1.50)
GFR: 95.86 mL/min (ref >60.00)
Glucose, Bld: 111 mg/dL — ABNORMAL HIGH (ref 70–99)
Potassium: 4.4 mEq/L (ref 3.5–5.1)
Sodium: 142 mEq/L (ref 135–145)

## 2016-09-17 LAB — CBC WITH DIFFERENTIAL/PLATELET
Basophils Absolute: 0 10*3/uL (ref 0.0–0.1)
Basophils Relative: 0.7 % (ref 0.0–3.0)
Eosinophils Absolute: 0.2 10*3/uL (ref 0.0–0.7)
Eosinophils Relative: 3.6 % (ref 0.0–5.0)
HCT: 38.7 % — ABNORMAL LOW (ref 39.0–52.0)
Hemoglobin: 12.8 g/dL — ABNORMAL LOW (ref 13.0–17.0)
Lymphocytes Relative: 20.4 % (ref 12.0–46.0)
Lymphs Abs: 1.1 10*3/uL (ref 0.7–4.0)
MCHC: 33.1 g/dL (ref 30.0–36.0)
MCV: 92.6 fl (ref 78.0–100.0)
Monocytes Absolute: 0.6 10*3/uL (ref 0.1–1.0)
Monocytes Relative: 11 % (ref 3.0–12.0)
Neutro Abs: 3.5 10*3/uL (ref 1.4–7.7)
Neutrophils Relative %: 64.3 % (ref 43.0–77.0)
Platelets: 194 10*3/uL (ref 150.0–400.0)
RBC: 4.18 Mil/uL — ABNORMAL LOW (ref 4.22–5.81)
RDW: 13.1 % (ref 11.5–15.5)
WBC: 5.4 10*3/uL (ref 4.0–10.5)

## 2016-09-17 LAB — IBC PANEL
Iron: 168 ug/dL — ABNORMAL HIGH (ref 42–165)
Saturation Ratios: 40.5 % (ref 20.0–50.0)
Transferrin: 296 mg/dL (ref 212.0–360.0)

## 2016-09-30 ENCOUNTER — Ambulatory Visit (INDEPENDENT_AMBULATORY_CARE_PROVIDER_SITE_OTHER): Payer: Medicare Other | Admitting: Internal Medicine

## 2016-09-30 ENCOUNTER — Encounter: Payer: Self-pay | Admitting: Internal Medicine

## 2016-09-30 DIAGNOSIS — E538 Deficiency of other specified B group vitamins: Secondary | ICD-10-CM | POA: Diagnosis not present

## 2016-09-30 DIAGNOSIS — G4733 Obstructive sleep apnea (adult) (pediatric): Secondary | ICD-10-CM | POA: Diagnosis not present

## 2016-09-30 DIAGNOSIS — E785 Hyperlipidemia, unspecified: Secondary | ICD-10-CM

## 2016-09-30 DIAGNOSIS — I2510 Atherosclerotic heart disease of native coronary artery without angina pectoris: Secondary | ICD-10-CM

## 2016-09-30 DIAGNOSIS — I10 Essential (primary) hypertension: Secondary | ICD-10-CM | POA: Diagnosis not present

## 2016-09-30 DIAGNOSIS — D500 Iron deficiency anemia secondary to blood loss (chronic): Secondary | ICD-10-CM

## 2016-09-30 NOTE — Assessment & Plan Note (Signed)
Simvastatin 

## 2016-09-30 NOTE — Assessment & Plan Note (Addendum)
May need IV iron. PO iron causes constipation. Hold po iron

## 2016-09-30 NOTE — Progress Notes (Signed)
Pre visit review using our clinic review tool, if applicable. No additional management support is needed unless otherwise documented below in the visit note. 

## 2016-09-30 NOTE — Assessment & Plan Note (Signed)
Off Rx x 10 years. Worse Will ref to Dr Brett Fairy. He needs a new sleep test and management

## 2016-09-30 NOTE — Assessment & Plan Note (Signed)
BP Readings from Last 3 Encounters:  09/30/16 138/78  07/24/16 118/82  07/05/16 (!) 102/57

## 2016-09-30 NOTE — Assessment & Plan Note (Signed)
ASA Simvastatin 

## 2016-09-30 NOTE — Assessment & Plan Note (Signed)
On B12 

## 2016-09-30 NOTE — Progress Notes (Signed)
Subjective:  Patient ID: [name], male    DOB: [birth year]  Age: 76 y.o. MRN: [id number]  CC: No chief complaint on file.   HPI Sherard Scattergood Zuniga presents for OSA - worse snorring  - not using a mask >10 years F/u anemia, OA.  Outpatient Medications Prior to Visit  Medication Sig Dispense Refill  . aspirin 81 MG chewable tablet Chew 81 mg by mouth at bedtime.    . Cyanocobalamin (VITAMIN B-12) 1000 MCG SUBL Place 1 tablet (1,000 mcg total) under the tongue daily. 100 tablet 3  . DULoxetine (CYMBALTA) 60 MG capsule TAKE 1 CAPSULE (60 MG TOTAL) BY MOUTH DAILY. 90 capsule 0  . FERREX 150 150 MG capsule Take 1 tablet by mouth daily.  3  . finasteride (PROSCAR) 5 MG tablet TAKE 1 TABLET (5 MG TOTAL) BY MOUTH DAILY. 90 tablet 1  . folic acid (V-R FOLIC ACID) A999333 MCG tablet Take 1 tablet (400 mcg total) by mouth daily. 90 tablet 3  . furosemide (LASIX) 20 MG tablet TAKE 1 TO 2 TABLETS BY  MOUTH DAILY AS NEEDED FOR  EDEMA 180 tablet 3  . methocarbamol (ROBAXIN) 500 MG tablet Take 1 tablet (500 mg total) by mouth 3 (three) times daily as needed. 60 tablet 1  . naproxen sodium (ANAPROX) 220 MG tablet Take 220-440 mg by mouth daily. Depends on pain level if takes 1 or 2 tablets    . nitroGLYCERIN (NITROSTAT) 0.4 MG SL tablet Place 1 tablet (0.4 mg total) under the tongue every 5 (five) minutes as needed for chest pain. 20 tablet 3  . oxyCODONE-acetaminophen (ROXICET) 5-325 MG tablet Take 1-2 tablets by mouth every 4 (four) hours as needed for severe pain. 60 tablet 0  . ranitidine (ZANTAC) 150 MG tablet Take 1 tablet (150 mg total) by mouth at bedtime. 90 tablet 3  . simvastatin (ZOCOR) 40 MG tablet TAKE 1 TABLET BY MOUTH AT  BEDTIME 90 tablet 3  . tamsulosin (FLOMAX) 0.4 MG CAPS capsule Take 2 capsules (0.8 mg total) by mouth daily. 30 capsule 0  . tamsulosin (FLOMAX) 0.4 MG CAPS capsule TAKE 1 CAPSULE BY MOUTH  DAILY 90 capsule 3  . traMADol (ULTRAM) 50 MG tablet Take 1-2 tablets  (50-100 mg total) by mouth 2 (two) times daily as needed for severe pain. 120 tablet 3  . vitamin C (ASCORBIC ACID) 500 MG tablet Take 1 tablet (500 mg total) by mouth daily. 100 tablet 1  . VITAMIN D, ERGOCALCIFEROL, PO Take 1 tablet by mouth daily.     No facility-administered medications prior to visit.     ROS Review of Systems  Objective:  BP 138/78   Pulse 80   Resp 20   Wt 242 lb 8 oz (110 kg)   SpO2 93%   BMI 32.89 kg/m   BP Readings from Last 3 Encounters:  09/30/16 138/78  07/24/16 118/82  07/05/16 (!) 102/57    Wt Readings from Last 3 Encounters:  09/30/16 242 lb 8 oz (110 kg)  07/24/16 231 lb (104.8 kg)  07/04/16 238 lb 8 oz (108.2 kg)    Physical Exam  Lab Results  Component Value Date   WBC 5.4 09/17/2016   HGB 12.8 (L) 09/17/2016   HCT 38.7 (L) 09/17/2016   PLT 194.0 09/17/2016   GLUCOSE 111 (H) 09/17/2016   CHOL 139 02/22/2016   TRIG 74.0 02/22/2016   HDL 51.70 02/22/2016   LDLDIRECT 157.5 07/05/2008   LDLCALC 73 02/22/2016  ALT 14 08/13/2015   AST 17 08/13/2015   NA 142 09/17/2016   K 4.4 09/17/2016   CL 106 09/17/2016   CREATININE 0.83 09/17/2016   BUN 13 09/17/2016   CO2 32 09/17/2016   TSH 0.831 08/13/2015   PSA 0.49 03/01/2012   INR 1.06 08/13/2015    No results found.  Assessment & Plan:   There are no diagnoses linked to this encounter. I am having Mr. Ascher maintain his Vitamin 0000000, aspirin, folic acid, nitroGLYCERIN, vitamin C, ranitidine, DULoxetine, finasteride, tamsulosin, FERREX 150, naproxen sodium, (VITAMIN D, ERGOCALCIFEROL, PO), oxyCODONE-acetaminophen, methocarbamol, traMADol, furosemide, tamsulosin, and simvastatin.  No orders of the defined types were placed in this encounter.    Follow-up: No Follow-up on file.  [name], MD

## 2016-10-01 ENCOUNTER — Other Ambulatory Visit: Payer: Self-pay

## 2016-10-01 DIAGNOSIS — J208 Acute bronchitis due to other specified organisms: Secondary | ICD-10-CM | POA: Diagnosis not present

## 2016-10-01 MED ORDER — RANITIDINE HCL 150 MG PO TABS: 150.0000 mg | ORAL_TABLET | Freq: Every day | ORAL | 3 refills | Status: DC

## 2016-10-01 NOTE — Progress Notes (Signed)
Refill sent to pharmacy.   

## 2016-10-07 ENCOUNTER — Other Ambulatory Visit: Payer: Self-pay

## 2016-10-07 MED ORDER — TAMSULOSIN HCL 0.4 MG PO CAPS: 0.8000 mg | ORAL_CAPSULE | Freq: Every day | ORAL | 2 refills | Status: DC

## 2016-10-08 ENCOUNTER — Telehealth: Payer: Self-pay | Admitting: Internal Medicine

## 2016-10-08 NOTE — Telephone Encounter (Signed)
Spoke to patient regarding awv. Patient stated he would like schedule an awv at a later date.

## 2016-10-11 ENCOUNTER — Other Ambulatory Visit: Payer: Self-pay | Admitting: Family

## 2016-10-13 ENCOUNTER — Other Ambulatory Visit: Payer: Self-pay | Admitting: *Deleted

## 2016-10-13 MED ORDER — RANITIDINE HCL 150 MG PO TABS: 150.0000 mg | ORAL_TABLET | Freq: Every day | ORAL | 3 refills | Status: DC

## 2016-10-14 ENCOUNTER — Telehealth: Payer: Self-pay

## 2016-10-14 MED ORDER — RANITIDINE HCL 150 MG PO TABS: 150.0000 mg | ORAL_TABLET | Freq: Two times a day (BID) | ORAL | 3 refills | Status: DC

## 2016-10-14 NOTE — Telephone Encounter (Signed)
Please verify regimen of Ranitidine 150 mg tab Per OV on 09/30/2016 patient is taking one tablet at bedtime Per request from pharmacy, patient stated he is taking BID Last refilled 10/13/2016  Please advise... [facility]

## 2016-10-14 NOTE — Telephone Encounter (Signed)
Corrected - bid Thx

## 2016-10-16 ENCOUNTER — Other Ambulatory Visit: Payer: Self-pay

## 2016-10-16 MED ORDER — TAMSULOSIN HCL 0.4 MG PO CAPS: 0.4000 mg | ORAL_CAPSULE | Freq: Every day | ORAL | 2 refills | Status: DC

## 2016-10-20 ENCOUNTER — Telehealth: Payer: Self-pay | Admitting: *Deleted

## 2016-10-20 NOTE — Telephone Encounter (Signed)
Called pt to verify if he's taking 1 or 2 capsules. Pt states he is taking 2 capsule a day. He stated Dr. Alain Marion told him if he neededtoo he can take 2 capsule and its working. He also stated at this time he does not need the refill from [facility] he received order from [facility] already. Inform pt will update med list w/2 capsule a day for future refills...[name]

## 2016-10-20 NOTE — Telephone Encounter (Signed)
Does he need 2 a day? Thx

## 2016-10-20 NOTE — Telephone Encounter (Signed)
Rec'd fax stating pt states he is taking 2 tamulosin a day. Per med list it states only 1. Pls advise if ok to increase to 2 pills a day...[name]

## 2016-10-21 ENCOUNTER — Other Ambulatory Visit: Payer: Self-pay

## 2016-10-21 MED ORDER — TAMSULOSIN HCL 0.4 MG PO CAPS: 0.8000 mg | ORAL_CAPSULE | Freq: Every day | ORAL | 2 refills | Status: DC

## 2016-10-23 ENCOUNTER — Ambulatory Visit: Payer: Medicare Other | Admitting: Internal Medicine

## 2016-10-24 ENCOUNTER — Other Ambulatory Visit (INDEPENDENT_AMBULATORY_CARE_PROVIDER_SITE_OTHER): Payer: Medicare Other

## 2016-10-24 DIAGNOSIS — I10 Essential (primary) hypertension: Secondary | ICD-10-CM

## 2016-10-24 DIAGNOSIS — D509 Iron deficiency anemia, unspecified: Secondary | ICD-10-CM

## 2016-10-24 LAB — CBC WITH DIFFERENTIAL/PLATELET
Basophils Absolute: 0.1 10*3/uL (ref 0.0–0.1)
Basophils Relative: 1 % (ref 0.0–3.0)
Eosinophils Absolute: 0.2 10*3/uL (ref 0.0–0.7)
Eosinophils Relative: 4.4 % (ref 0.0–5.0)
HCT: 37.2 % — ABNORMAL LOW (ref 39.0–52.0)
Hemoglobin: 12.5 g/dL — ABNORMAL LOW (ref 13.0–17.0)
Lymphocytes Relative: 26.4 % (ref 12.0–46.0)
Lymphs Abs: 1.4 10*3/uL (ref 0.7–4.0)
MCHC: 33.5 g/dL (ref 30.0–36.0)
MCV: 90.8 fl (ref 78.0–100.0)
Monocytes Absolute: 1.1 10*3/uL — ABNORMAL HIGH (ref 0.1–1.0)
Monocytes Relative: 19.3 % — ABNORMAL HIGH (ref 3.0–12.0)
Neutro Abs: 2.7 10*3/uL (ref 1.4–7.7)
Neutrophils Relative %: 48.9 % (ref 43.0–77.0)
Platelets: 250 10*3/uL (ref 150.0–400.0)
RBC: 4.1 Mil/uL — ABNORMAL LOW (ref 4.22–5.81)
RDW: 13.5 % (ref 11.5–15.5)
WBC: 5.5 10*3/uL (ref 4.0–10.5)

## 2016-10-24 LAB — BASIC METABOLIC PANEL
BUN: 15 mg/dL (ref 6–23)
CO2: 32 mEq/L (ref 19–32)
Calcium: 9.8 mg/dL (ref 8.4–10.5)
Chloride: 100 mEq/L (ref 96–112)
Creatinine, Ser: 0.75 mg/dL (ref 0.40–1.50)
GFR: 107.73 mL/min (ref >60.00)
Glucose, Bld: 89 mg/dL (ref 70–99)
Potassium: 4.4 mEq/L (ref 3.5–5.1)
Sodium: 138 mEq/L (ref 135–145)

## 2016-10-24 LAB — IBC PANEL
Iron: 34 ug/dL — ABNORMAL LOW (ref 42–165)
Saturation Ratios: 7.1 % — ABNORMAL LOW (ref 20.0–50.0)
Transferrin: 342 mg/dL (ref 212.0–360.0)

## 2016-10-30 ENCOUNTER — Ambulatory Visit (INDEPENDENT_AMBULATORY_CARE_PROVIDER_SITE_OTHER): Payer: Medicare Other | Admitting: Neurology

## 2016-10-30 ENCOUNTER — Encounter: Payer: Self-pay | Admitting: Neurology

## 2016-10-30 VITALS — BP 128/71 | HR 69 | Resp 20 | Ht 72.0 in | Wt 240.0 lb

## 2016-10-30 DIAGNOSIS — G4733 Obstructive sleep apnea (adult) (pediatric): Secondary | ICD-10-CM

## 2016-10-30 DIAGNOSIS — E6609 Other obesity due to excess calories: Secondary | ICD-10-CM

## 2016-10-30 NOTE — Patient Instructions (Signed)

## 2016-10-30 NOTE — Progress Notes (Signed)
SLEEP MEDICINE CLINIC   Provider:  Larey [name], M D  Referring Provider: Cassandria Anger, MD Primary Care Physician:  Lee Kehr, MD  Chief Complaint  Patient presents with  . New Patient (Initial Visit)    has tried cpap before but did not tolerate it    HPI:  [name] is a 76 y.o. male , seen here as a referral  from [name] for a sleep consultation,  [name] is a 76 year old, right handed, caucasian married male patient, with a history of OSA. His original sleep study was performed at [facility], on [facility] in 2000  and revealed " severe" Obstructive Sleep Apnea. He has not been able To use CPAP, but it was not that he could not tolerate the CPAP- the durable medical equipment company furnished machine went out of business was in a month or so after his machine was issued  and he could never get supplies again. Their office was on [address] he recalls. 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 He is surprised to learn that there are nasal pillows, nasal masks etc. and how small and quiet today CPAP machines. I also explained that many CPAP machines can be used as BiPAP as well.  [name] past medical history includes spot related injuries making it necessary to undergo a total knee replacement in the year 2002, he had 3 lumbar fusions, a tonsillectomy in childhood, he has been diagnosed in the past with iron deficiency anemia, has been treated for chronic pain with narcotic medications. 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  Chief complaint according to patient : " I snore, I stop breathing, my wife is concerned about my erratic breathing"   Sleep habits are as follows:  He usually goes to bed to watch TV news -around 11.00 PM . And it will take him usually 15-30 minutes but  not longer to go to sleep, and usually falls asleep on his side. He does resume a supine position later during the night. He does snore and his wife reports that breathing is worse when supine position. He sleeps on only one pillow but elevates the head of bed with a wedge. His bedroom is described as cool, quiet and dark. Nocturia 3 or 4 at night. He wakes with hand and leg pain, cramping.  He rises at 8 AM, and feels " OK ", but craves more sleep. He wakes without headaches, but feels stiff.    Sleep medical history and family sleep history:  Tonsillectomy in childhood.   Social history: ex smoker , since 13, ETOH ,  Wine once a month , caffeine : coffee 4-6  cups, iced tea 1 glas at dinner, Soda - rarely.  No shift work history, worked as a Dealer.   Review of Systems: Out of a complete 14 system review, the patient complains of only the following symptoms, and all other reviewed systems are negative. Epworth score 12 , Fatigue severity score 35  , depression score 2/15    Social History   Social History  . Marital status: Married    Spouse name: N/A  . Number of children: N/A  . Years of education: N/A   Occupational History  . Retired Retired    Dealer   Social History  Main Topics  . Smoking status: Former Smoker    Quit date: 01/08/1987  . Smokeless tobacco: Never Used  . Alcohol use 0.0 oz/week     Comment: rare  . Drug use: No  . Sexual activity: Not Currently   Other Topics Concern  . Not on file   Social History Narrative   He lives with wife in a 2 story home.  Has 2 children.   Retired Dealer.   Highest level of education:  High school   Regular Exercise -  YES    Family History  Problem Relation Age of Onset  . Aneurysm Father     AAA  . Heart disease Father 25    CHF  . Alzheimer's disease Father   . Cancer Brother   . Alzheimer's disease Brother     dementia  . Stomach cancer Brother   . Cancer Mother 51    breast cancer  . Heart  disease Mother 92    MI  . Autoimmune disease Son   . CAD Brother   . Colon cancer Neg Hx   . Esophageal cancer Neg Hx   . Pancreatic cancer Neg Hx   . Prostate cancer Neg Hx   . Rectal cancer Neg Hx     Past Medical History:  Diagnosis Date  . Anemia   . Blood transfusion without reported diagnosis 02/23/2014   3 units for iron deficiency anemia. as a baby - whenhe had pneumonia  . BPH (benign prostatic hyperplasia)   . CTS (carpal tunnel syndrome)    better  . Dyspnea   . GERD (gastroesophageal reflux disease)   . Hyperlipidemia   . Insomnia   . LBP (low back pain)   . Osteoarthritis   . Pneumonia    as a baby  . Sleep apnea    no cpap    Past Surgical History:  Procedure Laterality Date  . CARDIAC CATHETERIZATION N/A 08/15/2015   Procedure: Left Heart Cath and Coronary Angiography;  Surgeon: Lorretta Harp, MD;  Location: [facility];  Service: Cardiovascular;  Laterality: N/A;  . CARPAL TUNNEL RELEASE Bilateral 1996, 2004   Dr Lenoard Aden thumb surgery.  more than once  . COLONOSCOPY  2005, 2012   diverticulosis.   Marland Kitchen ESOPHAGOGASTRODUODENOSCOPY N/A 02/23/2014   Procedure: ESOPHAGOGASTRODUODENOSCOPY (EGD);  Surgeon: Irene Shipper, MD;  Location: [facility];  Service: Endoscopy;  Laterality: N/A;  . EYE SURGERY  2012   both cataracts, Lasik  . LUMBAR FUSION     x 3  . REVERSE SHOULDER ARTHROPLASTY Left 07/04/2016   Procedure: LEFT REVERSE SHOULDER ARTHROPLASTY;  Surgeon: Netta Cedars, MD;  Location: [facility];  Service: Orthopedics;  Laterality: Left;  . TONSILLECTOMY  1946  . TOTAL KNEE ARTHROPLASTY  2003   Right    Current Outpatient Prescriptions  Medication Sig Dispense Refill  . aspirin 81 MG chewable tablet Chew 81 mg by mouth at bedtime.    . Cyanocobalamin (VITAMIN B-12) 1000 MCG SUBL Place 1 tablet (1,000 mcg total) under the tongue daily. 100 tablet 3  . DULoxetine (CYMBALTA) 60 MG capsule TAKE 1 CAPSULE (60 MG TOTAL) BY MOUTH DAILY. 90 capsule 0    . FERREX 150 150 MG capsule Take 1 tablet by mouth daily.  3  . finasteride (PROSCAR) 5 MG tablet TAKE 1 TABLET (5 MG TOTAL) BY MOUTH DAILY. 90 tablet 1  . folic acid (V-R FOLIC ACID) 433 MCG tablet Take 1 tablet (400 mcg total) by  mouth daily. 90 tablet 3  . furosemide (LASIX) 20 MG tablet TAKE 1 TO 2 TABLETS BY  MOUTH DAILY AS NEEDED FOR  EDEMA 180 tablet 3  . methocarbamol (ROBAXIN) 500 MG tablet Take 1 tablet (500 mg total) by mouth 3 (three) times daily as needed. 60 tablet 1  . naproxen sodium (ANAPROX) 220 MG tablet Take 220-440 mg by mouth daily. Depends on pain level if takes 1 or 2 tablets    . nitroGLYCERIN (NITROSTAT) 0.4 MG SL tablet Place 1 tablet (0.4 mg total) under the tongue every 5 (five) minutes as needed for chest pain. 20 tablet 3  . oxyCODONE-acetaminophen (ROXICET) 5-325 MG tablet Take 1-2 tablets by mouth every 4 (four) hours as needed for severe pain. 60 tablet 0  . ranitidine (ZANTAC) 150 MG tablet Take 1 tablet (150 mg total) by mouth 2 (two) times daily. 180 tablet 3  . simvastatin (ZOCOR) 40 MG tablet TAKE 1 TABLET BY MOUTH AT  BEDTIME 90 tablet 3  . tamsulosin (FLOMAX) 0.4 MG CAPS capsule Take 2 capsules (0.8 mg total) by mouth daily. 180 capsule 2  . traMADol (ULTRAM) 50 MG tablet Take 1-2 tablets (50-100 mg total) by mouth 2 (two) times daily as needed for severe pain. 120 tablet 3  . vitamin C (ASCORBIC ACID) 500 MG tablet Take 1 tablet (500 mg total) by mouth daily. 100 tablet 1  . VITAMIN D, ERGOCALCIFEROL, PO Take 1 tablet by mouth daily.     No current facility-administered medications for this visit.     Allergies as of 10/30/2016  . (No Known Allergies)    Vitals: BP 128/71   Pulse 69   Resp 20   Ht 6' (1.829 m)   Wt 240 lb (108.9 kg)   BMI 32.55 kg/m  Last Weight:  Wt Readings from Last 1 Encounters:  10/30/16 240 lb (108.9 kg)   ZDG:UYQI mass index is 32.55 kg/m.     Last Height:   Ht Readings from Last 1 Encounters:  10/30/16 6' (1.829  m)    Physical exam:  General: The patient is awake, alert and appears not in acute distress. The patient is well groomed. Head: Normocephalic, atraumatic. Neck is supple. Mallampati 3-4  ,  neck circumference:19. Nasal airflow patent , TMJ click evident . Retrognathia is not seen.  Cardiovascular:  Regular rate and rhythm , without  murmurs or carotid bruit, and without distended neck veins. Respiratory: Lungs are clear to auscultation. Skin:  Without evidence of edema, or rash Trunk: BMI is 33  Neurologic exam : The patient is awake and alert, oriented to place and time.   Memory subjective  described as intact.  Speech is fluent,  without dysarthria, dysphonia or aphasia.  Mood and affect are appropriate.  Cranial nerves: Pupils are equal and briskly reactive to light.  Funduscopic exam without evidence of pallor or edema. Extraocular movements  in vertical and horizontal planes intact and without nystagmus. Visual fields by finger perimetry are intact. Hearing to finger rub intact.  Facial sensation intact to fine touch. Facial motor strength is symmetric and tongue and uvula move midline. Shoulder shrug was symmetrical.   Motor exam: ROM- restricted in shoulder.  Normal tone, muscle bulk and symmetric strength in all extremities.  Sensory:  Fine touch, pinprick and vibration were tested in all extremities.   Coordination: Rapid alternating movements, Finger-to-nose maneuver  normal without evidence of ataxia, dysmetria or tremor.  Gait and station: Patient walks without assistive device  and is able unassisted to climb up to the exam table. Strength within normal limits.  Stance is stable and normal.   Deep tendon reflexes: in the  upper and lower extremities are symmetric and intact.  The patient was advised of the nature of the OSA= sleep disorder , the treatment options and risks for general a health and wellness arising from not treating the condition.  I spent more than 40  minutes of face to face time with the patient. Greater than 50% of time was spent in counseling and coordination of care. We have discussed the diagnosis and differential and I answered the patient's questions.     Assessment:  After physical and neurologic examination, review of laboratory studies,  Personal review of imaging studies, reports of other /same  Imaging studies ,  Results of polysomnography/ neurophysiology testing and pre-existing records as far as provided in visit., my assessment is   1) I believe that [name] very likely still has obstructive sleep apnea and that we just need to really determine the degree, and the associated comorbidities. I do wonder if he has a lot of oxygen desaturations at night, he does have frequent nocturia and may have some pulmonary hypertension. In addition the untreated obstructive sleep apnea may have let to left ventricular hyper trophy, hypertension or irregular heartbeats. He does not have a history of atrial fibrillation.  He describes pain in several joints, and had multiple orthopedic procedures but I did not find a classic description of restless legs.  Plan:  Treatment plan and additional workup :  SPLIT, please fit a small interface , nasal eson , etc. Split at 20 AHI. supine sleep.    Asencion Partridge Deirdra Heumann MD  10/30/2016   CC: [name], [address], [place] [id number]

## 2016-11-12 DIAGNOSIS — H0012 Chalazion right lower eyelid: Secondary | ICD-10-CM | POA: Diagnosis not present

## 2016-11-17 ENCOUNTER — Ambulatory Visit (INDEPENDENT_AMBULATORY_CARE_PROVIDER_SITE_OTHER): Payer: Medicare Other | Admitting: Neurology

## 2016-11-17 DIAGNOSIS — G4733 Obstructive sleep apnea (adult) (pediatric): Secondary | ICD-10-CM | POA: Diagnosis not present

## 2016-11-17 DIAGNOSIS — E6609 Other obesity due to excess calories: Secondary | ICD-10-CM

## 2016-11-19 DIAGNOSIS — H0012 Chalazion right lower eyelid: Secondary | ICD-10-CM | POA: Diagnosis not present

## 2016-11-20 ENCOUNTER — Telehealth: Payer: Self-pay

## 2016-11-20 NOTE — Telephone Encounter (Signed)
I called pt, spoke to pt's wife, [name], per DPR. I advised her that pt's sleep study confirmed moderate to severe sleep apnea with exacerbation during REM sleep and that Dr. Brett Fairy recommends a CPAP auto-titration at home. Pt's wife is agreeable to this. I advised her that I will send the cpap order to a DME, [facility], and they will call the pt within a week to get it set up. I reviewed cpap compliance expectations with the pt's wife. A follow up appt was made for 03/05/2017 at 1:30pm. Pt's wife verbalized understanding of results. Pt's wife had no questions at this time but was encouraged to call back if questions arise.

## 2016-11-20 NOTE — Procedures (Signed)
PATIENT'S NAME:  [name], [name] DOB:      07-01-1941      MR#:    [id number]     DATE OF RECORDING: 11/17/2016 REFERRING M.D.:  Lew Dawes, MD Study Performed:  Split-Night Titration Study HISTORY:  [name] is a 76 year old, right handed Caucasian male patient, with a history of OSA. diagnosed at [facility] in 2000 as  "severe" Obstructive Sleep Apnea. He has not been able to use CPAP, as the durable medical equipment company that furnished his machine went out of business within a month and he "could never get supplies again".  [name] past medical history includes sport related injuries, a total knee replacement in the year 2002, he had 3 lumbar fusions, a tonsillectomy in childhood, he has been diagnosed with iron deficiency anemia, has been treated for chronic pain with narcotic medications. 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  The patient endorsed the Epworth Sleepiness Scale at 12/24 points   The patient's weight 240 pounds with a height of 72 (inches), resulting in a BMI of 32.5 kg/m2. The patient's neck circumference measured 19 inches.  CURRENT MEDICATIONS: Aspirin, Cyanocobalamin, Duloxetine, Ferrex- sulphate, Finasteride, Folic acid, Furosemide, Methocarbamol, Naproxen Sodium, Nitroglycerin, Oxycodone, Ranitidine, Simvastatin, Tamsulosin, Tramadol, vitamin C and Vitamin D  PROCEDURE:  This is a multichannel digital polysomnogram utilizing the Somnostar 11.2 system.  Electrodes and sensors were applied and monitored per AASM Specifications.   EEG, EOG, Chin and Limb EMG, were sampled at 200 Hz.  ECG, Snore and Nasal Pressure, Thermal Airflow, Respiratory Effort, CPAP Flow and Pressure, Oximetry was sampled at 50 Hz. Digital video and audio were recorded.      BASELINE STUDY WITHOUT CPAP RESULTS:  Lights Out was at 22:11 and Lights On at 05:58.  Total  recording time (TRT) was 211.5, with a total sleep time (TST) of 133.5 minutes.   The patient's sleep latency was 79 minutes.  REM latency was 52 minutes.  The sleep efficiency was 63.1 %.   SLEEP ARCHITECTURE: WASO (Wake after sleep onset) was 35.5 minutes, Stage N1 was 11 minutes, Stage N2 was 90.5 minutes, Stage N3 was 0 minutes and Stage R (REM sleep) was 32 minutes.  The percentages were Stage N1 8.2%, Stage N2 67.8%, Stage N3 0% and Stage R (REM sleep) 24.%.   RESPIRATORY ANALYSIS:  There were a total of 74 respiratory events:  6 obstructive apneas, 0 central apneas and 68 hypopneas with 0 respiratory event related arousals (RERAs).  The total APNEA/HYPOPNEA INDEX (AHI) was 33.3 /hour and the total RESPIRATORY DISTURBANCE INDEX was 33.3 /hour.  30 events occurred in REM sleep and 78 events in NREM. The REM AHI was 56.3, /hour versus a non-REM AHI of 26.0/hr. The patient spent 293.5 minutes sleep time in the supine position 20 minutes in non-supine. The supine AHI was 32.8 /hour versus a non-supine AHI of 38.2 /hour.  OXYGEN SATURATION & C02:  The wake baseline 02 saturation was 95%, with the lowest being 74%. Time spent below 89% saturation equaled 30 minutes.  PERIODIC LIMB MOVEMENTS:    The patient had a total of 0 Periodic Limb Movements.  The arousals were noted as: 20 were spontaneous, 0 were associated with PLMs, and 75 were associated with respiratory events. Audio and video analysis did not show any abnormal or unusual movements, behaviors, phonations or vocalizations.  The patient took one bathroom break. Snoring was noted, the patient breathes through his mouth. EKG was in keeping with normal sinus rhythm (NSR)   TITRATION STUDY WITH CPAP RESULTS:  CPAP was initiated at 5 cmH20 with heated humidity per AASM split night standards and pressure was advanced to 12 cmH20 because of hypopneas, apneas and desaturations.  At a PAP pressure of 12 cmH20, there was a reduction of the AHI to 0.0  /hour. This patient only slept 12 minutes at 12 cm pressure, and may need further pressure increase.   Total recording time (TRT) was 255.5 minutes, with a total sleep time (TST) of 180 minutes. The patient's sleep latency was 3 minutes. REM latency was 20 minutes.  The sleep efficiency was 70.5 %.    SLEEP ARCHITECTURE: Wake after sleep was 74.5 minutes, Stage N1 22 minutes, Stage N2 128 minutes, Stage N3 0 minutes and Stage R (REM sleep) 30 minutes. The percentages were: Stage N1 12.2%, Stage N2 71.1%, Stage N3 0% and Stage R (REM sleep) 16.7%. REM rebound was noted right after CPAP was initiated. The arousals were noted as: 35 were spontaneous, 0 were associated with PLMs, and 86 were associated with respiratory events.  RESPIRATORY ANALYSIS:  There were a total of 86 respiratory events: 0 apneas and 86 hypopneas with 0 respiratory event related arousals (RERAs).     The total APNEA/HYPOPNEA INDEX (AHI) was 28.7 /hour and the total RESPIRATORY DISTURBANCE INDEX was 28.7 /hour.  16 events occurred in REM sleep and 70 events in NREM. The REM AHI was 32 /hour versus a non-REM AHI of 28 /hour.  The patient spent 95% of total sleep time in the supine position. The supine AHI was 30.2 /hour, versus a non-supine AHI of 0.0/hour.  OXYGEN SATURATION & C02:  The wake baseline 02 saturation was 92%, with the lowest being 85%. Time spent below 89% saturation equaled 5 minutes.  PERIODIC LIMB MOVEMENTS:    The patient had a total of 0 Periodic Limb Movements. The arousals were noted as: 20 were spontaneous, 0 were associated with PLMs, and 75 were associated with respiratory events. Audio and video analysis did not show any abnormal or unusual movements, behaviors, phonations or vocalizations The patient took no further bathroom breaks Snoring was alleviated. EKG was in keeping with normal sinus rhythm (NSR)    POLYSOMNOGRAPHY IMPRESSION :   1. Moderate severe Obstructive Sleep Apnea (OSA) with total  hypoxemia time of 30 minute. AHI was 33.1/hr. and not supine accentuated but REM dependent. REM AHI was 56.3/hr.  2. Primary Snoring.  RECOMMENDATIONS:  1. CPAP at 12 cm water alleviated the AHI but the sleep time under this pressure was to short. I will order a  device for auto-titration , between 7 cm water and 78m cm water pressure. 3 cm EPR.  2.  A FFM Simplus by Caryn Section and Paykel in medium size was used with heated humidity during this study.  Advise to add heated humidity.  Adjust interface and heated humidity as needed.     3. Compliance to PAP therapy should be emphasized.  Compliance, AHI and air leak information to be downloaded for objective assessment at 30 days, 180 days and annually thereafter.   4. Further information regarding OSA may be obtained from [employer] (www.sleepfoundation.org) or American Sleep Apnea Association (www.sleepapnea.org). 5. A follow up appointment will be scheduled in the Sleep Clinic at [facility].      I certify that I have  reviewed the entire raw data recording prior to the issuance of this report in accordance with the Standards of Accreditation of the American Academy of Sleep Medicine (AASM)    Larey Seat, M.D.  11-20-2016 Diplomat, American Board of Psychiatry and Neurology  Diplomat, [place] of Sleep Medicine Medical Director, [facility]

## 2016-11-20 NOTE — Addendum Note (Signed)
Addended by: Larey Seat on: 11/20/2016 10:42 AM   Modules accepted: Orders

## 2016-11-20 NOTE — Telephone Encounter (Signed)
-----   Message from Larey Seat, MD sent at 11/20/2016 10:42 AM EDT ----- Confirmed moderate severe sleep apnea with exacerbation during REM sleep. CPAP auto-titration machine was ordered.

## 2016-11-24 ENCOUNTER — Other Ambulatory Visit: Payer: Self-pay | Admitting: Internal Medicine

## 2016-11-29 ENCOUNTER — Other Ambulatory Visit: Payer: Self-pay | Admitting: Internal Medicine

## 2016-12-03 DIAGNOSIS — H04123 Dry eye syndrome of bilateral lacrimal glands: Secondary | ICD-10-CM | POA: Diagnosis not present

## 2016-12-03 DIAGNOSIS — H0012 Chalazion right lower eyelid: Secondary | ICD-10-CM | POA: Diagnosis not present

## 2016-12-03 DIAGNOSIS — H26492 Other secondary cataract, left eye: Secondary | ICD-10-CM | POA: Diagnosis not present

## 2016-12-04 ENCOUNTER — Other Ambulatory Visit (INDEPENDENT_AMBULATORY_CARE_PROVIDER_SITE_OTHER): Payer: Medicare Other

## 2016-12-04 DIAGNOSIS — I10 Essential (primary) hypertension: Secondary | ICD-10-CM

## 2016-12-04 DIAGNOSIS — D509 Iron deficiency anemia, unspecified: Secondary | ICD-10-CM

## 2016-12-04 LAB — IBC PANEL
Iron: 15 ug/dL — ABNORMAL LOW (ref 42–165)
Saturation Ratios: 3.4 % — ABNORMAL LOW (ref 20.0–50.0)
Transferrin: 313 mg/dL (ref 212.0–360.0)

## 2016-12-04 LAB — BASIC METABOLIC PANEL
BUN: 19 mg/dL (ref 6–23)
CO2: 30 mEq/L (ref 19–32)
Calcium: 9.4 mg/dL (ref 8.4–10.5)
Chloride: 103 mEq/L (ref 96–112)
Creatinine, Ser: 0.87 mg/dL (ref 0.40–1.50)
GFR: 90.74 mL/min (ref >60.00)
Glucose, Bld: 116 mg/dL — ABNORMAL HIGH (ref 70–99)
Potassium: 4.3 mEq/L (ref 3.5–5.1)
Sodium: 138 mEq/L (ref 135–145)

## 2016-12-04 LAB — CBC WITH DIFFERENTIAL/PLATELET
Basophils Absolute: 0 10*3/uL (ref 0.0–0.1)
Basophils Relative: 0.8 % (ref 0.0–3.0)
Eosinophils Absolute: 0.1 10*3/uL (ref 0.0–0.7)
Eosinophils Relative: 2.4 % (ref 0.0–5.0)
HCT: 29.6 % — ABNORMAL LOW (ref 39.0–52.0)
Hemoglobin: 9.3 g/dL — ABNORMAL LOW (ref 13.0–17.0)
Lymphocytes Relative: 24.4 % (ref 12.0–46.0)
Lymphs Abs: 1.2 10*3/uL (ref 0.7–4.0)
MCHC: 31.4 g/dL (ref 30.0–36.0)
MCV: 84.3 fl (ref 78.0–100.0)
Monocytes Absolute: 0.8 10*3/uL (ref 0.1–1.0)
Monocytes Relative: 16.7 % — ABNORMAL HIGH (ref 3.0–12.0)
Neutro Abs: 2.7 10*3/uL (ref 1.4–7.7)
Neutrophils Relative %: 55.7 % (ref 43.0–77.0)
Platelets: 269 10*3/uL (ref 150.0–400.0)
RBC: 3.52 Mil/uL — ABNORMAL LOW (ref 4.22–5.81)
RDW: 15.6 % — ABNORMAL HIGH (ref 11.5–15.5)
WBC: 4.9 10*3/uL (ref 4.0–10.5)

## 2016-12-05 DIAGNOSIS — G4733 Obstructive sleep apnea (adult) (pediatric): Secondary | ICD-10-CM | POA: Diagnosis not present

## 2016-12-10 ENCOUNTER — Other Ambulatory Visit: Payer: Self-pay

## 2016-12-10 NOTE — Telephone Encounter (Signed)
Rec'd Fax from [facility] for Ferrex 150. You have not prescribed this medication before. Okay to refill?

## 2016-12-30 ENCOUNTER — Encounter: Payer: Self-pay | Admitting: Internal Medicine

## 2016-12-30 ENCOUNTER — Ambulatory Visit (INDEPENDENT_AMBULATORY_CARE_PROVIDER_SITE_OTHER): Payer: Medicare Other | Admitting: Internal Medicine

## 2016-12-30 ENCOUNTER — Other Ambulatory Visit (INDEPENDENT_AMBULATORY_CARE_PROVIDER_SITE_OTHER): Payer: Medicare Other

## 2016-12-30 VITALS — BP 108/54 | HR 71 | Temp 98.2°F | Ht 72.0 in | Wt 234.0 lb

## 2016-12-30 DIAGNOSIS — I739 Peripheral vascular disease, unspecified: Secondary | ICD-10-CM

## 2016-12-30 DIAGNOSIS — Z23 Encounter for immunization: Secondary | ICD-10-CM | POA: Diagnosis not present

## 2016-12-30 DIAGNOSIS — E538 Deficiency of other specified B group vitamins: Secondary | ICD-10-CM

## 2016-12-30 DIAGNOSIS — M79605 Pain in left leg: Secondary | ICD-10-CM

## 2016-12-30 DIAGNOSIS — I2510 Atherosclerotic heart disease of native coronary artery without angina pectoris: Secondary | ICD-10-CM | POA: Diagnosis not present

## 2016-12-30 DIAGNOSIS — M79604 Pain in right leg: Secondary | ICD-10-CM

## 2016-12-30 DIAGNOSIS — M5440 Lumbago with sciatica, unspecified side: Secondary | ICD-10-CM | POA: Diagnosis not present

## 2016-12-30 LAB — CBC WITH DIFFERENTIAL/PLATELET
Basophils Absolute: 0.1 10*3/uL (ref 0.0–0.1)
Basophils Relative: 1.3 % (ref 0.0–3.0)
Eosinophils Absolute: 0.2 10*3/uL (ref 0.0–0.7)
Eosinophils Relative: 3.5 % (ref 0.0–5.0)
HCT: 29 % — ABNORMAL LOW (ref 39.0–52.0)
Hemoglobin: 9.1 g/dL — ABNORMAL LOW (ref 13.0–17.0)
Lymphocytes Relative: 21 % (ref 12.0–46.0)
Lymphs Abs: 1.1 10*3/uL (ref 0.7–4.0)
MCHC: 31.3 g/dL (ref 30.0–36.0)
MCV: 81.7 fl (ref 78.0–100.0)
Monocytes Absolute: 0.9 10*3/uL (ref 0.1–1.0)
Monocytes Relative: 16.9 % — ABNORMAL HIGH (ref 3.0–12.0)
Neutro Abs: 3.1 10*3/uL (ref 1.4–7.7)
Neutrophils Relative %: 57.3 % (ref 43.0–77.0)
Platelets: 278 10*3/uL (ref 150.0–400.0)
RBC: 3.54 Mil/uL — ABNORMAL LOW (ref 4.22–5.81)
RDW: 17.4 % — ABNORMAL HIGH (ref 11.5–15.5)
WBC: 5.5 10*3/uL (ref 4.0–10.5)

## 2016-12-30 LAB — VITAMIN B12: Vitamin B-12: 899 pg/mL (ref 211–911)

## 2016-12-30 LAB — SEDIMENTATION RATE: Sed Rate: 15 mm/hr (ref 0–20)

## 2016-12-30 LAB — CK: Total CK: 44 U/L (ref 7–232)

## 2016-12-30 MED ORDER — FERREX 150 150 MG PO CAPS: 150.0000 mg | ORAL_CAPSULE | Freq: Every day | ORAL | 5 refills | Status: DC

## 2016-12-30 NOTE — Assessment & Plan Note (Signed)
Not bad lately

## 2016-12-30 NOTE — Assessment & Plan Note (Signed)
On B12 

## 2016-12-30 NOTE — Progress Notes (Signed)
Subjective:  Patient ID: [name], male    DOB: [birth year]  Age: 76 y.o. MRN: [id number]  CC: No chief complaint on file.   HPI [name] presents for B12 def, depression, CAD, OA f/u. C/o B hip pain for several months; c/o stiffness.   Outpatient Medications Prior to Visit  Medication Sig Dispense Refill  . aspirin 81 MG chewable tablet Chew 81 mg by mouth at bedtime.    . Cyanocobalamin (VITAMIN B-12) 1000 MCG SUBL Place 1 tablet (1,000 mcg total) under the tongue daily. 100 tablet 3  . DULoxetine (CYMBALTA) 60 MG capsule TAKE 1 CAPSULE (60 MG TOTAL) BY MOUTH DAILY. 90 capsule 0  . FERREX 150 150 MG capsule Take 1 tablet by mouth daily.  3  . finasteride (PROSCAR) 5 MG tablet TAKE 1 TABLET (5 MG TOTAL) BY MOUTH DAILY. 90 tablet 1  . folic acid (V-R FOLIC ACID) 315 MCG tablet Take 1 tablet (400 mcg total) by mouth daily. 90 tablet 3  . furosemide (LASIX) 20 MG tablet TAKE 1 TO 2 TABLETS BY  MOUTH DAILY AS NEEDED FOR  EDEMA 180 tablet 3  . methocarbamol (ROBAXIN) 500 MG tablet Take 1 tablet (500 mg total) by mouth 3 (three) times daily as needed. 60 tablet 1  . naproxen sodium (ANAPROX) 220 MG tablet Take 220-440 mg by mouth daily. Depends on pain level if takes 1 or 2 tablets    . nitroGLYCERIN (NITROSTAT) 0.4 MG SL tablet Place 1 tablet (0.4 mg total) under the tongue every 5 (five) minutes as needed for chest pain. 20 tablet 3  . oxyCODONE-acetaminophen (ROXICET) 5-325 MG tablet Take 1-2 tablets by mouth every 4 (four) hours as needed for severe pain. 60 tablet 0  . ranitidine (ZANTAC) 150 MG tablet Take 1 tablet (150 mg total) by mouth 2 (two) times daily. 180 tablet 3  . simvastatin (ZOCOR) 40 MG tablet TAKE 1 TABLET BY MOUTH AT  BEDTIME 90 tablet 3  . tamsulosin (FLOMAX) 0.4 MG CAPS capsule Take 2 capsules (0.8 mg total) by mouth daily. 180 capsule 2  . tamsulosin (FLOMAX) 0.4 MG CAPS capsule TAKE 2 CAPSULES BY MOUTH  DAILY 180 capsule 1  . traMADol (ULTRAM) 50 MG  tablet Take 1-2 tablets (50-100 mg total) by mouth 2 (two) times daily as needed for severe pain. 120 tablet 3  . vitamin C (ASCORBIC ACID) 500 MG tablet Take 1 tablet (500 mg total) by mouth daily. 100 tablet 1  . VITAMIN D, ERGOCALCIFEROL, PO Take 1 tablet by mouth daily.    . DULoxetine (CYMBALTA) 60 MG capsule TAKE 1 CAPSULE BY MOUTH  DAILY 90 capsule 1   No facility-administered medications prior to visit.     ROS Review of Systems  Constitutional: Negative for appetite change, fatigue and unexpected weight change.  HENT: Negative for congestion, nosebleeds, sneezing, sore throat and trouble swallowing.   Eyes: Negative for itching and visual disturbance.  Respiratory: Negative for cough.   Cardiovascular: Negative for chest pain, palpitations and leg swelling.  Gastrointestinal: Negative for abdominal distention, blood in stool, diarrhea and nausea.  Genitourinary: Negative for frequency and hematuria.  Musculoskeletal: Positive for arthralgias, back pain and gait problem. Negative for joint swelling and neck pain.  Skin: Negative for rash.  Neurological: Negative for dizziness, tremors, speech difficulty and weakness.  Psychiatric/Behavioral: Negative for agitation, dysphoric mood and sleep disturbance. The patient is not nervous/anxious.     Objective:  BP (!) 108/54 (BP Location:  Left Arm, Patient Position: Sitting, Cuff Size: Large)   Pulse 71   Temp 98.2 F (36.8 C) (Oral)   Ht 6' (1.829 m)   Wt 234 lb 0.6 oz (106.2 kg)   SpO2 98%   BMI 31.74 kg/m   BP Readings from Last 3 Encounters:  12/30/16 (!) 108/54  10/30/16 128/71  09/30/16 138/78    Wt Readings from Last 3 Encounters:  12/30/16 234 lb 0.6 oz (106.2 kg)  10/30/16 240 lb (108.9 kg)  09/30/16 242 lb 8 oz (110 kg)    Physical Exam  Constitutional: He is oriented to person, place, and time. He appears well-developed. No distress.  NAD  HENT:  Mouth/Throat: Oropharynx is clear and moist.  Eyes:  Conjunctivae are normal. Pupils are equal, round, and reactive to light.  Neck: Normal range of motion. No JVD present. No thyromegaly present.  Cardiovascular: Normal rate, regular rhythm, normal heart sounds and intact distal pulses.  Exam reveals no gallop and no friction rub.   No murmur heard. Pulmonary/Chest: Effort normal and breath sounds normal. No respiratory distress. He has no wheezes. He has no rales. He exhibits no tenderness.  Abdominal: Soft. Bowel sounds are normal. He exhibits no distension and no mass. There is no tenderness. There is no rebound and no guarding.  Musculoskeletal: Normal range of motion. He exhibits tenderness. He exhibits no edema.  Lymphadenopathy:    He has no cervical adenopathy.  Neurological: He is alert and oriented to person, place, and time. He has normal reflexes. No cranial nerve deficit. He exhibits normal muscle tone. He displays a negative Romberg sign. Coordination and gait normal.  Skin: Skin is warm and dry. No rash noted.  Psychiatric: He has a normal mood and affect. His behavior is normal. Judgment and thought content normal.  pulses decreased a little Hips are tender w/ROM   Lab Results  Component Value Date   WBC 4.9 12/04/2016   HGB 9.3 (L) 12/04/2016   HCT 29.6 (L) 12/04/2016   PLT 269.0 12/04/2016   GLUCOSE 116 (H) 12/04/2016   CHOL 139 02/22/2016   TRIG 74.0 02/22/2016   HDL 51.70 02/22/2016   LDLDIRECT 157.5 07/05/2008   LDLCALC 73 02/22/2016   ALT 14 08/13/2015   AST 17 08/13/2015   NA 138 12/04/2016   K 4.3 12/04/2016   CL 103 12/04/2016   CREATININE 0.87 12/04/2016   BUN 19 12/04/2016   CO2 30 12/04/2016   TSH 0.831 08/13/2015   PSA 0.49 03/01/2012   INR 1.06 08/13/2015    No results found.  Assessment & Plan:   There are no diagnoses linked to this encounter. I am having [name] maintain his Vitamin Y-70, aspirin, folic acid, nitroGLYCERIN, vitamin C, DULoxetine, finasteride, FERREX 150, naproxen  sodium, (VITAMIN D, ERGOCALCIFEROL, PO), oxyCODONE-acetaminophen, methocarbamol, traMADol, furosemide, simvastatin, ranitidine, tamsulosin, and tamsulosin.  No orders of the defined types were placed in this encounter.    Follow-up: No Follow-up on file.  [name], MD

## 2016-12-30 NOTE — Assessment & Plan Note (Signed)
ASA, Simvastatin B hip pain for several months; c/o stiffness: claudication vs other. He wants to see Dr Theda Sers first

## 2016-12-30 NOTE — Assessment & Plan Note (Signed)
ASA, Simvastatin 

## 2016-12-30 NOTE — Progress Notes (Signed)
Pre visit review using our clinic review tool, if applicable. No additional management support is needed unless otherwise documented below in the visit note. 

## 2016-12-30 NOTE — Assessment & Plan Note (Signed)
B hip pain for several months; c/o stiffness. He has an appt w/Dr Theda Sers soon

## 2016-12-30 NOTE — Addendum Note (Signed)
Addended by: Karren Cobble on: 12/30/2016 08:18 AM   Modules accepted: Orders

## 2017-01-04 DIAGNOSIS — G4733 Obstructive sleep apnea (adult) (pediatric): Secondary | ICD-10-CM | POA: Diagnosis not present

## 2017-01-05 DIAGNOSIS — Z981 Arthrodesis status: Secondary | ICD-10-CM | POA: Diagnosis not present

## 2017-01-05 DIAGNOSIS — M48062 Spinal stenosis, lumbar region with neurogenic claudication: Secondary | ICD-10-CM | POA: Diagnosis not present

## 2017-01-14 DIAGNOSIS — M48062 Spinal stenosis, lumbar region with neurogenic claudication: Secondary | ICD-10-CM | POA: Diagnosis not present

## 2017-01-20 DIAGNOSIS — Z981 Arthrodesis status: Secondary | ICD-10-CM | POA: Diagnosis not present

## 2017-01-20 DIAGNOSIS — M48062 Spinal stenosis, lumbar region with neurogenic claudication: Secondary | ICD-10-CM | POA: Diagnosis not present

## 2017-02-04 DIAGNOSIS — G4733 Obstructive sleep apnea (adult) (pediatric): Secondary | ICD-10-CM | POA: Diagnosis not present

## 2017-02-06 DIAGNOSIS — B9689 Other specified bacterial agents as the cause of diseases classified elsewhere: Secondary | ICD-10-CM | POA: Diagnosis not present

## 2017-02-06 DIAGNOSIS — J069 Acute upper respiratory infection, unspecified: Secondary | ICD-10-CM | POA: Diagnosis not present

## 2017-02-09 ENCOUNTER — Other Ambulatory Visit: Payer: Self-pay | Admitting: Internal Medicine

## 2017-02-16 DIAGNOSIS — G629 Polyneuropathy, unspecified: Secondary | ICD-10-CM | POA: Diagnosis not present

## 2017-03-03 ENCOUNTER — Ambulatory Visit (INDEPENDENT_AMBULATORY_CARE_PROVIDER_SITE_OTHER): Payer: Medicare Other | Admitting: Internal Medicine

## 2017-03-03 ENCOUNTER — Telehealth: Payer: Self-pay | Admitting: *Deleted

## 2017-03-03 ENCOUNTER — Other Ambulatory Visit (INDEPENDENT_AMBULATORY_CARE_PROVIDER_SITE_OTHER): Payer: Medicare Other

## 2017-03-03 ENCOUNTER — Encounter: Payer: Self-pay | Admitting: Internal Medicine

## 2017-03-03 DIAGNOSIS — E538 Deficiency of other specified B group vitamins: Secondary | ICD-10-CM | POA: Diagnosis not present

## 2017-03-03 DIAGNOSIS — I2510 Atherosclerotic heart disease of native coronary artery without angina pectoris: Secondary | ICD-10-CM | POA: Diagnosis not present

## 2017-03-03 DIAGNOSIS — R29898 Other symptoms and signs involving the musculoskeletal system: Secondary | ICD-10-CM | POA: Diagnosis not present

## 2017-03-03 DIAGNOSIS — M5440 Lumbago with sciatica, unspecified side: Secondary | ICD-10-CM | POA: Diagnosis not present

## 2017-03-03 DIAGNOSIS — E785 Hyperlipidemia, unspecified: Secondary | ICD-10-CM | POA: Diagnosis not present

## 2017-03-03 LAB — CBC WITH DIFFERENTIAL/PLATELET
Basophils Absolute: 0.1 10*3/uL (ref 0.0–0.1)
Basophils Relative: 2 % (ref 0.0–3.0)
Eosinophils Absolute: 0.3 10*3/uL (ref 0.0–0.7)
Eosinophils Relative: 6 % — ABNORMAL HIGH (ref 0.0–5.0)
HCT: 28.3 % — ABNORMAL LOW (ref 39.0–52.0)
Hemoglobin: 8.5 g/dL — ABNORMAL LOW (ref 13.0–17.0)
Lymphocytes Relative: 27.8 % (ref 12.0–46.0)
Lymphs Abs: 1.2 10*3/uL (ref 0.7–4.0)
MCHC: 30.2 g/dL (ref 30.0–36.0)
MCV: 71.7 fl — ABNORMAL LOW (ref 78.0–100.0)
Monocytes Absolute: 0.9 10*3/uL (ref 0.1–1.0)
Monocytes Relative: 21.2 % — ABNORMAL HIGH (ref 3.0–12.0)
Neutro Abs: 1.8 10*3/uL (ref 1.4–7.7)
Neutrophils Relative %: 43 % (ref 43.0–77.0)
Platelets: 251 10*3/uL (ref 150.0–400.0)
RBC: 3.94 Mil/uL — ABNORMAL LOW (ref 4.22–5.81)
RDW: 18.3 % — ABNORMAL HIGH (ref 11.5–15.5)
WBC: 4.3 10*3/uL (ref 4.0–10.5)

## 2017-03-03 LAB — BASIC METABOLIC PANEL
BUN: 17 mg/dL (ref 6–23)
CO2: 29 mEq/L (ref 19–32)
Calcium: 9.6 mg/dL (ref 8.4–10.5)
Chloride: 104 mEq/L (ref 96–112)
Creatinine, Ser: 0.81 mg/dL (ref 0.40–1.50)
GFR: 98.48 mL/min (ref >60.00)
Glucose, Bld: 105 mg/dL — ABNORMAL HIGH (ref 70–99)
Potassium: 4 mEq/L (ref 3.5–5.1)
Sodium: 142 mEq/L (ref 135–145)

## 2017-03-03 LAB — CK: Total CK: 51 U/L (ref 7–232)

## 2017-03-03 NOTE — Assessment & Plan Note (Signed)
Tramadol prn ° Potential benefits of a long term opioids use as well as potential risks (i.e. addiction risk, apnea etc) and complications (i.e. Somnolence, constipation and others) were explained to the patient and were aknowledged. ° ° °

## 2017-03-03 NOTE — Progress Notes (Signed)
Subjective:  Patient ID: [name], male    DOB: 09-Jun-1941  Age: 76 y.o. MRN: [id number]  CC: No chief complaint on file.   HPI [name] presents for OA, LBP, CAD, HTN f/u C/o leg pain and claudication; weak legs after a 1/2 a block. He did see Ortho and, neurology; had an MRI of the LS spine and a ?NCS  Outpatient Medications Prior to Visit  Medication Sig Dispense Refill  . aspirin 81 MG chewable tablet Chew 81 mg by mouth at bedtime.    . Cyanocobalamin (VITAMIN B-12) 1000 MCG SUBL Place 1 tablet (1,000 mcg total) under the tongue daily. 100 tablet 3  . DULoxetine (CYMBALTA) 60 MG capsule TAKE 1 CAPSULE (60 MG TOTAL) BY MOUTH DAILY. 90 capsule 0  . FERREX 150 150 MG capsule Take 1 capsule (150 mg total) by mouth daily. 30 capsule 5  . finasteride (PROSCAR) 5 MG tablet TAKE 1 TABLET (5 MG TOTAL) BY MOUTH DAILY. 90 tablet 1  . finasteride (PROSCAR) 5 MG tablet TAKE 1 TABLET BY MOUTH  DAILY 90 tablet 1  . folic acid (V-R FOLIC ACID) 938 MCG tablet Take 1 tablet (400 mcg total) by mouth daily. 90 tablet 3  . furosemide (LASIX) 20 MG tablet TAKE 1 TO 2 TABLETS BY  MOUTH DAILY AS NEEDED FOR  EDEMA 180 tablet 3  . methocarbamol (ROBAXIN) 500 MG tablet Take 1 tablet (500 mg total) by mouth 3 (three) times daily as needed. 60 tablet 1  . naproxen sodium (ANAPROX) 220 MG tablet Take 220-440 mg by mouth daily. Depends on pain level if takes 1 or 2 tablets    . nitroGLYCERIN (NITROSTAT) 0.4 MG SL tablet Place 1 tablet (0.4 mg total) under the tongue every 5 (five) minutes as needed for chest pain. 20 tablet 3  . ranitidine (ZANTAC) 150 MG tablet Take 1 tablet (150 mg total) by mouth 2 (two) times daily. 180 tablet 3  . simvastatin (ZOCOR) 40 MG tablet TAKE 1 TABLET BY MOUTH AT  BEDTIME 90 tablet 3  . tamsulosin (FLOMAX) 0.4 MG CAPS capsule Take 2 capsules (0.8 mg total) by mouth daily. 180 capsule 2  . tamsulosin (FLOMAX) 0.4 MG CAPS capsule TAKE 2 CAPSULES BY MOUTH  DAILY 180  capsule 1  . traMADol (ULTRAM) 50 MG tablet Take 1-2 tablets (50-100 mg total) by mouth 2 (two) times daily as needed for severe pain. 120 tablet 3  . vitamin C (ASCORBIC ACID) 500 MG tablet Take 1 tablet (500 mg total) by mouth daily. 100 tablet 1  . VITAMIN D, ERGOCALCIFEROL, PO Take 1 tablet by mouth daily.     No facility-administered medications prior to visit.     ROS Review of Systems  Constitutional: Negative for appetite change, fatigue and unexpected weight change.  HENT: Negative for congestion, nosebleeds, sneezing, sore throat and trouble swallowing.   Eyes: Negative for itching and visual disturbance.  Respiratory: Negative for cough.   Cardiovascular: Negative for chest pain, palpitations and leg swelling.  Gastrointestinal: Negative for abdominal distention, blood in stool, diarrhea and nausea.  Genitourinary: Negative for frequency and hematuria.  Musculoskeletal: Negative for back pain, gait problem, joint swelling and neck pain.  Skin: Negative for rash.  Neurological: Negative for dizziness, tremors, speech difficulty and weakness.  Psychiatric/Behavioral: Negative for agitation, dysphoric mood, sleep disturbance and suicidal ideas. The patient is not nervous/anxious.     Objective:  BP 110/72 (BP Location: Left Arm, Patient Position: Sitting, Cuff  Size: Large)   Pulse 64   Temp 97.7 F (36.5 C) (Oral)   Ht 6' (1.829 m)   Wt 238 lb (108 kg)   SpO2 99%   BMI 32.28 kg/m   BP Readings from Last 3 Encounters:  03/03/17 110/72  12/30/16 (!) 108/54  10/30/16 128/71    Wt Readings from Last 3 Encounters:  03/03/17 238 lb (108 kg)  12/30/16 234 lb 0.6 oz (106.2 kg)  10/30/16 240 lb (108.9 kg)    Physical Exam  Constitutional: He is oriented to person, place, and time. He appears well-developed. No distress.  NAD  HENT:  Mouth/Throat: Oropharynx is clear and moist.  Eyes: Conjunctivae are normal. Pupils are equal, round, and reactive to light.  Neck:  Normal range of motion. No JVD present. No thyromegaly present.  Cardiovascular: Normal rate, regular rhythm, normal heart sounds and intact distal pulses.  Exam reveals no gallop and no friction rub.   No murmur heard. Pulmonary/Chest: Effort normal and breath sounds normal. No respiratory distress. He has no wheezes. He has no rales. He exhibits no tenderness.  Abdominal: Soft. Bowel sounds are normal. He exhibits no distension and no mass. There is no tenderness. There is no rebound and no guarding.  Musculoskeletal: Normal range of motion. He exhibits no edema or tenderness.  Lymphadenopathy:    He has no cervical adenopathy.  Neurological: He is alert and oriented to person, place, and time. He has normal reflexes. No cranial nerve deficit. He exhibits normal muscle tone. He displays a negative Romberg sign. Coordination and gait normal.  Skin: Skin is warm and dry. No rash noted.  Psychiatric: He has a normal mood and affect. His behavior is normal. Judgment and thought content normal.  trace edema B 5-/5 MS  Lab Results  Component Value Date   WBC 5.5 12/30/2016   HGB 9.1 (L) 12/30/2016   HCT 29.0 (L) 12/30/2016   PLT 278.0 12/30/2016   GLUCOSE 116 (H) 12/04/2016   CHOL 139 02/22/2016   TRIG 74.0 02/22/2016   HDL 51.70 02/22/2016   LDLDIRECT 157.5 07/05/2008   LDLCALC 73 02/22/2016   ALT 14 08/13/2015   AST 17 08/13/2015   NA 138 12/04/2016   K 4.3 12/04/2016   CL 103 12/04/2016   CREATININE 0.87 12/04/2016   BUN 19 12/04/2016   CO2 30 12/04/2016   TSH 0.831 08/13/2015   PSA 0.49 03/01/2012   INR 1.06 08/13/2015    No results found.  Assessment & Plan:   There are no diagnoses linked to this encounter. I am having [name] maintain his Vitamin J-47, aspirin, folic acid, nitroGLYCERIN, vitamin C, DULoxetine, finasteride, naproxen sodium, (VITAMIN D, ERGOCALCIFEROL, PO), methocarbamol, traMADol, furosemide, simvastatin, ranitidine, tamsulosin, tamsulosin, FERREX  150, and finasteride.  No orders of the defined types were placed in this encounter.    Follow-up: No Follow-up on file.  Lee Kehr, MD

## 2017-03-03 NOTE — Assessment & Plan Note (Signed)
Hold simvastatin due to leg weakness

## 2017-03-03 NOTE — Assessment & Plan Note (Signed)
On B12 

## 2017-03-03 NOTE — Assessment & Plan Note (Signed)
Hold Simvastatin Take CoQ10

## 2017-03-03 NOTE — Telephone Encounter (Signed)
-----   Message from Heath Lark, MD sent at 03/03/2017 11:37 AM EDT ----- Regarding: should I see him back? Patient has hix Iron def anemia PCP routed test results to me Can you call the patient whether he wants to be seen? He will probably need IV iron Pleast let me know ----- Message ----- From: Interface, Lab In Three Zero One Sent: 03/03/2017  11:23 AM To: Heath Lark, MD

## 2017-03-03 NOTE — Telephone Encounter (Signed)
Lm on home phone with message below. Asked to call us back to let us know how he would like to proceed

## 2017-03-03 NOTE — Patient Instructions (Signed)
Hold Simvastatin Take CoQ10

## 2017-03-03 NOTE — Assessment & Plan Note (Signed)
ASA, hold Simvastatin

## 2017-03-04 DIAGNOSIS — Z981 Arthrodesis status: Secondary | ICD-10-CM | POA: Diagnosis not present

## 2017-03-04 DIAGNOSIS — M48062 Spinal stenosis, lumbar region with neurogenic claudication: Secondary | ICD-10-CM | POA: Diagnosis not present

## 2017-03-04 NOTE — Telephone Encounter (Signed)
Wife called and stated,"I'm returning [name]'s phone call from yesterday. We did not get the message until after 5 pm. [name] is interested in making an appointment with Dr. Alvy Bimler and receiving an iron infusion." I told her I would give this message to Dr. Alvy Bimler and the scheduling department will be contacting her. She verbalized understanding.

## 2017-03-05 ENCOUNTER — Other Ambulatory Visit: Payer: Self-pay | Admitting: Hematology and Oncology

## 2017-03-05 ENCOUNTER — Ambulatory Visit (INDEPENDENT_AMBULATORY_CARE_PROVIDER_SITE_OTHER): Payer: Medicare Other | Admitting: Neurology

## 2017-03-05 ENCOUNTER — Telehealth: Payer: Self-pay | Admitting: Hematology and Oncology

## 2017-03-05 ENCOUNTER — Encounter: Payer: Self-pay | Admitting: Neurology

## 2017-03-05 VITALS — BP 104/55 | HR 70 | Ht 72.0 in | Wt 241.0 lb

## 2017-03-05 DIAGNOSIS — G4752 REM sleep behavior disorder: Secondary | ICD-10-CM | POA: Diagnosis not present

## 2017-03-05 DIAGNOSIS — G4733 Obstructive sleep apnea (adult) (pediatric): Secondary | ICD-10-CM | POA: Insufficient documentation

## 2017-03-05 DIAGNOSIS — Z9989 Dependence on other enabling machines and devices: Secondary | ICD-10-CM | POA: Diagnosis not present

## 2017-03-05 DIAGNOSIS — D500 Iron deficiency anemia secondary to blood loss (chronic): Secondary | ICD-10-CM

## 2017-03-05 NOTE — Progress Notes (Signed)
SLEEP MEDICINE CLINIC   Provider:  Larey Seat, M D  Referring Provider: Cassandria Anger, MD Primary Care Physician:  Cassandria Anger, MD  Chief Complaint  Patient presents with  . Follow-up    mask doesnt fit    I'm seeing [name] today on 03/05/2017 in a revisit,  He has undergone a split-night titration study on 11/17/2016 and was re- diagnosed with obstructive sleep apnea. He had mostly shallow breathing spells, all obstructive in nature with an index of 33.3 per hour of sleep, during REM sleep his AHI was 56.3 and in supine sleeps 32.8/hr. He also describes arousals out of dream sleep sometimes with a hint of confusion. The CPAP titration did make an impact but the patient only slept 12 minutes at the 12 cm water pressure that the front most beneficial. He produced a lot of apneas in spite of being on CPAP at pressures just below. For this reason I had placed him on an auto titrating between 7 and 15 cm water with 3 cm expiratory pressure relief. The patient returns today with high pressure leaks and a high number of residual apneas at 13.9 per hour. We have to me today to find a way to reduce his apnea to under 5 / hr. This seems to be a mask fit issues. The patient has a lot of air leaks and he describes discomfort from either tightening the mask too much or from having sounds produced by the air leaking. He remains fatigued with it fatigue severity score 53 and his Epworth score is elevated at 13 points. He is not depressed according to his geriatric depression scale at 2 points.   HPI:   [name] is a 76 y.o. male , seen here as a referral  from Dr. Alain Marion for a sleep consultation,  [name] is a 76 year old, right handed, caucasian married male patient, with a history of OSA. His original sleep study was performed at [facility] on [facility] in 2000  and revealed " severe" Obstructive Sleep Apnea. He has not been able To use CPAP, but it  was not that he could not tolerate the CPAP- the durable medical equipment company furnished machine went out of business was in a month or so after his machine was issued  and he could never get supplies again. Their office was on [address] he recalls. 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 He is surprised to learn that there are nasal pillows, nasal masks etc. and how small and quiet today CPAP machines. I also explained that many CPAP machines can be used as BiPAP as well.  [name] past medical history includes spot related injuries making it necessary to undergo a total knee replacement in the year 2002, he had 3 lumbar fusions, a tonsillectomy in childhood, he has been diagnosed in the past with iron deficiency anemia, has been treated for chronic pain with narcotic medications. 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 Chief complaint according to patient : " I snore, I stop breathing, my wife is concerned about my erratic breathing"  Sleep habits are as follows:  He usually goes to bed to watch TV news -around 11.00 PM . And it will take him usually 15-30 minutes but not longer to go to sleep,  and usually falls asleep on his side. He does resume a supine position later during the night. He does snore and his wife reports that breathing is worse when supine position. He sleeps on only one pillow but elevates the head of bed with a wedge. His bedroom is described as cool, quiet and dark. Nocturia 3 or 4 at night. He wakes with hand and leg pain, cramping.  He rises at 8 AM, and feels " OK ", but craves more sleep. He wakes without headaches, but feels stiff.    Sleep medical history and family sleep history:  Tonsillectomy in childhood.   Social history: ex smoker , since 89, ETOH ,  Wine once a month , caffeine : coffee  4-6  cups, iced tea 1 glas at dinner, Soda - rarely.  No shift work history, worked as a Dealer.   Review of Systems: Out of a complete 14 system review, the patient complains of only the following symptoms, and all other reviewed systems are negative. Epworth score 12 , Fatigue severity score 35  , depression score 2/15    Social History   Social History  . Marital status: Married    Spouse name: N/A  . Number of children: N/A  . Years of education: N/A   Occupational History  . Retired Retired    Dealer   Social History Main Topics  . Smoking status: Former Smoker    Quit date: 01/08/1987  . Smokeless tobacco: Never Used  . Alcohol use 0.0 oz/week     Comment: rare  . Drug use: No  . Sexual activity: Not Currently   Other Topics Concern  . Not on file   Social History Narrative   He lives with wife in a 2 story home.  Has 2 children.   Retired Dealer.   Highest level of education:  High school   Regular Exercise -  YES    Family History  Problem Relation Age of Onset  . Aneurysm Father        AAA  . Heart disease Father 34       CHF  . Alzheimer's disease Father   . Cancer Brother   . Alzheimer's disease Brother        dementia  . Stomach cancer Brother   . Cancer Mother 33       breast cancer  . Heart disease Mother 65       MI  . Autoimmune disease Son   . CAD Brother   . Colon cancer Neg Hx   . Esophageal cancer Neg Hx   . Pancreatic cancer Neg Hx   . Prostate cancer Neg Hx   . Rectal cancer Neg Hx     Past Medical History:  Diagnosis Date  . Anemia   . Blood transfusion without reported diagnosis 02/23/2014   3 units for iron deficiency anemia. as a baby - whenhe had pneumonia  . BPH (benign prostatic hyperplasia)   . CTS (carpal tunnel syndrome)    better  . Dyspnea   . GERD (gastroesophageal reflux disease)   . Hyperlipidemia   . Insomnia   . LBP (low back pain)   . Osteoarthritis   . Pneumonia    as a baby  . Sleep apnea     no cpap    Past Surgical History:  Procedure Laterality Date  . CARDIAC CATHETERIZATION N/A 08/15/2015   Procedure: Left Heart Cath and Coronary Angiography;  Surgeon: Lorretta Harp, MD;  Location: [facility];  Service: Cardiovascular;  Laterality: N/A;  . CARPAL TUNNEL RELEASE Bilateral 1996, 2004   Dr Lenoard Aden thumb surgery.  more than once  . COLONOSCOPY  2005, 2012   diverticulosis.   Marland Kitchen ESOPHAGOGASTRODUODENOSCOPY N/A 02/23/2014   Procedure: ESOPHAGOGASTRODUODENOSCOPY (EGD);  Surgeon: Irene Shipper, MD;  Location: [facility];  Service: Endoscopy;  Laterality: N/A;  . EYE SURGERY  2012   both cataracts, Lasik  . LUMBAR FUSION     x 3  . REVERSE SHOULDER ARTHROPLASTY Left 07/04/2016   Procedure: LEFT REVERSE SHOULDER ARTHROPLASTY;  Surgeon: Netta Cedars, MD;  Location: [place] [facility];  Service: Orthopedics;  Laterality: Left;  . TONSILLECTOMY  1946  . TOTAL KNEE ARTHROPLASTY  2003   Right    Current Outpatient Prescriptions  Medication Sig Dispense Refill  . aspirin 81 MG chewable tablet Chew 81 mg by mouth at bedtime.    . Cyanocobalamin (VITAMIN B-12) 1000 MCG SUBL Place 1 tablet (1,000 mcg total) under the tongue daily. 100 tablet 3  . DULoxetine (CYMBALTA) 60 MG capsule TAKE 1 CAPSULE (60 MG TOTAL) BY MOUTH DAILY. 90 capsule 0  . FERREX 150 150 MG capsule Take 1 capsule (150 mg total) by mouth daily. 30 capsule 5  . finasteride (PROSCAR) 5 MG tablet TAKE 1 TABLET BY MOUTH  DAILY 90 tablet 1  . folic acid (V-R FOLIC ACID) 315 MCG tablet Take 1 tablet (400 mcg total) by mouth daily. 90 tablet 3  . furosemide (LASIX) 20 MG tablet TAKE 1 TO 2 TABLETS BY  MOUTH DAILY AS NEEDED FOR  EDEMA (Patient taking differently: take 2 tab) 180 tablet 3  . methocarbamol (ROBAXIN) 500 MG tablet Take 1 tablet (500 mg total) by mouth 3 (three) times daily as needed. 60 tablet 1  . naproxen sodium (ANAPROX) 220 MG tablet Take 220-440 mg by mouth daily. Depends on pain level if takes 1 or 2  tablets    . nitroGLYCERIN (NITROSTAT) 0.4 MG SL tablet Place 1 tablet (0.4 mg total) under the tongue every 5 (five) minutes as needed for chest pain. 20 tablet 3  . ranitidine (ZANTAC) 150 MG tablet Take 1 tablet (150 mg total) by mouth 2 (two) times daily. 180 tablet 3  . tamsulosin (FLOMAX) 0.4 MG CAPS capsule TAKE 2 CAPSULES BY MOUTH  DAILY 180 capsule 1  . traMADol (ULTRAM) 50 MG tablet Take 1-2 tablets (50-100 mg total) by mouth 2 (two) times daily as needed for severe pain. 120 tablet 3  . vitamin C (ASCORBIC ACID) 500 MG tablet Take 1 tablet (500 mg total) by mouth daily. 100 tablet 1  . VITAMIN D, ERGOCALCIFEROL, PO Take 1 tablet by mouth daily.     No current facility-administered medications for this visit.     Allergies as of 03/05/2017  . (No Known Allergies)    Vitals: BP (!) 104/55   Pulse 70   Ht 6' (1.829 m)   Wt 241 lb (109.3 kg)   BMI 32.69 kg/m  Last Weight:  Wt Readings from Last 1 Encounters:  03/05/17 241 lb (109.3 kg)   XYV:OPFY mass index is 32.69 kg/m.     Last Height:   Ht Readings from Last 1 Encounters:  03/05/17 6' (1.829 m)    Physical exam:  General: The patient is awake, alert and appears not in acute distress. The patient is well groomed. Head: Normocephalic, atraumatic. Neck is supple. Mallampati 3-4  ,  neck circumference:19. Nasal airflow patent ,  TMJ click evident . Retrognathia is not seen.  Cardiovascular:  Regular rate and rhythm , without  murmurs or carotid bruit, and without distended neck veins. Respiratory: Lungs are clear to auscultation. Skin:  Without evidence of edema, or rash- he is pale and appears icteric.  Trunk: BMI is 33  Neurologic exam : The patient is awake and alert, oriented to place and time.   Memory subjective  described as intact.  Speech is fluent,  without dysarthria, dysphonia or aphasia.  Mood and affect are appropriate.  Cranial nerves: Pupils are equal and briskly reactive to light.  Extraocular  movements  in vertical and horizontal planes intact and without nystagmus. Visual fields by finger perimetry are intact. Hearing to finger rub intact.  Facial sensation intact to fine touch. Facial motor strength is symmetric and tongue and uvula move midline. Shoulder shrug was symmetrical.    The patient was advised of the nature of the OSA= sleep disorder , the treatment options and risks for general a health and wellness arising from not treating the condition.  In addition the untreated obstructive sleep apnea may have let to left ventricular hyper trophy, hypertension or irregular heartbeats. He does not have a history of atrial fibrillation. I spent more than 20 minutes of face to face time with the patient. Mr. Deguia has obstructive sleep apnea and this may also contribute to memory lapses, daytime fatigue, daytime sleepiness. What I can understandably detect with that he is somewhat frustrated with the large air leak and poor mask fit . He had tried to use the CPAP machine during her cruise and had to take a six-day break. Greater than 50% of time was spent in counseling and coordination of care. We have discussed the diagnosis and differential and I answered the patient's questions.     Assessment:   We need a new mask fitting and, if not successful , a change to BiPAP.   The patient has some REM sleep behavior disorder which probably contributes to losing the air seal. We performed a 50 minute refitting session and the patient is doing quite well with a this mask, which covers his nose, size this the XL model, produced by [employer]. He has a fitting hose. He will try this mask for the next 14 days and called me if we need to make further changes   Plan:  Treatment plan and additional workup :  Melissa at [facility] as met with the patient. Current mask is a dream wear mask. He will leave today after being refitted with an WISP nasal in XL.  Rv in 6 month, will call if he  has problems using this mask.      Asencion Partridge Lissandro Dilorenzo MD  03/05/2017   CC: Cassandria Anger, Md [address] [place], [place] [address]

## 2017-03-05 NOTE — Telephone Encounter (Signed)
CONFIRMED 7/23 APPT AT 10 AM PER [facility]

## 2017-03-05 NOTE — Patient Instructions (Signed)

## 2017-03-05 NOTE — Addendum Note (Signed)
Addended by: Larey Seat on: 03/05/2017 02:30 PM   Modules accepted: Orders

## 2017-03-06 DIAGNOSIS — G4733 Obstructive sleep apnea (adult) (pediatric): Secondary | ICD-10-CM | POA: Diagnosis not present

## 2017-03-07 DIAGNOSIS — M79604 Pain in right leg: Secondary | ICD-10-CM | POA: Diagnosis not present

## 2017-03-07 DIAGNOSIS — M48062 Spinal stenosis, lumbar region with neurogenic claudication: Secondary | ICD-10-CM | POA: Diagnosis not present

## 2017-03-07 DIAGNOSIS — M5410 Radiculopathy, site unspecified: Secondary | ICD-10-CM | POA: Diagnosis not present

## 2017-03-07 DIAGNOSIS — M79605 Pain in left leg: Secondary | ICD-10-CM | POA: Diagnosis not present

## 2017-03-12 ENCOUNTER — Telehealth: Payer: Self-pay

## 2017-03-12 NOTE — Telephone Encounter (Signed)
Keep all appt as is When I see him on 7/23, I can write orders to move the second dose to a different date of their choice

## 2017-03-12 NOTE — Telephone Encounter (Signed)
Called wife with below message, verbalized understanding.

## 2017-03-12 NOTE — Telephone Encounter (Signed)
Wife called and left message regarding husbands appts. They are going to be out of town on 7-30 for 2nd iron infusion, per wife they did not know anything about this date. They just saw it on mychart.

## 2017-03-15 ENCOUNTER — Other Ambulatory Visit: Payer: Self-pay | Admitting: Hematology and Oncology

## 2017-03-15 DIAGNOSIS — D500 Iron deficiency anemia secondary to blood loss (chronic): Secondary | ICD-10-CM

## 2017-03-16 ENCOUNTER — Ambulatory Visit (HOSPITAL_BASED_OUTPATIENT_CLINIC_OR_DEPARTMENT_OTHER): Payer: Medicare Other

## 2017-03-16 ENCOUNTER — Other Ambulatory Visit (HOSPITAL_BASED_OUTPATIENT_CLINIC_OR_DEPARTMENT_OTHER): Payer: Medicare Other

## 2017-03-16 ENCOUNTER — Ambulatory Visit (HOSPITAL_BASED_OUTPATIENT_CLINIC_OR_DEPARTMENT_OTHER): Payer: Medicare Other | Admitting: Hematology and Oncology

## 2017-03-16 ENCOUNTER — Telehealth: Payer: Self-pay | Admitting: Hematology and Oncology

## 2017-03-16 VITALS — BP 114/53 | HR 68 | Temp 97.7°F | Resp 19 | Ht 72.0 in | Wt 242.5 lb

## 2017-03-16 DIAGNOSIS — D500 Iron deficiency anemia secondary to blood loss (chronic): Secondary | ICD-10-CM

## 2017-03-16 DIAGNOSIS — K922 Gastrointestinal hemorrhage, unspecified: Secondary | ICD-10-CM | POA: Diagnosis not present

## 2017-03-16 DIAGNOSIS — D509 Iron deficiency anemia, unspecified: Secondary | ICD-10-CM

## 2017-03-16 DIAGNOSIS — K5900 Constipation, unspecified: Secondary | ICD-10-CM

## 2017-03-16 DIAGNOSIS — E538 Deficiency of other specified B group vitamins: Secondary | ICD-10-CM

## 2017-03-16 DIAGNOSIS — K909 Intestinal malabsorption, unspecified: Secondary | ICD-10-CM | POA: Diagnosis not present

## 2017-03-16 LAB — CBC & DIFF AND RETIC
BASO%: 1.3 % (ref 0.0–2.0)
Basophils Absolute: 0.1 10*3/uL (ref 0.0–0.1)
EOS%: 5 % (ref 0.0–7.0)
Eosinophils Absolute: 0.2 10*3/uL (ref 0.0–0.5)
HCT: 28.1 % — ABNORMAL LOW (ref 38.4–49.9)
HGB: 8.4 g/dL — ABNORMAL LOW (ref 13.0–17.1)
Immature Retic Fract: 15.7 % — ABNORMAL HIGH (ref 3.00–10.60)
LYMPH%: 23.1 % (ref 14.0–49.0)
MCH: 21.2 pg — ABNORMAL LOW (ref 27.2–33.4)
MCHC: 30 g/dL — ABNORMAL LOW (ref 32.0–36.0)
MCV: 70.9 fL — ABNORMAL LOW (ref 79.3–98.0)
MONO#: 0.8 10*3/uL (ref 0.1–0.9)
MONO%: 18.6 % — ABNORMAL HIGH (ref 0.0–14.0)
NEUT#: 2.3 10*3/uL (ref 1.5–6.5)
NEUT%: 52 % (ref 39.0–75.0)
Platelets: 223 10*3/uL (ref 140–400)
RBC: 3.97 10*6/uL — ABNORMAL LOW (ref 4.20–5.82)
RDW: 18 % — ABNORMAL HIGH (ref 11.0–14.6)
Retic %: 1.66 % (ref 0.80–1.80)
Retic Ct Abs: 65.9 10*3/uL (ref 34.80–93.90)
WBC: 4.4 10*3/uL (ref 4.0–10.3)
lymph#: 1 10*3/uL (ref 0.9–3.3)

## 2017-03-16 LAB — IRON AND TIBC
%SAT: 4 % — ABNORMAL LOW (ref 20–55)
Iron: 16 ug/dL — ABNORMAL LOW (ref 42–163)
TIBC: 401 ug/dL (ref 202–409)
UIBC: 385 ug/dL — ABNORMAL HIGH (ref 117–376)

## 2017-03-16 LAB — FERRITIN: Ferritin: 9 ng/ml — ABNORMAL LOW (ref 22–316)

## 2017-03-16 MED ORDER — HEPARIN SOD (PORK) LOCK FLUSH 100 UNIT/ML IV SOLN
250.0000 [IU] | Freq: Once | INTRAVENOUS | Status: DC | PRN
  Filled 2017-03-16: qty 5

## 2017-03-16 MED ORDER — SODIUM CHLORIDE 0.9% FLUSH
10.0000 mL | INTRAVENOUS | Status: DC | PRN
  Filled 2017-03-16: qty 10

## 2017-03-16 MED ORDER — SODIUM CHLORIDE 0.9 % IV SOLN: Freq: Once | INTRAVENOUS | Status: DC

## 2017-03-16 MED ORDER — SODIUM CHLORIDE 0.9 % IV SOLN
510.0000 mg | Freq: Once | INTRAVENOUS | Status: AC
  Administered 2017-03-16: 510 mg via INTRAVENOUS
  Filled 2017-03-16: qty 17

## 2017-03-16 MED ORDER — ALTEPLASE 2 MG IJ SOLR
2.0000 mg | Freq: Once | INTRAMUSCULAR | Status: DC | PRN
  Filled 2017-03-16: qty 2

## 2017-03-16 MED ORDER — HEPARIN SOD (PORK) LOCK FLUSH 100 UNIT/ML IV SOLN
500.0000 [IU] | Freq: Once | INTRAVENOUS | Status: DC | PRN
  Filled 2017-03-16: qty 5

## 2017-03-16 MED ORDER — SODIUM CHLORIDE 0.9% FLUSH
3.0000 mL | Freq: Once | INTRAVENOUS | Status: DC | PRN
  Filled 2017-03-16: qty 10

## 2017-03-16 NOTE — Telephone Encounter (Signed)
Gave patient spouse avs report and appointments for August and November

## 2017-03-17 ENCOUNTER — Encounter: Payer: Self-pay | Admitting: Hematology and Oncology

## 2017-03-17 NOTE — Assessment & Plan Note (Signed)
He had history of vitamin B12 deficiency and had received B12 replacement therapy His recent serum vitamin B12 is adequate 

## 2017-03-17 NOTE — Assessment & Plan Note (Signed)
The patient had known history of recurrent iron deficiency anemia, thought to be related to GI bleed He denies NSAID but he remain on antiplatelet agents He tolerated oral iron supplement very poorly. The most likely cause of his anemia is due to chronic blood loss/malabsorption syndrome. We discussed some of the risks, benefits, and alternatives of intravenous iron infusions. The patient is symptomatic from anemia and the iron level is critically low. He tolerated oral iron supplement poorly and desires to achieved higher levels of iron faster for adequate hematopoesis. Some of the side-effects to be expected including risks of infusion reactions, phlebitis, headaches, nausea and fatigue.  The patient is willing to proceed. Patient education material was dispensed.  Goal is to keep ferritin level greater than 50 and resolution of anemia

## 2017-03-17 NOTE — Progress Notes (Signed)
[name]  [name], [name], MD SUMMARY OF HEMATOLOGIC HISTORY:  His baseline blood work in July of 2013 were within normal limits. He was found to have abnormal CBC from blood drawn on 01/24/2014 which showed hemoglobin of 7 with MCV of 77.3. He was also found to have low serum vitamin B12 and he was given vitamin B12 injection. The patient subsequently had progressive fatigue and anemia. He was hospitalized and was given blood transfusions. Upper endoscopy evaluation was normal. A subsequent colonoscopy was also normal. He received iron infusion and is currently taking oral iron supplement. The patient denies over the counter NSAID ingestion. He is on antiplatelets agents. He was given intramuscular vitamin B-12 injection and iron infusion in October 2015.  INTERVAL HISTORY: [name] 76 y.o. male returns for for further follow-up. He complained of fatigue. He is severely constipated due to oral iron supplement and felt sick The patient denies any recent signs or symptoms of bleeding such as spontaneous epistaxis, hematuria or hematochezia. He denies chest pain or shortness of breath.  I have reviewed the past medical history, past surgical history, social history and family history with the patient and they are unchanged from previous note.  ALLERGIES:  has No Known Allergies.  MEDICATIONS:  Current Outpatient Prescriptions  Medication Sig Dispense Refill  . aspirin 81 MG chewable tablet Chew 81 mg by mouth at bedtime.    . Cyanocobalamin (VITAMIN B-12) 1000 MCG SUBL Place 1 tablet (1,000 mcg total) under the tongue daily. 100 tablet 3  . DULoxetine (CYMBALTA) 60 MG capsule TAKE 1 CAPSULE (60 MG TOTAL) BY MOUTH DAILY. 90 capsule 0  . finasteride (PROSCAR) 5 MG tablet TAKE 1 TABLET BY MOUTH  DAILY 90 tablet 1  . folic acid (V-R FOLIC ACID) 960 MCG tablet Take 1 tablet (400 mcg total) by mouth daily. 90 tablet 3  . furosemide (LASIX) 20  MG tablet TAKE 1 TO 2 TABLETS BY  MOUTH DAILY AS NEEDED FOR  EDEMA (Patient taking differently: take 2 tab) 180 tablet 3  . methocarbamol (ROBAXIN) 500 MG tablet Take 1 tablet (500 mg total) by mouth 3 (three) times daily as needed. 60 tablet 1  . nitroGLYCERIN (NITROSTAT) 0.4 MG SL tablet Place 1 tablet (0.4 mg total) under the tongue every 5 (five) minutes as needed for chest pain. 20 tablet 3  . ranitidine (ZANTAC) 150 MG tablet Take 1 tablet (150 mg total) by mouth 2 (two) times daily. 180 tablet 3  . tamsulosin (FLOMAX) 0.4 MG CAPS capsule TAKE 2 CAPSULES BY MOUTH  DAILY 180 capsule 1  . traMADol (ULTRAM) 50 MG tablet Take 1-2 tablets (50-100 mg total) by mouth 2 (two) times daily as needed for severe pain. 120 tablet 3  . vitamin C (ASCORBIC ACID) 500 MG tablet Take 1 tablet (500 mg total) by mouth daily. 100 tablet 1  . VITAMIN D, ERGOCALCIFEROL, PO Take 1 tablet by mouth daily.     No current facility-administered medications for this visit.      REVIEW OF SYSTEMS:   Constitutional: Denies fevers, chills or night sweats Eyes: Denies blurriness of vision Ears, nose, mouth, throat, and face: Denies mucositis or sore throat Respiratory: Denies cough, dyspnea or wheezes Cardiovascular: Denies palpitation, chest discomfort or lower extremity swelling Gastrointestinal:  Denies nausea, heartburn or change in bowel habits Skin: Denies abnormal skin rashes Lymphatics: Denies new lymphadenopathy or easy bruising Neurological:Denies numbness, tingling or new weaknesses Behavioral/Psych: Mood is stable,  no new changes  All other systems were reviewed with the patient and are negative.  PHYSICAL EXAMINATION: ECOG PERFORMANCE STATUS: 1 - Symptomatic but completely ambulatory  Vitals:   03/16/17 1044  BP: (!) 114/53  Pulse: 68  Resp: 19  Temp: 97.7 F (36.5 C)   Filed Weights   03/16/17 1044  Weight: 242 lb 8 oz (110 kg)    GENERAL:alert, no distress and comfortable.  He looks  pale SKIN: skin color, texture, turgor are normal, no rashes or significant lesions EYES: normal, Conjunctiva are pink and non-injected, sclera clear OROPHARYNX:no exudate, no erythema and lips, buccal mucosa, and tongue normal  NECK: supple, thyroid normal size, non-tender, without nodularity LYMPH:  no palpable lymphadenopathy in the cervical, axillary or inguinal LUNGS: clear to auscultation and percussion with normal breathing effort HEART: regular rate & rhythm and no murmurs and no lower extremity edema ABDOMEN:abdomen soft, non-tender and normal bowel sounds Musculoskeletal:no cyanosis of digits and no clubbing  NEURO: alert & oriented x 3 with fluent speech, no focal motor/sensory deficits  LABORATORY DATA:  I have reviewed the data as listed     Component Value Date/Time   NA 142 03/03/2017 0824   NA 140 05/16/2014 1327   K 4.0 03/03/2017 0824   K 3.8 05/16/2014 1327   CL 104 03/03/2017 0824   CO2 29 03/03/2017 0824   CO2 28 05/16/2014 1327   GLUCOSE 105 (H) 03/03/2017 0824   GLUCOSE 98 05/16/2014 1327   BUN 17 03/03/2017 0824   BUN 17.6 05/16/2014 1327   CREATININE 0.81 03/03/2017 0824   CREATININE 0.82 08/13/2015 0953   CREATININE 0.9 05/16/2014 1327   CALCIUM 9.6 03/03/2017 0824   CALCIUM 9.4 05/16/2014 1327   PROT 6.4 08/13/2015 0953   PROT 6.9 05/16/2014 1327   ALBUMIN 4.2 08/13/2015 0953   ALBUMIN 3.7 05/16/2014 1327   AST 17 08/13/2015 0953   AST 19 05/16/2014 1327   ALT 14 08/13/2015 0953   ALT 9 05/16/2014 1327   ALKPHOS 55 08/13/2015 0953   ALKPHOS 66 05/16/2014 1327   BILITOT 0.6 08/13/2015 0953   BILITOT 0.50 05/16/2014 1327   GFRNONAA >60 07/05/2016 0448   GFRNONAA 87 08/13/2015 0953   GFRAA >60 07/05/2016 0448   GFRAA >89 08/13/2015 0953    No results found for: SPEP, UPEP  Lab Results  Component Value Date   WBC 4.4 03/16/2017   NEUTROABS 2.3 03/16/2017   HGB 8.4 (L) 03/16/2017   HCT 28.1 (L) 03/16/2017   MCV 70.9 (L) 03/16/2017    PLT 223 03/16/2017      Chemistry      Component Value Date/Time   NA 142 03/03/2017 0824   NA 140 05/16/2014 1327   K 4.0 03/03/2017 0824   K 3.8 05/16/2014 1327   CL 104 03/03/2017 0824   CO2 29 03/03/2017 0824   CO2 28 05/16/2014 1327   BUN 17 03/03/2017 0824   BUN 17.6 05/16/2014 1327   CREATININE 0.81 03/03/2017 0824   CREATININE 0.82 08/13/2015 0953   CREATININE 0.9 05/16/2014 1327      Component Value Date/Time   CALCIUM 9.6 03/03/2017 0824   CALCIUM 9.4 05/16/2014 1327   ALKPHOS 55 08/13/2015 0953   ALKPHOS 66 05/16/2014 1327   AST 17 08/13/2015 0953   AST 19 05/16/2014 1327   ALT 14 08/13/2015 0953   ALT 9 05/16/2014 1327   BILITOT 0.6 08/13/2015 0953   BILITOT 0.50 05/16/2014 1327  ASSESSMENT & PLAN:  Anemia, iron deficiency The patient had known history of recurrent iron deficiency anemia, thought to be related to GI bleed He denies NSAID but he remain on antiplatelet agents He tolerated oral iron supplement very poorly. The most likely cause of his anemia is due to chronic blood loss/malabsorption syndrome. We discussed some of the risks, benefits, and alternatives of intravenous iron infusions. The patient is symptomatic from anemia and the iron level is critically low. He tolerated oral iron supplement poorly and desires to achieved higher levels of iron faster for adequate hematopoesis. Some of the side-effects to be expected including risks of infusion reactions, phlebitis, headaches, nausea and fatigue.  The patient is willing to proceed. Patient education material was dispensed.  Goal is to keep ferritin level greater than 50 and resolution of anemia   B12 deficiency He had history of vitamin B12 deficiency and had received B12 replacement therapy His recent serum vitamin B12 is adequate   Orders Placed This Encounter  Procedures  . CBC with Differential/Platelet    Standing Status:   Future    Standing Expiration Date:   04/20/2018  .  Ferritin    Standing Status:   Future    Standing Expiration Date:   03/16/2018  . Iron and TIBC    Standing Status:   Future    Standing Expiration Date:   04/20/2018    All questions were answered. The patient knows to call the clinic with any problems, questions or concerns. No barriers to learning was detected.  I spent 15 minutes counseling the patient face to face. The total time spent in the appointment was 20 minutes and more than 50% was on counseling.     Heath Lark, MD 7/24/20186:53 AM

## 2017-03-19 DIAGNOSIS — M25562 Pain in left knee: Secondary | ICD-10-CM | POA: Diagnosis not present

## 2017-03-20 ENCOUNTER — Other Ambulatory Visit: Payer: Self-pay | Admitting: Physical Medicine and Rehabilitation

## 2017-03-20 DIAGNOSIS — M5410 Radiculopathy, site unspecified: Secondary | ICD-10-CM

## 2017-03-23 ENCOUNTER — Ambulatory Visit: Payer: Medicare Other

## 2017-03-31 ENCOUNTER — Ambulatory Visit
Admission: RE | Admit: 2017-03-31 | Discharge: 2017-03-31 | Disposition: A | Discharge status: Home / Self Care | Payer: Medicare Other | Source: Ambulatory Visit | Attending: Physical Medicine and Rehabilitation | Admitting: Physical Medicine and Rehabilitation

## 2017-03-31 DIAGNOSIS — I70213 Atherosclerosis of native arteries of extremities with intermittent claudication, bilateral legs: Secondary | ICD-10-CM | POA: Diagnosis not present

## 2017-03-31 DIAGNOSIS — M5410 Radiculopathy, site unspecified: Secondary | ICD-10-CM

## 2017-04-01 ENCOUNTER — Ambulatory Visit (HOSPITAL_BASED_OUTPATIENT_CLINIC_OR_DEPARTMENT_OTHER): Payer: Medicare Other

## 2017-04-01 VITALS — BP 122/61 | HR 67 | Temp 98.6°F | Resp 18

## 2017-04-01 DIAGNOSIS — D509 Iron deficiency anemia, unspecified: Secondary | ICD-10-CM

## 2017-04-01 DIAGNOSIS — D500 Iron deficiency anemia secondary to blood loss (chronic): Secondary | ICD-10-CM

## 2017-04-01 MED ORDER — SODIUM CHLORIDE 0.9 % IV SOLN
Freq: Once | INTRAVENOUS | Status: AC
  Administered 2017-04-01: 14:00:00 via INTRAVENOUS

## 2017-04-01 MED ORDER — SODIUM CHLORIDE 0.9 % IV SOLN
510.0000 mg | Freq: Once | INTRAVENOUS | Status: AC
  Administered 2017-04-01: 510 mg via INTRAVENOUS
  Filled 2017-04-01: qty 17

## 2017-04-01 NOTE — Patient Instructions (Signed)

## 2017-04-03 DIAGNOSIS — G4733 Obstructive sleep apnea (adult) (pediatric): Secondary | ICD-10-CM | POA: Diagnosis not present

## 2017-04-06 DIAGNOSIS — G4733 Obstructive sleep apnea (adult) (pediatric): Secondary | ICD-10-CM | POA: Diagnosis not present

## 2017-04-09 ENCOUNTER — Other Ambulatory Visit: Payer: Self-pay | Admitting: Internal Medicine

## 2017-04-10 ENCOUNTER — Other Ambulatory Visit (INDEPENDENT_AMBULATORY_CARE_PROVIDER_SITE_OTHER): Payer: Medicare Other

## 2017-04-10 DIAGNOSIS — D509 Iron deficiency anemia, unspecified: Secondary | ICD-10-CM

## 2017-04-10 DIAGNOSIS — I10 Essential (primary) hypertension: Secondary | ICD-10-CM | POA: Diagnosis not present

## 2017-04-10 LAB — CBC WITH DIFFERENTIAL/PLATELET
Basophils Absolute: 0 10*3/uL (ref 0.0–0.1)
Basophils Relative: 1 % (ref 0.0–3.0)
Eosinophils Absolute: 0.2 10*3/uL (ref 0.0–0.7)
Eosinophils Relative: 3.6 % (ref 0.0–5.0)
HCT: 35.1 % — ABNORMAL LOW (ref 39.0–52.0)
Hemoglobin: 10.6 g/dL — ABNORMAL LOW (ref 13.0–17.0)
Lymphocytes Relative: 26.8 % (ref 12.0–46.0)
Lymphs Abs: 1.1 10*3/uL (ref 0.7–4.0)
MCHC: 30.3 g/dL (ref 30.0–36.0)
MCV: 78.9 fl (ref 78.0–100.0)
Monocytes Absolute: 0.6 10*3/uL (ref 0.1–1.0)
Monocytes Relative: 14.4 % — ABNORMAL HIGH (ref 3.0–12.0)
Neutro Abs: 2.3 10*3/uL (ref 1.4–7.7)
Neutrophils Relative %: 54.2 % (ref 43.0–77.0)
Platelets: 214 10*3/uL (ref 150.0–400.0)
RBC: 4.45 Mil/uL (ref 4.22–5.81)
RDW: 27.4 % — ABNORMAL HIGH (ref 11.5–15.5)
WBC: 4.2 10*3/uL (ref 4.0–10.5)

## 2017-04-10 LAB — BASIC METABOLIC PANEL
BUN: 15 mg/dL (ref 6–23)
CO2: 33 mEq/L — ABNORMAL HIGH (ref 19–32)
Calcium: 9.6 mg/dL (ref 8.4–10.5)
Chloride: 103 mEq/L (ref 96–112)
Creatinine, Ser: 0.79 mg/dL (ref 0.40–1.50)
GFR: 101.33 mL/min (ref >60.00)
Glucose, Bld: 122 mg/dL — ABNORMAL HIGH (ref 70–99)
Potassium: 4.5 mEq/L (ref 3.5–5.1)
Sodium: 142 mEq/L (ref 135–145)

## 2017-04-10 LAB — IBC PANEL
Iron: 40 ug/dL — ABNORMAL LOW (ref 42–165)
Saturation Ratios: 10.4 % — ABNORMAL LOW (ref 20.0–50.0)
Transferrin: 274 mg/dL (ref 212.0–360.0)

## 2017-04-11 LAB — DIFFERENTIAL
Basophils Absolute: 44 cells/uL (ref 0–200)
Basophils Relative: 1 %
Eosinophils Absolute: 132 cells/uL (ref 15–500)
Eosinophils Relative: 3 %
Lymphocytes Relative: 26 %
Lymphs Abs: 1144 cells/uL (ref 850–3900)
Monocytes Absolute: 660 cells/uL (ref 200–950)
Monocytes Relative: 15 %
Neutro Abs: 2420 cells/uL (ref 1500–7800)
Neutrophils Relative %: 55 %

## 2017-04-15 ENCOUNTER — Encounter: Payer: Self-pay | Admitting: Internal Medicine

## 2017-04-15 ENCOUNTER — Ambulatory Visit (INDEPENDENT_AMBULATORY_CARE_PROVIDER_SITE_OTHER): Payer: Medicare Other | Admitting: Internal Medicine

## 2017-04-15 VITALS — BP 120/68 | HR 56 | Temp 97.9°F | Ht 72.0 in | Wt 236.0 lb

## 2017-04-15 DIAGNOSIS — E785 Hyperlipidemia, unspecified: Secondary | ICD-10-CM | POA: Diagnosis not present

## 2017-04-15 DIAGNOSIS — Z23 Encounter for immunization: Secondary | ICD-10-CM | POA: Diagnosis not present

## 2017-04-15 MED ORDER — PRAVASTATIN SODIUM 20 MG PO TABS: 20.0000 mg | ORAL_TABLET | Freq: Every day | ORAL | 3 refills | Status: DC

## 2017-04-15 NOTE — Addendum Note (Signed)
Addended by: Karren Cobble on: 04/15/2017 10:26 AM   Modules accepted: Orders

## 2017-04-15 NOTE — Assessment & Plan Note (Signed)
8/18 Pravachol

## 2017-04-15 NOTE — Progress Notes (Signed)
Subjective:  Patient ID: [name], male    DOB: 25-Jun-1941  Age: 76 y.o. MRN: [id number]  CC: No chief complaint on file.   HPI [name] presents for leg weakness - better w/IV iron, off statin. F/u anemia, CAD.  Outpatient Medications Prior to Visit  Medication Sig Dispense Refill  . aspirin 81 MG chewable tablet Chew 81 mg by mouth at bedtime.    . Cyanocobalamin (VITAMIN B-12) 1000 MCG SUBL Place 1 tablet (1,000 mcg total) under the tongue daily. 100 tablet 3  . DULoxetine (CYMBALTA) 60 MG capsule TAKE 1 CAPSULE BY MOUTH  DAILY 90 capsule 1  . finasteride (PROSCAR) 5 MG tablet TAKE 1 TABLET BY MOUTH  DAILY 90 tablet 1  . folic acid (V-R FOLIC ACID) 476 MCG tablet Take 1 tablet (400 mcg total) by mouth daily. 90 tablet 3  . furosemide (LASIX) 20 MG tablet TAKE 1 TO 2 TABLETS BY  MOUTH DAILY AS NEEDED FOR  EDEMA (Patient taking differently: take 2 tab) 180 tablet 3  . methocarbamol (ROBAXIN) 500 MG tablet Take 1 tablet (500 mg total) by mouth 3 (three) times daily as needed. 60 tablet 1  . nitroGLYCERIN (NITROSTAT) 0.4 MG SL tablet Place 1 tablet (0.4 mg total) under the tongue every 5 (five) minutes as needed for chest pain. 20 tablet 3  . ranitidine (ZANTAC) 150 MG tablet Take 1 tablet (150 mg total) by mouth 2 (two) times daily. 180 tablet 3  . tamsulosin (FLOMAX) 0.4 MG CAPS capsule TAKE 2 CAPSULES BY MOUTH  DAILY 180 capsule 1  . traMADol (ULTRAM) 50 MG tablet Take 1-2 tablets (50-100 mg total) by mouth 2 (two) times daily as needed for severe pain. 120 tablet 3  . vitamin C (ASCORBIC ACID) 500 MG tablet Take 1 tablet (500 mg total) by mouth daily. 100 tablet 1  . VITAMIN D, ERGOCALCIFEROL, PO Take 1 tablet by mouth daily.     No facility-administered medications prior to visit.     ROS Review of Systems  Constitutional: Negative for appetite change, fatigue and unexpected weight change.  HENT: Negative for congestion, nosebleeds, sneezing, sore throat and  trouble swallowing.   Eyes: Negative for itching and visual disturbance.  Respiratory: Negative for cough.   Cardiovascular: Negative for chest pain, palpitations and leg swelling.  Gastrointestinal: Negative for abdominal distention, blood in stool, diarrhea and nausea.  Genitourinary: Negative for frequency and hematuria.  Musculoskeletal: Positive for arthralgias, back pain and gait problem. Negative for joint swelling and neck pain.  Skin: Negative for rash.  Neurological: Negative for dizziness, tremors, speech difficulty and weakness.  Psychiatric/Behavioral: Negative for agitation, dysphoric mood and sleep disturbance. The patient is not nervous/anxious.     Objective:  BP 120/68 (BP Location: Left Arm, Patient Position: Sitting, Cuff Size: Large)   Pulse (!) 56   Temp 97.9 F (36.6 C) (Oral)   Ht 6' (1.829 m)   Wt 236 lb (107 kg)   SpO2 98%   BMI 32.01 kg/m   BP Readings from Last 3 Encounters:  04/15/17 120/68  04/01/17 122/61  03/16/17 (!) 114/53    Wt Readings from Last 3 Encounters:  04/15/17 236 lb (107 kg)  03/16/17 242 lb 8 oz (110 kg)  03/05/17 241 lb (109.3 kg)    Physical Exam  Constitutional: He is oriented to person, place, and time. He appears well-developed. No distress.  NAD  HENT:  Mouth/Throat: Oropharynx is clear and moist.  Eyes: Pupils  are equal, round, and reactive to light. Conjunctivae are normal.  Neck: Normal range of motion. No JVD present. No thyromegaly present.  Cardiovascular: Normal rate, regular rhythm, normal heart sounds and intact distal pulses.  Exam reveals no gallop and no friction rub.   No murmur heard. Pulmonary/Chest: Effort normal and breath sounds normal. No respiratory distress. He has no wheezes. He has no rales. He exhibits no tenderness.  Abdominal: Soft. Bowel sounds are normal. He exhibits no distension and no mass. There is no tenderness. There is no rebound and no guarding.  Musculoskeletal: Normal range of  motion. He exhibits tenderness. He exhibits no edema.  Lymphadenopathy:    He has no cervical adenopathy.  Neurological: He is alert and oriented to person, place, and time. He has normal reflexes. No cranial nerve deficit. He exhibits normal muscle tone. He displays a negative Romberg sign. Coordination and gait normal.  Skin: Skin is warm and dry. No rash noted.  Psychiatric: He has a normal mood and affect. His behavior is normal. Judgment and thought content normal.    Lab Results  Component Value Date   WBC 4.2 04/10/2017   HGB 10.6 (L) 04/10/2017   HCT 35.1 (L) 04/10/2017   PLT 214.0 04/10/2017   GLUCOSE 122 (H) 04/10/2017   CHOL 139 02/22/2016   TRIG 74.0 02/22/2016   HDL 51.70 02/22/2016   LDLDIRECT 157.5 07/05/2008   LDLCALC 73 02/22/2016   ALT 14 08/13/2015   AST 17 08/13/2015   NA 142 04/10/2017   K 4.5 04/10/2017   CL 103 04/10/2017   CREATININE 0.79 04/10/2017   BUN 15 04/10/2017   CO2 33 (H) 04/10/2017   TSH 0.831 08/13/2015   PSA 0.49 03/01/2012   INR 1.06 08/13/2015    [place] Low Ext Art Bil W/exercise  Result Date: 03/31/2017 CLINICAL DATA:  Bilateral lower extremity claudication EXAM: NONINVASIVE PHYSIOLOGIC VASCULAR STUDY OF BILATERAL LOWER EXTREMITIES WITH AND WITHOUT EXERCISE TECHNIQUE: Evaluation of both lower extremities were performed at rest, including calculation of ankle-brachial indices, multiple segmental pressure evaluation, segmental Doppler and segmental pulse volume recording. Ankle brachial indices were also obtained following treadmill exercise. COMPARISON:  None. FINDINGS: RESTING Right ABI: 1.2 Left ABI: 0.2 Right lower extremity: Triphasic Doppler waveforms throughout Left lower extremity: Triphasic Doppler waveforms throughout POST EXERCISE Right ABI: 1.1 Left ABI: 1.2 IMPRESSION: No significant arterial occlusive disease in the lower extremities. Electronically Signed   By: Marybelle Killings M.D.   On: 03/31/2017 09:36    Assessment & Plan:    There are no diagnoses linked to this encounter. I am having [name] maintain his Vitamin E-56, aspirin, folic acid, nitroGLYCERIN, vitamin C, (VITAMIN D, ERGOCALCIFEROL, PO), methocarbamol, traMADol, furosemide, ranitidine, finasteride, DULoxetine, and tamsulosin.  No orders of the defined types were placed in this encounter.    Follow-up: No Follow-up on file.  [name], MD

## 2017-04-22 DIAGNOSIS — M5136 Other intervertebral disc degeneration, lumbar region: Secondary | ICD-10-CM | POA: Diagnosis not present

## 2017-04-22 DIAGNOSIS — M5410 Radiculopathy, site unspecified: Secondary | ICD-10-CM | POA: Diagnosis not present

## 2017-05-04 DIAGNOSIS — M1712 Unilateral primary osteoarthritis, left knee: Secondary | ICD-10-CM | POA: Diagnosis not present

## 2017-05-07 DIAGNOSIS — G4733 Obstructive sleep apnea (adult) (pediatric): Secondary | ICD-10-CM | POA: Diagnosis not present

## 2017-05-13 DIAGNOSIS — M1712 Unilateral primary osteoarthritis, left knee: Secondary | ICD-10-CM | POA: Diagnosis not present

## 2017-05-21 DIAGNOSIS — M25562 Pain in left knee: Secondary | ICD-10-CM | POA: Diagnosis not present

## 2017-05-26 DIAGNOSIS — M48062 Spinal stenosis, lumbar region with neurogenic claudication: Secondary | ICD-10-CM | POA: Diagnosis not present

## 2017-05-27 DIAGNOSIS — S83282A Other tear of lateral meniscus, current injury, left knee, initial encounter: Secondary | ICD-10-CM | POA: Diagnosis not present

## 2017-05-27 DIAGNOSIS — S83242A Other tear of medial meniscus, current injury, left knee, initial encounter: Secondary | ICD-10-CM | POA: Diagnosis not present

## 2017-05-27 DIAGNOSIS — M1712 Unilateral primary osteoarthritis, left knee: Secondary | ICD-10-CM | POA: Diagnosis not present

## 2017-06-06 DIAGNOSIS — G4733 Obstructive sleep apnea (adult) (pediatric): Secondary | ICD-10-CM | POA: Diagnosis not present

## 2017-07-02 ENCOUNTER — Other Ambulatory Visit: Payer: Self-pay | Admitting: Hematology and Oncology

## 2017-07-02 ENCOUNTER — Other Ambulatory Visit (HOSPITAL_BASED_OUTPATIENT_CLINIC_OR_DEPARTMENT_OTHER): Payer: Medicare Other

## 2017-07-02 DIAGNOSIS — D509 Iron deficiency anemia, unspecified: Secondary | ICD-10-CM | POA: Diagnosis not present

## 2017-07-02 DIAGNOSIS — D500 Iron deficiency anemia secondary to blood loss (chronic): Secondary | ICD-10-CM

## 2017-07-02 LAB — CBC WITH DIFFERENTIAL/PLATELET
BASO%: 0.9 % (ref 0.0–2.0)
Basophils Absolute: 0 10*3/uL (ref 0.0–0.1)
EOS%: 3.5 % (ref 0.0–7.0)
Eosinophils Absolute: 0.2 10*3/uL (ref 0.0–0.5)
HCT: 36.7 % — ABNORMAL LOW (ref 38.4–49.9)
HGB: 11.1 g/dL — ABNORMAL LOW (ref 13.0–17.1)
LYMPH%: 28.7 % (ref 14.0–49.0)
MCH: 24.8 pg — ABNORMAL LOW (ref 27.2–33.4)
MCHC: 30.2 g/dL — ABNORMAL LOW (ref 32.0–36.0)
MCV: 81.9 fL (ref 79.3–98.0)
MONO#: 0.7 10*3/uL (ref 0.1–0.9)
MONO%: 17.1 % — ABNORMAL HIGH (ref 0.0–14.0)
NEUT#: 2.2 10*3/uL (ref 1.5–6.5)
NEUT%: 49.8 % (ref 39.0–75.0)
Platelets: 220 10*3/uL (ref 140–400)
RBC: 4.48 10*6/uL (ref 4.20–5.82)
RDW: 17.8 % — ABNORMAL HIGH (ref 11.0–14.6)
WBC: 4.3 10*3/uL (ref 4.0–10.3)
lymph#: 1.2 10*3/uL (ref 0.9–3.3)

## 2017-07-02 LAB — IRON AND TIBC
%SAT: 5 % — ABNORMAL LOW (ref 20–55)
Iron: 20 ug/dL — ABNORMAL LOW (ref 42–163)
TIBC: 431 ug/dL — ABNORMAL HIGH (ref 202–409)
UIBC: 411 ug/dL — ABNORMAL HIGH (ref 117–376)

## 2017-07-02 LAB — FERRITIN: Ferritin: 8 ng/ml — ABNORMAL LOW (ref 22–316)

## 2017-07-03 DIAGNOSIS — S83282D Other tear of lateral meniscus, current injury, left knee, subsequent encounter: Secondary | ICD-10-CM | POA: Diagnosis not present

## 2017-07-03 DIAGNOSIS — M1712 Unilateral primary osteoarthritis, left knee: Secondary | ICD-10-CM | POA: Diagnosis not present

## 2017-07-03 DIAGNOSIS — S83242D Other tear of medial meniscus, current injury, left knee, subsequent encounter: Secondary | ICD-10-CM | POA: Diagnosis not present

## 2017-07-07 DIAGNOSIS — G4733 Obstructive sleep apnea (adult) (pediatric): Secondary | ICD-10-CM | POA: Diagnosis not present

## 2017-07-09 ENCOUNTER — Ambulatory Visit (HOSPITAL_BASED_OUTPATIENT_CLINIC_OR_DEPARTMENT_OTHER): Payer: Medicare Other

## 2017-07-09 ENCOUNTER — Encounter: Payer: Self-pay | Admitting: Hematology and Oncology

## 2017-07-09 ENCOUNTER — Ambulatory Visit: Payer: Medicare Other | Admitting: Hematology and Oncology

## 2017-07-09 ENCOUNTER — Telehealth: Payer: Self-pay | Admitting: Hematology and Oncology

## 2017-07-09 VITALS — BP 130/69 | HR 57 | Resp 17

## 2017-07-09 DIAGNOSIS — D509 Iron deficiency anemia, unspecified: Secondary | ICD-10-CM

## 2017-07-09 DIAGNOSIS — D500 Iron deficiency anemia secondary to blood loss (chronic): Secondary | ICD-10-CM

## 2017-07-09 DIAGNOSIS — E538 Deficiency of other specified B group vitamins: Secondary | ICD-10-CM

## 2017-07-09 MED ORDER — FERUMOXYTOL INJECTION 510 MG/17 ML
510.0000 mg | Freq: Once | INTRAVENOUS | Status: AC
  Administered 2017-07-09: 510 mg via INTRAVENOUS
  Filled 2017-07-09: qty 17

## 2017-07-09 NOTE — Patient Instructions (Signed)

## 2017-07-09 NOTE — Telephone Encounter (Signed)
Gave avs and calendar for February 2019 °

## 2017-07-09 NOTE — Progress Notes (Signed)
[facility] OFFICE PROGRESS NOTE  [name], [name], MD SUMMARY OF HEMATOLOGIC HISTORY:  His baseline blood work in July of 2013 were within normal limits. He was found to have abnormal CBC from blood drawn on 01/24/2014 which showed hemoglobin of 7 with MCV of 77.3. He was also found to have low serum vitamin B12 and he was given vitamin B12 injection. The patient subsequently had progressive fatigue and anemia. He was hospitalized and was given blood transfusions. Upper endoscopy evaluation was normal. A subsequent colonoscopy was also normal. He received iron infusion and is currently taking oral iron supplement. The patient denies over the counter NSAID ingestion. He is on antiplatelets agents. He was given intramuscular vitamin B-12 injection and iron infusion in October 2015. The patient have recurrence of iron deficiency anemia and started to receive IV iron again in 2018  INTERVAL HISTORY: [name] 76 y.o. male returns for further follow-up. He felt more energetic after recent iron infusion The patient denies any recent signs or symptoms of bleeding such as spontaneous epistaxis, hematuria or hematochezia. He denies recent chest pain, shortness of breath or pica He denies recent NSAID  I have reviewed the past medical history, past surgical history, social history and family history with the patient and they are unchanged from previous note.  ALLERGIES:  has No Known Allergies.  MEDICATIONS:  Current Outpatient Medications  Medication Sig Dispense Refill  . aspirin 81 MG chewable tablet Chew 81 mg by mouth at bedtime.    . Cyanocobalamin (VITAMIN B-12) 1000 MCG SUBL Place 1 tablet (1,000 mcg total) under the tongue daily. 100 tablet 3  . DULoxetine (CYMBALTA) 60 MG capsule TAKE 1 CAPSULE BY MOUTH  DAILY 90 capsule 1  . finasteride (PROSCAR) 5 MG tablet TAKE 1 TABLET BY MOUTH  DAILY 90 tablet 1  . folic acid (V-R FOLIC ACID) 161 MCG tablet Take 1 tablet  (400 mcg total) by mouth daily. 90 tablet 3  . furosemide (LASIX) 20 MG tablet TAKE 1 TO 2 TABLETS BY  MOUTH DAILY AS NEEDED FOR  EDEMA (Patient taking differently: take 2 tab) 180 tablet 3  . methocarbamol (ROBAXIN) 500 MG tablet Take 1 tablet (500 mg total) by mouth 3 (three) times daily as needed. 60 tablet 1  . nitroGLYCERIN (NITROSTAT) 0.4 MG SL tablet Place 1 tablet (0.4 mg total) under the tongue every 5 (five) minutes as needed for chest pain. 20 tablet 3  . pravastatin (PRAVACHOL) 20 MG tablet Take 1 tablet (20 mg total) by mouth daily. 90 tablet 3  . ranitidine (ZANTAC) 150 MG tablet Take 1 tablet (150 mg total) by mouth 2 (two) times daily. 180 tablet 3  . tamsulosin (FLOMAX) 0.4 MG CAPS capsule TAKE 2 CAPSULES BY MOUTH  DAILY 180 capsule 1  . traMADol (ULTRAM) 50 MG tablet Take 1-2 tablets (50-100 mg total) by mouth 2 (two) times daily as needed for severe pain. 120 tablet 3  . vitamin C (ASCORBIC ACID) 500 MG tablet Take 1 tablet (500 mg total) by mouth daily. 100 tablet 1  . VITAMIN D, ERGOCALCIFEROL, PO Take 1 tablet by mouth daily.     No current facility-administered medications for this visit.      REVIEW OF SYSTEMS:   Constitutional: Denies fevers, chills or night sweats Eyes: Denies blurriness of vision Ears, nose, mouth, throat, and face: Denies mucositis or sore throat Respiratory: Denies cough, dyspnea or wheezes Cardiovascular: Denies palpitation, chest discomfort or lower extremity swelling Gastrointestinal:  Denies nausea, heartburn or change in bowel habits Skin: Denies abnormal skin rashes Lymphatics: Denies new lymphadenopathy or easy bruising Neurological:Denies numbness, tingling or new weaknesses Behavioral/Psych: Mood is stable, no new changes  All other systems were reviewed with the patient and are negative.  PHYSICAL EXAMINATION: ECOG PERFORMANCE STATUS: 1 - Symptomatic but completely ambulatory  Vitals:   07/09/17 0916  BP: (!) 118/52  Pulse: 67   Resp: 16  Temp: 98 F (36.7 C)  SpO2: 100%   Filed Weights   07/09/17 0916  Weight: 245 lb 1.6 oz (111.2 kg)    GENERAL:alert, no distress and comfortable SKIN: skin color, texture, turgor are normal, no rashes or significant lesions EYES: normal, Conjunctiva are pink and non-injected, sclera clear Musculoskeletal:no cyanosis of digits and no clubbing  NEURO: alert & oriented x 3 with fluent speech, no focal motor/sensory deficits  LABORATORY DATA:  I have reviewed the data as listed     Component Value Date/Time   NA 142 04/10/2017 0952   NA 140 05/16/2014 1327   K 4.5 04/10/2017 0952   K 3.8 05/16/2014 1327   CL 103 04/10/2017 0952   CO2 33 (H) 04/10/2017 0952   CO2 28 05/16/2014 1327   GLUCOSE 122 (H) 04/10/2017 0952   GLUCOSE 98 05/16/2014 1327   BUN 15 04/10/2017 0952   BUN 17.6 05/16/2014 1327   CREATININE 0.79 04/10/2017 0952   CREATININE 0.82 08/13/2015 0953   CREATININE 0.9 05/16/2014 1327   CALCIUM 9.6 04/10/2017 0952   CALCIUM 9.4 05/16/2014 1327   PROT 6.4 08/13/2015 0953   PROT 6.9 05/16/2014 1327   ALBUMIN 4.2 08/13/2015 0953   ALBUMIN 3.7 05/16/2014 1327   AST 17 08/13/2015 0953   AST 19 05/16/2014 1327   ALT 14 08/13/2015 0953   ALT 9 05/16/2014 1327   ALKPHOS 55 08/13/2015 0953   ALKPHOS 66 05/16/2014 1327   BILITOT 0.6 08/13/2015 0953   BILITOT 0.50 05/16/2014 1327   GFRNONAA >60 07/05/2016 0448   GFRNONAA 87 08/13/2015 0953   GFRAA >60 07/05/2016 0448   GFRAA >89 08/13/2015 0953    No results found for: SPEP, UPEP  Lab Results  Component Value Date   WBC 4.3 07/02/2017   NEUTROABS 2.2 07/02/2017   HGB 11.1 (L) 07/02/2017   HCT 36.7 (L) 07/02/2017   MCV 81.9 07/02/2017   PLT 220 07/02/2017      Chemistry      Component Value Date/Time   NA 142 04/10/2017 0952   NA 140 05/16/2014 1327   K 4.5 04/10/2017 0952   K 3.8 05/16/2014 1327   CL 103 04/10/2017 0952   CO2 33 (H) 04/10/2017 0952   CO2 28 05/16/2014 1327   BUN 15  04/10/2017 0952   BUN 17.6 05/16/2014 1327   CREATININE 0.79 04/10/2017 0952   CREATININE 0.82 08/13/2015 0953   CREATININE 0.9 05/16/2014 1327      Component Value Date/Time   CALCIUM 9.6 04/10/2017 0952   CALCIUM 9.4 05/16/2014 1327   ALKPHOS 55 08/13/2015 0953   ALKPHOS 66 05/16/2014 1327   AST 17 08/13/2015 0953   AST 19 05/16/2014 1327   ALT 14 08/13/2015 0953   ALT 9 05/16/2014 1327   BILITOT 0.6 08/13/2015 0953   BILITOT 0.50 05/16/2014 1327     ASSESSMENT & PLAN:  Anemia, iron deficiency The patient had known history of recurrent iron deficiency anemia, thought to be related to GI bleed He denies NSAID but he remain on  antiplatelet agents He tolerated oral iron supplement very poorly. The most likely cause of his anemia is due to chronic blood loss/malabsorption syndrome. We discussed some of the risks, benefits, and alternatives of intravenous iron infusions. The patient is symptomatic from anemia and the iron level is critically low. He tolerated oral iron supplement poorly and desires to achieved higher levels of iron faster for adequate hematopoesis. Some of the side-effects to be expected including risks of infusion reactions, phlebitis, headaches, nausea and fatigue.  The patient is willing to proceed. Patient education material was dispensed.  Goal is to keep ferritin level greater than 50 and resolution of anemia I plan to recheck his blood work again in 3 months  B12 deficiency He had history of vitamin B12 deficiency and had received B12 replacement therapy His recent serum vitamin B12 is adequate   No orders of the defined types were placed in this encounter.   All questions were answered. The patient knows to call the clinic with any problems, questions or concerns. No barriers to learning was detected.  I spent 10 minutes counseling the patient face to face. The total time spent in the appointment was 15 minutes and more than 50% was on counseling.      Heath Lark, MD 11/15/201811:38 AM

## 2017-07-09 NOTE — Assessment & Plan Note (Signed)
He had history of vitamin B12 deficiency and had received B12 replacement therapy His recent serum vitamin B12 is adequate

## 2017-07-09 NOTE — Assessment & Plan Note (Signed)
The patient had known history of recurrent iron deficiency anemia, thought to be related to GI bleed He denies NSAID but he remain on antiplatelet agents He tolerated oral iron supplement very poorly. The most likely cause of his anemia is due to chronic blood loss/malabsorption syndrome. We discussed some of the risks, benefits, and alternatives of intravenous iron infusions. The patient is symptomatic from anemia and the iron level is critically low. He tolerated oral iron supplement poorly and desires to achieved higher levels of iron faster for adequate hematopoesis. Some of the side-effects to be expected including risks of infusion reactions, phlebitis, headaches, nausea and fatigue.  The patient is willing to proceed. Patient education material was dispensed.  Goal is to keep ferritin level greater than 50 and resolution of anemia I plan to recheck his blood work again in 3 months

## 2017-07-16 HISTORY — PX: OTHER SURGICAL HISTORY: SHX169

## 2017-07-21 DIAGNOSIS — S83272A Complex tear of lateral meniscus, current injury, left knee, initial encounter: Secondary | ICD-10-CM | POA: Diagnosis not present

## 2017-07-21 DIAGNOSIS — S83242A Other tear of medial meniscus, current injury, left knee, initial encounter: Secondary | ICD-10-CM | POA: Diagnosis not present

## 2017-07-21 DIAGNOSIS — M94262 Chondromalacia, left knee: Secondary | ICD-10-CM | POA: Diagnosis not present

## 2017-07-21 DIAGNOSIS — M2242 Chondromalacia patellae, left knee: Secondary | ICD-10-CM | POA: Diagnosis not present

## 2017-07-21 DIAGNOSIS — S83232A Complex tear of medial meniscus, current injury, left knee, initial encounter: Secondary | ICD-10-CM | POA: Diagnosis not present

## 2017-07-21 DIAGNOSIS — G8918 Other acute postprocedural pain: Secondary | ICD-10-CM | POA: Diagnosis not present

## 2017-07-21 DIAGNOSIS — S83282A Other tear of lateral meniscus, current injury, left knee, initial encounter: Secondary | ICD-10-CM | POA: Diagnosis not present

## 2017-07-29 DIAGNOSIS — M25562 Pain in left knee: Secondary | ICD-10-CM | POA: Diagnosis not present

## 2017-07-29 DIAGNOSIS — S83242D Other tear of medial meniscus, current injury, left knee, subsequent encounter: Secondary | ICD-10-CM | POA: Diagnosis not present

## 2017-08-05 DIAGNOSIS — M25562 Pain in left knee: Secondary | ICD-10-CM | POA: Diagnosis not present

## 2017-08-06 DIAGNOSIS — G4733 Obstructive sleep apnea (adult) (pediatric): Secondary | ICD-10-CM | POA: Diagnosis not present

## 2017-08-11 DIAGNOSIS — G4733 Obstructive sleep apnea (adult) (pediatric): Secondary | ICD-10-CM | POA: Diagnosis not present

## 2017-08-14 ENCOUNTER — Encounter: Payer: Self-pay | Admitting: Internal Medicine

## 2017-08-14 ENCOUNTER — Ambulatory Visit (INDEPENDENT_AMBULATORY_CARE_PROVIDER_SITE_OTHER): Payer: Medicare Other | Admitting: Internal Medicine

## 2017-08-14 ENCOUNTER — Other Ambulatory Visit (INDEPENDENT_AMBULATORY_CARE_PROVIDER_SITE_OTHER): Payer: Medicare Other

## 2017-08-14 ENCOUNTER — Ambulatory Visit: Payer: Medicare Other | Admitting: Internal Medicine

## 2017-08-14 ENCOUNTER — Telehealth: Payer: Self-pay

## 2017-08-14 VITALS — BP 118/72 | HR 60 | Temp 97.7°F | Ht 72.0 in | Wt 244.0 lb

## 2017-08-14 DIAGNOSIS — G8929 Other chronic pain: Secondary | ICD-10-CM | POA: Diagnosis not present

## 2017-08-14 DIAGNOSIS — D509 Iron deficiency anemia, unspecified: Secondary | ICD-10-CM

## 2017-08-14 DIAGNOSIS — M25562 Pain in left knee: Secondary | ICD-10-CM

## 2017-08-14 DIAGNOSIS — E538 Deficiency of other specified B group vitamins: Secondary | ICD-10-CM

## 2017-08-14 DIAGNOSIS — Z01818 Encounter for other preprocedural examination: Secondary | ICD-10-CM | POA: Diagnosis not present

## 2017-08-14 DIAGNOSIS — I10 Essential (primary) hypertension: Secondary | ICD-10-CM

## 2017-08-14 DIAGNOSIS — M25569 Pain in unspecified knee: Secondary | ICD-10-CM

## 2017-08-14 DIAGNOSIS — D500 Iron deficiency anemia secondary to blood loss (chronic): Secondary | ICD-10-CM

## 2017-08-14 DIAGNOSIS — R531 Weakness: Secondary | ICD-10-CM | POA: Diagnosis not present

## 2017-08-14 DIAGNOSIS — M25561 Pain in right knee: Secondary | ICD-10-CM

## 2017-08-14 DIAGNOSIS — E785 Hyperlipidemia, unspecified: Secondary | ICD-10-CM | POA: Diagnosis not present

## 2017-08-14 LAB — BASIC METABOLIC PANEL
BUN: 15 mg/dL (ref 6–23)
CO2: 31 mEq/L (ref 19–32)
Calcium: 9.4 mg/dL (ref 8.4–10.5)
Chloride: 103 mEq/L (ref 96–112)
Creatinine, Ser: 0.79 mg/dL (ref 0.40–1.50)
GFR: 101.24 mL/min (ref >60.00)
Glucose, Bld: 88 mg/dL (ref 70–99)
Potassium: 4.6 mEq/L (ref 3.5–5.1)
Sodium: 140 mEq/L (ref 135–145)

## 2017-08-14 LAB — IBC PANEL
Iron: 22 ug/dL — ABNORMAL LOW (ref 42–165)
Saturation Ratios: 4.8 % — ABNORMAL LOW (ref 20.0–50.0)
Transferrin: 325 mg/dL (ref 212.0–360.0)

## 2017-08-14 LAB — CBC WITH DIFFERENTIAL/PLATELET
Basophils Absolute: 0 10*3/uL (ref 0.0–0.1)
Basophils Relative: 0.9 % (ref 0.0–3.0)
Eosinophils Absolute: 0.1 10*3/uL (ref 0.0–0.7)
Eosinophils Relative: 3.2 % (ref 0.0–5.0)
HCT: 32.6 % — ABNORMAL LOW (ref 39.0–52.0)
Hemoglobin: 10.3 g/dL — ABNORMAL LOW (ref 13.0–17.0)
Lymphocytes Relative: 22.1 % (ref 12.0–46.0)
Lymphs Abs: 1 10*3/uL (ref 0.7–4.0)
MCHC: 31.5 g/dL (ref 30.0–36.0)
MCV: 83.1 fl (ref 78.0–100.0)
Monocytes Absolute: 0.8 10*3/uL (ref 0.1–1.0)
Monocytes Relative: 18.6 % — ABNORMAL HIGH (ref 3.0–12.0)
Neutro Abs: 2.4 10*3/uL (ref 1.4–7.7)
Neutrophils Relative %: 55.2 % (ref 43.0–77.0)
Platelets: 229 10*3/uL (ref 150.0–400.0)
RBC: 3.92 Mil/uL — ABNORMAL LOW (ref 4.22–5.81)
RDW: 21.4 % — ABNORMAL HIGH (ref 11.5–15.5)
WBC: 4.4 10*3/uL (ref 4.0–10.5)

## 2017-08-14 NOTE — Assessment & Plan Note (Signed)
Labs

## 2017-08-14 NOTE — Assessment & Plan Note (Signed)
CBC

## 2017-08-14 NOTE — Telephone Encounter (Signed)
-----   Message from Heath Lark, MD sent at 08/14/2017  2:18 PM EST ----- Regarding: abnormal Iron study Please call him and let him know he needs IV iron again. Can you ask him what day/time work best for him to be here? Early January ----- Message ----- From: Interface, Lab In Three Zero One Sent: 08/14/2017   1:28 PM To: Heath Lark, MD

## 2017-08-14 NOTE — Telephone Encounter (Signed)
I will place scheduling msg 

## 2017-08-14 NOTE — Assessment & Plan Note (Signed)
Planning to have a L TKR in Jan 2019 R s/p TKR

## 2017-08-14 NOTE — Telephone Encounter (Signed)
Called with below message. Available any day except 1/8 or 1/9, prefers morning times.

## 2017-08-14 NOTE — Progress Notes (Signed)
Subjective:  Patient ID: [name], male    DOB: 11-Jun-1941  Age: 76 y.o. MRN: [id number]  CC: No chief complaint on file.   HPI   IM consult  [name] presents for surg clearance Req by Dr Theda Sers Reason: The pt is planning to have a L TKR in Jan 2019 Hx: OA, CAD, HTN and anemia - stable    Past Medical History:  Diagnosis Date  . Anemia   . Blood transfusion without reported diagnosis 02/23/2014   3 units for iron deficiency anemia. as a baby - whenhe had pneumonia  . BPH (benign prostatic hyperplasia)   . CTS (carpal tunnel syndrome)    better  . Dyspnea   . GERD (gastroesophageal reflux disease)   . Hyperlipidemia   . Insomnia   . LBP (low back pain)   . Osteoarthritis   . Pneumonia    as a baby  . Sleep apnea    no cpap   Past Surgical History:  Procedure Laterality Date  . CARDIAC CATHETERIZATION N/A 08/15/2015   Procedure: Left Heart Cath and Coronary Angiography;  Surgeon: Lorretta Harp, MD;  Location: [facility];  Service: Cardiovascular;  Laterality: N/A;  . CARPAL TUNNEL RELEASE Bilateral 1996, 2004   Dr Lenoard Aden thumb surgery.  more than once  . COLONOSCOPY  2005, 2012   diverticulosis.   Marland Kitchen ESOPHAGOGASTRODUODENOSCOPY N/A 02/23/2014   Procedure: ESOPHAGOGASTRODUODENOSCOPY (EGD);  Surgeon: Irene Shipper, MD;  Location: [facility];  Service: Endoscopy;  Laterality: N/A;  . EYE SURGERY  2012   both cataracts, Lasik  . LUMBAR FUSION     x 3  . REVERSE SHOULDER ARTHROPLASTY Left 07/04/2016   Procedure: LEFT REVERSE SHOULDER ARTHROPLASTY;  Surgeon: Netta Cedars, MD;  Location: [place];  Service: Orthopedics;  Laterality: Left;  . TONSILLECTOMY  1946  . TOTAL KNEE ARTHROPLASTY  2003   Right    reports that he quit smoking about 30 years ago. he has never used smokeless tobacco. He reports that he drinks alcohol. He reports that he does not use drugs. family history includes Alzheimer's disease in his brother and father;  Aneurysm in his father; Autoimmune disease in his son; CAD in his brother; Cancer in his brother; Cancer (age of onset: 73) in his mother; Heart disease (age of onset: 66) in his mother; Heart disease (age of onset: 39) in his father; Stomach cancer in his brother. No Known Allergies    Outpatient Medications Prior to Visit  Medication Sig Dispense Refill  . aspirin 81 MG chewable tablet Chew 81 mg by mouth at bedtime.    . Cyanocobalamin (VITAMIN B-12) 1000 MCG SUBL Place 1 tablet (1,000 mcg total) under the tongue daily. 100 tablet 3  . DULoxetine (CYMBALTA) 60 MG capsule TAKE 1 CAPSULE BY MOUTH  DAILY 90 capsule 1  . folic acid (V-R FOLIC ACID) 518 MCG tablet Take 1 tablet (400 mcg total) by mouth daily. 90 tablet 3  . methocarbamol (ROBAXIN) 500 MG tablet Take 1 tablet (500 mg total) by mouth 3 (three) times daily as needed. 60 tablet 1  . pravastatin (PRAVACHOL) 20 MG tablet Take 1 tablet (20 mg total) by mouth daily. 90 tablet 3  . ranitidine (ZANTAC) 150 MG tablet Take 1 tablet (150 mg total) by mouth 2 (two) times daily. 180 tablet 3  . tamsulosin (FLOMAX) 0.4 MG CAPS capsule TAKE 2 CAPSULES BY MOUTH  DAILY 180 capsule 1  . traMADol (ULTRAM) 50  MG tablet Take 1-2 tablets (50-100 mg total) by mouth 2 (two) times daily as needed for severe pain. 120 tablet 3  . vitamin C (ASCORBIC ACID) 500 MG tablet Take 1 tablet (500 mg total) by mouth daily. 100 tablet 1  . VITAMIN D, ERGOCALCIFEROL, PO Take 1 tablet by mouth daily.    . finasteride (PROSCAR) 5 MG tablet TAKE 1 TABLET BY MOUTH  DAILY 90 tablet 1  . furosemide (LASIX) 20 MG tablet TAKE 1 TO 2 TABLETS BY  MOUTH DAILY AS NEEDED FOR  EDEMA 180 tablet 3  . nitroGLYCERIN (NITROSTAT) 0.4 MG SL tablet Place 1 tablet (0.4 mg total) under the tongue every 5 (five) minutes as needed for chest pain. (Patient not taking: Reported on 08/14/2017) 20 tablet 3   No facility-administered medications prior to visit.     ROS Review of Systems    Constitutional: Negative for appetite change, fatigue and unexpected weight change.  HENT: Negative for congestion, nosebleeds, sneezing, sore throat and trouble swallowing.   Eyes: Negative for itching and visual disturbance.  Respiratory: Negative for cough.   Cardiovascular: Negative for chest pain, palpitations and leg swelling.  Gastrointestinal: Negative for abdominal distention, blood in stool, diarrhea and nausea.  Genitourinary: Negative for frequency and hematuria.  Musculoskeletal: Positive for arthralgias, back pain and gait problem. Negative for joint swelling and neck pain.  Skin: Negative for rash.  Neurological: Negative for dizziness, tremors, speech difficulty and weakness.  Psychiatric/Behavioral: Negative for agitation, dysphoric mood and sleep disturbance. The patient is not nervous/anxious.     Objective:  BP 118/72 (BP Location: Left Arm, Patient Position: Sitting, Cuff Size: Large)   Pulse 60   Temp 97.7 F (36.5 C) (Oral)   Ht 6' (1.829 m)   Wt 244 lb (110.7 kg)   SpO2 99%   BMI 33.09 kg/m   BP Readings from Last 3 Encounters:  08/14/17 118/72  07/09/17 130/69  07/09/17 (!) 118/52    Wt Readings from Last 3 Encounters:  08/14/17 244 lb (110.7 kg)  07/09/17 245 lb 1.6 oz (111.2 kg)  04/15/17 236 lb (107 kg)    Physical Exam  Constitutional: He is oriented to person, place, and time. He appears well-developed. No distress.  NAD  HENT:  Mouth/Throat: Oropharynx is clear and moist.  Eyes: Conjunctivae are normal. Pupils are equal, round, and reactive to light.  Neck: Normal range of motion. No JVD present. No thyromegaly present.  Cardiovascular: Normal rate, regular rhythm, normal heart sounds and intact distal pulses. Exam reveals no gallop and no friction rub.  No murmur heard. Pulmonary/Chest: Effort normal and breath sounds normal. No respiratory distress. He has no wheezes. He has no rales. He exhibits no tenderness.  Abdominal: Soft. Bowel  sounds are normal. He exhibits no distension and no mass. There is no tenderness. There is no rebound and no guarding.  Musculoskeletal: Normal range of motion. He exhibits tenderness. He exhibits no edema.  Lymphadenopathy:    He has no cervical adenopathy.  Neurological: He is alert and oriented to person, place, and time. He has normal reflexes. No cranial nerve deficit. He exhibits normal muscle tone. He displays a negative Romberg sign. Coordination and gait normal.  Skin: Skin is warm and dry. No rash noted.  Psychiatric: He has a normal mood and affect. His behavior is normal. Judgment and thought content normal.  L knee is painful Not pale  Lab Results  Component Value Date   WBC 4.4 08/14/2017  HGB 10.3 (L) 08/14/2017   HCT 32.6 (L) 08/14/2017   PLT 229.0 08/14/2017   GLUCOSE 88 08/14/2017   CHOL 139 02/22/2016   TRIG 74.0 02/22/2016   HDL 51.70 02/22/2016   LDLDIRECT 157.5 07/05/2008   LDLCALC 73 02/22/2016   ALT 14 08/13/2015   AST 17 08/13/2015   NA 140 08/14/2017   K 4.6 08/14/2017   CL 103 08/14/2017   CREATININE 0.79 08/14/2017   BUN 15 08/14/2017   CO2 31 08/14/2017   TSH 0.831 08/13/2015   PSA 0.49 03/01/2012   INR 1.06 08/13/2015    [place] Low Ext Art Bil W/exercise  Result Date: 03/31/2017 CLINICAL DATA:  Bilateral lower extremity claudication EXAM: NONINVASIVE PHYSIOLOGIC VASCULAR STUDY OF BILATERAL LOWER EXTREMITIES WITH AND WITHOUT EXERCISE TECHNIQUE: Evaluation of both lower extremities were performed at rest, including calculation of ankle-brachial indices, multiple segmental pressure evaluation, segmental Doppler and segmental pulse volume recording. Ankle brachial indices were also obtained following treadmill exercise. COMPARISON:  None. FINDINGS: RESTING Right ABI: 1.2 Left ABI: 0.2 Right lower extremity: Triphasic Doppler waveforms throughout Left lower extremity: Triphasic Doppler waveforms throughout POST EXERCISE Right ABI: 1.1 Left ABI: 1.2  IMPRESSION: No significant arterial occlusive disease in the lower extremities. Electronically Signed   By: Marybelle Killings M.D.   On: 03/31/2017 09:36    Assessment & Plan:   Diagnoses and all orders for this visit:  Chronic pain of both knees  Iron deficiency anemia due to chronic blood loss  Dyslipidemia  B12 deficiency  Weakness  Essential hypertension   I am having Murlean Caller. Manso maintain his Vitamin E-31, aspirin, folic acid, nitroGLYCERIN, vitamin C, (VITAMIN D, ERGOCALCIFEROL, PO), methocarbamol, traMADol, ranitidine, DULoxetine, tamsulosin, and pravastatin.  No orders of the defined types were placed in this encounter.    Follow-up: Return in about 3 months (around 11/12/2017) for a follow-up visit.  [name], MD

## 2017-08-14 NOTE — Assessment & Plan Note (Signed)
No relapse CBC

## 2017-08-14 NOTE — Assessment & Plan Note (Signed)
Pravachol

## 2017-08-14 NOTE — Assessment & Plan Note (Signed)
On B12 

## 2017-08-20 ENCOUNTER — Other Ambulatory Visit: Payer: Self-pay | Admitting: Internal Medicine

## 2017-08-20 ENCOUNTER — Telehealth: Payer: Self-pay | Admitting: Hematology and Oncology

## 2017-08-20 NOTE — Assessment & Plan Note (Addendum)
The pt should be clear for his  L TKR in Jan 2019 assuming that his pre-op routine check up is satisfactory. He needs to be watched closely for anemia pre-op and post-op. Thank you!  Lab Results  Component Value Date   WBC 4.4 08/14/2017   HGB 10.3 (L) 08/14/2017   HCT 32.6 (L) 08/14/2017   MCV 83.1 08/14/2017   PLT 229.0 08/14/2017

## 2017-08-20 NOTE — Telephone Encounter (Signed)
Spoke to patients wife regarding upcoming January appointments.  °

## 2017-08-31 ENCOUNTER — Inpatient Hospital Stay: Payer: Medicare Other | Attending: Hematology and Oncology | Admitting: Hematology and Oncology

## 2017-08-31 ENCOUNTER — Encounter: Payer: Self-pay | Admitting: Hematology and Oncology

## 2017-08-31 ENCOUNTER — Ambulatory Visit (HOSPITAL_COMMUNITY)
Admission: RE | Admit: 2017-08-31 | Discharge: 2017-08-31 | Disposition: A | Discharge status: Home / Self Care | Payer: Medicare Other | Source: Ambulatory Visit | Attending: Hematology and Oncology | Admitting: Hematology and Oncology

## 2017-08-31 DIAGNOSIS — D509 Iron deficiency anemia, unspecified: Secondary | ICD-10-CM | POA: Insufficient documentation

## 2017-08-31 DIAGNOSIS — R195 Other fecal abnormalities: Secondary | ICD-10-CM | POA: Insufficient documentation

## 2017-08-31 DIAGNOSIS — D500 Iron deficiency anemia secondary to blood loss (chronic): Secondary | ICD-10-CM

## 2017-08-31 MED ORDER — SODIUM CHLORIDE 0.9 % IV SOLN
510.0000 mg | Freq: Once | INTRAVENOUS | Status: AC
  Administered 2017-08-31: 510 mg via INTRAVENOUS
  Filled 2017-08-31: qty 17

## 2017-08-31 NOTE — Assessment & Plan Note (Signed)
The patient had known history of recurrent iron deficiency anemia, thought to be related to GI bleed He denies NSAID but he remain on antiplatelet agents He tolerated oral iron supplement very poorly. The most likely cause of his anemia is due to chronic blood loss/malabsorption syndrome. We discussed some of the risks, benefits, and alternatives of intravenous iron infusions. The patient is symptomatic from anemia and the iron level is critically low. He tolerated oral iron supplement poorly and desires to achieved higher levels of iron faster for adequate hematopoesis. Some of the side-effects to be expected including risks of infusion reactions, phlebitis, headaches, nausea and fatigue.  The patient is willing to proceed. Patient education material was dispensed.  Goal is to keep ferritin level greater than 50 and resolution of anemia I plan to recheck his blood work again next month

## 2017-08-31 NOTE — Discharge Instructions (Signed)

## 2017-08-31 NOTE — Progress Notes (Signed)
[facility] OFFICE PROGRESS NOTE  [name], Lee Lacks, MD SUMMARY OF HEMATOLOGIC HISTORY:  His baseline blood work in July of 2013 were within normal limits. He was found to have abnormal CBC from blood drawn on 01/24/2014 which showed hemoglobin of 7 with MCV of 77.3. He was also found to have low serum vitamin B12 and he was given vitamin B12 injection. The patient subsequently had progressive fatigue and anemia. He was hospitalized and was given blood transfusions. Upper endoscopy evaluation was normal. A subsequent colonoscopy was also normal. He received iron infusion and is currently taking oral iron supplement. The patient denies over the counter NSAID ingestion. He is on antiplatelets agents. He was given intramuscular vitamin B-12 injection and iron infusion in October 2015. The patient have recurrence of iron deficiency anemia and started to receive IV iron again in 2018  INTERVAL HISTORY: [name] 77 y.o. male returns for further follow-up. He denies recent NSAID. He has intermittent dark stool. He took aspirin for cardiac prevention. He tolerated intravenous iron well without any allergic reaction or side effects.  I have reviewed the past medical history, past surgical history, social history and family history with the patient and they are unchanged from previous note.  ALLERGIES:  has No Known Allergies.  MEDICATIONS:  Current Outpatient Medications  Medication Sig Dispense Refill  . aspirin 81 MG chewable tablet Chew 81 mg by mouth at bedtime.    . Cyanocobalamin (VITAMIN B-12) 1000 MCG SUBL Place 1 tablet (1,000 mcg total) under the tongue daily. 100 tablet 3  . DULoxetine (CYMBALTA) 60 MG capsule TAKE 1 CAPSULE BY MOUTH  DAILY 90 capsule 1  . finasteride (PROSCAR) 5 MG tablet TAKE 1 TABLET BY MOUTH  DAILY 90 tablet 3  . folic acid (V-R FOLIC ACID) 782 MCG tablet Take 1 tablet (400 mcg total) by mouth daily. 90 tablet 3  . furosemide (LASIX) 20  MG tablet TAKE 1 TO 2 TABLETS BY  MOUTH DAILY AS NEEDED FOR  EDEMA 180 tablet 3  . methocarbamol (ROBAXIN) 500 MG tablet Take 1 tablet (500 mg total) by mouth 3 (three) times daily as needed. 60 tablet 1  . nitroGLYCERIN (NITROSTAT) 0.4 MG SL tablet Place 1 tablet (0.4 mg total) under the tongue every 5 (five) minutes as needed for chest pain. (Patient not taking: Reported on 08/14/2017) 20 tablet 3  . pravastatin (PRAVACHOL) 20 MG tablet Take 1 tablet (20 mg total) by mouth daily. 90 tablet 3  . ranitidine (ZANTAC) 150 MG tablet Take 1 tablet (150 mg total) by mouth 2 (two) times daily. 180 tablet 3  . tamsulosin (FLOMAX) 0.4 MG CAPS capsule TAKE 2 CAPSULES BY MOUTH  DAILY 180 capsule 1  . traMADol (ULTRAM) 50 MG tablet Take 1-2 tablets (50-100 mg total) by mouth 2 (two) times daily as needed for severe pain. 120 tablet 3  . vitamin C (ASCORBIC ACID) 500 MG tablet Take 1 tablet (500 mg total) by mouth daily. 100 tablet 1  . VITAMIN D, ERGOCALCIFEROL, PO Take 1 tablet by mouth daily.     No current facility-administered medications for this visit.      REVIEW OF SYSTEMS:   Constitutional: Denies fevers, chills or night sweats Eyes: Denies blurriness of vision Ears, nose, mouth, throat, and face: Denies mucositis or sore throat Respiratory: Denies cough, dyspnea or wheezes Cardiovascular: Denies palpitation, chest discomfort or lower extremity swelling Gastrointestinal:  Denies nausea, heartburn or change in bowel habits Skin: Denies abnormal  skin rashes Lymphatics: Denies new lymphadenopathy or easy bruising Neurological:Denies numbness, tingling or new weaknesses Behavioral/Psych: Mood is stable, no new changes  All other systems were reviewed with the patient and are negative.  PHYSICAL EXAMINATION: ECOG PERFORMANCE STATUS: 1 - Symptomatic but completely ambulatory  Vitals:   08/31/17 1348  BP: (!) 125/50  Pulse: 63  Resp: 20  Temp: 97.7 F (36.5 C)   Filed Weights   08/31/17  1348  Weight: 249 lb (112.9 kg)    GENERAL:alert, no distress and comfortable SKIN: skin color, texture, turgor are normal, no rashes or significant lesions EYES: normal, Conjunctiva are pink and non-injected, sclera clear OROPHARYNX:no exudate, no erythema and lips, buccal mucosa, and tongue normal  NECK: supple, thyroid normal size, non-tender, without nodularity LYMPH:  no palpable lymphadenopathy in the cervical, axillary or inguinal LUNGS: clear to auscultation and percussion with normal breathing effort HEART: regular rate & rhythm and no murmurs and no lower extremity edema ABDOMEN:abdomen soft, non-tender and normal bowel sounds Musculoskeletal:no cyanosis of digits and no clubbing  NEURO: alert & oriented x 3 with fluent speech, no focal motor/sensory deficits  LABORATORY DATA:  I have reviewed the data as listed     Component Value Date/Time   NA 140 08/14/2017 1046   NA 140 05/16/2014 1327   K 4.6 08/14/2017 1046   K 3.8 05/16/2014 1327   CL 103 08/14/2017 1046   CO2 31 08/14/2017 1046   CO2 28 05/16/2014 1327   GLUCOSE 88 08/14/2017 1046   GLUCOSE 98 05/16/2014 1327   BUN 15 08/14/2017 1046   BUN 17.6 05/16/2014 1327   CREATININE 0.79 08/14/2017 1046   CREATININE 0.82 08/13/2015 0953   CREATININE 0.9 05/16/2014 1327   CALCIUM 9.4 08/14/2017 1046   CALCIUM 9.4 05/16/2014 1327   PROT 6.4 08/13/2015 0953   PROT 6.9 05/16/2014 1327   ALBUMIN 4.2 08/13/2015 0953   ALBUMIN 3.7 05/16/2014 1327   AST 17 08/13/2015 0953   AST 19 05/16/2014 1327   ALT 14 08/13/2015 0953   ALT 9 05/16/2014 1327   ALKPHOS 55 08/13/2015 0953   ALKPHOS 66 05/16/2014 1327   BILITOT 0.6 08/13/2015 0953   BILITOT 0.50 05/16/2014 1327   GFRNONAA >60 07/05/2016 0448   GFRNONAA 87 08/13/2015 0953   GFRAA >60 07/05/2016 0448   GFRAA >89 08/13/2015 0953    No results found for: SPEP, UPEP  Lab Results  Component Value Date   WBC 4.4 08/14/2017   NEUTROABS 2.4 08/14/2017   HGB 10.3  (L) 08/14/2017   HCT 32.6 (L) 08/14/2017   MCV 83.1 08/14/2017   PLT 229.0 08/14/2017      Chemistry      Component Value Date/Time   NA 140 08/14/2017 1046   NA 140 05/16/2014 1327   K 4.6 08/14/2017 1046   K 3.8 05/16/2014 1327   CL 103 08/14/2017 1046   CO2 31 08/14/2017 1046   CO2 28 05/16/2014 1327   BUN 15 08/14/2017 1046   BUN 17.6 05/16/2014 1327   CREATININE 0.79 08/14/2017 1046   CREATININE 0.82 08/13/2015 0953   CREATININE 0.9 05/16/2014 1327      Component Value Date/Time   CALCIUM 9.4 08/14/2017 1046   CALCIUM 9.4 05/16/2014 1327   ALKPHOS 55 08/13/2015 0953   ALKPHOS 66 05/16/2014 1327   AST 17 08/13/2015 0953   AST 19 05/16/2014 1327   ALT 14 08/13/2015 0953   ALT 9 05/16/2014 1327   BILITOT 0.6 08/13/2015  0981   BILITOT 0.50 05/16/2014 1327      ASSESSMENT & PLAN:  Anemia, iron deficiency The patient had known history of recurrent iron deficiency anemia, thought to be related to GI bleed He denies NSAID but he remain on antiplatelet agents He tolerated oral iron supplement very poorly. The most likely cause of his anemia is due to chronic blood loss/malabsorption syndrome. We discussed some of the risks, benefits, and alternatives of intravenous iron infusions. The patient is symptomatic from anemia and the iron level is critically low. He tolerated oral iron supplement poorly and desires to achieved higher levels of iron faster for adequate hematopoesis. Some of the side-effects to be expected including risks of infusion reactions, phlebitis, headaches, nausea and fatigue.  The patient is willing to proceed. Patient education material was dispensed.  Goal is to keep ferritin level greater than 50 and resolution of anemia I plan to recheck his blood work again next month   No orders of the defined types were placed in this encounter.   All questions were answered. The patient knows to call the clinic with any problems, questions or concerns. No  barriers to learning was detected.  I spent 10 minutes counseling the patient face to face. The total time spent in the appointment was 15 minutes and more than 50% was on counseling.     Heath Lark, MD 1/7/20194:14 PM

## 2017-08-31 NOTE — Progress Notes (Signed)
Pt received Feraheme infusion at [facility] today without complication. Pt discharged in stable, ambulatory condition accompanied by wife.  Coolidge Breeze, RN 08/31/2017

## 2017-09-01 DIAGNOSIS — H903 Sensorineural hearing loss, bilateral: Secondary | ICD-10-CM | POA: Diagnosis not present

## 2017-09-02 DIAGNOSIS — H524 Presbyopia: Secondary | ICD-10-CM | POA: Diagnosis not present

## 2017-09-02 DIAGNOSIS — H04123 Dry eye syndrome of bilateral lacrimal glands: Secondary | ICD-10-CM | POA: Diagnosis not present

## 2017-09-02 DIAGNOSIS — H26492 Other secondary cataract, left eye: Secondary | ICD-10-CM | POA: Diagnosis not present

## 2017-09-06 DIAGNOSIS — G4733 Obstructive sleep apnea (adult) (pediatric): Secondary | ICD-10-CM | POA: Diagnosis not present

## 2017-09-07 ENCOUNTER — Encounter: Payer: Self-pay | Admitting: Neurology

## 2017-09-07 ENCOUNTER — Ambulatory Visit: Payer: Medicare Other | Admitting: Neurology

## 2017-09-07 ENCOUNTER — Inpatient Hospital Stay: Payer: Medicare Other

## 2017-09-07 VITALS — BP 126/66 | HR 59 | Temp 98.5°F | Resp 18

## 2017-09-07 VITALS — BP 120/70 | HR 72 | Ht 72.0 in | Wt 247.0 lb

## 2017-09-07 DIAGNOSIS — G4733 Obstructive sleep apnea (adult) (pediatric): Secondary | ICD-10-CM

## 2017-09-07 DIAGNOSIS — I10 Essential (primary) hypertension: Secondary | ICD-10-CM

## 2017-09-07 DIAGNOSIS — Z9989 Dependence on other enabling machines and devices: Secondary | ICD-10-CM | POA: Diagnosis not present

## 2017-09-07 DIAGNOSIS — D500 Iron deficiency anemia secondary to blood loss (chronic): Secondary | ICD-10-CM

## 2017-09-07 DIAGNOSIS — D509 Iron deficiency anemia, unspecified: Secondary | ICD-10-CM | POA: Diagnosis not present

## 2017-09-07 DIAGNOSIS — I2510 Atherosclerotic heart disease of native coronary artery without angina pectoris: Secondary | ICD-10-CM | POA: Diagnosis not present

## 2017-09-07 DIAGNOSIS — R195 Other fecal abnormalities: Secondary | ICD-10-CM | POA: Diagnosis not present

## 2017-09-07 MED ORDER — SODIUM CHLORIDE 0.9 % IV SOLN
510.0000 mg | Freq: Once | INTRAVENOUS | Status: AC
  Administered 2017-09-07: 510 mg via INTRAVENOUS
  Filled 2017-09-07: qty 17

## 2017-09-07 MED ORDER — SODIUM CHLORIDE 0.9 % IV SOLN
Freq: Once | INTRAVENOUS | Status: AC
  Administered 2017-09-07: 16:00:00 via INTRAVENOUS

## 2017-09-07 NOTE — Patient Instructions (Signed)

## 2017-09-07 NOTE — Progress Notes (Signed)
SLEEP MEDICINE CLINIC   Provider:  Larey Seat, M D  Referring Provider: Cassandria Anger, MD Primary Care Physician:  Cassandria Anger, MD  Chief Complaint  Patient presents with  . Follow-up    pt here with wife, pt states the mask he uses slips and comes off, wears his nose out. DME: [employer]   Interval history from 07 September 2017.  I have the pleasure of meeting with [name] today who is here for a compliance visit on CPAP.  The patient is highly compliant with CPAP use, 93% equals 28 out of 30 days, however 7 days for just below the 4-hour mark. Average use of time is 5 hours and 22 minutes each night, he is using an AutoSet between 7 and 15 cm water with 3 cm expiratory pressure relief.  The 95th percentile pressure is 13.2 the patient still has 4.9 residual obstructive apneas and very infrequent central apneas.  His overall AHI was 6.2.  I would like to increase the maximum pressure to 16 cm water.  He wakes with a very dry mouth using a nasal mask in large size, a [employer]  Wisp in XL. His mouth will drop open.  He has biotin mouth wash, and a chin strap. Still,  much improved over the last visit results. He is much less sleepy, more active and can watch TV without dozing of now.    I'm seeing [name] today on 03/05/2017 in a revisit. He has undergone a split-night titration study on 11/17/2016 and was re- diagnosed with obstructive sleep apnea. He had mostly shallow breathing spells, all obstructive in nature, with an index of 33.3 per hour of sleep, during REM sleep his AHI was 56.3 and in supine sleeps 32.8/hr. He also describes arousals out of dream sleep sometimes with a hint of confusion. The CPAP titration did make an impact but the patient only slept 12 minutes at the 12 cm water pressure that the front most beneficial. He produced a lot of apneas in spite of being on CPAP at pressures just below. For this reason I had placed him on an auto  titrating between 7 and 15 cm water with 3 cm expiratory pressure relief. The patient returns today with high pressure leaks and a high number of residual apneas at 13.9 per hour.  We have to me today to find a way to reduce his apnea to under 5.0 / hr. This seems to be a mask fit issues. The patient has a lot of air leaks and he describes discomfort from either tightening the mask too much or from having sounds produced by the air leaking. He remains fatigued with it fatigue severity score 53 and his Epworth score is elevated at 13 points. He is not depressed according to his geriatric depression scale at 2 points.   HPI: [name] is a 77 y.o. male , seen here as a referral  from Dr. Alain Marion for a sleep consultation. [name] is a 77 year old, right handed, caucasian married male patient, with a history of OSA. His original sleep study was performed at [facility], on [facility] in 2000  and revealed " severe" Obstructive Sleep Apnea. He has not been able To use CPAP, but it was not that he could not tolerate the CPAP- the durable medical equipment company furnished machine went out of business was in a month or so after his machine was issued  and he could never get  supplies again. Their office was on [address] he recalls. 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 He is surprised to learn that there are nasal pillows, nasal masks etc. and how small and quiet today CPAP machines. I also explained that many CPAP machines can be used as BiPAP as well.  [name] past medical history includes spot related injuries making it necessary to undergo a total knee replacement in the year 2002, he had 3 lumbar fusions, a tonsillectomy in childhood, he has been diagnosed in the past with iron deficiency anemia, has been treated for chronic pain with narcotic medications. 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 Chief complaint according to patient : " I snore, I stop breathing, my wife is concerned about my erratic breathing"  Sleep habits are as follows:  He usually goes to bed to watch TV news -around 11.00 PM . And it will take him usually 15-30 minutes but not longer to go to sleep, and usually falls asleep on his side. He does resume a supine position later during the night. He does snore and his wife reports that breathing is worse when supine position. He sleeps on only one pillow but elevates the head of bed with a wedge. His bedroom is described as cool, quiet and dark. Nocturia 3 or 4 at night. He wakes with hand and leg pain, cramping.  He rises at 8 AM, and feels " OK ", but craves more sleep. He wakes without headaches, but feels stiff.    Sleep medical history and family sleep history:  Tonsillectomy in childhood. Social history: ex smoker , since 2, ETOH ,  Wine once a month , caffeine : coffee 4-6  cups, iced tea 1 glas at dinner, Soda - rarely. No shift work history, worked as a Dealer.   Review of Systems: Out of a complete 14 system review, the patient complains of only the following symptoms, and all other reviewed systems are negative. Epworth score 11 from 12 , Fatigue severity score 35 from 46 , depression score 2/15 .    Social History   Socioeconomic History  . Marital status: Married    Spouse name: Not on file  . Number of children: Not on file  . Years of education: Not on file  . Highest education level: Not on file  Social Needs  . Financial resource strain: Not on file  . Food insecurity - worry: Not on file  . Food insecurity - inability: Not on file  . Transportation needs - medical: Not on file  . Transportation needs - non-medical: Not on file  Occupational History  . Occupation: Retired    Fish farm manager: RETIRED    CommentDatabase administrator  Tobacco Use  . Smoking status: Former  Smoker    Last attempt to quit: 01/08/1987    Years since quitting: 30.6  . Smokeless tobacco: Never Used  Substance and Sexual Activity  . Alcohol use: Yes    Alcohol/week: 0.0 oz    Comment: rare  . Drug use: No  . Sexual activity: Not Currently  Other Topics Concern  . Not on file  Social History Narrative   He lives with wife in a 2 story home.  Has 2 children.   Retired Dealer.   Highest level of education:  High school  Regular Exercise -  YES    Family History  Problem Relation Age of Onset  . Aneurysm Father        AAA  . Heart disease Father 51       CHF  . Alzheimer's disease Father   . Cancer Brother   . Alzheimer's disease Brother        dementia  . Stomach cancer Brother   . Cancer Mother 75       breast cancer  . Heart disease Mother 38       MI  . Autoimmune disease Son   . CAD Brother   . Colon cancer Neg Hx   . Esophageal cancer Neg Hx   . Pancreatic cancer Neg Hx   . Prostate cancer Neg Hx   . Rectal cancer Neg Hx     Past Medical History:  Diagnosis Date  . Anemia   . Blood transfusion without reported diagnosis 02/23/2014   3 units for iron deficiency anemia. as a baby - whenhe had pneumonia  . BPH (benign prostatic hyperplasia)   . CTS (carpal tunnel syndrome)    better  . Dyspnea   . GERD (gastroesophageal reflux disease)   . Hyperlipidemia   . Insomnia   . LBP (low back pain)   . Osteoarthritis   . Pneumonia    as a baby  . Sleep apnea    no cpap    Past Surgical History:  Procedure Laterality Date  . CARDIAC CATHETERIZATION N/A 08/15/2015   Procedure: Left Heart Cath and Coronary Angiography;  Surgeon: Lorretta Harp, MD;  Location: [place] [facility];  Service: Cardiovascular;  Laterality: N/A;  . CARPAL TUNNEL RELEASE Bilateral 1996, 2004   Dr Lenoard Aden thumb surgery.  more than once  . COLONOSCOPY  2005, 2012   diverticulosis.   Marland Kitchen ESOPHAGOGASTRODUODENOSCOPY N/A 02/23/2014   Procedure: ESOPHAGOGASTRODUODENOSCOPY  (EGD);  Surgeon: Irene Shipper, MD;  [facility];  Service: Endoscopy;  Laterality: N/A;  . EYE SURGERY  2012   both cataracts, Lasik  . LUMBAR FUSION     x 3  . REVERSE SHOULDER ARTHROPLASTY Left 07/04/2016   Procedure: LEFT REVERSE SHOULDER ARTHROPLASTY;  Surgeon: Netta Cedars, MD;  Location: [place];  Service: Orthopedics;  Laterality: Left;  . TONSILLECTOMY  1946  . TOTAL KNEE ARTHROPLASTY  2003   Right    Current Outpatient Medications  Medication Sig Dispense Refill  . aspirin 81 MG chewable tablet Chew 81 mg by mouth at bedtime.    . Cyanocobalamin (VITAMIN B-12) 1000 MCG SUBL Place 1 tablet (1,000 mcg total) under the tongue daily. 100 tablet 3  . DULoxetine (CYMBALTA) 60 MG capsule TAKE 1 CAPSULE BY MOUTH  DAILY 90 capsule 1  . finasteride (PROSCAR) 5 MG tablet TAKE 1 TABLET BY MOUTH  DAILY 90 tablet 3  . folic acid (V-R FOLIC ACID) 681 MCG tablet Take 1 tablet (400 mcg total) by mouth daily. 90 tablet 3  . furosemide (LASIX) 20 MG tablet TAKE 1 TO 2 TABLETS BY  MOUTH DAILY AS NEEDED FOR  EDEMA 180 tablet 3  . methocarbamol (ROBAXIN) 500 MG tablet Take 1 tablet (500 mg total) by mouth 3 (three) times daily as needed. 60 tablet 1  . nitroGLYCERIN (NITROSTAT) 0.4 MG SL tablet Place 1 tablet (0.4 mg total) under the tongue every 5 (five) minutes as needed for chest pain. 20 tablet 3  . pravastatin (PRAVACHOL) 20 MG tablet Take 1 tablet (20 mg  total) by mouth daily. 90 tablet 3  . ranitidine (ZANTAC) 150 MG tablet Take 1 tablet (150 mg total) by mouth 2 (two) times daily. 180 tablet 3  . tamsulosin (FLOMAX) 0.4 MG CAPS capsule TAKE 2 CAPSULES BY MOUTH  DAILY 180 capsule 1  . traMADol (ULTRAM) 50 MG tablet Take 1-2 tablets (50-100 mg total) by mouth 2 (two) times daily as needed for severe pain. 120 tablet 3  . vitamin C (ASCORBIC ACID) 500 MG tablet Take 1 tablet (500 mg total) by mouth daily. 100 tablet 1  . VITAMIN D, ERGOCALCIFEROL, PO Take 1 tablet by mouth daily.     No  current facility-administered medications for this visit.     Allergies as of 09/07/2017  . (No Known Allergies)    Vitals: BP 120/70   Pulse 72   Ht 6' (1.829 m)   Wt 247 lb (112 kg)   BMI 33.50 kg/m    Last Weight:  Wt Readings from Last 1 Encounters:  09/07/17 247 lb (112 kg)   PHX:TAVW mass index is 33.5 kg/m.     Last Height:   Ht Readings from Last 1 Encounters:  09/07/17 6' (1.829 m)    Physical exam:  General: The patient is awake, alert and is well groomed. Head: Normocephalic, atraumatic. Neck is supple. Mallampati 3-4  ,  neck circumference:19. Nasal airflow patent , TMJ click evident . Retrognathia is not seen.  Cardiovascular:  Regular rate and rhythm , without  murmurs or carotid bruit, and without distended neck veins. Respiratory: Lungs are clear to auscultation. Skin:  He has a bronzed skin color-  he is pale and appears icteric.  Trunk: BMI is 33  Neurologic exam : The patient is awake and alert. Speech is fluent,  without dysarthria, but with dysphonia. Mood and affect are appropriate.  Cranial nerves: Pupils are equal , EOM without nystagmus. Visual fields by finger perimetry are intact. Hearing to finger rub intact.  Facial sensation intact to fine touch. Facial motor strength is symmetric and tongue and uvula move midline. Shoulder shrug was symmetrical.   The patient was advised of the nature of the OSA= sleep disorder , the treatment options and risks for general a health and wellness arising from not treating the condition. He is happier with a nasal wisp mask, and his AHI is lower at 6.2 , he is less daytime sleepy.  I like to increase the pressure by one cm - will write for CPAP auto-titration to 7-16 cm water.  In addition the untreated obstructive sleep apnea may have let to left ventricular hyper trophy, hypertension or irregular heartbeats. He does not have a history of atrial fibrillation. I spent more than 20 minutes of face to face time  with the patient. Greater than 50% of time was spent in counseling and coordination of care. We have discussed the diagnosis and differential and I answered the patient's questions.     Assessment:   We need no new mask fitting.he will stay on the wisp XL,  I adjust his pressure.  The patient has some REM sleep behavior disorder which probably contributes to losing the air seal. Melissa at [employer] as met with the patient. Current mask is a dream wear mask. He will leave today after being refitted with an WISP nasal in XL.  Rv in 6 month with me or NP.    Asencion Partridge Lee Welp MD  09/07/2017   CC: [name], [name], Md [place],  [place] [phone]

## 2017-09-09 NOTE — Progress Notes (Signed)
Please place orders in Epic as patient is being scheduled for a pre-op appointment! Thank you! 

## 2017-09-11 ENCOUNTER — Ambulatory Visit: Payer: Self-pay | Admitting: Orthopedic Surgery

## 2017-09-14 DIAGNOSIS — M2242 Chondromalacia patellae, left knee: Secondary | ICD-10-CM | POA: Diagnosis not present

## 2017-09-14 NOTE — H&P (Signed)
TOTAL KNEE ADMISSION H&P  Patient is being admitted for left total knee arthroplasty.  Subjective:  Chief Complaint:left knee pain.  HPI: [name], 77 y.o. male, has a history of pain and functional disability in the left knee due to arthritis and has failed non-surgical conservative treatments for greater than 12 weeks to includecorticosteriod injections, flexibility and strengthening excercises, use of assistive devices and weight reduction as appropriate.  Onset of symptoms was gradual, starting 8 years ago with gradually worsening course since that time. The patient noted no past surgery on the left knee(s).  Patient currently rates pain in the left knee(s) at 7 out of 10 with activity. Patient has worsening of pain with activity and weight bearing, pain that interferes with activities of daily living and pain with passive range of motion.  Patient has evidence of subchondral sclerosis, periarticular osteophytes and joint space narrowing by imaging studies. There is no active infection.  Patient Active Problem List   Diagnosis Date Noted  . Knee pain, chronic 08/14/2017  . OSA on CPAP 03/05/2017  . OSA (obstructive sleep apnea) 09/30/2016  . S/P shoulder replacement, left 07/04/2016  . Preop exam for internal medicine 04/16/2016  . Claudication ([place]) 09/14/2015  . Pain in the chest   . Abnormal stress test 08/14/2015  . Dyspnea 07/23/2015  . Paresthesia 10/25/2014  . Weakness 04/13/2014  . Heme positive stool 02/22/2014  . Benign prostatic hyperplasia 02/22/2014  . B12 deficiency 02/21/2014  . Anemia, iron deficiency 02/21/2014  . Leg weakness, bilateral 01/24/2014  . Erectile dysfunction 01/24/2014  . Edema 01/24/2014  . URI, acute 08/31/2013  . Cellulitis of arm, right 07/13/2013  . Grief 07/13/2013  . Well adult exam 03/01/2012  . CARPAL TUNNEL SYNDROME 11/04/2010  . BENIGN PROSTATIC HYPERTROPHY, WITH URINARY OBSTRUCTION 11/04/2010  . WRIST PAIN 11/04/2010  . LEG  PAIN 07/16/2010  . COUGH 05/20/2010  . TOBACCO USE, QUIT 07/04/2009  . WARTS, VIRAL, UNSPECIFIED 11/06/2008  . INSOMNIA, PERSISTENT 01/14/2008  . Essential hypertension 01/14/2008  . RHINITIS 01/14/2008  . ABDOMINAL PAIN 01/14/2008  . TREMOR 09/07/2007  . Actinic keratosis 08/12/2007  . Neoplasm of uncertain behavior of skin 07/06/2007  . LOW BACK PAIN 07/06/2007  . Dyslipidemia 06/05/2007  . Coronary atherosclerosis 06/05/2007  . Osteoarthritis 06/05/2007   Past Medical History:  Diagnosis Date  . Anemia   . Blood transfusion without reported diagnosis 02/23/2014   3 units for iron deficiency anemia. as a baby - whenhe had pneumonia  . BPH (benign prostatic hyperplasia)   . CTS (carpal tunnel syndrome)    better  . Dyspnea   . GERD (gastroesophageal reflux disease)   . Hyperlipidemia   . Insomnia   . LBP (low back pain)   . Osteoarthritis   . Pneumonia    as a baby  . Sleep apnea    no cpap    Past Surgical History:  Procedure Laterality Date  . CARDIAC CATHETERIZATION N/A 08/15/2015   Procedure: Left Heart Cath and Coronary Angiography;  Surgeon: Lorretta Harp, MD;  Location: [place] [facility];  Service: Cardiovascular;  Laterality: N/A;  . CARPAL TUNNEL RELEASE Bilateral 1996, 2004   Dr Lenoard Aden thumb surgery.  more than once  . COLONOSCOPY  2005, 2012   diverticulosis.   Marland Kitchen ESOPHAGOGASTRODUODENOSCOPY N/A 02/23/2014   Procedure: ESOPHAGOGASTRODUODENOSCOPY (EGD);  Surgeon: Irene Shipper, MD;  Location[facility];  Service: Endoscopy;  Laterality: N/A;  . EYE SURGERY  2012   both cataracts, Lasik  .  LUMBAR FUSION     x 3  . REVERSE SHOULDER ARTHROPLASTY Left 07/04/2016   Procedure: LEFT REVERSE SHOULDER ARTHROPLASTY;  Surgeon: Netta Cedars, MD;  Location: [employer];  Service: Orthopedics;  Laterality: Left;  . TONSILLECTOMY  1946  . TOTAL KNEE ARTHROPLASTY  2003   Right    No current facility-administered medications for this encounter.    Current Outpatient  Medications  Medication Sig Dispense Refill Last Dose  . aspirin 81 MG chewable tablet Chew 81 mg by mouth at bedtime.   Taking  . Cholecalciferol (VITAMIN D3) 2000 units TABS Take 2,000 Units by mouth at bedtime.     . Coenzyme Q10 (CO Q-10 PO) Take 1 tablet by mouth at bedtime.     . Cyanocobalamin (VITAMIN B-12) 1000 MCG SUBL Place 1 tablet (1,000 mcg total) under the tongue daily. (Patient taking differently: Place 1,000 mcg under the tongue at bedtime. ) 100 tablet 3 Taking  . DULoxetine (CYMBALTA) 60 MG capsule TAKE 1 CAPSULE BY MOUTH  DAILY (Patient taking differently: TAKE 1 CAPSULE (60 MG) BY MOUTH  DAILY AT NIGHT.) 90 capsule 1 Taking  . finasteride (PROSCAR) 5 MG tablet TAKE 1 TABLET BY MOUTH  DAILY (Patient taking differently: TAKE 1 TABLET (5 MG) BY MOUTH  DAILY AT NIGHT) 90 tablet 3 Taking  . folic acid (V-R FOLIC ACID) 409 MCG tablet Take 1 tablet (400 mcg total) by mouth daily. (Patient taking differently: Take 400 mcg by mouth at bedtime. ) 90 tablet 3 Taking  . furosemide (LASIX) 20 MG tablet TAKE 1 TO 2 TABLETS BY  MOUTH DAILY AS NEEDED FOR  EDEMA (Patient taking differently: TAKE 1 TABLET (20 MG) BY  MOUTH DAILY IN THE EVENING) 180 tablet 3 Taking  . pravastatin (PRAVACHOL) 20 MG tablet Take 1 tablet (20 mg total) by mouth daily. (Patient taking differently: Take 20 mg by mouth at bedtime. ) 90 tablet 3 Taking  . ranitidine (ZANTAC) 150 MG tablet Take 1 tablet (150 mg total) by mouth 2 (two) times daily. (Patient taking differently: Take 150 mg by mouth at bedtime. ) 180 tablet 3 Taking  . tamsulosin (FLOMAX) 0.4 MG CAPS capsule TAKE 2 CAPSULES BY MOUTH  DAILY (Patient taking differently: TAKE 2 CAPSULES (0.8 MG) BY MOUTH  DAILY AT NIGHT) 180 capsule 1 Taking  . traMADol (ULTRAM) 50 MG tablet Take 1-2 tablets (50-100 mg total) by mouth 2 (two) times daily as needed for severe pain. (Patient taking differently: Take 50-100 mg by mouth 2 (two) times daily as needed for severe pain (for  knee pain.). ) 120 tablet 3 Taking  . vitamin C (ASCORBIC ACID) 500 MG tablet Take 1 tablet (500 mg total) by mouth daily. (Patient taking differently: Take 500 mg by mouth at bedtime. ) 100 tablet 1 Taking  . Polyethyl Glycol-Propyl Glycol (LUBRICANT EYE DROPS) 0.4-0.3 % SOLN Place 1-2 drops into both eyes daily.      No Known Allergies  Social History   Tobacco Use  . Smoking status: Former Smoker    Last attempt to quit: 01/08/1987    Years since quitting: 30.7  . Smokeless tobacco: Never Used  Substance Use Topics  . Alcohol use: Yes    Alcohol/week: 0.0 oz    Comment: rare    Family History  Problem Relation Age of Onset  . Aneurysm Father        AAA  . Heart disease Father 43       CHF  . Alzheimer's  disease Father   . Cancer Brother   . Alzheimer's disease Brother        dementia  . Stomach cancer Brother   . Cancer Mother 26       breast cancer  . Heart disease Mother 16       MI  . Autoimmune disease Son   . CAD Brother   . Colon cancer Neg Hx   . Esophageal cancer Neg Hx   . Pancreatic cancer Neg Hx   . Prostate cancer Neg Hx   . Rectal cancer Neg Hx      Review of Systems  Constitutional: Negative.   HENT: Negative.   Eyes: Negative.   Respiratory: Negative.   Cardiovascular: Negative.   Gastrointestinal: Negative.   Genitourinary: Negative.   Musculoskeletal: Positive for joint pain.  Skin: Negative.   Neurological: Negative.   Endo/Heme/Allergies: Negative.   Psychiatric/Behavioral: Negative.     Objective:  Physical Exam  Constitutional: He is oriented to person, place, and time. He appears well-developed.  HENT:  Head: Normocephalic.  Eyes: EOM are normal.  Neck: Normal range of motion.  Cardiovascular: Normal heart sounds and intact distal pulses.  GI: Soft.  Genitourinary:  Genitourinary Comments: Deferred  Musculoskeletal:  Left knee effusion. LLE grossly n/v intact. Knee is stable.  Neurological: He is alert and oriented to  person, place, and time.  Skin: Skin is warm and dry.  Psychiatric: His behavior is normal.    Vital signs in last 24 hours:    Labs:   Estimated body mass index is 33.5 kg/m as calculated from the following:   Height as of 09/07/17: 6' (1.829 m).   Weight as of 09/07/17: 112 kg (247 lb).   Imaging Review Plain radiographs demonstrate severe degenerative joint disease of the left knee(s). The overall alignment ismild varus. The bone quality appears to be good for age and reported activity level.  Assessment/Plan:  End stage arthritis, left knee   The patient history, physical examination, clinical judgment of the provider and imaging studies are consistent with end stage degenerative joint disease of the left knee(s) and total knee arthroplasty is deemed medically necessary. The treatment options including medical management, injection therapy arthroscopy and arthroplasty were discussed at length. The risks and benefits of total knee arthroplasty were presented and reviewed. The risks due to aseptic loosening, infection, stiffness, patella tracking problems, thromboembolic complications and other imponderables were discussed. The patient acknowledged the explanation, agreed to proceed with the plan and consent was signed. Patient is being admitted for inpatient treatment for surgery, pain control, PT, OT, prophylactic antibiotics, VTE prophylaxis, progressive ambulation and ADL's and discharge planning. The patient is planning to be discharged home with home health services.  Will use IV tranexamic acid. Contraindications and adverse affects of Tranexamic acid discussed in detail. Patient denies any of these at this time and understands the risks and benefits.

## 2017-09-16 ENCOUNTER — Other Ambulatory Visit (HOSPITAL_COMMUNITY): Payer: Self-pay | Admitting: Emergency Medicine

## 2017-09-16 NOTE — Progress Notes (Signed)
Surgical clearance Dr Alain Marion on chart

## 2017-09-16 NOTE — Patient Instructions (Addendum)
[name]  09/16/2017   Your procedure is scheduled on: 09-25-17  Report to [facility] Main  Entrance    Report to admitting at 1:15PM   Call this number if you have problems the morning of surgery [phone]     Remember: Do not eat food After Midnight. YOU MAY HAVE CLEAR LIQUIDS FROM MIDNIGHT UNTIL 9:45AM DAY OF SURGERY. NOTHING BY MOUTH AFTER 9:45AM!   PLEASE BRING CPAP MASK AND TUBING TO [place]. DEVICE WILL BE PROVIDED!   Take these medicines the morning of surgery with A SIP OF WATER: NONE                               You may not have any metal on your body including hair pins and              piercings  Do not wear jewelry, make-up, lotions, powders or perfumes, deodorant              Men may shave face and neck.   Do not bring valuables to the hospital. [place].  Contacts, dentures or bridgework may not be worn into surgery.  Leave suitcase in the car. After surgery it may be brought to your room.                Please read over the following fact sheets you were given: _____________________________________________________________________    CLEAR LIQUID DIET   Foods Allowed                                                                     Foods Excluded  Coffee and tea, regular and decaf                             liquids that you cannot  Plain Jell-O in any flavor                                             see through such as: Fruit ices (not with fruit pulp)                                     milk, soups, orange juice  Iced Popsicles                                    All solid food Carbonated beverages, regular and diet                                    Cranberry, grape and apple juices Sports drinks like Gatorade Lightly seasoned clear broth or consume(fat  free) Sugar, honey syrup  Sample Menu Breakfast                                Lunch                                      Supper Cranberry juice                    Beef broth                            Chicken broth Jell-O                                     Grape juice                           Apple juice Coffee or tea                        Jell-O                                      Popsicle                                                Coffee or tea                        Coffee or tea  _____________________________________________________________________  [facility] - Preparing for Surgery Before surgery, you can play an important role.  Because skin is not sterile, your skin needs to be as free of germs as possible.  You can reduce the number of germs on your skin by washing with CHG (chlorahexidine gluconate) soap before surgery.  CHG is an antiseptic cleaner which kills germs and bonds with the skin to continue killing germs even after washing. Please DO NOT use if you have an allergy to CHG or antibacterial soaps.  If your skin becomes reddened/irritated stop using the CHG and inform your nurse when you arrive at Short Stay. Do not shave (including legs and underarms) for at least 48 hours prior to the first CHG shower.  You may shave your face/neck. Please follow these instructions carefully:  1.  Shower with CHG Soap the night before surgery and the  morning of Surgery.  2.  If you choose to wash your hair, wash your hair first as usual with your  normal  shampoo.  3.  After you shampoo, rinse your hair and body thoroughly to remove the  shampoo.                           4.  Use CHG as you would any other liquid soap.  You can apply chg directly  to the skin and wash  Gently with a scrungie or clean washcloth.  5.  Apply the CHG Soap to your body ONLY FROM THE NECK DOWN.   Do not use on face/ open                           Wound or open sores. Avoid contact with eyes, ears mouth and genitals (private parts).                       Wash face,  Genitals (private parts)  with your normal soap.             6.  Wash thoroughly, paying special attention to the area where your surgery  will be performed.  7.  Thoroughly rinse your body with warm water from the neck down.  8.  DO NOT shower/wash with your normal soap after using and rinsing off  the CHG Soap.                9.  Pat yourself dry with a clean towel.            10.  Wear clean pajamas.            11.  Place clean sheets on your bed the night of your first shower and do not  sleep with pets. Day of Surgery : Do not apply any lotions/deodorants the morning of surgery.  Please wear clean clothes to the hospital/surgery center.  FAILURE TO FOLLOW THESE INSTRUCTIONS MAY RESULT IN THE CANCELLATION OF YOUR SURGERY PATIENT SIGNATURE_________________________________  NURSE SIGNATURE__________________________________  ________________________________________________________________________   Adam Phenix  An incentive spirometer is a tool that can help keep your lungs clear and active. This tool measures how well you are filling your lungs with each breath. Taking long deep breaths may help reverse or decrease the chance of developing breathing (pulmonary) problems (especially infection) following:  A long period of time when you are unable to move or be active. BEFORE THE PROCEDURE   If the spirometer includes an indicator to show your best effort, your nurse or respiratory therapist will set it to a desired goal.  If possible, sit up straight or lean slightly forward. Try not to slouch.  Hold the incentive spirometer in an upright position. INSTRUCTIONS FOR USE  1. Sit on the edge of your bed if possible, or sit up as far as you can in bed or on a chair. 2. Hold the incentive spirometer in an upright position. 3. Breathe out normally. 4. Place the mouthpiece in your mouth and seal your lips tightly around it. 5. Breathe in slowly and as deeply as possible, raising the piston or the ball  toward the top of the column. 6. Hold your breath for 3-5 seconds or for as long as possible. Allow the piston or ball to fall to the bottom of the column. 7. Remove the mouthpiece from your mouth and breathe out normally. 8. Rest for a few seconds and repeat Steps 1 through 7 at least 10 times every 1-2 hours when you are awake. Take your time and take a few normal breaths between deep breaths. 9. The spirometer may include an indicator to show your best effort. Use the indicator as a goal to work toward during each repetition. 10. After each set of 10 deep breaths, practice coughing to be sure your lungs are clear. If you have an incision (the cut made at the time of  surgery), support your incision when coughing by placing a pillow or rolled up towels firmly against it. Once you are able to get out of bed, walk around indoors and cough well. You may stop using the incentive spirometer when instructed by your caregiver.  RISKS AND COMPLICATIONS  Take your time so you do not get dizzy or light-headed.  If you are in pain, you may need to take or ask for pain medication before doing incentive spirometry. It is harder to take a deep breath if you are having pain. AFTER USE  Rest and breathe slowly and easily.  It can be helpful to keep track of a log of your progress. Your caregiver can provide you with a simple table to help with this. If you are using the spirometer at home, follow these instructions: [place] IF:   You are having difficultly using the spirometer.  You have trouble using the spirometer as often as instructed.  Your pain medication is not giving enough relief while using the spirometer.  You develop fever of 100.5 F (38.1 C) or higher. SEEK IMMEDIATE MEDICAL CARE IF:   You cough up bloody sputum that had not been present before.  You develop fever of 102 F (38.9 C) or greater.  You develop worsening pain at or near the incision site. MAKE SURE YOU:    Understand these instructions.  Will watch your condition.  Will get help right away if you are not doing well or get worse. Document Released: 12/22/2006 Document Revised: 11/03/2011 Document Reviewed: 02/22/2007 [facility] Patient Information 2014 [place], [place].   ________________________________________________________________________

## 2017-09-18 ENCOUNTER — Encounter (HOSPITAL_COMMUNITY): Payer: Self-pay

## 2017-09-18 ENCOUNTER — Other Ambulatory Visit: Payer: Self-pay

## 2017-09-18 ENCOUNTER — Encounter (HOSPITAL_COMMUNITY)
Admission: RE | Admit: 2017-09-18 | Discharge: 2017-09-18 | Disposition: A | Discharge status: Home / Self Care | Payer: Medicare Other | Source: Ambulatory Visit | Attending: Specialist | Admitting: Specialist

## 2017-09-18 DIAGNOSIS — M1712 Unilateral primary osteoarthritis, left knee: Secondary | ICD-10-CM | POA: Diagnosis not present

## 2017-09-18 DIAGNOSIS — Z01810 Encounter for preprocedural cardiovascular examination: Secondary | ICD-10-CM | POA: Insufficient documentation

## 2017-09-18 DIAGNOSIS — Z01812 Encounter for preprocedural laboratory examination: Secondary | ICD-10-CM | POA: Diagnosis not present

## 2017-09-18 DIAGNOSIS — I10 Essential (primary) hypertension: Secondary | ICD-10-CM | POA: Diagnosis not present

## 2017-09-18 HISTORY — DX: Essential (primary) hypertension: I10

## 2017-09-18 LAB — BASIC METABOLIC PANEL
Anion gap: 5 (ref 5–15)
BUN: 16 mg/dL (ref 6–20)
CO2: 30 mmol/L (ref 22–32)
Calcium: 9.5 mg/dL (ref 8.9–10.3)
Chloride: 105 mmol/L (ref 101–111)
Creatinine, Ser: 0.71 mg/dL (ref 0.61–1.24)
GFR calc Af Amer: >60 mL/min (ref >60)
GFR calc non Af Amer: >60 mL/min (ref >60)
Glucose, Bld: 104 mg/dL — ABNORMAL HIGH (ref 65–99)
Potassium: 4.7 mmol/L (ref 3.5–5.1)
Sodium: 140 mmol/L (ref 135–145)

## 2017-09-18 LAB — URINALYSIS, ROUTINE W REFLEX MICROSCOPIC
Bilirubin Urine: NEGATIVE
Glucose, UA: NEGATIVE mg/dL
Hgb urine dipstick: NEGATIVE
Ketones, ur: NEGATIVE mg/dL
Leukocytes, UA: NEGATIVE
Nitrite: NEGATIVE
Protein, ur: NEGATIVE mg/dL
Specific Gravity, Urine: 1.016 (ref 1.005–1.030)
pH: 5 (ref 5.0–8.0)

## 2017-09-18 LAB — ABO/RH: ABO/RH(D): O POS

## 2017-09-18 LAB — CBC
HCT: 36.9 % — ABNORMAL LOW (ref 39.0–52.0)
Hemoglobin: 11.2 g/dL — ABNORMAL LOW (ref 13.0–17.0)
MCH: 26.9 pg (ref 26.0–34.0)
MCHC: 30.4 g/dL (ref 30.0–36.0)
MCV: 88.5 fL (ref 78.0–100.0)
Platelets: 219 10*3/uL (ref 150–400)
RBC: 4.17 MIL/uL — ABNORMAL LOW (ref 4.22–5.81)
RDW: 22 % — ABNORMAL HIGH (ref 11.5–15.5)
WBC: 5.2 10*3/uL (ref 4.0–10.5)

## 2017-09-18 LAB — APTT: aPTT: 40 seconds — ABNORMAL HIGH (ref 24–36)

## 2017-09-18 LAB — PROTIME-INR
INR: 0.99
Prothrombin Time: 13 seconds (ref 11.4–15.2)

## 2017-09-18 LAB — SURGICAL PCR SCREEN
MRSA, PCR: NEGATIVE
Staphylococcus aureus: NEGATIVE

## 2017-09-23 ENCOUNTER — Encounter: Payer: Self-pay | Admitting: Family

## 2017-09-23 ENCOUNTER — Telehealth: Payer: Self-pay | Admitting: Family

## 2017-09-23 ENCOUNTER — Ambulatory Visit (INDEPENDENT_AMBULATORY_CARE_PROVIDER_SITE_OTHER): Payer: Medicare Other | Admitting: Family

## 2017-09-23 VITALS — BP 124/62 | HR 64 | Temp 98.0°F | Wt 251.0 lb

## 2017-09-23 DIAGNOSIS — J069 Acute upper respiratory infection, unspecified: Secondary | ICD-10-CM

## 2017-09-23 NOTE — Patient Instructions (Addendum)
Would recommend OTC Loratidine ( Claritin) 10 mg daily; okay to continue Mucinex daily; it is okay to use Flonase;

## 2017-09-23 NOTE — Telephone Encounter (Signed)
Please call his orthopedist ( Dr. Theda Sers) at [phone]; he was seen today and do not feel antibiotic is needed; recommended OTC Claritin and Flonase for nasal congestion;

## 2017-09-23 NOTE — Progress Notes (Signed)
[name] is a 77 y.o. male with the following history as recorded in EpicCare:  Patient Active Problem List   Diagnosis Date Noted  . Knee pain, chronic 08/14/2017  . OSA on CPAP 03/05/2017  . OSA (obstructive sleep apnea) 09/30/2016  . S/P shoulder replacement, left 07/04/2016  . Preop exam for internal medicine 04/16/2016  . Claudication ([place]) 09/14/2015  . Pain in the chest   . Abnormal stress test 08/14/2015  . Dyspnea 07/23/2015  . Paresthesia 10/25/2014  . Weakness 04/13/2014  . Heme positive stool 02/22/2014  . Benign prostatic hyperplasia 02/22/2014  . B12 deficiency 02/21/2014  . Anemia, iron deficiency 02/21/2014  . Leg weakness, bilateral 01/24/2014  . Erectile dysfunction 01/24/2014  . Edema 01/24/2014  . URI, acute 08/31/2013  . Cellulitis of arm, right 07/13/2013  . Grief 07/13/2013  . Well adult exam 03/01/2012  . CARPAL TUNNEL SYNDROME 11/04/2010  . BENIGN PROSTATIC HYPERTROPHY, WITH URINARY OBSTRUCTION 11/04/2010  . WRIST PAIN 11/04/2010  . LEG PAIN 07/16/2010  . COUGH 05/20/2010  . TOBACCO USE, QUIT 07/04/2009  . WARTS, VIRAL, UNSPECIFIED 11/06/2008  . INSOMNIA, PERSISTENT 01/14/2008  . Essential hypertension 01/14/2008  . RHINITIS 01/14/2008  . ABDOMINAL PAIN 01/14/2008  . TREMOR 09/07/2007  . Actinic keratosis 08/12/2007  . Neoplasm of uncertain behavior of skin 07/06/2007  . LOW BACK PAIN 07/06/2007  . Dyslipidemia 06/05/2007  . Coronary atherosclerosis 06/05/2007  . Osteoarthritis 06/05/2007    Current Outpatient Medications  Medication Sig Dispense Refill  . aspirin 81 MG chewable tablet Chew 81 mg by mouth at bedtime.    . Cholecalciferol (VITAMIN D3) 2000 units TABS Take 2,000 Units by mouth at bedtime.    . Coenzyme Q10 (CO Q-10 PO) Take 1 tablet by mouth at bedtime.    . Cyanocobalamin (VITAMIN B-12) 1000 MCG SUBL Place 1 tablet (1,000 mcg total) under the tongue daily. (Patient taking differently: Place 1,000 mcg under the  tongue at bedtime. ) 100 tablet 3  . DULoxetine (CYMBALTA) 60 MG capsule TAKE 1 CAPSULE BY MOUTH  DAILY (Patient taking differently: TAKE 1 CAPSULE (60 MG) BY MOUTH  DAILY AT NIGHT.) 90 capsule 1  . finasteride (PROSCAR) 5 MG tablet TAKE 1 TABLET BY MOUTH  DAILY (Patient taking differently: TAKE 1 TABLET (5 MG) BY MOUTH  DAILY AT NIGHT) 90 tablet 3  . folic acid (V-R FOLIC ACID) 734 MCG tablet Take 1 tablet (400 mcg total) by mouth daily. (Patient taking differently: Take 400 mcg by mouth at bedtime. ) 90 tablet 3  . furosemide (LASIX) 20 MG tablet TAKE 1 TO 2 TABLETS BY  MOUTH DAILY AS NEEDED FOR  EDEMA (Patient taking differently: TAKE 1 TABLET (20 MG) BY  MOUTH DAILY IN THE EVENING) 180 tablet 3  . Polyethyl Glycol-Propyl Glycol (LUBRICANT EYE DROPS) 0.4-0.3 % SOLN Place 1-2 drops into both eyes daily.    . pravastatin (PRAVACHOL) 20 MG tablet Take 1 tablet (20 mg total) by mouth daily. (Patient taking differently: Take 20 mg by mouth at bedtime. ) 90 tablet 3  . ranitidine (ZANTAC) 150 MG tablet Take 1 tablet (150 mg total) by mouth 2 (two) times daily. (Patient taking differently: Take 150 mg by mouth at bedtime. ) 180 tablet 3  . tamsulosin (FLOMAX) 0.4 MG CAPS capsule TAKE 2 CAPSULES BY MOUTH  DAILY (Patient taking differently: TAKE 2 CAPSULES (0.8 MG) BY MOUTH  DAILY AT NIGHT) 180 capsule 1  . traMADol (ULTRAM) 50 MG tablet Take 1-2 tablets (  50-100 mg total) by mouth 2 (two) times daily as needed for severe pain. (Patient taking differently: Take 50-100 mg by mouth 2 (two) times daily as needed for severe pain (for knee pain.). ) 120 tablet 3  . vitamin C (ASCORBIC ACID) 500 MG tablet Take 1 tablet (500 mg total) by mouth daily. (Patient taking differently: Take 500 mg by mouth at bedtime. ) 100 tablet 1   No current facility-administered medications for this visit.     Allergies: Patient has no known allergies.  Past Medical History:  Diagnosis Date  . Anemia   . Blood transfusion without  reported diagnosis 02/23/2014   3 units for iron deficiency anemia. as a baby - whenhe had pneumonia  . BPH (benign prostatic hyperplasia)   . CTS (carpal tunnel syndrome)    better  . Dyspnea   . GERD (gastroesophageal reflux disease)   . Hyperlipidemia   . Hypertension    "THEY DIAGNOSED ME YEARS AGO BUT LATELY IVE ACTUALLY HAD LOW BLOOD PRESSURES "   . Insomnia   . LBP (low back pain)   . Osteoarthritis   . Pneumonia    as a baby  . Sleep apnea    no cpap    Past Surgical History:  Procedure Laterality Date  . CARDIAC CATHETERIZATION N/A 08/15/2015   Procedure: Left Heart Cath and Coronary Angiography;  Surgeon: Lorretta Harp, MD;  Location: [facility];  Service: Cardiovascular;  Laterality: N/A;  . CARPAL TUNNEL RELEASE Bilateral 1996, 2004   Dr Lenoard Aden thumb surgery.  more than once  . COLONOSCOPY  2005, 2012   diverticulosis.   Marland Kitchen ESOPHAGOGASTRODUODENOSCOPY N/A 02/23/2014   Procedure: ESOPHAGOGASTRODUODENOSCOPY (EGD);  Surgeon: Irene Shipper, MD;  [facility];  Service: Endoscopy;  Laterality: N/A;  . EYE SURGERY  2012   both cataracts, Lasik  . LEFT KNEE ARTHROSCOPY Left 07/16/2017    [facility]   . LUMBAR FUSION     x 3  . REVERSE SHOULDER ARTHROPLASTY Left 07/04/2016   Procedure: LEFT REVERSE SHOULDER ARTHROPLASTY;  Surgeon: Netta Cedars, MD;  Location: [facility];  Service: Orthopedics;  Laterality: Left;  . TONSILLECTOMY  1946  . TOTAL KNEE ARTHROPLASTY  2003   Right    Family History  Problem Relation Age of Onset  . Aneurysm Father        AAA  . Heart disease Father 45       CHF  . Alzheimer's disease Father   . Cancer Brother   . Alzheimer's disease Brother        dementia  . Stomach cancer Brother   . Cancer Mother 54       breast cancer  . Heart disease Mother 34       MI  . Autoimmune disease Son   . CAD Brother   . Colon cancer Neg Hx   . Esophageal cancer Neg Hx   . Pancreatic cancer Neg Hx   . Prostate cancer Neg Hx   .  Rectal cancer Neg Hx     Social History   Tobacco Use  . Smoking status: Former Smoker    Last attempt to quit: 01/08/1987    Years since quitting: 30.7  . Smokeless tobacco: Never Used  Substance Use Topics  . Alcohol use: Yes    Alcohol/week: 0.0 oz    Comment: rare    Subjective:  Patient presents with concerns for 1 day history of nasal congestion; seemed to start after  exposure to allergens at a friend's house; worried about upcoming knee replacement on Friday and wants to make sure no antibiotic is needed; has started taking OTC Mucinex; cannot have any type of blood-thinner or aspirin containing product; no fever; no sore throat; no headache; "just a runny nose." Patient does have Flonase at home and has used in the past with no complications.     Objective:  Vitals:   09/23/17 1611  BP: 124/62  Pulse: 64  Temp: 98 F (36.7 C)  SpO2: 98%  Weight: 251 lb (113.9 kg)    General: Well developed, well nourished, in no acute distress  Skin : Warm and dry.  Head: Normocephalic and atraumatic  Eyes: Sclera and conjunctiva clear; pupils round and reactive to light; extraocular movements intact  Ears: External normal; canals clear; tympanic membranes congested bilaterally Oropharynx: Pink, supple. No suspicious lesions  Neck: Supple without thyromegaly, adenopathy  Lungs: Respirations unlabored; clear to auscultation bilaterally without wheeze, rales, rhonchi  CVS exam: normal rate and regular rhythm.  Neurologic: Alert and oriented; speech intact; face symmetrical; moves all extremities well; CNII-XII intact without focal deficit  Assessment:  1. Upper respiratory tract infection, unspecified type     Plan:  Suspect allergic component; recommend to use OTC Claritin 10 mg daily and Flonase; can continue Mucinex as needed; do not feel antibiotic needed; feel patient will be okay to have knee replacement surgery on Friday; follow-up worse, no better.   No Follow-up on file.   No orders of the defined types were placed in this encounter.   Requested Prescriptions    No prescriptions requested or ordered in this encounter

## 2017-09-24 MED ORDER — TRANEXAMIC ACID 1000 MG/10ML IV SOLN
1000.0000 mg | INTRAVENOUS | Status: AC
  Administered 2017-09-25: 1000 mg via INTRAVENOUS
  Filled 2017-09-24: qty 1100

## 2017-09-24 NOTE — Telephone Encounter (Signed)
I have talked with [name] at [facility] to advise of [name]'s note/instructions, patient also advised that I have contacted dr Theda Sers office and advised same instructions

## 2017-09-25 ENCOUNTER — Inpatient Hospital Stay (HOSPITAL_COMMUNITY): Payer: Medicare Other | Admitting: Anesthesiology

## 2017-09-25 ENCOUNTER — Encounter (HOSPITAL_COMMUNITY): Payer: Self-pay

## 2017-09-25 ENCOUNTER — Inpatient Hospital Stay (HOSPITAL_COMMUNITY)
Admission: RE | Admit: 2017-09-25 | Discharge: 2017-09-28 | DRG: 470 | Disposition: A | Discharge status: Home Health | Payer: Medicare Other | Source: Ambulatory Visit | Attending: Specialist | Admitting: Specialist

## 2017-09-25 ENCOUNTER — Encounter (HOSPITAL_COMMUNITY)
Admission: RE | Disposition: A | Discharge status: Home Health | Payer: Self-pay | Source: Ambulatory Visit | Attending: Specialist

## 2017-09-25 DIAGNOSIS — Z7982 Long term (current) use of aspirin: Secondary | ICD-10-CM | POA: Diagnosis not present

## 2017-09-25 DIAGNOSIS — M1712 Unilateral primary osteoarthritis, left knee: Principal | ICD-10-CM | POA: Diagnosis present

## 2017-09-25 DIAGNOSIS — E538 Deficiency of other specified B group vitamins: Secondary | ICD-10-CM | POA: Diagnosis not present

## 2017-09-25 DIAGNOSIS — F05 Delirium due to known physiological condition: Secondary | ICD-10-CM | POA: Diagnosis not present

## 2017-09-25 DIAGNOSIS — K219 Gastro-esophageal reflux disease without esophagitis: Secondary | ICD-10-CM | POA: Diagnosis present

## 2017-09-25 DIAGNOSIS — G4733 Obstructive sleep apnea (adult) (pediatric): Secondary | ICD-10-CM | POA: Diagnosis present

## 2017-09-25 DIAGNOSIS — M25562 Pain in left knee: Secondary | ICD-10-CM | POA: Diagnosis present

## 2017-09-25 DIAGNOSIS — Z6834 Body mass index (BMI) 34.0-34.9, adult: Secondary | ICD-10-CM

## 2017-09-25 DIAGNOSIS — Z96651 Presence of right artificial knee joint: Secondary | ICD-10-CM | POA: Diagnosis present

## 2017-09-25 DIAGNOSIS — Z96612 Presence of left artificial shoulder joint: Secondary | ICD-10-CM | POA: Diagnosis present

## 2017-09-25 DIAGNOSIS — T402X5A Adverse effect of other opioids, initial encounter: Secondary | ICD-10-CM | POA: Diagnosis not present

## 2017-09-25 DIAGNOSIS — I10 Essential (primary) hypertension: Secondary | ICD-10-CM | POA: Diagnosis present

## 2017-09-25 DIAGNOSIS — Z96659 Presence of unspecified artificial knee joint: Secondary | ICD-10-CM

## 2017-09-25 DIAGNOSIS — N400 Enlarged prostate without lower urinary tract symptoms: Secondary | ICD-10-CM | POA: Diagnosis present

## 2017-09-25 DIAGNOSIS — D649 Anemia, unspecified: Secondary | ICD-10-CM | POA: Diagnosis not present

## 2017-09-25 DIAGNOSIS — I2510 Atherosclerotic heart disease of native coronary artery without angina pectoris: Secondary | ICD-10-CM | POA: Diagnosis present

## 2017-09-25 DIAGNOSIS — R296 Repeated falls: Secondary | ICD-10-CM | POA: Diagnosis not present

## 2017-09-25 DIAGNOSIS — E785 Hyperlipidemia, unspecified: Secondary | ICD-10-CM | POA: Diagnosis not present

## 2017-09-25 DIAGNOSIS — Z87891 Personal history of nicotine dependence: Secondary | ICD-10-CM | POA: Diagnosis not present

## 2017-09-25 DIAGNOSIS — G8918 Other acute postprocedural pain: Secondary | ICD-10-CM | POA: Diagnosis not present

## 2017-09-25 HISTORY — PX: TOTAL KNEE ARTHROPLASTY: SHX125

## 2017-09-25 LAB — TYPE AND SCREEN
ABO/RH(D): O POS
Antibody Screen: NEGATIVE

## 2017-09-25 SURGERY — ARTHROPLASTY, KNEE, TOTAL
Anesthesia: General | Site: Knee | Laterality: Left

## 2017-09-25 MED ORDER — DEXAMETHASONE SODIUM PHOSPHATE 10 MG/ML IJ SOLN
10.0000 mg | Freq: Once | INTRAMUSCULAR | Status: AC
  Administered 2017-09-26: 10 mg via INTRAVENOUS
  Filled 2017-09-25: qty 1

## 2017-09-25 MED ORDER — ACETAMINOPHEN 650 MG RE SUPP: 650.0000 mg | RECTAL | Status: DC | PRN

## 2017-09-25 MED ORDER — ZOLPIDEM TARTRATE 5 MG PO TABS: 5.0000 mg | ORAL_TABLET | Freq: Every evening | ORAL | Status: DC | PRN

## 2017-09-25 MED ORDER — FINASTERIDE 5 MG PO TABS
5.0000 mg | ORAL_TABLET | Freq: Every day | ORAL | Status: DC
  Administered 2017-09-26 – 2017-09-28 (×3): 5 mg via ORAL
  Filled 2017-09-25 (×3): qty 1

## 2017-09-25 MED ORDER — DIPHENHYDRAMINE HCL 12.5 MG/5ML PO ELIX: 12.5000 mg | ORAL_SOLUTION | ORAL | Status: DC | PRN

## 2017-09-25 MED ORDER — PROPOFOL 10 MG/ML IV BOLUS
INTRAVENOUS | Status: AC
  Filled 2017-09-25: qty 20

## 2017-09-25 MED ORDER — SUGAMMADEX SODIUM 200 MG/2ML IV SOLN
INTRAVENOUS | Status: AC
  Filled 2017-09-25: qty 2

## 2017-09-25 MED ORDER — DEXAMETHASONE SODIUM PHOSPHATE 10 MG/ML IJ SOLN
10.0000 mg | Freq: Once | INTRAMUSCULAR | Status: AC
  Administered 2017-09-25: 10 mg via INTRAVENOUS

## 2017-09-25 MED ORDER — MIDAZOLAM HCL 2 MG/2ML IJ SOLN
INTRAMUSCULAR | Status: DC | PRN
  Administered 2017-09-25 (×2): 1 mg via INTRAVENOUS

## 2017-09-25 MED ORDER — SUCCINYLCHOLINE CHLORIDE 200 MG/10ML IV SOSY
PREFILLED_SYRINGE | INTRAVENOUS | Status: DC | PRN
Start: 2017-09-25 — End: 2017-09-25

## 2017-09-25 MED ORDER — ONDANSETRON HCL 4 MG/2ML IJ SOLN: 4.0000 mg | Freq: Four times a day (QID) | INTRAMUSCULAR | Status: DC | PRN

## 2017-09-25 MED ORDER — ASPIRIN EC 325 MG PO TBEC: 325.0000 mg | DELAYED_RELEASE_TABLET | Freq: Every day | ORAL | 0 refills | Status: DC

## 2017-09-25 MED ORDER — TAMSULOSIN HCL 0.4 MG PO CAPS
0.8000 mg | ORAL_CAPSULE | Freq: Every day | ORAL | Status: DC
  Administered 2017-09-26 – 2017-09-28 (×3): 0.8 mg via ORAL
  Filled 2017-09-25 (×3): qty 2

## 2017-09-25 MED ORDER — LIDOCAINE 2% (20 MG/ML) 5 ML SYRINGE
INTRAMUSCULAR | Status: AC
Start: 2017-09-25 — End: ?
  Filled 2017-09-25: qty 5

## 2017-09-25 MED ORDER — SODIUM CHLORIDE 0.9 % IJ SOLN
INTRAMUSCULAR | Status: DC | PRN
Start: 2017-09-25 — End: 2017-09-25
  Administered 2017-09-25: 30 mL

## 2017-09-25 MED ORDER — HYDROMORPHONE HCL 1 MG/ML IJ SOLN: 1.0000 mg | INTRAMUSCULAR | Status: DC | PRN

## 2017-09-25 MED ORDER — CEFAZOLIN SODIUM-DEXTROSE 2-4 GM/100ML-% IV SOLN
2.0000 g | INTRAVENOUS | Status: AC
  Administered 2017-09-25: 2 g via INTRAVENOUS
  Filled 2017-09-25: qty 100

## 2017-09-25 MED ORDER — METHOCARBAMOL 1000 MG/10ML IJ SOLN
500.0000 mg | Freq: Four times a day (QID) | INTRAVENOUS | Status: DC | PRN
  Administered 2017-09-25: 500 mg via INTRAVENOUS
  Filled 2017-09-25: qty 550

## 2017-09-25 MED ORDER — OXYCODONE HCL 5 MG/5ML PO SOLN: 5.0000 mg | Freq: Once | ORAL | Status: DC | PRN

## 2017-09-25 MED ORDER — BUPIVACAINE-EPINEPHRINE 0.25% -1:200000 IJ SOLN
INTRAMUSCULAR | Status: DC | PRN
  Administered 2017-09-25: 30 mL

## 2017-09-25 MED ORDER — OXYCODONE HCL 5 MG PO TABS: 5.0000 mg | ORAL_TABLET | Freq: Once | ORAL | Status: DC | PRN

## 2017-09-25 MED ORDER — KETOROLAC TROMETHAMINE 30 MG/ML IJ SOLN
INTRAMUSCULAR | Status: DC | PRN
  Administered 2017-09-25: 30 mg via INTRAMUSCULAR

## 2017-09-25 MED ORDER — MIDAZOLAM HCL 2 MG/2ML IJ SOLN
INTRAMUSCULAR | Status: AC
  Filled 2017-09-25: qty 2

## 2017-09-25 MED ORDER — OXYCODONE HCL 5 MG PO TABS
5.0000 mg | ORAL_TABLET | ORAL | Status: DC | PRN
  Administered 2017-09-26 (×3): 5 mg via ORAL
  Filled 2017-09-25: qty 1

## 2017-09-25 MED ORDER — PROPOFOL 10 MG/ML IV BOLUS
INTRAVENOUS | Status: DC | PRN
  Administered 2017-09-25: 10 mg via INTRAVENOUS
  Administered 2017-09-25: 100 mg via INTRAVENOUS

## 2017-09-25 MED ORDER — PHENOL 1.4 % MT LIQD
1.0000 | OROMUCOSAL | Status: DC | PRN
  Filled 2017-09-25: qty 177

## 2017-09-25 MED ORDER — MENTHOL 3 MG MT LOZG: 1.0000 | LOZENGE | OROMUCOSAL | Status: DC | PRN

## 2017-09-25 MED ORDER — ALUM & MAG HYDROXIDE-SIMETH 200-200-20 MG/5ML PO SUSP: 30.0000 mL | ORAL | Status: DC | PRN

## 2017-09-25 MED ORDER — ROPIVACAINE HCL 7.5 MG/ML IJ SOLN
INTRAMUSCULAR | Status: DC | PRN
  Administered 2017-09-25: 20 mL via PERINEURAL

## 2017-09-25 MED ORDER — OXYCODONE HCL ER 10 MG PO T12A
10.0000 mg | EXTENDED_RELEASE_TABLET | Freq: Two times a day (BID) | ORAL | Status: DC
  Administered 2017-09-26: 10 mg via ORAL
  Filled 2017-09-25: qty 1

## 2017-09-25 MED ORDER — METOCLOPRAMIDE HCL 5 MG/ML IJ SOLN: 5.0000 mg | Freq: Three times a day (TID) | INTRAMUSCULAR | Status: DC | PRN

## 2017-09-25 MED ORDER — CHLORHEXIDINE GLUCONATE 4 % EX LIQD: 60.0000 mL | Freq: Once | CUTANEOUS | Status: DC

## 2017-09-25 MED ORDER — SODIUM CHLORIDE 0.9 % IJ SOLN
INTRAMUSCULAR | Status: AC
  Filled 2017-09-25: qty 50

## 2017-09-25 MED ORDER — BISACODYL 5 MG PO TBEC: 5.0000 mg | DELAYED_RELEASE_TABLET | Freq: Every day | ORAL | Status: DC | PRN

## 2017-09-25 MED ORDER — FENTANYL CITRATE (PF) 100 MCG/2ML IJ SOLN: 25.0000 ug | INTRAMUSCULAR | Status: DC | PRN

## 2017-09-25 MED ORDER — OXYCODONE HCL 5 MG PO TABS
5.0000 mg | ORAL_TABLET | Freq: Once | ORAL | Status: DC | PRN
  Filled 2017-09-25: qty 1

## 2017-09-25 MED ORDER — KETOROLAC TROMETHAMINE 30 MG/ML IJ SOLN
INTRAMUSCULAR | Status: AC
  Filled 2017-09-25: qty 1

## 2017-09-25 MED ORDER — MAGNESIUM CITRATE PO SOLN: 1.0000 | Freq: Once | ORAL | Status: DC | PRN

## 2017-09-25 MED ORDER — ONDANSETRON HCL 4 MG/2ML IJ SOLN
INTRAMUSCULAR | Status: DC | PRN
  Administered 2017-09-25: 4 mg via INTRAVENOUS

## 2017-09-25 MED ORDER — PHENYLEPHRINE HCL 10 MG/ML IJ SOLN
INTRAMUSCULAR | Status: DC | PRN
  Administered 2017-09-25: 40 ug via INTRAVENOUS
  Administered 2017-09-25: 120 ug via INTRAVENOUS
  Administered 2017-09-25: 160 ug via INTRAVENOUS
  Administered 2017-09-25 (×2): 120 ug via INTRAVENOUS

## 2017-09-25 MED ORDER — METOCLOPRAMIDE HCL 5 MG PO TABS: 5.0000 mg | ORAL_TABLET | Freq: Three times a day (TID) | ORAL | Status: DC | PRN

## 2017-09-25 MED ORDER — 0.9 % SODIUM CHLORIDE (POUR BTL) OPTIME
TOPICAL | Status: DC | PRN
  Administered 2017-09-25: 1000 mL

## 2017-09-25 MED ORDER — BACLOFEN 10 MG PO TABS: 10.0000 mg | ORAL_TABLET | Freq: Every day | ORAL | 1 refills | Status: DC

## 2017-09-25 MED ORDER — LACTATED RINGERS IV SOLN
INTRAVENOUS | Status: DC
  Administered 2017-09-25: 14:00:00 via INTRAVENOUS

## 2017-09-25 MED ORDER — FUROSEMIDE 20 MG PO TABS
20.0000 mg | ORAL_TABLET | Freq: Every day | ORAL | Status: DC
  Administered 2017-09-26 – 2017-09-28 (×3): 20 mg via ORAL
  Filled 2017-09-25 (×4): qty 1

## 2017-09-25 MED ORDER — ROCURONIUM BROMIDE 10 MG/ML (PF) SYRINGE
PREFILLED_SYRINGE | INTRAVENOUS | Status: DC | PRN
  Administered 2017-09-25: 50 mg via INTRAVENOUS

## 2017-09-25 MED ORDER — POLYVINYL ALCOHOL 1.4 % OP SOLN
1.0000 [drp] | Freq: Every day | OPHTHALMIC | Status: DC
  Administered 2017-09-28: 2 [drp] via OPHTHALMIC
  Filled 2017-09-25: qty 15

## 2017-09-25 MED ORDER — PRAVASTATIN SODIUM 20 MG PO TABS
20.0000 mg | ORAL_TABLET | Freq: Every day | ORAL | Status: DC
  Administered 2017-09-26 – 2017-09-28 (×3): 20 mg via ORAL
  Filled 2017-09-25 (×3): qty 1

## 2017-09-25 MED ORDER — DEXAMETHASONE SODIUM PHOSPHATE 10 MG/ML IJ SOLN
INTRAMUSCULAR | Status: AC
  Filled 2017-09-25: qty 1

## 2017-09-25 MED ORDER — STERILE WATER FOR IRRIGATION IR SOLN
Status: DC | PRN
  Administered 2017-09-25: 2000 mL

## 2017-09-25 MED ORDER — BUPIVACAINE-EPINEPHRINE 0.25% -1:200000 IJ SOLN
INTRAMUSCULAR | Status: AC
  Filled 2017-09-25: qty 1

## 2017-09-25 MED ORDER — DULOXETINE HCL 60 MG PO CPEP
60.0000 mg | ORAL_CAPSULE | Freq: Every day | ORAL | Status: DC
  Administered 2017-09-26 – 2017-09-28 (×3): 60 mg via ORAL
  Filled 2017-09-25 (×3): qty 1

## 2017-09-25 MED ORDER — FENTANYL CITRATE (PF) 250 MCG/5ML IJ SOLN
INTRAMUSCULAR | Status: DC | PRN
  Administered 2017-09-25: 50 ug via INTRAVENOUS
  Administered 2017-09-25 (×2): 100 ug via INTRAVENOUS
  Administered 2017-09-25 (×2): 50 ug via INTRAVENOUS
  Administered 2017-09-25: 100 ug via INTRAVENOUS
  Administered 2017-09-25: 50 ug via INTRAVENOUS

## 2017-09-25 MED ORDER — LIDOCAINE 2% (20 MG/ML) 5 ML SYRINGE
INTRAMUSCULAR | Status: DC | PRN
  Administered 2017-09-25: 60 mg via INTRAVENOUS

## 2017-09-25 MED ORDER — MIDAZOLAM HCL 2 MG/2ML IJ SOLN
1.0000 mg | INTRAMUSCULAR | Status: DC
  Administered 2017-09-25: 1 mg via INTRAVENOUS
  Filled 2017-09-25: qty 2

## 2017-09-25 MED ORDER — LACTATED RINGERS IV SOLN
INTRAVENOUS | Status: DC | PRN
  Administered 2017-09-25 (×2): via INTRAVENOUS

## 2017-09-25 MED ORDER — CEFAZOLIN SODIUM-DEXTROSE 2-4 GM/100ML-% IV SOLN
2.0000 g | Freq: Four times a day (QID) | INTRAVENOUS | Status: AC
  Administered 2017-09-25 – 2017-09-26 (×2): 2 g via INTRAVENOUS
  Filled 2017-09-25 (×2): qty 100

## 2017-09-25 MED ORDER — OXYCODONE HCL 5 MG/5ML PO SOLN
5.0000 mg | Freq: Once | ORAL | Status: DC | PRN
  Filled 2017-09-25: qty 5

## 2017-09-25 MED ORDER — ONDANSETRON HCL 4 MG/2ML IJ SOLN
INTRAMUSCULAR | Status: AC
  Filled 2017-09-25: qty 2

## 2017-09-25 MED ORDER — SODIUM CHLORIDE 0.9 % IR SOLN
Status: DC | PRN
  Administered 2017-09-25: 1000 mL

## 2017-09-25 MED ORDER — OXYCODONE HCL 5 MG PO TABS
10.0000 mg | ORAL_TABLET | ORAL | Status: DC | PRN
  Administered 2017-09-27 (×2): 10 mg via ORAL
  Filled 2017-09-25 (×3): qty 2

## 2017-09-25 MED ORDER — SUGAMMADEX SODIUM 500 MG/5ML IV SOLN
INTRAVENOUS | Status: DC | PRN
  Administered 2017-09-25: 300 mg via INTRAVENOUS

## 2017-09-25 MED ORDER — FENTANYL CITRATE (PF) 100 MCG/2ML IJ SOLN
50.0000 ug | INTRAMUSCULAR | Status: DC
  Administered 2017-09-25: 50 ug via INTRAVENOUS
  Filled 2017-09-25: qty 2

## 2017-09-25 MED ORDER — FERROUS SULFATE 325 (65 FE) MG PO TABS
325.0000 mg | ORAL_TABLET | Freq: Three times a day (TID) | ORAL | Status: DC
  Administered 2017-09-26 – 2017-09-28 (×7): 325 mg via ORAL
  Filled 2017-09-25 (×7): qty 1

## 2017-09-25 MED ORDER — METHOCARBAMOL 500 MG PO TABS
500.0000 mg | ORAL_TABLET | Freq: Four times a day (QID) | ORAL | Status: DC | PRN
  Administered 2017-09-26 – 2017-09-28 (×5): 500 mg via ORAL
  Filled 2017-09-25 (×5): qty 1

## 2017-09-25 MED ORDER — FENTANYL CITRATE (PF) 250 MCG/5ML IJ SOLN
INTRAMUSCULAR | Status: AC
  Filled 2017-09-25: qty 5

## 2017-09-25 MED ORDER — ACETAMINOPHEN 325 MG PO TABS
650.0000 mg | ORAL_TABLET | ORAL | Status: DC | PRN
  Administered 2017-09-26 – 2017-09-28 (×4): 650 mg via ORAL
  Filled 2017-09-25 (×4): qty 2

## 2017-09-25 MED ORDER — POLYETHYLENE GLYCOL 3350 17 G PO PACK: 17.0000 g | PACK | Freq: Every day | ORAL | Status: DC | PRN

## 2017-09-25 MED ORDER — ROCURONIUM BROMIDE 10 MG/ML (PF) SYRINGE
PREFILLED_SYRINGE | INTRAVENOUS | Status: AC
  Filled 2017-09-25: qty 5

## 2017-09-25 MED ORDER — SODIUM CHLORIDE 0.9 % IV SOLN
INTRAVENOUS | Status: DC
  Administered 2017-09-25: via INTRAVENOUS

## 2017-09-25 MED ORDER — DOCUSATE SODIUM 100 MG PO CAPS
100.0000 mg | ORAL_CAPSULE | Freq: Two times a day (BID) | ORAL | Status: DC
  Administered 2017-09-26 – 2017-09-28 (×5): 100 mg via ORAL
  Filled 2017-09-25 (×5): qty 1

## 2017-09-25 MED ORDER — ONDANSETRON HCL 4 MG PO TABS: 4.0000 mg | ORAL_TABLET | Freq: Four times a day (QID) | ORAL | Status: DC | PRN

## 2017-09-25 MED ORDER — OXYCODONE HCL 5 MG PO TABS: 5.0000 mg | ORAL_TABLET | ORAL | 0 refills | Status: DC | PRN

## 2017-09-25 MED ORDER — ENOXAPARIN SODIUM 30 MG/0.3ML ~~LOC~~ SOLN
30.0000 mg | Freq: Two times a day (BID) | SUBCUTANEOUS | Status: DC
  Administered 2017-09-26 – 2017-09-28 (×5): 30 mg via SUBCUTANEOUS
  Filled 2017-09-25 (×5): qty 0.3

## 2017-09-25 SURGICAL SUPPLY — 58 items
ADH SKN CLS APL DERMABOND .7 (GAUZE/BANDAGES/DRESSINGS) ×1
BAG DECANTER FOR FLEXI CONT (MISCELLANEOUS) IMPLANT
BAG SPEC THK2 15X12 ZIP CLS (MISCELLANEOUS) ×2
BAG ZIPLOCK 12X15 (MISCELLANEOUS) ×4 IMPLANT
BANDAGE ACE 4X5 VEL STRL LF (GAUZE/BANDAGES/DRESSINGS) ×2 IMPLANT
BANDAGE ACE 6X5 VEL STRL LF (GAUZE/BANDAGES/DRESSINGS) ×2 IMPLANT
BLADE SAG 18X100X1.27 (BLADE) ×2 IMPLANT
BLADE SAW SGTL 13.0X1.19X90.0M (BLADE) ×2 IMPLANT
BOWL SMART MIX CTS (DISPOSABLE) ×2 IMPLANT
CAP KNEE TOTAL 3 SIGMA ×2 IMPLANT
COVER SURGICAL LIGHT HANDLE (MISCELLANEOUS) ×2 IMPLANT
CUFF TOURN SGL QUICK 34 (TOURNIQUET CUFF) ×1
CUFF TRNQT CYL 34X4X40X1 (TOURNIQUET CUFF) ×1 IMPLANT
DECANTER SPIKE VIAL GLASS SM (MISCELLANEOUS) ×2 IMPLANT
DERMABOND ADVANCED (GAUZE/BANDAGES/DRESSINGS) ×1
DERMABOND ADVANCED .7 DNX12 (GAUZE/BANDAGES/DRESSINGS) ×1 IMPLANT
DRAPE U-SHAPE 47X51 STRL (DRAPES) ×2 IMPLANT
DRSG AQUACEL AG ADV 3.5X10 (GAUZE/BANDAGES/DRESSINGS) ×2 IMPLANT
DRSG TEGADERM 4X4.75 (GAUZE/BANDAGES/DRESSINGS) ×2 IMPLANT
DURAPREP 26ML APPLICATOR (WOUND CARE) ×4 IMPLANT
ELECT REM PT RETURN 15FT ADLT (MISCELLANEOUS) ×2 IMPLANT
EVACUATOR 1/8 PVC DRAIN (DRAIN) ×2 IMPLANT
GAUZE SPONGE 2X2 8PLY STRL LF (GAUZE/BANDAGES/DRESSINGS) ×1 IMPLANT
GLOVE BIO SURGEON STRL SZ7.5 (GLOVE) ×4 IMPLANT
GLOVE BIOGEL PI IND STRL 8 (GLOVE) ×2 IMPLANT
GLOVE BIOGEL PI INDICATOR 8 (GLOVE) ×2
GLOVE ECLIPSE 8.0 STRL XLNG CF (GLOVE) ×4 IMPLANT
GLOVE SURG ORTHO 9.0 STRL STRW (GLOVE) ×2 IMPLANT
GLOVE SURG SS PI 7.5 STRL IVOR (GLOVE) ×2 IMPLANT
GOWN STRL REUS W/TWL XL LVL3 (GOWN DISPOSABLE) ×4 IMPLANT
HANDPIECE INTERPULSE COAX TIP (DISPOSABLE) ×2
IMMOBILIZER KNEE 20 (SOFTGOODS) ×4 IMPLANT
IMMOBILIZER KNEE 20 THIGH 36 (SOFTGOODS) ×1 IMPLANT
NS IRRIG 1000ML POUR BTL (IV SOLUTION) ×2 IMPLANT
PACK TOTAL KNEE CUSTOM (KITS) ×2 IMPLANT
POSITIONER SURGICAL ARM (MISCELLANEOUS) ×2 IMPLANT
SET HNDPC FAN SPRY TIP SCT (DISPOSABLE) ×1 IMPLANT
SET PAD KNEE POSITIONER (MISCELLANEOUS) ×2 IMPLANT
SPONGE GAUZE 2X2 STER 10/PKG (GAUZE/BANDAGES/DRESSINGS) ×1
SPONGE LAP 18X18 X RAY DECT (DISPOSABLE) IMPLANT
SPONGE SURGIFOAM ABS GEL 100 (HEMOSTASIS) ×2 IMPLANT
STOCKINETTE 6  STRL (DRAPES) ×1
STOCKINETTE 6 STRL (DRAPES) ×1 IMPLANT
SUCTION FRAZIER HANDLE 12FR (TUBING) ×1
SUCTION TUBE FRAZIER 12FR DISP (TUBING) ×1 IMPLANT
SUT BONE WAX W31G (SUTURE) IMPLANT
SUT MNCRL AB 3-0 PS2 18 (SUTURE) ×2 IMPLANT
SUT VIC AB 1 CT1 27 (SUTURE) ×8
SUT VIC AB 1 CT1 27XBRD ANTBC (SUTURE) ×4 IMPLANT
SUT VIC AB 2-0 CT1 27 (SUTURE) ×4
SUT VIC AB 2-0 CT1 TAPERPNT 27 (SUTURE) ×2 IMPLANT
SUT VLOC 180 0 24IN GS25 (SUTURE) ×2 IMPLANT
SYR 50ML LL SCALE MARK (SYRINGE) ×2 IMPLANT
TAPE STRIPS DRAPE STRL (GAUZE/BANDAGES/DRESSINGS) ×2 IMPLANT
TRAY FOLEY W/METER SILVER 16FR (SET/KITS/TRAYS/PACK) ×2 IMPLANT
WATER STERILE IRR 1000ML POUR (IV SOLUTION) ×4 IMPLANT
WRAP KNEE MAXI GEL POST OP (GAUZE/BANDAGES/DRESSINGS) ×2 IMPLANT
YANKAUER SUCT BULB TIP 10FT TU (MISCELLANEOUS) ×2 IMPLANT

## 2017-09-25 NOTE — Op Note (Signed)
DATE OF SURGERY:  09/25/2017 2 TIME: 7:20 PM  PATIENT NAME:  [name]    AGE: 77 y.o.   PRE-OPERATIVE DIAGNOSIS:  left knee osteoarthritis  POST-OPERATIVE DIAGNOSIS:  left knee osteoarthritis  PROCEDURE:  Procedure(s): LEFT TOTAL KNEE ARTHROPLASTY  SURGEON:  Jerrol Helmers ANDREW  ASSISTANT:  Bryson Stilwell, PA-C, present and scrubbed throughout the case, critical for assistance with exposure, retraction, instrumentation, and closure.  OPERATIVE IMPLANTS: Depuy PFC Sigma Rotating Platform.  Femur size 5, Tibia size 5, Patella size 41 3-peg oval button, with a 12.5 mm polyethylene insert.   PREOPERATIVE INDICATIONS:   [name] is a 77 y.o. year old male with end stage bone on bone arthritis of the knee who failed conservative treatment and elected for Total Knee Arthroplasty.   The risks, benefits, and alternatives were discussed at length including but not limited to the risks of infection, bleeding, nerve injury, stiffness, blood clots, the need for revision surgery, cardiopulmonary complications, among others, and they were willing to proceed.  OPERATIVE DESCRIPTION:  The patient was brought to the operative room and placed in a supine position.  Spinal anesthesia was administered.  IV antibiotics were given.  The lower extremity was prepped and draped in the usual sterile fashion.  Time out was performed.  The leg was elevated and exsanguinated and the tourniquet was inflated.  Anterior quadriceps tendon splitting approach was performed.  The patella was retracted and osteophytes were removed.  The anterior horn of the medial and lateral meniscus was removed and cruciate ligaments resected.   The distal femur was opened with the drill and the intramedullary distal femoral cutting jig was utilized, set at 5 degrees resecting 10 mm off the distal femur.  Care was taken to protect the collateral ligaments.  The distal femoral sizing jig was applied, taking care  to avoid notching.  Then the 4-in-1 cutting jig was applied and the anterior and posterior femur was cut, along with the chamfer cuts.    Then the extramedullary tibial cutting jig was utilized making the appropriate cut using the anterior tibial crest as a reference building in appropriate posterior slope.  Care was taken during the cut to protect the medial and collateral ligaments.  The proximal tibia was removed along with the posterior horns of the menisci.   The posterior medial femoral osteophytes and posterior lateral femoral osteophytes were removed.    The flexion gap was then measured and was symmetric with the extension gap, measured at 12.  I completed the distal femoral preparation using the appropriate jig to prepare the box.  The patella was then measured, and cut with the saw.    The proximal tibia sized and prepared accordingly with the reamer and the punch, and then all components were trialed with the trial insert.  The knee was found to have excellent balance and full motion.    The above named components were then cemented into place and all excess cement was removed.  The trial polyethylene component was in place during cementation, and then was exchanged for the real polyethylene component.    The knee was easily taken through a range of motion and the patella tracked well and the knee irrigated copiously and the parapatellar and subcutaneous tissue closed with vicryl, and monocryl with steri strips for the skin.  The arthrotomy was closed at 90 of flexion. The wounds were dressed with sterile gauze and the tourniquet released and the patient was awakened and returned to the PACU  in stable and satisfactory condition.  There were no complications.  Total tourniquet time was 90 minutes.

## 2017-09-25 NOTE — Interval H&P Note (Signed)
History and Physical Interval Note:  09/25/2017 2:04 PM  [name]  has presented today for surgery, with the diagnosis of left knee osteoarthritis  The various methods of treatment have been discussed with the patient and family. After consideration of risks, benefits and other options for treatment, the patient has consented to  Procedure(s): LEFT TOTAL KNEE ARTHROPLASTY (Left) as a surgical intervention .  The patient's history has been reviewed, patient examined, no change in status, stable for surgery.  I have reviewed the patient's chart and labs.  Questions were answered to the patient's satisfaction.     Roselind Klus ANDREW

## 2017-09-25 NOTE — Interval H&P Note (Signed)
History and Physical Interval Note:  09/25/2017 4:23 PM  [name]  has presented today for surgery, with the diagnosis of left knee osteoarthritis  The various methods of treatment have been discussed with the patient and family. After consideration of risks, benefits and other options for treatment, the patient has consented to  Procedure(s): LEFT TOTAL KNEE ARTHROPLASTY (Left) as a surgical intervention .  The patient's history has been reviewed, patient examined, no change in status, stable for surgery.  I have reviewed the patient's chart and labs.  Questions were answered to the patient's satisfaction.     Bookert Guzzi ANDREW

## 2017-09-25 NOTE — Anesthesia Procedure Notes (Addendum)
Procedure Name: Intubation Date/Time: 09/25/2017 5:21 PM Performed by: Cynda Familia, CRNA Pre-anesthesia Checklist: Patient identified, Emergency Drugs available, Suction available and Patient being monitored Patient Re-evaluated:Patient Re-evaluated prior to induction Oxygen Delivery Method: Circle System Utilized Preoxygenation: Pre-oxygenation with 100% oxygen Induction Type: IV induction Ventilation: Mask ventilation without difficulty Laryngoscope Size: Miller and 2 Tube type: Oral Number of attempts: 1 Airway Equipment and Method: Stylet Placement Confirmation: ETT inserted through vocal cords under direct vision,  positive ETCO2 and breath sounds checked- equal and bilateral Secured at: 21 cm Tube secured with: Tape Dental Injury: Teeth and Oropharynx as per pre-operative assessment  Comments: Smooth IV induction Moser-- intubation AM CRNA atraumatic-- teeth and mouth as preop-- chipped teeth and missing teeth--  bilat BS Moser

## 2017-09-25 NOTE — Anesthesia Postprocedure Evaluation (Signed)
Anesthesia Post Note  Patient: [name]  Procedure(s) Performed: LEFT TOTAL KNEE ARTHROPLASTY (Left Knee)     Patient location during evaluation: PACU Anesthesia Type: General Level of consciousness: sedated and patient cooperative Pain management: pain level controlled Vital Signs Assessment: post-procedure vital signs reviewed and stable Respiratory status: spontaneous breathing Cardiovascular status: stable Anesthetic complications: no    Last Vitals:  Vitals:   09/25/17 2200 09/25/17 2215  BP: (!) 159/78 140/78  Pulse: 85 84  Resp: 15 15  Temp:  36.6 C  SpO2: 93% 95%    Last Pain:  Vitals:   09/25/17 2155  TempSrc:   PainSc: [place]

## 2017-09-25 NOTE — Anesthesia Procedure Notes (Signed)
Date/Time: 09/25/2017 7:15 PM Performed by: Cynda Familia, CRNA Pre-anesthesia Checklist: Emergency Drugs available, Suction available, Patient being monitored and Patient identified Oxygen Delivery Method: Simple face mask Placement Confirmation: positive ETCO2 and breath sounds checked- equal and bilateral Dental Injury: Teeth and Oropharynx as per pre-operative assessment

## 2017-09-25 NOTE — Anesthesia Procedure Notes (Signed)
Anesthesia Regional Block: Adductor canal block   Pre-Anesthetic Checklist: ,, timeout performed, Correct Patient, Correct Site, Correct Laterality, Correct Procedure, Correct Position, site marked, Risks and benefits discussed,  Surgical consent,  Pre-op evaluation,  At surgeon's request and post-op pain management  Laterality: Lower and Left  Prep: chloraprep       Needles:  Injection technique: Single-shot  Needle Type: Echogenic Stimulator Needle          Additional Needles:   Procedures:,,,, ultrasound used (permanent image in chart),,,,  Narrative:  Start time: 09/25/2017 4:51 PM End time: 09/25/2017 4:54 PM Injection made incrementally with aspirations every 5 mL.  Performed by: Personally  Anesthesiologist: Oleta Mouse, MD  Additional Notes: H+P and labs reviewed, risks and benefits discussed with patient, procedure tolerated well without complications

## 2017-09-25 NOTE — Transfer of Care (Signed)
Immediate Anesthesia Transfer of Care Note  Patient: [name]  Procedure(s) Performed: LEFT TOTAL KNEE ARTHROPLASTY (Left Knee)  Patient Location: PACU  Anesthesia Type:General  Level of Consciousness: sedated  Airway & Oxygen Therapy: Patient Spontanous Breathing and Patient connected to face mask oxygen  Post-op Assessment: Report given to RN and Post -op Vital signs reviewed and stable  Post vital signs: Reviewed and stable  Last Vitals:  Vitals:   09/25/17 1705 09/25/17 1922  BP:  (P) 133/82  Pulse: 72 (P) 95  Resp:  (P) 18  Temp:    SpO2: 95%     Last Pain:  Vitals:   09/25/17 1922  TempSrc:   PainSc: (P) Asleep      Patients Stated Pain Goal: 2 (31/43/88 8757)  Complications: No apparent anesthesia complications

## 2017-09-25 NOTE — Progress Notes (Signed)
Assisted Dr. Ermalene Postin with adductor canal block. Side rails up, monitors on throughout procedure. See vital signs in flow sheet. Tolerated Procedure well.

## 2017-09-25 NOTE — Anesthesia Preprocedure Evaluation (Addendum)
Anesthesia Evaluation  Patient identified by MRN, date of birth, ID band Patient awake    Reviewed: Allergy & Precautions, NPO status , Patient's Chart, lab work & pertinent test results  History of Anesthesia Complications Negative for: history of anesthetic complications  Airway Mallampati: I  TM Distance: >3 FB Neck ROM: Full    Dental  (+) Partial Upper, Partial Lower, Dental Advisory Given   Pulmonary shortness of breath, sleep apnea , former smoker,    breath sounds clear to auscultation       Cardiovascular hypertension, (-) CAD and (-) Past MI  Rhythm:Regular     Neuro/Psych  Neuromuscular disease negative psych ROS   GI/Hepatic Neg liver ROS, GERD  Medicated and Controlled,  Endo/Other  neg diabetesMorbid obesity  Renal/GU negative Renal ROS     Musculoskeletal  (+) Arthritis ,   Abdominal   Peds  Hematology  (+) anemia ,   Anesthesia Other Findings   Reproductive/Obstetrics                           Anesthesia Physical Anesthesia Plan  ASA: II  Anesthesia Plan: General and Regional   Post-op Pain Management:  Regional for Post-op pain   Induction: Intravenous  PONV Risk Score and Plan: 2  Airway Management Planned: LMA and Oral ETT  Additional Equipment: None  Intra-op Plan:   Post-operative Plan: Extubation in OR  Informed Consent: I have reviewed the patients History and Physical, chart, labs and discussed the procedure including the risks, benefits and alternatives for the proposed anesthesia with the patient or authorized representative who has indicated his/her understanding and acceptance.   Dental advisory given  Plan Discussed with: CRNA and Surgeon  Anesthesia Plan Comments:         Anesthesia Quick Evaluation

## 2017-09-25 NOTE — Interval H&P Note (Signed)
History and Physical Interval Note:  09/25/2017 4:23 PM  Murlean Caller Jaspers  has presented today for surgery, with the diagnosis of left knee osteoarthritis  The various methods of treatment have been discussed with the patient and family. After consideration of risks, benefits and other options for treatment, the patient has consented to  Procedure(s): LEFT TOTAL KNEE ARTHROPLASTY (Left) as a surgical intervention .  The patient's history has been reviewed, patient examined, no change in status, stable for surgery.  I have reviewed the patient's chart and labs.  Questions were answered to the patient's satisfaction.     Keelyn Fjelstad ANDREW

## 2017-09-26 ENCOUNTER — Other Ambulatory Visit: Payer: Self-pay

## 2017-09-26 LAB — BASIC METABOLIC PANEL
Anion gap: 8 (ref 5–15)
BUN: 13 mg/dL (ref 6–20)
CO2: 26 mmol/L (ref 22–32)
Calcium: 9 mg/dL (ref 8.9–10.3)
Chloride: 102 mmol/L (ref 101–111)
Creatinine, Ser: 0.68 mg/dL (ref 0.61–1.24)
GFR calc Af Amer: >60 mL/min (ref >60)
GFR calc non Af Amer: >60 mL/min (ref >60)
Glucose, Bld: 144 mg/dL — ABNORMAL HIGH (ref 65–99)
Potassium: 4.1 mmol/L (ref 3.5–5.1)
Sodium: 136 mmol/L (ref 135–145)

## 2017-09-26 LAB — CBC
HCT: 34.6 % — ABNORMAL LOW (ref 39.0–52.0)
Hemoglobin: 10.9 g/dL — ABNORMAL LOW (ref 13.0–17.0)
MCH: 27.5 pg (ref 26.0–34.0)
MCHC: 31.5 g/dL (ref 30.0–36.0)
MCV: 87.4 fL (ref 78.0–100.0)
Platelets: 213 10*3/uL (ref 150–400)
RBC: 3.96 MIL/uL — ABNORMAL LOW (ref 4.22–5.81)
RDW: 20.5 % — ABNORMAL HIGH (ref 11.5–15.5)
WBC: 9.5 10*3/uL (ref 4.0–10.5)

## 2017-09-26 NOTE — Evaluation (Signed)
Occupational Therapy Evaluation Patient Details Name: [name] MRN: [id number] DOB: Jan 02, 1941 Today's Date: 09/26/2017    History of Present Illness Pt s/p Lt TKA. PMH includes anemia, dyspnea, LBP, hyperlipidemia, OA, GERD, CTS, BPH.    Clinical Impression   Pt s/p above. Pt independent with ADLs, PTA. Feel pt will benefit from acute OT to increase independence prior to d/c. Do not feel pt will need follow up OT upon d/c.     Follow Up Recommendations  No OT follow up;Supervision - Intermittent    Equipment Recommendations  Other (comment)(may benefit from AE )    Recommendations for Other Services       Precautions / Restrictions Precautions Precautions: Knee;Fall Precaution Booklet Issued: No Precaution Comments: educated on knee precautions Required Braces or Orthoses: Knee Immobilizer - Left Knee Immobilizer - Left: Other (comment)(when walking per order) Restrictions Weight Bearing Restrictions: Yes LLE Weight Bearing: Weight bearing as tolerated      Mobility Bed Mobility Overal bed mobility: Needs Assistance Bed Mobility: Supine to Sit     Supine to sit: Supervision        Transfers Overall transfer level: Needs assistance Equipment used: Rolling walker (2 wheeled) Transfers: Sit to/from Stand Sit to Stand: Min guard              Balance    Used RW for ambulation-No LOB noted.                                       ADL either performed or assessed with clinical judgement   ADL Overall ADL's : Needs assistance/impaired                     Lower Body Dressing: Minimal assistance;Sit to/from stand   Toilet Transfer: Ambulation;Min guard;RW(sit to stand from bed)           Functional mobility during ADLs: Min guard;Rolling walker General ADL Comments: Educated on LB dressing technique and talked about AE.     Vision         Perception     Praxis      Pertinent Vitals/Pain Pain Assessment:  0-10 Pain Score: 5  Pain Location: left knee Pain Descriptors / Indicators: Burning;Sore Pain Intervention(s): Monitored during session;Repositioned     Hand Dominance     Extremity/Trunk Assessment Upper Extremity Assessment Upper Extremity Assessment: Overall WFL for tasks assessed   Lower Extremity Assessment Lower Extremity Assessment: Defer to PT evaluation       Communication Communication Communication: No difficulties;Other (comment)(has hearing aids)   Cognition Arousal/Alertness: Awake/alert Behavior During Therapy: WFL for tasks assessed/performed Overall Cognitive Status: Within Functional Limits for tasks assessed                                     General Comments       Exercises     Shoulder Instructions      Home Living Family/patient expects to be discharged to:: Private residence Living Arrangements: Spouse/significant other Available Help at Discharge: Family;Friend(s);Available 24 hours/day Type of Home: Other(Comment)(condo)       Home Layout: Two level;Able to live on main level with bedroom/bathroom     Bathroom Shower/Tub: Occupational psychologist: (elevated toilets)     Home Equipment: Environmental consultant - 2 wheels;Cane - single point;Grab  bars - toilet          Prior Functioning/Environment Level of Independence: Independent with assistive device(s)        Comments: used cane at times        OT Problem List: Decreased strength;Decreased range of motion;Decreased activity tolerance;Decreased knowledge of use of DME or AE;Decreased knowledge of precautions;Pain      OT Treatment/Interventions: Self-care/ADL training;DME and/or AE instruction;Therapeutic activities;Patient/family education;Balance training    OT Goals(Current goals can be found in the care plan section) Acute Rehab OT Goals Patient Stated Goal: ride his bike OT Goal Formulation: With patient Time For Goal Achievement: 10/03/17 Potential to  Achieve Goals: Good ADL Goals Pt Will Perform Lower Body Dressing: with set-up;with supervision;sit to/from stand Pt Will Transfer to Toilet: grab bars;with modified independence Pt Will Perform Tub/Shower Transfer: Shower transfer;with supervision;shower seat;rolling walker  OT Frequency: Min 2X/week   Barriers to D/C:            Co-evaluation              AM-PAC PT "6 Clicks" Daily Activity     Outcome Measure Help from another person eating meals?: None Help from another person taking care of personal grooming?: A Little Help from another person toileting, which includes using toliet, bedpan, or urinal?: A Little Help from another person bathing (including washing, rinsing, drying)?: A Little Help from another person to put on and taking off regular upper body clothing?: A Little Help from another person to put on and taking off regular lower body clothing?: A Little 6 Click Score: 19   End of Session Equipment Utilized During Treatment: Gait belt;Rolling walker;Left knee immobilizer;Other (comment)(sock aid)  Activity Tolerance: Patient tolerated treatment well Patient left: in chair;with call bell/phone within reach  OT Visit Diagnosis: Pain Pain - Right/Left: Left Pain - part of body: Knee                Time: 4585-9292 OT Time Calculation (min): 18 min Charges:  OT General Charges $OT Visit: 1 Visit OT Evaluation $OT Eval Moderate Complexity: 1 Mod G-Codes:      Kateri Balch L Gracielynn Birkel OTR/L 09/26/2017, 9:47 AM

## 2017-09-26 NOTE — Progress Notes (Signed)
Physical Therapy Treatment Patient Details Name: [name] MRN: [id number] DOB: [birth year] Today's Date: 09/26/2017    History of Present Illness Pt s/p Lt TKA on 09/25/2017 . PMH includes anemia, dyspnea, LBP, hyperlipidemia, OA, GERD, CTS, BPH, and R TKA 2003, lumbar fusion 2012.    PT Comments    Pt tolerated 2nd session well today. Pt with increased control and muscle activity of LLE and worked on seated knee flexion stretches. Pt wold like to DC tomorrow by 10:30 am if all goes well. Will see pt tomorrow    Follow Up Recommendations  Home health PT(per pt stated MD mentioned [facility] then OP )     Equipment Recommendations  None recommended by PT    Recommendations for Other Services       Precautions / Restrictions Precautions Precautions: Knee;Fall Precaution Booklet Issued: No Precaution Comments: educated on knee precautions Required Braces or Orthoses: Knee Immobilizer - Left Knee Immobilizer - Left: (when walking per order) Restrictions Weight Bearing Restrictions: Yes LLE Weight Bearing: Weight bearing as tolerated    Mobility  Bed Mobility Overal bed mobility: Needs Assistance Bed Mobility: Sit to Supine     Supine to sit: Supervision Sit to supine: Supervision      Transfers Overall transfer level: Needs assistance Equipment used: Rolling walker (2 wheeled) Transfers: Sit to/from Stand Sit to Stand: Min guard         General transfer comment: cues for RW safety and hand placement   Ambulation/Gait Ambulation/Gait assistance: Min guard Ambulation Distance (Feet): 150 Feet Assistive device: Rolling walker (2 wheeled) Gait Pattern/deviations: Step-through pattern         Stairs            Wheelchair Mobility    Modified Rankin (Stroke Patients Only)       Balance                                            Cognition Arousal/Alertness: Awake/alert Behavior During Therapy: WFL for tasks  assessed/performed Overall Cognitive Status: Within Functional Limits for tasks assessed                                        Exercises Total Joint Exercises Ankle Circles/Pumps: Supine;Left;AROM;10 reps Quad Sets: 10 reps;AROM;Left;Supine Heel Slides: AAROM;10 reps;Left;Supine Straight Leg Raises: AROM;10 reps;Left;Supine Long Arc Quad: Left;10 reps;Seated Knee Flexion: AAROM;5 reps;Left Goniometric ROM: 0-75    General Comments        Pertinent Vitals/Pain Pain Score: 2  Pain Location: left knee Pain Descriptors / Indicators: Aching Pain Intervention(s): Monitored during session;Ice applied    Home Living Family/patient expects to be discharged to:: Private residence Living Arrangements: Spouse/significant other Available Help at Discharge: Family;Friend(s);Available 24 hours/day Type of Home: Other(Comment)(condo) Home Access: Level entry   Home Layout: Two level;Able to live on main level with bedroom/bathroom Home Equipment: Gilford Rile - 2 wheels;Cane - single point;Grab bars - toilet      Prior Function Level of Independence: Independent with assistive device(s)      Comments: used cane at times   PT Goals (current goals can now be found in the care plan section) Acute Rehab PT Goals Patient Stated Goal: ride his bike PT Goal Formulation: With patient Time For Goal Achievement: 10/10/17 Potential to Achieve Goals:  Good Progress towards PT goals: Progressing toward goals    Frequency    7X/week      PT Plan      Co-evaluation              AM-PAC PT "6 Clicks" Daily Activity  Outcome Measure  Difficulty turning over in bed (including adjusting bedclothes, sheets and blankets)?: None Difficulty moving from lying on back to sitting on the side of the bed? : None Difficulty sitting down on and standing up from a chair with arms (e.g., wheelchair, bedside commode, etc,.)?: None Help needed moving to and from a bed to chair  (including a wheelchair)?: A Little Help needed walking in hospital room?: A Little Help needed climbing 3-5 steps with a railing? : A Little 6 Click Score: 21    End of Session Equipment Utilized During Treatment: Gait belt Activity Tolerance: Patient tolerated treatment well Patient left: in chair;with family/visitor present Nurse Communication: Mobility status PT Visit Diagnosis: Other abnormalities of gait and mobility (R26.89)     Time: 2820-6015 PT Time Calculation (min) (ACUTE ONLY): 20 min  Charges:  $Gait Training: 8-22 mins $Therapeutic Exercise: 8-22 mins                    G Codes:       [name], PT Pager: [phone] 09/26/2017    Josiephine Simao, Gatha Mayer 09/26/2017, 5:14 PM

## 2017-09-26 NOTE — Progress Notes (Signed)
PT Note  Patient Details Name: [name] MRN: [id number] DOB: 10/21/1940   Eval completed this morning , pt did very well, full note to follow. Pt stated his MD discussed HHPT as his plan, however he has all equipment, not equipment needs at this time.   [name] 09/26/2017, 10:45 AM  [name], PT Pager: [phone] 09/26/2017

## 2017-09-26 NOTE — Evaluation (Signed)
Physical Therapy Evaluation Patient Details Name: [name] MRN: [id number] DOB: 1940/10/26 Today's Date: 09/26/2017   History of Present Illness  Pt s/p Lt TKA on 09/25/2017 . PMH includes anemia, dyspnea, LBP, hyperlipidemia, OA, GERD, CTS, BPH, and R TKA 2003, lumbar fusion 2012.  Clinical Impression  Pt is s/p TKA resulting in the deficits listed below (see PT Problem List).  Pt will benefit from PT to increase their independence and safety with mobility to allow discharge home with wife.      Follow Up Recommendations Home health PT(per pt stated MD mentioned [facility] then OP )    Equipment Recommendations  None recommended by PT    Recommendations for Other Services       Precautions / Restrictions Precautions Precautions: Knee;Fall Precaution Booklet Issued: No Precaution Comments: educated on knee precautions Required Braces or Orthoses: Knee Immobilizer - Left Knee Immobilizer - Left: Other (comment)(when walking per order) Restrictions Weight Bearing Restrictions: Yes LLE Weight Bearing: Weight bearing as tolerated      Mobility  Bed Mobility Overal bed mobility: Needs Assistance Bed Mobility: Supine to Sit     Supine to sit: Supervision        Transfers Overall transfer level: Needs assistance Equipment used: Rolling walker (2 wheeled) Transfers: Sit to/from Stand Sit to Stand: Min guard         General transfer comment: cues for RW safety and hand placement   Ambulation/Gait Ambulation/Gait assistance: Min guard Ambulation Distance (Feet): 150 Feet Assistive device: Rolling walker (2 wheeled) Gait Pattern/deviations: Step-through pattern        Stairs            Wheelchair Mobility    Modified Rankin (Stroke Patients Only)       Balance                                             Pertinent Vitals/Pain Pain Score: 2  Pain Location: left knee Pain Descriptors / Indicators: Aching Pain Intervention(s):  Monitored during session;Ice applied    Home Living Family/patient expects to be discharged to:: Private residence Living Arrangements: Spouse/significant other Available Help at Discharge: Family;Friend(s);Available 24 hours/day Type of Home: Other(Comment)(condo) Home Access: Level entry     Home Layout: Two level;Able to live on main level with bedroom/bathroom Home Equipment: Gilford Rile - 2 wheels;Cane - single point;Grab bars - toilet      Prior Function Level of Independence: Independent with assistive device(s)         Comments: used cane at times     Hand Dominance        Extremity/Trunk Assessment        Lower Extremity Assessment Lower Extremity Assessment: Overall WFL for tasks assessed(could perform SLR 10x during our session )       Communication   Communication: No difficulties;Other (comment)(has hearing aids)  Cognition Arousal/Alertness: Awake/alert Behavior During Therapy: WFL for tasks assessed/performed Overall Cognitive Status: Within Functional Limits for tasks assessed                                        General Comments      Exercises Total Joint Exercises Ankle Circles/Pumps: Supine;Left;AROM;10 reps Quad Sets: 10 reps;AROM;Left;Supine Heel Slides: AAROM;10 reps;Left;Supine Straight Leg Raises: AROM;10 reps;Left;Supine Goniometric ROM: 0-75  Assessment/Plan    PT Assessment Patient needs continued PT services  PT Problem List Decreased strength;Decreased range of motion;Decreased activity tolerance;Decreased mobility       PT Treatment Interventions Gait training;Stair training;DME instruction;Functional mobility training;Therapeutic activities;Therapeutic exercise;Patient/family education    PT Goals (Current goals can be found in the Care Plan section)  Acute Rehab PT Goals Patient Stated Goal: ride his bike PT Goal Formulation: With patient Time For Goal Achievement: 10/10/17 Potential to Achieve Goals:  Good    Frequency 7X/week   Barriers to discharge        Co-evaluation               AM-PAC PT "6 Clicks" Daily Activity  Outcome Measure Difficulty turning over in bed (including adjusting bedclothes, sheets and blankets)?: A Little Difficulty moving from lying on back to sitting on the side of the bed? : A Little Difficulty sitting down on and standing up from a chair with arms (e.g., wheelchair, bedside commode, etc,.)?: A Little Help needed moving to and from a bed to chair (including a wheelchair)?: A Little Help needed walking in hospital room?: A Little Help needed climbing 3-5 steps with a railing? : A Little 6 Click Score: 18    End of Session Equipment Utilized During Treatment: Gait belt Activity Tolerance: Patient tolerated treatment well Patient left: in chair;with family/visitor present Nurse Communication: Mobility status PT Visit Diagnosis: Other abnormalities of gait and mobility (R26.89)    Time: 1005-1040 PT Time Calculation (min) (ACUTE ONLY): 35 min   Charges:   PT Evaluation $PT Eval Low Complexity: 1 Low PT Treatments $Gait Training: 8-22 mins $Therapeutic Exercise: 8-22 mins   PT G Codes:        [name], PT Pager: [phone] 09/26/2017   [name], [name] 09/26/2017, 1:51 PM

## 2017-09-26 NOTE — Progress Notes (Signed)
Offered patient CPAP as ordered, however, pt. States that he did not want to wear and he brought his from home if he changes his mind.  RN aware.

## 2017-09-26 NOTE — Progress Notes (Addendum)
Subjective: 1 Day Post-Op Procedure(s) (LRB): LEFT TOTAL KNEE ARTHROPLASTY (Left) Patient reports pain as 2 on 0-10 scale.   [name] is doing very well with his pain well-controlled. He is tolerating meals with no difficulty. He has not been weightbearing yet but will start today. He denies fever, chills, nausea, calf pain, chest pain, shortness of breath, numbness, headaches, or drainage.    Objective: Vital signs in last 24 hours: Temp:  [97.7 F (36.5 C)-98.4 F (36.9 C)] 98.4 F (36.9 C) (02/02 0552) Pulse Rate:  [70-95] 72 (02/02 0552) Resp:  [8-23] 18 (02/02 0552) BP: (110-159)/(44-109) 133/71 (02/02 0552) SpO2:  [89 %-100 %] 100 % (02/02 0552) Weight:  [113.9 kg (251 lb)] 113.9 kg (251 lb) (02/01 1313)  Intake/Output from previous day: 02/01 0701 - 02/02 0700 In: 2650 [P.O.:300; I.V.:2200; IV Piggyback:150] Out: 1960 [Urine:1880; Drains:30; Blood:50] Intake/Output this shift: No intake/output data recorded.  Recent Labs    09/26/17 0615  HGB 10.9*   Recent Labs    09/26/17 0615  WBC 9.5  RBC 3.96*  HCT 34.6*  PLT 213   Recent Labs    09/26/17 0615  NA 136  K 4.1  CL 102  CO2 26  BUN 13  CREATININE 0.68  GLUCOSE 144*  CALCIUM 9.0   No results for input(s): LABPT, INR in the last 72 hours.  Neurologically intact Neurovascular intact Sensation intact distally Intact pulses distally Dorsiflexion/Plantar flexion intact Incision: dressing C/D/I and no drainage Compartment soft  Assessment/Plan: 1 Day Post-Op Procedure(s) (LRB): LEFT TOTAL KNEE ARTHROPLASTY (Left) Plan for discharge tomorrow  The drain was pulled today and a bandage was placed over the wound. Continue with diet as tolerated.  Weightbearing as tolerated.  Pain management as needed.     Brynda Peon 09/26/2017, 8:19 AM

## 2017-09-27 LAB — CBC
HCT: 29.8 % — ABNORMAL LOW (ref 39.0–52.0)
Hemoglobin: 9.5 g/dL — ABNORMAL LOW (ref 13.0–17.0)
MCH: 27.7 pg (ref 26.0–34.0)
MCHC: 31.9 g/dL (ref 30.0–36.0)
MCV: 86.9 fL (ref 78.0–100.0)
Platelets: 208 10*3/uL (ref 150–400)
RBC: 3.43 MIL/uL — ABNORMAL LOW (ref 4.22–5.81)
RDW: 21 % — ABNORMAL HIGH (ref 11.5–15.5)
WBC: 10.3 10*3/uL (ref 4.0–10.5)

## 2017-09-27 MED ORDER — HYDROCODONE-ACETAMINOPHEN 10-325 MG PO TABS
1.0000 | ORAL_TABLET | ORAL | Status: DC | PRN
  Administered 2017-09-27: 1 via ORAL
  Filled 2017-09-27: qty 1

## 2017-09-27 MED ORDER — HYDROCODONE-ACETAMINOPHEN 10-325 MG PO TABS: 1.0000 | ORAL_TABLET | ORAL | 0 refills | Status: AC | PRN

## 2017-09-27 NOTE — Progress Notes (Signed)
Physical Therapy Treatment Patient Details Name: [name] MRN: [id number] DOB: 1941-04-20 Today's Date: 09/27/2017    History of Present Illness Pt s/p Lt TKA on 09/25/2017 . PMH includes anemia, dyspnea, LBP, hyperlipidemia, OA, GERD, CTS, BPH, and R TKA 2003, lumbar fusion 2012.    PT Comments    Noted from pt's nurse, OT and pt's wife that he had increased confusion last night and this morning. He is still progressing with his mobility and able to follow exercises. Handout given to wife , and will assess pt this afternoon as well.     Follow Up Recommendations  Home health PT(per pt stated MD mentioned [facility] then [facility] )     Equipment Recommendations  None recommended by PT    Recommendations for Other Services       Precautions / Restrictions Precautions Precautions: Knee;Fall Precaution Comments: educated in no pillow behind knee, and how to position roll under achilles to improve knee extension, as well as roll to Left side of knee to prevent ER and rolling to the side.  Required Braces or Orthoses: (DC KI today due to control and strength during exercise and mobility. ) Knee Immobilizer - Left: Other (comment)(when walking per order) Restrictions Weight Bearing Restrictions: No LLE Weight Bearing: Weight bearing as tolerated    Mobility  Bed Mobility Overal bed mobility: Needs Assistance Bed Mobility: Sit to Supine       Sit to supine: Supervision      Transfers Overall transfer level: Needs assistance Equipment used: Rolling walker (2 wheeled) Transfers: Sit to/from Stand Sit to Stand: Min guard         General transfer comment: cues for RW safety and hand placement   Ambulation/Gait Ambulation/Gait assistance: Min guard Ambulation Distance (Feet): 150 Feet Assistive device: Rolling walker (2 wheeled) Gait Pattern/deviations: Step-through pattern         Stairs            Wheelchair Mobility    Modified Rankin (Stroke Patients  Only)       Balance                                            Cognition Arousal/Alertness: Awake/alert Behavior During Therapy: WFL for tasks assessed/performed Overall Cognitive Status: Impaired/Different from baseline Area of Impairment: Orientation;Memory;Safety/judgement                                      Exercises Total Joint Exercises Ankle Circles/Pumps: Left;AROM;10 reps;Seated Quad Sets: 10 reps;AROM;Left;Seated Heel Slides: AAROM;10 reps;Left;Seated Straight Leg Raises: AROM;10 reps;Left;Seated Long Arc Quad: Left;5 reps;Seated;AAROM Knee Flexion: AAROM;Left;Seated;5 reps Goniometric ROM: 0-80    General Comments        Pertinent Vitals/Pain Pain Score: 5  Pain Location: left knee Pain Descriptors / Indicators: Aching;Burning Pain Intervention(s): Premedicated before session;Ice applied;Monitored during session    Home Living                      Prior Function            PT Goals (current goals can now be found in the care plan section) Acute Rehab PT Goals Patient Stated Goal: ride his bike PT Goal Formulation: With patient Time For Goal Achievement: 10/10/17 Potential to Achieve Goals: Good Progress towards  PT goals: Progressing toward goals    Frequency    7X/week      PT Plan Current plan remains appropriate    Co-evaluation              AM-PAC PT "6 Clicks" Daily Activity  Outcome Measure  Difficulty turning over in bed (including adjusting bedclothes, sheets and blankets)?: None Difficulty moving from lying on back to sitting on the side of the bed? : None Difficulty sitting down on and standing up from a chair with arms (e.g., wheelchair, bedside commode, etc,.)?: None Help needed moving to and from a bed to chair (including a wheelchair)?: A Little Help needed walking in hospital room?: A Little Help needed climbing 3-5 steps with a railing? : A Little 6 Click Score: 21     End of Session Equipment Utilized During Treatment: Gait belt Activity Tolerance: Patient tolerated treatment well Patient left: in chair;with family/visitor present Nurse Communication: Mobility status PT Visit Diagnosis: Other abnormalities of gait and mobility (R26.89)     Time: 6384-6659 PT Time Calculation (min) (ACUTE ONLY): 43 min  Charges:  $Gait Training: 8-22 mins $Therapeutic Exercise: 8-22 mins $Therapeutic Activity: 8-22 mins                    G Codes:       [name], PT Pager: [phone] 09/27/2017    Maxwel Meadowcroft, Gatha Mayer 09/27/2017, 1:26 PM

## 2017-09-27 NOTE — Progress Notes (Signed)
Pt. Does not want to wear CPAP at this time.

## 2017-09-27 NOTE — Progress Notes (Signed)
    Subjective: 2 Days Post-Op Procedure(s) (LRB): LEFT TOTAL KNEE ARTHROPLASTY (Left) Patient reports pain as 8 on 0-10 scale.   Denies CP or SOB.  Voiding without difficulty. Positive flatus.  Pt is somewhat confused today and had sundowners last evening. Pt has a hx of confusion after anesthesia/pain medication. Objective: Vital signs in last 24 hours: Temp:  [98.1 F (36.7 C)-98.9 F (37.2 C)] 98.8 F (37.1 C) (02/03 0606) Pulse Rate:  [71-81] 78 (02/03 0606) Resp:  [16-17] 17 (02/03 0606) BP: (105-119)/(48-59) 114/59 (02/03 0606) SpO2:  [94 %] 94 % (02/03 0606)  Intake/Output from previous day: 02/02 0701 - 02/03 0700 In: 1647.5 [P.O.:1260; I.V.:387.5] Out: 425 [Urine:425] Intake/Output this shift: No intake/output data recorded.  Labs: Recent Labs    09/26/17 0615 09/27/17 0542  HGB 10.9* 9.5*   Recent Labs    09/26/17 0615 09/27/17 0542  WBC 9.5 10.3  RBC 3.96* 3.43*  HCT 34.6* 29.8*  PLT 213 208   Recent Labs    09/26/17 0615  NA 136  K 4.1  CL 102  CO2 26  BUN 13  CREATININE 0.68  GLUCOSE 144*  CALCIUM 9.0   No results for input(s): LABPT, INR in the last 72 hours.  Physical Exam: ABD soft Sensation intact distally Dorsiflexion/Plantar flexion intact Incision: dressing C/D/I Compartment soft Body mass index is 34.04 kg/m.   Assessment/Plan: 2 Days Post-Op Procedure(s) (LRB): LEFT TOTAL KNEE ARTHROPLASTY (Left) Up with therapy  I have discontinued oxycotin/oxycodone/Benadryl and Ambien I have started Hydrocodone to see if his confusion will improve I have spoken to his wife and do not feel like I can send him home.   I will be in contact with nurse today  [place], Darla Lesches for Dr. Melina Schools [facility] [phone] 09/27/2017, 8:24 AM

## 2017-09-27 NOTE — Progress Notes (Signed)
Occupational Therapy Treatment Patient Details Name: [name] MRN: [id number] DOB: 1940/12/02 Today's Date: 09/27/2017    History of present illness Pt s/p Lt TKA on 09/25/2017 . PMH includes anemia, dyspnea, LBP, hyperlipidemia, OA, GERD, CTS, BPH, and R TKA 2003, lumbar fusion 2012.   OT comments  Pt demonstrating ability to reach L foot for bathing and dressing. Toileting and grooming performed with min guard assist. Educated in shower transfer. Pt with confusion today.  Follow Up Recommendations  No OT follow up;Supervision - Intermittent    Equipment Recommendations  None recommended by OT    Recommendations for Other Services      Precautions / Restrictions Precautions Precautions: Knee;Fall Precaution Comments: educated in no pillow behind knee Required Braces or Orthoses: Knee Immobilizer - Left Knee Immobilizer - Left: Other (comment)(when walking per order) Restrictions Weight Bearing Restrictions: No LLE Weight Bearing: Weight bearing as tolerated       Mobility Bed Mobility Overal bed mobility: Needs Assistance Bed Mobility: Supine to Sit     Supine to sit: Supervision     General bed mobility comments: hooks R LE under L to self assist  Transfers Overall transfer level: Needs assistance Equipment used: Rolling walker (2 wheeled) Transfers: Sit to/from Stand Sit to Stand: Supervision         General transfer comment: good technique    Balance                                           ADL either performed or assessed with clinical judgement   ADL Overall ADL's : Needs assistance/impaired     Grooming: Wash/dry hands;Standing;Min guard               Lower Body Dressing: Supervision/safety;Sit to/from stand Lower Body Dressing Details (indicate cue type and reason): pt demonstrated ability to don and doff L sock, educated in dressing L LE first and then R Toilet Transfer: RW;Ambulation;Min guard       Tub/  Banker: Min guard;Ambulation;Shower Technical sales engineer Details (indicate cue type and reason): instructed to avoid standing to wash bottom and to dry off as much as possible while still seated  Functional mobility during ADLs: Min guard;Rolling walker General ADL Comments: pt recalled education on use of sock aid, pt does not need anymore     Vision       Perception     Praxis      Cognition Arousal/Alertness: Awake/alert Behavior During Therapy: WFL for tasks assessed/performed Overall Cognitive Status: Impaired/Different from baseline Area of Impairment: Orientation;Memory;Safety/judgement                 Orientation Level: Disoriented to;Time;Situation;Place   Memory: Decreased short-term memory   Safety/Judgement: Decreased awareness of safety;Decreased awareness of deficits     General Comments: pt calm at end of session and able to recall education provided, verbally        Exercises     Shoulder Instructions       General Comments      Pertinent Vitals/ Pain       Pain Assessment: Faces Faces Pain Scale: Hurts even more Pain Location: left knee Pain Descriptors / Indicators: Aching Pain Intervention(s): Monitored during session;Premedicated before session;Repositioned;Ice applied  Home Living  Prior Functioning/Environment              Frequency  Min 2X/week        Progress Toward Goals  OT Goals(current goals can now be found in the care plan section)  Progress towards OT goals: Progressing toward goals  Acute Rehab OT Goals Patient Stated Goal: ride his bike OT Goal Formulation: With patient Time For Goal Achievement: 10/03/17 Potential to Achieve Goals: Good  Plan Discharge plan remains appropriate    Co-evaluation                 AM-PAC PT "6 Clicks" Daily Activity     Outcome Measure   Help from another person eating meals?:  None Help from another person taking care of personal grooming?: A Little Help from another person toileting, which includes using toliet, bedpan, or urinal?: A Little Help from another person bathing (including washing, rinsing, drying)?: A Little Help from another person to put on and taking off regular upper body clothing?: None Help from another person to put on and taking off regular lower body clothing?: A Little 6 Click Score: 20    End of Session Equipment Utilized During Treatment: Gait belt;Rolling walker;Left knee immobilizer  OT Visit Diagnosis: Pain Pain - Right/Left: Left Pain - part of body: Knee   Activity Tolerance Patient tolerated treatment well   Patient Left in chair;with call bell/phone within reach;with chair alarm set   Nurse Communication          Time: 3329-5188 OT Time Calculation (min): 36 min  Charges: OT General Charges $OT Visit: 1 Visit OT Treatments $Self Care/Home Management : 23-37 mins  09/27/2017 Nestor Lewandowsky, OTR/L Pager: [phone]   Werner Lean Haze Boyden 09/27/2017, 8:53 AM

## 2017-09-27 NOTE — Progress Notes (Signed)
Physical Therapy Treatment Patient Details Name: [name] MRN: [id number] DOB: [birth year] Today's Date: 09/27/2017    History of Present Illness Pt s/p Lt TKA on 09/25/2017 . PMH includes anemia, dyspnea, LBP, hyperlipidemia, OA, GERD, CTS, BPH, and R TKA 2003, lumbar fusion 2012.    PT Comments    Pt with greater swelling  and pain this afternoon, and still some confusion limited safety awareness at times. Limited walking and mobility as well as exercises this afternoon, however we did continue to do them within his tolerance , and was able to educate wife and daughter on exercise program and progression for home.    Follow Up Recommendations  Home health PT(per pt stated MD mentioned [facility] then [facility] )     Equipment Recommendations  None recommended by PT    Recommendations for Other Services       Precautions / Restrictions Precautions Precautions: Knee;Fall Precaution Comments: educated in no pillow behind knee, and how to position roll under achilles to improve knee extension, as well as roll to Left side of knee to prevent ER and rolling to the side.  Required Braces or Orthoses: (DC KI today due to control and strength during exercise and mobility. ) Knee Immobilizer - Left: Other (comment)(when walking per order) Restrictions Weight Bearing Restrictions: No LLE Weight Bearing: Weight bearing as tolerated    Mobility  Bed Mobility Overal bed mobility: Needs Assistance Bed Mobility: Sit to Supine       Sit to supine: Mod assist   General bed mobility comments: more assist this afternoon due to more pain and swelling. Assist with LLE to lift into bed  Transfers Overall transfer level: Needs assistance Equipment used: Rolling walker (2 wheeled) Transfers: Sit to/from Stand Sit to Stand: Min guard         General transfer comment: cues for RW safety and hand placement   Ambulation/Gait Ambulation/Gait assistance: Min guard Ambulation Distance (Feet): 70  Feet Assistive device: Rolling walker (2 wheeled) Gait Pattern/deviations: Step-to pattern     General Gait Details: slow and step to pattern with small steps. Much more limited with mobility this afternoon due to swelling and soreness with great amounts of pain.    Stairs            Wheelchair Mobility    Modified Rankin (Stroke Patients Only)       Balance                                            Cognition Arousal/Alertness: Awake/alert Behavior During Therapy: WFL for tasks assessed/performed Overall Cognitive Status: Within Functional Limits for tasks assessed                                        Exercises Total Joint Exercises Ankle Circles/Pumps: Supine;Left;AROM;10 reps Quad Sets: 10 reps;AROM;Left;Supine;5 reps Short Arc Quad: AAROM;5 reps;Left;Supine Heel Slides: AAROM;10 reps;Left;Supine Hip ABduction/ADduction: AAROM;10 reps;Left;Supine Straight Leg Raises: AROM;10 reps;Left;Supine Long Arc Quad: AAROM;10 reps;Seated;Left Knee Flexion: AAROM;5 reps;Seated Goniometric ROM: 0-70    General Comments        Pertinent Vitals/Pain Pain Score: 8  Pain Location: L knee. Lots more pain and swelling this afternoon .  Pain Descriptors / Indicators: Aching;Burning;Discomfort;Pressure Pain Intervention(s): Monitored during session;Repositioned;Ice applied;Relaxation;Premedicated before session;Limited activity within  patient's tolerance    Home Living                      Prior Function            PT Goals (current goals can now be found in the care plan section) Acute Rehab PT Goals Patient Stated Goal: ride his bike PT Goal Formulation: With patient Time For Goal Achievement: 10/10/17 Potential to Achieve Goals: Good Progress towards PT goals: Progressing toward goals    Frequency    7X/week      PT Plan Current plan remains appropriate    Co-evaluation              AM-PAC PT "6  Clicks" Daily Activity  Outcome Measure  Difficulty turning over in bed (including adjusting bedclothes, sheets and blankets)?: None Difficulty moving from lying on back to sitting on the side of the bed? : None Difficulty sitting down on and standing up from a chair with arms (e.g., wheelchair, bedside commode, etc,.)?: None Help needed moving to and from a bed to chair (including a wheelchair)?: A Little Help needed walking in hospital room?: A Little Help needed climbing 3-5 steps with a railing? : A Little 6 Click Score: 21    End of Session Equipment Utilized During Treatment: Gait belt Activity Tolerance: Patient tolerated treatment well Patient left: with family/visitor present;in bed;with bed alarm set Nurse Communication: Mobility status PT Visit Diagnosis: Other abnormalities of gait and mobility (R26.89)     Time: 8280-0349 PT Time Calculation (min) (ACUTE ONLY): 30 min  Charges:  $Gait Training: 8-22 mins $Therapeutic Exercise: 8-22 mins                    G Codes:       [name], PT Pager: [phone] 09/27/2017    [name], Gatha Mayer 09/27/2017, 5:31 PM

## 2017-09-28 ENCOUNTER — Telehealth: Payer: Self-pay | Admitting: *Deleted

## 2017-09-28 ENCOUNTER — Encounter (HOSPITAL_COMMUNITY): Payer: Self-pay | Admitting: Specialist

## 2017-09-28 LAB — CBC
HCT: 27.8 % — ABNORMAL LOW (ref 39.0–52.0)
Hemoglobin: 9 g/dL — ABNORMAL LOW (ref 13.0–17.0)
MCH: 28.2 pg (ref 26.0–34.0)
MCHC: 32.4 g/dL (ref 30.0–36.0)
MCV: 87.1 fL (ref 78.0–100.0)
Platelets: 196 10*3/uL (ref 150–400)
RBC: 3.19 MIL/uL — ABNORMAL LOW (ref 4.22–5.81)
RDW: 20.8 % — ABNORMAL HIGH (ref 11.5–15.5)
WBC: 7.5 10*3/uL (ref 4.0–10.5)

## 2017-09-28 MED ORDER — TRAMADOL HCL 50 MG PO TABS: 50.0000 mg | ORAL_TABLET | Freq: Four times a day (QID) | ORAL | 0 refills | Status: DC | PRN

## 2017-09-28 NOTE — Telephone Encounter (Signed)
Pt was on TCM report admitted 09/25/17 for for left total knee arthroplasty. Patient has worsening of pain with activity and weight bearing, pain that interferes with activities of daily living and pain with passive range of motion.  Pt D/C 09/28/17, and will f/u w/ orthopedic Sydnee Cabal, MD in 2 weeks.Lee KitchenJohny Chess

## 2017-09-28 NOTE — Addendum Note (Signed)
Addendum  created 09/28/17 0657 by Lollie Sails, CRNA   Charge Capture section accepted

## 2017-09-28 NOTE — Progress Notes (Signed)
Subjective: Doing well this am. No SOB or CP. Ambulating well. Tolerating P{O's. Confusion over the weekend is improving. Wife at bedside.   Objective: Vital signs in last 24 hours: Temp:  [98.3 F (36.8 C)-100.7 F (38.2 C)] 99.4 F (37.4 C) (02/04 0545) Pulse Rate:  [76-93] 93 (02/04 0545) Resp:  [15-18] 15 (02/04 0545) BP: (90-136)/(44-69) 112/55 (02/04 0545) SpO2:  [92 %-99 %] 94 % (02/04 0545)  Intake/Output from previous day: 02/03 0701 - 02/04 0700 In: 1340 [P.O.:1340] Out: 1600 [Urine:1600] Intake/Output this shift: No intake/output data recorded.  Recent Labs    09/26/17 0615 09/27/17 0542 09/28/17 0554  HGB 10.9* 9.5* 9.0*   Recent Labs    09/27/17 0542 09/28/17 0554  WBC 10.3 7.5  RBC 3.43* 3.19*  HCT 29.8* 27.8*  PLT 208 196   Recent Labs    09/26/17 0615  NA 136  K 4.1  CL 102  CO2 26  BUN 13  CREATININE 0.68  GLUCOSE 144*  CALCIUM 9.0   No results for input(s): LABPT, INR in the last 72 hours.  Alert and oriented x3. RRR, Lungs clear, BS x4. Left Calf soft and non tender. L knee dressing C/D/I. No DVT signs. No signs of infection or compartment syndrome. LLE grossly neurovascularly intact.   Assessment/Plan: S/p Left TKA Change Aquacel  D/c home this afternoon F/u in office in 2 weeks or PRN Follow instructions Up with PT I feel that his confusion will improve more at home Limit Narcotics.     [name] L 09/28/2017, 7:59 AM

## 2017-09-28 NOTE — Progress Notes (Signed)
Physical Therapy Treatment Patient Details Name: [name] MRN: [id number] DOB: 1941-03-03 Today's Date: 09/28/2017    History of Present Illness Pt s/p Lt TKA on 09/25/2017 . PMH includes anemia, dyspnea, LBP, hyperlipidemia, OA, GERD, CTS, BPH, and R TKA 2003, lumbar fusion 2012.    PT Comments    Patient progressing today. Ready for Dc./   Follow Up Recommendations  Home health PT     Equipment Recommendations  None recommended by PT    Recommendations for Other Services       Precautions / Restrictions Precautions Precautions: Knee;Fall Precaution Comments: did not use KI Restrictions LLE Weight Bearing: Weight bearing as tolerated    Mobility  Bed Mobility Overal bed mobility: Needs Assistance Bed Mobility: Supine to Sit     Supine to sit: Supervision     General bed mobility comments: no assist required  Transfers Overall transfer level: Needs assistance Equipment used: Rolling walker (2 wheeled) Transfers: Sit to/from Stand Sit to Stand: Min guard         General transfer comment: cues for RW safety and hand placement   Ambulation/Gait Ambulation/Gait assistance: Min guard Ambulation Distance (Feet): 125 Feet Assistive device: Rolling walker (2 wheeled) Gait Pattern/deviations: Step-to pattern;Step-through pattern     General Gait Details: cues for sequence   Stairs            Wheelchair Mobility    Modified Rankin (Stroke Patients Only)       Balance                                            Cognition Arousal/Alertness: Awake/alert                                            Exercises Total Joint Exercises Ankle Circles/Pumps: Supine;Left;AROM;10 reps Quad Sets: 10 reps;AROM;Left;Supine;5 reps Towel Squeeze: AROM;Both;10 reps Short Arc Quad: AAROM;5 reps;Left;Supine Hip ABduction/ADduction: AAROM;10 reps;Left;Supine Straight Leg Raises: AROM;10 reps;Left;Supine Long Arc Quad:  AAROM;10 reps;Seated;Left Knee Flexion: AAROM;5 reps;Seated Goniometric ROM: 0-70    General Comments        Pertinent Vitals/Pain Pain Score: 3  Pain Location: thigh Pain Descriptors / Indicators: Discomfort;Guarding Pain Intervention(s): Premedicated before session;Monitored during session    Home Living                      Prior Function            PT Goals (current goals can now be found in the care plan section) Acute Rehab PT Goals Patient Stated Goal: go home Progress towards PT goals: Progressing toward goals    Frequency    7X/week      PT Plan      Co-evaluation              AM-PAC PT "6 Clicks" Daily Activity  Outcome Measure  Difficulty turning over in bed (including adjusting bedclothes, sheets and blankets)?: None Difficulty moving from lying on back to sitting on the side of the bed? : None Difficulty sitting down on and standing up from a chair with arms (e.g., wheelchair, bedside commode, etc,.)?: None Help needed moving to and from a bed to chair (including a wheelchair)?: A Little Help needed walking in hospital room?: A  Little Help needed climbing 3-5 steps with a railing? : A Little 6 Click Score: 21    End of Session   Activity Tolerance: Patient tolerated treatment well Patient left: in chair;with call bell/phone within reach Nurse Communication: Mobility status PT Visit Diagnosis: Other abnormalities of gait and mobility (R26.89)     Time: 2355-7322 PT Time Calculation (min) (ACUTE ONLY): 31 min  Charges:  $Gait Training: 8-22 mins $Therapeutic Exercise: 8-22 mins                    G CodesTresa Endo PT [phone]    Claretha Cooper 09/28/2017, 1:26 PM

## 2017-09-28 NOTE — Progress Notes (Signed)
Discharge planning, spoke with patient and spouse at bedside. Have chosen [facility] for HH PT, evaluate and treat. Contacted [facility] for referral. Needs RW and 3n1, contacted [facility] to deliver to room. [phone] 

## 2017-09-29 DIAGNOSIS — G4733 Obstructive sleep apnea (adult) (pediatric): Secondary | ICD-10-CM | POA: Diagnosis not present

## 2017-09-29 DIAGNOSIS — N400 Enlarged prostate without lower urinary tract symptoms: Secondary | ICD-10-CM | POA: Diagnosis not present

## 2017-09-29 DIAGNOSIS — Z9181 History of falling: Secondary | ICD-10-CM | POA: Diagnosis not present

## 2017-09-29 DIAGNOSIS — D500 Iron deficiency anemia secondary to blood loss (chronic): Secondary | ICD-10-CM | POA: Diagnosis not present

## 2017-09-29 DIAGNOSIS — Z87891 Personal history of nicotine dependence: Secondary | ICD-10-CM | POA: Diagnosis not present

## 2017-09-29 DIAGNOSIS — Z471 Aftercare following joint replacement surgery: Secondary | ICD-10-CM | POA: Diagnosis not present

## 2017-09-29 DIAGNOSIS — I10 Essential (primary) hypertension: Secondary | ICD-10-CM | POA: Diagnosis not present

## 2017-09-29 DIAGNOSIS — N139 Obstructive and reflux uropathy, unspecified: Secondary | ICD-10-CM | POA: Diagnosis not present

## 2017-09-29 DIAGNOSIS — Z96652 Presence of left artificial knee joint: Secondary | ICD-10-CM | POA: Diagnosis not present

## 2017-09-29 DIAGNOSIS — I2510 Atherosclerotic heart disease of native coronary artery without angina pectoris: Secondary | ICD-10-CM | POA: Diagnosis not present

## 2017-09-29 NOTE — Addendum Note (Signed)
Addendum  created 09/29/17 0755 by Nolon Nations, MD   [facility] Staff edited

## 2017-09-29 NOTE — Discharge Summary (Signed)
Physician Discharge Summary  Patient ID: [name] MRN: [id number] DOB/AGE: [birth year] 77 y.o.  Admit date: 09/25/2017 Discharge date: 09/28/17 Admission Diagnoses:  Discharge Diagnoses: knee oa Active Problems:   S/P knee replacement   Discharged Condition: good  Hospital Course:  [name] is a 77 y.o. who was admitted to [facility]. They were brought to the operating room on 09/25/2017 and underwent Procedure(s): LEFT TOTAL KNEE ARTHROPLASTY.  Patient tolerated the procedure well and was later transferred to the recovery room and then to the orthopaedic floor for postoperative care.  They were given PO and IV analgesics for pain control following their surgery.  They were given 24 hours of postoperative antibiotics of  Anti-infectives (From admission, onward)   Start     Dose/Rate Route Frequency Ordered Stop   09/26/17 0600  ceFAZolin (ANCEF) IVPB 2g/100 mL premix     2 g 200 mL/hr over 30 Minutes Intravenous On call to O.R. 09/25/17 1332 09/25/17 1753   09/26/17 0000  ceFAZolin (ANCEF) IVPB 2g/100 mL premix     2 g 200 mL/hr over 30 Minutes Intravenous Every 6 hours 09/25/17 2255 09/26/17 0552     and started on DVT prophylaxis in the form of lovenox.   PT and OT were ordered for total joint protocol.  Discharge planning consulted to help with postop disposition and equipment needs.  Patient had a good night on the evening of surgery and started to get up OOB with therapy on day one.  Hemovac drain was pulled without difficulty.  Continued to work with therapy into day two.  Dressing was with normal limits.  The patient had progressed with therapy and meeting their goals. Patient was seen in rounds and was ready to go home.  Consults: n/a  Significant Diagnostic Studies: routine  Treatments: routine  Discharge Exam: Blood pressure (!) 112/55, pulse 93, temperature 99.4 F (37.4 C), temperature source Oral, resp. rate 15, height 6' (1.829 m), weight  113.9 kg (251 lb), SpO2 94 %. Alert and oriented x3. RRR, Lungs clear, BS x4. Left Calf soft and non tender. L knee dressing C/D/I. No DVT signs. No signs of infection or compartment syndrome. LLE grossly neurovascularly intact.   Disposition: 01-Home or Self Care  Discharge Instructions    Call MD / Call 911   Complete by:  As directed    If you experience chest pain or shortness of breath, CALL 911 and be transported to the hospital emergency room.  If you develope a fever above 101 F, pus (white drainage) or increased drainage or redness at the wound, or calf pain, call your surgeon's office.   Constipation Prevention   Complete by:  As directed    Drink plenty of fluids.  Prune juice may be helpful.  You may use a stool softener, such as Colace (over the counter) 100 mg twice a day.  Use MiraLax (over the counter) for constipation as needed.   Diet - low sodium heart healthy   Complete by:  As directed    Discharge instructions   Complete by:  As directed    INSTRUCTIONS AFTER JOINT REPLACEMENT   Remove items at home which could result in a fall. This includes throw rugs or furniture in walking pathways ICE to the affected joint every three hours while awake for 30 minutes at a time, for at least the first 3-5 days, and then as needed for pain and swelling.  Continue to use ice for pain and  swelling. You may notice swelling that will progress down to the foot and ankle.  This is normal after surgery.  Elevate your leg when you are not up walking on it.   Continue to use the breathing machine you got in the hospital (incentive spirometer) which will help keep your temperature down.  It is common for your temperature to cycle up and down following surgery, especially at night when you are not up moving around and exerting yourself.  The breathing machine keeps your lungs expanded and your temperature down.   DIET:  As you were doing prior to hospitalization, we recommend a well-balanced  diet.  DRESSING / WOUND CARE / SHOWERING  Keep the surgical dressing until follow up.  The dressing is water proof, so you can shower without any extra covering.  IF THE DRESSING FALLS OFF or the wound gets wet inside, change the dressing with sterile gauze.  Please use good hand washing techniques before changing the dressing.  Do not use any lotions or creams on the incision until instructed by your surgeon.    ACTIVITY  Increase activity slowly as tolerated, but follow the weight bearing instructions below.   No driving for 6 weeks or until further direction given by your physician.  You cannot drive while taking narcotics.  No lifting or carrying greater than 10 lbs. until further directed by your surgeon. Avoid periods of inactivity such as sitting longer than an hour when not asleep. This helps prevent blood clots.  You may return to work once you are authorized by your doctor.     WEIGHT BEARING   Weight bearing as tolerated with assist device (walker, cane, etc) as directed, use it as long as suggested by your surgeon or therapist, typically at least 4-6 weeks.   EXERCISES  Results after joint replacement surgery are often greatly improved when you follow the exercise, range of motion and muscle strengthening exercises prescribed by your doctor. Safety measures are also important to protect the joint from further injury. Any time any of these exercises cause you to have increased pain or swelling, decrease what you are doing until you are comfortable again and then slowly increase them. If you have problems or questions, call your caregiver or physical therapist for advice.   Rehabilitation is important following a joint replacement. After just a few days of immobilization, the muscles of the leg can become weakened and shrink (atrophy).  These exercises are designed to build up the tone and strength of the thigh and leg muscles and to improve motion. Often times heat used for twenty  to thirty minutes before working out will loosen up your tissues and help with improving the range of motion but do not use heat for the first two weeks following surgery (sometimes heat can increase post-operative swelling).   These exercises can be done on a training (exercise) mat, on the floor, on a table or on a bed. Use whatever works the best and is most comfortable for you.    Use music or television while you are exercising so that the exercises are a pleasant break in your day. This will make your life better with the exercises acting as a break in your routine that you can look forward to.   Perform all exercises about fifteen times, three times per day or as directed.  You should exercise both the operative leg and the other leg as well.   Exercises include:   Quad Sets - Tighten up  the muscle on the front of the thigh (Quad) and hold for 5-10 seconds.   Straight Leg Raises - With your knee straight (if you were given a brace, keep it on), lift the leg to 60 degrees, hold for 3 seconds, and slowly lower the leg.  Perform this exercise against resistance later as your leg gets stronger.  Leg Slides: Lying on your back, slowly slide your foot toward your buttocks, bending your knee up off the floor (only go as far as is comfortable). Then slowly slide your foot back down until your leg is flat on the floor again.  Angel Wings: Lying on your back spread your legs to the side as far apart as you can without causing discomfort.  Hamstring Strength:  Lying on your back, push your heel against the floor with your leg straight by tightening up the muscles of your buttocks.  Repeat, but this time bend your knee to a comfortable angle, and push your heel against the floor.  You may put a pillow under the heel to make it more comfortable if necessary.   A rehabilitation program following joint replacement surgery can speed recovery and prevent re-injury in the future due to weakened muscles. Contact  your doctor or a physical therapist for more information on knee rehabilitation.    CONSTIPATION  Constipation is defined medically as fewer than three stools per week and severe constipation as less than one stool per week.  Even if you have a regular bowel pattern at home, your normal regimen is likely to be disrupted due to multiple reasons following surgery.  Combination of anesthesia, postoperative narcotics, change in appetite and fluid intake all can affect your bowels.   YOU MUST use at least one of the following options; they are listed in order of increasing strength to get the job done.  They are all available over the counter, and you may need to use some, POSSIBLY even all of these options:    Drink plenty of fluids (prune juice may be helpful) and high fiber foods Colace 100 mg by mouth twice a day  Senokot for constipation as directed and as needed Dulcolax (bisacodyl), take with full glass of water  Miralax (polyethylene glycol) once or twice a day as needed.  If you have tried all these things and are unable to have a bowel movement in the first 3-4 days after surgery call either your surgeon or your primary doctor.    If you experience loose stools or diarrhea, hold the medications until you stool forms back up.  If your symptoms do not get better within 1 week or if they get worse, check with your doctor.  If you experience "the worst abdominal pain ever" or develop nausea or vomiting, please contact the office immediately for further recommendations for treatment.   ITCHING:  If you experience itching with your medications, try taking only a single pain pill, or even half a pain pill at a time.  You can also use Benadryl over the counter for itching or also to help with sleep.   TED HOSE STOCKINGS:  Use stockings on both legs until for at least 2 weeks or as directed by physician office. They may be removed at night for sleeping.  MEDICATIONS:  See your medication summary  on the "After Visit Summary" that nursing will review with you.  You may have some home medications which will be placed on hold until you complete the course of blood thinner medication.  It is important for you to complete the blood thinner medication as prescribed.  PRECAUTIONS:  If you experience chest pain or shortness of breath - call 911 immediately for transfer to the hospital emergency department.   If you develop a fever greater that 101 F, purulent drainage from wound, increased redness or drainage from wound, foul odor from the wound/dressing, or calf pain - CONTACT YOUR SURGEON.                                                   FOLLOW-UP APPOINTMENTS:  If you do not already have a post-op appointment, please call the office for an appointment to be seen by your surgeon.  Guidelines for how soon to be seen are listed in your "After Visit Summary", but are typically between 1-4 weeks after surgery.  OTHER INSTRUCTIONS:   Knee Replacement:  Do not place pillow under knee, focus on keeping the knee straight while resting. CPM instructions: 0-90 degrees, 2 hours in the morning, 2 hours in the afternoon, and 2 hours in the evening. Place foam block, curve side up under heel at all times except when in CPM or when walking.  DO NOT modify, tear, cut, or change the foam block in any way.  MAKE SURE YOU:  Understand these instructions.  Get help right away if you are not doing well or get worse.    Thank you for letting us be a part of your medical care team.  It is a privilege we respect greatly.  We hope these instructions will help you stay on track for a fast and full recovery!   Increase activity slowly as tolerated   Complete by:  As directed      Allergies as of 09/28/2017   No Known Allergies     Medication List    STOP taking these medications   aspirin 81 MG chewable tablet Replaced by:  aspirin EC 325 MG tablet     TAKE these medications   aspirin EC 325 MG tablet Take  1 tablet (325 mg total) by mouth daily. Replaces:  aspirin 81 MG chewable tablet   baclofen 10 MG tablet Commonly known as:  LIORESAL Take 1 tablet (10 mg total) by mouth daily.   CO Q-10 PO Take 1 tablet by mouth at bedtime.   DULoxetine 60 MG capsule Commonly known as:  CYMBALTA TAKE 1 CAPSULE BY MOUTH  DAILY What changed:    how much to take  how to take this  when to take this   finasteride 5 MG tablet Commonly known as:  PROSCAR TAKE 1 TABLET BY MOUTH  DAILY What changed:    how much to take  how to take this  when to take this   folic acid 297 MCG tablet Commonly known as:  V-R FOLIC ACID Take 1 tablet (400 mcg total) by mouth daily. What changed:  when to take this   furosemide 20 MG tablet Commonly known as:  LASIX TAKE 1 TO 2 TABLETS BY  MOUTH DAILY AS NEEDED FOR  EDEMA What changed:  See the new instructions.   HYDROcodone-acetaminophen 10-325 MG tablet Commonly known as:  NORCO Take 1 tablet by mouth every 4 (four) hours as needed for up to 5 days for moderate pain.   LUBRICANT EYE DROPS 0.4-0.3 % Soln Generic drug:  Polyethyl Glycol-Propyl Glycol Place 1-2 drops into both eyes daily.   pravastatin 20 MG tablet Commonly known as:  PRAVACHOL Take 1 tablet (20 mg total) by mouth daily. What changed:  when to take this   ranitidine 150 MG tablet Commonly known as:  ZANTAC Take 1 tablet (150 mg total) by mouth 2 (two) times daily. What changed:  when to take this   tamsulosin 0.4 MG Caps capsule Commonly known as:  FLOMAX TAKE 2 CAPSULES BY MOUTH  DAILY What changed:    how much to take  how to take this  when to take this   traMADol 50 MG tablet Commonly known as:  ULTRAM Take 1 tablet (50 mg total) by mouth every 6 (six) hours as needed. What changed:    how much to take  when to take this  reasons to take this   Vitamin B-12 1000 MCG Subl Place 1 tablet (1,000 mcg total) under the tongue daily. What changed:  when to take  this   vitamin C 500 MG tablet Commonly known as:  ASCORBIC ACID Take 1 tablet (500 mg total) by mouth daily. What changed:  when to take this   Vitamin D3 2000 units Tabs Take 2,000 Units by mouth at bedtime.      Follow-up Information    [facility] Follow up.   Specialty:  Home Health Services Why:  physical therapy Contact information: [address] [place] [address] [place] Follow up.   Why:  walker and [address] Contact information: [address] [place] [address] [phone]           Signed: Lajean Manes 09/29/2017, 3:42 PM

## 2017-09-30 DIAGNOSIS — N139 Obstructive and reflux uropathy, unspecified: Secondary | ICD-10-CM | POA: Diagnosis not present

## 2017-09-30 DIAGNOSIS — D500 Iron deficiency anemia secondary to blood loss (chronic): Secondary | ICD-10-CM | POA: Diagnosis not present

## 2017-09-30 DIAGNOSIS — Z96652 Presence of left artificial knee joint: Secondary | ICD-10-CM | POA: Diagnosis not present

## 2017-09-30 DIAGNOSIS — Z471 Aftercare following joint replacement surgery: Secondary | ICD-10-CM | POA: Diagnosis not present

## 2017-09-30 DIAGNOSIS — I10 Essential (primary) hypertension: Secondary | ICD-10-CM | POA: Diagnosis not present

## 2017-09-30 DIAGNOSIS — Z87891 Personal history of nicotine dependence: Secondary | ICD-10-CM | POA: Diagnosis not present

## 2017-09-30 DIAGNOSIS — N400 Enlarged prostate without lower urinary tract symptoms: Secondary | ICD-10-CM | POA: Diagnosis not present

## 2017-09-30 DIAGNOSIS — I2510 Atherosclerotic heart disease of native coronary artery without angina pectoris: Secondary | ICD-10-CM | POA: Diagnosis not present

## 2017-09-30 DIAGNOSIS — Z9181 History of falling: Secondary | ICD-10-CM | POA: Diagnosis not present

## 2017-09-30 DIAGNOSIS — G4733 Obstructive sleep apnea (adult) (pediatric): Secondary | ICD-10-CM | POA: Diagnosis not present

## 2017-10-02 DIAGNOSIS — Z96652 Presence of left artificial knee joint: Secondary | ICD-10-CM | POA: Diagnosis not present

## 2017-10-02 DIAGNOSIS — D500 Iron deficiency anemia secondary to blood loss (chronic): Secondary | ICD-10-CM | POA: Diagnosis not present

## 2017-10-02 DIAGNOSIS — Z87891 Personal history of nicotine dependence: Secondary | ICD-10-CM | POA: Diagnosis not present

## 2017-10-02 DIAGNOSIS — N139 Obstructive and reflux uropathy, unspecified: Secondary | ICD-10-CM | POA: Diagnosis not present

## 2017-10-02 DIAGNOSIS — I2510 Atherosclerotic heart disease of native coronary artery without angina pectoris: Secondary | ICD-10-CM | POA: Diagnosis not present

## 2017-10-02 DIAGNOSIS — Z9181 History of falling: Secondary | ICD-10-CM | POA: Diagnosis not present

## 2017-10-02 DIAGNOSIS — G4733 Obstructive sleep apnea (adult) (pediatric): Secondary | ICD-10-CM | POA: Diagnosis not present

## 2017-10-02 DIAGNOSIS — N400 Enlarged prostate without lower urinary tract symptoms: Secondary | ICD-10-CM | POA: Diagnosis not present

## 2017-10-02 DIAGNOSIS — I10 Essential (primary) hypertension: Secondary | ICD-10-CM | POA: Diagnosis not present

## 2017-10-02 DIAGNOSIS — Z471 Aftercare following joint replacement surgery: Secondary | ICD-10-CM | POA: Diagnosis not present

## 2017-10-05 DIAGNOSIS — I2510 Atherosclerotic heart disease of native coronary artery without angina pectoris: Secondary | ICD-10-CM | POA: Diagnosis not present

## 2017-10-05 DIAGNOSIS — N400 Enlarged prostate without lower urinary tract symptoms: Secondary | ICD-10-CM | POA: Diagnosis not present

## 2017-10-05 DIAGNOSIS — I10 Essential (primary) hypertension: Secondary | ICD-10-CM | POA: Diagnosis not present

## 2017-10-05 DIAGNOSIS — Z471 Aftercare following joint replacement surgery: Secondary | ICD-10-CM | POA: Diagnosis not present

## 2017-10-05 DIAGNOSIS — D500 Iron deficiency anemia secondary to blood loss (chronic): Secondary | ICD-10-CM | POA: Diagnosis not present

## 2017-10-05 DIAGNOSIS — Z87891 Personal history of nicotine dependence: Secondary | ICD-10-CM | POA: Diagnosis not present

## 2017-10-05 DIAGNOSIS — Z9181 History of falling: Secondary | ICD-10-CM | POA: Diagnosis not present

## 2017-10-05 DIAGNOSIS — N139 Obstructive and reflux uropathy, unspecified: Secondary | ICD-10-CM | POA: Diagnosis not present

## 2017-10-05 DIAGNOSIS — G4733 Obstructive sleep apnea (adult) (pediatric): Secondary | ICD-10-CM | POA: Diagnosis not present

## 2017-10-05 DIAGNOSIS — Z96652 Presence of left artificial knee joint: Secondary | ICD-10-CM | POA: Diagnosis not present

## 2017-10-07 DIAGNOSIS — I2510 Atherosclerotic heart disease of native coronary artery without angina pectoris: Secondary | ICD-10-CM | POA: Diagnosis not present

## 2017-10-07 DIAGNOSIS — N139 Obstructive and reflux uropathy, unspecified: Secondary | ICD-10-CM | POA: Diagnosis not present

## 2017-10-07 DIAGNOSIS — N400 Enlarged prostate without lower urinary tract symptoms: Secondary | ICD-10-CM | POA: Diagnosis not present

## 2017-10-07 DIAGNOSIS — Z471 Aftercare following joint replacement surgery: Secondary | ICD-10-CM | POA: Diagnosis not present

## 2017-10-07 DIAGNOSIS — Z87891 Personal history of nicotine dependence: Secondary | ICD-10-CM | POA: Diagnosis not present

## 2017-10-07 DIAGNOSIS — Z9181 History of falling: Secondary | ICD-10-CM | POA: Diagnosis not present

## 2017-10-07 DIAGNOSIS — G4733 Obstructive sleep apnea (adult) (pediatric): Secondary | ICD-10-CM | POA: Diagnosis not present

## 2017-10-07 DIAGNOSIS — D500 Iron deficiency anemia secondary to blood loss (chronic): Secondary | ICD-10-CM | POA: Diagnosis not present

## 2017-10-07 DIAGNOSIS — Z96652 Presence of left artificial knee joint: Secondary | ICD-10-CM | POA: Diagnosis not present

## 2017-10-07 DIAGNOSIS — I10 Essential (primary) hypertension: Secondary | ICD-10-CM | POA: Diagnosis not present

## 2017-10-08 ENCOUNTER — Other Ambulatory Visit: Payer: Self-pay

## 2017-10-08 ENCOUNTER — Other Ambulatory Visit: Payer: Self-pay | Admitting: Hematology and Oncology

## 2017-10-08 DIAGNOSIS — E538 Deficiency of other specified B group vitamins: Secondary | ICD-10-CM

## 2017-10-08 DIAGNOSIS — D500 Iron deficiency anemia secondary to blood loss (chronic): Secondary | ICD-10-CM

## 2017-10-09 ENCOUNTER — Encounter: Payer: Self-pay | Admitting: Hematology and Oncology

## 2017-10-09 ENCOUNTER — Telehealth: Payer: Self-pay | Admitting: Hematology and Oncology

## 2017-10-09 ENCOUNTER — Inpatient Hospital Stay: Payer: Medicare Other

## 2017-10-09 ENCOUNTER — Inpatient Hospital Stay: Payer: Medicare Other | Attending: Hematology and Oncology | Admitting: Hematology and Oncology

## 2017-10-09 DIAGNOSIS — Z471 Aftercare following joint replacement surgery: Secondary | ICD-10-CM | POA: Diagnosis not present

## 2017-10-09 DIAGNOSIS — D500 Iron deficiency anemia secondary to blood loss (chronic): Secondary | ICD-10-CM

## 2017-10-09 DIAGNOSIS — D509 Iron deficiency anemia, unspecified: Secondary | ICD-10-CM | POA: Diagnosis not present

## 2017-10-09 DIAGNOSIS — Z96652 Presence of left artificial knee joint: Secondary | ICD-10-CM | POA: Diagnosis not present

## 2017-10-09 LAB — CBC WITH DIFFERENTIAL/PLATELET
Basophils Absolute: 0.1 10*3/uL (ref 0.0–0.1)
Basophils Relative: 1 %
Eosinophils Absolute: 0.4 10*3/uL (ref 0.0–0.5)
Eosinophils Relative: 6 %
HCT: 30.9 % — ABNORMAL LOW (ref 38.4–49.9)
Hemoglobin: 10.1 g/dL — ABNORMAL LOW (ref 13.0–17.1)
Lymphocytes Relative: 15 %
Lymphs Abs: 0.9 10*3/uL (ref 0.9–3.3)
MCH: 27.6 pg (ref 27.2–33.4)
MCHC: 32.5 g/dL (ref 32.0–36.0)
MCV: 84.9 fL (ref 79.3–98.0)
Monocytes Absolute: 1 10*3/uL — ABNORMAL HIGH (ref 0.1–0.9)
Monocytes Relative: 15 %
Neutro Abs: 3.9 10*3/uL (ref 1.5–6.5)
Neutrophils Relative %: 63 %
Platelets: 397 10*3/uL (ref 140–400)
RBC: 3.64 MIL/uL — ABNORMAL LOW (ref 4.20–5.82)
RDW: 22.9 % — ABNORMAL HIGH (ref 11.0–14.6)
WBC: 6.3 10*3/uL (ref 4.0–10.3)

## 2017-10-09 LAB — FERRITIN: Ferritin: 110 ng/mL (ref 22–316)

## 2017-10-09 NOTE — Telephone Encounter (Signed)
Patient declined AVs and calendar of upcoming February appointments.

## 2017-10-09 NOTE — Progress Notes (Signed)
[place] OFFICE PROGRESS NOTE  [name], [name], MD SUMMARY OF HEMATOLOGIC HISTORY:  His baseline blood work in July of 2013 were within normal limits. He was found to have abnormal CBC from blood drawn on 01/24/2014 which showed hemoglobin of 7 with MCV of 77.3. He was also found to have low serum vitamin B12 and he was given vitamin B12 injection. The patient subsequently had progressive fatigue and anemia. He was hospitalized and was given blood transfusions. Upper endoscopy evaluation was normal. A subsequent colonoscopy was also normal. He received iron infusion and is currently taking oral iron supplement. The patient denies over the counter NSAID ingestion. He is on antiplatelets agents. He was given intramuscular vitamin B-12 injection and iron infusion in October 2015. The patient have recurrence of iron deficiency anemia and started to receive IV iron again in 2018 and 2019  INTERVAL HISTORY: [name] 77 y.o. male returns for further follow-up.  He was recently discharged after knee replacement surgery He did not need blood transfusion then The patient denies any recent signs or symptoms of bleeding such as spontaneous epistaxis, hematuria or hematochezia. He has no infusion reaction from recent intravenous iron infusion He has more strength and energy after recent IV iron. He denies chest pain, shortness of breath or dizziness.  He complain of fatigue.  He denies pica  I have reviewed the past medical history, past surgical history, social history and family history with the patient and they are unchanged from previous note.  ALLERGIES:  has No Known Allergies.  MEDICATIONS:  Current Outpatient Medications  Medication Sig Dispense Refill  . aspirin EC 325 MG tablet Take 1 tablet (325 mg total) by mouth daily. 60 tablet 0  . baclofen (LIORESAL) 10 MG tablet Take 1 tablet (10 mg total) by mouth daily. 50 tablet 1  . Cholecalciferol (VITAMIN D3) 2000  units TABS Take 2,000 Units by mouth at bedtime.    . Coenzyme Q10 (CO Q-10 PO) Take 1 tablet by mouth at bedtime.    . Cyanocobalamin (VITAMIN B-12) 1000 MCG SUBL Place 1 tablet (1,000 mcg total) under the tongue daily. (Patient taking differently: Place 1,000 mcg under the tongue at bedtime. ) 100 tablet 3  . DULoxetine (CYMBALTA) 60 MG capsule TAKE 1 CAPSULE BY MOUTH  DAILY (Patient taking differently: TAKE 1 CAPSULE (60 MG) BY MOUTH  DAILY AT NIGHT.) 90 capsule 1  . finasteride (PROSCAR) 5 MG tablet TAKE 1 TABLET BY MOUTH  DAILY (Patient taking differently: TAKE 1 TABLET (5 MG) BY MOUTH  DAILY AT NIGHT) 90 tablet 3  . folic acid (V-R FOLIC ACID) 350 MCG tablet Take 1 tablet (400 mcg total) by mouth daily. (Patient taking differently: Take 400 mcg by mouth at bedtime. ) 90 tablet 3  . furosemide (LASIX) 20 MG tablet TAKE 1 TO 2 TABLETS BY  MOUTH DAILY AS NEEDED FOR  EDEMA (Patient taking differently: TAKE 1 TABLET (20 MG) BY  MOUTH DAILY IN THE EVENING) 180 tablet 3  . Polyethyl Glycol-Propyl Glycol (LUBRICANT EYE DROPS) 0.4-0.3 % SOLN Place 1-2 drops into both eyes daily.    . pravastatin (PRAVACHOL) 20 MG tablet Take 1 tablet (20 mg total) by mouth daily. (Patient taking differently: Take 20 mg by mouth at bedtime. ) 90 tablet 3  . ranitidine (ZANTAC) 150 MG tablet Take 1 tablet (150 mg total) by mouth 2 (two) times daily. (Patient taking differently: Take 150 mg by mouth at bedtime. ) 180 tablet 3  .  tamsulosin (FLOMAX) 0.4 MG CAPS capsule TAKE 2 CAPSULES BY MOUTH  DAILY (Patient taking differently: TAKE 2 CAPSULES (0.8 MG) BY MOUTH  DAILY AT NIGHT) 180 capsule 1  . traMADol (ULTRAM) 50 MG tablet Take 1 tablet (50 mg total) by mouth every 6 (six) hours as needed. 40 tablet 0  . vitamin C (ASCORBIC ACID) 500 MG tablet Take 1 tablet (500 mg total) by mouth daily. (Patient taking differently: Take 500 mg by mouth at bedtime. ) 100 tablet 1   No current facility-administered medications for this  visit.      REVIEW OF SYSTEMS:   Constitutional: Denies fevers, chills or night sweats Eyes: Denies blurriness of vision Ears, nose, mouth, throat, and face: Denies mucositis or sore throat Respiratory: Denies cough, dyspnea or wheezes Cardiovascular: Denies palpitation, chest discomfort or lower extremity swelling Gastrointestinal:  Denies nausea, heartburn or change in bowel habits Skin: Denies abnormal skin rashes Lymphatics: Denies new lymphadenopathy or easy bruising Neurological:Denies numbness, tingling or new weaknesses Behavioral/Psych: Mood is stable, no new changes  All other systems were reviewed with the patient and are negative.  PHYSICAL EXAMINATION: ECOG PERFORMANCE STATUS: 1 - Symptomatic but completely ambulatory  Vitals:   10/09/17 1021  BP: 123/62  Pulse: 63  Resp: 18  Temp: 97.6 F (36.4 C)  SpO2: 97%   Filed Weights   10/09/17 1021  Weight: 245 lb 14.4 oz (111.5 kg)    GENERAL:alert, no distress and comfortable SKIN: skin color, texture, turgor are normal, no rashes or significant lesions Musculoskeletal:no cyanosis of digits and no clubbing  NEURO: alert & oriented x 3 with fluent speech, no focal motor/sensory deficits  LABORATORY DATA:  I have reviewed the data as listed     Component Value Date/Time   NA 136 09/26/2017 0615   NA 140 05/16/2014 1327   K 4.1 09/26/2017 0615   K 3.8 05/16/2014 1327   CL 102 09/26/2017 0615   CO2 26 09/26/2017 0615   CO2 28 05/16/2014 1327   GLUCOSE 144 (H) 09/26/2017 0615   GLUCOSE 98 05/16/2014 1327   BUN 13 09/26/2017 0615   BUN 17.6 05/16/2014 1327   CREATININE 0.68 09/26/2017 0615   CREATININE 0.82 08/13/2015 0953   CREATININE 0.9 05/16/2014 1327   CALCIUM 9.0 09/26/2017 0615   CALCIUM 9.4 05/16/2014 1327   PROT 6.4 08/13/2015 0953   PROT 6.9 05/16/2014 1327   ALBUMIN 4.2 08/13/2015 0953   ALBUMIN 3.7 05/16/2014 1327   AST 17 08/13/2015 0953   AST 19 05/16/2014 1327   ALT 14 08/13/2015 0953    ALT 9 05/16/2014 1327   ALKPHOS 55 08/13/2015 0953   ALKPHOS 66 05/16/2014 1327   BILITOT 0.6 08/13/2015 0953   BILITOT 0.50 05/16/2014 1327   GFRNONAA >60 09/26/2017 0615   GFRNONAA 87 08/13/2015 0953   GFRAA >60 09/26/2017 0615   GFRAA >89 08/13/2015 0953    No results found for: SPEP, UPEP  Lab Results  Component Value Date   WBC 6.3 10/09/2017   NEUTROABS 3.9 10/09/2017   HGB 10.1 (L) 10/09/2017   HCT 30.9 (L) 10/09/2017   MCV 84.9 10/09/2017   PLT 397 10/09/2017      Chemistry      Component Value Date/Time   NA 136 09/26/2017 0615   NA 140 05/16/2014 1327   K 4.1 09/26/2017 0615   K 3.8 05/16/2014 1327   CL 102 09/26/2017 0615   CO2 26 09/26/2017 0615   CO2 28 05/16/2014 1327  BUN 13 09/26/2017 0615   BUN 17.6 05/16/2014 1327   CREATININE 0.68 09/26/2017 0615   CREATININE 0.82 08/13/2015 0953   CREATININE 0.9 05/16/2014 1327      Component Value Date/Time   CALCIUM 9.0 09/26/2017 0615   CALCIUM 9.4 05/16/2014 1327   ALKPHOS 55 08/13/2015 0953   ALKPHOS 66 05/16/2014 1327   AST 17 08/13/2015 0953   AST 19 05/16/2014 1327   ALT 14 08/13/2015 0953   ALT 9 05/16/2014 1327   BILITOT 0.6 08/13/2015 0953   BILITOT 0.50 05/16/2014 1327      ASSESSMENT & PLAN:  Anemia, iron deficiency The patient had known history of recurrent iron deficiency anemia, thought to be related to GI bleed He denies NSAID but he remain on antiplatelet agents He tolerated oral iron supplement very poorly. The most likely cause of his anemia is due to chronic blood loss/malabsorption syndrome. We discussed some of the risks, benefits, and alternatives of intravenous iron infusions. The patient is symptomatic from anemia and the iron level is critically low. He tolerated oral iron supplement poorly and desires to achieved higher levels of iron faster for adequate hematopoesis. Some of the side-effects to be expected including risks of infusion reactions, phlebitis, headaches, nausea  and fatigue.  The patient is willing to proceed. Patient education material was dispensed.  Goal is to keep ferritin level greater than 50 and resolution of anemia We will proceed with 2 more doses of intravenous iron next 2 weeks and I plan to see him back again in 3 months with further iron infusion as needed He will get his blood count checked through his primary care physician's office   No orders of the defined types were placed in this encounter.   All questions were answered. The patient knows to call the clinic with any problems, questions or concerns. No barriers to learning was detected.  I spent 10 minutes counseling the patient face to face. The total time spent in the appointment was 15 minutes and more than 50% was on counseling.     Heath Lark, MD 2/15/201910:45 AM

## 2017-10-09 NOTE — Assessment & Plan Note (Signed)
The patient had known history of recurrent iron deficiency anemia, thought to be related to GI bleed He denies NSAID but he remain on antiplatelet agents He tolerated oral iron supplement very poorly. The most likely cause of his anemia is due to chronic blood loss/malabsorption syndrome. We discussed some of the risks, benefits, and alternatives of intravenous iron infusions. The patient is symptomatic from anemia and the iron level is critically low. He tolerated oral iron supplement poorly and desires to achieved higher levels of iron faster for adequate hematopoesis. Some of the side-effects to be expected including risks of infusion reactions, phlebitis, headaches, nausea and fatigue.  The patient is willing to proceed. Patient education material was dispensed.  Goal is to keep ferritin level greater than 50 and resolution of anemia We will proceed with 2 more doses of intravenous iron next 2 weeks and I plan to see him back again in 3 months with further iron infusion as needed He will get his blood count checked through his primary care physician's office

## 2017-10-12 ENCOUNTER — Inpatient Hospital Stay: Payer: Medicare Other

## 2017-10-12 VITALS — BP 109/53 | HR 72 | Temp 98.5°F | Resp 17

## 2017-10-12 DIAGNOSIS — D500 Iron deficiency anemia secondary to blood loss (chronic): Secondary | ICD-10-CM

## 2017-10-12 DIAGNOSIS — M25569 Pain in unspecified knee: Secondary | ICD-10-CM | POA: Diagnosis not present

## 2017-10-12 DIAGNOSIS — D509 Iron deficiency anemia, unspecified: Secondary | ICD-10-CM | POA: Diagnosis not present

## 2017-10-12 MED ORDER — SODIUM CHLORIDE 0.9 % IV SOLN
510.0000 mg | Freq: Once | INTRAVENOUS | Status: AC
  Administered 2017-10-12: 510 mg via INTRAVENOUS
  Filled 2017-10-12: qty 17

## 2017-10-12 NOTE — Patient Instructions (Signed)

## 2017-10-15 DIAGNOSIS — M25562 Pain in left knee: Secondary | ICD-10-CM | POA: Diagnosis not present

## 2017-10-19 ENCOUNTER — Inpatient Hospital Stay: Payer: Medicare Other

## 2017-10-19 VITALS — BP 121/57 | HR 70 | Temp 98.6°F | Resp 16

## 2017-10-19 DIAGNOSIS — D509 Iron deficiency anemia, unspecified: Secondary | ICD-10-CM | POA: Diagnosis not present

## 2017-10-19 DIAGNOSIS — D500 Iron deficiency anemia secondary to blood loss (chronic): Secondary | ICD-10-CM

## 2017-10-19 DIAGNOSIS — M25569 Pain in unspecified knee: Secondary | ICD-10-CM | POA: Diagnosis not present

## 2017-10-19 MED ORDER — FERUMOXYTOL INJECTION 510 MG/17 ML
510.0000 mg | Freq: Once | INTRAVENOUS | Status: AC
  Administered 2017-10-19: 510 mg via INTRAVENOUS
  Filled 2017-10-19: qty 17

## 2017-10-19 NOTE — Patient Instructions (Signed)

## 2017-10-22 DIAGNOSIS — M25569 Pain in unspecified knee: Secondary | ICD-10-CM | POA: Diagnosis not present

## 2017-10-26 DIAGNOSIS — M25569 Pain in unspecified knee: Secondary | ICD-10-CM | POA: Diagnosis not present

## 2017-10-29 DIAGNOSIS — M25569 Pain in unspecified knee: Secondary | ICD-10-CM | POA: Diagnosis not present

## 2017-11-02 DIAGNOSIS — M25569 Pain in unspecified knee: Secondary | ICD-10-CM | POA: Diagnosis not present

## 2017-11-05 DIAGNOSIS — M25569 Pain in unspecified knee: Secondary | ICD-10-CM | POA: Diagnosis not present

## 2017-11-09 ENCOUNTER — Other Ambulatory Visit: Payer: Self-pay | Admitting: Internal Medicine

## 2017-11-11 ENCOUNTER — Other Ambulatory Visit (INDEPENDENT_AMBULATORY_CARE_PROVIDER_SITE_OTHER): Payer: Medicare Other

## 2017-11-11 DIAGNOSIS — I10 Essential (primary) hypertension: Secondary | ICD-10-CM | POA: Diagnosis not present

## 2017-11-11 DIAGNOSIS — D509 Iron deficiency anemia, unspecified: Secondary | ICD-10-CM | POA: Diagnosis not present

## 2017-11-11 LAB — IBC PANEL
Iron: 27 ug/dL — ABNORMAL LOW (ref 42–165)
Saturation Ratios: 6.7 % — ABNORMAL LOW (ref 20.0–50.0)
Transferrin: 290 mg/dL (ref 212.0–360.0)

## 2017-11-11 LAB — CBC WITH DIFFERENTIAL/PLATELET
Basophils Absolute: 0.1 10*3/uL (ref 0.0–0.1)
Basophils Relative: 1.2 % (ref 0.0–3.0)
Eosinophils Absolute: 0.3 10*3/uL (ref 0.0–0.7)
Eosinophils Relative: 6.4 % — ABNORMAL HIGH (ref 0.0–5.0)
HCT: 32.2 % — ABNORMAL LOW (ref 39.0–52.0)
Hemoglobin: 10.8 g/dL — ABNORMAL LOW (ref 13.0–17.0)
Lymphocytes Relative: 28.5 % (ref 12.0–46.0)
Lymphs Abs: 1.2 10*3/uL (ref 0.7–4.0)
MCHC: 33.6 g/dL (ref 30.0–36.0)
MCV: 88.5 fl (ref 78.0–100.0)
Monocytes Absolute: 0.9 10*3/uL (ref 0.1–1.0)
Monocytes Relative: 20.9 % — ABNORMAL HIGH (ref 3.0–12.0)
Neutro Abs: 1.9 10*3/uL (ref 1.4–7.7)
Neutrophils Relative %: 43 % (ref 43.0–77.0)
Platelets: 255 10*3/uL (ref 150.0–400.0)
RBC: 3.64 Mil/uL — ABNORMAL LOW (ref 4.22–5.81)
RDW: 19.9 % — ABNORMAL HIGH (ref 11.5–15.5)
WBC: 4.3 10*3/uL (ref 4.0–10.5)

## 2017-11-11 LAB — BASIC METABOLIC PANEL
BUN: 13 mg/dL (ref 6–23)
CO2: 27 mEq/L (ref 19–32)
Calcium: 9.8 mg/dL (ref 8.4–10.5)
Chloride: 103 mEq/L (ref 96–112)
Creatinine, Ser: 0.82 mg/dL (ref 0.40–1.50)
GFR: 96.92 mL/min (ref >60.00)
Glucose, Bld: 102 mg/dL — ABNORMAL HIGH (ref 70–99)
Potassium: 4.3 mEq/L (ref 3.5–5.1)
Sodium: 142 mEq/L (ref 135–145)

## 2017-11-12 ENCOUNTER — Telehealth: Payer: Self-pay

## 2017-11-12 ENCOUNTER — Other Ambulatory Visit: Payer: Self-pay | Admitting: Hematology and Oncology

## 2017-11-12 NOTE — Telephone Encounter (Signed)
Called and given below message. He will call back and let us know.

## 2017-11-12 NOTE — Telephone Encounter (Signed)
-----   Message from Heath Lark, MD sent at 11/12/2017  8:30 AM EDT ----- Regarding: IV iron pls let him know iron studies are abnormal Will need IV iron again Is he available 3/29 and 4/5? Let me know and I will send scheduling msg ----- Message ----- From: Interface, Lab In Three Zero One Sent: 11/11/2017   1:44 PM To: Heath Lark, MD

## 2017-11-13 ENCOUNTER — Encounter: Payer: Self-pay | Admitting: Internal Medicine

## 2017-11-13 ENCOUNTER — Ambulatory Visit (INDEPENDENT_AMBULATORY_CARE_PROVIDER_SITE_OTHER): Payer: Medicare Other | Admitting: Internal Medicine

## 2017-11-13 VITALS — BP 122/64 | HR 72 | Temp 97.7°F | Ht 72.0 in | Wt 245.0 lb

## 2017-11-13 DIAGNOSIS — N401 Enlarged prostate with lower urinary tract symptoms: Secondary | ICD-10-CM

## 2017-11-13 DIAGNOSIS — G4733 Obstructive sleep apnea (adult) (pediatric): Secondary | ICD-10-CM | POA: Diagnosis not present

## 2017-11-13 DIAGNOSIS — R350 Frequency of micturition: Secondary | ICD-10-CM

## 2017-11-13 DIAGNOSIS — N320 Bladder-neck obstruction: Secondary | ICD-10-CM

## 2017-11-13 DIAGNOSIS — Z96652 Presence of left artificial knee joint: Secondary | ICD-10-CM

## 2017-11-13 DIAGNOSIS — D500 Iron deficiency anemia secondary to blood loss (chronic): Secondary | ICD-10-CM

## 2017-11-13 DIAGNOSIS — E538 Deficiency of other specified B group vitamins: Secondary | ICD-10-CM

## 2017-11-13 MED ORDER — ASPIRIN EC 81 MG PO TBEC: 81.0000 mg | DELAYED_RELEASE_TABLET | Freq: Every day | ORAL | 3 refills | Status: AC

## 2017-11-13 NOTE — Assessment & Plan Note (Signed)
PSA Proscar, Flomax

## 2017-11-13 NOTE — Progress Notes (Signed)
Subjective:  Patient ID: [name], male    DOB: [birth year]  Age: 77 y.o. MRN: [id number]  CC: No chief complaint on file.   HPI [name] presents for HTN, anemia. S/p recent L TKR 09/25/17. C/o nocturia...  Outpatient Medications Prior to Visit  Medication Sig Dispense Refill  . aspirin EC 325 MG tablet Take 1 tablet (325 mg total) by mouth daily. 60 tablet 0  . baclofen (LIORESAL) 10 MG tablet Take 1 tablet (10 mg total) by mouth daily. 50 tablet 1  . Cholecalciferol (VITAMIN D3) 2000 units TABS Take 2,000 Units by mouth at bedtime.    . Coenzyme Q10 (CO Q-10 PO) Take 1 tablet by mouth at bedtime.    . Cyanocobalamin (VITAMIN B-12) 1000 MCG SUBL Place 1 tablet (1,000 mcg total) under the tongue daily. (Patient taking differently: Place 1,000 mcg under the tongue at bedtime. ) 100 tablet 3  . DULoxetine (CYMBALTA) 60 MG capsule TAKE 1 CAPSULE BY MOUTH  DAILY 90 capsule 1  . finasteride (PROSCAR) 5 MG tablet TAKE 1 TABLET BY MOUTH  DAILY (Patient taking differently: TAKE 1 TABLET (5 MG) BY MOUTH  DAILY AT NIGHT) 90 tablet 3  . folic acid (V-R FOLIC ACID) 409 MCG tablet Take 1 tablet (400 mcg total) by mouth daily. (Patient taking differently: Take 400 mcg by mouth at bedtime. ) 90 tablet 3  . furosemide (LASIX) 20 MG tablet TAKE 1 TO 2 TABLETS BY  MOUTH DAILY AS NEEDED FOR  EDEMA (Patient taking differently: TAKE 1 TABLET (20 MG) BY  MOUTH DAILY IN THE EVENING) 180 tablet 3  . Polyethyl Glycol-Propyl Glycol (LUBRICANT EYE DROPS) 0.4-0.3 % SOLN Place 1-2 drops into both eyes daily.    . pravastatin (PRAVACHOL) 20 MG tablet Take 1 tablet (20 mg total) by mouth daily. (Patient taking differently: Take 20 mg by mouth at bedtime. ) 90 tablet 3  . ranitidine (ZANTAC) 150 MG tablet Take 1 tablet (150 mg total) by mouth 2 (two) times daily. (Patient taking differently: Take 150 mg by mouth at bedtime. ) 180 tablet 3  . ranitidine (ZANTAC) 150 MG tablet TAKE 1 TABLET BY MOUTH AT   BEDTIME 90 tablet 3  . tamsulosin (FLOMAX) 0.4 MG CAPS capsule TAKE 2 CAPSULES BY MOUTH  DAILY 180 capsule 3  . traMADol (ULTRAM) 50 MG tablet Take 1 tablet (50 mg total) by mouth every 6 (six) hours as needed. 40 tablet 0  . vitamin C (ASCORBIC ACID) 500 MG tablet Take 1 tablet (500 mg total) by mouth daily. (Patient taking differently: Take 500 mg by mouth at bedtime. ) 100 tablet 1   No facility-administered medications prior to visit.     ROS Review of Systems  Constitutional: Positive for fatigue. Negative for appetite change and unexpected weight change.  HENT: Negative for congestion, nosebleeds, sneezing, sore throat and trouble swallowing.   Eyes: Negative for itching and visual disturbance.  Respiratory: Negative for cough.   Cardiovascular: Negative for chest pain, palpitations and leg swelling.  Gastrointestinal: Negative for abdominal distention, blood in stool, diarrhea and nausea.  Genitourinary: Positive for frequency and urgency. Negative for hematuria.  Musculoskeletal: Positive for arthralgias and gait problem. Negative for back pain, joint swelling and neck pain.  Skin: Negative for rash.  Neurological: Negative for dizziness, tremors, speech difficulty and weakness.  Psychiatric/Behavioral: Negative for agitation, dysphoric mood and sleep disturbance. The patient is not nervous/anxious.     Objective:  BP 122/64 (BP Location:  Right Arm, Patient Position: Sitting, Cuff Size: Large)   Pulse 72   Temp 97.7 F (36.5 C) (Oral)   Ht 6' (1.829 m)   Wt 245 lb (111.1 kg)   SpO2 95%   BMI 33.23 kg/m   BP Readings from Last 3 Encounters:  11/13/17 122/64  10/19/17 (!) 121/57  10/12/17 (!) 109/53    Wt Readings from Last 3 Encounters:  11/13/17 245 lb (111.1 kg)  10/09/17 245 lb 14.4 oz (111.5 kg)  09/25/17 251 lb (113.9 kg)    Physical Exam  Constitutional: He is oriented to person, place, and time. He appears well-developed. No distress.  NAD  HENT:    Mouth/Throat: Oropharynx is clear and moist.  Eyes: Pupils are equal, round, and reactive to light. Conjunctivae are normal.  Neck: Normal range of motion. No JVD present. No thyromegaly present.  Cardiovascular: Normal rate, regular rhythm, normal heart sounds and intact distal pulses. Exam reveals no gallop and no friction rub.  No murmur heard. Pulmonary/Chest: Effort normal and breath sounds normal. No respiratory distress. He has no wheezes. He has no rales. He exhibits no tenderness.  Abdominal: Soft. Bowel sounds are normal. He exhibits no distension and no mass. There is no tenderness. There is no rebound and no guarding.  Musculoskeletal: Normal range of motion. He exhibits tenderness. He exhibits no edema.  Lymphadenopathy:    He has no cervical adenopathy.  Neurological: He is alert and oriented to person, place, and time. He has normal reflexes. No cranial nerve deficit. He exhibits normal muscle tone. He displays a negative Romberg sign. Coordination and gait normal.  Skin: Skin is warm and dry. No rash noted.  Psychiatric: He has a normal mood and affect. His behavior is normal. Judgment and thought content normal.  L knee painful w/ROM  Lab Results  Component Value Date   WBC 4.3 11/11/2017   HGB 10.8 (L) 11/11/2017   HCT 32.2 (L) 11/11/2017   PLT 255.0 11/11/2017   GLUCOSE 102 (H) 11/11/2017   CHOL 139 02/22/2016   TRIG 74.0 02/22/2016   HDL 51.70 02/22/2016   LDLDIRECT 157.5 07/05/2008   LDLCALC 73 02/22/2016   ALT 14 08/13/2015   AST 17 08/13/2015   NA 142 11/11/2017   K 4.3 11/11/2017   CL 103 11/11/2017   CREATININE 0.82 11/11/2017   BUN 13 11/11/2017   CO2 27 11/11/2017   TSH 0.831 08/13/2015   PSA 0.49 03/01/2012   INR 0.99 09/18/2017    No results found.  Assessment & Plan:   There are no diagnoses linked to this encounter. I am having Lee Swopes. Rodgers maintain his Vitamin R-48, folic acid, vitamin C, ranitidine, pravastatin, furosemide,  finasteride, Coenzyme Q10 (CO Q-10 PO), Vitamin D3, Polyethyl Glycol-Propyl Glycol, baclofen, aspirin EC, traMADol, DULoxetine, ranitidine, and tamsulosin.  No orders of the defined types were placed in this encounter.    Follow-up: No follow-ups on file.  [name], MD

## 2017-11-13 NOTE — Assessment & Plan Note (Signed)
.   S/p recent L TKR 09/25/17.

## 2017-11-13 NOTE — Assessment & Plan Note (Signed)
On B12 

## 2017-11-13 NOTE — Assessment & Plan Note (Signed)
Reduce ASA to 1 baby asa a day

## 2017-11-20 ENCOUNTER — Ambulatory Visit (HOSPITAL_COMMUNITY)
Admission: RE | Admit: 2017-11-20 | Discharge: 2017-11-20 | Disposition: A | Discharge status: Home / Self Care | Payer: Medicare Other | Source: Ambulatory Visit | Attending: Hematology and Oncology | Admitting: Hematology and Oncology

## 2017-11-20 DIAGNOSIS — D509 Iron deficiency anemia, unspecified: Secondary | ICD-10-CM | POA: Diagnosis not present

## 2017-11-20 MED ORDER — FERUMOXYTOL INJECTION 510 MG/17 ML
510.0000 mg | Freq: Once | INTRAVENOUS | Status: AC
  Administered 2017-11-20: 510 mg via INTRAVENOUS
  Filled 2017-11-20: qty 17

## 2017-11-20 NOTE — Progress Notes (Signed)
PATIENT CARE CENTER NOTE  Diagnosis: Anemia, iron deficiency [D50.9]   Provider: Dr. Alvy Bimler   Procedure: IV Feraheme   Note: Patient received infusion of IV Feraheme. Patient tolerated infusion well with no adverse reaction. Patient monitored for 30 minutes after infusion and vital signs taken. Discharge instructions given to patient. Patient alert, oriented and ambulatory at discharge.

## 2017-11-20 NOTE — Discharge Instructions (Signed)

## 2017-11-25 ENCOUNTER — Ambulatory Visit: Payer: Self-pay | Admitting: *Deleted

## 2017-11-25 NOTE — Telephone Encounter (Signed)
Patient had an iron infusion on Fri April 1st (not his first, he has had several) since then his tongue feels like it is too long for him mouth. Wife stated his speech sounds different at times, like a lisp. Reported these symptoms have been intermittent since the iron transfusion.  He denies any difficultly swallowing/breathing. Patient will call oncologist at this time.  PCP is Dr. Alain Marion. Appointment made for tomorrow.  Reason for Disposition . All other adults with swollen tongue   (Exception: tongue swelling is a recurrent problem AND NO swelling at present)  Answer Assessment - Initial Assessment Questions 1. ONSET: "When did the swelling start?" (e.g., minutes, hours, days)     Friday night after an [facility] transfusion [facility].  2. LOCATION: "What part of the tongue is swollen?"     Feels like his tongue is too long/big for his mouth.  3. SEVERITY: "How swollen is it?"   Unable to tell. His wife says his speech is different, like a lisp. 4. CAUSE: "What do you think is causing the tongue swelling?" (e.g., hx of angioedema, allergies)     Unsure. It started right after an iron transfusion at the  5. RECURRENT SYMPTOM: "Have you had tongue swelling before?" If so, ask: "When was the last time?" "What happened that time?"    no 6. OTHER SYMPTOMS: "Do you have any other symptoms?" (e.g., difficulty breathing, facial swelling)    No difficulty breathing but the top lip appears slightly bigger than normal. 7. PREGNANCY: "Is there any chance you are pregnant?" "When was your last menstrual period?"  na  Protocols used: TONGUE SWELLING-A-AH

## 2017-11-26 ENCOUNTER — Ambulatory Visit (INDEPENDENT_AMBULATORY_CARE_PROVIDER_SITE_OTHER): Payer: Medicare Other | Admitting: Family Medicine

## 2017-11-26 ENCOUNTER — Other Ambulatory Visit (INDEPENDENT_AMBULATORY_CARE_PROVIDER_SITE_OTHER): Payer: Medicare Other

## 2017-11-26 VITALS — BP 121/78 | HR 62 | Temp 98.6°F | Ht 72.0 in | Wt 238.0 lb

## 2017-11-26 DIAGNOSIS — T783XXA Angioneurotic edema, initial encounter: Secondary | ICD-10-CM

## 2017-11-26 LAB — CBC WITH DIFFERENTIAL/PLATELET
Basophils Absolute: 0 10*3/uL (ref 0.0–0.1)
Basophils Relative: 0.9 % (ref 0.0–3.0)
Eosinophils Absolute: 0.2 10*3/uL (ref 0.0–0.7)
Eosinophils Relative: 3.3 % (ref 0.0–5.0)
HCT: 36.5 % — ABNORMAL LOW (ref 39.0–52.0)
Hemoglobin: 12 g/dL — ABNORMAL LOW (ref 13.0–17.0)
Lymphocytes Relative: 24 % (ref 12.0–46.0)
Lymphs Abs: 1.3 10*3/uL (ref 0.7–4.0)
MCHC: 32.9 g/dL (ref 30.0–36.0)
MCV: 88.3 fl (ref 78.0–100.0)
Monocytes Absolute: 0.7 10*3/uL (ref 0.1–1.0)
Monocytes Relative: 13.5 % — ABNORMAL HIGH (ref 3.0–12.0)
Neutro Abs: 3.1 10*3/uL (ref 1.4–7.7)
Neutrophils Relative %: 58.3 % (ref 43.0–77.0)
Platelets: 285 10*3/uL (ref 150.0–400.0)
RBC: 4.14 Mil/uL — ABNORMAL LOW (ref 4.22–5.81)
RDW: 19.5 % — ABNORMAL HIGH (ref 11.5–15.5)
WBC: 5.4 10*3/uL (ref 4.0–10.5)

## 2017-11-26 LAB — COMPREHENSIVE METABOLIC PANEL
ALT: 12 U/L (ref 0–53)
AST: 16 U/L (ref 0–37)
Albumin: 4 g/dL (ref 3.5–5.2)
Alkaline Phosphatase: 74 U/L (ref 39–117)
BUN: 13 mg/dL (ref 6–23)
CO2: 30 mEq/L (ref 19–32)
Calcium: 9.6 mg/dL (ref 8.4–10.5)
Chloride: 102 mEq/L (ref 96–112)
Creatinine, Ser: 0.74 mg/dL (ref 0.40–1.50)
GFR: 109.09 mL/min (ref >60.00)
Glucose, Bld: 104 mg/dL — ABNORMAL HIGH (ref 70–99)
Potassium: 4.5 mEq/L (ref 3.5–5.1)
Sodium: 138 mEq/L (ref 135–145)
Total Bilirubin: 0.5 mg/dL (ref 0.2–1.2)
Total Protein: 7 g/dL (ref 6.0–8.3)

## 2017-11-26 LAB — C-REACTIVE PROTEIN: CRP: 0.1 mg/dL — ABNORMAL LOW (ref 0.5–20.0)

## 2017-11-26 LAB — SEDIMENTATION RATE: Sed Rate: 16 mm/hr (ref 0–20)

## 2017-11-26 MED ORDER — PREDNISONE 20 MG PO TABS: 40.0000 mg | ORAL_TABLET | Freq: Every day | ORAL | 0 refills | Status: DC

## 2017-11-26 NOTE — Patient Instructions (Signed)
We will call you with the results from today  Start the prednisone today  You may also need to try benadryl if your symptoms are not improving  Please ask your hematologist if you should go ahead with the iron infusion tomorrow.

## 2017-11-26 NOTE — Assessment & Plan Note (Signed)
Having the sensation that his tongue is swelling. Denies any trouble swallowing or breathing. No prior episodes of similar symptoms. No numbness, weakness or trouble speaking to suspect stroke.  - CMP, CBC w/ diff, ESR, CRP, C4 - prednisone  - may need to take benadryl as well  - advised to speak with hematologist if he needs to be pre-medicated for his infusion tomorrow.

## 2017-11-26 NOTE — Progress Notes (Signed)
[name] - 77 y.o. male MRN [id number]  Date of birth: Sep 05, 1940  SUBJECTIVE:  Including CC & ROS.  Chief Complaint  Patient presents with  . Oral Swelling    [name] is a 77 y.o. male that is presenting with tongue swelling. Ongoing for one week. He had an iron fusion done on 11/20/17 for anemia, noticed some swelling that evening. He has upper and lower plates in his mouth.he had an adjustment done at the dentist on 11/23/16.   He has been having trouble eating, he can tolerate liquids. Denies fever or headaches. Denies shortness of breath. No facial asymmetry, weakness or change in speech. Denies any prior episodes. Has been receiving IV iron infusions for roughly two years for anemia. Denies any wheezing or trouble breathing.      Review of Systems  Constitutional: Negative for fever.  HENT: Negative for congestion, facial swelling and trouble swallowing.   Respiratory: Negative for cough and wheezing.   Cardiovascular: Negative for chest pain.  Musculoskeletal: Negative for gait problem.  Neurological: Negative for facial asymmetry, weakness and numbness.    HISTORY: Past Medical, Surgical, Social, and Family History Reviewed & Updated per EMR.   Pertinent Historical Findings include:  Past Medical History:  Diagnosis Date  . Anemia   . Blood transfusion without reported diagnosis 02/23/2014   3 units for iron deficiency anemia. as a baby - whenhe had pneumonia  . BPH (benign prostatic hyperplasia)   . CTS (carpal tunnel syndrome)    better  . Dyspnea   . GERD (gastroesophageal reflux disease)   . Hyperlipidemia   . Hypertension    "THEY DIAGNOSED ME YEARS AGO BUT LATELY IVE ACTUALLY HAD LOW BLOOD PRESSURES "   . Insomnia   . LBP (low back pain)   . Osteoarthritis   . Pneumonia    as a baby  . Sleep apnea    no cpap    Past Surgical History:  Procedure Laterality Date  . CARDIAC CATHETERIZATION N/A 08/15/2015   Procedure: Left Heart Cath and  Coronary Angiography;  Surgeon: Lorretta Harp, MD;  Location: [facility];  Service: Cardiovascular;  Laterality: N/A;  . CARPAL TUNNEL RELEASE Bilateral 1996, 2004   Dr Lenoard Aden thumb surgery.  more than once  . COLONOSCOPY  2005, 2012   diverticulosis.   Marland Kitchen ESOPHAGOGASTRODUODENOSCOPY N/A 02/23/2014   Procedure: ESOPHAGOGASTRODUODENOSCOPY (EGD);  Surgeon: Irene Shipper, MD;  [facility]: [facility];  Service: Endoscopy;  Laterality: N/A;  . EYE SURGERY  2012   both cataracts, Lasik  . LEFT KNEE ARTHROSCOPY Left 07/16/2017    [facility]   . LUMBAR FUSION     x 3  . REVERSE SHOULDER ARTHROPLASTY Left 07/04/2016   Procedure: LEFT REVERSE SHOULDER ARTHROPLASTY;  Surgeon: Netta Cedars, MD;  Location: [facility];  Service: Orthopedics;  Laterality: Left;  . TONSILLECTOMY  1946  . TOTAL KNEE ARTHROPLASTY  2003   Right  . TOTAL KNEE ARTHROPLASTY Left 09/25/2017   Procedure: LEFT TOTAL KNEE ARTHROPLASTY;  Surgeon: Sydnee Cabal, MD;  Location: [facility];  Service: Orthopedics;  Laterality: Left;    No Known Allergies  Family History  Problem Relation Age of Onset  . Aneurysm Father        AAA  . Heart disease Father 7       CHF  . Alzheimer's disease Father   . Cancer Brother   . Alzheimer's disease Brother        dementia  .  Stomach cancer Brother   . Cancer Mother 33       breast cancer  . Heart disease Mother 7       MI  . Autoimmune disease Son   . CAD Brother   . Colon cancer Neg Hx   . Esophageal cancer Neg Hx   . Pancreatic cancer Neg Hx   . Prostate cancer Neg Hx   . Rectal cancer Neg Hx      Social History   Socioeconomic History  . Marital status: Married    Spouse name: Not on file  . Number of children: Not on file  . Years of education: Not on file  . Highest education level: Not on file  Occupational History  . Occupation: Retired    Fish farm manager: RETIRED    CommentDatabase administrator  Social Needs  . Financial resource strain: Not on file  . Food insecurity:     Worry: Not on file    Inability: Not on file  . Transportation needs:    Medical: Not on file    Non-medical: Not on file  Tobacco Use  . Smoking status: Former Smoker    Last attempt to quit: 01/08/1987    Years since quitting: 30.9  . Smokeless tobacco: Never Used  Substance and Sexual Activity  . Alcohol use: Yes    Alcohol/week: 0.0 oz    Comment: rare  . Drug use: No  . Sexual activity: Not Currently  Lifestyle  . Physical activity:    Days per week: Not on file    Minutes per session: Not on file  . Stress: Not on file  Relationships  . Social connections:    Talks on phone: Not on file    Gets together: Not on file    Attends religious service: Not on file    Active member of club or organization: Not on file    Attends meetings of clubs or organizations: Not on file    Relationship status: Not on file  . Intimate partner violence:    Fear of current or ex partner: Not on file    Emotionally abused: Not on file    Physically abused: Not on file    Forced sexual activity: Not on file  Other Topics Concern  . Not on file  Social History Narrative   He lives with wife in a 2 story home.  Has 2 children.   Retired Dealer.   Highest level of education:  High school   Regular Exercise -  YES     PHYSICAL EXAM:  VS: BP 121/78 (BP Location: Left Arm, Patient Position: Sitting, Cuff Size: Normal)   Pulse 62   Temp 98.6 F (37 C) (Oral)   Ht 6' (1.829 m)   Wt 238 lb (108 kg)   SpO2 98%   BMI 32.28 kg/m  Physical Exam Gen: NAD, alert, cooperative with exam,  ENT: mild swelling of the lower lip, normal nasal mucosa, no oral lesions, normal appearing tongue, normal tongue movement.  Eye: normal EOM, normal conjunctiva and lids CV:  no edema, +2 pedal pulses   Resp: no accessory muscle use, non-labored,  Skin: no rashes, no areas of induration  Neuro: normal tone, normal sensation to touch, CN 2-12 Psych:  normal insight, alert and oriented MSK: normal gait,  normal strength  Normal strength in the UE and LE b/l  Normal grip strength b/l  Normal gait  Normal plantar and dorsiflexion  Neurovascularly intact.  ASSESSMENT & PLAN:   Angio-edema Having the sensation that his tongue is swelling. Denies any trouble swallowing or breathing. No prior episodes of similar symptoms. No numbness, weakness or trouble speaking to suspect stroke.  - CMP, CBC w/ diff, ESR, CRP, C4 - prednisone  - may need to take benadryl as well  - advised to speak with hematologist if he needs to be pre-medicated for his infusion tomorrow.

## 2017-11-27 ENCOUNTER — Other Ambulatory Visit: Payer: Self-pay | Admitting: Hematology and Oncology

## 2017-11-27 ENCOUNTER — Encounter (HOSPITAL_COMMUNITY): Payer: Medicare Other

## 2017-11-27 LAB — C3 AND C4
C3 Complement: 155 mg/dL (ref 82–185)
C4 Complement: 28 mg/dL (ref 15–53)

## 2017-11-30 ENCOUNTER — Ambulatory Visit: Payer: Self-pay | Admitting: *Deleted

## 2017-11-30 ENCOUNTER — Emergency Department (HOSPITAL_COMMUNITY): Payer: Medicare Other

## 2017-11-30 ENCOUNTER — Encounter (HOSPITAL_COMMUNITY): Payer: Self-pay

## 2017-11-30 ENCOUNTER — Other Ambulatory Visit: Payer: Self-pay

## 2017-11-30 ENCOUNTER — Inpatient Hospital Stay (HOSPITAL_COMMUNITY)
Admission: EM | Admit: 2017-11-30 | Discharge: 2017-12-05 | DRG: 093 | Disposition: A | Discharge status: Home / Self Care | Payer: Medicare Other | Attending: Internal Medicine | Admitting: Internal Medicine

## 2017-11-30 DIAGNOSIS — F329 Major depressive disorder, single episode, unspecified: Secondary | ICD-10-CM | POA: Diagnosis present

## 2017-11-30 DIAGNOSIS — M4690 Unspecified inflammatory spondylopathy, site unspecified: Secondary | ICD-10-CM | POA: Diagnosis not present

## 2017-11-30 DIAGNOSIS — M47812 Spondylosis without myelopathy or radiculopathy, cervical region: Secondary | ICD-10-CM | POA: Diagnosis present

## 2017-11-30 DIAGNOSIS — R270 Ataxia, unspecified: Secondary | ICD-10-CM | POA: Diagnosis not present

## 2017-11-30 DIAGNOSIS — N400 Enlarged prostate without lower urinary tract symptoms: Secondary | ICD-10-CM | POA: Diagnosis not present

## 2017-11-30 DIAGNOSIS — K228 Other specified diseases of esophagus: Secondary | ICD-10-CM | POA: Diagnosis not present

## 2017-11-30 DIAGNOSIS — G4700 Insomnia, unspecified: Secondary | ICD-10-CM | POA: Diagnosis present

## 2017-11-30 DIAGNOSIS — Z96653 Presence of artificial knee joint, bilateral: Secondary | ICD-10-CM | POA: Diagnosis not present

## 2017-11-30 DIAGNOSIS — K219 Gastro-esophageal reflux disease without esophagitis: Secondary | ICD-10-CM | POA: Diagnosis present

## 2017-11-30 DIAGNOSIS — F419 Anxiety disorder, unspecified: Secondary | ICD-10-CM | POA: Diagnosis not present

## 2017-11-30 DIAGNOSIS — K224 Dyskinesia of esophagus: Secondary | ICD-10-CM | POA: Diagnosis not present

## 2017-11-30 DIAGNOSIS — M4313 Spondylolisthesis, cervicothoracic region: Secondary | ICD-10-CM | POA: Diagnosis not present

## 2017-11-30 DIAGNOSIS — G3184 Mild cognitive impairment, so stated: Secondary | ICD-10-CM | POA: Diagnosis not present

## 2017-11-30 DIAGNOSIS — D500 Iron deficiency anemia secondary to blood loss (chronic): Secondary | ICD-10-CM | POA: Diagnosis not present

## 2017-11-30 DIAGNOSIS — I6523 Occlusion and stenosis of bilateral carotid arteries: Secondary | ICD-10-CM | POA: Diagnosis not present

## 2017-11-30 DIAGNOSIS — R1310 Dysphagia, unspecified: Secondary | ICD-10-CM | POA: Diagnosis not present

## 2017-11-30 DIAGNOSIS — Z7982 Long term (current) use of aspirin: Secondary | ICD-10-CM

## 2017-11-30 DIAGNOSIS — Z87891 Personal history of nicotine dependence: Secondary | ICD-10-CM

## 2017-11-30 DIAGNOSIS — Z96612 Presence of left artificial shoulder joint: Secondary | ICD-10-CM | POA: Diagnosis present

## 2017-11-30 DIAGNOSIS — E669 Obesity, unspecified: Secondary | ICD-10-CM | POA: Diagnosis not present

## 2017-11-30 DIAGNOSIS — I10 Essential (primary) hypertension: Secondary | ICD-10-CM | POA: Diagnosis not present

## 2017-11-30 DIAGNOSIS — Z6832 Body mass index (BMI) 32.0-32.9, adult: Secondary | ICD-10-CM

## 2017-11-30 DIAGNOSIS — R7989 Other specified abnormal findings of blood chemistry: Secondary | ICD-10-CM | POA: Diagnosis not present

## 2017-11-30 DIAGNOSIS — E119 Type 2 diabetes mellitus without complications: Secondary | ICD-10-CM | POA: Diagnosis not present

## 2017-11-30 DIAGNOSIS — R471 Dysarthria and anarthria: Principal | ICD-10-CM | POA: Diagnosis present

## 2017-11-30 DIAGNOSIS — M1990 Unspecified osteoarthritis, unspecified site: Secondary | ICD-10-CM | POA: Diagnosis present

## 2017-11-30 DIAGNOSIS — I2510 Atherosclerotic heart disease of native coronary artery without angina pectoris: Secondary | ICD-10-CM | POA: Diagnosis present

## 2017-11-30 DIAGNOSIS — I639 Cerebral infarction, unspecified: Secondary | ICD-10-CM | POA: Diagnosis not present

## 2017-11-30 DIAGNOSIS — G4730 Sleep apnea, unspecified: Secondary | ICD-10-CM | POA: Diagnosis not present

## 2017-11-30 DIAGNOSIS — Z79899 Other long term (current) drug therapy: Secondary | ICD-10-CM

## 2017-11-30 DIAGNOSIS — R9082 White matter disease, unspecified: Secondary | ICD-10-CM | POA: Diagnosis not present

## 2017-11-30 DIAGNOSIS — E1169 Type 2 diabetes mellitus with other specified complication: Secondary | ICD-10-CM | POA: Diagnosis not present

## 2017-11-30 DIAGNOSIS — R479 Unspecified speech disturbances: Secondary | ICD-10-CM | POA: Diagnosis not present

## 2017-11-30 DIAGNOSIS — Z8249 Family history of ischemic heart disease and other diseases of the circulatory system: Secondary | ICD-10-CM

## 2017-11-30 DIAGNOSIS — I700 Atherosclerosis of aorta: Secondary | ICD-10-CM | POA: Diagnosis not present

## 2017-11-30 DIAGNOSIS — M47894 Other spondylosis, thoracic region: Secondary | ICD-10-CM | POA: Diagnosis not present

## 2017-11-30 DIAGNOSIS — E785 Hyperlipidemia, unspecified: Secondary | ICD-10-CM | POA: Diagnosis present

## 2017-11-30 DIAGNOSIS — Z981 Arthrodesis status: Secondary | ICD-10-CM

## 2017-11-30 DIAGNOSIS — Z7952 Long term (current) use of systemic steroids: Secondary | ICD-10-CM

## 2017-11-30 DIAGNOSIS — I6782 Cerebral ischemia: Secondary | ICD-10-CM | POA: Diagnosis not present

## 2017-11-30 DIAGNOSIS — J9811 Atelectasis: Secondary | ICD-10-CM | POA: Diagnosis not present

## 2017-11-30 LAB — I-STAT CHEM 8, ED
BUN: 17 mg/dL (ref 6–20)
Calcium, Ion: 1.2 mmol/L (ref 1.15–1.40)
Chloride: 99 mmol/L — ABNORMAL LOW (ref 101–111)
Creatinine, Ser: 0.8 mg/dL (ref 0.61–1.24)
Glucose, Bld: 90 mg/dL (ref 65–99)
HCT: 38 % — ABNORMAL LOW (ref 39.0–52.0)
Hemoglobin: 12.9 g/dL — ABNORMAL LOW (ref 13.0–17.0)
Potassium: 3.6 mmol/L (ref 3.5–5.1)
Sodium: 140 mmol/L (ref 135–145)
TCO2: 31 mmol/L (ref 22–32)

## 2017-11-30 LAB — RAPID URINE DRUG SCREEN, HOSP PERFORMED
Amphetamines: NOT DETECTED
Barbiturates: NOT DETECTED
Benzodiazepines: NOT DETECTED
Cocaine: NOT DETECTED
Opiates: NOT DETECTED
Tetrahydrocannabinol: NOT DETECTED

## 2017-11-30 LAB — COMPREHENSIVE METABOLIC PANEL
ALT: 22 U/L (ref 17–63)
AST: 22 U/L (ref 15–41)
Albumin: 3.9 g/dL (ref 3.5–5.0)
Alkaline Phosphatase: 66 U/L (ref 38–126)
Anion gap: 11 (ref 5–15)
BUN: 17 mg/dL (ref 6–20)
CO2: 29 mmol/L (ref 22–32)
Calcium: 9.4 mg/dL (ref 8.9–10.3)
Chloride: 99 mmol/L — ABNORMAL LOW (ref 101–111)
Creatinine, Ser: 0.84 mg/dL (ref 0.61–1.24)
GFR calc Af Amer: >60 mL/min (ref >60)
GFR calc non Af Amer: >60 mL/min (ref >60)
Glucose, Bld: 93 mg/dL (ref 65–99)
Potassium: 3.6 mmol/L (ref 3.5–5.1)
Sodium: 139 mmol/L (ref 135–145)
Total Bilirubin: 0.6 mg/dL (ref 0.3–1.2)
Total Protein: 6.7 g/dL (ref 6.5–8.1)

## 2017-11-30 LAB — CBC
HCT: 39.2 % (ref 39.0–52.0)
Hemoglobin: 12 g/dL — ABNORMAL LOW (ref 13.0–17.0)
MCH: 28.3 pg (ref 26.0–34.0)
MCHC: 30.6 g/dL (ref 30.0–36.0)
MCV: 92.5 fL (ref 78.0–100.0)
Platelets: 276 10*3/uL (ref 150–400)
RBC: 4.24 MIL/uL (ref 4.22–5.81)
RDW: 17.5 % — ABNORMAL HIGH (ref 11.5–15.5)
WBC: 6.8 10*3/uL (ref 4.0–10.5)

## 2017-11-30 LAB — URINALYSIS, ROUTINE W REFLEX MICROSCOPIC
Bilirubin Urine: NEGATIVE
Glucose, UA: NEGATIVE mg/dL
Hgb urine dipstick: NEGATIVE
Ketones, ur: NEGATIVE mg/dL
Leukocytes, UA: NEGATIVE
Nitrite: NEGATIVE
Protein, ur: NEGATIVE mg/dL
Specific Gravity, Urine: 1.011 (ref 1.005–1.030)
pH: 6 (ref 5.0–8.0)

## 2017-11-30 LAB — PROTIME-INR
INR: 0.99
Prothrombin Time: 13 seconds (ref 11.4–15.2)

## 2017-11-30 LAB — APTT: aPTT: 35 seconds (ref 24–36)

## 2017-11-30 LAB — DIFFERENTIAL
Basophils Absolute: 0 10*3/uL (ref 0.0–0.1)
Basophils Relative: 0 %
Eosinophils Absolute: 0.1 10*3/uL (ref 0.0–0.7)
Eosinophils Relative: 2 %
Lymphocytes Relative: 33 %
Lymphs Abs: 2.2 10*3/uL (ref 0.7–4.0)
Monocytes Absolute: 1.1 10*3/uL — ABNORMAL HIGH (ref 0.1–1.0)
Monocytes Relative: 16 %
Neutro Abs: 3.4 10*3/uL (ref 1.7–7.7)
Neutrophils Relative %: 49 %

## 2017-11-30 LAB — I-STAT TROPONIN, ED: Troponin i, poc: 0 ng/mL (ref 0.00–0.08)

## 2017-11-30 NOTE — ED Provider Notes (Signed)
[facility] DEPT Provider Note   CSN: [id number] Arrival date & time: 11/30/17  1511     History   Chief Complaint Chief Complaint  Patient presents with  . Allergic Reaction    HPI [name] is a 77 y.o. male.  HPI Patient presents with difficulty swallowing and feeling that his tongue is swollen.  Began just over a week ago.  He had an iron infusion earlier the day which is not unusual for the patient.  He is been having that for years and never had reactions to it.  That evening he was playing cards with friends and acutely began to have the feeling that his tongue was full there are in his mouth and difficulty speaking.  Has had some trouble swallowing since.  May have had some swelling.  Has seen his primary care doctor for it and was started on steroids.  No improvement.  Now the last couple days is gotten more swollen and more difficulty speaking no other numbness or weakness.  No headaches.  States he feels that his right cheek may be swollen also. Past Medical History:  Diagnosis Date  . Anemia   . Blood transfusion without reported diagnosis 02/23/2014   3 units for iron deficiency anemia. as a baby - whenhe had pneumonia  . BPH (benign prostatic hyperplasia)   . CTS (carpal tunnel syndrome)    better  . Dyspnea   . GERD (gastroesophageal reflux disease)   . Hyperlipidemia   . Hypertension    "THEY DIAGNOSED ME YEARS AGO BUT LATELY IVE ACTUALLY HAD LOW BLOOD PRESSURES "   . Insomnia   . LBP (low back pain)   . Osteoarthritis   . Pneumonia    as a baby  . Sleep apnea    no cpap    Patient Active Problem List   Diagnosis Date Noted  . Angio-edema 11/26/2017  . S/P knee replacement 09/25/2017  . Knee pain, chronic 08/14/2017  . OSA on CPAP 03/05/2017  . OSA (obstructive sleep apnea) 09/30/2016  . S/P shoulder replacement, left 07/04/2016  . Preop exam for internal medicine 04/16/2016  . Claudication ([facility]) 09/14/2015  .  Pain in the chest   . Abnormal stress test 08/14/2015  . Dyspnea 07/23/2015  . Paresthesia 10/25/2014  . Weakness 04/13/2014  . Heme positive stool 02/22/2014  . Benign prostatic hyperplasia 02/22/2014  . B12 deficiency 02/21/2014  . Anemia, iron deficiency 02/21/2014  . Leg weakness, bilateral 01/24/2014  . Erectile dysfunction 01/24/2014  . Edema 01/24/2014  . URI, acute 08/31/2013  . Cellulitis of arm, right 07/13/2013  . Grief 07/13/2013  . Well adult exam 03/01/2012  . CARPAL TUNNEL SYNDROME 11/04/2010  . BENIGN PROSTATIC HYPERTROPHY, WITH URINARY OBSTRUCTION 11/04/2010  . WRIST PAIN 11/04/2010  . LEG PAIN 07/16/2010  . COUGH 05/20/2010  . TOBACCO USE, QUIT 07/04/2009  . WARTS, VIRAL, UNSPECIFIED 11/06/2008  . INSOMNIA, PERSISTENT 01/14/2008  . Essential hypertension 01/14/2008  . RHINITIS 01/14/2008  . ABDOMINAL PAIN 01/14/2008  . TREMOR 09/07/2007  . Actinic keratosis 08/12/2007  . Neoplasm of uncertain behavior of skin 07/06/2007  . LOW BACK PAIN 07/06/2007  . Dyslipidemia 06/05/2007  . Coronary atherosclerosis 06/05/2007  . Osteoarthritis 06/05/2007    Past Surgical History:  Procedure Laterality Date  . CARDIAC CATHETERIZATION N/A 08/15/2015   Procedure: Left Heart Cath and Coronary Angiography;  Surgeon: Lorretta Harp, MD;  Location: [facility];  Service: Cardiovascular;  Laterality: N/A;  .  CARPAL TUNNEL RELEASE Bilateral 1996, 2004   Dr Lenoard Aden thumb surgery.  more than once  . COLONOSCOPY  2005, 2012   diverticulosis.   Marland Kitchen ESOPHAGOGASTRODUODENOSCOPY N/A 02/23/2014   Procedure: ESOPHAGOGASTRODUODENOSCOPY (EGD);  Surgeon: Irene Shipper, MD;  Location: [facility];  Service: Endoscopy;  Laterality: N/A;  . EYE SURGERY  2012   both cataracts, Lasik  . LEFT KNEE ARTHROSCOPY Left 07/16/2017    [facility]   . LUMBAR FUSION     x 3  . REVERSE SHOULDER ARTHROPLASTY Left 07/04/2016   Procedure: LEFT REVERSE SHOULDER ARTHROPLASTY;  Surgeon: Netta Cedars, MD;  Location: [facility];  Service: Orthopedics;  Laterality: Left;  . TONSILLECTOMY  1946  . TOTAL KNEE ARTHROPLASTY  2003   Right  . TOTAL KNEE ARTHROPLASTY Left 09/25/2017   Procedure: LEFT TOTAL KNEE ARTHROPLASTY;  Surgeon: Sydnee Cabal, MD;  Location: [facility];  Service: Orthopedics;  Laterality: Left;        Home Medications    Prior to Admission medications   Medication Sig Start Date End Date Taking? Authorizing Provider  aspirin EC 81 MG tablet Take 1 tablet (81 mg total) by mouth daily. 11/13/17 11/13/18 Yes [name], [name], MD  Cholecalciferol (VITAMIN D3) 2000 units TABS Take 2,000 Units by mouth at bedtime.   Yes [provider]  Coenzyme Q10 (CO Q-10 PO) Take 1 tablet by mouth at bedtime.   Yes [provider]  Cyanocobalamin (VITAMIN B-12) 1000 MCG SUBL Place 1 tablet (1,000 mcg total) under the tongue daily. Patient taking differently: Place 1,000 mcg under the tongue at bedtime.  02/21/14  Yes [name], [name], MD  DULoxetine (CYMBALTA) 60 MG capsule TAKE 1 CAPSULE BY MOUTH  DAILY 11/09/17  Yes [name], [name], MD  finasteride (PROSCAR) 5 MG tablet TAKE 1 TABLET BY MOUTH  DAILY Patient taking differently: TAKE 1 TABLET (5 MG) BY MOUTH  DAILY AT NIGHT 08/20/17  Yes [name], [name], MD  folic acid (V-R FOLIC ACID) 284 MCG tablet Take 1 tablet (400 mcg total) by mouth daily. Patient taking differently: Take 400 mcg by mouth at bedtime.  09/20/14  Yes [name], [name], MD  furosemide (LASIX) 20 MG tablet TAKE 1 TO 2 TABLETS BY  MOUTH DAILY AS NEEDED FOR  EDEMA Patient taking differently: TAKE 1 TABLET (20 MG) BY  MOUTH DAILY IN THE EVENING 08/20/17  Yes [name], [name], MD  Polyethyl Glycol-Propyl Glycol (LUBRICANT EYE DROPS) 0.4-0.3 % SOLN Place 1-2 drops into both eyes daily.   Yes [provider]  pravastatin (PRAVACHOL) 20 MG tablet Take 1 tablet (20 mg total) by mouth daily. Patient taking differently: Take 20 mg by  mouth at bedtime.  04/15/17  Yes [name], [name], MD  predniSONE (DELTASONE) 20 MG tablet Take 2 tablets (40 mg total) by mouth daily with breakfast. 11/26/17  Yes Rosemarie Ax, MD  ranitidine (ZANTAC) 150 MG tablet Take 1 tablet (150 mg total) by mouth 2 (two) times daily. Patient taking differently: Take 150 mg by mouth at bedtime.  10/14/16  Yes [name], [name], MD  tamsulosin (FLOMAX) 0.4 MG CAPS capsule TAKE 2 CAPSULES BY MOUTH  DAILY 11/09/17  Yes [name], [name], MD  vitamin C (ASCORBIC ACID) 500 MG tablet Take 1 tablet (500 mg total) by mouth daily. Patient taking differently: Take 500 mg by mouth at bedtime.  01/18/16  Yes [name], [name], MD    Family History Family History  Problem Relation Age  of Onset  . Aneurysm Father        AAA  . Heart disease Father 68       CHF  . Alzheimer's disease Father   . Cancer Brother   . Alzheimer's disease Brother        dementia  . Stomach cancer Brother   . Cancer Mother 23       breast cancer  . Heart disease Mother 21       MI  . Autoimmune disease Son   . CAD Brother   . Colon cancer Neg Hx   . Esophageal cancer Neg Hx   . Pancreatic cancer Neg Hx   . Prostate cancer Neg Hx   . Rectal cancer Neg Hx     Social History Social History   Tobacco Use  . Smoking status: Former Smoker    Last attempt to quit: 01/08/1987    Years since quitting: 30.9  . Smokeless tobacco: Never Used  Substance Use Topics  . Alcohol use: Yes    Alcohol/week: 0.0 oz    Comment: rare  . Drug use: No     Allergies   Patient has no known allergies.   Review of Systems Review of Systems  Constitutional: Negative for appetite change.  HENT: Positive for trouble swallowing and voice change.   Eyes: Negative for visual disturbance.  Respiratory: Negative for shortness of breath.   Cardiovascular: Negative for chest pain.  Gastrointestinal: Negative for abdominal pain.  Genitourinary: Negative for genital sores.    Musculoskeletal: Negative for back pain.  Skin: Negative for rash.  Neurological: Positive for speech difficulty. Negative for tremors and weakness.  Psychiatric/Behavioral: Negative for confusion.     Physical Exam Updated Vital Signs BP (!) 158/80   Pulse (!) 59   Temp 97.7 F (36.5 C) (Oral)   Resp 18   Ht 6' (1.829 m)   Wt 108 kg (238 lb)   SpO2 96%   BMI 32.28 kg/m   Physical Exam  Constitutional: He appears well-developed.  HENT:  Head: Normocephalic.  Speech may be slightly slurred.  Tongue not clearly edematous or swollen.  It is on the larger side but unknown compared to baseline.  Questionable mild right cheek swelling.  Eyes: Pupils are equal, round, and reactive to light.  Neck: Neck supple.  Cardiovascular: Normal rate.  Pulmonary/Chest: No respiratory distress.  Abdominal: There is no tenderness.  Musculoskeletal: Normal range of motion. He exhibits no edema.  Neurological: He is alert.  Awake and appropriate.  Slightly slurred speech.  Good grip strength bilaterally.  Face symmetric.  Skin: Skin is warm. Capillary refill takes less than 2 seconds.     ED Treatments / Results  Labs (all labs ordered are listed, but only abnormal results are displayed) Labs Reviewed  CBC - Abnormal; Notable for the following components:      Result Value   Hemoglobin 12.0 (*)    RDW 17.5 (*)    All other components within normal limits  DIFFERENTIAL - Abnormal; Notable for the following components:   Monocytes Absolute 1.1 (*)    All other components within normal limits  COMPREHENSIVE METABOLIC PANEL - Abnormal; Notable for the following components:   Chloride 99 (*)    All other components within normal limits  I-STAT CHEM 8, ED - Abnormal; Notable for the following components:   Chloride 99 (*)    Hemoglobin 12.9 (*)    HCT 38.0 (*)    All other components  within normal limits  PROTIME-INR  APTT  ETHANOL  RAPID URINE DRUG SCREEN, HOSP PERFORMED   URINALYSIS, ROUTINE W REFLEX MICROSCOPIC  I-STAT TROPONIN, ED    EKG EKG Interpretation  Date/Time:  Monday November 30 2017 16:38:18 EDT Ventricular Rate:  60 PR Interval:    QRS Duration: 101 QT Interval:  428 QTC Calculation: 428 R Axis:   58 Text Interpretation:  Sinus rhythm Abnormal R-wave progression, early transition Confirmed by Davonna Belling [phone]) on 11/30/2017 6:50:41 PM   Radiology Ct Head Wo Contrast  Result Date: 11/30/2017 CLINICAL DATA:  Allergic reaction after iron infusion. EXAM: CT HEAD WITHOUT CONTRAST TECHNIQUE: Contiguous axial images were obtained from the base of the skull through the vertex without intravenous contrast. COMPARISON:  MRI of the head December 14, 2014 FINDINGS: BRAIN: No intraparenchymal hemorrhage, mass effect nor midline shift. Patchy to confluent supratentorial white matter hypodensities within normal range for patient's age, though non-specific are most compatible with chronic small vessel ischemic disease. Tiny RIGHT thalamus lacunar infarct versus perivascular space. No acute large vascular territory infarcts. No abnormal extra-axial fluid collections. Basal cisterns are patent. VASCULAR: Mild calcific atherosclerosis of the carotid siphons. SKULL: No skull fracture. No significant scalp soft tissue swelling. LEFT frontal scalp scarring. SINUSES/ORBITS: The mastoid air-cells and included paranasal sinuses are well-aerated.The included ocular globes and orbital contents are non-suspicious. Status post bilateral ocular lens implants. OTHER: None. IMPRESSION: 1. No acute intracranial process. 2. Moderate to severe chronic small vessel ischemic disease. Electronically Signed   By: Elon Alas M.D.   On: 11/30/2017 17:03    Procedures Procedures (including critical care time)  Medications Ordered in ED Medications - No data to display   Initial Impression / Assessment and Plan / ED Course  I have reviewed the triage vital signs and the  nursing notes.  Pertinent labs & imaging results that were available during my care of the patient were reviewed by me and considered in my medical decision making (see chart for details).     Patient with feeling that his tongue is swollen.  Some difficulty swallowing also.  Has had for the last approximately 10 days.  Began the day had an iron infusion but has had previous iron infusions without difficulty.  No other numbness weakness.  Tongue is not clearly swollen but unknown baseline.  Head CT reassuring.  Unsure if this could be neuro or muscular cause.  Will admit to hospitalist for further evaluation and treatment.  Final Clinical Impressions(s) / ED Diagnoses   Final diagnoses:  Dysphagia, unspecified type    ED Discharge Orders    None       Davonna Belling, MD 11/30/17 1851

## 2017-11-30 NOTE — Telephone Encounter (Signed)
Pt's wife [name], called stating that the pt had an iron infusion 1 week ago, and he had something going on his mouth; he was seen at dentist and in MD office on 11/26/17; his swallowing has worsened, and and there is puffiness on the right side of his face; she also states that when he gets excited it is hard for him to get the words out; she would like for the pt be seen with Dr Alain Marion; nurse triage initiated and recommendations made per protocol to include calling PCP; spoke with Gareth Eagle and she will route this to provider for review; the pt and his wife can be contacted at [phone].     Reason for Disposition . [1] Coughing spells AND [2] occur during eating/feedings or within 2 hours  Answer Assessment - Initial Assessment Questions 1. SYMPTOM: "Are you having difficulty swallowing liquids, solids, or both?"     both 2. ONSET: "When did the swallowing problems begin?"     11/21/17; had iron infusion 3. CAUSE: "What do you think is causing the problem?"      unsure 4. CHRONIC/RECURRENT: "Is this a new problem for you?"  If no, ask: "How long have you had this problem?" (e.g., days, weeks, months)     recurrent 5. OTHER SYMPTOMS: "Do you have any other symptoms?" (e.g., difficulty breathing, sore throat, swollen tongue, chest pain)     Intermittent Swollen tongue 6. PREGNANCY: "Is there any chance you are pregnant?" "When was your last menstrual period?"     n/a  Protocols used: SWALLOWING DIFFICULTY-A-AH

## 2017-11-30 NOTE — H&P (Signed)
[facility]                                                                                    Patient Demographics  [name], is a 77 y.o. male  CSN: [id number]  MRN: [id number]  DOB - 06-Sep-1940  Admit Date - 11/30/2017  Outpatient Primary MD for the patient is [name], [name], MD   With History of -  Past Medical History:  Diagnosis Date  . Anemia   . Blood transfusion without reported diagnosis 02/23/2014   3 units for iron deficiency anemia. as a baby - whenhe had pneumonia  . BPH (benign prostatic hyperplasia)   . CTS (carpal tunnel syndrome)    better  . Dyspnea   . GERD (gastroesophageal reflux disease)   . Hyperlipidemia   . Hypertension    "THEY DIAGNOSED ME YEARS AGO BUT LATELY IVE ACTUALLY HAD LOW BLOOD PRESSURES "   . Insomnia   . LBP (low back pain)   . Osteoarthritis   . Pneumonia    as a baby  . Sleep apnea    no cpap      Past Surgical History:  Procedure Laterality Date  . CARDIAC CATHETERIZATION N/A 08/15/2015   Procedure: Left Heart Cath and Coronary Angiography;  Surgeon: Lorretta Harp, MD;  Location: [facility];  Service: Cardiovascular;  Laterality: N/A;  . CARPAL TUNNEL RELEASE Bilateral 1996, 2004   Dr Lenoard Aden thumb surgery.  more than once  . COLONOSCOPY  2005, 2012   diverticulosis.   Marland Kitchen ESOPHAGOGASTRODUODENOSCOPY N/A 02/23/2014   Procedure: ESOPHAGOGASTRODUODENOSCOPY (EGD);  Surgeon: Irene Shipper, MD;  Location: [facility];  Service: Endoscopy;  Laterality: N/A;  . EYE SURGERY  2012   both cataracts, Lasik  . LEFT KNEE ARTHROSCOPY Left 07/16/2017    [facility]   . LUMBAR FUSION     x 3  . REVERSE SHOULDER ARTHROPLASTY Left 07/04/2016   Procedure: LEFT REVERSE SHOULDER ARTHROPLASTY;  Surgeon: Netta Cedars, MD;  Location: [place];  Service: Orthopedics;  Laterality: Left;  . TONSILLECTOMY  1946  . TOTAL KNEE ARTHROPLASTY  2003   Right  . TOTAL KNEE ARTHROPLASTY Left 09/25/2017   Procedure: LEFT  TOTAL KNEE ARTHROPLASTY;  Surgeon: Sydnee Cabal, MD;  Location: [facility];  Service: Orthopedics;  Laterality: Left;    in for   Chief Complaint  Patient presents with  . Allergic Reaction     HPI  [name]  is a 77 y.o. male, with past medical history significant for coronary artery disease, anemia with chronic blood loss and iron infusion who is presenting with one-week history of slurring of speech and dysphagia that has got much worse the last 2 days.  The patient had iron infusion at 10:00 in the morning and his symptoms started at night of that day.  Patient has been receiving these blood transfusions for years without any reactions.  Patient feels that his tongue is swollen and lately has not been able to tolerate p.o. the family was worried especially today when he did not have anything to eat all day.  Patient denies any visual symptoms or headaches.  He was seen by his primary medical doctor who started him on prednisone for possible allergic reaction to iron.  Again symptoms got worse and the patient presented to the emergency room.    Review of Systems    In addition to the HPI above, No Fever-chills, No Headache, No changes with Vision or hearing, No problems swallowing food or Liquids, No Chest pain, Cough or Shortness of Breath, No Abdominal pain, No Nausea or Vommitting, Bowel movements are regular, No Blood in stool or Urine, No dysuria, No new skin rashes or bruises, No new joints pains-aches,  No recent weight gain or loss, No polyuria, polydypsia or polyphagia, No significant Mental Stressors.  A full 10 point Review of Systems was done, except as stated above, all other Review of Systems were negative.   Social History Social History   Tobacco Use  . Smoking status: Former Smoker    Last attempt to quit: 01/08/1987    Years since quitting: 30.9  . Smokeless tobacco: Never Used  Substance Use Topics  . Alcohol use: Yes    Alcohol/week: 0.0 oz     Comment: rare     Family History Family History  Problem Relation Age of Onset  . Aneurysm Father        AAA  . Heart disease Father 29       CHF  . Alzheimer's disease Father   . Cancer Brother   . Alzheimer's disease Brother        dementia  . Stomach cancer Brother   . Cancer Mother 20       breast cancer  . Heart disease Mother 31       MI  . Autoimmune disease Son   . CAD Brother   . Colon cancer Neg Hx   . Esophageal cancer Neg Hx   . Pancreatic cancer Neg Hx   . Prostate cancer Neg Hx   . Rectal cancer Neg Hx      Prior to Admission medications   Medication Sig Start Date End Date Taking? Authorizing Provider  aspirin EC 81 MG tablet Take 1 tablet (81 mg total) by mouth daily. 11/13/17 11/13/18 Yes [name], [name], MD  Cholecalciferol (VITAMIN D3) 2000 units TABS Take 2,000 Units by mouth at bedtime.   Yes [provider]  Coenzyme Q10 (CO Q-10 PO) Take 1 tablet by mouth at bedtime.   Yes [provider]  Cyanocobalamin (VITAMIN B-12) 1000 MCG SUBL Place 1 tablet (1,000 mcg total) under the tongue daily. Patient taking differently: Place 1,000 mcg under the tongue at bedtime.  02/21/14  Yes [name], [name], MD  DULoxetine (CYMBALTA) 60 MG capsule TAKE 1 CAPSULE BY MOUTH  DAILY 11/09/17  Yes [name], [name], MD  finasteride (PROSCAR) 5 MG tablet TAKE 1 TABLET BY MOUTH  DAILY Patient taking differently: TAKE 1 TABLET (5 MG) BY MOUTH  DAILY AT NIGHT 08/20/17  Yes [name], [name], MD  folic acid (V-R FOLIC ACID) 017 MCG tablet Take 1 tablet (400 mcg total) by mouth daily. Patient taking differently: Take 400 mcg by mouth at bedtime.  09/20/14  Yes [name], [name], MD  furosemide (LASIX) 20 MG tablet TAKE 1 TO 2 TABLETS BY  MOUTH DAILY AS NEEDED FOR  EDEMA Patient taking differently: TAKE 1 TABLET (20 MG) BY  MOUTH DAILY IN THE EVENING 08/20/17  Yes [name], [name], MD  Polyethyl Glycol-Propyl Glycol (LUBRICANT EYE DROPS)  0.4-0.3 % SOLN Place 1-2 drops into both  eyes daily.   Yes [provider]  pravastatin (PRAVACHOL) 20 MG tablet Take 1 tablet (20 mg total) by mouth daily. Patient taking differently: Take 20 mg by mouth at bedtime.  04/15/17  Yes [name], [name], MD  predniSONE (DELTASONE) 20 MG tablet Take 2 tablets (40 mg total) by mouth daily with breakfast. 11/26/17  Yes Rosemarie Ax, MD  ranitidine (ZANTAC) 150 MG tablet Take 1 tablet (150 mg total) by mouth 2 (two) times daily. Patient taking differently: Take 150 mg by mouth at bedtime.  10/14/16  Yes [name], [name], MD  tamsulosin (FLOMAX) 0.4 MG CAPS capsule TAKE 2 CAPSULES BY MOUTH  DAILY 11/09/17  Yes [name], [name], MD  vitamin C (ASCORBIC ACID) 500 MG tablet Take 1 tablet (500 mg total) by mouth daily. Patient taking differently: Take 500 mg by mouth at bedtime.  01/18/16  Yes [name], [name], MD    No Known Allergies  Physical Exam  Vitals  Blood pressure 139/79, pulse 63, temperature 97.7 F (36.5 C), temperature source Oral, resp. rate 20, height 6' (1.829 m), weight 108 kg (238 lb), SpO2 94 %.   1. General well-developed, well-nourished male very pleasant in no acute distress  2. Normal affect and insight, Not Suicidal or Homicidal, Awake Alert, Oriented X 3.  3. No F.N deficits, grossly, patient moving all extremities.  Sensations and cranial nerves were normal.  4. Ears and Eyes appear Normal, Conjunctivae clear, PERRLA. Moist Oral Mucosa , did not notice any hyperglycemia.  Jaw muscles are normal with no swelling noted  5. Supple Neck, No JVD, No cervical lymphadenopathy appriciated, No Carotid Bruits.  6. Symmetrical Chest wall movement, Good air movement bilaterally, CTAB.  7. RRR, No Gallops, Rubs or Murmurs, No Parasternal Heave.  8. Positive Bowel Sounds, Abdomen Soft, Non tender, No organomegaly appriciated,No rebound -guarding or rigidity.  9.  No Cyanosis, Normal Skin Turgor, No Skin  Rash or Bruise.  10. Good muscle tone,  joints appear normal , no effusions, Normal ROM.    Data Review  CBC Recent Labs  Lab 11/26/17 1027 11/30/17 1550 11/30/17 1656  WBC 5.4 6.8  --   HGB 12.0* 12.0* 12.9*  HCT 36.5* 39.2 38.0*  PLT 285.0 276  --   MCV 88.3 92.5  --   MCH  --  28.3  --   MCHC 32.9 30.6  --   RDW 19.5* 17.5*  --   LYMPHSABS 1.3 2.2  --   MONOABS 0.7 1.1*  --   EOSABS 0.2 0.1  --   BASOSABS 0.0 0.0  --    ------------------------------------------------------------------------------------------------------------------  Chemistries  Recent Labs  Lab 11/26/17 1027 11/30/17 1550 11/30/17 1656  NA 138 139 140  K 4.5 3.6 3.6  CL 102 99* 99*  CO2 30 29  --   GLUCOSE 104* 93 90  BUN 13 17 17   CREATININE 0.74 0.84 0.80  CALCIUM 9.6 9.4  --   AST 16 22  --   ALT 12 22  --   ALKPHOS 74 66  --   BILITOT 0.5 0.6  --    ------------------------------------------------------------------------------------------------------------------ estimated creatinine clearance is 99.8 mL/min (by C-G formula based on SCr of 0.8 mg/dL). ------------------------------------------------------------------------------------------------------------------ No results for input(s): TSH, T4TOTAL, T3FREE, THYROIDAB in the last 72 hours.  Invalid input(s): FREET3   Coagulation profile Recent Labs  Lab 11/30/17 1550  INR 0.99   ------------------------------------------------------------------------------------------------------------------- No results for input(s): DDIMER in the last 72 hours. -------------------------------------------------------------------------------------------------------------------  Cardiac Enzymes No results for input(s): CKMB, TROPONINI, MYOGLOBIN in the last 168 hours.  Invalid input(s): CK ------------------------------------------------------------------------------------------------------------------ Invalid input(s):  POCBNP   ---------------------------------------------------------------------------------------------------------------  Urinalysis    Component Value Date/Time   COLORURINE YELLOW 11/30/2017 2024   APPEARANCEUR CLEAR 11/30/2017 2024   LABSPEC 1.011 11/30/2017 2024   PHURINE 6.0 11/30/2017 2024   GLUCOSEU NEGATIVE 11/30/2017 2024   GLUCOSEU NEGATIVE 03/01/2012 1029   HGBUR NEGATIVE 11/30/2017 2024   BILIRUBINUR NEGATIVE 11/30/2017 2024   BILIRUBINUR negative 06/11/2016 [place] 11/30/2017 2024   PROTEINUR NEGATIVE 11/30/2017 2024   UROBILINOGEN negative 06/11/2016 1106   UROBILINOGEN 1.0 03/07/2014 0019   NITRITE NEGATIVE 11/30/2017 2024   LEUKOCYTESUR NEGATIVE 11/30/2017 2024    ----------------------------------------------------------------------------------------------------------------   Imaging results:   Dg Chest 2 View  Result Date: 11/30/2017 CLINICAL DATA:  Tongue and facial swelling for several days. Difficulty speaking. EXAM: CHEST - 2 VIEW COMPARISON:  08/14/2015 FINDINGS: The heart size and mediastinal contours are within normal limits. Chronic elevation of left hemidiaphragm is unchanged. Both lungs are clear. Several old left rib fracture deformities are again seen. Left shoulder prosthesis noted. IMPRESSION: Chronic elevation of left hemidiaphragm. No active cardiopulmonary disease. Electronically Signed   By: Earle Gell M.D.   On: 11/30/2017 18:50   Ct Head Wo Contrast  Result Date: 11/30/2017 CLINICAL DATA:  Allergic reaction after iron infusion. EXAM: CT HEAD WITHOUT CONTRAST TECHNIQUE: Contiguous axial images were obtained from the base of the skull through the vertex without intravenous contrast. COMPARISON:  MRI of the head December 14, 2014 FINDINGS: BRAIN: No intraparenchymal hemorrhage, mass effect nor midline shift. Patchy to confluent supratentorial white matter hypodensities within normal range for patient's age, though non-specific are  most compatible with chronic small vessel ischemic disease. Tiny RIGHT thalamus lacunar infarct versus perivascular space. No acute large vascular territory infarcts. No abnormal extra-axial fluid collections. Basal cisterns are patent. VASCULAR: Mild calcific atherosclerosis of the carotid siphons. SKULL: No skull fracture. No significant scalp soft tissue swelling. LEFT frontal scalp scarring. SINUSES/ORBITS: The mastoid air-cells and included paranasal sinuses are well-aerated.The included ocular globes and orbital contents are non-suspicious. Status post bilateral ocular lens implants. OTHER: None. IMPRESSION: 1. No acute intracranial process. 2. Moderate to severe chronic small vessel ischemic disease. Electronically Signed   By: Elon Alas M.D.   On: 11/30/2017 17:03      Assessment & Plan  1.  This gentleman is presenting with a complicated picture, with slurring of speech and progressive dysphagia over the last 7 days.  He was treated for allergic reaction to iron infusion with prednisone, however his symptoms got worse. 2.  History of coronary artery disease 3.  History of anemia with chronic blood loss  Plan  MRI of the brain Consult neurology Neuro checks Needs swallowing evaluation with possible modified barium swallow IV steroids   DVT Prophylaxis Lovenox  AM Labs Ordered, also please review Full Orders  Family Communication: Admission, patients condition and plan of care including tests being ordered have been discussed with the patient and wife who indicate understanding and agree with the plan and Code Status.  Code Status full  Disposition Plan: Home  Time spent in minutes : 52 minutes  Condition GUARDED   @SIGNATURE @

## 2017-11-30 NOTE — ED Triage Notes (Signed)
Patient began having an allergic reaction following an iron infusion. A week ago. patient noticed that his lips were swelling that night. Patient was scheduled for another iron infusion,but was cancelled due to possible reaction to the previous one.. Today, patient has tongue swelling and right facial swelling. Patient has some difficulty speaking and when swallowing states his "tongue is in the way."

## 2017-11-30 NOTE — Telephone Encounter (Signed)
Please advise 

## 2017-11-30 NOTE — Telephone Encounter (Signed)
Patient's wife [name] called back to advise that she tried to give the patient a muffin and coffee and he was not able to swallow it and is asking what does she need to do. I advised that he does not need to eat or drink anything else until she hears from Dr. Alain Marion with his recommendation. I advised that it is safer to not push foods than to have the patient aspirate and choke, she verbalized understanding. I advised I would call back to the office notifying that this is the 2nd call with this same problem. She says "we will be waiting by the phone." Gareth Eagle, [facility] notified that patient's wife called back a second time.

## 2017-11-30 NOTE — ED Notes (Signed)
Stroke swallow screen was clicked off on previous shift but not completed. Will do stroke swallow screen at this time.

## 2017-11-30 NOTE — Telephone Encounter (Signed)
Is it better? Did he finish Prednisone? Eat soup, pureed food Sch OV w/me pls To ER if worse Thx

## 2017-11-30 NOTE — Telephone Encounter (Signed)
This encounter was created in error - please disregard.

## 2017-12-01 ENCOUNTER — Inpatient Hospital Stay (HOSPITAL_COMMUNITY): Payer: Medicare Other

## 2017-12-01 ENCOUNTER — Observation Stay (HOSPITAL_COMMUNITY): Payer: Medicare Other

## 2017-12-01 DIAGNOSIS — E669 Obesity, unspecified: Secondary | ICD-10-CM

## 2017-12-01 DIAGNOSIS — G3184 Mild cognitive impairment, so stated: Secondary | ICD-10-CM | POA: Diagnosis not present

## 2017-12-01 DIAGNOSIS — I10 Essential (primary) hypertension: Secondary | ICD-10-CM

## 2017-12-01 DIAGNOSIS — E1169 Type 2 diabetes mellitus with other specified complication: Secondary | ICD-10-CM

## 2017-12-01 DIAGNOSIS — G4700 Insomnia, unspecified: Secondary | ICD-10-CM | POA: Diagnosis not present

## 2017-12-01 DIAGNOSIS — D500 Iron deficiency anemia secondary to blood loss (chronic): Secondary | ICD-10-CM | POA: Diagnosis not present

## 2017-12-01 DIAGNOSIS — I639 Cerebral infarction, unspecified: Secondary | ICD-10-CM

## 2017-12-01 DIAGNOSIS — E785 Hyperlipidemia, unspecified: Secondary | ICD-10-CM

## 2017-12-01 DIAGNOSIS — R471 Dysarthria and anarthria: Secondary | ICD-10-CM | POA: Diagnosis not present

## 2017-12-01 DIAGNOSIS — R270 Ataxia, unspecified: Secondary | ICD-10-CM | POA: Diagnosis not present

## 2017-12-01 DIAGNOSIS — R1310 Dysphagia, unspecified: Secondary | ICD-10-CM | POA: Diagnosis not present

## 2017-12-01 DIAGNOSIS — E119 Type 2 diabetes mellitus without complications: Secondary | ICD-10-CM

## 2017-12-01 LAB — HEMOGLOBIN A1C
Hgb A1c MFr Bld: 4.6 % — ABNORMAL LOW (ref 4.8–5.6)
Mean Plasma Glucose: 85.32 mg/dL

## 2017-12-01 LAB — LIPID PANEL
Cholesterol: 182 mg/dL (ref 0–200)
HDL: 48 mg/dL (ref >40)
LDL Cholesterol: 99 mg/dL (ref 0–99)
Total CHOL/HDL Ratio: 3.8 RATIO
Triglycerides: 174 mg/dL — ABNORMAL HIGH (ref <150)
VLDL: 35 mg/dL (ref 0–40)

## 2017-12-01 LAB — ECHOCARDIOGRAM COMPLETE
Height: 72 in
Weight: 3808 oz

## 2017-12-01 MED ORDER — TAMSULOSIN HCL 0.4 MG PO CAPS
0.8000 mg | ORAL_CAPSULE | Freq: Every day | ORAL | Status: DC
  Administered 2017-12-01 – 2017-12-05 (×2): 0.8 mg via ORAL
  Filled 2017-12-01 (×4): qty 2

## 2017-12-01 MED ORDER — PANTOPRAZOLE SODIUM 40 MG IV SOLR
40.0000 mg | INTRAVENOUS | Status: DC
  Administered 2017-12-01: 40 mg via INTRAVENOUS
  Filled 2017-12-01: qty 40

## 2017-12-01 MED ORDER — SENNOSIDES-DOCUSATE SODIUM 8.6-50 MG PO TABS: 1.0000 | ORAL_TABLET | Freq: Every evening | ORAL | Status: DC | PRN

## 2017-12-01 MED ORDER — ACETAMINOPHEN 325 MG PO TABS: 650.0000 mg | ORAL_TABLET | ORAL | Status: DC | PRN

## 2017-12-01 MED ORDER — STROKE: EARLY STAGES OF RECOVERY BOOK
Freq: Once | Status: AC
  Administered 2017-12-01: 03:00:00
  Filled 2017-12-01: qty 1

## 2017-12-01 MED ORDER — POLYVINYL ALCOHOL 1.4 % OP SOLN
1.0000 [drp] | Freq: Every day | OPHTHALMIC | Status: DC
  Administered 2017-12-01: 1 [drp] via OPHTHALMIC
  Administered 2017-12-02 – 2017-12-03 (×2): 2 [drp] via OPHTHALMIC
  Filled 2017-12-01: qty 15

## 2017-12-01 MED ORDER — ENOXAPARIN SODIUM 40 MG/0.4ML ~~LOC~~ SOLN: 40.0000 mg | SUBCUTANEOUS | Status: DC

## 2017-12-01 MED ORDER — ENOXAPARIN SODIUM 60 MG/0.6ML ~~LOC~~ SOLN
55.0000 mg | SUBCUTANEOUS | Status: DC
  Administered 2017-12-01 – 2017-12-03 (×2): 55 mg via SUBCUTANEOUS
  Filled 2017-12-01 (×3): qty 0.6

## 2017-12-01 MED ORDER — ASPIRIN EC 81 MG PO TBEC
81.0000 mg | DELAYED_RELEASE_TABLET | Freq: Every day | ORAL | Status: DC
  Administered 2017-12-01 – 2017-12-05 (×2): 81 mg via ORAL
  Filled 2017-12-01 (×4): qty 1

## 2017-12-01 MED ORDER — METHYLPREDNISOLONE SODIUM SUCC 125 MG IJ SOLR
60.0000 mg | Freq: Four times a day (QID) | INTRAMUSCULAR | Status: DC
  Administered 2017-12-01 – 2017-12-05 (×17): 60 mg via INTRAVENOUS
  Filled 2017-12-01 (×17): qty 2

## 2017-12-01 MED ORDER — FINASTERIDE 5 MG PO TABS
5.0000 mg | ORAL_TABLET | Freq: Every day | ORAL | Status: DC
  Administered 2017-12-01 – 2017-12-05 (×2): 5 mg via ORAL
  Filled 2017-12-01 (×4): qty 1

## 2017-12-01 MED ORDER — LORAZEPAM 2 MG/ML IJ SOLN
0.5000 mg | Freq: Once | INTRAMUSCULAR | Status: DC
  Filled 2017-12-01 (×2): qty 1

## 2017-12-01 MED ORDER — ACETAMINOPHEN 160 MG/5ML PO SOLN: 650.0000 mg | ORAL | Status: DC | PRN

## 2017-12-01 MED ORDER — PANTOPRAZOLE SODIUM 40 MG PO TBEC
40.0000 mg | DELAYED_RELEASE_TABLET | Freq: Every day | ORAL | Status: DC
  Administered 2017-12-01: 40 mg via ORAL
  Filled 2017-12-01: qty 1

## 2017-12-01 MED ORDER — GADOBENATE DIMEGLUMINE 529 MG/ML IV SOLN
20.0000 mL | Freq: Once | INTRAVENOUS | Status: AC | PRN
  Administered 2017-12-01: 20 mL via INTRAVENOUS

## 2017-12-01 MED ORDER — DEXTROSE-NACL 5-0.45 % IV SOLN
INTRAVENOUS | Status: DC
  Administered 2017-12-01 – 2017-12-04 (×6): via INTRAVENOUS

## 2017-12-01 MED ORDER — ACETAMINOPHEN 650 MG RE SUPP: 650.0000 mg | RECTAL | Status: DC | PRN

## 2017-12-01 NOTE — Consult Note (Addendum)
Referring Physician: Kayleen Memos, DO    Chief Complaint: Allergic Reaction  HPI: DENVIL CANNING is an 77 y.o. male with a past medical history significant for coronary artery disease chronic anemia due to chronic blood loss and iron infusions every 3 months, but we had recent knee replacement and had iron transfusion 5 weeks later on 11/20/2017.  Patient presents to the ED with dysarthria and dysphasia which started on the evening of 11/11/1917 post blood transfusion.  Patient states he has been receiving these blood transfusions for over 3 years without any incident.  Per patient and wife on the evening of Friday 11/20/2017 patient started having acute dysarthria and dysphagia where he felt like he was choking and his speech was slurred he got worse over the weekend.  Family thought it was likely due to his partial plate, went to the dentist on Monday for fixed the plate with improvement and resolution of symptoms.  By Wednesday of that week symptoms of dysarthria and dysphasia started; patient continued to choke and had mumbled and slurred speech.  He was evaluated by his PCP and started on steroids as possibly an allergic reaction to the iron, without any improvement.  Patient continued to complain of feeling like his tongue is swollen and was unable to tolerate p.o. And thus presented to the ED yesterday.    On admission, CT of the head did not show any acute intracranial process but revealed moderate to severe chronic small vessel ischemic disease. The patient was awake and alert without any additional new neuro deficits.  On assessment, he did not have any focal limb weakness or incoordination; also with no tongue edema; no pharyngeal or soft palate edema and uvula was midline.  Patient denies any oral or tongue pain.   Per wife speech has improved somewhat but has intermittent dysphasia when eating and dysarthric speech gets worse post meals. No trouble with word finding, confusion or grammatical  errors when speaking.   Patient denies headache, dizziness recent fevers, chills, vomiting, or recent pharyngeal infection, chest pain shortness of breath or trauma.  Patient's wife states that at baseline, the patient has mild confusion which has been ongoing for several years.  LSN: 11/20/17 tPA Given: No: Out of the time window with minor deficits Premorbid modified Rankin scale (mRS): 0   Past Medical History:  Diagnosis Date  . Anemia   . Blood transfusion without reported diagnosis 02/23/2014   3 units for iron deficiency anemia. as a baby - whenhe had pneumonia  . BPH (benign prostatic hyperplasia)   . CTS (carpal tunnel syndrome)    better  . Dyspnea   . GERD (gastroesophageal reflux disease)   . Hyperlipidemia   . Hypertension    "THEY DIAGNOSED ME YEARS AGO BUT LATELY IVE ACTUALLY HAD LOW BLOOD PRESSURES "   . Insomnia   . LBP (low back pain)   . Osteoarthritis   . Pneumonia    as a baby  . Sleep apnea    no cpap    Past Surgical History:  Procedure Laterality Date  . CARDIAC CATHETERIZATION N/A 08/15/2015   Procedure: Left Heart Cath and Coronary Angiography;  Surgeon: Lorretta Harp, MD;  Location: [facility];  Service: Cardiovascular;  Laterality: N/A;  . CARPAL TUNNEL RELEASE Bilateral 1996, 2004   Dr Lenoard Aden thumb surgery.  more than once  . COLONOSCOPY  2005, 2012   diverticulosis.   Marland Kitchen ESOPHAGOGASTRODUODENOSCOPY N/A 02/23/2014   Procedure: ESOPHAGOGASTRODUODENOSCOPY (EGD);  Surgeon: Irene Shipper, MD;  Location: [facility];  Service: Endoscopy;  Laterality: N/A;  . EYE SURGERY  2012   both cataracts, Lasik  . LEFT KNEE ARTHROSCOPY Left 07/16/2017    [facility]   . LUMBAR FUSION     x 3  . REVERSE SHOULDER ARTHROPLASTY Left 07/04/2016   Procedure: LEFT REVERSE SHOULDER ARTHROPLASTY;  Surgeon: Netta Cedars, MD;  Location: [place];  Service: Orthopedics;  Laterality: Left;  . TONSILLECTOMY  1946  . TOTAL KNEE ARTHROPLASTY  2003   Right  . TOTAL  KNEE ARTHROPLASTY Left 09/25/2017   Procedure: LEFT TOTAL KNEE ARTHROPLASTY;  Surgeon: Sydnee Cabal, MD;  Location: [facility];  Service: Orthopedics;  Laterality: Left;    Family History  Problem Relation Age of Onset  . Aneurysm Father        AAA  . Heart disease Father 75       CHF  . Alzheimer's disease Father   . Cancer Brother   . Alzheimer's disease Brother        dementia  . Stomach cancer Brother   . Cancer Mother 23       breast cancer  . Heart disease Mother 75       MI  . Autoimmune disease Son   . CAD Brother   . Colon cancer Neg Hx   . Esophageal cancer Neg Hx   . Pancreatic cancer Neg Hx   . Prostate cancer Neg Hx   . Rectal cancer Neg Hx    Social History:  reports that he quit smoking about 30 years ago. He has never used smokeless tobacco. He reports that he drinks alcohol. He reports that he does not use drugs.  Allergies: No Known Allergies  Medications:  . aspirin EC  81 mg Oral Daily  . enoxaparin (LOVENOX) injection  55 mg Subcutaneous Q24H  . finasteride  5 mg Oral Daily  . methylPREDNISolone (SOLU-MEDROL) injection  60 mg Intravenous Q6H  . pantoprazole  40 mg Oral QHS  . polyvinyl alcohol  1-2 drop Both Eyes Daily  . tamsulosin  0.8 mg Oral Daily    ROS: 13 point systems reviewed with patient and are negative except for above in HPI  Physical Examination: Blood pressure 123/64, pulse 76, temperature 98 F (36.7 C), temperature source Oral, resp. rate 14, height 6' (1.829 m), weight 108 kg (238 lb), SpO2 96 %. HEENT-  Normocephalic, no lesions, noticeable right cheek through submandibular area mild edema, normal external eye and conjunctiva.   No enlarged lymphnodes Cardiovascular- S1-S2 audible, pulses palpable throughout   Lungs-no rhonchi or wheezing noted, no excessive working breathing.  Saturations within normal limits Abdomen- All 4 quadrants palpated and nontender Musculoskeletal-no joint tenderness, s/p knee replacement Skin-warm and  dry,  Neurological Examination Mental Status: Alert, oriented, thought content appropriate.  Speech fluent without evidence of aphasia; no errors of grammar or syntax. There is subtle lingual dysarthria which waxes and wanes. Able to follow 3 step commands without difficulty. Cranial Nerves: II: Visual fields grossly normal,  III,IV, VI: ptosis not present, extra-ocular motions intact bilaterally pupils equal, round, reactive to light and accommodation V,VII: smile symmetric, facial light touch sensation normal bilaterally VIII: Hearing intact to voice IX,X: uvula rises symmetrically XI: bilateral shoulder shrug XII: midline tongue extension Motor: Right : Upper extremity   5/5    Left:     Upper extremity   5/5  Lower extremity   5/5     Lower extremity  5/5 Tone and bulk:normal tone throughout; no atrophy noted Sensory: Pinprick and light touch intact throughout, bilaterally Deep Tendon Reflexes: 2+ and symmetric throughout Plantars: Right: downgoing   Left: downgoing Cerebellar: Normal finger-to-nose, normal rapid alternating movements and normal heel-to-shin test Gait: normal gait and station  NIHSS TOTAL: 0  Ct Head Wo Contrast 11/30/2017 IMPRESSION:  1. No acute intracranial process.  2. Moderate to severe chronic small vessel ischemic disease.  Assessment: 77 y.o. male with a past medical history significant for coronary artery disease chronic anemia due to chronic blood loss and iron infusions every 3 months.  Patient presents to the ED with dysarthria and dysphasia which started on the evening of 11/11/1917 post blood transfusion.   1. Subtle right cheek through submandibular area mild edema is present on exam. Suspect possible persistent inflammatory or allergic response involving this region.  2. DDx also includes small lacunar infarction or small cortically based stroke in the region of the left frontal operculum, which could result in a speech apraxia (aphemia) resembling  a true dysarthria. DDx also includes functional etiology such as secondary gain. Initial CT of the head was negative. Currently awaiting MRI of the brain.  If MRI of the brain is negative will recommend pursuing ENT workup and evaluation. 3. Stroke Risk Factors - family history, hyperlipidemia and hypertension  Recommendations: - HgbA1c, fasting lipid panel - MRI of the brain without contrast. If positive for new stroke, obtain remainder of stroke work up.  - PT consult, OT consult, Speech consult - Prophylactic therapy-Antiplatelet med: Aspirin - dose 81mg  - Risk factor modification - Telemetry monitoring - May need ENT consult if MRI brain is negative for stroke.   History and examination documented by Jacob Moores DNP, Neuro-hospitalist Team [phone] 12/01/2017, 1:56 PM  I have seen and examined the patient. I have amended the assessment and plan above.  Electronically signed: Dr. Kerney Elbe

## 2017-12-01 NOTE — Progress Notes (Signed)
  Echocardiogram 2D Echocardiogram has been performed.  [name] T [name] 12/01/2017, 12:08 PM

## 2017-12-01 NOTE — Evaluation (Signed)
Physical Therapy Evaluation Patient Details Name: [name] MRN: [id number] DOB: Nov 24, 1940 Today's Date: 12/01/2017   History of Present Illness  pt is a 77 y/o male with pmh significant for anemia, HTN, Osteoarthritis, s/p bil TKA. admitted with 1 week h/o slurring of speech and dysphagia that got much worse the 2 days PTA.   MRI pending.  Clinical Impression  Pt is at or close to baseline functioning and should be safe at home with available assist. There are no further acute PT needs.  Will sign off at this time.     Follow Up Recommendations No PT follow up    Equipment Recommendations  None recommended by PT    Recommendations for Other Services       Precautions / Restrictions Precautions Precautions: Fall      Mobility  Bed Mobility Overal bed mobility: Independent                Transfers Overall transfer level: Independent                  Ambulation/Gait Ambulation/Gait assistance: Independent Ambulation Distance (Feet): 500 Feet Assistive device: None Gait Pattern/deviations: Step-through pattern Gait velocity: prefers slower, but able to speed up to age-appropriate level. Gait velocity interpretation: at or above normal speed for age/gender General Gait Details: stiff-legged gait, a couple of balance deviations, but no LOB.    Stairs Stairs: Yes Stairs assistance: Modified independent (Device/Increase time) Stair Management: One rail Right;Alternating pattern;Forwards Number of Stairs: 3 General stair comments: safe with rail  Wheelchair Mobility    Modified Rankin (Stroke Patients Only)       Balance                                 Standardized Balance Assessment Standardized Balance Assessment : Berg Balance Test;Dynamic Gait Index Berg Balance Test Sit to Stand: Able to stand without using hands and stabilize independently Standing Unsupported: Able to stand safely 2 minutes Sitting with Back  Unsupported but Feet Supported on Floor or Stool: Able to sit safely and securely 2 minutes Stand to Sit: Sits safely with minimal use of hands Transfers: Able to transfer safely, minor use of hands Standing Unsupported with Eyes Closed: Able to stand 10 seconds safely Standing Ubsupported with Feet Together: Able to place feet together independently and stand 1 minute safely From Standing, Reach Forward with Outstretched Arm: Can reach confidently >25 cm (10") From Standing Position, Pick up Object from Floor: Able to pick up shoe, needs supervision From Standing Position, Turn to Look Behind Over each Shoulder: Looks behind one side only/other side shows less weight shift Turn 360 Degrees: Able to turn 360 degrees safely one side only in 4 seconds or less Standing Unsupported, Alternately Place Feet on Step/Stool: Able to stand independently and complete 8 steps >20 seconds Standing Unsupported, One Foot in Front: Able to plae foot ahead of the other independently and hold 30 seconds Standing on One Leg: Tries to lift leg/unable to hold 3 seconds but remains standing independently Total Score: 48 Dynamic Gait Index Level Surface: Mild Impairment Change in Gait Speed: Mild Impairment Gait with Horizontal Head Turns: Normal Gait with Vertical Head Turns: Normal Gait and Pivot Turn: Normal Step Over Obstacle: Mild Impairment Step Around Obstacles: Normal Steps: Mild Impairment Total Score: 20       Pertinent Vitals/Pain Pain Assessment: No/denies pain    Home Living Family/patient expects  to be discharged to:: Private residence Living Arrangements: Spouse/significant other Available Help at Discharge: Family;Friend(s);Available 24 hours/day Type of Home: Other(Comment) Home Access: Level entry     Home Layout: Two level;Able to live on main level with bedroom/bathroom Home Equipment: Gilford Rile - 2 wheels;Cane - single point;Grab bars - toilet      Prior Function Level of  Independence: Independent               Hand Dominance        Extremity/Trunk Assessment        Lower Extremity Assessment Lower Extremity Assessment: Overall WFL for tasks assessed    Cervical / Trunk Assessment Cervical / Trunk Assessment: Kyphotic  Communication   Communication: No difficulties(thick tongued)  Cognition Arousal/Alertness: Awake/alert Behavior During Therapy: WFL for tasks assessed/performed Overall Cognitive Status: Within Functional Limits for tasks assessed                                        General Comments General comments (skin integrity, edema, etc.): pt's "thick-tongued" speech degraded mildly over the course of our treatment    Exercises     Assessment/Plan    PT Assessment Patent does not need any further PT services  PT Problem List         PT Treatment Interventions      PT Goals (Current goals can be found in the Care Plan section)  Acute Rehab PT Goals Patient Stated Goal: hoping to go to [place] in 2 weeks PT Goal Formulation: All assessment and education complete, DC therapy Potential to Achieve Goals: Good    Frequency     Barriers to discharge        Co-evaluation               AM-PAC PT "6 Clicks" Daily Activity  Outcome Measure Difficulty turning over in bed (including adjusting bedclothes, sheets and blankets)?: None Difficulty moving from lying on back to sitting on the side of the bed? : None Difficulty sitting down on and standing up from a chair with arms (e.g., wheelchair, bedside commode, etc,.)?: None Help needed moving to and from a bed to chair (including a wheelchair)?: None Help needed walking in hospital room?: None Help needed climbing 3-5 steps with a railing? : A Little 6 Click Score: 23    End of Session   Activity Tolerance: Patient tolerated treatment well Patient left: in bed;with call bell/phone within reach Nurse Communication: Mobility status PT Visit  Diagnosis: Unsteadiness on feet (R26.81)    Time: 7017-7939 PT Time Calculation (min) (ACUTE ONLY): 40 min   Charges:   PT Evaluation $PT Eval Moderate Complexity: 1 Mod PT Treatments $Gait Training: 8-22 mins $Therapeutic Activity: 8-22 mins   PT G Codes:        27-Dec-2017  Donnella Sham, PT [phone]  (pager)  Tessie Fass Rubylee Zamarripa 12/27/2017, 4:43 PM

## 2017-12-01 NOTE — Progress Notes (Signed)
SLP Cancellation Note  Patient Details Name: [name] MRN: [id number] DOB: September 17, 1940   Cancelled treatment:       Reason Eval/Treat Not Completed: SLP screened, no cognitive linguistic deficits appreciated or reported. No needs identified, will sign off. ST following for diet tolerance/education.  Celia B. Quentin Ore [facility], CCC-SLP Speech Language Pathologist [phone]  Shonna Chock 12/01/2017, 9:35 AM

## 2017-12-01 NOTE — Evaluation (Signed)
Clinical/Bedside Swallow Evaluation Patient Details  Name: [name] MRN: [id number] Date of Birth: [birth year]  Today's Date: 12/01/2017 Time: SLP Start Time (ACUTE ONLY): 0900 SLP Stop Time (ACUTE ONLY): 0925 SLP Time Calculation (min) (ACUTE ONLY): 25 min  Past Medical History:  Past Medical History:  Diagnosis Date  . Anemia   . Blood transfusion without reported diagnosis 02/23/2014   3 units for iron deficiency anemia. as a baby - whenhe had pneumonia  . BPH (benign prostatic hyperplasia)   . CTS (carpal tunnel syndrome)    better  . Dyspnea   . GERD (gastroesophageal reflux disease)   . Hyperlipidemia   . Hypertension    "THEY DIAGNOSED ME YEARS AGO BUT LATELY IVE ACTUALLY HAD LOW BLOOD PRESSURES "   . Insomnia   . LBP (low back pain)   . Osteoarthritis   . Pneumonia    as a baby  . Sleep apnea    no cpap   Past Surgical History:  Past Surgical History:  Procedure Laterality Date  . CARDIAC CATHETERIZATION N/A 08/15/2015   Procedure: Left Heart Cath and Coronary Angiography;  Surgeon: Lorretta Harp, MD;  Location: [place] [facility];  Service: Cardiovascular;  Laterality: N/A;  . CARPAL TUNNEL RELEASE Bilateral 1996, 2004   Dr Lenoard Aden thumb surgery.  more than once  . COLONOSCOPY  2005, 2012   diverticulosis.   Marland Kitchen ESOPHAGOGASTRODUODENOSCOPY N/A 02/23/2014   Procedure: ESOPHAGOGASTRODUODENOSCOPY (EGD);  Surgeon: Irene Shipper, MD;  Location: [facility];  Service: Endoscopy;  Laterality: N/A;  . EYE SURGERY  2012   both cataracts, Lasik  . LEFT KNEE ARTHROSCOPY Left 07/16/2017    [facility]   . LUMBAR FUSION     x 3  . REVERSE SHOULDER ARTHROPLASTY Left 07/04/2016   Procedure: LEFT REVERSE SHOULDER ARTHROPLASTY;  Surgeon: Netta Cedars, MD;  Location: [place];  Service: Orthopedics;  Laterality: Left;  . TONSILLECTOMY  1946  . TOTAL KNEE ARTHROPLASTY  2003   Right  . TOTAL KNEE ARTHROPLASTY Left 09/25/2017   Procedure: LEFT TOTAL KNEE ARTHROPLASTY;   Surgeon: Sydnee Cabal, MD;  Location: [facility];  Service: Orthopedics;  Laterality: Left;   HPI:  77 year old male admitted 11/30/17 with stroke like symptoms, orofacial swelling. PMH significant for GERD. Head CT and CXR negative, MRI pending.   Assessment / Plan / Recommendation Clinical Impression  SLP arrived to complete BSE. Wife reports speech intelligibility is significantly improved since even last night, and swelling has reduced. Pt exhibits symmetrical oral and facial ROM, CN exam is unremarkable, strength and function are adequate, however, wife and visitors indicate speech has still not returned to pt normal. Mild dysarthria is detected, however, speech is fully intelligible. Pt was observed at breakfast with full liquids, puree, and solid trials. No overt oral issues noted. No overt s/s aspiration on any consistency. Will advance diet to regular and thin liquids, and follow for assessment of diet tolerance and continued resolution of dysarthria. Thus far, CT and CXR are negative. MRI pending.    SLP Visit Diagnosis: Dysphagia, unspecified (R13.10)    Aspiration Risk  Mild aspiration risk    Diet Recommendation Regular;Thin liquid   Liquid Administration via: Cup;Straw Medication Administration: Whole meds with liquid Supervision: Patient able to self feed Compensations: Minimize environmental distractions;Small sips/bites;Slow rate Postural Changes: Seated upright at 90 degrees;Remain upright for at least 30 minutes after po intake    Other  Recommendations Oral Care Recommendations: Oral care BID  Follow up Recommendations (not anticipated)      Frequency and Duration min 1 x/week  1 week       Prognosis Prognosis for Safe Diet Advancement: Guarded      Swallow Study   General Date of Onset: 11/30/17 HPI: 77 year old male admitted 11/30/17 with stroke like symptoms, orofacial swelling. PMH significant for GERD. Head CT and CXR negative, MRI pending. Type of Study:  Bedside Swallow Evaluation Previous Swallow Assessment: none Diet Prior to this Study: Other (Comment)(full liquid) Temperature Spikes Noted: No Respiratory Status: Room air History of Recent Intubation: No Behavior/Cognition: Alert;Pleasant mood;Cooperative Oral Cavity Assessment: Within Functional Limits Oral Care Completed by SLP: No Oral Cavity - Dentition: Adequate natural dentition Vision: Functional for self-feeding Self-Feeding Abilities: Able to feed self Patient Positioning: Upright in bed Baseline Vocal Quality: Normal Volitional Cough: Strong Volitional Swallow: Able to elicit    Oral/Motor/Sensory Function Overall Oral Motor/Sensory Function: (minimal dysarthria) Facial ROM: Within Functional Limits Facial Symmetry: Within Functional Limits Facial Strength: Within Functional Limits Facial Sensation: Within Functional Limits Lingual ROM: Within Functional Limits Lingual Symmetry: Within Functional Limits Lingual Strength: Within Functional Limits Lingual Sensation: Within Functional Limits Velum: Within Functional Limits Mandible: Within Functional Limits   Ice Chips Ice chips: Not tested   Thin Liquid Thin Liquid: Within functional limits Presentation: Cup    Nectar Thick Nectar Thick Liquid: Not tested   Honey Thick Honey Thick Liquid: Not tested   Puree Puree: Within functional limits Presentation: Spoon   Solid   GO   Solid: Within functional limits Presentation: Dargan B. Quentin Ore [facility] [place] Speech Language Pathologist [phone]  Shonna Chock 12/01/2017,9:34 AM

## 2017-12-01 NOTE — Telephone Encounter (Signed)
Pt went to ED

## 2017-12-01 NOTE — Progress Notes (Signed)
PROGRESS NOTE  [name] QQV:[id number] DOB: 13-Nov-1940 DOA: 11/30/2017 PCP: Cassandria Anger, MD  HPI/Recap of past 24 hours: [name]  is a 77 y.o. male, with past medical history significant for coronary artery disease, anemia with chronic blood loss and iron infusion who is presenting with one-week history of slurring of speech and dysphagia that has got much worse the last 2 days.  The patient had iron infusion at 10:00 in the morning and his symptoms started at night of that day.  Patient has been receiving these blood transfusions for years without any reactions.  Patient feels that his tongue is swollen and lately has not been able to tolerate p.o. the family was worried especially today when he did not have anything to eat all day.  Patient denies any visual symptoms or headaches.  He was seen by his primary medical doctor who started him on prednisone for possible allergic reaction to iron.  Again symptoms got worse and the patient presented to the emergency room.  12/01/2017: Patient seen and examined with his wife, brother, and best friend at bedside.  Wife reports the patient is still not back to his baseline.  She reports his speech is still slurred and the right side of his face appears heavy.  MRI ordered and neurology consulted.  Assessment/Plan: Active Problems:   CVA (cerebral vascular accident) ([place])  Dysarthria rule out CVA Personally reviewed CT head done on 11/30/2017 with family members in the room.  It is negative for any acute intra-cranial findings MRI ordered Neurology consulted and following.  Highly appreciated Stroke workup in place 2D echo, carotid duplex ultrasound, lipid panel, A1c Speech therapy PT to evaluate  Mild cognitive impairment As reported by family members Reorient as needed  Hypertension Blood pressure stable On Lasix at home  Chronic depression/anxiety Continue duloxetine  Hyperlipidemia Continue pravastatin Lipid  panel ordered  BPH with L UTS Continue tamsulosin and finasteride  Obesity BMI 32 Weight loss outpatient  Code Status: Full code  Family Communication: Wife at bedside  Disposition Plan: Home when clinically stable   Consultants:  Neurology  Procedures:  None  Antimicrobials:  None  DVT prophylaxis: SCDs   Objective: Vitals:   12/01/17 0228 12/01/17 0436 12/01/17 1000 12/01/17 1446  BP: 137/71 137/75 123/64 (!) 142/69  Pulse: (!) 58 64 76 88  Resp: 18 18 14 20   Temp: 98 F (36.7 C) 97.8 F (36.6 C) 98 F (36.7 C) 98.1 F (36.7 C)  TempSrc:   Oral Oral  SpO2: 95% 95% 96% 95%  Weight:      Height:        Intake/Output Summary (Last 24 hours) at 12/01/2017 1610 Last data filed at 12/01/2017 0555 Gross per 24 hour  Intake 460 ml  Output -  Net 460 ml   Filed Weights   11/30/17 1523  Weight: 108 kg (238 lb)    Exam:   General: 77 year old Caucasian male well-developed well-nourished in no acute distress.  Alert and oriented x3.  Mild slurring of speech.  Mild difficulty in finding words.  Cardiovascular: Regular rate and rhythm with no rubs or gallops.  No thyromegaly or JVD present.  Respiratory: Clear to auscultation with no wheezes or rales  Abdomen: Obese nontender nondistended normal bowel sounds x4  Musculoskeletal: Moves all 4 extremities strength equal in all 4 extremities  Skin: No ulcerative lesions  Psychiatry: Mood is appropriate for condition and setting.   Data Reviewed: CBC: Recent Labs  Lab  11/26/17 1027 11/30/17 1550 11/30/17 1656  WBC 5.4 6.8  --   NEUTROABS 3.1 3.4  --   HGB 12.0* 12.0* 12.9*  HCT 36.5* 39.2 38.0*  MCV 88.3 92.5  --   PLT 285.0 276  --    Basic Metabolic Panel: Recent Labs  Lab 11/26/17 1027 11/30/17 1550 11/30/17 1656  NA 138 139 140  K 4.5 3.6 3.6  CL 102 99* 99*  CO2 30 29  --   GLUCOSE 104* 93 90  BUN 13 17 17   CREATININE 0.74 0.84 0.80  CALCIUM 9.6 9.4  --    GFR: Estimated  Creatinine Clearance: 99.8 mL/min (by C-G formula based on SCr of 0.8 mg/dL). Liver Function Tests: Recent Labs  Lab 11/26/17 1027 11/30/17 1550  AST 16 22  ALT 12 22  ALKPHOS 74 66  BILITOT 0.5 0.6  PROT 7.0 6.7  ALBUMIN 4.0 3.9   No results for input(s): LIPASE, AMYLASE in the last 168 hours. No results for input(s): AMMONIA in the last 168 hours. Coagulation Profile: Recent Labs  Lab 11/30/17 1550  INR 0.99   Cardiac Enzymes: No results for input(s): CKTOTAL, CKMB, CKMBINDEX, TROPONINI in the last 168 hours. BNP (last 3 results) No results for input(s): PROBNP in the last 8760 hours. HbA1C: Recent Labs    12/01/17 0512  HGBA1C 4.6*   CBG: No results for input(s): GLUCAP in the last 168 hours. Lipid Profile: Recent Labs    12/01/17 0512  CHOL 182  HDL 48  LDLCALC 99  TRIG 174*  CHOLHDL 3.8   Thyroid Function Tests: No results for input(s): TSH, T4TOTAL, FREET4, T3FREE, THYROIDAB in the last 72 hours. Anemia Panel: No results for input(s): VITAMINB12, FOLATE, FERRITIN, TIBC, IRON, RETICCTPCT in the last 72 hours. Urine analysis:    Component Value Date/Time   COLORURINE YELLOW 11/30/2017 2024   APPEARANCEUR CLEAR 11/30/2017 2024   LABSPEC 1.011 11/30/2017 2024   PHURINE 6.0 11/30/2017 2024   GLUCOSEU NEGATIVE 11/30/2017 2024   GLUCOSEU NEGATIVE 03/01/2012 1029   HGBUR NEGATIVE 11/30/2017 2024   BILIRUBINUR NEGATIVE 11/30/2017 2024   BILIRUBINUR negative 06/11/2016 1106   [place] 11/30/2017 2024   PROTEINUR NEGATIVE 11/30/2017 2024   UROBILINOGEN negative 06/11/2016 1106   UROBILINOGEN 1.0 03/07/2014 0019   NITRITE NEGATIVE 11/30/2017 2024   LEUKOCYTESUR NEGATIVE 11/30/2017 2024   Sepsis Labs: @LABRCNTIP (procalcitonin:4,lacticidven:4)  )No results found for this or any previous visit (from the past 240 hour(s)).    Studies: Dg Chest 2 View  Result Date: 11/30/2017 CLINICAL DATA:  Tongue and facial swelling for several days.  Difficulty speaking. EXAM: CHEST - 2 VIEW COMPARISON:  08/14/2015 FINDINGS: The heart size and mediastinal contours are within normal limits. Chronic elevation of left hemidiaphragm is unchanged. Both lungs are clear. Several old left rib fracture deformities are again seen. Left shoulder prosthesis noted. IMPRESSION: Chronic elevation of left hemidiaphragm. No active cardiopulmonary disease. Electronically Signed   By: Earle Gell M.D.   On: 11/30/2017 18:50   Ct Head Wo Contrast  Result Date: 11/30/2017 CLINICAL DATA:  Allergic reaction after iron infusion. EXAM: CT HEAD WITHOUT CONTRAST TECHNIQUE: Contiguous axial images were obtained from the base of the skull through the vertex without intravenous contrast. COMPARISON:  MRI of the head December 14, 2014 FINDINGS: BRAIN: No intraparenchymal hemorrhage, mass effect nor midline shift. Patchy to confluent supratentorial white matter hypodensities within normal range for patient's age, though non-specific are most compatible with chronic small vessel ischemic disease. Tiny  RIGHT thalamus lacunar infarct versus perivascular space. No acute large vascular territory infarcts. No abnormal extra-axial fluid collections. Basal cisterns are patent. VASCULAR: Mild calcific atherosclerosis of the carotid siphons. SKULL: No skull fracture. No significant scalp soft tissue swelling. LEFT frontal scalp scarring. SINUSES/ORBITS: The mastoid air-cells and included paranasal sinuses are well-aerated.The included ocular globes and orbital contents are non-suspicious. Status post bilateral ocular lens implants. OTHER: None. IMPRESSION: 1. No acute intracranial process. 2. Moderate to severe chronic small vessel ischemic disease. Electronically Signed   By: Elon Alas M.D.   On: 11/30/2017 17:03    Scheduled Meds: . aspirin EC  81 mg Oral Daily  . enoxaparin (LOVENOX) injection  55 mg Subcutaneous Q24H  . finasteride  5 mg Oral Daily  . methylPREDNISolone (SOLU-MEDROL)  injection  60 mg Intravenous Q6H  . pantoprazole  40 mg Oral QHS  . polyvinyl alcohol  1-2 drop Both Eyes Daily  . tamsulosin  0.8 mg Oral Daily    Continuous Infusions: . dextrose 5 % and 0.45% NaCl 75 mL/hr at 12/01/17 1522     LOS: 1 day     Kayleen Memos, MD [facility] Pager [phone]  If 7PM-7AM, please contact night-coverage [email or web address] Password [email or web address] 12/01/2017, 4:10 PM

## 2017-12-01 NOTE — Progress Notes (Signed)
Carotid duplex prelim: 1-39% ICA stenosis.  Joash Tony Eunice, RDMS, RVT   

## 2017-12-02 ENCOUNTER — Encounter (HOSPITAL_COMMUNITY): Payer: Self-pay | Admitting: *Deleted

## 2017-12-02 ENCOUNTER — Observation Stay (HOSPITAL_COMMUNITY): Payer: Medicare Other

## 2017-12-02 DIAGNOSIS — I700 Atherosclerosis of aorta: Secondary | ICD-10-CM | POA: Diagnosis not present

## 2017-12-02 DIAGNOSIS — R7989 Other specified abnormal findings of blood chemistry: Secondary | ICD-10-CM | POA: Diagnosis not present

## 2017-12-02 DIAGNOSIS — I639 Cerebral infarction, unspecified: Secondary | ICD-10-CM | POA: Diagnosis not present

## 2017-12-02 DIAGNOSIS — R1310 Dysphagia, unspecified: Secondary | ICD-10-CM | POA: Diagnosis not present

## 2017-12-02 LAB — CBC
HCT: 39.4 % (ref 39.0–52.0)
Hemoglobin: 12.3 g/dL — ABNORMAL LOW (ref 13.0–17.0)
MCH: 28.1 pg (ref 26.0–34.0)
MCHC: 31.2 g/dL (ref 30.0–36.0)
MCV: 90.2 fL (ref 78.0–100.0)
Platelets: 295 10*3/uL (ref 150–400)
RBC: 4.37 MIL/uL (ref 4.22–5.81)
RDW: 16.8 % — ABNORMAL HIGH (ref 11.5–15.5)
WBC: 10.2 10*3/uL (ref 4.0–10.5)

## 2017-12-02 LAB — COMPREHENSIVE METABOLIC PANEL
ALT: 24 U/L (ref 17–63)
AST: 19 U/L (ref 15–41)
Albumin: 3.6 g/dL (ref 3.5–5.0)
Alkaline Phosphatase: 67 U/L (ref 38–126)
Anion gap: 10 (ref 5–15)
BUN: 17 mg/dL (ref 6–20)
CO2: 25 mmol/L (ref 22–32)
Calcium: 9.7 mg/dL (ref 8.9–10.3)
Chloride: 103 mmol/L (ref 101–111)
Creatinine, Ser: 0.71 mg/dL (ref 0.61–1.24)
GFR calc Af Amer: >60 mL/min (ref >60)
GFR calc non Af Amer: >60 mL/min (ref >60)
Glucose, Bld: 152 mg/dL — ABNORMAL HIGH (ref 65–99)
Potassium: 3.8 mmol/L (ref 3.5–5.1)
Sodium: 138 mmol/L (ref 135–145)
Total Bilirubin: 0.5 mg/dL (ref 0.3–1.2)
Total Protein: 6.4 g/dL — ABNORMAL LOW (ref 6.5–8.1)

## 2017-12-02 LAB — TROPONIN I: Troponin I: <0.03 ng/mL (ref <0.03)

## 2017-12-02 LAB — D-DIMER, QUANTITATIVE: D-Dimer, Quant: 0.67 ug/mL-FEU — ABNORMAL HIGH (ref 0.00–0.50)

## 2017-12-02 MED ORDER — OXYMETAZOLINE HCL 0.05 % NA SOLN
1.0000 | Freq: Once | NASAL | Status: DC | PRN
  Filled 2017-12-02: qty 15

## 2017-12-02 MED ORDER — LIDOCAINE-EPINEPHRINE (PF) 1 %-1:200000 IJ SOLN
0.0000 mL | Freq: Once | INTRAMUSCULAR | Status: DC | PRN
  Filled 2017-12-02: qty 30

## 2017-12-02 MED ORDER — LIDOCAINE HCL 4 % EX SOLN
0.0000 mL | Freq: Once | CUTANEOUS | Status: DC | PRN
  Filled 2017-12-02: qty 50

## 2017-12-02 MED ORDER — TRIPLE ANTIBIOTIC 3.5-400-5000 EX OINT
1.0000 "application " | TOPICAL_OINTMENT | Freq: Once | CUTANEOUS | Status: DC | PRN
  Filled 2017-12-02: qty 1

## 2017-12-02 MED ORDER — LIDOCAINE HCL 2 % EX GEL
1.0000 "application " | Freq: Once | CUTANEOUS | Status: DC | PRN
  Filled 2017-12-02: qty 5

## 2017-12-02 MED ORDER — PANTOPRAZOLE SODIUM 40 MG PO TBEC
40.0000 mg | DELAYED_RELEASE_TABLET | Freq: Two times a day (BID) | ORAL | Status: DC
  Administered 2017-12-04 – 2017-12-05 (×2): 40 mg via ORAL
  Filled 2017-12-02 (×4): qty 1

## 2017-12-02 MED ORDER — SILVER NITRATE-POT NITRATE 75-25 % EX MISC
1.0000 | Freq: Once | CUTANEOUS | Status: DC | PRN
  Filled 2017-12-02 (×2): qty 1

## 2017-12-02 MED ORDER — IOPAMIDOL (ISOVUE-370) INJECTION 76%
INTRAVENOUS | Status: AC
  Filled 2017-12-02: qty 100

## 2017-12-02 MED ORDER — IOPAMIDOL (ISOVUE-370) INJECTION 76%
100.0000 mL | Freq: Once | INTRAVENOUS | Status: AC | PRN
  Administered 2017-12-02: 100 mL via INTRAVENOUS

## 2017-12-02 NOTE — Progress Notes (Signed)
[facility]                                                                              Patient Demographics  [name], is a 77 y.o. male, DOB - [birth year], TIW:[id number]  Admit date - 11/30/2017   Admitting Physician Merton Border, MD  Outpatient Primary MD for the patient is [name], [name], MD  Outpatient specialists:   LOS - 1  days   Medical records reviewed and are as summarized below:    Chief Complaint  Patient presents with  . Allergic Reaction       Brief summary   Patient is a 77 year old male with CAD, chronic anemia, iron deficiency, receives iron infusions every 3 months, recent knee replacement surgery in February 2019, had iron transfusion on 3/29.  Patient presented to ED with dysphagia and dysarthria that started evening of 3/29, felt he was choking and his speech was slurred.  Family thought it could be due to dental problem and went to the dentist on 4/1, had the partial plate fixed with improvement and resolution of symptoms.  Patient also felt swelling on the right side of his face.  However subsequently, on 4/3 patient's symptoms restarted and he continued to choke and had slurred speech.  Patient was evaluated by PCP who started the patient on steroids as possibly an allergic reaction to the iron without significant improvement.  Patient presented to ED, initially thought to have possible CVA, evaluated by neurology.  MRI however negative for CVA.   Assessment & Plan    Active Problems: Dysarthria with dysphagia -Unclear etiology, per patient started after the iron transfusion -Patient was evaluated by neurology, stroke workup negative, MRI negative for acute CVA, MRA showed no aneurysms or stenosis. - 2D echo showed EF of 60-65%, carotid Dopplers 1-39% ICA stenosis bilaterally -Patient was evaluated by speech therapy on 4/9 and observed no oral issues, aspiration with any consistency.  -However this morning, patient  complaining of same symptoms of choking and dysphagia after bite of food.  Esophagogram showed small aspiration however normal esophageal mucosal pattern, no stricture or ulceration or any significant abnormality.  Mild nonspecific esophageal dysmotility, mild GERD -Currently on PPI -Evaluated by neurology, recommended ENT exam for unclear symptoms of dysphagia, ENT consulted.  Able to take pills fine. -PT OT recommended no PT follow-up - NPO, continue IV fluid hydration  Atypical chest pain -Troponin x1-, EKG showed no acute ST-T wave changes.  Per patient he has off and on chest pain, worse with anxiety, in the left side and radiating to right side and abdomen - Given patient had recent knee replacement surgery in February 2019, order d-dimer, 0.67, follow with CT angiogram of the chest to rule out PE  BPH Continue finasteride   Code Status: Full CODE STATUS DVT Prophylaxis:  Lovenox  Family Communication: Discussed in detail with the patient, all imaging results, lab results explained to the patient and patient's wife at the bedside   Disposition Plan: When the symptoms resolved and tolerating full liquid or solid diet, ENT evaluation pending  Time Spent in minutes 45 minutes  Procedures:   MRI,  MRA brain 2D echo  Consultants:   Neurology ENT  Antimicrobials:      Medications  Scheduled Meds: . aspirin EC  81 mg Oral Daily  . enoxaparin (LOVENOX) injection  55 mg Subcutaneous Q24H  . finasteride  5 mg Oral Daily  . LORazepam  0.5 mg Intravenous Once  . methylPREDNISolone (SOLU-MEDROL) injection  60 mg Intravenous Q6H  . pantoprazole  40 mg Oral BID AC  . polyvinyl alcohol  1-2 drop Both Eyes Daily  . tamsulosin  0.8 mg Oral Daily   Continuous Infusions: . dextrose 5 % and 0.45% NaCl 75 mL/hr at 12/02/17 0523   PRN Meds:.acetaminophen **OR** acetaminophen (TYLENOL) oral liquid 160 mg/5 mL **OR** acetaminophen, senna-docusate   Antibiotics   Anti-infectives  (From admission, onward)   None        Subjective:   [name] was seen and examined today.  At the time of examination, patient reported atypical chest pain 5/10 in the left side radiating to right and epigastrium.  States anxiety makes it worse, no positional or pleuritic component.  No shortness of breath, palpitations, dizziness.  Started coughing after attempting to eat breakfast.  No acute events overnight.    Objective:   Vitals:   12/01/17 1000 12/01/17 1446 12/01/17 2318 12/02/17 0558  BP: 123/64 (!) 142/69 135/69 (!) 111/49  Pulse: 76 88 66 64  Resp: 14 20 18 18   Temp: 98 F (36.7 C) 98.1 F (36.7 C) 98 F (36.7 C) 99.1 F (37.3 C)  TempSrc: Oral Oral    SpO2: 96% 95% 91% 91%  Weight:      Height:        Intake/Output Summary (Last 24 hours) at 12/02/2017 1304 Last data filed at 12/02/2017 1100 Gross per 24 hour  Intake 2133.75 ml  Output -  Net 2133.75 ml     Wt Readings from Last 3 Encounters:  11/30/17 108 kg (238 lb)  11/26/17 108 kg (238 lb)  11/13/17 111.1 kg (245 lb)     Exam  General: Alert and oriented x 3, NAD, speech improving  Eyes: PERRLA, EOMI, Anicteric Sclera,  HEENT:    Cardiovascular: S1 S2 auscultated, no rubs, murmurs or gallops. Regular rate and rhythm.  Respiratory: Clear to auscultation bilaterally, no wheezing, rales or rhonchi  Gastrointestinal: Soft, nontender, nondistended, + bowel sounds  Ext: no pedal edema bilaterally  Neuro: AAOx3, Cr N's II- XII. Strength 5/5 upper and lower extremities bilaterally  Musculoskeletal: No digital cyanosis, clubbing  Skin: No rashes  Psych: anxious, alert and oriented x3    Data Reviewed:  I have personally reviewed following labs and imaging studies  Micro Results No results found for this or any previous visit (from the past 240 hour(s)).  Radiology Reports Dg Chest 2 View  Result Date: 11/30/2017 CLINICAL DATA:  Tongue and facial swelling for several days.  Difficulty speaking. EXAM: CHEST - 2 VIEW COMPARISON:  08/14/2015 FINDINGS: The heart size and mediastinal contours are within normal limits. Chronic elevation of left hemidiaphragm is unchanged. Both lungs are clear. Several old left rib fracture deformities are again seen. Left shoulder prosthesis noted. IMPRESSION: Chronic elevation of left hemidiaphragm. No active cardiopulmonary disease. Electronically Signed   By: Earle Gell M.D.   On: 11/30/2017 18:50   Ct Head Wo Contrast  Result Date: 11/30/2017 CLINICAL DATA:  Allergic reaction after iron infusion. EXAM: CT HEAD WITHOUT CONTRAST TECHNIQUE: Contiguous axial images were obtained from the base of the skull through the vertex  without intravenous contrast. COMPARISON:  MRI of the head December 14, 2014 FINDINGS: BRAIN: No intraparenchymal hemorrhage, mass effect nor midline shift. Patchy to confluent supratentorial white matter hypodensities within normal range for patient's age, though non-specific are most compatible with chronic small vessel ischemic disease. Tiny RIGHT thalamus lacunar infarct versus perivascular space. No acute large vascular territory infarcts. No abnormal extra-axial fluid collections. Basal cisterns are patent. VASCULAR: Mild calcific atherosclerosis of the carotid siphons. SKULL: No skull fracture. No significant scalp soft tissue swelling. LEFT frontal scalp scarring. SINUSES/ORBITS: The mastoid air-cells and included paranasal sinuses are well-aerated.The included ocular globes and orbital contents are non-suspicious. Status post bilateral ocular lens implants. OTHER: None. IMPRESSION: 1. No acute intracranial process. 2. Moderate to severe chronic small vessel ischemic disease. Electronically Signed   By: Elon Alas M.D.   On: 11/30/2017 17:03   Mr Jeri Cos IW Contrast  Result Date: 12/02/2017 CLINICAL DATA:  Initial evaluation for acute ataxia. EXAM: MRI HEAD WITHOUT AND WITH CONTRAST MRA HEAD WITHOUT CONTRAST TECHNIQUE:  Multiplanar, multiecho pulse sequences of the brain and surrounding structures were obtained without and with intravenous contrast. Angiographic images of the head were obtained using MRA technique without contrast. CONTRAST:  54mL MULTIHANCE GADOBENATE DIMEGLUMINE 529 MG/ML IV SOLN COMPARISON:  Prior CT from 11/30/2017. FINDINGS: MRI HEAD FINDINGS Brain: Study mildly degraded by motion artifact. Diffuse prominence of the CSF containing spaces is compatible with generalized age-related cerebral atrophy. Patchy and confluent T2/FLAIR hyperintensity within the periventricular and deep white matter both cerebral hemispheres most consistent with chronic small vessel ischemic disease, moderate in nature. No abnormal foci of restricted diffusion to suggest acute or subacute ischemia. Gray-white matter differentiation maintained. No encephalomalacia to suggest chronic infarction. No foci of susceptibility artifact to suggest acute or chronic intracranial hemorrhage. No mass lesion, midline shift or mass effect. Ventricles normal size without hydrocephalus. No extra-axial fluid collection. Major dural sinuses grossly patent. No abnormal enhancement. Pituitary gland suprasellar region normal. Midline structures intact and normal. Vascular: Major intracranial vascular flow voids are maintained. Skull and upper cervical spine: Craniocervical junction within normal limits. Upper cervical spine normal. Bone marrow signal intensity mildly decreased on T1 weighted imaging, suspected be related to patient's history of anemia. No discrete osseous lesions. Scalp soft tissues within normal limits. Sinuses/Orbits: Globes normal soft tissues within normal limits. Patient status post cataract extraction bilaterally. Few small retention cysts noted within left maxillary sinus. Paranasal sinuses are otherwise clear. No mastoid effusion. Inner ear structures normal. Other: None. MRA HEAD FINDINGS ANTERIOR CIRCULATION: Distal cervical  segments of the internal carotid arteries are patent with antegrade flow. Petrous, cavernous, and supraclinoid segments patent without flow-limiting stenosis. Ophthalmic arteries patent proximally. ICA termini widely patent. A1 segments widely patent. Anterior communicating artery not well delineated. Anterior cerebral arteries widely patent to their distal aspects without stenosis. M1 segments patent without stenosis or occlusion. No proximal M2 occlusion. Distal MCA branches well perfused and symmetric. POSTERIOR CIRCULATION: Dominant right vertebral artery patent to the vertebrobasilar junction without stenosis. Partially visualized left vertebral artery hypoplastic and largely terminates in PICA. Posterior inferior cerebral arteries themselves are patent proximally. Basilar artery widely patent to its distal aspect. Superior cerebral arteries patent bilaterally. Both of the posterior cerebral artery supplied via the basilar and are well perfused to their distal aspects without stenosis. No intracranial aneurysm. IMPRESSION: MRI HEAD IMPRESSION 1. No acute intracranial abnormality identified. 2. Generalized age-related cerebral atrophy with moderate cerebral white matter disease, nonspecific, but most commonly related to  chronic small vessel ischemic changes. MRA HEAD IMPRESSION Negative intracranial MRA. No large vessel occlusion. No high-grade or correctable stenosis identified. Electronically Signed   By: Jeannine Boga M.D.   On: 12/02/2017 00:36   Dg Esophagus  Result Date: 12/02/2017 CLINICAL DATA:  Dysphagia with purees during bedside swallow study this morning. EXAM: ESOPHOGRAM / BARIUM SWALLOW / BARIUM TABLET STUDY TECHNIQUE: Combined double contrast and single contrast examination performed using effervescent crystals, thick barium liquid, and thin barium liquid. The patient was observed with fluoroscopy swallowing a 13 mm barium sulphate tablet. FLUOROSCOPY TIME:  Fluoroscopy Time:  3  minutes, 6 seconds. Radiation Exposure Index (if provided by the fluoroscopic device): 47.5 mGy. Number of Acquired Spot Images: 0 COMPARISON:  None. FINDINGS: The pharyngeal phase of swallowing demonstrated small amount of aspiration with thick barium. Mild tertiary contractions in the mid to distal esophagus. No obstruction to the forward flow of contrast throughout the esophagus and into the stomach. Normal esophageal course and contour. Normal esophageal mucosal pattern. No esophageal stricture, ulceration, or other significant abnormality. No hiatal hernia. Mild gastroesophageal reflux into the distal esophagus was elicited using the water siphon test. A 13 mm barium tablet passed without difficulty into the stomach. IMPRESSION: 1. Small aspiration with thick barium. 2. Mild nonspecific esophageal dysmotility. 3. Mild gastroesophageal reflux. Electronically Signed   By: Titus Dubin M.D.   On: 12/02/2017 12:10   Mr Jodene Nam Head Wo Contrast  Result Date: 12/02/2017 CLINICAL DATA:  Initial evaluation for acute ataxia. EXAM: MRI HEAD WITHOUT AND WITH CONTRAST MRA HEAD WITHOUT CONTRAST TECHNIQUE: Multiplanar, multiecho pulse sequences of the brain and surrounding structures were obtained without and with intravenous contrast. Angiographic images of the head were obtained using MRA technique without contrast. CONTRAST:  24mL MULTIHANCE GADOBENATE DIMEGLUMINE 529 MG/ML IV SOLN COMPARISON:  Prior CT from 11/30/2017. FINDINGS: MRI HEAD FINDINGS Brain: Study mildly degraded by motion artifact. Diffuse prominence of the CSF containing spaces is compatible with generalized age-related cerebral atrophy. Patchy and confluent T2/FLAIR hyperintensity within the periventricular and deep white matter both cerebral hemispheres most consistent with chronic small vessel ischemic disease, moderate in nature. No abnormal foci of restricted diffusion to suggest acute or subacute ischemia. Gray-white matter differentiation  maintained. No encephalomalacia to suggest chronic infarction. No foci of susceptibility artifact to suggest acute or chronic intracranial hemorrhage. No mass lesion, midline shift or mass effect. Ventricles normal size without hydrocephalus. No extra-axial fluid collection. Major dural sinuses grossly patent. No abnormal enhancement. Pituitary gland suprasellar region normal. Midline structures intact and normal. Vascular: Major intracranial vascular flow voids are maintained. Skull and upper cervical spine: Craniocervical junction within normal limits. Upper cervical spine normal. Bone marrow signal intensity mildly decreased on T1 weighted imaging, suspected be related to patient's history of anemia. No discrete osseous lesions. Scalp soft tissues within normal limits. Sinuses/Orbits: Globes normal soft tissues within normal limits. Patient status post cataract extraction bilaterally. Few small retention cysts noted within left maxillary sinus. Paranasal sinuses are otherwise clear. No mastoid effusion. Inner ear structures normal. Other: None. MRA HEAD FINDINGS ANTERIOR CIRCULATION: Distal cervical segments of the internal carotid arteries are patent with antegrade flow. Petrous, cavernous, and supraclinoid segments patent without flow-limiting stenosis. Ophthalmic arteries patent proximally. ICA termini widely patent. A1 segments widely patent. Anterior communicating artery not well delineated. Anterior cerebral arteries widely patent to their distal aspects without stenosis. M1 segments patent without stenosis or occlusion. No proximal M2 occlusion. Distal MCA branches well perfused and symmetric. POSTERIOR CIRCULATION:  Dominant right vertebral artery patent to the vertebrobasilar junction without stenosis. Partially visualized left vertebral artery hypoplastic and largely terminates in PICA. Posterior inferior cerebral arteries themselves are patent proximally. Basilar artery widely patent to its distal  aspect. Superior cerebral arteries patent bilaterally. Both of the posterior cerebral artery supplied via the basilar and are well perfused to their distal aspects without stenosis. No intracranial aneurysm. IMPRESSION: MRI HEAD IMPRESSION 1. No acute intracranial abnormality identified. 2. Generalized age-related cerebral atrophy with moderate cerebral white matter disease, nonspecific, but most commonly related to chronic small vessel ischemic changes. MRA HEAD IMPRESSION Negative intracranial MRA. No large vessel occlusion. No high-grade or correctable stenosis identified. Electronically Signed   By: Jeannine Boga M.D.   On: 12/02/2017 00:36    Lab Data:  CBC: Recent Labs  Lab 11/26/17 1027 11/30/17 1550 11/30/17 1656 12/02/17 0704  WBC 5.4 6.8  --  10.2  NEUTROABS 3.1 3.4  --   --   HGB 12.0* 12.0* 12.9* 12.3*  HCT 36.5* 39.2 38.0* 39.4  MCV 88.3 92.5  --  90.2  PLT 285.0 276  --  169   Basic Metabolic Panel: Recent Labs  Lab 11/26/17 1027 11/30/17 1550 11/30/17 1656 12/02/17 0704  NA 138 139 140 138  K 4.5 3.6 3.6 3.8  CL 102 99* 99* 103  CO2 30 29  --  25  GLUCOSE 104* 93 90 152*  BUN 13 17 17 17   CREATININE 0.74 0.84 0.80 0.71  CALCIUM 9.6 9.4  --  9.7   GFR: Estimated Creatinine Clearance: 99.8 mL/min (by C-G formula based on SCr of 0.71 mg/dL). Liver Function Tests: Recent Labs  Lab 11/26/17 1027 11/30/17 1550 12/02/17 0704  AST 16 22 19   ALT 12 22 24   ALKPHOS 74 66 67  BILITOT 0.5 0.6 0.5  PROT 7.0 6.7 6.4*  ALBUMIN 4.0 3.9 3.6   No results for input(s): LIPASE, AMYLASE in the last 168 hours. No results for input(s): AMMONIA in the last 168 hours. Coagulation Profile: Recent Labs  Lab 11/30/17 1550  INR 0.99   Cardiac Enzymes: Recent Labs  Lab 12/02/17 0905  TROPONINI <0.03   BNP (last 3 results) No results for input(s): PROBNP in the last 8760 hours. HbA1C: Recent Labs    12/01/17 0512  HGBA1C 4.6*   CBG: No results for  input(s): GLUCAP in the last 168 hours. Lipid Profile: Recent Labs    12/01/17 0512  CHOL 182  HDL 48  LDLCALC 99  TRIG 174*  CHOLHDL 3.8   Thyroid Function Tests: No results for input(s): TSH, T4TOTAL, FREET4, T3FREE, THYROIDAB in the last 72 hours. Anemia Panel: No results for input(s): VITAMINB12, FOLATE, FERRITIN, TIBC, IRON, RETICCTPCT in the last 72 hours. Urine analysis:    Component Value Date/Time   COLORURINE YELLOW 11/30/2017 2024   APPEARANCEUR CLEAR 11/30/2017 2024   LABSPEC 1.011 11/30/2017 2024   PHURINE 6.0 11/30/2017 2024   GLUCOSEU NEGATIVE 11/30/2017 2024   GLUCOSEU NEGATIVE 03/01/2012 1029   HGBUR NEGATIVE 11/30/2017 2024   BILIRUBINUR NEGATIVE 11/30/2017 2024   BILIRUBINUR negative 06/11/2016 1106   [place] 11/30/2017 2024   PROTEINUR NEGATIVE 11/30/2017 2024   UROBILINOGEN negative 06/11/2016 1106   UROBILINOGEN 1.0 03/07/2014 0019   NITRITE NEGATIVE 11/30/2017 2024   LEUKOCYTESUR NEGATIVE 11/30/2017 2024     Chai Verdejo M.D. [facility] 12/02/2017, 1:04 PM  Pager: [phone] Between 7am to 7pm - call Pager - [phone]  After 7pm go to [email or web address] -  password [email or web address]  Call night coverage person covering after 7pm

## 2017-12-02 NOTE — Care Management CC44 (Signed)
Condition Code 44 Documentation Completed  Patient Details  Name: [name] MRN: [id number] Date of Birth: 1941-07-06   Condition Code 44 given:  Yes Patient signature on Condition Code 44 notice:  Yes Documentation of 2 MD's agreement:  Yes Code 44 added to claim:  Yes    Sharin Mons, RN 12/02/2017, 10:33 AM

## 2017-12-02 NOTE — Progress Notes (Signed)
MRI/MRA resulted:  MRI HEAD IMPRESSION 1. No acute intracranial abnormality identified. 2. Generalized age-related cerebral atrophy with moderate cerebral white matter disease, nonspecific, but most commonly related to chronic small vessel ischemic changes.  MRA HEAD IMPRESSION Negative intracranial MRA. No large vessel occlusion. No high-grade or correctable stenosis identified.  A/R: Waxing and waning dysphagia with dysarthria exacerbated by eating.  1. MRI reveals no stroke 2. Etiology given negative MRI is most likely structural 3. ENT consult is recommended to further evaluate  Neurology will sign off. Please call if there are additional questions.  Electronically signed: Dr. Kerney Elbe

## 2017-12-02 NOTE — Consult Note (Signed)
[name], [name] 77 y.o., male [id number]     Chief Complaint: speech and swallowing difficulty  HPI: 77 yo wm, retired [employer], had fairly sudden onset difficulty with speech and swallowing after an IV iron infusion 12 days ago.  Has had multiple prior iron infusions with no difficulty.  This episode the iron seemed to infuse more rapidly than usual.  Sl discomfort when food gets stuck. The problem seems to be located in tongue and pharynx.  With any eating, and with extended talking, his tongue will feel enlarged and clumsy.  No voice change or breathing difficulty.  No tremor.  No diplopia.   CT head, MRI head without abnormalities or any sign of CVA.  Ba swalllow essentially normal earlier today.  Speech Pathology witnessed attempts at breakfast with frank and prompt aspiration to solid foods.  No prior similar events.    PMH: Past Medical History:  Diagnosis Date  . Anemia   . Blood transfusion without reported diagnosis 02/23/2014   3 units for iron deficiency anemia. as a baby - whenhe had pneumonia  . BPH (benign prostatic hyperplasia)   . CTS (carpal tunnel syndrome)    better  . Dyspnea   . GERD (gastroesophageal reflux disease)   . Hyperlipidemia   . Hypertension    "THEY DIAGNOSED ME YEARS AGO BUT LATELY IVE ACTUALLY HAD LOW BLOOD PRESSURES "   . Insomnia   . LBP (low back pain)   . Osteoarthritis   . Pneumonia    as a baby  . Sleep apnea    no cpap    Surg Hx: Past Surgical History:  Procedure Laterality Date  . CARDIAC CATHETERIZATION N/A 08/15/2015   Procedure: Left Heart Cath and Coronary Angiography;  Surgeon: Lorretta Harp, MD;  Location: [facility];  Service: Cardiovascular;  Laterality: N/A;  . CARPAL TUNNEL RELEASE Bilateral 1996, 2004   Dr Lenoard Aden thumb surgery.  more than once  . COLONOSCOPY  2005, 2012   diverticulosis.   Marland Kitchen ESOPHAGOGASTRODUODENOSCOPY N/A 02/23/2014   Procedure: ESOPHAGOGASTRODUODENOSCOPY (EGD);  Surgeon: Irene Shipper, MD;  Location: [facility];  Service: Endoscopy;  Laterality: N/A;  . EYE SURGERY  2012   both cataracts, Lasik  . LEFT KNEE ARTHROSCOPY Left 07/16/2017    [facility]   . LUMBAR FUSION     x 3  . REVERSE SHOULDER ARTHROPLASTY Left 07/04/2016   Procedure: LEFT REVERSE SHOULDER ARTHROPLASTY;  Surgeon: Netta Cedars, MD;  Location: [facility];  Service: Orthopedics;  Laterality: Left;  . TONSILLECTOMY  1946  . TOTAL KNEE ARTHROPLASTY  2003   Right  . TOTAL KNEE ARTHROPLASTY Left 09/25/2017   Procedure: LEFT TOTAL KNEE ARTHROPLASTY;  Surgeon: Sydnee Cabal, MD;  Location: [facility];  Service: Orthopedics;  Laterality: Left;    FHx:   Family History  Problem Relation Age of Onset  . Aneurysm Father        AAA  . Heart disease Father 64       CHF  . Alzheimer's disease Father   . Cancer Brother   . Alzheimer's disease Brother        dementia  . Stomach cancer Brother   . Cancer Mother 17       breast cancer  . Heart disease Mother 50       MI  . Autoimmune disease Son   . CAD Brother   . Colon cancer Neg Hx   . Esophageal cancer Neg Hx   .  Pancreatic cancer Neg Hx   . Prostate cancer Neg Hx   . Rectal cancer Neg Hx    SocHx:  reports that he quit smoking about 30 years ago. He has never used smokeless tobacco. He reports that he drinks alcohol. He reports that he does not use drugs.  ALLERGIES: No Known Allergies  Medications Prior to Admission  Medication Sig Dispense Refill  . aspirin EC 81 MG tablet Take 1 tablet (81 mg total) by mouth daily. 100 tablet 3  . Cholecalciferol (VITAMIN D3) 2000 units TABS Take 2,000 Units by mouth at bedtime.    . Coenzyme Q10 (CO Q-10 PO) Take 1 tablet by mouth at bedtime.    . Cyanocobalamin (VITAMIN B-12) 1000 MCG SUBL Place 1 tablet (1,000 mcg total) under the tongue daily. (Patient taking differently: Place 1,000 mcg under the tongue at bedtime. ) 100 tablet 3  . DULoxetine (CYMBALTA) 60 MG capsule TAKE 1 CAPSULE BY MOUTH  DAILY 90  capsule 1  . finasteride (PROSCAR) 5 MG tablet TAKE 1 TABLET BY MOUTH  DAILY (Patient taking differently: TAKE 1 TABLET (5 MG) BY MOUTH  DAILY AT NIGHT) 90 tablet 3  . folic acid (V-R FOLIC ACID) 893 MCG tablet Take 1 tablet (400 mcg total) by mouth daily. (Patient taking differently: Take 400 mcg by mouth at bedtime. ) 90 tablet 3  . furosemide (LASIX) 20 MG tablet TAKE 1 TO 2 TABLETS BY  MOUTH DAILY AS NEEDED FOR  EDEMA (Patient taking differently: TAKE 1 TABLET (20 MG) BY  MOUTH DAILY IN THE EVENING) 180 tablet 3  . Polyethyl Glycol-Propyl Glycol (LUBRICANT EYE DROPS) 0.4-0.3 % SOLN Place 1-2 drops into both eyes daily.    . pravastatin (PRAVACHOL) 20 MG tablet Take 1 tablet (20 mg total) by mouth daily. (Patient taking differently: Take 20 mg by mouth at bedtime. ) 90 tablet 3  . predniSONE (DELTASONE) 20 MG tablet Take 2 tablets (40 mg total) by mouth daily with breakfast. 10 tablet 0  . ranitidine (ZANTAC) 150 MG tablet Take 1 tablet (150 mg total) by mouth 2 (two) times daily. (Patient taking differently: Take 150 mg by mouth at bedtime. ) 180 tablet 3  . tamsulosin (FLOMAX) 0.4 MG CAPS capsule TAKE 2 CAPSULES BY MOUTH  DAILY 180 capsule 3  . vitamin C (ASCORBIC ACID) 500 MG tablet Take 1 tablet (500 mg total) by mouth daily. (Patient taking differently: Take 500 mg by mouth at bedtime. ) 100 tablet 1    Results for orders placed or performed during the hospital encounter of 11/30/17 (from the past 48 hour(s))  Urine rapid drug screen (hosp performed)     Status: None   Collection Time: 11/30/17  8:24 PM  Result Value Ref Range   Opiates NONE DETECTED NONE DETECTED   Cocaine NONE DETECTED NONE DETECTED   Benzodiazepines NONE DETECTED NONE DETECTED   Amphetamines NONE DETECTED NONE DETECTED   Tetrahydrocannabinol NONE DETECTED NONE DETECTED   Barbiturates NONE DETECTED NONE DETECTED    Comment: (NOTE) DRUG SCREEN FOR MEDICAL PURPOSES ONLY.  IF CONFIRMATION IS NEEDED FOR ANY PURPOSE,  NOTIFY LAB WITHIN 5 DAYS. LOWEST DETECTABLE LIMITS FOR URINE DRUG SCREEN Drug Class                     Cutoff (ng/mL) Amphetamine and metabolites    1000 Barbiturate and metabolites    200 Benzodiazepine  836 Tricyclics and metabolites     300 Opiates and metabolites        300 Cocaine and metabolites        300 THC                            50 Performed at [facility], [place] [address], [place], [place] [address]   Urinalysis, Routine w reflex microscopic     Status: None   Collection Time: 11/30/17  8:24 PM  Result Value Ref Range   Color, Urine YELLOW YELLOW   APPearance CLEAR CLEAR   Specific Gravity, Urine 1.011 1.005 - 1.030   pH 6.0 5.0 - 8.0   Glucose, UA NEGATIVE NEGATIVE mg/dL   Hgb urine dipstick NEGATIVE NEGATIVE   Bilirubin Urine NEGATIVE NEGATIVE   Ketones, ur NEGATIVE NEGATIVE mg/dL   Protein, ur NEGATIVE NEGATIVE mg/dL   Nitrite NEGATIVE NEGATIVE   Leukocytes, UA NEGATIVE NEGATIVE    Comment: Performed at [facility] [address] [place], [place] [address]  Hemoglobin A1c     Status: Abnormal   Collection Time: 12/01/17  5:12 AM  Result Value Ref Range   Hgb A1c MFr Bld 4.6 (L) 4.8 - 5.6 %    Comment: (NOTE) Pre diabetes:          5.7%-6.4% Diabetes:              >6.4% Glycemic control for   <7.0% adults with diabetes    Mean Plasma Glucose 85.32 mg/dL    Comment: Performed at [facility] [address], [place],  [address]  Lipid panel     Status: Abnormal   Collection Time: 12/01/17  5:12 AM  Result Value Ref Range   Cholesterol 182 0 - 200 mg/dL   Triglycerides 174 (H) <150 mg/dL   HDL 48 >40 mg/dL   Total CHOL/HDL Ratio 3.8 RATIO   VLDL 35 0 - 40 mg/dL   LDL Cholesterol 99 0 - 99 mg/dL    Comment:        Total Cholesterol/HDL:CHD Risk Coronary Heart Disease Risk Table                     Men   Women  1/2 Average Risk   3.4   3.3  Average Risk       5.0   4.4  2 X  Average Risk   9.6   7.1  3 X Average Risk  23.4   11.0        Use the calculated Patient Ratio above and the CHD Risk Table to determine the patient's CHD Risk.        ATP III CLASSIFICATION (LDL):  <100     mg/dL   Optimal  100-129  mg/dL   Near or Above                    Optimal  130-159  mg/dL   Borderline  160-189  mg/dL   High  >190     mg/dL   Very High Performed at [facility] [address], [place] [address]   CBC     Status: Abnormal   Collection Time: 12/02/17  7:04 AM  Result Value Ref Range   WBC 10.2 4.0 - 10.5 K/uL   RBC 4.37 4.22 - 5.81 MIL/uL   Hemoglobin 12.3 (L) 13.0 -  17.0 g/dL   HCT 39.4 39.0 - 52.0 %   MCV 90.2 78.0 - 100.0 fL   MCH 28.1 26.0 - 34.0 pg   MCHC 31.2 30.0 - 36.0 g/dL   RDW 16.8 (H) 11.5 - 15.5 %   Platelets 295 150 - 400 K/uL    Comment: Performed at [facility] [address], [place], [place] [address]  Comprehensive metabolic panel     Status: Abnormal   Collection Time: 12/02/17  7:04 AM  Result Value Ref Range   Sodium 138 135 - 145 mmol/L   Potassium 3.8 3.5 - 5.1 mmol/L   Chloride 103 101 - 111 mmol/L   CO2 25 22 - 32 mmol/L   Glucose, Bld 152 (H) 65 - 99 mg/dL   BUN 17 6 - 20 mg/dL   Creatinine, Ser 0.71 0.61 - 1.24 mg/dL   Calcium 9.7 8.9 - 10.3 mg/dL   Total Protein 6.4 (L) 6.5 - 8.1 g/dL   Albumin 3.6 3.5 - 5.0 g/dL   AST 19 15 - 41 U/L   ALT 24 17 - 63 U/L   Alkaline Phosphatase 67 38 - 126 U/L   Total Bilirubin 0.5 0.3 - 1.2 mg/dL   GFR calc non Af Amer >60 >60 mL/min   GFR calc Af Amer >60 >60 mL/min    Comment: (NOTE) The eGFR has been calculated using the CKD EPI equation. This calculation has not been validated in all clinical situations. eGFR's persistently <60 mL/min signify possible Chronic Kidney Disease.    Anion gap 10 5 - 15    Comment: Performed at [facility] [address] [place], [place] [address]  D-dimer, quantitative (not at [facility])     Status: Abnormal    Collection Time: 12/02/17  9:05 AM  Result Value Ref Range   D-Dimer, Quant 0.67 (H) 0.00 - 0.50 ug/mL-FEU    Comment: (NOTE) At the manufacturer cut-off of 0.50 ug/mL FEU, this assay has been documented to exclude PE with a sensitivity and negative predictive value of 97 to 99%.  At this time, this assay has not been approved by the FDA to exclude DVT/VTE. Results should be correlated with clinical presentation. Performed at [facility], [place] [address] [place], [place] [address]   Troponin I     Status: None   Collection Time: 12/02/17  9:05 AM  Result Value Ref Range   Troponin I <0.03 <0.03 ng/mL    Comment: Performed at [facility] [address] [place], [place] [address]   Ct Angio Chest Pe W Or Wo Contrast  Result Date: 12/02/2017 CLINICAL DATA:  PE suspected, intermediate probability, positive D-dimer. Difficulty breathing when laying flat EXAM: CT ANGIOGRAPHY CHEST WITH CONTRAST TECHNIQUE: Multidetector CT imaging of the chest was performed using the standard protocol during bolus administration of intravenous contrast. Multiplanar CT image reconstructions and MIPs were obtained to evaluate the vascular anatomy. CONTRAST:  19m ISOVUE-370 IOPAMIDOL (ISOVUE-370) INJECTION 76% COMPARISON:  Abdominal CT 03/06/2014. FINDINGS: Cardiovascular: Satisfactory opacification of the pulmonary arteries to the segmental level. No evidence of pulmonary embolism. Normal heart size. No pericardial effusion. Diffuse coronary atherosclerotic calcification. Aortic atherosclerotic calcification is mild. Mediastinum/Nodes: Negative for adenopathy. Lungs/Pleura: Chronic elevation the left diaphragm with overlying left lower lobe multi segment collapse. Mild atelectasis at the right base. There is no edema, consolidation, effusion, or pneumothorax. Upper Abdomen: Barium in the stomach from recent esophagram. Small left renal cyst. Musculoskeletal: Extensive spondylosis with  multilevel thoracic  ankylosis. Advanced cervical spine spondylosis and disc degeneration where seen. Left glenohumeral arthroplasty. Right glenohumeral osteoarthritis. No acute or aggressive finding. Review of the MIP images confirms the above findings. IMPRESSION: 1. Negative for pulmonary embolism or other acute finding. 2. Chronic elevation of the left diaphragm. There is multi segment left lower lobe collapse than is greater than seen on 2015 abdominal CT. Mild atelectasis at the right base. 3.  Aortic Atherosclerosis (ICD10-I70.0).  Coronary atherosclerosis. 4. Spondylosis with multilevel thoracic ankylosis. Electronically Signed   By: Monte Fantasia M.D.   On: 12/02/2017 14:56   Mr Jeri Cos FW Contrast  Result Date: 12/02/2017 CLINICAL DATA:  Initial evaluation for acute ataxia. EXAM: MRI HEAD WITHOUT AND WITH CONTRAST MRA HEAD WITHOUT CONTRAST TECHNIQUE: Multiplanar, multiecho pulse sequences of the brain and surrounding structures were obtained without and with intravenous contrast. Angiographic images of the head were obtained using MRA technique without contrast. CONTRAST:  72m MULTIHANCE GADOBENATE DIMEGLUMINE 529 MG/ML IV SOLN COMPARISON:  Prior CT from 11/30/2017. FINDINGS: MRI HEAD FINDINGS Brain: Study mildly degraded by motion artifact. Diffuse prominence of the CSF containing spaces is compatible with generalized age-related cerebral atrophy. Patchy and confluent T2/FLAIR hyperintensity within the periventricular and deep white matter both cerebral hemispheres most consistent with chronic small vessel ischemic disease, moderate in nature. No abnormal foci of restricted diffusion to suggest acute or subacute ischemia. Gray-white matter differentiation maintained. No encephalomalacia to suggest chronic infarction. No foci of susceptibility artifact to suggest acute or chronic intracranial hemorrhage. No mass lesion, midline shift or mass effect. Ventricles normal size without hydrocephalus. No extra-axial fluid  collection. Major dural sinuses grossly patent. No abnormal enhancement. Pituitary gland suprasellar region normal. Midline structures intact and normal. Vascular: Major intracranial vascular flow voids are maintained. Skull and upper cervical spine: Craniocervical junction within normal limits. Upper cervical spine normal. Bone marrow signal intensity mildly decreased on T1 weighted imaging, suspected be related to patient's history of anemia. No discrete osseous lesions. Scalp soft tissues within normal limits. Sinuses/Orbits: Globes normal soft tissues within normal limits. Patient status post cataract extraction bilaterally. Few small retention cysts noted within left maxillary sinus. Paranasal sinuses are otherwise clear. No mastoid effusion. Inner ear structures normal. Other: None. MRA HEAD FINDINGS ANTERIOR CIRCULATION: Distal cervical segments of the internal carotid arteries are patent with antegrade flow. Petrous, cavernous, and supraclinoid segments patent without flow-limiting stenosis. Ophthalmic arteries patent proximally. ICA termini widely patent. A1 segments widely patent. Anterior communicating artery not well delineated. Anterior cerebral arteries widely patent to their distal aspects without stenosis. M1 segments patent without stenosis or occlusion. No proximal M2 occlusion. Distal MCA branches well perfused and symmetric. POSTERIOR CIRCULATION: Dominant right vertebral artery patent to the vertebrobasilar junction without stenosis. Partially visualized left vertebral artery hypoplastic and largely terminates in PICA. Posterior inferior cerebral arteries themselves are patent proximally. Basilar artery widely patent to its distal aspect. Superior cerebral arteries patent bilaterally. Both of the posterior cerebral artery supplied via the basilar and are well perfused to their distal aspects without stenosis. No intracranial aneurysm. IMPRESSION: MRI HEAD IMPRESSION 1. No acute intracranial  abnormality identified. 2. Generalized age-related cerebral atrophy with moderate cerebral white matter disease, nonspecific, but most commonly related to chronic small vessel ischemic changes. MRA HEAD IMPRESSION Negative intracranial MRA. No large vessel occlusion. No high-grade or correctable stenosis identified. Electronically Signed   By: BJeannine BogaM.D.   On: 12/02/2017 00:36   Dg Esophagus  Result Date: 12/02/2017 CLINICAL DATA:  Dysphagia with purees during bedside swallow study this morning. EXAM: ESOPHOGRAM / BARIUM SWALLOW / BARIUM TABLET STUDY TECHNIQUE: Combined double contrast and single contrast examination performed using effervescent crystals, thick barium liquid, and thin barium liquid. The patient was observed with fluoroscopy swallowing a 13 mm barium sulphate tablet. FLUOROSCOPY TIME:  Fluoroscopy Time:  3 minutes, 6 seconds. Radiation Exposure Index (if provided by the fluoroscopic device): 47.5 mGy. Number of Acquired Spot Images: 0 COMPARISON:  None. FINDINGS: The pharyngeal phase of swallowing demonstrated small amount of aspiration with thick barium. Mild tertiary contractions in the mid to distal esophagus. No obstruction to the forward flow of contrast throughout the esophagus and into the stomach. Normal esophageal course and contour. Normal esophageal mucosal pattern. No esophageal stricture, ulceration, or other significant abnormality. No hiatal hernia. Mild gastroesophageal reflux into the distal esophagus was elicited using the water siphon test. A 13 mm barium tablet passed without difficulty into the stomach. IMPRESSION: 1. Small aspiration with thick barium. 2. Mild nonspecific esophageal dysmotility. 3. Mild gastroesophageal reflux. Electronically Signed   By: Titus Dubin M.D.   On: 12/02/2017 12:10   Mr Jodene Nam Head Wo Contrast  Result Date: 12/02/2017 CLINICAL DATA:  Initial evaluation for acute ataxia. EXAM: MRI HEAD WITHOUT AND WITH CONTRAST MRA HEAD  WITHOUT CONTRAST TECHNIQUE: Multiplanar, multiecho pulse sequences of the brain and surrounding structures were obtained without and with intravenous contrast. Angiographic images of the head were obtained using MRA technique without contrast. CONTRAST:  66m MULTIHANCE GADOBENATE DIMEGLUMINE 529 MG/ML IV SOLN COMPARISON:  Prior CT from 11/30/2017. FINDINGS: MRI HEAD FINDINGS Brain: Study mildly degraded by motion artifact. Diffuse prominence of the CSF containing spaces is compatible with generalized age-related cerebral atrophy. Patchy and confluent T2/FLAIR hyperintensity within the periventricular and deep white matter both cerebral hemispheres most consistent with chronic small vessel ischemic disease, moderate in nature. No abnormal foci of restricted diffusion to suggest acute or subacute ischemia. Gray-white matter differentiation maintained. No encephalomalacia to suggest chronic infarction. No foci of susceptibility artifact to suggest acute or chronic intracranial hemorrhage. No mass lesion, midline shift or mass effect. Ventricles normal size without hydrocephalus. No extra-axial fluid collection. Major dural sinuses grossly patent. No abnormal enhancement. Pituitary gland suprasellar region normal. Midline structures intact and normal. Vascular: Major intracranial vascular flow voids are maintained. Skull and upper cervical spine: Craniocervical junction within normal limits. Upper cervical spine normal. Bone marrow signal intensity mildly decreased on T1 weighted imaging, suspected be related to patient's history of anemia. No discrete osseous lesions. Scalp soft tissues within normal limits. Sinuses/Orbits: Globes normal soft tissues within normal limits. Patient status post cataract extraction bilaterally. Few small retention cysts noted within left maxillary sinus. Paranasal sinuses are otherwise clear. No mastoid effusion. Inner ear structures normal. Other: None. MRA HEAD FINDINGS ANTERIOR  CIRCULATION: Distal cervical segments of the internal carotid arteries are patent with antegrade flow. Petrous, cavernous, and supraclinoid segments patent without flow-limiting stenosis. Ophthalmic arteries patent proximally. ICA termini widely patent. A1 segments widely patent. Anterior communicating artery not well delineated. Anterior cerebral arteries widely patent to their distal aspects without stenosis. M1 segments patent without stenosis or occlusion. No proximal M2 occlusion. Distal MCA branches well perfused and symmetric. POSTERIOR CIRCULATION: Dominant right vertebral artery patent to the vertebrobasilar junction without stenosis. Partially visualized left vertebral artery hypoplastic and largely terminates in PICA. Posterior inferior cerebral arteries themselves are patent proximally. Basilar artery widely patent to its distal aspect. Superior cerebral arteries patent bilaterally. Both of the  posterior cerebral artery supplied via the basilar and are well perfused to their distal aspects without stenosis. No intracranial aneurysm. IMPRESSION: MRI HEAD IMPRESSION 1. No acute intracranial abnormality identified. 2. Generalized age-related cerebral atrophy with moderate cerebral white matter disease, nonspecific, but most commonly related to chronic small vessel ischemic changes. MRA HEAD IMPRESSION Negative intracranial MRA. No large vessel occlusion. No high-grade or correctable stenosis identified. Electronically Signed   By: Jeannine Boga M.D.   On: 12/02/2017 00:36      Blood pressure 130/66, pulse 64, temperature 97.9 F (36.6 C), temperature source Oral, resp. rate 16, height 6' (1.829 m), weight 108 kg (238 lb), SpO2 94 %.  PHYSICAL EXAM: Overall appearance:  Stocky and healthy.  Tongue does seem to be variably dysarthric during the course of our interview.  Voice is phonatory and clear. Head:  NCAT Ears:  Sl waxy Nose: clear Oral Cavity: moist.  Full upper plate.  No  fasciculations of tongue.  Tongue is soft and without dental impressions.   Oral Pharynx/Hypopharynx/Larynx:  Per flexible laryngoscope, NP clear.  OP clear.  HP/Larynx with some post cricoid swelling.  No pooling in valleculae or pyriforms. Neuro:  Cranial nerves intact. Neck:  No nodes.  No thyromegaly.  Studies Reviewed:  CT head,  Ba swallow as above    Assessment/Plan Rapidly progressive and relaxing tongue dysarthria.  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  Not amyloid.  No evidence of acute neurologic injury.    Plan:  I discussed with pt and wife.  I would ask Neurology to reconsider, and maybe Speech Path also.  Perhaps a CT neck with contrast.    Erik Obey, Etan Vasudevan 2/32/0094, 7:10 PM

## 2017-12-02 NOTE — Care Management Obs Status (Signed)
[facility] NOTIFICATION   Patient Details  Name: [name] MRN: [id number] Date of Birth: [birth year]   Medicare Observation Status Notification Given:       Sharin Mons, RN 12/02/2017, 10:32 AM

## 2017-12-02 NOTE — Progress Notes (Signed)
PT Cancellation Note  Patient Details Name: [name] MRN: [id number] DOB: 03-Dec-1940   Cancelled Treatment:    Reason Eval/Treat Not Completed: PT screened, no needs identified, will sign off Pt evaluated yesterday and functioning at an independent to mod I level with mobility. Noted new orders and spoke with RN and OT, and no new mobility deficits noted. Will sign off. If needs change, please reconsult.   Leighton Ruff, PT, DPT  Acute Rehabilitation Services  Pager: [phone]  Rudean Hitt 12/02/2017, 4:01 PM

## 2017-12-02 NOTE — Progress Notes (Signed)
OT Cancellation Note  Patient Details Name: [name] MRN: [id number] DOB: 10-13-1940   Cancelled Treatment:    Reason Eval/Treat Not Completed: Patient at procedure or test/ unavailable. Pt leaving unit for testing on my arrival. Will check back as able to initiate evaluation.   Norman Herrlich, MS OTR/L  Pager: [place] A Daquan Crapps 12/02/2017, 11:02 AM

## 2017-12-02 NOTE — Progress Notes (Signed)
Patient became very confused after awakening from sleep. Alert and disoriented x 4, removing IV and telemetry.  Patient was redirectable and eventually became oriented to self.  NT called wife for patient; however patient did not recognize/realize he had a wife. Patient agreed to remain in bed for safety reasons and will cooperate with staff.  Will continue to monitor.

## 2017-12-02 NOTE — Evaluation (Addendum)
Occupational Therapy Evaluation and Discharge Patient Details Name: [name] MRN: [id number] DOB: [birth year] Today's Date: 12/02/2017    History of Present Illness Pt is a 77 y.o. male who presented to the ED one week history of slurring of speech and dysphagia which worsened during the 2 days prior to admission. Admitted 11/30/17 for stroke work-up. MRI negative for CVA. PMH significant for CAD, chronic anemia, iron deficiency, bilateral TKA with most recent knee replacement surgery in February 2019, had iron transfusion on 3/29.   Clinical Impression   PTA, pt was independent with ADL and functional mobility. He lives with his wife. Pt presents to OT session with difficulty with expressive communication and reports feeling as though his "tongue is swollen." He is able to demonstrate appropriate problem solving skills, divided attention in moderately distracting hallway environment, and overall functional cognition for ADL participation. Pt demonstrating modified independence with ADL participation throughout session today. Educated pt concerning safety precautions during ADL and functional mobility and he verbalizes understanding. At this time, no further acute OT needs identified. OT will sign off. Recommend continued speech therapy for oral motor deficits.     Follow Up Recommendations  No OT follow up;Supervision/Assistance - 24 hour(initial)    Equipment Recommendations  None recommended by OT    Recommendations for Other Services Speech consult     Precautions / Restrictions Precautions Precautions: Fall Restrictions Weight Bearing Restrictions: No      Mobility Bed Mobility Overal bed mobility: Independent                Transfers Overall transfer level: Independent                    Balance                                           ADL either performed or assessed with clinical judgement   ADL Overall ADL's : At  baseline;Modified independent                                       General ADL Comments: Increased time but no physical assistance.     Vision Patient Visual Report: No change from baseline Vision Assessment?: No apparent visual deficits Additional Comments: No apparent visual deficits noted today.      Perception     Praxis      Pertinent Vitals/Pain Pain Assessment: No/denies pain     Hand Dominance     Extremity/Trunk Assessment Upper Extremity Assessment Upper Extremity Assessment: Overall WFL for tasks assessed   Lower Extremity Assessment Lower Extremity Assessment: Overall WFL for tasks assessed       Communication Communication Communication: Expressive difficulties(slurred speech; reports "my tongue feels swollen")   Cognition Arousal/Alertness: Awake/alert Behavior During Therapy: WFL for tasks assessed/performed Overall Cognitive Status: Within Functional Limits for tasks assessed                                 General Comments: Able to dual-task in hallway, complete functional reasoning tasks, and complete serial 7's in moderately distracting environments.    General Comments  Pt with expressive difficulty due to feeling like his "tongue was swollen."    Exercises  Shoulder Instructions      Home Living Family/patient expects to be discharged to:: Private residence Living Arrangements: Spouse/significant other Available Help at Discharge: Family;Friend(s);Available 24 hours/day Type of Home: Other(Comment) Home Access: Level entry     Home Layout: Two level;Able to live on main level with bedroom/bathroom     Bathroom Shower/Tub: Walk-in shower   Bathroom Toilet: Handicapped height     Home Equipment: Environmental consultant - 2 wheels;Cane - single point;Grab bars - tub/shower;Shower seat          Prior Functioning/Environment Level of Independence: Independent                 OT Problem List: Decreased  activity tolerance      OT Treatment/Interventions:      OT Goals(Current goals can be found in the care plan section) Acute Rehab OT Goals Patient Stated Goal: hoping to go to [place] in 2 weeks OT Goal Formulation: With patient  OT Frequency:     Barriers to D/C:            Co-evaluation              AM-PAC PT "6 Clicks" Daily Activity     Outcome Measure Help from another person eating meals?: None Help from another person taking care of personal grooming?: None Help from another person toileting, which includes using toliet, bedpan, or urinal?: None Help from another person bathing (including washing, rinsing, drying)?: None Help from another person to put on and taking off regular upper body clothing?: None Help from another person to put on and taking off regular lower body clothing?: None 6 Click Score: 24   End of Session Equipment Utilized During Treatment: Gait belt Nurse Communication: Other (comment)(nurse tech - mobility)  Activity Tolerance: Patient tolerated treatment well Patient left: in chair;with call bell/phone within reach;with family/visitor present  OT Visit Diagnosis: Other abnormalities of gait and mobility (R26.89)                Time: 0165-5374 OT Time Calculation (min): 17 min Charges:  OT General Charges $OT Visit: 1 Visit OT Evaluation $OT Eval Moderate Complexity: 1 Mod G-Codes:     Norman Herrlich, MS OTR/L  Pager: [place] A Mada[place] 12/02/2017, 4:05 PM

## 2017-12-02 NOTE — Progress Notes (Signed)
  Speech Language Pathology Treatment: Dysphagia  Patient Details Name: [name] MRN: [id number] DOB: 1940-09-13 Today's Date: 12/02/2017 Time: 4854-6270 SLP Time Calculation (min) (ACUTE ONLY): 17 min  Assessment / Plan / Recommendation Clinical Impression  Skilled treatment session focused on dysphagia goals. SLP alerted to choking episode this morning, MD order for full liquid diet, order for esophagram and request for SLP to evaluate at bedside. Nursing and family present during treatment. SLP facilitated treatment by providing skilled observation of cup sips of thin liquids without overt s/s of aspiration. However when administered small bolus (1/2 tsp) of applesauce, pt with mild choking, globus sensation, gagging attempts to bring bolus back up, gurgling and coughing. Max effort required by pt. Further trials of thin liquids via cup yielded multiple swallows, wet voice, gulping (appeared to help with passage of bolus). Recommend NPO given s/s at bedside. Education provided to MD, will await results of esophagram, then MD requests ENT consult before possible MBS.    HPI HPI: 77 year old male admitted 11/30/17 with stroke like symptoms, orofacial swelling. PMH significant for GERD. Head CT and CXR negative, MRI pending.      SLP Plan  Continue with current plan of care       Recommendations  Diet recommendations: NPO Medication Administration: Via alternative means                Oral Care Recommendations: Oral care QID Follow up Recommendations: (TBD) SLP Visit Diagnosis: Dysphagia, unspecified (R13.10) Plan: Continue with current plan of care       GO                Kemani Demarais 12/02/2017, 10:23 AM

## 2017-12-03 ENCOUNTER — Observation Stay (HOSPITAL_COMMUNITY): Payer: Medicare Other

## 2017-12-03 DIAGNOSIS — R1310 Dysphagia, unspecified: Secondary | ICD-10-CM | POA: Diagnosis not present

## 2017-12-03 DIAGNOSIS — R7989 Other specified abnormal findings of blood chemistry: Secondary | ICD-10-CM | POA: Diagnosis not present

## 2017-12-03 LAB — VITAMIN B12: Vitamin B-12: 1079 pg/mL — ABNORMAL HIGH (ref 180–914)

## 2017-12-03 LAB — CK: Total CK: 146 U/L (ref 49–397)

## 2017-12-03 LAB — TSH: TSH: 0.249 u[IU]/mL — ABNORMAL LOW (ref 0.350–4.500)

## 2017-12-03 LAB — FOLATE: Folate: 20 ng/mL (ref >5.9)

## 2017-12-03 LAB — T4, FREE: Free T4: 1.27 ng/dL — ABNORMAL HIGH (ref 0.61–1.12)

## 2017-12-03 MED ORDER — DULOXETINE HCL 60 MG PO CPEP
60.0000 mg | ORAL_CAPSULE | Freq: Every day | ORAL | Status: DC
  Administered 2017-12-05: 60 mg via ORAL
  Filled 2017-12-03 (×2): qty 1

## 2017-12-03 MED ORDER — BOOST / RESOURCE BREEZE PO LIQD CUSTOM
1.0000 | Freq: Three times a day (TID) | ORAL | Status: DC
  Administered 2017-12-03 – 2017-12-04 (×5): 1 via ORAL
  Filled 2017-12-03: qty 1

## 2017-12-03 MED ORDER — LORAZEPAM 2 MG/ML IJ SOLN
0.5000 mg | Freq: Four times a day (QID) | INTRAMUSCULAR | Status: DC | PRN
  Administered 2017-12-03: 0.5 mg via INTRAVENOUS
  Filled 2017-12-03 (×2): qty 1

## 2017-12-03 MED ORDER — IOPAMIDOL (ISOVUE-300) INJECTION 61%
100.0000 mL | Freq: Once | INTRAVENOUS | Status: AC
  Administered 2017-12-03: 75 mL via INTRAVENOUS

## 2017-12-03 MED ORDER — HALOPERIDOL LACTATE 5 MG/ML IJ SOLN
INTRAMUSCULAR | Status: AC
  Filled 2017-12-03: qty 1

## 2017-12-03 MED ORDER — IOPAMIDOL (ISOVUE-300) INJECTION 61%
INTRAVENOUS | Status: AC
  Filled 2017-12-03: qty 100

## 2017-12-03 NOTE — Progress Notes (Signed)
Initial Nutrition Assessment  DOCUMENTATION CODES:   Not applicable  INTERVENTION:  Continue Boost Breeze po TID, each supplement provides 250 kcal and 9 grams of protein per discussion with speech pathologist. She is concerned with patient's ability to tolerate a thicker consistency such as ensure  Monitor for diet advancement and tolerance. May need to consider alternative forms of nutrition if he is unable to tolerate PO consistently.  NUTRITION DIAGNOSIS:   Swallowing difficulty related to dysphagia as evidenced by other (comment)(discussion with speech pathlogist).  GOAL:   Patient will meet greater than or equal to 90% of their needs  MONITOR:   PO intake, Supplement acceptance, Diet advancement  REASON FOR ASSESSMENT:   Consult Assessment of nutrition requirement/status  ASSESSMENT:   [name] is a 77 yo male with CAD, chronic anemia, iron deficiency with iron infusions every 3 months, L knee replacement 09/2017.   Patient presented to ED with dysphagia and dysarthria that started evening of 3/29, felt he was choking and his speech was slurred.  Family thought it could be due to dental problem and went to the dentist on 4/1, had the partial plate fixed with improvement and resolution of symptoms.  Patient also felt swelling on the right side of his face.  However subsequently, on 4/3 patient's symptoms restarted and he continued to choke and had slurred speech.  Patient was evaluated by PCP who started the patient on steroids as possibly an allergic reaction to the iron without significant improvement.  Patient presented to ED, initially thought to have possible CVA, evaluated by neurology.  MRI however negative for CVA.   Spoke with patient at request of speech pathologist HE has been struggling with dysphagia and dysarthria since having an Iron transfusion. His mouth would swell and then return to normal size, this continued to happen with PO intake. Per friend [name] at  bedside this is the best patient has looked since he has been visiting him with less swelling, and dysarthria. He had no oral issues 4/9 but aspirated with all consistencies 4/10, now back to drinking full liquids during visit with no issues. He states he has not had a solid meal in 2-3 days. States he has lost 15 pounds over the past 2-3 months due to him eating less. He states he still eats 3 meals a day, good PO intake but friend [name] indicates patient has just "not been as hungry." States UBW of 238 pounds. Appears he has lost about 18 pounds/7.3% of total body weight over the past 2 months, significant for timeframe.  Labs reviewed Medications reviewed and include:  Solumedrol, Protonix  NUTRITION - FOCUSED PHYSICAL EXAM:    Most Recent Value  Orbital Region  No depletion  Upper Arm Region  No depletion  Thoracic and Lumbar Region  No depletion  Buccal Region  No depletion  Temple Region  Mild depletion  Clavicle Bone Region  No depletion  Clavicle and Acromion Bone Region  No depletion  Scapular Bone Region  No depletion  Dorsal Hand  No depletion  Patellar Region  No depletion  Anterior Thigh Region  No depletion  Posterior Calf Region  No depletion       Diet Order:  Fall precautions Diet full liquid Room service appropriate? Yes; Fluid consistency: Thin  EDUCATION NEEDS:   Not appropriate for education at this time  Skin:  Skin Assessment: Reviewed RN Assessment  Last BM:  11/30/2017  Height:   Ht Readings from Last 1 Encounters:  11/30/17  6' (1.829 m)    Weight:   Wt Readings from Last 1 Encounters:  12/03/17 227 lb 4.7 oz (103.1 kg)    Ideal Body Weight:  80.9 kg  BMI:  Body mass index is 30.83 kg/m.  Estimated Nutritional Needs:   Kcal:  2100-2500 calories (MSJ x1.2-1.4)  Protein:  103-124 grams (1.2-1.4g/kg)  Fluid:  2.1-2.5L  Satira Anis. Aspin Palomarez, MS, RD LDN Inpatient Clinical Dietitian Pager [phone]

## 2017-12-03 NOTE — Progress Notes (Signed)
At 0200am patient was getting out of bed stating he couldn't breath because someone was pushing his shoulders down too far in the bed, care nurse offered to help patient out of bed into recliner patient became irate stating "I dont need any help" and snatching at iv tape. Patient assisted out of bed, to recliner, iv was retaped. Patient appeared more comfortable. At [employer] Patient stated "I am leaving and you better get out of my way or I will hurt you." while standing from recliner and pulling at piv. Piv fluids disconnected by care nurse and patient then ambulated down hallway, security was notified and MD paged. Staff accompanied patient down hall and attempted to calm patient at this point patient is disoriented to self, speech slurred, stating "take me to jail, call the police, I want to leave" and attempted to leave via the fire exit at end of hallway. Staff was able to assist patient in to chair at end of hallway while security came and talked to patient. Security escorted patient back to room and patient removed piv. Wife was called and notified of patient activities and spoke to patient over the phone. Patient seemed calmer after police esorted him back to room and spoke to wife. Given IM haldol 3mg  per order, tolerated well. Patient stated he will not cause any more trouble tonight and sat in recliner for fear of difficulty breathing while in bed.

## 2017-12-03 NOTE — Progress Notes (Signed)
  Speech Language Pathology Treatment: Dysphagia  Patient Details Name: [name] MRN: [id number] DOB: 30-Sep-1940 Today's Date: 12/03/2017 Time: 4098-1191 SLP Time Calculation (min) (ACUTE ONLY): 14 min  Assessment / Plan / Recommendation Clinical Impression  Purpose of visit to educate pt/wife/friend to findings of MBS , clinical reasoning for diet modification/swallow strategies. Showed pt/family regular MBS study versus his study.   Provided pt with Boost Breeze *berry* which he tolerated well. Reinforced compensation strategies of multiple swallows per bolus and cough/expectorate with mod Cues to conduct.     Pt's wife reports that as soon as he gets something in his mouth, his speech becomes less clear.  SLP observed pt to become dysarthric after small amount of liquid intake.  Given concern for possible myasthenia gravis, advised him to monitor for fatigue closely and cease intake if noted.    Pt also provided with tissues and emesis bag to use for expectoration.  Of note, pt did recall peach provided in MBS that did not clear and he expectorated.  Discussed level of dysphagia concerning for ability to meet nutritional needs and ideally pt will consume small amounts of intake throughout the day.  Will follow up for dysphagia management.     HPI HPI: 77 year old male admitted 11/30/17 with stroke like symptoms, orofacial swelling. PMH significant for GERD. Head CT and CXR negative, MRI negative.  ENT saw pt and documents concern for possible heavy metal exposure or myasthenia gravis.  Pt reports sudden onset of dysphagia and tongue feeling "swollen" - he states today is better than yesterday.  Esophagram during this hospital admit showed mild dysmotility, small amount of aspiration.  Pt has been NPO due to level of dysphagia.        SLP Plan  Continue with current plan of care       Recommendations  Diet recommendations: Thin liquid(full liquids) Liquids provided via:  Straw;Cup Medication Administration: Via alternative means(or liquid) Supervision: Patient able to self feed Compensations: Slow rate;Small sips/bites;Multiple dry swallows after each bite/sip;Other (Comment);Effortful swallow(cough and hock to clear residuals, consume small frequent meals) Postural Changes and/or Swallow Maneuvers: Seated upright 90 degrees;Upright 30-60 min after meal                Oral Care Recommendations: Oral care QID Follow up Recommendations: Other (comment);Outpatient SLP(tbd) SLP Visit Diagnosis: Dysphagia, oropharyngeal phase (R13.12);Dysphagia, pharyngoesophageal phase (R13.14) Plan: Continue with current plan of care       GO                 [name], [name] [facility] SLP [phone]  [name] 12/03/2017, 11:20 AM

## 2017-12-03 NOTE — Plan of Care (Signed)
  Problem: Health Behavior/Discharge Planning: Goal: Ability to manage health-related needs will improve Outcome: Progressing   

## 2017-12-03 NOTE — Progress Notes (Signed)
Modified Barium Swallow Progress Note  Patient Details  Name: JAMERSON VONBARGEN MRN: [id number] Date of Birth: 12-22-1940  Today's Date: 12/03/2017  Modified Barium Swallow completed.  Full report located under Chart Review in the Imaging Section.  Brief recommendations include the following:  Clinical Impression  Mild oral and moderately severe pharyngeal deficits with mostly motor deficits.   Decreased muscular contraction *pharyngeal constrictors, tongue base retraction results in gross pharyngeal residuals (that mix with secretions) with pt awareness.  Residuals are much more pronounced with items of increased viscocity.  He was unable to clear small peach into esophagus despite multiple dry and liquid swallows.  He finally was able to expectorate peach with cues to "hock"- fortunately effectively helping to protect his airway.  Head turn left/right nor chin tuck decrease residual accumulation.  Trace penetration noted due to residuals in pharynx spilling into open airway.  Cued cough and re=-swallow helpful.  Of note when pt reswallows - there is mild retrograde propulsion of bolus in pharynx- near pharyngeal tongue base.   Recommend pt be able to consume full liquid diet with strict precautions.  Using live monitor, verbal and visual feedback  as well as swallow precaution sign= effective strategies reinforced.  Will follow up to help mitigate dysphagia and to establish goals/exercises dependent on source of deficits.  Pt is high malnutrition risk due to his level of dysphagia currently.  Of note, pt reported that he felt like he was going to choke during the barium swallow study - indicative of good sensation to residuals.  He did not recall if he coughed or sensed aspiration.     Swallow Evaluation Recommendations       SLP Diet Recommendations: Thin liquid;Other (Comment)(full liquids, no pudding, grits or thick items)   Liquid Administration via: Cup;Straw   Medication Administration:  (liquid suspension or via IV)   Supervision: Patient able to self feed   Compensations: Slow rate;Small sips/bites;Multiple dry swallows after each bite/sip;Effortful swallow;Hard cough after swallow   Postural Changes: Remain semi-upright after after feeds/meals (Comment);Seated upright at 90 degrees   Oral Care Recommendations: Oral care QID   Other Recommendations: Have oral suction available(set up oral suction for pt's use)    [name], [place] [facility] SLP [phone]

## 2017-12-03 NOTE — Plan of Care (Signed)
  Problem: Nutrition: Goal: Adequate nutrition will be maintained Outcome: Progressing Pt tolerating oral intake of clear liquids.  Patient able to"hock" and clear throat.  Patient able to drink nutritional supplements.

## 2017-12-03 NOTE — Progress Notes (Addendum)
[facility]                                                                              Patient Demographics  [name], is a 77 y.o. male, DOB - [birth year], EXB:[id number]  Admit date - 11/30/2017   Admitting Physician Merton Border, MD  Outpatient Primary MD for the patient is [name], [name], MD  Outpatient specialists:   LOS - 1  days   Medical records reviewed and are as summarized below:    Chief Complaint  Patient presents with  . Allergic Reaction       Brief summary   Patient is a 77 year old male with CAD, chronic anemia, iron deficiency, receives iron infusions every 3 months, recent knee replacement surgery in February 2019, had iron transfusion on 3/29.  Patient presented to ED with dysphagia and dysarthria that started evening of 3/29, felt he was choking and his speech was slurred.  Family thought it could be due to dental problem and went to the dentist on 4/1, had the partial plate fixed with improvement and resolution of symptoms.  Patient also felt swelling on the right side of his face.  However subsequently, on 4/3 patient's symptoms restarted and he continued to choke and had slurred speech.  Patient was evaluated by PCP who started the patient on steroids as possibly an allergic reaction to the iron without significant improvement.  Patient presented to ED, initially thought to have possible CVA, evaluated by neurology.  MRI however negative for CVA.   Assessment & Plan    Active Problems: Dysarthria with dysphagia -Unclear etiology, per patient started after the iron transfusion -Patient was evaluated by neurology, stroke workup negative, MRI negative for acute CVA, MRA showed no aneurysms or stenosis. - 2D echo showed EF of 60-65%, carotid Dopplers 1-39% ICA stenosis bilaterally -Patient was evaluated by speech therapy on 4/9 and observed no oral issues, aspiration with any consistency.  However was aspirating with all  consistencies on 410, made n.p.o. -Esophagogram showed small aspiration but otherwise no significant abnormality, mild GERD.   -Currently on PPI -Evaluated by neurology, recommended ENT exam for unclear symptoms of dysphagia.  No clear etiology by ENT, Dr. Erik Obey, recommended CT neck, speech therapy and neurology to reconsider other causes including possible myasthenia gravis or heavy metal exposure -D/w neurology, Dr. Cheral Marker, ordered heavy metal panel, myasthenia gravis panel, recommended further workup outpatient with neurology, may include EMGs, nerve conduction studies, rule out bulbar pathology -CT soft tissue neck negative for any mass or pathology  Atypical chest pain -Troponin x1-, EKG showed no acute ST-T wave changes.  Per patient he has off and on chest pain, worse with anxiety, in the left side and radiating to right side and abdomen -CT angiogram of the chest negative for PE  BPH Continue finasteride   Code Status: Full CODE STATUS DVT Prophylaxis:  Lovenox  Family Communication: Discussed in detail with the patient, all imaging results, lab results explained to the patient, patient's wife and brother at the bedside   Disposition Plan: When patient is tolerating diet, further workup outpatient by neurology  Time Spent in minutes 35  minutes  Procedures:   MRI, MRA brain 2D echo  Consultants:   Neurology ENT  Antimicrobials:      Medications  Scheduled Meds: . aspirin EC  81 mg Oral Daily  . DULoxetine  60 mg Oral Daily  . enoxaparin (LOVENOX) injection  55 mg Subcutaneous Q24H  . finasteride  5 mg Oral Daily  . iopamidol      . LORazepam  0.5 mg Intravenous Once  . methylPREDNISolone (SOLU-MEDROL) injection  60 mg Intravenous Q6H  . pantoprazole  40 mg Oral BID AC  . polyvinyl alcohol  1-2 drop Both Eyes Daily  . tamsulosin  0.8 mg Oral Daily   Continuous Infusions: . dextrose 5 % and 0.45% NaCl Stopped (12/03/17 0230)   PRN Meds:.acetaminophen  **OR** acetaminophen (TYLENOL) oral liquid 160 mg/5 mL **OR** acetaminophen, lidocaine, lidocaine-EPINEPHrine, LORazepam, oxymetazoline, senna-docusate, silver nitrate applicators, TRIPLE ANTIBIOTIC   Antibiotics   Anti-infectives (From admission, onward)   None        Subjective:   [name] was seen and examined today.  Overnight had some confusion and anxiety.  This morning feels better, no chest pain, no shortness of breath.  No dizziness, nausea, vomiting, abdominal pain, no focal weakness.  No fevers or chills.  Objective:   Vitals:   12/03/17 0927 12/03/17 1200 12/03/17 1204 12/03/17 1215  BP: 132/68 136/72 137/72   Pulse: (!) 54 60    Resp:  14    Temp:    98.2 F (36.8 C)  TempSrc:    Oral  SpO2:  99%    Weight:      Height:       No intake or output data in the 24 hours ending 12/03/17 1353   Wt Readings from Last 3 Encounters:  11/30/17 108 kg (238 lb)  11/26/17 108 kg (238 lb)  11/13/17 111.1 kg (245 lb)     Exam   General: Alert and oriented x 3, NAD  Eyes:   HEENT:   Cardiovascular: S1 S2 auscultated, Regular rate and rhythm. No pedal edema b/l  Respiratory: Clear to auscultation bilaterally, no wheezing, rales or rhonchi  Gastrointestinal: Soft, nontender, nondistended, + bowel sounds  Ext: no pedal edema bilaterally  Neuro: no new deficits  Musculoskeletal: No digital cyanosis, clubbing  Skin: No rashes  Psych: Normal affect and demeanor, alert and oriented x3    Data Reviewed:  I have personally reviewed following labs and imaging studies  Micro Results No results found for this or any previous visit (from the past 240 hour(s)).  Radiology Reports Dg Chest 2 View  Result Date: 11/30/2017 CLINICAL DATA:  Tongue and facial swelling for several days. Difficulty speaking. EXAM: CHEST - 2 VIEW COMPARISON:  08/14/2015 FINDINGS: The heart size and mediastinal contours are within normal limits. Chronic elevation of left  hemidiaphragm is unchanged. Both lungs are clear. Several old left rib fracture deformities are again seen. Left shoulder prosthesis noted. IMPRESSION: Chronic elevation of left hemidiaphragm. No active cardiopulmonary disease. Electronically Signed   By: Earle Gell M.D.   On: 11/30/2017 18:50   Ct Head Wo Contrast  Result Date: 11/30/2017 CLINICAL DATA:  Allergic reaction after iron infusion. EXAM: CT HEAD WITHOUT CONTRAST TECHNIQUE: Contiguous axial images were obtained from the base of the skull through the vertex without intravenous contrast. COMPARISON:  MRI of the head December 14, 2014 FINDINGS: BRAIN: No intraparenchymal hemorrhage, mass effect nor midline shift. Patchy to confluent supratentorial white matter hypodensities within normal range for patient's  age, though non-specific are most compatible with chronic small vessel ischemic disease. Tiny RIGHT thalamus lacunar infarct versus perivascular space. No acute large vascular territory infarcts. No abnormal extra-axial fluid collections. Basal cisterns are patent. VASCULAR: Mild calcific atherosclerosis of the carotid siphons. SKULL: No skull fracture. No significant scalp soft tissue swelling. LEFT frontal scalp scarring. SINUSES/ORBITS: The mastoid air-cells and included paranasal sinuses are well-aerated.The included ocular globes and orbital contents are non-suspicious. Status post bilateral ocular lens implants. OTHER: None. IMPRESSION: 1. No acute intracranial process. 2. Moderate to severe chronic small vessel ischemic disease. Electronically Signed   By: Elon Alas M.D.   On: 11/30/2017 17:03   Ct Angio Chest Pe W Or Wo Contrast  Result Date: 12/02/2017 CLINICAL DATA:  PE suspected, intermediate probability, positive D-dimer. Difficulty breathing when laying flat EXAM: CT ANGIOGRAPHY CHEST WITH CONTRAST TECHNIQUE: Multidetector CT imaging of the chest was performed using the standard protocol during bolus administration of intravenous  contrast. Multiplanar CT image reconstructions and MIPs were obtained to evaluate the vascular anatomy. CONTRAST:  15mL ISOVUE-370 IOPAMIDOL (ISOVUE-370) INJECTION 76% COMPARISON:  Abdominal CT 03/06/2014. FINDINGS: Cardiovascular: Satisfactory opacification of the pulmonary arteries to the segmental level. No evidence of pulmonary embolism. Normal heart size. No pericardial effusion. Diffuse coronary atherosclerotic calcification. Aortic atherosclerotic calcification is mild. Mediastinum/Nodes: Negative for adenopathy. Lungs/Pleura: Chronic elevation the left diaphragm with overlying left lower lobe multi segment collapse. Mild atelectasis at the right base. There is no edema, consolidation, effusion, or pneumothorax. Upper Abdomen: Barium in the stomach from recent esophagram. Small left renal cyst. Musculoskeletal: Extensive spondylosis with multilevel thoracic ankylosis. Advanced cervical spine spondylosis and disc degeneration where seen. Left glenohumeral arthroplasty. Right glenohumeral osteoarthritis. No acute or aggressive finding. Review of the MIP images confirms the above findings. IMPRESSION: 1. Negative for pulmonary embolism or other acute finding. 2. Chronic elevation of the left diaphragm. There is multi segment left lower lobe collapse than is greater than seen on 2015 abdominal CT. Mild atelectasis at the right base. 3.  Aortic Atherosclerosis (ICD10-I70.0).  Coronary atherosclerosis. 4. Spondylosis with multilevel thoracic ankylosis. Electronically Signed   By: Monte Fantasia M.D.   On: 12/02/2017 14:56   Mr Jeri Cos GM Contrast  Result Date: 12/02/2017 CLINICAL DATA:  Initial evaluation for acute ataxia. EXAM: MRI HEAD WITHOUT AND WITH CONTRAST MRA HEAD WITHOUT CONTRAST TECHNIQUE: Multiplanar, multiecho pulse sequences of the brain and surrounding structures were obtained without and with intravenous contrast. Angiographic images of the head were obtained using MRA technique without  contrast. CONTRAST:  70mL MULTIHANCE GADOBENATE DIMEGLUMINE 529 MG/ML IV SOLN COMPARISON:  Prior CT from 11/30/2017. FINDINGS: MRI HEAD FINDINGS Brain: Study mildly degraded by motion artifact. Diffuse prominence of the CSF containing spaces is compatible with generalized age-related cerebral atrophy. Patchy and confluent T2/FLAIR hyperintensity within the periventricular and deep white matter both cerebral hemispheres most consistent with chronic small vessel ischemic disease, moderate in nature. No abnormal foci of restricted diffusion to suggest acute or subacute ischemia. Gray-white matter differentiation maintained. No encephalomalacia to suggest chronic infarction. No foci of susceptibility artifact to suggest acute or chronic intracranial hemorrhage. No mass lesion, midline shift or mass effect. Ventricles normal size without hydrocephalus. No extra-axial fluid collection. Major dural sinuses grossly patent. No abnormal enhancement. Pituitary gland suprasellar region normal. Midline structures intact and normal. Vascular: Major intracranial vascular flow voids are maintained. Skull and upper cervical spine: Craniocervical junction within normal limits. Upper cervical spine normal. Bone marrow signal intensity mildly decreased on T1  weighted imaging, suspected be related to patient's history of anemia. No discrete osseous lesions. Scalp soft tissues within normal limits. Sinuses/Orbits: Globes normal soft tissues within normal limits. Patient status post cataract extraction bilaterally. Few small retention cysts noted within left maxillary sinus. Paranasal sinuses are otherwise clear. No mastoid effusion. Inner ear structures normal. Other: None. MRA HEAD FINDINGS ANTERIOR CIRCULATION: Distal cervical segments of the internal carotid arteries are patent with antegrade flow. Petrous, cavernous, and supraclinoid segments patent without flow-limiting stenosis. Ophthalmic arteries patent proximally. ICA termini  widely patent. A1 segments widely patent. Anterior communicating artery not well delineated. Anterior cerebral arteries widely patent to their distal aspects without stenosis. M1 segments patent without stenosis or occlusion. No proximal M2 occlusion. Distal MCA branches well perfused and symmetric. POSTERIOR CIRCULATION: Dominant right vertebral artery patent to the vertebrobasilar junction without stenosis. Partially visualized left vertebral artery hypoplastic and largely terminates in PICA. Posterior inferior cerebral arteries themselves are patent proximally. Basilar artery widely patent to its distal aspect. Superior cerebral arteries patent bilaterally. Both of the posterior cerebral artery supplied via the basilar and are well perfused to their distal aspects without stenosis. No intracranial aneurysm. IMPRESSION: MRI HEAD IMPRESSION 1. No acute intracranial abnormality identified. 2. Generalized age-related cerebral atrophy with moderate cerebral white matter disease, nonspecific, but most commonly related to chronic small vessel ischemic changes. MRA HEAD IMPRESSION Negative intracranial MRA. No large vessel occlusion. No high-grade or correctable stenosis identified. Electronically Signed   By: Jeannine Boga M.D.   On: 12/02/2017 00:36   Dg Esophagus  Result Date: 12/02/2017 CLINICAL DATA:  Dysphagia with purees during bedside swallow study this morning. EXAM: ESOPHOGRAM / BARIUM SWALLOW / BARIUM TABLET STUDY TECHNIQUE: Combined double contrast and single contrast examination performed using effervescent crystals, thick barium liquid, and thin barium liquid. The patient was observed with fluoroscopy swallowing a 13 mm barium sulphate tablet. FLUOROSCOPY TIME:  Fluoroscopy Time:  3 minutes, 6 seconds. Radiation Exposure Index (if provided by the fluoroscopic device): 47.5 mGy. Number of Acquired Spot Images: 0 COMPARISON:  None. FINDINGS: The pharyngeal phase of swallowing demonstrated small  amount of aspiration with thick barium. Mild tertiary contractions in the mid to distal esophagus. No obstruction to the forward flow of contrast throughout the esophagus and into the stomach. Normal esophageal course and contour. Normal esophageal mucosal pattern. No esophageal stricture, ulceration, or other significant abnormality. No hiatal hernia. Mild gastroesophageal reflux into the distal esophagus was elicited using the water siphon test. A 13 mm barium tablet passed without difficulty into the stomach. IMPRESSION: 1. Small aspiration with thick barium. 2. Mild nonspecific esophageal dysmotility. 3. Mild gastroesophageal reflux. Electronically Signed   By: Titus Dubin M.D.   On: 12/02/2017 12:10   Dg Swallowing Func-speech Pathology  Result Date: 12/03/2017 Objective Swallowing Evaluation: Type of Study: MBS-Modified Barium Swallow Study  Patient Details Name: THEORDORE CISNERO MRN: [id number] Date of Birth: 1941/04/13 Today's Date: 12/03/2017 Time: SLP Start Time (ACUTE ONLY): 7106 -SLP Stop Time (ACUTE ONLY): 0850 SLP Time Calculation (min) (ACUTE ONLY): 28 min Past Medical History: Past Medical History: Diagnosis Date . Anemia  . Blood transfusion without reported diagnosis 02/23/2014  3 units for iron deficiency anemia. as a baby - whenhe had pneumonia . BPH (benign prostatic hyperplasia)  . CTS (carpal tunnel syndrome)   better . Dyspnea  . GERD (gastroesophageal reflux disease)  . Hyperlipidemia  . Hypertension   "THEY DIAGNOSED ME YEARS AGO BUT LATELY IVE ACTUALLY HAD LOW BLOOD PRESSURES "  .  Insomnia  . LBP (low back pain)  . Osteoarthritis  . Pneumonia   as a baby . Sleep apnea   no cpap Past Surgical History: Past Surgical History: Procedure Laterality Date . CARDIAC CATHETERIZATION N/A 08/15/2015  Procedure: Left Heart Cath and Coronary Angiography;  Surgeon: Lorretta Harp, MD;  Location: [facility];  Service: Cardiovascular;  Laterality: N/A; . CARPAL TUNNEL RELEASE Bilateral  1996, 2004  Dr Lenoard Aden thumb surgery.  more than once . COLONOSCOPY  2005, 2012  diverticulosis.  Marland Kitchen ESOPHAGOGASTRODUODENOSCOPY N/A 02/23/2014  Procedure: ESOPHAGOGASTRODUODENOSCOPY (EGD);  Surgeon: Irene Shipper, MD;  Location: [facility];  Service: Endoscopy;  Laterality: N/A; . EYE SURGERY  2012  both cataracts, Lasik . LEFT KNEE ARTHROSCOPY Left 07/16/2017   [facility]  . LUMBAR FUSION    x 3 . REVERSE SHOULDER ARTHROPLASTY Left 07/04/2016  Procedure: LEFT REVERSE SHOULDER ARTHROPLASTY;  Surgeon: Netta Cedars, MD;  Location: [facility];  Service: Orthopedics;  Laterality: Left; . TONSILLECTOMY  1946 . TOTAL KNEE ARTHROPLASTY  2003  Right . TOTAL KNEE ARTHROPLASTY Left 09/25/2017  Procedure: LEFT TOTAL KNEE ARTHROPLASTY;  Surgeon: Sydnee Cabal, MD;  Location: [facility];  Service: Orthopedics;  Laterality: Left; HPI: 77 year old male admitted 11/30/17 with stroke like symptoms, orofacial swelling. PMH significant for GERD. Head CT and CXR negative, MRI negative.  ENT saw pt and documents concern for possible heavy metal exposure or myasthenia gravis.  Pt reports sudden onset of dysphagia and tongue feeling "swollen" - he states today is better than yesterday.  Esophagram during this hospital admit showed mild dysmotility, small amount of aspiration.  Pt has been NPO due to level of dysphagia.   Subjective: pt awake in chair Assessment / Plan / Recommendation CHL IP CLINICAL IMPRESSIONS 12/03/2017 Clinical Impression Mild oral and moderately severe pharyngeal deficits with mostly motor deficits.   Decreased muscular contraction *pharyngeal constrictors, tongue base retraction results in gross pharyngeal residuals (that mix with secretions) with pt awareness.  Residuals are much more pronounced with items of increased viscocity.  He was unable to clear small peach into esophagus despite multiple dry and liquid swallows.  He finally was able to expectorate peach with cues to "hock"- fortunately effectively helping to protect his  airway.  Head turn left/right nor chin tuck decrease residual accumulation.  Trace penetration noted due to residuals in pharynx spilling into open airway.  Cued cough and re=-swallow helpful.  Of note when pt reswallows - there is mild retrograde propulsion of bolus in pharynx- near pharyngeal tongue base.   Recommend pt be able to consume full liquid diet with strict precautions.  Using live monitor, verbal and visual feedback  as well as swallow precaution sign= effective strategies reinforced.  Will follow up to help mitigate dysphagia and to establish goals/exercises dependent on source of deficits.  Pt is high malnutrition risk due to his level of dysphagia currently.  Of note, pt reported that he felt like he was going to choke during the barium swallow study - indicative of good sensation to residuals.  He did not recall if he coughed or sensed aspiration.   SLP Visit Diagnosis Dysphagia, oropharyngeal phase (R13.12);Dysphagia, pharyngoesophageal phase (R13.14) Attention and concentration deficit following -- Frontal lobe and executive function deficit following -- Impact on safety and function Risk for inadequate nutrition/hydration;Moderate aspiration risk   CHL IP TREATMENT RECOMMENDATION 12/03/2017 Treatment Recommendations Therapy as outlined in treatment plan below   Prognosis 12/03/2017 Prognosis for Safe Diet Advancement Fair Barriers  to Reach Goals Severity of deficits;Other (Comment) Barriers/Prognosis Comment -- CHL IP DIET RECOMMENDATION 12/03/2017 SLP Diet Recommendations Thin liquid;Other (Comment) Liquid Administration via Cup;Straw Medication Administration (No Data) Compensations Slow rate;Small sips/bites;Multiple dry swallows after each bite/sip;Effortful swallow;Hard cough after swallow Postural Changes Remain semi-upright after after feeds/meals (Comment);Seated upright at 90 degrees   CHL IP OTHER RECOMMENDATIONS 12/03/2017 Recommended Consults -- Oral Care Recommendations Oral care QID  Other Recommendations Have oral suction available   CHL IP FOLLOW UP RECOMMENDATIONS 12/02/2017 Follow up Recommendations (No Data)   CHL IP FREQUENCY AND DURATION 12/03/2017 Speech Therapy Frequency (ACUTE ONLY) min 2x/week Treatment Duration 2 weeks      CHL IP ORAL PHASE 12/03/2017 Oral Phase Impaired Oral - Pudding Teaspoon -- Oral - Pudding Cup -- Oral - Honey Teaspoon -- Oral - Honey Cup -- Oral - Nectar Teaspoon Reduced posterior propulsion Oral - Nectar Cup Lingual/palatal residue Oral - Nectar Straw -- Oral - Thin Teaspoon Reduced posterior propulsion;Lingual/palatal residue Oral - Thin Cup Reduced posterior propulsion;Lingual/palatal residue Oral - Thin Straw Lingual/palatal residue Oral - Puree -- Oral - Mech Soft Reduced posterior propulsion;Lingual/palatal residue Oral - Regular -- Oral - Multi-Consistency -- Oral - Pill -- Oral Phase - Comment posterior lingual residuals coating tongue  CHL IP PHARYNGEAL PHASE 12/03/2017 Pharyngeal Phase Impaired Pharyngeal- Pudding Teaspoon -- Pharyngeal -- Pharyngeal- Pudding Cup -- Pharyngeal -- Pharyngeal- Honey Teaspoon -- Pharyngeal -- Pharyngeal- Honey Cup -- Pharyngeal -- Pharyngeal- Nectar Teaspoon Reduced pharyngeal peristalsis;Reduced tongue base retraction;Pharyngeal residue - valleculae;Pharyngeal residue - pyriform;Reduced epiglottic inversion;Pharyngeal residue - cp segment;Pharyngeal residue - posterior pharnyx Pharyngeal -- Pharyngeal- Nectar Cup Reduced pharyngeal peristalsis;Reduced tongue base retraction;Reduced epiglottic inversion;Pharyngeal residue - valleculae;Pharyngeal residue - pyriform;Pharyngeal residue - cp segment;Pharyngeal residue - posterior pharnyx Pharyngeal -- Pharyngeal- Nectar Straw -- Pharyngeal -- Pharyngeal- Thin Teaspoon Reduced pharyngeal peristalsis;Reduced epiglottic inversion;Reduced tongue base retraction;Pharyngeal residue - valleculae;Pharyngeal residue - pyriform;Pharyngeal residue - cp segment Pharyngeal -- Pharyngeal-  Thin Cup Reduced pharyngeal peristalsis;Reduced epiglottic inversion;Pharyngeal residue - valleculae;Pharyngeal residue - pyriform;Reduced tongue base retraction Pharyngeal -- Pharyngeal- Thin Straw Reduced pharyngeal peristalsis;Reduced epiglottic inversion;Pharyngeal residue - valleculae;Pharyngeal residue - pyriform;Reduced tongue base retraction Pharyngeal -- Pharyngeal- Puree -- Pharyngeal -- Pharyngeal- Mechanical Soft Reduced pharyngeal peristalsis;Reduced epiglottic inversion;Pharyngeal residue - valleculae;Reduced tongue base retraction Pharyngeal -- Pharyngeal- Regular -- Pharyngeal -- Pharyngeal- Multi-consistency -- Pharyngeal -- Pharyngeal- Pill -- Pharyngeal -- Pharyngeal Comment dry swallows helpful to decrease pharyngeal residuals of liquids, pt unable to clear peach from vallecular space with dry swallows or liquid consumption - he was able to expectorate with verbal cue, penetration of liquids occur due to residuals spilling itnto open airway - no aspiration observed, cued cough/throat clear effective to clear penetrates, head turn left/right and chin tuck postures not effective to decrease residuals  CHL IP CERVICAL ESOPHAGEAL PHASE 12/03/2017 Cervical Esophageal Phase Impaired Pudding Teaspoon -- Pudding Cup -- Honey Teaspoon -- Honey Cup -- Nectar Teaspoon Reduced cricopharyngeal relaxation Nectar Cup Reduced cricopharyngeal relaxation Nectar Straw -- Thin Teaspoon Reduced cricopharyngeal relaxation Thin Cup Reduced cricopharyngeal relaxation Thin Straw Reduced cricopharyngeal relaxation Puree -- Mechanical Soft Reduced cricopharyngeal relaxation Regular -- Multi-consistency -- Pill -- Cervical Esophageal Comment -- [name], MS [facility] SLP [phone]              Mr Jodene Nam Head Wo Contrast  Result Date: 12/02/2017 CLINICAL DATA:  Initial evaluation for acute ataxia. EXAM: MRI HEAD WITHOUT AND WITH CONTRAST MRA HEAD WITHOUT CONTRAST TECHNIQUE: Multiplanar, multiecho pulse sequences of the brain  and surrounding  structures were obtained without and with intravenous contrast. Angiographic images of the head were obtained using MRA technique without contrast. CONTRAST:  27mL MULTIHANCE GADOBENATE DIMEGLUMINE 529 MG/ML IV SOLN COMPARISON:  Prior CT from 11/30/2017. FINDINGS: MRI HEAD FINDINGS Brain: Study mildly degraded by motion artifact. Diffuse prominence of the CSF containing spaces is compatible with generalized age-related cerebral atrophy. Patchy and confluent T2/FLAIR hyperintensity within the periventricular and deep white matter both cerebral hemispheres most consistent with chronic small vessel ischemic disease, moderate in nature. No abnormal foci of restricted diffusion to suggest acute or subacute ischemia. Gray-white matter differentiation maintained. No encephalomalacia to suggest chronic infarction. No foci of susceptibility artifact to suggest acute or chronic intracranial hemorrhage. No mass lesion, midline shift or mass effect. Ventricles normal size without hydrocephalus. No extra-axial fluid collection. Major dural sinuses grossly patent. No abnormal enhancement. Pituitary gland suprasellar region normal. Midline structures intact and normal. Vascular: Major intracranial vascular flow voids are maintained. Skull and upper cervical spine: Craniocervical junction within normal limits. Upper cervical spine normal. Bone marrow signal intensity mildly decreased on T1 weighted imaging, suspected be related to patient's history of anemia. No discrete osseous lesions. Scalp soft tissues within normal limits. Sinuses/Orbits: Globes normal soft tissues within normal limits. Patient status post cataract extraction bilaterally. Few small retention cysts noted within left maxillary sinus. Paranasal sinuses are otherwise clear. No mastoid effusion. Inner ear structures normal. Other: None. MRA HEAD FINDINGS ANTERIOR CIRCULATION: Distal cervical segments of the internal carotid arteries are patent with  antegrade flow. Petrous, cavernous, and supraclinoid segments patent without flow-limiting stenosis. Ophthalmic arteries patent proximally. ICA termini widely patent. A1 segments widely patent. Anterior communicating artery not well delineated. Anterior cerebral arteries widely patent to their distal aspects without stenosis. M1 segments patent without stenosis or occlusion. No proximal M2 occlusion. Distal MCA branches well perfused and symmetric. POSTERIOR CIRCULATION: Dominant right vertebral artery patent to the vertebrobasilar junction without stenosis. Partially visualized left vertebral artery hypoplastic and largely terminates in PICA. Posterior inferior cerebral arteries themselves are patent proximally. Basilar artery widely patent to its distal aspect. Superior cerebral arteries patent bilaterally. Both of the posterior cerebral artery supplied via the basilar and are well perfused to their distal aspects without stenosis. No intracranial aneurysm. IMPRESSION: MRI HEAD IMPRESSION 1. No acute intracranial abnormality identified. 2. Generalized age-related cerebral atrophy with moderate cerebral white matter disease, nonspecific, but most commonly related to chronic small vessel ischemic changes. MRA HEAD IMPRESSION Negative intracranial MRA. No large vessel occlusion. No high-grade or correctable stenosis identified. Electronically Signed   By: Jeannine Boga M.D.   On: 12/02/2017 00:36    Lab Data:  CBC: Recent Labs  Lab 11/30/17 1550 11/30/17 1656 12/02/17 0704  WBC 6.8  --  10.2  NEUTROABS 3.4  --   --   HGB 12.0* 12.9* 12.3*  HCT 39.2 38.0* 39.4  MCV 92.5  --  90.2  PLT 276  --  409   Basic Metabolic Panel: Recent Labs  Lab 11/30/17 1550 11/30/17 1656 12/02/17 0704  NA 139 140 138  K 3.6 3.6 3.8  CL 99* 99* 103  CO2 29  --  25  GLUCOSE 93 90 152*  BUN 17 17 17   CREATININE 0.84 0.80 0.71  CALCIUM 9.4  --  9.7   GFR: Estimated Creatinine Clearance: 99.8 mL/min (by  C-G formula based on SCr of 0.71 mg/dL). Liver Function Tests: Recent Labs  Lab 11/30/17 1550 12/02/17 0704  AST 22 19  ALT 22  24  ALKPHOS 66 67  BILITOT 0.6 0.5  PROT 6.7 6.4*  ALBUMIN 3.9 3.6   No results for input(s): LIPASE, AMYLASE in the last 168 hours. No results for input(s): AMMONIA in the last 168 hours. Coagulation Profile: Recent Labs  Lab 11/30/17 1550  INR 0.99   Cardiac Enzymes: Recent Labs  Lab 12/02/17 0905  TROPONINI <0.03   BNP (last 3 results) No results for input(s): PROBNP in the last 8760 hours. HbA1C: Recent Labs    12/01/17 0512  HGBA1C 4.6*   CBG: No results for input(s): GLUCAP in the last 168 hours. Lipid Profile: Recent Labs    12/01/17 0512  CHOL 182  HDL 48  LDLCALC 99  TRIG 174*  CHOLHDL 3.8   Thyroid Function Tests: Recent Labs    12/03/17 0937  TSH 0.249*   Anemia Panel: Recent Labs    12/03/17 0937  VITAMINB12 1,079*  FOLATE 20.0   Urine analysis:    Component Value Date/Time   COLORURINE YELLOW 11/30/2017 2024   APPEARANCEUR CLEAR 11/30/2017 2024   LABSPEC 1.011 11/30/2017 2024   PHURINE 6.0 11/30/2017 2024   GLUCOSEU NEGATIVE 11/30/2017 2024   GLUCOSEU NEGATIVE 03/01/2012 1029   HGBUR NEGATIVE 11/30/2017 2024   BILIRUBINUR NEGATIVE 11/30/2017 2024   BILIRUBINUR negative 06/11/2016 1106   [place] 11/30/2017 2024   PROTEINUR NEGATIVE 11/30/2017 2024   UROBILINOGEN negative 06/11/2016 1106   UROBILINOGEN 1.0 03/07/2014 0019   NITRITE NEGATIVE 11/30/2017 2024   LEUKOCYTESUR NEGATIVE 11/30/2017 2024     Copper Basnett M.D. [facility] 12/03/2017, 1:53 PM  Pager: [phone] Between 7am to 7pm - call Pager - [phone]  After 7pm go to [email or web address] - password [email or web address]  Call night coverage person covering after 7pm

## 2017-12-03 NOTE — Progress Notes (Signed)
Patient cooperative and apologetic for previous situation with staff. patient assisted back to bed from recliner, given oxygen at 1L via [place] to help calm with breathing. Head of bed elevated to 45 degrees to prevent dyspnea. Patient is oriented x3. Will continue to monitor for changes.

## 2017-12-04 DIAGNOSIS — F419 Anxiety disorder, unspecified: Secondary | ICD-10-CM | POA: Diagnosis present

## 2017-12-04 DIAGNOSIS — R9082 White matter disease, unspecified: Secondary | ICD-10-CM | POA: Diagnosis present

## 2017-12-04 DIAGNOSIS — R471 Dysarthria and anarthria: Secondary | ICD-10-CM | POA: Diagnosis present

## 2017-12-04 DIAGNOSIS — M4690 Unspecified inflammatory spondylopathy, site unspecified: Secondary | ICD-10-CM | POA: Diagnosis present

## 2017-12-04 DIAGNOSIS — D500 Iron deficiency anemia secondary to blood loss (chronic): Secondary | ICD-10-CM | POA: Diagnosis present

## 2017-12-04 DIAGNOSIS — R1310 Dysphagia, unspecified: Secondary | ICD-10-CM

## 2017-12-04 DIAGNOSIS — F329 Major depressive disorder, single episode, unspecified: Secondary | ICD-10-CM | POA: Diagnosis present

## 2017-12-04 DIAGNOSIS — E669 Obesity, unspecified: Secondary | ICD-10-CM | POA: Diagnosis present

## 2017-12-04 DIAGNOSIS — N400 Enlarged prostate without lower urinary tract symptoms: Secondary | ICD-10-CM | POA: Diagnosis present

## 2017-12-04 DIAGNOSIS — I10 Essential (primary) hypertension: Secondary | ICD-10-CM | POA: Diagnosis present

## 2017-12-04 DIAGNOSIS — G4730 Sleep apnea, unspecified: Secondary | ICD-10-CM | POA: Diagnosis present

## 2017-12-04 DIAGNOSIS — G4700 Insomnia, unspecified: Secondary | ICD-10-CM | POA: Diagnosis present

## 2017-12-04 DIAGNOSIS — K219 Gastro-esophageal reflux disease without esophagitis: Secondary | ICD-10-CM | POA: Diagnosis present

## 2017-12-04 DIAGNOSIS — G3184 Mild cognitive impairment, so stated: Secondary | ICD-10-CM | POA: Diagnosis present

## 2017-12-04 DIAGNOSIS — Z6832 Body mass index (BMI) 32.0-32.9, adult: Secondary | ICD-10-CM | POA: Diagnosis not present

## 2017-12-04 DIAGNOSIS — Z96653 Presence of artificial knee joint, bilateral: Secondary | ICD-10-CM | POA: Diagnosis present

## 2017-12-04 DIAGNOSIS — M1990 Unspecified osteoarthritis, unspecified site: Secondary | ICD-10-CM | POA: Diagnosis present

## 2017-12-04 DIAGNOSIS — I2510 Atherosclerotic heart disease of native coronary artery without angina pectoris: Secondary | ICD-10-CM | POA: Diagnosis present

## 2017-12-04 DIAGNOSIS — I6523 Occlusion and stenosis of bilateral carotid arteries: Secondary | ICD-10-CM | POA: Diagnosis present

## 2017-12-04 DIAGNOSIS — M4313 Spondylolisthesis, cervicothoracic region: Secondary | ICD-10-CM | POA: Diagnosis present

## 2017-12-04 DIAGNOSIS — K224 Dyskinesia of esophagus: Secondary | ICD-10-CM | POA: Diagnosis present

## 2017-12-04 DIAGNOSIS — M47812 Spondylosis without myelopathy or radiculopathy, cervical region: Secondary | ICD-10-CM | POA: Diagnosis present

## 2017-12-04 DIAGNOSIS — Z96612 Presence of left artificial shoulder joint: Secondary | ICD-10-CM | POA: Diagnosis present

## 2017-12-04 DIAGNOSIS — E785 Hyperlipidemia, unspecified: Secondary | ICD-10-CM | POA: Diagnosis present

## 2017-12-04 LAB — RPR: RPR Ser Ql: NONREACTIVE

## 2017-12-04 LAB — HEAVY METALS, BLOOD
Arsenic: 8 ug/L (ref 2–23)
Lead: 2 ug/dL (ref 0–4)
Mercury: NOT DETECTED ug/L (ref 0.0–14.9)

## 2017-12-04 LAB — T3: T3, Total: 93 ng/dL (ref 71–180)

## 2017-12-04 LAB — HIV ANTIBODY (ROUTINE TESTING W REFLEX): HIV Screen 4th Generation wRfx: NONREACTIVE

## 2017-12-04 MED ORDER — ENOXAPARIN SODIUM 60 MG/0.6ML ~~LOC~~ SOLN
50.0000 mg | SUBCUTANEOUS | Status: DC
  Administered 2017-12-04: 50 mg via SUBCUTANEOUS
  Filled 2017-12-04: qty 0.6

## 2017-12-04 MED ORDER — HALOPERIDOL LACTATE 5 MG/ML IJ SOLN
INTRAMUSCULAR | Status: AC
  Administered 2017-12-04: 5 mg
  Filled 2017-12-04: qty 1

## 2017-12-04 MED ORDER — MINERAL OIL RE ENEM
1.0000 | ENEMA | Freq: Once | RECTAL | Status: AC
  Administered 2017-12-04: 1 via RECTAL
  Filled 2017-12-04: qty 1

## 2017-12-04 MED ORDER — LORAZEPAM 2 MG/ML IJ SOLN
0.5000 mg | Freq: Once | INTRAMUSCULAR | Status: AC
  Administered 2017-12-04: 0.5 mg via INTRAVENOUS
  Filled 2017-12-04: qty 1

## 2017-12-04 MED ORDER — HALOPERIDOL LACTATE 5 MG/ML IJ SOLN
5.0000 mg | Freq: Once | INTRAMUSCULAR | Status: AC
  Administered 2017-12-03: 03:00:00 via INTRAVENOUS

## 2017-12-04 MED ORDER — LORAZEPAM 2 MG/ML IJ SOLN
1.0000 mg | Freq: Once | INTRAMUSCULAR | Status: AC
  Administered 2017-12-04: 1 mg via INTRAVENOUS

## 2017-12-04 NOTE — Progress Notes (Signed)
Patient ID: Lee FEHNEL, male   DOB: 11-04-1940, 77 y.o.   MRN: [id number]                                                                PROGRESS NOTE                                                                                                                                                                                                             Patient Demographics:    [name], is a 77 y.o. male, DOB - Feb 04, 1941, KXF:[id number]  Admit date - 11/30/2017   Admitting Physician Merton Border, MD  Outpatient Primary MD for the patient is [name], [name], MD  LOS - 1  Outpatient Specialists:  Chief Complaint  Patient presents with  . Allergic Reaction       Brief Narrative  78 year old male with CAD, chronic anemia, iron deficiency, receives iron infusions every 3 months, recent knee replacement surgery in February 2019, had iron transfusion on 3/29.  Patient presented to ED with dysphagia and dysarthria that started evening of 3/29, felt he was choking and his speech was slurred.  Family thought it could be due to dental problem and went to the dentist on 4/1, had the partial plate fixed with improvement and resolution of symptoms.  Patient also felt swelling on the right side of his face.  However subsequently, on 4/3 patient's symptoms restarted and he continued to choke and had slurred speech.  Patient was evaluated by PCP who started the patient on steroids as possibly an allergic reaction to the iron without significant improvement.  Patient presented to ED, initially thought to have possible CVA, evaluated by neurology.  MRI however negative for CVA.      Subjective:    [name] today is sleeping comfortably in bed.  Pt was agitated overnite, given ativan and haldol with good results.  Pt apparently has had this before. Pt states swallowing about the same.    No headache, No chest pain, No abdominal pain - No Nausea, No new weakness tingling or  numbness, No Cough - SOB.    Assessment  & Plan :    Active Problems:   CVA (cerebral vascular accident) ([place])   Dysarthria with dysphagia -Unclear etiology, per patient started after the  iron transfusion -Patient was evaluated by neurology, stroke workup negative, MRI negative for acute CVA, MRA showed no aneurysms or stenosis. - 2D echo showed EF of 60-65%, carotid Dopplers 1-39% ICA stenosis bilaterally -Patient was evaluated by speech therapy on 4/9 and observed no oral issues, aspiration with any consistency.  However was aspirating with all consistencies on 410, made n.p.o. -Esophagogram showed small aspiration but otherwise no significant abnormality, mild GERD.   -Currently on PPI -Evaluated by neurology, recommended ENT exam for unclear symptoms of dysphagia.  No clear etiology by ENT, Dr. Erik Obey, recommended CT neck, speech therapy and neurology to reconsider other causes including possible myasthenia gravis or heavy metal exposure -D/w neurology, Dr. Cheral Marker, ordered heavy metal panel, myasthenia gravis panel, recommended further workup outpatient with neurology, may include EMGs, nerve conduction studies, rule out bulbar pathology -CT soft tissue neck negative for any mass or pathology  Atypical chest pain -Troponin x1-, EKG showed no acute ST-T wave changes.  Per patient he has off and on chest pain, worse with anxiety, in the left side and radiating to right side and abdomen -CT angiogram of the chest negative for PE  BPH Continue finasteride   Code Status: Full CODE STATUS DVT Prophylaxis:  Lovenox  Family Communication: Discussed in detail with the patient, all imaging results, lab results explained to the patient, patient's wife and brother at the bedside   Disposition Plan: When patient is tolerating diet, further workup outpatient by neurology         Lab Results  Component Value Date   PLT 295 12/02/2017      Anti-infectives (From admission,  onward)   None        Objective:   Vitals:   12/03/17 2003 12/03/17 2122 12/04/17 0000 12/04/17 0500  BP:  (!) 144/73 139/79 (!) 159/86  Pulse:  (!) 59 65 (!) 58  Resp:  20 19 14   Temp: 98.3 F (36.8 C) 97.8 F (36.6 C) 97.6 F (36.4 C) (!) 96.8 F (36 C)  TempSrc: Oral Oral Oral Oral  SpO2:  96% 95% 98%  Weight:      Height:        Wt Readings from Last 3 Encounters:  12/03/17 103.1 kg (227 lb 4.7 oz)  11/26/17 108 kg (238 lb)  11/13/17 111.1 kg (245 lb)     Intake/Output Summary (Last 24 hours) at 12/04/2017 0707 Last data filed at 12/03/2017 1300 Gross per 24 hour  Intake 600 ml  Output -  Net 600 ml     Physical Exam  Awake Alert, Oriented X 1, No new F.N deficits, Normal affect Hardy.AT,PERRAL Supple Neck,No JVD, No cervical lymphadenopathy appriciated.  Symmetrical Chest wall movement, Good air movement bilaterally, CTAB RRR,No Gallops,Rubs or new Murmurs, No Parasternal Heave +ve B.Sounds, Abd Soft, No tenderness, No organomegaly appriciated, No rebound - guarding or rigidity. No Cyanosis, Clubbing or edema, No new Rash or bruise     Data Review:    CBC Recent Labs  Lab 11/30/17 1550 11/30/17 1656 12/02/17 0704  WBC 6.8  --  10.2  HGB 12.0* 12.9* 12.3*  HCT 39.2 38.0* 39.4  PLT 276  --  295  MCV 92.5  --  90.2  MCH 28.3  --  28.1  MCHC 30.6  --  31.2  RDW 17.5*  --  16.8*  LYMPHSABS 2.2  --   --   MONOABS 1.1*  --   --   EOSABS 0.1  --   --  BASOSABS 0.0  --   --     Chemistries  Recent Labs  Lab 11/30/17 1550 11/30/17 1656 12/02/17 0704  NA 139 140 138  K 3.6 3.6 3.8  CL 99* 99* 103  CO2 29  --  25  GLUCOSE 93 90 152*  BUN 17 17 17   CREATININE 0.84 0.80 0.71  CALCIUM 9.4  --  9.7  AST 22  --  19  ALT 22  --  24  ALKPHOS 66  --  67  BILITOT 0.6  --  0.5   ------------------------------------------------------------------------------------------------------------------ No results for input(s): CHOL, HDL, LDLCALC, TRIG,  CHOLHDL, LDLDIRECT in the last 72 hours.  Lab Results  Component Value Date   HGBA1C 4.6 (L) 12/01/2017   ------------------------------------------------------------------------------------------------------------------ Recent Labs    12/03/17 0937  TSH 0.249*   ------------------------------------------------------------------------------------------------------------------ Recent Labs    12/03/17 0937  VITAMINB12 1,079*  FOLATE 20.0    Coagulation profile Recent Labs  Lab 11/30/17 1550  INR 0.99    Recent Labs    12/02/17 0905  DDIMER 0.67*    Cardiac Enzymes Recent Labs  Lab 12/02/17 0905  TROPONINI <0.03   ------------------------------------------------------------------------------------------------------------------ No results found for: BNP  Inpatient Medications  Scheduled Meds: . aspirin EC  81 mg Oral Daily  . DULoxetine  60 mg Oral Daily  . enoxaparin (LOVENOX) injection  55 mg Subcutaneous Q24H  . feeding supplement  1 Container Oral TID BM  . finasteride  5 mg Oral Daily  . methylPREDNISolone (SOLU-MEDROL) injection  60 mg Intravenous Q6H  . pantoprazole  40 mg Oral BID AC  . polyvinyl alcohol  1-2 drop Both Eyes Daily  . tamsulosin  0.8 mg Oral Daily   Continuous Infusions: . dextrose 5 % and 0.45% NaCl Stopped (12/04/17 0100)   PRN Meds:.acetaminophen **OR** acetaminophen (TYLENOL) oral liquid 160 mg/5 mL **OR** acetaminophen, lidocaine, lidocaine-EPINEPHrine, LORazepam, oxymetazoline, senna-docusate, silver nitrate applicators, TRIPLE ANTIBIOTIC  Micro Results No results found for this or any previous visit (from the past 240 hour(s)).  Radiology Reports Dg Chest 2 View  Result Date: 11/30/2017 CLINICAL DATA:  Tongue and facial swelling for several days. Difficulty speaking. EXAM: CHEST - 2 VIEW COMPARISON:  08/14/2015 FINDINGS: The heart size and mediastinal contours are within normal limits. Chronic elevation of left hemidiaphragm  is unchanged. Both lungs are clear. Several old left rib fracture deformities are again seen. Left shoulder prosthesis noted. IMPRESSION: Chronic elevation of left hemidiaphragm. No active cardiopulmonary disease. Electronically Signed   By: Earle Gell M.D.   On: 11/30/2017 18:50   Ct Head Wo Contrast  Result Date: 11/30/2017 CLINICAL DATA:  Allergic reaction after iron infusion. EXAM: CT HEAD WITHOUT CONTRAST TECHNIQUE: Contiguous axial images were obtained from the base of the skull through the vertex without intravenous contrast. COMPARISON:  MRI of the head December 14, 2014 FINDINGS: BRAIN: No intraparenchymal hemorrhage, mass effect nor midline shift. Patchy to confluent supratentorial white matter hypodensities within normal range for patient's age, though non-specific are most compatible with chronic small vessel ischemic disease. Tiny RIGHT thalamus lacunar infarct versus perivascular space. No acute large vascular territory infarcts. No abnormal extra-axial fluid collections. Basal cisterns are patent. VASCULAR: Mild calcific atherosclerosis of the carotid siphons. SKULL: No skull fracture. No significant scalp soft tissue swelling. LEFT frontal scalp scarring. SINUSES/ORBITS: The mastoid air-cells and included paranasal sinuses are well-aerated.The included ocular globes and orbital contents are non-suspicious. Status post bilateral ocular lens implants. OTHER: None. IMPRESSION: 1. No acute intracranial process. 2.  Moderate to severe chronic small vessel ischemic disease. Electronically Signed   By: Elon Alas M.D.   On: 11/30/2017 17:03   Ct Soft Tissue Neck W Contrast  Result Date: 12/03/2017 CLINICAL DATA:  Dysphagia, unexplained. Tongue swelling and shortness of breath. Difficulty laying flat. EXAM: CT NECK WITH CONTRAST TECHNIQUE: Multidetector CT imaging of the neck was performed using the standard protocol following the bolus administration of intravenous contrast. CONTRAST:  76mL  ISOVUE-300 IOPAMIDOL (ISOVUE-300) INJECTION 61% COMPARISON:  None. FINDINGS: Pharynx and larynx: No visible swelling or mass to explain the history. There is motion at the level of the supraglottic larynx. Salivary glands: No inflammation, mass, or stone. Thyroid: Generous size, the isthmus measures 13 mm in thickness. No superimposed nodule. Lymph nodes: None enlarged or abnormal density. Vascular: Negative.  Mild atherosclerotic calcification. Limited intracranial: Recent brain MRI. Limited coverage is negative in the posterior fossa. Visualized orbits: Negative Mastoids and visualized paranasal sinuses: No acute finding Skeleton: Advanced spondylosis with degenerative disc narrowing in the lower cervical spine. Facet arthropathy with C3-4 and C7-T1 anterolisthesis. Upper chest: Negative Other: Motion degraded, which could obscure subtle findings. IMPRESSION: Motion degraded study without acute finding or explanation for symptoms. Electronically Signed   By: Monte Fantasia M.D.   On: 12/03/2017 14:31   Ct Angio Chest Pe W Or Wo Contrast  Result Date: 12/02/2017 CLINICAL DATA:  PE suspected, intermediate probability, positive D-dimer. Difficulty breathing when laying flat EXAM: CT ANGIOGRAPHY CHEST WITH CONTRAST TECHNIQUE: Multidetector CT imaging of the chest was performed using the standard protocol during bolus administration of intravenous contrast. Multiplanar CT image reconstructions and MIPs were obtained to evaluate the vascular anatomy. CONTRAST:  142mL ISOVUE-370 IOPAMIDOL (ISOVUE-370) INJECTION 76% COMPARISON:  Abdominal CT 03/06/2014. FINDINGS: Cardiovascular: Satisfactory opacification of the pulmonary arteries to the segmental level. No evidence of pulmonary embolism. Normal heart size. No pericardial effusion. Diffuse coronary atherosclerotic calcification. Aortic atherosclerotic calcification is mild. Mediastinum/Nodes: Negative for adenopathy. Lungs/Pleura: Chronic elevation the left diaphragm  with overlying left lower lobe multi segment collapse. Mild atelectasis at the right base. There is no edema, consolidation, effusion, or pneumothorax. Upper Abdomen: Barium in the stomach from recent esophagram. Small left renal cyst. Musculoskeletal: Extensive spondylosis with multilevel thoracic ankylosis. Advanced cervical spine spondylosis and disc degeneration where seen. Left glenohumeral arthroplasty. Right glenohumeral osteoarthritis. No acute or aggressive finding. Review of the MIP images confirms the above findings. IMPRESSION: 1. Negative for pulmonary embolism or other acute finding. 2. Chronic elevation of the left diaphragm. There is multi segment left lower lobe collapse than is greater than seen on 2015 abdominal CT. Mild atelectasis at the right base. 3.  Aortic Atherosclerosis (ICD10-I70.0).  Coronary atherosclerosis. 4. Spondylosis with multilevel thoracic ankylosis. Electronically Signed   By: Monte Fantasia M.D.   On: 12/02/2017 14:56   Mr Jeri Cos HC Contrast  Result Date: 12/02/2017 CLINICAL DATA:  Initial evaluation for acute ataxia. EXAM: MRI HEAD WITHOUT AND WITH CONTRAST MRA HEAD WITHOUT CONTRAST TECHNIQUE: Multiplanar, multiecho pulse sequences of the brain and surrounding structures were obtained without and with intravenous contrast. Angiographic images of the head were obtained using MRA technique without contrast. CONTRAST:  80mL MULTIHANCE GADOBENATE DIMEGLUMINE 529 MG/ML IV SOLN COMPARISON:  Prior CT from 11/30/2017. FINDINGS: MRI HEAD FINDINGS Brain: Study mildly degraded by motion artifact. Diffuse prominence of the CSF containing spaces is compatible with generalized age-related cerebral atrophy. Patchy and confluent T2/FLAIR hyperintensity within the periventricular and deep white matter both cerebral hemispheres most consistent with  chronic small vessel ischemic disease, moderate in nature. No abnormal foci of restricted diffusion to suggest acute or subacute ischemia.  Gray-white matter differentiation maintained. No encephalomalacia to suggest chronic infarction. No foci of susceptibility artifact to suggest acute or chronic intracranial hemorrhage. No mass lesion, midline shift or mass effect. Ventricles normal size without hydrocephalus. No extra-axial fluid collection. Major dural sinuses grossly patent. No abnormal enhancement. Pituitary gland suprasellar region normal. Midline structures intact and normal. Vascular: Major intracranial vascular flow voids are maintained. Skull and upper cervical spine: Craniocervical junction within normal limits. Upper cervical spine normal. Bone marrow signal intensity mildly decreased on T1 weighted imaging, suspected be related to patient's history of anemia. No discrete osseous lesions. Scalp soft tissues within normal limits. Sinuses/Orbits: Globes normal soft tissues within normal limits. Patient status post cataract extraction bilaterally. Few small retention cysts noted within left maxillary sinus. Paranasal sinuses are otherwise clear. No mastoid effusion. Inner ear structures normal. Other: None. MRA HEAD FINDINGS ANTERIOR CIRCULATION: Distal cervical segments of the internal carotid arteries are patent with antegrade flow. Petrous, cavernous, and supraclinoid segments patent without flow-limiting stenosis. Ophthalmic arteries patent proximally. ICA termini widely patent. A1 segments widely patent. Anterior communicating artery not well delineated. Anterior cerebral arteries widely patent to their distal aspects without stenosis. M1 segments patent without stenosis or occlusion. No proximal M2 occlusion. Distal MCA branches well perfused and symmetric. POSTERIOR CIRCULATION: Dominant right vertebral artery patent to the vertebrobasilar junction without stenosis. Partially visualized left vertebral artery hypoplastic and largely terminates in PICA. Posterior inferior cerebral arteries themselves are patent proximally. Basilar artery  widely patent to its distal aspect. Superior cerebral arteries patent bilaterally. Both of the posterior cerebral artery supplied via the basilar and are well perfused to their distal aspects without stenosis. No intracranial aneurysm. IMPRESSION: MRI HEAD IMPRESSION 1. No acute intracranial abnormality identified. 2. Generalized age-related cerebral atrophy with moderate cerebral white matter disease, nonspecific, but most commonly related to chronic small vessel ischemic changes. MRA HEAD IMPRESSION Negative intracranial MRA. No large vessel occlusion. No high-grade or correctable stenosis identified. Electronically Signed   By: Jeannine Boga M.D.   On: 12/02/2017 00:36   Dg Esophagus  Result Date: 12/02/2017 CLINICAL DATA:  Dysphagia with purees during bedside swallow study this morning. EXAM: ESOPHOGRAM / BARIUM SWALLOW / BARIUM TABLET STUDY TECHNIQUE: Combined double contrast and single contrast examination performed using effervescent crystals, thick barium liquid, and thin barium liquid. The patient was observed with fluoroscopy swallowing a 13 mm barium sulphate tablet. FLUOROSCOPY TIME:  Fluoroscopy Time:  3 minutes, 6 seconds. Radiation Exposure Index (if provided by the fluoroscopic device): 47.5 mGy. Number of Acquired Spot Images: 0 COMPARISON:  None. FINDINGS: The pharyngeal phase of swallowing demonstrated small amount of aspiration with thick barium. Mild tertiary contractions in the mid to distal esophagus. No obstruction to the forward flow of contrast throughout the esophagus and into the stomach. Normal esophageal course and contour. Normal esophageal mucosal pattern. No esophageal stricture, ulceration, or other significant abnormality. No hiatal hernia. Mild gastroesophageal reflux into the distal esophagus was elicited using the water siphon test. A 13 mm barium tablet passed without difficulty into the stomach. IMPRESSION: 1. Small aspiration with thick barium. 2. Mild nonspecific  esophageal dysmotility. 3. Mild gastroesophageal reflux. Electronically Signed   By: Titus Dubin M.D.   On: 12/02/2017 12:10   Dg Swallowing Func-speech Pathology  Result Date: 12/03/2017 Objective Swallowing Evaluation: Type of Study: MBS-Modified Barium Swallow Study  Patient Details Name: Lee  ABDIRIZAK [name] MRN: [id number] Date of Birth: 11-02-1940 Today's Date: 12/03/2017 Time: SLP Start Time (ACUTE ONLY): 5852 -SLP Stop Time (ACUTE ONLY): 0850 SLP Time Calculation (min) (ACUTE ONLY): 28 min Past Medical History: Past Medical History: Diagnosis Date . Anemia  . Blood transfusion without reported diagnosis 02/23/2014  3 units for iron deficiency anemia. as a baby - whenhe had pneumonia . BPH (benign prostatic hyperplasia)  . CTS (carpal tunnel syndrome)   better . Dyspnea  . GERD (gastroesophageal reflux disease)  . Hyperlipidemia  . Hypertension   "THEY DIAGNOSED ME YEARS AGO BUT LATELY IVE ACTUALLY HAD LOW BLOOD PRESSURES "  . Insomnia  . LBP (low back pain)  . Osteoarthritis  . Pneumonia   as a baby . Sleep apnea   no cpap Past Surgical History: Past Surgical History: Procedure Laterality Date . CARDIAC CATHETERIZATION N/A 08/15/2015  Procedure: Left Heart Cath and Coronary Angiography;  Surgeon: Lorretta Harp, MD;  Location: [facility];  Service: Cardiovascular;  Laterality: N/A; . CARPAL TUNNEL RELEASE Bilateral 1996, 2004  Dr Lenoard Aden thumb surgery.  more than once . COLONOSCOPY  2005, 2012  diverticulosis.  Marland Kitchen ESOPHAGOGASTRODUODENOSCOPY N/A 02/23/2014  Procedure: ESOPHAGOGASTRODUODENOSCOPY (EGD);  Surgeon: Irene Shipper, MD;  Location: [facility];  Service: Endoscopy;  Laterality: N/A; . EYE SURGERY  2012  both cataracts, Lasik . LEFT KNEE ARTHROSCOPY Left 07/16/2017   [facility]  . LUMBAR FUSION    x 3 . REVERSE SHOULDER ARTHROPLASTY Left 07/04/2016  Procedure: LEFT REVERSE SHOULDER ARTHROPLASTY;  Surgeon: Netta Cedars, MD;  Location: [facility];  Service: Orthopedics;  Laterality: Left; .  TONSILLECTOMY  1946 . TOTAL KNEE ARTHROPLASTY  2003  Right . TOTAL KNEE ARTHROPLASTY Left 09/25/2017  Procedure: LEFT TOTAL KNEE ARTHROPLASTY;  Surgeon: Sydnee Cabal, MD;  Location: [facility];  Service: Orthopedics;  Laterality: Left; HPI: 77 year old male admitted 11/30/17 with stroke like symptoms, orofacial swelling. PMH significant for GERD. Head CT and CXR negative, MRI negative.  ENT saw pt and documents concern for possible heavy metal exposure or myasthenia gravis.  Pt reports sudden onset of dysphagia and tongue feeling "swollen" - he states today is better than yesterday.  Esophagram during this hospital admit showed mild dysmotility, small amount of aspiration.  Pt has been NPO due to level of dysphagia.   Subjective: pt awake in chair Assessment / Plan / Recommendation CHL IP CLINICAL IMPRESSIONS 12/03/2017 Clinical Impression Mild oral and moderately severe pharyngeal deficits with mostly motor deficits.   Decreased muscular contraction *pharyngeal constrictors, tongue base retraction results in gross pharyngeal residuals (that mix with secretions) with pt awareness.  Residuals are much more pronounced with items of increased viscocity.  He was unable to clear small peach into esophagus despite multiple dry and liquid swallows.  He finally was able to expectorate peach with cues to "hock"- fortunately effectively helping to protect his airway.  Head turn left/right nor chin tuck decrease residual accumulation.  Trace penetration noted due to residuals in pharynx spilling into open airway.  Cued cough and re=-swallow helpful.  Of note when pt reswallows - there is mild retrograde propulsion of bolus in pharynx- near pharyngeal tongue base.   Recommend pt be able to consume full liquid diet with strict precautions.  Using live monitor, verbal and visual feedback  as well as swallow precaution sign= effective strategies reinforced.  Will follow up to help mitigate dysphagia and to establish goals/exercises  dependent on source of deficits.  Pt is high  malnutrition risk due to his level of dysphagia currently.  Of note, pt reported that he felt like he was going to choke during the barium swallow study - indicative of good sensation to residuals.  He did not recall if he coughed or sensed aspiration.   SLP Visit Diagnosis Dysphagia, oropharyngeal phase (R13.12);Dysphagia, pharyngoesophageal phase (R13.14) Attention and concentration deficit following -- Frontal lobe and executive function deficit following -- Impact on safety and function Risk for inadequate nutrition/hydration;Moderate aspiration risk   CHL IP TREATMENT RECOMMENDATION 12/03/2017 Treatment Recommendations Therapy as outlined in treatment plan below   Prognosis 12/03/2017 Prognosis for Safe Diet Advancement Fair Barriers to Reach Goals Severity of deficits;Other (Comment) Barriers/Prognosis Comment -- CHL IP DIET RECOMMENDATION 12/03/2017 SLP Diet Recommendations Thin liquid;Other (Comment) Liquid Administration via Cup;Straw Medication Administration (No Data) Compensations Slow rate;Small sips/bites;Multiple dry swallows after each bite/sip;Effortful swallow;Hard cough after swallow Postural Changes Remain semi-upright after after feeds/meals (Comment);Seated upright at 90 degrees   CHL IP OTHER RECOMMENDATIONS 12/03/2017 Recommended Consults -- Oral Care Recommendations Oral care QID Other Recommendations Have oral suction available   CHL IP FOLLOW UP RECOMMENDATIONS 12/02/2017 Follow up Recommendations (No Data)   CHL IP FREQUENCY AND DURATION 12/03/2017 Speech Therapy Frequency (ACUTE ONLY) min 2x/week Treatment Duration 2 weeks      CHL IP ORAL PHASE 12/03/2017 Oral Phase Impaired Oral - Pudding Teaspoon -- Oral - Pudding Cup -- Oral - Honey Teaspoon -- Oral - Honey Cup -- Oral - Nectar Teaspoon Reduced posterior propulsion Oral - Nectar Cup Lingual/palatal residue Oral - Nectar Straw -- Oral - Thin Teaspoon Reduced posterior propulsion;Lingual/palatal  residue Oral - Thin Cup Reduced posterior propulsion;Lingual/palatal residue Oral - Thin Straw Lingual/palatal residue Oral - Puree -- Oral - Mech Soft Reduced posterior propulsion;Lingual/palatal residue Oral - Regular -- Oral - Multi-Consistency -- Oral - Pill -- Oral Phase - Comment posterior lingual residuals coating tongue  CHL IP PHARYNGEAL PHASE 12/03/2017 Pharyngeal Phase Impaired Pharyngeal- Pudding Teaspoon -- Pharyngeal -- Pharyngeal- Pudding Cup -- Pharyngeal -- Pharyngeal- Honey Teaspoon -- Pharyngeal -- Pharyngeal- Honey Cup -- Pharyngeal -- Pharyngeal- Nectar Teaspoon Reduced pharyngeal peristalsis;Reduced tongue base retraction;Pharyngeal residue - valleculae;Pharyngeal residue - pyriform;Reduced epiglottic inversion;Pharyngeal residue - cp segment;Pharyngeal residue - posterior pharnyx Pharyngeal -- Pharyngeal- Nectar Cup Reduced pharyngeal peristalsis;Reduced tongue base retraction;Reduced epiglottic inversion;Pharyngeal residue - valleculae;Pharyngeal residue - pyriform;Pharyngeal residue - cp segment;Pharyngeal residue - posterior pharnyx Pharyngeal -- Pharyngeal- Nectar Straw -- Pharyngeal -- Pharyngeal- Thin Teaspoon Reduced pharyngeal peristalsis;Reduced epiglottic inversion;Reduced tongue base retraction;Pharyngeal residue - valleculae;Pharyngeal residue - pyriform;Pharyngeal residue - cp segment Pharyngeal -- Pharyngeal- Thin Cup Reduced pharyngeal peristalsis;Reduced epiglottic inversion;Pharyngeal residue - valleculae;Pharyngeal residue - pyriform;Reduced tongue base retraction Pharyngeal -- Pharyngeal- Thin Straw Reduced pharyngeal peristalsis;Reduced epiglottic inversion;Pharyngeal residue - valleculae;Pharyngeal residue - pyriform;Reduced tongue base retraction Pharyngeal -- Pharyngeal- Puree -- Pharyngeal -- Pharyngeal- Mechanical Soft Reduced pharyngeal peristalsis;Reduced epiglottic inversion;Pharyngeal residue - valleculae;Reduced tongue base retraction Pharyngeal -- Pharyngeal-  Regular -- Pharyngeal -- Pharyngeal- Multi-consistency -- Pharyngeal -- Pharyngeal- Pill -- Pharyngeal -- Pharyngeal Comment dry swallows helpful to decrease pharyngeal residuals of liquids, pt unable to clear peach from vallecular space with dry swallows or liquid consumption - he was able to expectorate with verbal cue, penetration of liquids occur due to residuals spilling itnto open airway - no aspiration observed, cued cough/throat clear effective to clear penetrates, head turn left/right and chin tuck postures not effective to decrease residuals  CHL IP CERVICAL ESOPHAGEAL PHASE 12/03/2017 Cervical Esophageal Phase Impaired Pudding Teaspoon -- Pudding Cup --  Honey Teaspoon -- Honey Cup -- Nectar Teaspoon Reduced cricopharyngeal relaxation Nectar Cup Reduced cricopharyngeal relaxation Nectar Straw -- Thin Teaspoon Reduced cricopharyngeal relaxation Thin Cup Reduced cricopharyngeal relaxation Thin Straw Reduced cricopharyngeal relaxation Puree -- Mechanical Soft Reduced cricopharyngeal relaxation Regular -- Multi-consistency -- Pill -- Cervical Esophageal Comment -- [name], MS [facility] SLP [phone]              Mr Jodene Nam Head Wo Contrast  Result Date: 12/02/2017 CLINICAL DATA:  Initial evaluation for acute ataxia. EXAM: MRI HEAD WITHOUT AND WITH CONTRAST MRA HEAD WITHOUT CONTRAST TECHNIQUE: Multiplanar, multiecho pulse sequences of the brain and surrounding structures were obtained without and with intravenous contrast. Angiographic images of the head were obtained using MRA technique without contrast. CONTRAST:  24mL MULTIHANCE GADOBENATE DIMEGLUMINE 529 MG/ML IV SOLN COMPARISON:  Prior CT from 11/30/2017. FINDINGS: MRI HEAD FINDINGS Brain: Study mildly degraded by motion artifact. Diffuse prominence of the CSF containing spaces is compatible with generalized age-related cerebral atrophy. Patchy and confluent T2/FLAIR hyperintensity within the periventricular and deep white matter both cerebral hemispheres  most consistent with chronic small vessel ischemic disease, moderate in nature. No abnormal foci of restricted diffusion to suggest acute or subacute ischemia. Gray-white matter differentiation maintained. No encephalomalacia to suggest chronic infarction. No foci of susceptibility artifact to suggest acute or chronic intracranial hemorrhage. No mass lesion, midline shift or mass effect. Ventricles normal size without hydrocephalus. No extra-axial fluid collection. Major dural sinuses grossly patent. No abnormal enhancement. Pituitary gland suprasellar region normal. Midline structures intact and normal. Vascular: Major intracranial vascular flow voids are maintained. Skull and upper cervical spine: Craniocervical junction within normal limits. Upper cervical spine normal. Bone marrow signal intensity mildly decreased on T1 weighted imaging, suspected be related to patient's history of anemia. No discrete osseous lesions. Scalp soft tissues within normal limits. Sinuses/Orbits: Globes normal soft tissues within normal limits. Patient status post cataract extraction bilaterally. Few small retention cysts noted within left maxillary sinus. Paranasal sinuses are otherwise clear. No mastoid effusion. Inner ear structures normal. Other: None. MRA HEAD FINDINGS ANTERIOR CIRCULATION: Distal cervical segments of the internal carotid arteries are patent with antegrade flow. Petrous, cavernous, and supraclinoid segments patent without flow-limiting stenosis. Ophthalmic arteries patent proximally. ICA termini widely patent. A1 segments widely patent. Anterior communicating artery not well delineated. Anterior cerebral arteries widely patent to their distal aspects without stenosis. M1 segments patent without stenosis or occlusion. No proximal M2 occlusion. Distal MCA branches well perfused and symmetric. POSTERIOR CIRCULATION: Dominant right vertebral artery patent to the vertebrobasilar junction without stenosis. Partially  visualized left vertebral artery hypoplastic and largely terminates in PICA. Posterior inferior cerebral arteries themselves are patent proximally. Basilar artery widely patent to its distal aspect. Superior cerebral arteries patent bilaterally. Both of the posterior cerebral artery supplied via the basilar and are well perfused to their distal aspects without stenosis. No intracranial aneurysm. IMPRESSION: MRI HEAD IMPRESSION 1. No acute intracranial abnormality identified. 2. Generalized age-related cerebral atrophy with moderate cerebral white matter disease, nonspecific, but most commonly related to chronic small vessel ischemic changes. MRA HEAD IMPRESSION Negative intracranial MRA. No large vessel occlusion. No high-grade or correctable stenosis identified. Electronically Signed   By: Jeannine Boga M.D.   On: 12/02/2017 00:36    Time Spent in minutes  30   Jani Gravel M.D on 12/04/2017 at 7:07 AM  Between 7am to 7pm - Pager - [phone]  After 7pm go to [email or web address] - password [email or web address]  [facility] -  Office  [phone]     

## 2017-12-04 NOTE — Progress Notes (Addendum)
SLP Cancellation Note  Patient Details Name: [name] MRN: [id number] DOB: [birth year]   Cancelled treatment:       Reason Eval/Treat Not Completed: Other (comment)(pt currently resting was agitated last pm, will follow up, recommend follow up with SLP for dysphagia - ? HH vs OP)  Pt's friend arrived and SLP reinforced aspiration precaution/compensations with him.  His friend was present the prior day and was also educated at that time.    [name] 12/04/2017, 8:45 AM [name], [place] SLP [phone]

## 2017-12-04 NOTE — Progress Notes (Signed)
Bilateral wrist Restraints discontinued patient is sleeping without any distress.

## 2017-12-04 NOTE — Progress Notes (Signed)
At 1am patient chair alarm sounded and staff entered room patient was ambulating in room, iv fluids disconnected at iv site. Patient  stated he needed to go home when staff attempted to calm down patient became irate, stating he was leaving. Patient was disoriented unable to state name. Patient called wife on cell phone with no change to patient's mood. MD notified patient was given additional dose of ativan 0.5mg  per order. Patient was kicking, attempting to swing hands at staff, and using profanity. Patient was restrained by several staff members. When security officer came into room to speak with patient, calmed down enough to not need restraint by staff. MD notified that patient was continuing to escalate, ativan not effective. Patient was given haldol by IM to left gluteus since iv access was lost. Patient was restrained by staff at this time and assisted to bed restraints placed on wrist, due to combativeness. Patient was placed on oxygen 4L complaint of difficulty breathing, during this time patient's speech is slurred. MD notified and patient given another dose of ativan IM right thigh. Patient finally started to calm down continued to be verbal but less fighting noted. Oxygen down to 2L. Dimmed lights and bed alarm on.

## 2017-12-05 DIAGNOSIS — R1310 Dysphagia, unspecified: Secondary | ICD-10-CM

## 2017-12-05 DIAGNOSIS — R471 Dysarthria and anarthria: Principal | ICD-10-CM

## 2017-12-05 MED ORDER — SENNOSIDES-DOCUSATE SODIUM 8.6-50 MG PO TABS: 1.0000 | ORAL_TABLET | Freq: Every evening | ORAL | Status: DC | PRN

## 2017-12-05 MED ORDER — PANTOPRAZOLE SODIUM 40 MG PO TBEC: 40.0000 mg | DELAYED_RELEASE_TABLET | Freq: Two times a day (BID) | ORAL | 0 refills | Status: DC

## 2017-12-05 NOTE — Discharge Summary (Signed)
Physician Discharge Summary  [name] OZH:[id number] DOB: Sep 16, 1940 DOA: 11/30/2017  PCP: Cassandria Anger, MD  Admit date: 11/30/2017 Discharge date: 12/05/2017  Admitted From home  disposition: Home Recommendations for Outpatient Follow-up:  1. Follow up with PCP in 1-2 weeks 2. Please obtain BMP/CBC in one week 3. Follow-up myasthenia gravis panel and heavy metal panel  Home Health none Equipment/Devices: None Discharge Condition stable CODE STATUS: Full code Diet recommendation: Regular thin liquid  Brief/Interim Summary:77 year old male with CAD, chronic anemia, iron deficiency, receives iron infusions every 3 months, recent knee replacement surgery in February 2019, had iron transfusion on 3/29.  Patient presented to ED with dysphagia and dysarthria that started evening of 3/29, felt he was choking and his speech was slurred.  Family thought it could be due to dental problem and went to the dentist on 4/1, had the partial plate fixed with improvement and resolution of symptoms.  Patient also felt swelling on the right side of his face.  However subsequently, on 4/3 patient's symptoms restarted and he continued to choke and had slurred speech.  Patient was evaluated by PCP who started the patient on steroids as possibly an allergic reaction to the iron without significant improvement.  Patient presented to ED, initially thought to have possible CVA, evaluated by neurology.  MRI however negative for CVA. 12/05/2017 patient sitting up in the recliner.  He is requesting to go home.  He has not had anything to eat yesterday or day before as he reports he did not have an appetite and he does not want to eat.  When I told him that without eating I cannot discharge him home he said he would eat today.  He did not eat what we offered but his wife brought him in muffin and coffee and he ate that without any difficulty in swallowing.  His speech is also improved.  His wife reported to the  nurse that he this is the first time he is looking better than ever in the last few days.    Discharge Diagnoses:  Active Problems:   CVA (cerebral vascular accident) [facility])   Dysphagia   Dysarthria Dysarthria with dysphagia-improved patient will be discharged home today to follow-up with neurology for further recommendations.  Follow-up on myasthenia gravis panel as well as heavy metals panel. -Unclear etiology, per patient started after the iron transfusion -Patient was evaluated by neurology, stroke workup negative, MRI negative for acute CVA, MRA showed no aneurysms or stenosis. - 2D echo showed EF of 60-65%, carotid Dopplers 1-39% ICA stenosis bilaterally -Patient was evaluated by speech therapy on 4/9 and observed no oral issues, aspiration with any consistency.  However was aspirating with all consistencies on 410, made n.p.o. -Esophagogram showed small aspiration but otherwise no significant abnormality, mild GERD.    I will stop the twice a day PPI.  And continue Zantac as he was taking at home.- -Evaluated by neurology, recommended ENT exam for unclear symptoms of dysphagia.  No clear etiology by ENT, Dr. Erik Obey, recommended CT neck, speech therapy and neurology to reconsider other causes including possible myasthenia gravis or heavy metal exposure -D/w neurology, Dr. Cheral Marker, ordered heavy metal panel, myasthenia gravis panel, recommended further workup outpatient with neurology, may include EMGs, nerve conduction studies, rule out bulbar pathology -CT soft tissue neck negative for any mass or pathology   Atypical chest pain -Troponin x1-, EKG showed no acute ST-T wave changes.  Per patient he has off and on chest pain, worse with  anxiety, in the left side and radiating to right side and abdomen -CT angiogram of the chest negative for PE  BPH Continue finasteride       Discharge Instructions  Discharge Instructions    Call MD for:  difficulty breathing, headache or  visual disturbances   Complete by:  As directed    Call MD for:  persistant dizziness or light-headedness   Complete by:  As directed    Call MD for:  persistant nausea and vomiting   Complete by:  As directed    Call MD for:  severe uncontrolled pain   Complete by:  As directed    Call MD for:  temperature >100.4   Complete by:  As directed    Diet - low sodium heart healthy   Complete by:  As directed    Increase activity slowly   Complete by:  As directed      Allergies as of 12/05/2017   No Known Allergies     Medication List    TAKE these medications   aspirin EC 81 MG tablet Take 1 tablet (81 mg total) by mouth daily.   CO Q-10 PO Take 1 tablet by mouth at bedtime.   DULoxetine 60 MG capsule Commonly known as:  CYMBALTA TAKE 1 CAPSULE BY MOUTH  DAILY   finasteride 5 MG tablet Commonly known as:  PROSCAR TAKE 1 TABLET BY MOUTH  DAILY What changed:    how much to take  how to take this  when to take this   folic acid 630 MCG tablet Commonly known as:  V-R FOLIC ACID Take 1 tablet (400 mcg total) by mouth daily. What changed:  when to take this   furosemide 20 MG tablet Commonly known as:  LASIX TAKE 1 TO 2 TABLETS BY  MOUTH DAILY AS NEEDED FOR  EDEMA What changed:  See the new instructions.   LUBRICANT EYE DROPS 0.4-0.3 % Soln Generic drug:  Polyethyl Glycol-Propyl Glycol Place 1-2 drops into both eyes daily.   pravastatin 20 MG tablet Commonly known as:  PRAVACHOL Take 1 tablet (20 mg total) by mouth daily. What changed:  when to take this   predniSONE 20 MG tablet Commonly known as:  DELTASONE Take 2 tablets (40 mg total) by mouth daily with breakfast.   ranitidine 150 MG tablet Commonly known as:  ZANTAC Take 1 tablet (150 mg total) by mouth 2 (two) times daily. What changed:  when to take this   senna-docusate 8.6-50 MG tablet Commonly known as:  Senokot-S Take 1 tablet by mouth at bedtime as needed for mild constipation.   tamsulosin  0.4 MG Caps capsule Commonly known as:  FLOMAX TAKE 2 CAPSULES BY MOUTH  DAILY   Vitamin B-12 1000 MCG Subl Place 1 tablet (1,000 mcg total) under the tongue daily. What changed:  when to take this   vitamin C 500 MG tablet Commonly known as:  ASCORBIC ACID Take 1 tablet (500 mg total) by mouth daily. What changed:  when to take this   Vitamin D3 2000 units Tabs Take 2,000 Units by mouth at bedtime.       No Known Allergies  Consultations neuro  Procedures/Studies: Dg Chest 2 View  Result Date: 11/30/2017 CLINICAL DATA:  Tongue and facial swelling for several days. Difficulty speaking. EXAM: CHEST - 2 VIEW COMPARISON:  08/14/2015 FINDINGS: The heart size and mediastinal contours are within normal limits. Chronic elevation of left hemidiaphragm is unchanged. Both lungs are clear. Several  old left rib fracture deformities are again seen. Left shoulder prosthesis noted. IMPRESSION: Chronic elevation of left hemidiaphragm. No active cardiopulmonary disease. Electronically Signed   By: Earle Gell M.D.   On: 11/30/2017 18:50   Ct Head Wo Contrast  Result Date: 11/30/2017 CLINICAL DATA:  Allergic reaction after iron infusion. EXAM: CT HEAD WITHOUT CONTRAST TECHNIQUE: Contiguous axial images were obtained from the base of the skull through the vertex without intravenous contrast. COMPARISON:  MRI of the head December 14, 2014 FINDINGS: BRAIN: No intraparenchymal hemorrhage, mass effect nor midline shift. Patchy to confluent supratentorial white matter hypodensities within normal range for patient's age, though non-specific are most compatible with chronic small vessel ischemic disease. Tiny RIGHT thalamus lacunar infarct versus perivascular space. No acute large vascular territory infarcts. No abnormal extra-axial fluid collections. Basal cisterns are patent. VASCULAR: Mild calcific atherosclerosis of the carotid siphons. SKULL: No skull fracture. No significant scalp soft tissue swelling. LEFT  frontal scalp scarring. SINUSES/ORBITS: The mastoid air-cells and included paranasal sinuses are well-aerated.The included ocular globes and orbital contents are non-suspicious. Status post bilateral ocular lens implants. OTHER: None. IMPRESSION: 1. No acute intracranial process. 2. Moderate to severe chronic small vessel ischemic disease. Electronically Signed   By: Elon Alas M.D.   On: 11/30/2017 17:03   Ct Soft Tissue Neck W Contrast  Result Date: 12/03/2017 CLINICAL DATA:  Dysphagia, unexplained. Tongue swelling and shortness of breath. Difficulty laying flat. EXAM: CT NECK WITH CONTRAST TECHNIQUE: Multidetector CT imaging of the neck was performed using the standard protocol following the bolus administration of intravenous contrast. CONTRAST:  27mL ISOVUE-300 IOPAMIDOL (ISOVUE-300) INJECTION 61% COMPARISON:  None. FINDINGS: Pharynx and larynx: No visible swelling or mass to explain the history. There is motion at the level of the supraglottic larynx. Salivary glands: No inflammation, mass, or stone. Thyroid: Generous size, the isthmus measures 13 mm in thickness. No superimposed nodule. Lymph nodes: None enlarged or abnormal density. Vascular: Negative.  Mild atherosclerotic calcification. Limited intracranial: Recent brain MRI. Limited coverage is negative in the posterior fossa. Visualized orbits: Negative Mastoids and visualized paranasal sinuses: No acute finding Skeleton: Advanced spondylosis with degenerative disc narrowing in the lower cervical spine. Facet arthropathy with C3-4 and C7-T1 anterolisthesis. Upper chest: Negative Other: Motion degraded, which could obscure subtle findings. IMPRESSION: Motion degraded study without acute finding or explanation for symptoms. Electronically Signed   By: Monte Fantasia M.D.   On: 12/03/2017 14:31   Ct Angio Chest Pe W Or Wo Contrast  Result Date: 12/02/2017 CLINICAL DATA:  PE suspected, intermediate probability, positive D-dimer. Difficulty  breathing when laying flat EXAM: CT ANGIOGRAPHY CHEST WITH CONTRAST TECHNIQUE: Multidetector CT imaging of the chest was performed using the standard protocol during bolus administration of intravenous contrast. Multiplanar CT image reconstructions and MIPs were obtained to evaluate the vascular anatomy. CONTRAST:  149mL ISOVUE-370 IOPAMIDOL (ISOVUE-370) INJECTION 76% COMPARISON:  Abdominal CT 03/06/2014. FINDINGS: Cardiovascular: Satisfactory opacification of the pulmonary arteries to the segmental level. No evidence of pulmonary embolism. Normal heart size. No pericardial effusion. Diffuse coronary atherosclerotic calcification. Aortic atherosclerotic calcification is mild. Mediastinum/Nodes: Negative for adenopathy. Lungs/Pleura: Chronic elevation the left diaphragm with overlying left lower lobe multi segment collapse. Mild atelectasis at the right base. There is no edema, consolidation, effusion, or pneumothorax. Upper Abdomen: Barium in the stomach from recent esophagram. Small left renal cyst. Musculoskeletal: Extensive spondylosis with multilevel thoracic ankylosis. Advanced cervical spine spondylosis and disc degeneration where seen. Left glenohumeral arthroplasty. Right glenohumeral osteoarthritis. No acute or  aggressive finding. Review of the MIP images confirms the above findings. IMPRESSION: 1. Negative for pulmonary embolism or other acute finding. 2. Chronic elevation of the left diaphragm. There is multi segment left lower lobe collapse than is greater than seen on 2015 abdominal CT. Mild atelectasis at the right base. 3.  Aortic Atherosclerosis (ICD10-I70.0).  Coronary atherosclerosis. 4. Spondylosis with multilevel thoracic ankylosis. Electronically Signed   By: Monte Fantasia M.D.   On: 12/02/2017 14:56   Lee Jeri Cos YB Contrast  Result Date: 12/02/2017 CLINICAL DATA:  Initial evaluation for acute ataxia. EXAM: MRI HEAD WITHOUT AND WITH CONTRAST MRA HEAD WITHOUT CONTRAST TECHNIQUE:  Multiplanar, multiecho pulse sequences of the brain and surrounding structures were obtained without and with intravenous contrast. Angiographic images of the head were obtained using MRA technique without contrast. CONTRAST:  35mL MULTIHANCE GADOBENATE DIMEGLUMINE 529 MG/ML IV SOLN COMPARISON:  Prior CT from 11/30/2017. FINDINGS: MRI HEAD FINDINGS Brain: Study mildly degraded by motion artifact. Diffuse prominence of the CSF containing spaces is compatible with generalized age-related cerebral atrophy. Patchy and confluent T2/FLAIR hyperintensity within the periventricular and deep white matter both cerebral hemispheres most consistent with chronic small vessel ischemic disease, moderate in nature. No abnormal foci of restricted diffusion to suggest acute or subacute ischemia. Gray-white matter differentiation maintained. No encephalomalacia to suggest chronic infarction. No foci of susceptibility artifact to suggest acute or chronic intracranial hemorrhage. No mass lesion, midline shift or mass effect. Ventricles normal size without hydrocephalus. No extra-axial fluid collection. Major dural sinuses grossly patent. No abnormal enhancement. Pituitary gland suprasellar region normal. Midline structures intact and normal. Vascular: Major intracranial vascular flow voids are maintained. Skull and upper cervical spine: Craniocervical junction within normal limits. Upper cervical spine normal. Bone marrow signal intensity mildly decreased on T1 weighted imaging, suspected be related to patient's history of anemia. No discrete osseous lesions. Scalp soft tissues within normal limits. Sinuses/Orbits: Globes normal soft tissues within normal limits. Patient status post cataract extraction bilaterally. Few small retention cysts noted within left maxillary sinus. Paranasal sinuses are otherwise clear. No mastoid effusion. Inner ear structures normal. Other: None. MRA HEAD FINDINGS ANTERIOR CIRCULATION: Distal cervical  segments of the internal carotid arteries are patent with antegrade flow. Petrous, cavernous, and supraclinoid segments patent without flow-limiting stenosis. Ophthalmic arteries patent proximally. ICA termini widely patent. A1 segments widely patent. Anterior communicating artery not well delineated. Anterior cerebral arteries widely patent to their distal aspects without stenosis. M1 segments patent without stenosis or occlusion. No proximal M2 occlusion. Distal MCA branches well perfused and symmetric. POSTERIOR CIRCULATION: Dominant right vertebral artery patent to the vertebrobasilar junction without stenosis. Partially visualized left vertebral artery hypoplastic and largely terminates in PICA. Posterior inferior cerebral arteries themselves are patent proximally. Basilar artery widely patent to its distal aspect. Superior cerebral arteries patent bilaterally. Both of the posterior cerebral artery supplied via the basilar and are well perfused to their distal aspects without stenosis. No intracranial aneurysm. IMPRESSION: MRI HEAD IMPRESSION 1. No acute intracranial abnormality identified. 2. Generalized age-related cerebral atrophy with moderate cerebral white matter disease, nonspecific, but most commonly related to chronic small vessel ischemic changes. MRA HEAD IMPRESSION Negative intracranial MRA. No large vessel occlusion. No high-grade or correctable stenosis identified. Electronically Signed   By: Jeannine Boga M.D.   On: 12/02/2017 00:36   Dg Esophagus  Result Date: 12/02/2017 CLINICAL DATA:  Dysphagia with purees during bedside swallow study this morning. EXAM: ESOPHOGRAM / BARIUM SWALLOW / BARIUM TABLET STUDY TECHNIQUE: Combined double  contrast and single contrast examination performed using effervescent crystals, thick barium liquid, and thin barium liquid. The patient was observed with fluoroscopy swallowing a 13 mm barium sulphate tablet. FLUOROSCOPY TIME:  Fluoroscopy Time:  3  minutes, 6 seconds. Radiation Exposure Index (if provided by the fluoroscopic device): 47.5 mGy. Number of Acquired Spot Images: 0 COMPARISON:  None. FINDINGS: The pharyngeal phase of swallowing demonstrated small amount of aspiration with thick barium. Mild tertiary contractions in the mid to distal esophagus. No obstruction to the forward flow of contrast throughout the esophagus and into the stomach. Normal esophageal course and contour. Normal esophageal mucosal pattern. No esophageal stricture, ulceration, or other significant abnormality. No hiatal hernia. Mild gastroesophageal reflux into the distal esophagus was elicited using the water siphon test. A 13 mm barium tablet passed without difficulty into the stomach. IMPRESSION: 1. Small aspiration with thick barium. 2. Mild nonspecific esophageal dysmotility. 3. Mild gastroesophageal reflux. Electronically Signed   By: Titus Dubin M.D.   On: 12/02/2017 12:10   Dg Swallowing Func-speech Pathology  Result Date: 12/03/2017 Objective Swallowing Evaluation: Type of Study: MBS-Modified Barium Swallow Study  Patient Details Name: WAHID HOLLEY MRN: [id number] Date of Birth: 1941/01/03 Today's Date: 12/03/2017 Time: SLP Start Time (ACUTE ONLY): 0973 -SLP Stop Time (ACUTE ONLY): 0850 SLP Time Calculation (min) (ACUTE ONLY): 28 min Past Medical History: Past Medical History: Diagnosis Date . Anemia  . Blood transfusion without reported diagnosis 02/23/2014  3 units for iron deficiency anemia. as a baby - whenhe had pneumonia . BPH (benign prostatic hyperplasia)  . CTS (carpal tunnel syndrome)   better . Dyspnea  . GERD (gastroesophageal reflux disease)  . Hyperlipidemia  . Hypertension   "THEY DIAGNOSED ME YEARS AGO BUT LATELY IVE ACTUALLY HAD LOW BLOOD PRESSURES "  . Insomnia  . LBP (low back pain)  . Osteoarthritis  . Pneumonia   as a baby . Sleep apnea   no cpap Past Surgical History: Past Surgical History: Procedure Laterality Date . CARDIAC  CATHETERIZATION N/A 08/15/2015  Procedure: Left Heart Cath and Coronary Angiography;  Surgeon: Lorretta Harp, MD;  Location: [facility];  Service: Cardiovascular;  Laterality: N/A; . CARPAL TUNNEL RELEASE Bilateral 1996, 2004  Dr Lenoard Aden thumb surgery.  more than once . COLONOSCOPY  2005, 2012  diverticulosis.  Marland Kitchen ESOPHAGOGASTRODUODENOSCOPY N/A 02/23/2014  Procedure: ESOPHAGOGASTRODUODENOSCOPY (EGD);  Surgeon: Irene Shipper, MD;  Location: [facility];  Service: Endoscopy;  Laterality: N/A; . EYE SURGERY  2012  both cataracts, Lasik . LEFT KNEE ARTHROSCOPY Left 07/16/2017   [facility]  . LUMBAR FUSION    x 3 . REVERSE SHOULDER ARTHROPLASTY Left 07/04/2016  Procedure: LEFT REVERSE SHOULDER ARTHROPLASTY;  Surgeon: Netta Cedars, MD;  Location: [place];  Service: Orthopedics;  Laterality: Left; . TONSILLECTOMY  1946 . TOTAL KNEE ARTHROPLASTY  2003  Right . TOTAL KNEE ARTHROPLASTY Left 09/25/2017  Procedure: LEFT TOTAL KNEE ARTHROPLASTY;  Surgeon: Sydnee Cabal, MD;  Location: [facility];  Service: Orthopedics;  Laterality: Left; HPI: 77 year old male admitted 11/30/17 with stroke like symptoms, orofacial swelling. PMH significant for GERD. Head CT and CXR negative, MRI negative.  ENT saw pt and documents concern for possible heavy metal exposure or myasthenia gravis.  Pt reports sudden onset of dysphagia and tongue feeling "swollen" - he states today is better than yesterday.  Esophagram during this hospital admit showed mild dysmotility, small amount of aspiration.  Pt has been NPO due to level of dysphagia.   Subjective:  pt awake in chair Assessment / Plan / Recommendation CHL IP CLINICAL IMPRESSIONS 12/03/2017 Clinical Impression Mild oral and moderately severe pharyngeal deficits with mostly motor deficits.   Decreased muscular contraction *pharyngeal constrictors, tongue base retraction results in gross pharyngeal residuals (that mix with secretions) with pt awareness.  Residuals are much more pronounced with items  of increased viscocity.  He was unable to clear small peach into esophagus despite multiple dry and liquid swallows.  He finally was able to expectorate peach with cues to "hock"- fortunately effectively helping to protect his airway.  Head turn left/right nor chin tuck decrease residual accumulation.  Trace penetration noted due to residuals in pharynx spilling into open airway.  Cued cough and re=-swallow helpful.  Of note when pt reswallows - there is mild retrograde propulsion of bolus in pharynx- near pharyngeal tongue base.   Recommend pt be able to consume full liquid diet with strict precautions.  Using live monitor, verbal and visual feedback  as well as swallow precaution sign= effective strategies reinforced.  Will follow up to help mitigate dysphagia and to establish goals/exercises dependent on source of deficits.  Pt is high malnutrition risk due to his level of dysphagia currently.  Of note, pt reported that he felt like he was going to choke during the barium swallow study - indicative of good sensation to residuals.  He did not recall if he coughed or sensed aspiration.   SLP Visit Diagnosis Dysphagia, oropharyngeal phase (R13.12);Dysphagia, pharyngoesophageal phase (R13.14) Attention and concentration deficit following -- Frontal lobe and executive function deficit following -- Impact on safety and function Risk for inadequate nutrition/hydration;Moderate aspiration risk   CHL IP TREATMENT RECOMMENDATION 12/03/2017 Treatment Recommendations Therapy as outlined in treatment plan below   Prognosis 12/03/2017 Prognosis for Safe Diet Advancement Fair Barriers to Reach Goals Severity of deficits;Other (Comment) Barriers/Prognosis Comment -- CHL IP DIET RECOMMENDATION 12/03/2017 SLP Diet Recommendations Thin liquid;Other (Comment) Liquid Administration via Cup;Straw Medication Administration (No Data) Compensations Slow rate;Small sips/bites;Multiple dry swallows after each bite/sip;Effortful swallow;Hard  cough after swallow Postural Changes Remain semi-upright after after feeds/meals (Comment);Seated upright at 90 degrees   CHL IP OTHER RECOMMENDATIONS 12/03/2017 Recommended Consults -- Oral Care Recommendations Oral care QID Other Recommendations Have oral suction available   CHL IP FOLLOW UP RECOMMENDATIONS 12/02/2017 Follow up Recommendations (No Data)   CHL IP FREQUENCY AND DURATION 12/03/2017 Speech Therapy Frequency (ACUTE ONLY) min 2x/week Treatment Duration 2 weeks      CHL IP ORAL PHASE 12/03/2017 Oral Phase Impaired Oral - Pudding Teaspoon -- Oral - Pudding Cup -- Oral - Honey Teaspoon -- Oral - Honey Cup -- Oral - Nectar Teaspoon Reduced posterior propulsion Oral - Nectar Cup Lingual/palatal residue Oral - Nectar Straw -- Oral - Thin Teaspoon Reduced posterior propulsion;Lingual/palatal residue Oral - Thin Cup Reduced posterior propulsion;Lingual/palatal residue Oral - Thin Straw Lingual/palatal residue Oral - Puree -- Oral - Mech Soft Reduced posterior propulsion;Lingual/palatal residue Oral - Regular -- Oral - Multi-Consistency -- Oral - Pill -- Oral Phase - Comment posterior lingual residuals coating tongue  CHL IP PHARYNGEAL PHASE 12/03/2017 Pharyngeal Phase Impaired Pharyngeal- Pudding Teaspoon -- Pharyngeal -- Pharyngeal- Pudding Cup -- Pharyngeal -- Pharyngeal- Honey Teaspoon -- Pharyngeal -- Pharyngeal- Honey Cup -- Pharyngeal -- Pharyngeal- Nectar Teaspoon Reduced pharyngeal peristalsis;Reduced tongue base retraction;Pharyngeal residue - valleculae;Pharyngeal residue - pyriform;Reduced epiglottic inversion;Pharyngeal residue - cp segment;Pharyngeal residue - posterior pharnyx Pharyngeal -- Pharyngeal- Nectar Cup Reduced pharyngeal peristalsis;Reduced tongue base retraction;Reduced epiglottic inversion;Pharyngeal residue - valleculae;Pharyngeal residue - pyriform;Pharyngeal  residue - cp segment;Pharyngeal residue - posterior pharnyx Pharyngeal -- Pharyngeal- Nectar Straw -- Pharyngeal -- Pharyngeal-  Thin Teaspoon Reduced pharyngeal peristalsis;Reduced epiglottic inversion;Reduced tongue base retraction;Pharyngeal residue - valleculae;Pharyngeal residue - pyriform;Pharyngeal residue - cp segment Pharyngeal -- Pharyngeal- Thin Cup Reduced pharyngeal peristalsis;Reduced epiglottic inversion;Pharyngeal residue - valleculae;Pharyngeal residue - pyriform;Reduced tongue base retraction Pharyngeal -- Pharyngeal- Thin Straw Reduced pharyngeal peristalsis;Reduced epiglottic inversion;Pharyngeal residue - valleculae;Pharyngeal residue - pyriform;Reduced tongue base retraction Pharyngeal -- Pharyngeal- Puree -- Pharyngeal -- Pharyngeal- Mechanical Soft Reduced pharyngeal peristalsis;Reduced epiglottic inversion;Pharyngeal residue - valleculae;Reduced tongue base retraction Pharyngeal -- Pharyngeal- Regular -- Pharyngeal -- Pharyngeal- Multi-consistency -- Pharyngeal -- Pharyngeal- Pill -- Pharyngeal -- Pharyngeal Comment dry swallows helpful to decrease pharyngeal residuals of liquids, pt unable to clear peach from vallecular space with dry swallows or liquid consumption - he was able to expectorate with verbal cue, penetration of liquids occur due to residuals spilling itnto open airway - no aspiration observed, cued cough/throat clear effective to clear penetrates, head turn left/right and chin tuck postures not effective to decrease residuals  CHL IP CERVICAL ESOPHAGEAL PHASE 12/03/2017 Cervical Esophageal Phase Impaired Pudding Teaspoon -- Pudding Cup -- Honey Teaspoon -- Honey Cup -- Nectar Teaspoon Reduced cricopharyngeal relaxation Nectar Cup Reduced cricopharyngeal relaxation Nectar Straw -- Thin Teaspoon Reduced cricopharyngeal relaxation Thin Cup Reduced cricopharyngeal relaxation Thin Straw Reduced cricopharyngeal relaxation Puree -- Mechanical Soft Reduced cricopharyngeal relaxation Regular -- Multi-consistency -- Pill -- Cervical Esophageal Comment -- [name], MS [facility] SLP [phone]              [name]  Head Wo Contrast  Result Date: 12/02/2017 CLINICAL DATA:  Initial evaluation for acute ataxia. EXAM: MRI HEAD WITHOUT AND WITH CONTRAST MRA HEAD WITHOUT CONTRAST TECHNIQUE: Multiplanar, multiecho pulse sequences of the brain and surrounding structures were obtained without and with intravenous contrast. Angiographic images of the head were obtained using MRA technique without contrast. CONTRAST:  62mL MULTIHANCE GADOBENATE DIMEGLUMINE 529 MG/ML IV SOLN COMPARISON:  Prior CT from 11/30/2017. FINDINGS: MRI HEAD FINDINGS Brain: Study mildly degraded by motion artifact. Diffuse prominence of the CSF containing spaces is compatible with generalized age-related cerebral atrophy. Patchy and confluent T2/FLAIR hyperintensity within the periventricular and deep white matter both cerebral hemispheres most consistent with chronic small vessel ischemic disease, moderate in nature. No abnormal foci of restricted diffusion to suggest acute or subacute ischemia. Gray-white matter differentiation maintained. No encephalomalacia to suggest chronic infarction. No foci of susceptibility artifact to suggest acute or chronic intracranial hemorrhage. No mass lesion, midline shift or mass effect. Ventricles normal size without hydrocephalus. No extra-axial fluid collection. Major dural sinuses grossly patent. No abnormal enhancement. Pituitary gland suprasellar region normal. Midline structures intact and normal. Vascular: Major intracranial vascular flow voids are maintained. Skull and upper cervical spine: Craniocervical junction within normal limits. Upper cervical spine normal. Bone marrow signal intensity mildly decreased on T1 weighted imaging, suspected be related to patient's history of anemia. No discrete osseous lesions. Scalp soft tissues within normal limits. Sinuses/Orbits: Globes normal soft tissues within normal limits. Patient status post cataract extraction bilaterally. Few small retention cysts noted within left  maxillary sinus. Paranasal sinuses are otherwise clear. No mastoid effusion. Inner ear structures normal. Other: None. MRA HEAD FINDINGS ANTERIOR CIRCULATION: Distal cervical segments of the internal carotid arteries are patent with antegrade flow. Petrous, cavernous, and supraclinoid segments patent without flow-limiting stenosis. Ophthalmic arteries patent proximally. ICA termini widely patent. A1 segments widely patent. Anterior communicating artery not well delineated. Anterior cerebral arteries  widely patent to their distal aspects without stenosis. M1 segments patent without stenosis or occlusion. No proximal M2 occlusion. Distal MCA branches well perfused and symmetric. POSTERIOR CIRCULATION: Dominant right vertebral artery patent to the vertebrobasilar junction without stenosis. Partially visualized left vertebral artery hypoplastic and largely terminates in PICA. Posterior inferior cerebral arteries themselves are patent proximally. Basilar artery widely patent to its distal aspect. Superior cerebral arteries patent bilaterally. Both of the posterior cerebral artery supplied via the basilar and are well perfused to their distal aspects without stenosis. No intracranial aneurysm. IMPRESSION: MRI HEAD IMPRESSION 1. No acute intracranial abnormality identified. 2. Generalized age-related cerebral atrophy with moderate cerebral white matter disease, nonspecific, but most commonly related to chronic small vessel ischemic changes. MRA HEAD IMPRESSION Negative intracranial MRA. No large vessel occlusion. No high-grade or correctable stenosis identified. Electronically Signed   By: Jeannine Boga M.D.   On: 12/02/2017 00:36    (Echo, Carotid, EGD, Colonoscopy, ERCP)    Subjective:   Discharge Exam: Vitals:   12/04/17 2041 12/05/17 0453  BP: (!) 154/83 (!) 148/77  Pulse: (!) 54 (!) 52  Resp: 16 16  Temp: 97.7 F (36.5 C) (!) 97.5 F (36.4 C)  SpO2: 97% 96%   Vitals:   12/04/17 1749  12/04/17 2041 12/05/17 0327 12/05/17 0453  BP: 134/72 (!) 154/83  (!) 148/77  Pulse: (!) 57 (!) 54  (!) 52  Resp: 17 16  16   Temp: 97.6 F (36.4 C) 97.7 F (36.5 C)  (!) 97.5 F (36.4 C)  TempSrc: Oral Oral  Oral  SpO2: 98% 97%  96%  Weight:   102.6 kg (226 lb 3.1 oz)   Height:        General: Pt is alert, awake, not in acute distress Cardiovascular: RRR, S1/S2 +, no rubs, no gallops Respiratory: CTA bilaterally, no wheezing, no rhonchi Abdominal: Soft, NT, ND, bowel sounds + Extremities: no edema, no cyanosis    The results of significant diagnostics from this hospitalization (including imaging, microbiology, ancillary and laboratory) are listed below for reference.     Microbiology: No results found for this or any previous visit (from the past 240 hour(s)).   Labs: BNP (last 3 results) No results for input(s): BNP in the last 8760 hours. Basic Metabolic Panel: Recent Labs  Lab 11/30/17 1550 11/30/17 1656 12/02/17 0704  NA 139 140 138  K 3.6 3.6 3.8  CL 99* 99* 103  CO2 29  --  25  GLUCOSE 93 90 152*  BUN 17 17 17   CREATININE 0.84 0.80 0.71  CALCIUM 9.4  --  9.7   Liver Function Tests: Recent Labs  Lab 11/30/17 1550 12/02/17 0704  AST 22 19  ALT 22 24  ALKPHOS 66 67  BILITOT 0.6 0.5  PROT 6.7 6.4*  ALBUMIN 3.9 3.6   No results for input(s): LIPASE, AMYLASE in the last 168 hours. No results for input(s): AMMONIA in the last 168 hours. CBC: Recent Labs  Lab 11/30/17 1550 11/30/17 1656 12/02/17 0704  WBC 6.8  --  10.2  NEUTROABS 3.4  --   --   HGB 12.0* 12.9* 12.3*  HCT 39.2 38.0* 39.4  MCV 92.5  --  90.2  PLT 276  --  295   Cardiac Enzymes: Recent Labs  Lab 12/02/17 0905 12/03/17 1430  CKTOTAL  --  146  TROPONINI <0.03  --    BNP: Invalid input(s): POCBNP CBG: No results for input(s): GLUCAP in the last 168 hours. D-Dimer No  results for input(s): DDIMER in the last 72 hours. Hgb A1c No results for input(s): HGBA1C in the last 72  hours. Lipid Profile No results for input(s): CHOL, HDL, LDLCALC, TRIG, CHOLHDL, LDLDIRECT in the last 72 hours. Thyroid function studies Recent Labs    12/03/17 0937  TSH 0.249*   Anemia work up Recent Labs    12/03/17 0937  VITAMINB12 1,079*  FOLATE 20.0   Urinalysis    Component Value Date/Time   COLORURINE YELLOW 11/30/2017 2024   APPEARANCEUR CLEAR 11/30/2017 2024   LABSPEC 1.011 11/30/2017 2024   PHURINE 6.0 11/30/2017 2024   GLUCOSEU NEGATIVE 11/30/2017 2024   GLUCOSEU NEGATIVE 03/01/2012 1029   HGBUR NEGATIVE 11/30/2017 2024   BILIRUBINUR NEGATIVE 11/30/2017 2024   BILIRUBINUR negative 06/11/2016 1106   [place] 11/30/2017 2024   PROTEINUR NEGATIVE 11/30/2017 2024   UROBILINOGEN negative 06/11/2016 1106   UROBILINOGEN 1.0 03/07/2014 0019   NITRITE NEGATIVE 11/30/2017 2024   LEUKOCYTESUR NEGATIVE 11/30/2017 2024   Sepsis Labs Invalid input(s): PROCALCITONIN,  WBC,  LACTICIDVEN Microbiology No results found for this or any previous visit (from the past 240 hour(s)).   Time coordinating discharge: Over 30 minutes  SIGNED:   Georgette Shell, MD  [facility] 12/05/2017, 1:27 PM Pager   If 7PM-7AM, please contact night-coverage [email or web address] Password [email or web address]

## 2017-12-05 NOTE — Progress Notes (Signed)
Pt & wife given discharge instructions, prescriptions, and care notes. Pt & wife verbalized understanding AEB no further questions or concerns at this time. IV was discontinued, no redness, pain, or swelling noted at this time. Telemetry discontinued and Centralized Telemetry was notified. Pt left the floor via wheelchair with staff in stable condition.

## 2017-12-07 ENCOUNTER — Telehealth: Payer: Self-pay | Admitting: *Deleted

## 2017-12-07 NOTE — Telephone Encounter (Signed)
Transition Care Management Follow-up Telephone Call   Date discharged? 12/05/17   How have you been since you were released from the hospital? Pt states he seem to be doing fine   Do you understand why you were in the hospital? YES   Do you understand the discharge instructions? YES   Where were you discharged to? Home   Items Reviewed:  Medications reviewed: YES  Allergies reviewed: YES  Dietary changes reviewed: YES, he states he been eating soft food and drinking liquid  Referrals reviewed: No referral needed   Functional Questionnaire:   Activities of Daily Living (ADLs):   He states he are independent in the following: bathing and hygiene, feeding, continence, grooming, toileting and dressing States they require assistance with the following: ambulation   Any transportation issues/concerns?: NO   Any patient concerns? NO   Confirmed importance and date/time of follow-up visits scheduled YES, appt 12/09/17  Provider Appointment booked with Dr. Alain Marion  Confirmed with patient if condition begins to worsen call PCP or go to the ER.  Patient was given the office number and encouraged to call back with question or concerns.  : YES

## 2017-12-09 ENCOUNTER — Ambulatory Visit (INDEPENDENT_AMBULATORY_CARE_PROVIDER_SITE_OTHER): Payer: Medicare Other | Admitting: Internal Medicine

## 2017-12-09 ENCOUNTER — Encounter: Payer: Self-pay | Admitting: Internal Medicine

## 2017-12-09 VITALS — BP 124/62 | HR 75 | Temp 97.8°F | Ht 72.0 in | Wt 222.0 lb

## 2017-12-09 DIAGNOSIS — E538 Deficiency of other specified B group vitamins: Secondary | ICD-10-CM

## 2017-12-09 DIAGNOSIS — R1310 Dysphagia, unspecified: Secondary | ICD-10-CM

## 2017-12-09 DIAGNOSIS — R269 Unspecified abnormalities of gait and mobility: Secondary | ICD-10-CM

## 2017-12-09 DIAGNOSIS — R29898 Other symptoms and signs involving the musculoskeletal system: Secondary | ICD-10-CM

## 2017-12-09 LAB — HEAVY METALS PROFILE, URINE
Arsenic (Total),U: 21 ug/L (ref 0–50)
Arsenic(Inorganic),U: NOT DETECTED ug/L (ref 0–19)
Creatinine(Crt),U: 1.67 g/L (ref 0.30–3.00)
Lead, Rand Ur: NOT DETECTED ug/L (ref 0–49)
Mercury, Ur: NOT DETECTED ug/L (ref 0–19)

## 2017-12-09 MED ORDER — LORAZEPAM 1 MG PO TABS: 1.0000 mg | ORAL_TABLET | Freq: Two times a day (BID) | ORAL | 1 refills | Status: DC | PRN

## 2017-12-09 NOTE — Progress Notes (Signed)
Subjective:  Patient ID: [name], male    DOB: 1941-07-13  Age: 77 y.o. MRN: [id number]  CC: No chief complaint on file.   HPI [name] presents for post-iron infusion ("it went in too fast") related problems vs other: speech difficulty, problems w/swollowing, tongue swelling developed in 10 hrs after.  He is here for a post-hosp stay f/u C/o wt loss, fatigue, off balance, weak, no appetite. Swallowing is better by 40%   Per hx:  "CVA (cerebral vascular accident) [facility])   Dysphagia   Dysarthria Dysarthria with dysphagia-improved patient will be discharged home today to follow-up with neurology for further recommendations.  Follow-up on myasthenia gravis panel as well as heavy metals panel. -Unclear etiology, per patient started after the iron transfusion -Patient was evaluated by neurology, stroke workup negative, MRI negative for acute CVA, MRA showed no aneurysms or stenosis. - 2D echo showed EF of 60-65%, carotid Dopplers 1-39% ICA stenosis bilaterally -Patient was evaluated by speech therapy on 4/9 and observed no oral issues, aspiration with any consistency.However was aspirating with all consistencies on 410, made n.p.o. -Esophagogram showed small aspiration but otherwise no significant abnormality, mild GERD.  I will stop the twice a day PPI.  And continue Zantac as he was taking at home.- -Evaluated by neurology, recommended ENT exam for unclear symptoms of dysphagia. No clear etiology by ENT, Dr. Melissa Montane CT neck, speech therapy and neurology to reconsider other causes including possible myasthenia gravis or heavy metal exposure -D/wneurology, Dr. Cheral Marker, ordered heavy metal panel, myasthenia gravis panel, recommended further workup outpatient with neurology, may include EMGs, nerve conduction studies, rule out bulbar pathology -CT soft tissue necknegative for any mass or pathology   Atypical chest pain -Troponin x1-, EKG showed no  acute ST-T wave changes. Per patient he has off and on chest pain, worse with anxiety, in the left side and radiating to right side and abdomen -CT angiogram of the chest negative for PE  BPH Continue finasteride"  Per ENT:  "Rapidly progressive and relaxing tongue dysarthria.  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  Not amyloid.  No evidence of acute neurologic injury.    Plan:  I discussed with pt and wife.  I would ask Neurology to reconsider, and maybe Speech Path also.  Perhaps a CT neck with contrast.  "  Per Neurology: "77 y.o. male with a past medical history significant for coronary artery disease chronic anemia due to chronic blood loss and iron infusions every 3 months.  Patient presents to the ED with dysarthria and dysphasia which started on the evening of 11/11/1917 post blood transfusion.   1. Subtle right cheek through submandibular area mild edema is present on exam. Suspect possible persistent inflammatory or allergic response involving this region.  2. DDx also includes small lacunar infarction or small cortically based stroke in the region of the left frontal operculum, which could result in a speech apraxia (aphemia) resembling a true dysarthria. DDx also includes functional etiology such as secondary gain. Initial CT of the head was negative. Currently awaiting MRI of the brain.  If MRI of the brain is negative will recommend pursuing ENT workup and evaluation. 3. Stroke Risk Factors - family history, hyperlipidemia and hypertension"    Outpatient Medications Prior to Visit  Medication Sig Dispense Refill  . aspirin EC 81 MG tablet Take 1 tablet (81 mg total) by mouth daily. 100 tablet 3  .  Cholecalciferol (VITAMIN D3) 2000 units TABS Take 2,000 Units by mouth at bedtime.    . Coenzyme Q10 (CO Q-10 PO) Take 1 tablet by mouth at bedtime.    . Cyanocobalamin (VITAMIN B-12) 1000 MCG SUBL Place 1  tablet (1,000 mcg total) under the tongue daily. (Patient taking differently: Place 1,000 mcg under the tongue at bedtime. ) 100 tablet 3  . DULoxetine (CYMBALTA) 60 MG capsule TAKE 1 CAPSULE BY MOUTH  DAILY 90 capsule 1  . finasteride (PROSCAR) 5 MG tablet TAKE 1 TABLET BY MOUTH  DAILY (Patient taking differently: TAKE 1 TABLET (5 MG) BY MOUTH  DAILY AT NIGHT) 90 tablet 3  . folic acid (V-R FOLIC ACID) 924 MCG tablet Take 1 tablet (400 mcg total) by mouth daily. (Patient taking differently: Take 400 mcg by mouth at bedtime. ) 90 tablet 3  . furosemide (LASIX) 20 MG tablet TAKE 1 TO 2 TABLETS BY  MOUTH DAILY AS NEEDED FOR  EDEMA (Patient taking differently: TAKE 1 TABLET (20 MG) BY  MOUTH DAILY IN THE EVENING) 180 tablet 3  . Pantoprazole Sodium (PROTONIX PO) Take by mouth 2 (two) times daily.    [name] Glycol-Propyl Glycol (LUBRICANT EYE DROPS) 0.4-0.3 % SOLN Place 1-2 drops into both eyes daily.    . pravastatin (PRAVACHOL) 20 MG tablet Take 1 tablet (20 mg total) by mouth daily. (Patient taking differently: Take 20 mg by mouth at bedtime. ) 90 tablet 3  . predniSONE (DELTASONE) 20 MG tablet Take 2 tablets (40 mg total) by mouth daily with breakfast. 10 tablet 0  . ranitidine (ZANTAC) 150 MG tablet Take 1 tablet (150 mg total) by mouth 2 (two) times daily. (Patient taking differently: Take 150 mg by mouth at bedtime. ) 180 tablet 3  . senna-docusate (SENOKOT-S) 8.6-50 MG tablet Take 1 tablet by mouth at bedtime as needed for mild constipation.    . tamsulosin (FLOMAX) 0.4 MG CAPS capsule TAKE 2 CAPSULES BY MOUTH  DAILY 180 capsule 3  . vitamin C (ASCORBIC ACID) 500 MG tablet Take 1 tablet (500 mg total) by mouth daily. (Patient taking differently: Take 500 mg by mouth at bedtime. ) 100 tablet 1   No facility-administered medications prior to visit.     ROS Review of Systems  Constitutional: Positive for fatigue. Negative for appetite change and unexpected weight change.  HENT: Positive for  trouble swallowing. Negative for congestion, nosebleeds, sneezing and sore throat.   Eyes: Negative for itching and visual disturbance.  Respiratory: Negative for cough.   Cardiovascular: Negative for chest pain, palpitations and leg swelling.  Gastrointestinal: Negative for abdominal distention, blood in stool, diarrhea and nausea.  Genitourinary: Negative for frequency and hematuria.  Musculoskeletal: Positive for gait problem. Negative for back pain, joint swelling and neck pain.  Skin: Negative for rash.  Neurological: Positive for weakness. Negative for dizziness, tremors and speech difficulty.  Psychiatric/Behavioral: Negative for agitation, dysphoric mood, sleep disturbance and suicidal ideas. The patient is not nervous/anxious.     Objective:  BP 124/62 (BP Location: Left Arm, Patient Position: Sitting, Cuff Size: Large)   Pulse 75   Temp 97.8 F (36.6 C) (Oral)   Ht 6' (1.829 m)   Wt 222 lb (100.7 kg)   SpO2 98%   BMI 30.11 kg/m   BP Readings from Last 3 Encounters:  12/09/17 124/62  12/05/17 129/67  11/26/17 121/78    Wt Readings from Last 3 Encounters:  12/09/17 222 lb (100.7 kg)  12/05/17 226 lb  3.1 oz (102.6 kg)  11/26/17 238 lb (108 kg)    Physical Exam  Constitutional: He is oriented to person, place, and time. He appears well-developed. No distress.  NAD  HENT:  Mouth/Throat: Oropharynx is clear and moist.  Eyes: Pupils are equal, round, and reactive to light. Conjunctivae are normal.  Neck: Normal range of motion. No JVD present. No thyromegaly present.  Cardiovascular: Normal rate, regular rhythm, normal heart sounds and intact distal pulses. Exam reveals no gallop and no friction rub.  No murmur heard. Pulmonary/Chest: Effort normal and breath sounds normal. No respiratory distress. He has no wheezes. He has no rales. He exhibits no tenderness.  Abdominal: Soft. Bowel sounds are normal. He exhibits no distension and no mass. There is no tenderness.  There is no rebound and no guarding.  Musculoskeletal: Normal range of motion. He exhibits no edema or tenderness.  Lymphadenopathy:    He has no cervical adenopathy.  Neurological: He is alert and oriented to person, place, and time. He has normal reflexes. No cranial nerve deficit. He exhibits normal muscle tone. He displays a negative Romberg sign. Coordination abnormal. Gait normal.  Skin: Skin is warm and dry. No rash noted.  Psychiatric: He has a normal mood and affect. His behavior is normal. Judgment and thought content normal.  flat affect A little ataxic Legs are weak  Lab Results  Component Value Date   WBC 10.2 12/02/2017   HGB 12.3 (L) 12/02/2017   HCT 39.4 12/02/2017   PLT 295 12/02/2017   GLUCOSE 152 (H) 12/02/2017   CHOL 182 12/01/2017   TRIG 174 (H) 12/01/2017   HDL 48 12/01/2017   LDLDIRECT 157.5 07/05/2008   LDLCALC 99 12/01/2017   ALT 24 12/02/2017   AST 19 12/02/2017   NA 138 12/02/2017   K 3.8 12/02/2017   CL 103 12/02/2017   CREATININE 0.71 12/02/2017   BUN 17 12/02/2017   CO2 25 12/02/2017   TSH 0.249 (L) 12/03/2017   PSA 0.49 03/01/2012   INR 0.99 11/30/2017   HGBA1C 4.6 (L) 12/01/2017    Dg Chest 2 View  Result Date: 11/30/2017 CLINICAL DATA:  Tongue and facial swelling for several days. Difficulty speaking. EXAM: CHEST - 2 VIEW COMPARISON:  08/14/2015 FINDINGS: The heart size and mediastinal contours are within normal limits. Chronic elevation of left hemidiaphragm is unchanged. Both lungs are clear. Several old left rib fracture deformities are again seen. Left shoulder prosthesis noted. IMPRESSION: Chronic elevation of left hemidiaphragm. No active cardiopulmonary disease. Electronically Signed   By: Earle Gell M.D.   On: 11/30/2017 18:50   Ct Head Wo Contrast  Result Date: 11/30/2017 CLINICAL DATA:  Allergic reaction after iron infusion. EXAM: CT HEAD WITHOUT CONTRAST TECHNIQUE: Contiguous axial images were obtained from the base of the skull  through the vertex without intravenous contrast. COMPARISON:  MRI of the head December 14, 2014 FINDINGS: BRAIN: No intraparenchymal hemorrhage, mass effect nor midline shift. Patchy to confluent supratentorial white matter hypodensities within normal range for patient's age, though non-specific are most compatible with chronic small vessel ischemic disease. Tiny RIGHT thalamus lacunar infarct versus perivascular space. No acute large vascular territory infarcts. No abnormal extra-axial fluid collections. Basal cisterns are patent. VASCULAR: Mild calcific atherosclerosis of the carotid siphons. SKULL: No skull fracture. No significant scalp soft tissue swelling. LEFT frontal scalp scarring. SINUSES/ORBITS: The mastoid air-cells and included paranasal sinuses are well-aerated.The included ocular globes and orbital contents are non-suspicious. Status post bilateral ocular lens implants. OTHER:  None. IMPRESSION: 1. No acute intracranial process. 2. Moderate to severe chronic small vessel ischemic disease. Electronically Signed   By: Elon Alas M.D.   On: 11/30/2017 17:03   Mr Jeri Cos KG Contrast  Result Date: 12/02/2017 CLINICAL DATA:  Initial evaluation for acute ataxia. EXAM: MRI HEAD WITHOUT AND WITH CONTRAST MRA HEAD WITHOUT CONTRAST TECHNIQUE: Multiplanar, multiecho pulse sequences of the brain and surrounding structures were obtained without and with intravenous contrast. Angiographic images of the head were obtained using MRA technique without contrast. CONTRAST:  55mL MULTIHANCE GADOBENATE DIMEGLUMINE 529 MG/ML IV SOLN COMPARISON:  Prior CT from 11/30/2017. FINDINGS: MRI HEAD FINDINGS Brain: Study mildly degraded by motion artifact. Diffuse prominence of the CSF containing spaces is compatible with generalized age-related cerebral atrophy. Patchy and confluent T2/FLAIR hyperintensity within the periventricular and deep white matter both cerebral hemispheres most consistent with chronic small vessel  ischemic disease, moderate in nature. No abnormal foci of restricted diffusion to suggest acute or subacute ischemia. Gray-white matter differentiation maintained. No encephalomalacia to suggest chronic infarction. No foci of susceptibility artifact to suggest acute or chronic intracranial hemorrhage. No mass lesion, midline shift or mass effect. Ventricles normal size without hydrocephalus. No extra-axial fluid collection. Major dural sinuses grossly patent. No abnormal enhancement. Pituitary gland suprasellar region normal. Midline structures intact and normal. Vascular: Major intracranial vascular flow voids are maintained. Skull and upper cervical spine: Craniocervical junction within normal limits. Upper cervical spine normal. Bone marrow signal intensity mildly decreased on T1 weighted imaging, suspected be related to patient's history of anemia. No discrete osseous lesions. Scalp soft tissues within normal limits. Sinuses/Orbits: Globes normal soft tissues within normal limits. Patient status post cataract extraction bilaterally. Few small retention cysts noted within left maxillary sinus. Paranasal sinuses are otherwise clear. No mastoid effusion. Inner ear structures normal. Other: None. MRA HEAD FINDINGS ANTERIOR CIRCULATION: Distal cervical segments of the internal carotid arteries are patent with antegrade flow. Petrous, cavernous, and supraclinoid segments patent without flow-limiting stenosis. Ophthalmic arteries patent proximally. ICA termini widely patent. A1 segments widely patent. Anterior communicating artery not well delineated. Anterior cerebral arteries widely patent to their distal aspects without stenosis. M1 segments patent without stenosis or occlusion. No proximal M2 occlusion. Distal MCA branches well perfused and symmetric. POSTERIOR CIRCULATION: Dominant right vertebral artery patent to the vertebrobasilar junction without stenosis. Partially visualized left vertebral artery hypoplastic  and largely terminates in PICA. Posterior inferior cerebral arteries themselves are patent proximally. Basilar artery widely patent to its distal aspect. Superior cerebral arteries patent bilaterally. Both of the posterior cerebral artery supplied via the basilar and are well perfused to their distal aspects without stenosis. No intracranial aneurysm. IMPRESSION: MRI HEAD IMPRESSION 1. No acute intracranial abnormality identified. 2. Generalized age-related cerebral atrophy with moderate cerebral white matter disease, nonspecific, but most commonly related to chronic small vessel ischemic changes. MRA HEAD IMPRESSION Negative intracranial MRA. No large vessel occlusion. No high-grade or correctable stenosis identified. Electronically Signed   By: Jeannine Boga M.D.   On: 12/02/2017 00:36   Mr Virgel Paling UR Contrast  Result Date: 12/02/2017 CLINICAL DATA:  Initial evaluation for acute ataxia. EXAM: MRI HEAD WITHOUT AND WITH CONTRAST MRA HEAD WITHOUT CONTRAST TECHNIQUE: Multiplanar, multiecho pulse sequences of the brain and surrounding structures were obtained without and with intravenous contrast. Angiographic images of the head were obtained using MRA technique without contrast. CONTRAST:  16mL MULTIHANCE GADOBENATE DIMEGLUMINE 529 MG/ML IV SOLN COMPARISON:  Prior CT from 11/30/2017. FINDINGS: MRI HEAD FINDINGS Brain:  Study mildly degraded by motion artifact. Diffuse prominence of the CSF containing spaces is compatible with generalized age-related cerebral atrophy. Patchy and confluent T2/FLAIR hyperintensity within the periventricular and deep white matter both cerebral hemispheres most consistent with chronic small vessel ischemic disease, moderate in nature. No abnormal foci of restricted diffusion to suggest acute or subacute ischemia. Gray-white matter differentiation maintained. No encephalomalacia to suggest chronic infarction. No foci of susceptibility artifact to suggest acute or chronic  intracranial hemorrhage. No mass lesion, midline shift or mass effect. Ventricles normal size without hydrocephalus. No extra-axial fluid collection. Major dural sinuses grossly patent. No abnormal enhancement. Pituitary gland suprasellar region normal. Midline structures intact and normal. Vascular: Major intracranial vascular flow voids are maintained. Skull and upper cervical spine: Craniocervical junction within normal limits. Upper cervical spine normal. Bone marrow signal intensity mildly decreased on T1 weighted imaging, suspected be related to patient's history of anemia. No discrete osseous lesions. Scalp soft tissues within normal limits. Sinuses/Orbits: Globes normal soft tissues within normal limits. Patient status post cataract extraction bilaterally. Few small retention cysts noted within left maxillary sinus. Paranasal sinuses are otherwise clear. No mastoid effusion. Inner ear structures normal. Other: None. MRA HEAD FINDINGS ANTERIOR CIRCULATION: Distal cervical segments of the internal carotid arteries are patent with antegrade flow. Petrous, cavernous, and supraclinoid segments patent without flow-limiting stenosis. Ophthalmic arteries patent proximally. ICA termini widely patent. A1 segments widely patent. Anterior communicating artery not well delineated. Anterior cerebral arteries widely patent to their distal aspects without stenosis. M1 segments patent without stenosis or occlusion. No proximal M2 occlusion. Distal MCA branches well perfused and symmetric. POSTERIOR CIRCULATION: Dominant right vertebral artery patent to the vertebrobasilar junction without stenosis. Partially visualized left vertebral artery hypoplastic and largely terminates in PICA. Posterior inferior cerebral arteries themselves are patent proximally. Basilar artery widely patent to its distal aspect. Superior cerebral arteries patent bilaterally. Both of the posterior cerebral artery supplied via the basilar and are well  perfused to their distal aspects without stenosis. No intracranial aneurysm. IMPRESSION: MRI HEAD IMPRESSION 1. No acute intracranial abnormality identified. 2. Generalized age-related cerebral atrophy with moderate cerebral white matter disease, nonspecific, but most commonly related to chronic small vessel ischemic changes. MRA HEAD IMPRESSION Negative intracranial MRA. No large vessel occlusion. No high-grade or correctable stenosis identified. Electronically Signed   By: Jeannine Boga M.D.   On: 12/02/2017 00:36    Assessment & Plan:      Follow-up: No follow-ups on file.  [name], MD

## 2017-12-09 NOTE — Patient Instructions (Addendum)
Stop pravastatin Take CoQ10 as directed Take Vitamin B complex

## 2017-12-15 LAB — ACETYLCHOLINE RECEPTOR AB, ALL
Acety choline binding ab: 6.77 nmol/L — ABNORMAL HIGH (ref 0.00–0.24)
Acetylchol Block Ab: 70 % — ABNORMAL HIGH (ref 0–25)
Acetylcholine Modulat Ab: 57 % — ABNORMAL HIGH (ref 0–20)

## 2017-12-16 DIAGNOSIS — Z471 Aftercare following joint replacement surgery: Secondary | ICD-10-CM | POA: Diagnosis not present

## 2017-12-16 DIAGNOSIS — Z96652 Presence of left artificial knee joint: Secondary | ICD-10-CM | POA: Diagnosis not present

## 2017-12-17 LAB — ACETYLCHOLINE RECEPTOR AB, ALL
Acety choline binding ab: 13.6 nmol/L — ABNORMAL HIGH (ref 0.00–0.24)
Acetylchol Block Ab: 69 % — ABNORMAL HIGH (ref 0–25)
Acetylcholine Modulat Ab: 56 % — ABNORMAL HIGH (ref 0–20)

## 2017-12-21 ENCOUNTER — Other Ambulatory Visit: Payer: Self-pay | Admitting: Internal Medicine

## 2017-12-21 DIAGNOSIS — R29898 Other symptoms and signs involving the musculoskeletal system: Secondary | ICD-10-CM

## 2017-12-21 NOTE — Progress Notes (Signed)
Spoke w/Sewell and [name] Informed of test results Neurol ref

## 2017-12-22 ENCOUNTER — Encounter: Payer: Self-pay | Admitting: Internal Medicine

## 2017-12-23 NOTE — Assessment & Plan Note (Signed)
Myasthenia gravis labs are pending

## 2017-12-23 NOTE — Assessment & Plan Note (Addendum)
Myasthenia gravis labs are pending Neurol ref PT

## 2017-12-23 NOTE — Assessment & Plan Note (Signed)
On B12 

## 2018-01-04 ENCOUNTER — Other Ambulatory Visit: Payer: Self-pay | Admitting: Hematology and Oncology

## 2018-01-04 ENCOUNTER — Inpatient Hospital Stay: Payer: Medicare Other | Attending: Hematology and Oncology

## 2018-01-04 DIAGNOSIS — Z79899 Other long term (current) drug therapy: Secondary | ICD-10-CM | POA: Insufficient documentation

## 2018-01-04 DIAGNOSIS — R531 Weakness: Secondary | ICD-10-CM | POA: Diagnosis not present

## 2018-01-04 DIAGNOSIS — G7000 Myasthenia gravis without (acute) exacerbation: Secondary | ICD-10-CM | POA: Diagnosis not present

## 2018-01-04 DIAGNOSIS — D509 Iron deficiency anemia, unspecified: Secondary | ICD-10-CM | POA: Insufficient documentation

## 2018-01-04 DIAGNOSIS — D500 Iron deficiency anemia secondary to blood loss (chronic): Secondary | ICD-10-CM

## 2018-01-04 LAB — CBC WITH DIFFERENTIAL/PLATELET
Basophils Absolute: 0 10*3/uL (ref 0.0–0.1)
Basophils Relative: 1 %
Eosinophils Absolute: 0.2 10*3/uL (ref 0.0–0.5)
Eosinophils Relative: 4 %
HCT: 36.9 % — ABNORMAL LOW (ref 38.4–49.9)
Hemoglobin: 11.4 g/dL — ABNORMAL LOW (ref 13.0–17.1)
Lymphocytes Relative: 29 %
Lymphs Abs: 1.1 10*3/uL (ref 0.9–3.3)
MCH: 28 pg (ref 27.2–33.4)
MCHC: 30.9 g/dL — ABNORMAL LOW (ref 32.0–36.0)
MCV: 90.7 fL (ref 79.3–98.0)
Monocytes Absolute: 0.8 10*3/uL (ref 0.1–0.9)
Monocytes Relative: 21 %
Neutro Abs: 1.8 10*3/uL (ref 1.5–6.5)
Neutrophils Relative %: 45 %
Platelets: 242 10*3/uL (ref 140–400)
RBC: 4.07 MIL/uL — ABNORMAL LOW (ref 4.20–5.82)
RDW: 15.4 % — ABNORMAL HIGH (ref 11.0–14.6)
WBC: 4 10*3/uL (ref 4.0–10.3)

## 2018-01-04 LAB — FERRITIN: Ferritin: 28 ng/mL (ref 22–316)

## 2018-01-07 ENCOUNTER — Ambulatory Visit (INDEPENDENT_AMBULATORY_CARE_PROVIDER_SITE_OTHER): Payer: Medicare Other | Admitting: Internal Medicine

## 2018-01-07 ENCOUNTER — Encounter: Payer: Self-pay | Admitting: Internal Medicine

## 2018-01-07 DIAGNOSIS — R531 Weakness: Secondary | ICD-10-CM | POA: Diagnosis not present

## 2018-01-07 DIAGNOSIS — R29898 Other symptoms and signs involving the musculoskeletal system: Secondary | ICD-10-CM

## 2018-01-07 DIAGNOSIS — E538 Deficiency of other specified B group vitamins: Secondary | ICD-10-CM | POA: Diagnosis not present

## 2018-01-07 DIAGNOSIS — T783XXA Angioneurotic edema, initial encounter: Secondary | ICD-10-CM | POA: Diagnosis not present

## 2018-01-07 NOTE — Assessment & Plan Note (Signed)
Weakness is not better. R/o MG. Speech is better. Swallowing - better  Form filled out Neurol appt is pending

## 2018-01-07 NOTE — Progress Notes (Signed)
Subjective:  Patient ID: [name], male    DOB: Jan 01, 1941  Age: 77 y.o. MRN: [id number]  CC: No chief complaint on file.   HPI [name] presents for weakness, gait disorder - weak legs, ?MG Weakness is not better. Speech is better. Swallowing - better  Form filled out  Outpatient Medications Prior to Visit  Medication Sig Dispense Refill  . aspirin EC 81 MG tablet Take 1 tablet (81 mg total) by mouth daily. 100 tablet 3  . Cholecalciferol (VITAMIN D3) 2000 units TABS Take 2,000 Units by mouth at bedtime.    . Coenzyme Q10 (CO Q-10 PO) Take 1 tablet by mouth at bedtime.    . Cyanocobalamin (VITAMIN B-12) 1000 MCG SUBL Place 1 tablet (1,000 mcg total) under the tongue daily. (Patient taking differently: Place 1,000 mcg under the tongue at bedtime. ) 100 tablet 3  . DULoxetine (CYMBALTA) 60 MG capsule TAKE 1 CAPSULE BY MOUTH  DAILY 90 capsule 1  . finasteride (PROSCAR) 5 MG tablet TAKE 1 TABLET BY MOUTH  DAILY (Patient taking differently: TAKE 1 TABLET (5 MG) BY MOUTH  DAILY AT NIGHT) 90 tablet 3  . folic acid (V-R FOLIC ACID) 465 MCG tablet Take 1 tablet (400 mcg total) by mouth daily. (Patient taking differently: Take 400 mcg by mouth at bedtime. ) 90 tablet 3  . furosemide (LASIX) 20 MG tablet TAKE 1 TO 2 TABLETS BY  MOUTH DAILY AS NEEDED FOR  EDEMA (Patient taking differently: TAKE 1 TABLET (20 MG) BY  MOUTH DAILY IN THE EVENING) 180 tablet 3  . LORazepam (ATIVAN) 1 MG tablet Take 1 tablet (1 mg total) by mouth 2 (two) times daily as needed for anxiety or sleep. 60 tablet 1  . Pantoprazole Sodium (PROTONIX PO) Take by mouth 2 (two) times daily.    Vladimir Faster Glycol-Propyl Glycol (LUBRICANT EYE DROPS) 0.4-0.3 % SOLN Place 1-2 drops into both eyes daily.    . pravastatin (PRAVACHOL) 20 MG tablet Take 1 tablet (20 mg total) by mouth daily. (Patient taking differently: Take 20 mg by mouth at bedtime. ) 90 tablet 3  . ranitidine (ZANTAC) 150 MG tablet Take 1 tablet (150  mg total) by mouth 2 (two) times daily. (Patient taking differently: Take 150 mg by mouth at bedtime. ) 180 tablet 3  . senna-docusate (SENOKOT-S) 8.6-50 MG tablet Take 1 tablet by mouth at bedtime as needed for mild constipation.    . tamsulosin (FLOMAX) 0.4 MG CAPS capsule TAKE 2 CAPSULES BY MOUTH  DAILY 180 capsule 3  . vitamin C (ASCORBIC ACID) 500 MG tablet Take 1 tablet (500 mg total) by mouth daily. (Patient taking differently: Take 500 mg by mouth at bedtime. ) 100 tablet 1  . predniSONE (DELTASONE) 20 MG tablet Take 2 tablets (40 mg total) by mouth daily with breakfast. 10 tablet 0   No facility-administered medications prior to visit.     ROS Review of Systems  Constitutional: Positive for fatigue. Negative for appetite change and unexpected weight change.  HENT: Negative for congestion, nosebleeds, sneezing, sore throat and trouble swallowing.   Eyes: Negative for itching and visual disturbance.  Respiratory: Negative for cough, shortness of breath and wheezing.   Cardiovascular: Negative for chest pain, palpitations and leg swelling.  Gastrointestinal: Negative for abdominal distention, blood in stool, diarrhea and nausea.  Genitourinary: Negative for frequency and hematuria.  Musculoskeletal: Positive for arthralgias and gait problem. Negative for back pain, joint swelling and neck pain.  Skin:  Negative for rash.  Neurological: Positive for weakness. Negative for dizziness, tremors and speech difficulty.  Psychiatric/Behavioral: Negative for agitation, dysphoric mood, sleep disturbance and suicidal ideas. The patient is not nervous/anxious.     Objective:  BP 124/68 (BP Location: Left Arm, Patient Position: Sitting, Cuff Size: Large)   Pulse 70   Temp 97.8 F (36.6 C) (Oral)   Ht 6' (1.829 m)   Wt 234 lb (106.1 kg)   SpO2 98%   BMI 31.74 kg/m   BP Readings from Last 3 Encounters:  01/07/18 124/68  12/09/17 124/62  12/05/17 129/67    Wt Readings from Last 3  Encounters:  01/07/18 234 lb (106.1 kg)  12/09/17 222 lb (100.7 kg)  12/05/17 226 lb 3.1 oz (102.6 kg)    Physical Exam  Constitutional: He is oriented to person, place, and time. He appears well-developed. No distress.  NAD  HENT:  Mouth/Throat: Oropharynx is clear and moist.  Eyes: Pupils are equal, round, and reactive to light. Conjunctivae are normal.  Neck: Normal range of motion. No JVD present. No thyromegaly present.  Cardiovascular: Normal rate, regular rhythm, normal heart sounds and intact distal pulses. Exam reveals no gallop and no friction rub.  No murmur heard. Pulmonary/Chest: Effort normal and breath sounds normal. No respiratory distress. He has no wheezes. He has no rales. He exhibits no tenderness.  Abdominal: Soft. Bowel sounds are normal. He exhibits no distension and no mass. There is no tenderness. There is no rebound and no guarding.  Musculoskeletal: Normal range of motion. He exhibits no edema or tenderness.  Lymphadenopathy:    He has no cervical adenopathy.  Neurological: He is alert and oriented to person, place, and time. He has normal reflexes. No cranial nerve deficit. He exhibits normal muscle tone. He displays a negative Romberg sign. Coordination abnormal. Gait normal.  Skin: Skin is warm and dry. No rash noted.  Psychiatric: His behavior is normal. Judgment and thought content normal.   Ataxic Weak legs Quiet  Lab Results  Component Value Date   WBC 4.0 01/04/2018   HGB 11.4 (L) 01/04/2018   HCT 36.9 (L) 01/04/2018   PLT 242 01/04/2018   GLUCOSE 152 (H) 12/02/2017   CHOL 182 12/01/2017   TRIG 174 (H) 12/01/2017   HDL 48 12/01/2017   LDLDIRECT 157.5 07/05/2008   LDLCALC 99 12/01/2017   ALT 24 12/02/2017   AST 19 12/02/2017   NA 138 12/02/2017   K 3.8 12/02/2017   CL 103 12/02/2017   CREATININE 0.71 12/02/2017   BUN 17 12/02/2017   CO2 25 12/02/2017   TSH 0.249 (L) 12/03/2017   PSA 0.49 03/01/2012   INR 0.99 11/30/2017   HGBA1C 4.6  (L) 12/01/2017    Dg Chest 2 View  Result Date: 11/30/2017 CLINICAL DATA:  Tongue and facial swelling for several days. Difficulty speaking. EXAM: CHEST - 2 VIEW COMPARISON:  08/14/2015 FINDINGS: The heart size and mediastinal contours are within normal limits. Chronic elevation of left hemidiaphragm is unchanged. Both lungs are clear. Several old left rib fracture deformities are again seen. Left shoulder prosthesis noted. IMPRESSION: Chronic elevation of left hemidiaphragm. No active cardiopulmonary disease. Electronically Signed   By: Earle Gell M.D.   On: 11/30/2017 18:50   Ct Head Wo Contrast  Result Date: 11/30/2017 CLINICAL DATA:  Allergic reaction after iron infusion. EXAM: CT HEAD WITHOUT CONTRAST TECHNIQUE: Contiguous axial images were obtained from the base of the skull through the vertex without intravenous contrast. COMPARISON:  MRI of the head December 14, 2014 FINDINGS: BRAIN: No intraparenchymal hemorrhage, mass effect nor midline shift. Patchy to confluent supratentorial white matter hypodensities within normal range for patient's age, though non-specific are most compatible with chronic small vessel ischemic disease. Tiny RIGHT thalamus lacunar infarct versus perivascular space. No acute large vascular territory infarcts. No abnormal extra-axial fluid collections. Basal cisterns are patent. VASCULAR: Mild calcific atherosclerosis of the carotid siphons. SKULL: No skull fracture. No significant scalp soft tissue swelling. LEFT frontal scalp scarring. SINUSES/ORBITS: The mastoid air-cells and included paranasal sinuses are well-aerated.The included ocular globes and orbital contents are non-suspicious. Status post bilateral ocular lens implants. OTHER: None. IMPRESSION: 1. No acute intracranial process. 2. Moderate to severe chronic small vessel ischemic disease. Electronically Signed   By: Elon Alas M.D.   On: 11/30/2017 17:03   Mr Jeri Cos ST Contrast  Result Date:  12/02/2017 CLINICAL DATA:  Initial evaluation for acute ataxia. EXAM: MRI HEAD WITHOUT AND WITH CONTRAST MRA HEAD WITHOUT CONTRAST TECHNIQUE: Multiplanar, multiecho pulse sequences of the brain and surrounding structures were obtained without and with intravenous contrast. Angiographic images of the head were obtained using MRA technique without contrast. CONTRAST:  57mL MULTIHANCE GADOBENATE DIMEGLUMINE 529 MG/ML IV SOLN COMPARISON:  Prior CT from 11/30/2017. FINDINGS: MRI HEAD FINDINGS Brain: Study mildly degraded by motion artifact. Diffuse prominence of the CSF containing spaces is compatible with generalized age-related cerebral atrophy. Patchy and confluent T2/FLAIR hyperintensity within the periventricular and deep white matter both cerebral hemispheres most consistent with chronic small vessel ischemic disease, moderate in nature. No abnormal foci of restricted diffusion to suggest acute or subacute ischemia. Gray-white matter differentiation maintained. No encephalomalacia to suggest chronic infarction. No foci of susceptibility artifact to suggest acute or chronic intracranial hemorrhage. No mass lesion, midline shift or mass effect. Ventricles normal size without hydrocephalus. No extra-axial fluid collection. Major dural sinuses grossly patent. No abnormal enhancement. Pituitary gland suprasellar region normal. Midline structures intact and normal. Vascular: Major intracranial vascular flow voids are maintained. Skull and upper cervical spine: Craniocervical junction within normal limits. Upper cervical spine normal. Bone marrow signal intensity mildly decreased on T1 weighted imaging, suspected be related to patient's history of anemia. No discrete osseous lesions. Scalp soft tissues within normal limits. Sinuses/Orbits: Globes normal soft tissues within normal limits. Patient status post cataract extraction bilaterally. Few small retention cysts noted within left maxillary sinus. Paranasal sinuses are  otherwise clear. No mastoid effusion. Inner ear structures normal. Other: None. MRA HEAD FINDINGS ANTERIOR CIRCULATION: Distal cervical segments of the internal carotid arteries are patent with antegrade flow. Petrous, cavernous, and supraclinoid segments patent without flow-limiting stenosis. Ophthalmic arteries patent proximally. ICA termini widely patent. A1 segments widely patent. Anterior communicating artery not well delineated. Anterior cerebral arteries widely patent to their distal aspects without stenosis. M1 segments patent without stenosis or occlusion. No proximal M2 occlusion. Distal MCA branches well perfused and symmetric. POSTERIOR CIRCULATION: Dominant right vertebral artery patent to the vertebrobasilar junction without stenosis. Partially visualized left vertebral artery hypoplastic and largely terminates in PICA. Posterior inferior cerebral arteries themselves are patent proximally. Basilar artery widely patent to its distal aspect. Superior cerebral arteries patent bilaterally. Both of the posterior cerebral artery supplied via the basilar and are well perfused to their distal aspects without stenosis. No intracranial aneurysm. IMPRESSION: MRI HEAD IMPRESSION 1. No acute intracranial abnormality identified. 2. Generalized age-related cerebral atrophy with moderate cerebral white matter disease, nonspecific, but most commonly related to chronic small vessel ischemic changes.  MRA HEAD IMPRESSION Negative intracranial MRA. No large vessel occlusion. No high-grade or correctable stenosis identified. Electronically Signed   By: Jeannine Boga M.D.   On: 12/02/2017 00:36   Mr Virgel Paling [facility] Contrast  Result Date: 12/02/2017 CLINICAL DATA:  Initial evaluation for acute ataxia. EXAM: MRI HEAD WITHOUT AND WITH CONTRAST MRA HEAD WITHOUT CONTRAST TECHNIQUE: Multiplanar, multiecho pulse sequences of the brain and surrounding structures were obtained without and with intravenous contrast.  Angiographic images of the head were obtained using MRA technique without contrast. CONTRAST:  44mL MULTIHANCE GADOBENATE DIMEGLUMINE 529 MG/ML IV SOLN COMPARISON:  Prior CT from 11/30/2017. FINDINGS: MRI HEAD FINDINGS Brain: Study mildly degraded by motion artifact. Diffuse prominence of the CSF containing spaces is compatible with generalized age-related cerebral atrophy. Patchy and confluent T2/FLAIR hyperintensity within the periventricular and deep white matter both cerebral hemispheres most consistent with chronic small vessel ischemic disease, moderate in nature. No abnormal foci of restricted diffusion to suggest acute or subacute ischemia. Gray-white matter differentiation maintained. No encephalomalacia to suggest chronic infarction. No foci of susceptibility artifact to suggest acute or chronic intracranial hemorrhage. No mass lesion, midline shift or mass effect. Ventricles normal size without hydrocephalus. No extra-axial fluid collection. Major dural sinuses grossly patent. No abnormal enhancement. Pituitary gland suprasellar region normal. Midline structures intact and normal. Vascular: Major intracranial vascular flow voids are maintained. Skull and upper cervical spine: Craniocervical junction within normal limits. Upper cervical spine normal. Bone marrow signal intensity mildly decreased on T1 weighted imaging, suspected be related to patient's history of anemia. No discrete osseous lesions. Scalp soft tissues within normal limits. Sinuses/Orbits: Globes normal soft tissues within normal limits. Patient status post cataract extraction bilaterally. Few small retention cysts noted within left maxillary sinus. Paranasal sinuses are otherwise clear. No mastoid effusion. Inner ear structures normal. Other: None. MRA HEAD FINDINGS ANTERIOR CIRCULATION: Distal cervical segments of the internal carotid arteries are patent with antegrade flow. Petrous, cavernous, and supraclinoid segments patent without  flow-limiting stenosis. Ophthalmic arteries patent proximally. ICA termini widely patent. A1 segments widely patent. Anterior communicating artery not well delineated. Anterior cerebral arteries widely patent to their distal aspects without stenosis. M1 segments patent without stenosis or occlusion. No proximal M2 occlusion. Distal MCA branches well perfused and symmetric. POSTERIOR CIRCULATION: Dominant right vertebral artery patent to the vertebrobasilar junction without stenosis. Partially visualized left vertebral artery hypoplastic and largely terminates in PICA. Posterior inferior cerebral arteries themselves are patent proximally. Basilar artery widely patent to its distal aspect. Superior cerebral arteries patent bilaterally. Both of the posterior cerebral artery supplied via the basilar and are well perfused to their distal aspects without stenosis. No intracranial aneurysm. IMPRESSION: MRI HEAD IMPRESSION 1. No acute intracranial abnormality identified. 2. Generalized age-related cerebral atrophy with moderate cerebral white matter disease, nonspecific, but most commonly related to chronic small vessel ischemic changes. MRA HEAD IMPRESSION Negative intracranial MRA. No large vessel occlusion. No high-grade or correctable stenosis identified. Electronically Signed   By: Jeannine Boga M.D.   On: 12/02/2017 00:36    Assessment & Plan:   There are no diagnoses linked to this encounter. I have discontinued Kire Ferg. Elizardo's predniSONE. I am also having him maintain his Vitamin Q-46, folic acid, vitamin C, ranitidine, pravastatin, furosemide, finasteride, Coenzyme Q10 (CO Q-10 PO), Vitamin D3, Polyethyl Glycol-Propyl Glycol, DULoxetine, tamsulosin, aspirin EC, senna-docusate, Pantoprazole Sodium (PROTONIX PO), and LORazepam.  No orders of the defined types were placed in this encounter.    Follow-up: No follow-ups on file.  [name], MD

## 2018-01-07 NOTE — Assessment & Plan Note (Signed)
On B12 

## 2018-01-07 NOTE — Assessment & Plan Note (Signed)
Resolved

## 2018-01-08 ENCOUNTER — Inpatient Hospital Stay (HOSPITAL_BASED_OUTPATIENT_CLINIC_OR_DEPARTMENT_OTHER): Payer: Medicare Other | Admitting: Hematology and Oncology

## 2018-01-08 ENCOUNTER — Inpatient Hospital Stay: Payer: Medicare Other

## 2018-01-08 ENCOUNTER — Encounter: Payer: Self-pay | Admitting: Hematology and Oncology

## 2018-01-08 DIAGNOSIS — R531 Weakness: Secondary | ICD-10-CM

## 2018-01-08 DIAGNOSIS — Z79899 Other long term (current) drug therapy: Secondary | ICD-10-CM | POA: Diagnosis not present

## 2018-01-08 DIAGNOSIS — G7000 Myasthenia gravis without (acute) exacerbation: Secondary | ICD-10-CM | POA: Diagnosis not present

## 2018-01-08 DIAGNOSIS — D509 Iron deficiency anemia, unspecified: Secondary | ICD-10-CM

## 2018-01-08 DIAGNOSIS — D500 Iron deficiency anemia secondary to blood loss (chronic): Secondary | ICD-10-CM

## 2018-01-08 NOTE — Assessment & Plan Note (Signed)
The patient had known history of recurrent iron deficiency anemia, thought to be related to GI bleed He denies NSAID but he remain on antiplatelet agents He tolerated oral iron supplement very poorly. He has responded to intermittent intravenous iron infusion He is mildly anemic again and ferritin level is low However, the patient and family members are concerned about recent neurological event that was linked to recent intravenous iron I told her there is no data to support intravenous iron could cause myasthenia gravis In any case, I recommend holding off intravenous iron infusion today and to proceed with neurological work-up In the meantime, I recommend iron rich diet The patient's family will call me again in the future if he needs iron infusion In the meantime, he will continue close blood count monitoring and follow-up with his primary care physician

## 2018-01-08 NOTE — Progress Notes (Signed)
[facility] OFFICE PROGRESS NOTE  [name], Lee Lacks, MD  ASSESSMENT & PLAN:  Anemia, iron deficiency The patient had known history of recurrent iron deficiency anemia, thought to be related to GI bleed He denies NSAID but he remain on antiplatelet agents He tolerated oral iron supplement very poorly. He has responded to intermittent intravenous iron infusion He is mildly anemic again and ferritin level is low However, the patient and family members are concerned about recent neurological event that was linked to recent intravenous iron I told her there is no data to support intravenous iron could cause myasthenia gravis In any case, I recommend holding off intravenous iron infusion today and to proceed with neurological work-up In the meantime, I recommend iron rich diet The patient's family will call me again in the future if he needs iron infusion In the meantime, he will continue close blood count monitoring and follow-up with his primary care physician  Myast[facility]) I have reviewed his recent work-up extensively The patient appears to have neurological weakness that was attributed to possible myasthenia gravis I printed a list of medication that could cause worsening myasthenia and went through his current list He is no longer on a statin drug He has appointment to see neurologist next week I would defer to them for further management and work-up Again, I do not believe recent intravenous iron infusion is the culprit that cause new onset myasthenia gravis   No orders of the defined types were placed in this encounter.   INTERVAL HISTORY: [name] 77 y.o. male returns for further follow-up regarding iron deficiency anemia He received intravenous iron infusion at the patient care unit at the end of March Since the iron infusion, he gotten progressively weak with dysarthria and possibly confusion He was subsequently admitted to the hospital with  extensive work-up which rule out stroke Subsequently, his blood test came back positive for possible diagnosis of myasthenia He has appointment pending to see neurologist In the meantime, he continues to feel weak but denies shortness of breath The patient denies any recent signs or symptoms of bleeding such as spontaneous epistaxis, hematuria or hematochezia.  SUMMARY OF HEMATOLOGIC HISTORY:  His baseline blood work in July of 2013 were within normal limits. He was found to have abnormal CBC from blood drawn on 01/24/2014 which showed hemoglobin of 7 with MCV of 77.3. He was also found to have low serum vitamin B12 and he was given vitamin B12 injection. The patient subsequently had progressive fatigue and anemia. He was hospitalized and was given blood transfusions. Upper endoscopy evaluation was normal. A subsequent colonoscopy was also normal. He received iron infusion and is currently taking oral iron supplement. The patient denies over the counter NSAID ingestion. He is on antiplatelets agents. He was given intramuscular vitamin B-12 injection and iron infusion in October 2015. The patient have recurrence of iron deficiency anemia and started to receive IV iron again in 2018 and 2019  I have reviewed the past medical history, past surgical history, social history and family history with the patient and they are unchanged from previous note.  ALLERGIES:  has No Known Allergies.  MEDICATIONS:  Current Outpatient Medications  Medication Sig Dispense Refill  . aspirin EC 81 MG tablet Take 1 tablet (81 mg total) by mouth daily. 100 tablet 3  . Cholecalciferol (VITAMIN D3) 2000 units TABS Take 2,000 Units by mouth at bedtime.    . Coenzyme Q10 (CO Q-10 PO) Take 1 tablet by  mouth at bedtime.    . Cyanocobalamin (VITAMIN B-12) 1000 MCG SUBL Place 1 tablet (1,000 mcg total) under the tongue daily. (Patient taking differently: Place 1,000 mcg under the tongue at bedtime. ) 100 tablet 3  .  DULoxetine (CYMBALTA) 60 MG capsule TAKE 1 CAPSULE BY MOUTH  DAILY 90 capsule 1  . finasteride (PROSCAR) 5 MG tablet TAKE 1 TABLET BY MOUTH  DAILY (Patient taking differently: TAKE 1 TABLET (5 MG) BY MOUTH  DAILY AT NIGHT) 90 tablet 3  . folic acid (V-R FOLIC ACID) 294 MCG tablet Take 1 tablet (400 mcg total) by mouth daily. (Patient taking differently: Take 400 mcg by mouth at bedtime. ) 90 tablet 3  . furosemide (LASIX) 20 MG tablet TAKE 1 TO 2 TABLETS BY  MOUTH DAILY AS NEEDED FOR  EDEMA (Patient taking differently: TAKE 1 TABLET (20 MG) BY  MOUTH DAILY IN THE EVENING) 180 tablet 3  . LORazepam (ATIVAN) 1 MG tablet Take 1 tablet (1 mg total) by mouth 2 (two) times daily as needed for anxiety or sleep. 60 tablet 1  . Polyethyl Glycol-Propyl Glycol (LUBRICANT EYE DROPS) 0.4-0.3 % SOLN Place 1-2 drops into both eyes daily.    . ranitidine (ZANTAC) 150 MG tablet Take 1 tablet (150 mg total) by mouth 2 (two) times daily. (Patient taking differently: Take 150 mg by mouth at bedtime. ) 180 tablet 3  . senna-docusate (SENOKOT-S) 8.6-50 MG tablet Take 1 tablet by mouth at bedtime as needed for mild constipation.    . tamsulosin (FLOMAX) 0.4 MG CAPS capsule TAKE 2 CAPSULES BY MOUTH  DAILY 180 capsule 3  . vitamin C (ASCORBIC ACID) 500 MG tablet Take 1 tablet (500 mg total) by mouth daily. (Patient taking differently: Take 500 mg by mouth at bedtime. ) 100 tablet 1   No current facility-administered medications for this visit.      REVIEW OF SYSTEMS:   Constitutional: Denies fevers, chills or night sweats Eyes: Denies blurriness of vision Ears, nose, mouth, throat, and face: Denies mucositis or sore throat Respiratory: Denies cough, dyspnea or wheezes Cardiovascular: Denies palpitation, chest discomfort or lower extremity swelling Gastrointestinal:  Denies nausea, heartburn or change in bowel habits Skin: Denies abnormal skin rashes Lymphatics: Denies new lymphadenopathy or easy  bruising Neurological:Denies numbness, tingling or new weaknesses Behavioral/Psych: Mood is stable, no new changes  All other systems were reviewed with the patient and are negative.  PHYSICAL EXAMINATION: ECOG PERFORMANCE STATUS: 2 - Symptomatic, <50% confined to bed  Vitals:   01/08/18 0938  BP: 106/66  Pulse: 72  Resp: 18  Temp: 98.2 F (36.8 C)  SpO2: 96%   Filed Weights   01/08/18 0938  Weight: 235 lb 3.2 oz (106.7 kg)    GENERAL:alert, no distress and comfortable NEURO: alert & oriented x 3 with fluent speech, no focal motor/sensory deficits  LABORATORY DATA:  I have reviewed the data as listed     Component Value Date/Time   NA 138 12/02/2017 0704   NA 140 05/16/2014 1327   K 3.8 12/02/2017 0704   K 3.8 05/16/2014 1327   CL 103 12/02/2017 0704   CO2 25 12/02/2017 0704   CO2 28 05/16/2014 1327   GLUCOSE 152 (H) 12/02/2017 0704   GLUCOSE 98 05/16/2014 1327   BUN 17 12/02/2017 0704   BUN 17.6 05/16/2014 1327   CREATININE 0.71 12/02/2017 0704   CREATININE 0.82 08/13/2015 0953   CREATININE 0.9 05/16/2014 1327   CALCIUM 9.7 12/02/2017 0704  CALCIUM 9.4 05/16/2014 1327   PROT 6.4 (L) 12/02/2017 0704   PROT 6.9 05/16/2014 1327   ALBUMIN 3.6 12/02/2017 0704   ALBUMIN 3.7 05/16/2014 1327   AST 19 12/02/2017 0704   AST 19 05/16/2014 1327   ALT 24 12/02/2017 0704   ALT 9 05/16/2014 1327   ALKPHOS 67 12/02/2017 0704   ALKPHOS 66 05/16/2014 1327   BILITOT 0.5 12/02/2017 0704   BILITOT 0.50 05/16/2014 1327   GFRNONAA >60 12/02/2017 0704   GFRNONAA 87 08/13/2015 0953   GFRAA >60 12/02/2017 0704   GFRAA >89 08/13/2015 0953    No results found for: SPEP, UPEP  Lab Results  Component Value Date   WBC 4.0 01/04/2018   NEUTROABS 1.8 01/04/2018   HGB 11.4 (L) 01/04/2018   HCT 36.9 (L) 01/04/2018   MCV 90.7 01/04/2018   PLT 242 01/04/2018      Chemistry      Component Value Date/Time   NA 138 12/02/2017 0704   NA 140 05/16/2014 1327   K 3.8 12/02/2017  0704   K 3.8 05/16/2014 1327   CL 103 12/02/2017 0704   CO2 25 12/02/2017 0704   CO2 28 05/16/2014 1327   BUN 17 12/02/2017 0704   BUN 17.6 05/16/2014 1327   CREATININE 0.71 12/02/2017 0704   CREATININE 0.82 08/13/2015 0953   CREATININE 0.9 05/16/2014 1327      Component Value Date/Time   CALCIUM 9.7 12/02/2017 0704   CALCIUM 9.4 05/16/2014 1327   ALKPHOS 67 12/02/2017 0704   ALKPHOS 66 05/16/2014 1327   AST 19 12/02/2017 0704   AST 19 05/16/2014 1327   ALT 24 12/02/2017 0704   ALT 9 05/16/2014 1327   BILITOT 0.5 12/02/2017 0704   BILITOT 0.50 05/16/2014 1327      I spent 10 minutes counseling the patient face to face. The total time spent in the appointment was 15 minutes and more than 50% was on counseling.   All questions were answered. The patient knows to call the clinic with any problems, questions or concerns. No barriers to learning was detected.    Heath Lark, MD 5/17/201910:55 AM

## 2018-01-08 NOTE — Assessment & Plan Note (Signed)
I have reviewed his recent work-up extensively The patient appears to have neurological weakness that was attributed to possible myasthenia gravis I printed a list of medication that could cause worsening myasthenia and went through his current list He is no longer on a statin drug He has appointment to see neurologist next week I would defer to them for further management and work-up Again, I do not believe recent intravenous iron infusion is the culprit that cause new onset myasthenia gravis

## 2018-01-13 ENCOUNTER — Other Ambulatory Visit: Payer: Self-pay | Admitting: Neurology

## 2018-01-13 ENCOUNTER — Ambulatory Visit: Payer: Medicare Other | Admitting: Neurology

## 2018-01-13 ENCOUNTER — Telehealth: Payer: Self-pay | Admitting: Neurology

## 2018-01-13 ENCOUNTER — Encounter: Payer: Self-pay | Admitting: Neurology

## 2018-01-13 VITALS — BP 112/70 | HR 63 | Ht 72.0 in | Wt 236.0 lb

## 2018-01-13 DIAGNOSIS — G7000 Myasthenia gravis without (acute) exacerbation: Secondary | ICD-10-CM | POA: Diagnosis not present

## 2018-01-13 DIAGNOSIS — R29898 Other symptoms and signs involving the musculoskeletal system: Secondary | ICD-10-CM | POA: Insufficient documentation

## 2018-01-13 MED ORDER — PYRIDOSTIGMINE BROMIDE 60 MG PO TABS: 60.0000 mg | ORAL_TABLET | Freq: Three times a day (TID) | ORAL | 0 refills | Status: DC

## 2018-01-13 MED ORDER — PYRIDOSTIGMINE BROMIDE 60 MG PO TABS: 60.0000 mg | ORAL_TABLET | Freq: Three times a day (TID) | ORAL | 3 refills | Status: DC

## 2018-01-13 NOTE — Telephone Encounter (Signed)
[employer] order sent to [facility]. No auth they will reach out to the pt to schedule.  °

## 2018-01-13 NOTE — Addendum Note (Signed)
Addended by: Larey Seat on: 01/13/2018 02:57 PM   Modules accepted: Orders

## 2018-01-13 NOTE — Patient Instructions (Signed)
Myasthenia Gravis  Myasthenia gravis (MG) means severe weakness. It is a long-term (chronic) condition that causes weakness in the muscles you can control (voluntary muscles). MG can affect any voluntary muscle. The muscles most often affected are the ones that control:   Eye movement.   Facial movements.   Swallowing.    MG is an autoimmune disease, which means that your body's defense system (immune system) attacks healthy parts of your body instead of germs and other things that make you sick. When you have MG, your immune system makes proteins (antibodies) that block the chemical (acetylcholine) your body needs to send nerve signals to your muscles. This causes muscle weakness.  What are the causes?  The exact cause of MG is unknown. One possible cause is an enlarged thymus gland, which is located under your breastbone.  What are the signs or symptoms?  The earliest symptom of MG is muscle weakness that gets worse with activity and gets better after rest. Other symptoms of MG may include:   Drooping eyelids.   Double vision.   Loss of facial expression.   Trouble chewing and swallowing.   Slurred speech.   A waddling walk.   Weakness of the arms, hands, and legs.    Trouble breathing is the most dangerous symptom of MG. Sudden and severe difficulty breathing (myasthenic crisis) may require emergency breathing support. This symptom sometimes happens after:   Infection.   Fever.   Drug reaction.    How is this diagnosed?  It can be hard to diagnose MG because muscle weakness is a common symptom in many conditions. Your health care provider will do a physical exam. You may also have tests that will help make a diagnosis. These may include:   A blood test.   A test using the medicine edrophonium. This medicine increases muscle strength by slowing the breakdown of acetylcholine.   Tests to measure nerve conduction to muscle (electromyography).   An imaging study of the chest (CT or MRI).    How is  this treated?  Treatment can improve muscle strength. Sometimes symptoms of MG go away for a while (remission) and you can stop treatment. Possible treatments include:   Medicine.   Removal of the thymus gland (thymectomy). This may result in a long remission for some people.    Follow these instructions at home:   Take medicines only as directed by your health care provider.   Get plenty of rest to conserve your energy.   Take frequent breaks to rest your eyes.   Maintain a healthy diet and a healthy weight.   Do not use any tobacco products including cigarettes, chewing tobacco, or electronic cigarettes. If you need help quitting, ask your health care provider.   Keep all follow-up visits as directed by your health care provider. This is important.  Contact a health care provider if:   Your symptoms get worse after a fever or infection.   You have a reaction to a medicine you are taking.   Your symptoms change or get worse.  Get help right away if:  You have trouble breathing.  This information is not intended to replace advice given to you by your health care provider. Make sure you discuss any questions you have with your health care provider.  Document Released: 11/17/2000 Document Revised: 01/17/2016 Document Reviewed: 10/12/2013  [employer] Interactive Patient Education  2018 [employer]

## 2018-01-13 NOTE — Progress Notes (Signed)
SLEEP MEDICINE CLINIC   Provider:  Larey Seat, M D  Referring Provider: Cassandria Anger, MD Primary Care Physician:  Cassandria Anger, MD  Chief Complaint  Patient presents with  . New Patient (Initial Visit)    pt with wife, went to the hospital, diagnosed with Myasthenia Gravis.    Interval history for [name] from 01-13-2018 ,post hospitalization.    The patient had knee replacement 09-25-2017 , had acute blood loss upon chronic anemia -needed iron infusion and received one on 2-16-,  and upon the second infusion 11-19-2017 developed speech and swallowing difficulties. He was in discomfort. The patient was admitted on 8 April through the [facility] long ED was transferred to [facility] and admitted.  Per neurology [place] hospitalist the patient was introduced as a 77 year old Caucasian married male with a history significant for coronary artery disease, chronic anemia, iron infusions every 3 months.  The patient presented to the ED with dysarthria dysphagia and dysarthria which started as early as 29th  March after an iron insfusion. Further iron infusions were blocked from there on given the side effects.  The patient was evaluated by ear nose and throat with Dr. Erik Obey who had recommended a CT of the neck and speech therapy as well as a neurology consult.  2D echo showed an ejection fraction of 60% carotid Dopplers were open there was some intracranial coronary artery stenosis bilaterally but not considered significant, speech therapy evaluation observed no oral issues but aspiration danger.  He was made n.p.o. A chest x-ray and 2 views showed normal heart size, a chronic elevation of the left hemidiaphragm.  White blood cell count on 10 April was 10.2 hemoglobin was 12.3 on the lower side hematocrit was normal at 39.4, glucose 152, triglycerides were elevated TSH was low, hemoglobin A1c was 4.6.  Low.  CT of the head from 8 April moderate to severe chronic small  vessel disease no acute stroke or bleed.   MRI of the brain followed on 10 April with MRA.  No stenosis no occlusion even the distal branches of the intracranial vessels were well perfused and symmetrically so.  Dominant right vertebral artery.  No large vessel occlusion.  Dr Alain Marion  has seen him for ataxia and weakness, tongue weakness. 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 Progression from there. He underwent lower back treatment, no success, knee replacement did not to make a big difference in gait and exercise tolerance  Both knees feel disconnected. Alien legs.  Dr. Alain Marion then stand for exit to clean receptor antibodies found the binding antibody to be 70% and the blood levels at 6.9 far above normal.  It is very likely that we are dealing with a geriatric form of myasthenia gravis, and for this reason I will order a chest CT to evaluate him for a thymus gland growth I also asked him not to take any anticholinergic medication which includes antihistamine such as Benadryl, and bladder toning agents such as Detrol.       Interval history from 07 September 2017.  I have the pleasure of meeting with [name] today who is here for a compliance visit on CPAP.  The patient is highly compliant with CPAP use, 93% equals 28 out of 30 days, however 7 days for just below the 4-hour mark. Average use of time is 5 hours and 22 minutes each night, he is using an AutoSet between 7  and 15 cm water with 3 cm expiratory pressure relief.  The 95th percentile pressure is 13.2 the patient still has 4.9 residual obstructive apneas and very infrequent central apneas.  His overall AHI was 6.2.  I would like to increase the maximum pressure to 16 cm water.  He wakes with a very dry mouth using a nasal mask in large size, a [employer]  Wisp in XL. His mouth will drop open.  He has biotin mouth wash, and a chin strap. Still,  much improved over the last visit  results. He is much less sleepy, more active and can watch TV without dozing of now.    I'm seeing [name] today on 03/05/2017 in a revisit. CDHe has undergone a split-night titration study on 11/17/2016 and was re- diagnosed with obstructive sleep apnea. He had mostly shallow breathing spells, all obstructive in nature, with an index of 33.3 per hour of sleep, during REM sleep his AHI was 56.3 and in supine sleeps 32.8/hr. He also describes arousals out of dream sleep sometimes with a hint of confusion. The CPAP titration did make an impact but the patient only slept 12 minutes at the 12 cm water pressure that the front most beneficial. He produced a lot of apneas in spite of being on CPAP at pressures just below. For this reason I had placed him on an auto titrating between 7 and 15 cm water with 3 cm expiratory pressure relief. The patient returns today with high pressure leaks and a high number of residual apneas at 13.9 per hour.  We have to me today to find a way to reduce his apnea to under 5.0 / hr. This seems to be a mask fit issues. The patient has a lot of air leaks and he describes discomfort from either tightening the mask too much or from having sounds produced by the air leaking. He remains fatigued with it fatigue severity score 53 and his Epworth score is elevated at 13 points. He is not depressed according to his geriatric depression scale at 2 points.   HPI: [name] is a 77 y.o. male , seen here as a referral  from Dr. Alain Marion for a sleep consultation. [name] is a 77 year old, right handed, caucasian married male patient, with a history of OSA. His original sleep study was performed at [facility] in 2000  and revealed " severe" Obstructive Sleep Apnea. He has not been able To use CPAP, but it was not that he could not tolerate the CPAP- the durable medical equipment company furnished machine went out of business was in a month or so after his  machine was issued  and he could never get supplies again. Their office was on [address] he recalls. 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 He is surprised to learn that there are nasal pillows, nasal masks etc. and how small and quiet today CPAP machines. I also explained that many CPAP machines can be used as BiPAP as well.  [name] past medical history includes spot related injuries making it necessary to undergo a total knee replacement in the year 2002, he had 3 lumbar fusions, a tonsillectomy in childhood, he has been diagnosed in the past with iron deficiency anemia, has been treated for chronic pain with narcotic medications. 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 Chief complaint according to patient : " I snore, I stop breathing, my wife is concerned about my erratic breathing"  Sleep habits are as follows:  He usually goes to bed to watch TV news -around 11.00 PM . And it will take him usually 15-30 minutes but not longer to go to sleep, and usually falls asleep on his side. He does resume a supine position later during the night. He does snore and his wife reports that breathing is worse when supine position. He sleeps on only one pillow but elevates the head of bed with a wedge. His bedroom is described as cool, quiet and dark. Nocturia 3 or 4 at night. He wakes with hand and leg pain, cramping.  He rises at 8 AM, and feels " OK ", but craves more sleep. He wakes without headaches, but feels stiff.    Sleep medical history and family sleep history:  Tonsillectomy in childhood. Social history: ex smoker , since 65, ETOH ,  Wine once a month , caffeine : coffee 4-6  cups, iced tea 1 glas at dinner, Soda - rarely. No shift work history, worked as a Dealer.   Review of Systems: Out of a complete 14 system review,  the patient complains of only the following symptoms, and all other reviewed systems are negative.   Epworth score 11 from 12 , Fatigue severity score 35 from 46 , depression score 2/15 .    Social History   Socioeconomic History  . Marital status: Married    Spouse name: Not on file  . Number of children: Not on file  . Years of education: Not on file  . Highest education level: Not on file  Occupational History  . Occupation: Retired    Fish farm manager: RETIRED    CommentDatabase administrator  Social Needs  . Financial resource strain: Not on file  . Food insecurity:    Worry: Not on file    Inability: Not on file  . Transportation needs:    Medical: Not on file    Non-medical: Not on file  Tobacco Use  . Smoking status: Former Smoker    Last attempt to quit: 01/08/1987    Years since quitting: 31.0  . Smokeless tobacco: Never Used  Substance and Sexual Activity  . Alcohol use: Yes    Alcohol/week: 0.0 oz    Comment: rare  . Drug use: No  . Sexual activity: Not Currently  Lifestyle  . Physical activity:    Days per week: Not on file    Minutes per session: Not on file  . Stress: Not on file  Relationships  . Social connections:    Talks on phone: Not on file    Gets together: Not on file    Attends religious service: Not on file    Active member of club or organization: Not on file    Attends meetings of clubs or organizations: Not on file    Relationship status: Not on file  . Intimate partner violence:    Fear of current or ex partner: Not on file    Emotionally abused: Not on file    Physically abused: Not on file    Forced sexual activity: Not on file  Other Topics Concern  . Not on file  Social History Narrative   He lives with wife in a 2 story home.  Has 2 children.   Retired Dealer.   Highest level  of education:  High school   Regular Exercise -  YES    Family History  Problem Relation Age of Onset  . Aneurysm Father        AAA  . Heart disease Father 2         CHF  . Alzheimer's disease Father   . Cancer Brother   . Alzheimer's disease Brother        dementia  . Stomach cancer Brother   . Cancer Mother 57       breast cancer  . Heart disease Mother 96       MI  . Autoimmune disease Son   . CAD Brother   . Colon cancer Neg Hx   . Esophageal cancer Neg Hx   . Pancreatic cancer Neg Hx   . Prostate cancer Neg Hx   . Rectal cancer Neg Hx     Past Medical History:  Diagnosis Date  . Anemia   . Blood transfusion without reported diagnosis 02/23/2014   3 units for iron deficiency anemia. as a baby - whenhe had pneumonia  . BPH (benign prostatic hyperplasia)   . CTS (carpal tunnel syndrome)    better  . Dyspnea   . GERD (gastroesophageal reflux disease)   . Hyperlipidemia   . Hypertension    "THEY DIAGNOSED ME YEARS AGO BUT LATELY IVE ACTUALLY HAD LOW BLOOD PRESSURES "   . Insomnia   . LBP (low back pain)   . Osteoarthritis   . Pneumonia    as a baby  . Sleep apnea    no cpap    Past Surgical History:  Procedure Laterality Date  . CARDIAC CATHETERIZATION N/A 08/15/2015   Procedure: Left Heart Cath and Coronary Angiography;  Surgeon: Lorretta Harp, MD;  Location: [facility];  Service: Cardiovascular;  Laterality: N/A;  . CARPAL TUNNEL RELEASE Bilateral 1996, 2004   Dr Lenoard Aden thumb surgery.  more than once  . COLONOSCOPY  2005, 2012   diverticulosis.   Marland Kitchen ESOPHAGOGASTRODUODENOSCOPY N/A 02/23/2014   Procedure: ESOPHAGOGASTRODUODENOSCOPY (EGD);  Surgeon: Irene Shipper, MD;  Location: [facility];  Service: Endoscopy;  Laterality: N/A;  . EYE SURGERY  2012   both cataracts, Lasik  . LEFT KNEE ARTHROSCOPY Left 07/16/2017    [facility]   . LUMBAR FUSION     x 3  . REVERSE SHOULDER ARTHROPLASTY Left 07/04/2016   Procedure: LEFT REVERSE SHOULDER ARTHROPLASTY;  Surgeon: Netta Cedars, MD;  Location: [facility];  Service: Orthopedics;  Laterality: Left;  . TONSILLECTOMY  1946  . TOTAL KNEE ARTHROPLASTY  2003   Right  .  TOTAL KNEE ARTHROPLASTY Left 09/25/2017   Procedure: LEFT TOTAL KNEE ARTHROPLASTY;  Surgeon: Sydnee Cabal, MD;  Location: [facility];  Service: Orthopedics;  Laterality: Left;    Current Outpatient Medications  Medication Sig Dispense Refill  . aspirin EC 81 MG tablet Take 1 tablet (81 mg total) by mouth daily. 100 tablet 3  . Cholecalciferol (VITAMIN D3) 2000 units TABS Take 2,000 Units by mouth at bedtime.    . Coenzyme Q10 (CO Q-10 PO) Take 1 tablet by mouth at bedtime.    . Cyanocobalamin (VITAMIN B-12) 1000 MCG SUBL Place 1 tablet (1,000 mcg total) under the tongue daily. (Patient taking differently: Place 1,000 mcg under the tongue at bedtime. ) 100 tablet 3  . DULoxetine (CYMBALTA) 60 MG capsule TAKE 1 CAPSULE BY MOUTH  DAILY 90 capsule 1  . finasteride (PROSCAR) 5 MG tablet TAKE  1 TABLET BY MOUTH  DAILY (Patient taking differently: TAKE 1 TABLET (5 MG) BY MOUTH  DAILY AT NIGHT) 90 tablet 3  . folic acid (V-R FOLIC ACID) 267 MCG tablet Take 1 tablet (400 mcg total) by mouth daily. (Patient taking differently: Take 400 mcg by mouth at bedtime. ) 90 tablet 3  . furosemide (LASIX) 20 MG tablet TAKE 1 TO 2 TABLETS BY  MOUTH DAILY AS NEEDED FOR  EDEMA (Patient taking differently: TAKE 1 TABLET (20 MG) BY  MOUTH DAILY IN THE EVENING) 180 tablet 3  . LORazepam (ATIVAN) 1 MG tablet Take 1 tablet (1 mg total) by mouth 2 (two) times daily as needed for anxiety or sleep. 60 tablet 1  . Polyethyl Glycol-Propyl Glycol (LUBRICANT EYE DROPS) 0.4-0.3 % SOLN Place 1-2 drops into both eyes daily.    . ranitidine (ZANTAC) 150 MG tablet Take 1 tablet (150 mg total) by mouth 2 (two) times daily. (Patient taking differently: Take 150 mg by mouth at bedtime. ) 180 tablet 3  . senna-docusate (SENOKOT-S) 8.6-50 MG tablet Take 1 tablet by mouth at bedtime as needed for mild constipation.    . tamsulosin (FLOMAX) 0.4 MG CAPS capsule TAKE 2 CAPSULES BY MOUTH  DAILY 180 capsule 3  . vitamin C (ASCORBIC ACID) 500 MG tablet  Take 1 tablet (500 mg total) by mouth daily. (Patient taking differently: Take 500 mg by mouth at bedtime. ) 100 tablet 1   No current facility-administered medications for this visit.     Allergies as of 01/13/2018  . (No Known Allergies)    Vitals: BP 112/70   Pulse 63   Ht 6' (1.829 m)   Wt 236 lb (107 kg)   BMI 32.01 kg/m    Last Weight:  Wt Readings from Last 1 Encounters:  01/13/18 236 lb (107 kg)   TIW:PYKD mass index is 32.01 kg/m.     Last Height:   Ht Readings from Last 1 Encounters:  01/13/18 6' (1.829 m)    Physical exam:  General: The patient is awake, alert and is well groomed. Head: Normocephalic, atraumatic.  Neck is supple. Mallampati 3-4  ,  neck circumference:19. Nasal airflow patent , TMJ click evident . Retrognathia is not seen.  Cardiovascular:  Regular rate and rhythm , without  murmurs or carotid bruit, and without distended neck veins. Respiratory: Lungs are clear to auscultation. Skin:  He has a bronzed skin color-  he is pale and appears icteric.  He looks sickly.  Trunk: BMI is 33  Neurologic exam : The patient is awake and alert. Speech is fluent, with dysphonia. Mood and affect are appropriate.  Cranial nerves: Pupils are equal , EOM without nystagmus. Visual fields by finger perimetry are intact. Left sided ptosis.  Hearing to finger rub intact. Facial sensation intact to fine touch. Facial motor strength: facial expression is symmetric, his tongue and uvula move midline. Shoulder shrug was weaker on the left. The deep tendon reflexes are preserved and the patellar reflexes actually slightly more brisk on the left than right, over the knee that was just replaced.  I watch the patient walk and he did not show a lot of global rotation, he turned with 4 steps his gait was not wide-based but his step with was reduced, he takes smaller steps very careful deliberately looking at his feet as he walks, he did not provide any evidence of parkinsonism  there was no shuffling, no tremor my impression was more that of a  spinal stenosis as a possible gait disorder.  Given that the patient has rather focal hip abduction hip flexion and abduction weakness as well as having overcome weakness of the tongue and lips and in light of the positive attitude usually receptor antibodies I will order the rest of the myasthenia work-up and start him on Mestinon.   I spent more than 40 minutes of face to face time with the patient. Greater than 50% of time was spent in counseling and coordination of care. We have discussed the diagnosis and differential and I answered the patient's questions.    Myasthenia gravis blood panel positive,  I will order a chest CT to evaluate for thymus growth,  I repeat myasthenia panel with all types of Ab,  I start mestinon tid  I will start cellcept .    Assessment:     No Detrol, etc, no benadryl, etc.  No antihistamines.    Rv in 2-3 month with me or NP.    Lee Partridge Kieren Ricci MD  01/13/2018   CC: Cassandria Anger, Md [address] [place], [place] [address]

## 2018-01-14 ENCOUNTER — Other Ambulatory Visit: Payer: Self-pay | Admitting: Neurology

## 2018-01-14 DIAGNOSIS — G7000 Myasthenia gravis without (acute) exacerbation: Secondary | ICD-10-CM

## 2018-01-14 DIAGNOSIS — R29898 Other symptoms and signs involving the musculoskeletal system: Secondary | ICD-10-CM

## 2018-01-14 NOTE — Telephone Encounter (Signed)
Order placed

## 2018-01-14 NOTE — Telephone Encounter (Signed)
I spoke to [place] at [facility] and I informed her it was for thymus growth she stated the order should be a CT Chest with contrast. Because the CT chest high resolution is for lung cancer.

## 2018-01-14 NOTE — Telephone Encounter (Signed)
Miriam with [facility] informed me the CT should be switched to a CT chest wo contrast. When you get a chance can you switch it for me thank you.

## 2018-01-21 ENCOUNTER — Institutional Professional Consult (permissible substitution): Payer: Medicare Other | Admitting: Neurology

## 2018-01-26 ENCOUNTER — Telehealth: Payer: Self-pay | Admitting: Neurology

## 2018-01-26 ENCOUNTER — Ambulatory Visit
Admission: RE | Admit: 2018-01-26 | Discharge: 2018-01-26 | Disposition: A | Discharge status: Home / Self Care | Payer: Medicare Other | Source: Ambulatory Visit | Attending: Neurology | Admitting: Neurology

## 2018-01-26 DIAGNOSIS — R29898 Other symptoms and signs involving the musculoskeletal system: Secondary | ICD-10-CM

## 2018-01-26 DIAGNOSIS — G7000 Myasthenia gravis without (acute) exacerbation: Secondary | ICD-10-CM

## 2018-01-26 NOTE — Telephone Encounter (Signed)
Pts wife [name] called stating that the pts CT scan had been canceled due to pt having one prior to office visit with Dr. Angelena Form requesting a call to discuss the results stating they had been sent to our office

## 2018-01-26 NOTE — Telephone Encounter (Signed)
Called the wife back and informed her that Dr Brett Fairy when she ordered the CT chest was unavailable to see all the information she needed to confirm there was no thyoma which is why she ordered this test. They radiologist called our office and brought to our attention that she should have been able to view that on the exam. Dr Dohmeier still had difficulties with seeing the images and she requested there radiologist look at the one in April and see if they see a thyoma. They confirmed they did not and stated they would addend the report to show that information. Pt verbalized understanding and was informed that this was not seen. The test today was cancelled

## 2018-01-28 ENCOUNTER — Telehealth: Payer: Self-pay | Admitting: Neurology

## 2018-01-28 NOTE — Telephone Encounter (Signed)
Spoke to patient's wife; CT chest was cancelled, no thymoma was found on scan from April. He tolerated mestinon without any GI symptoms, left eye droop improved a little.   Sadly his son [name] died age 77  last November 24, 2022 in [place], [place], unexpectedly. He is under a lot of stress.    I offered to see [name] on Friday , 6-14 between 10 and 12 AM- for MOCA,MMSE and to check on ptosis.  Myriam Jacobson, please put him on my schedule. Thank you

## 2018-01-28 NOTE — Telephone Encounter (Signed)
Called the pt and they will come in June 14,2019 at 11:00 with a 10:30 check in.

## 2018-01-30 ENCOUNTER — Other Ambulatory Visit: Payer: Self-pay | Admitting: Internal Medicine

## 2018-02-04 ENCOUNTER — Other Ambulatory Visit: Payer: Self-pay | Admitting: Neurology

## 2018-02-05 ENCOUNTER — Ambulatory Visit: Payer: Medicare Other | Admitting: Neurology

## 2018-02-05 ENCOUNTER — Encounter: Payer: Self-pay | Admitting: Neurology

## 2018-02-05 VITALS — BP 109/62 | HR 76 | Ht 72.0 in | Wt 236.0 lb

## 2018-02-05 DIAGNOSIS — Z79899 Other long term (current) drug therapy: Secondary | ICD-10-CM

## 2018-02-05 DIAGNOSIS — G7000 Myasthenia gravis without (acute) exacerbation: Secondary | ICD-10-CM | POA: Diagnosis not present

## 2018-02-05 MED ORDER — MYCOPHENOLATE MOFETIL 250 MG PO CAPS: 250.0000 mg | ORAL_CAPSULE | Freq: Two times a day (BID) | ORAL | 1 refills | Status: DC

## 2018-02-05 NOTE — Patient Instructions (Signed)
Mycophenolate capsules What is this medicine? MYCOPHENOLATE MOFETIL (mye koe FEN oh late MOE fe til) is used to decrease the immune system's response to a transplanted organ. This medicine may be used for other purposes; ask your health care provider or pharmacist if you have questions. COMMON BRAND NAME(S): CellCept What should I tell my health care provider before I take this medicine? They need to know if you have any of these conditions: -anemia or other blood disorder -diarrhea -immune system problems -infection -kidney disease -phenylketonuria -stomach problems -an unusual or allergic reaction to mycophenolate mofetil, other medicines, foods, dyes, or preservatives -pregnant or trying to get pregnant -breast-feeding How should I use this medicine? Take this medicine by mouth with a full glass of water. Follow the directions on the prescription label. Take this medicine on an empty stomach, at least 1 hour before or 2 hours after food. Do not take with food unless your doctor approves. Swallow the medicine whole. Do not cut, crush, or chew the medicine. If the medicine is broken or is not intact, do not get the powder on your skin or eyes. If contact occurs, rinse thoroughly with water. Take your medicine at regular intervals. Do not take your medicine more often than directed. Do not stop taking except on your doctor's advice. A special MedGuide will be given to you by the pharmacist with each prescription and refill. Be sure to read this information carefully each time. Talk to your pediatrician regarding the use of this medicine in children. Special care may be needed. Overdosage: If you think you have taken too much of this medicine contact a poison control center or emergency room at once. NOTE: This medicine is only for you. Do not share this medicine with others. What if I miss a dose? If you miss a dose, take it as soon as you can. If it is almost time for your next dose, take  only that dose. Do not take double or extra doses. What may interact with this medicine? -acyclovir or valacyclovir -antacids -azathioprine -birth control pills -certain antibiotics like ciprofloxacin and amoxicillin; clavulanic acid -ganciclovir or valganciclovir -lanthanum carbonate -medicines for cholesterol like cholestyramine and colestipol -metronidazole -norfloxacin -other mycophenolate medicines -probenecid -rifampin -sevelamer -vaccines This list may not describe all possible interactions. Give your health care provider a list of all the medicines, herbs, non-prescription drugs, or dietary supplements you use. Also tell them if you smoke, drink alcohol, or use illegal drugs. Some items may interact with your medicine. What should I watch for while using this medicine? Visit your doctor or health care professional for regular checks on your progress. You will need frequent blood checks during the first few months you are receiving the medicine. This medicine can make you more sensitive to the sun. Keep out of the sun. If you cannot avoid being in the sun, wear protective clothing and use sunscreen. Do not use sun lamps or tanning beds/booths. This medicine can cause birth defects. Do not get pregnant while taking this drug. Females will need to have a negative pregnancy test before starting this medicine. If sexually active, use 2 reliable forms of birth control together for 4 weeks before starting this medicine, while you are taking this medicine, and for 6 weeks after you stop taking this medicine. Birth control pills alone may not work properly while you are taking this medicine. If you think that you might be pregnant talk to your doctor right away. If you get a cold or other  infection while receiving this medicine, call your doctor or health care professional. Do not treat yourself. The medicine may decrease your body's ability to fight infections. What side effects may I notice  from receiving this medicine? Side effects that you should report to your doctor or health care professional as soon as possible: -allergic reactions like skin rash, itching or hives, swelling of the face, lips, or tongue -bloody, dark, or tarry stools -changes in vision -dizziness -fever, chills or any other sign of infection -unusual bleeding or bruising -unusually weak or tired Side effects that usually do not require medical attention (report to your doctor or health care professional if they continue or are bothersome): -constipation -diarrhea -difficulty sleeping -loss of appetite -nausea, vomiting This list may not describe all possible side effects. Call your doctor for medical advice about side effects. You may report side effects to FDA at 1-800-FDA-1088. Where should I keep my medicine? Keep out of the reach of children. Store at room temperature between 15 and 30 degrees C (59 and 86 degrees F). Throw away any unused medicine after the expiration date. NOTE: This sheet is a summary. It may not cover all possible information. If you have questions about this medicine, talk to your doctor, pharmacist, or health care provider.  2018 Elsevier/Gold Standard (2008-02-21 09:25:30)

## 2018-02-05 NOTE — Progress Notes (Signed)
SLEEP MEDICINE CLINIC   Provider:  Larey Seat, M D  Referring Provider: Cassandria Anger, MD Primary Care Physician:  Cassandria Anger, MD  Chief Complaint  Patient presents with  . Follow-up    pt with wife, pt states memory has been off. pt has lost a son since that last visit and the wife notices there has been some memory    02-05-2018, Mestinon has helped to cure his constipation, he has no longer ptosis, has been less weak. He is swallowing is improved. Mestinon to be taken before meals tid.  Top lip feels swollen  He lost is son Legrand Como at age 63 to the symptoms of Crohn's disease, next Saturday grave side memorial. He has noted more memory problems.  His gait is still impaired,but it seems not to be myasthenia, the left knee is swollen and stiff, knee replacement in feb 2019. He walked really well when he took prednisone.  Dr .Theda Sers will follow in July.   He is deeply affected. Grieving.  MOCA 22-24. MMSE 25/30.  CT chest - no evidence of thymoma. I am hesitant to start prednisone, would rather use cellcept.    Interval history for Mr. Surprenant from 01-13-2018 ,post hospitalization.  The patient had knee replacement 09-25-2017 , had acute blood loss upon chronic anemia -needed iron infusion and received one on 2-16-,  and upon the second infusion 11-19-2017 developed speech and swallowing difficulties. He was in discomfort. The patient was admitted on 8 April through the [facility] long ED was transferred to [facility] and admitted.  Per neurology [place] hospitalist the patient was introduced as a 77 year old Caucasian married male with a history significant for coronary artery disease, chronic anemia, iron infusions every 3 months.  The patient presented to the ED with dysarthria dysphagia and dysarthria which started as early as 29th  March after an iron insfusion. Further iron infusions were blocked from there on given the side effects.  The patient was  evaluated by ear nose and throat with Dr. Erik Obey who had recommended a CT of the neck and speech therapy as well as a neurology consult.  2D echo showed an ejection fraction of 60% carotid Dopplers were open there was some intracranial coronary artery stenosis bilaterally but not considered significant, speech therapy evaluation observed no oral issues but aspiration danger.  He was made n.p.o. A chest x-ray and 2 views showed normal heart size, a chronic elevation of the left hemidiaphragm.  White blood cell count on 10 April was 10.2 hemoglobin was 12.3 on the lower side hematocrit was normal at 39.4, glucose 152, triglycerides were elevated TSH was low, hemoglobin A1c was 4.6.  Low.  CT of the head from 8 April moderate to severe chronic small vessel disease no acute stroke or bleed.   MRI of the brain followed on 10 April with MRA.  No stenosis no occlusion even the distal branches of the intracranial vessels were well perfused and symmetrically so.  Dominant right vertebral artery.  No large vessel occlusion.  Dr Alain Marion  has seen him for ataxia and weakness, tongue weakness. 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 Progression from there. He underwent lower back treatment, no success, knee replacement did not to make a big difference in gait and exercise tolerance  Both knees feel disconnected. Alien legs.  Dr. Alain Marion then stand for exit to clean receptor antibodies found the binding antibody to be  70% and the blood levels at 6.9 far above normal.  It is very likely that we are dealing with a geriatric form of myasthenia gravis, and for this reason I will order a chest CT to evaluate him for a thymus gland growth I also asked him not to take any anticholinergic medication which includes antihistamine such as Benadryl, and bladder toning agents such as Detrol.       Interval history from 07 September 2017.  I have the pleasure of meeting with Mr.  [name] today who is here for a compliance visit on CPAP.  The patient is highly compliant with CPAP use, 93% equals 28 out of 30 days, however 7 days for just below the 4-hour mark. Average use of time is 5 hours and 22 minutes each night, he is using an AutoSet between 7 and 15 cm water with 3 cm expiratory pressure relief.  The 95th percentile pressure is 13.2 the patient still has 4.9 residual obstructive apneas and very infrequent central apneas.  His overall AHI was 6.2.  I would like to increase the maximum pressure to 16 cm water.  He wakes with a very dry mouth using a nasal mask in large size, a [employer]  Wisp in XL. His mouth will drop open.  He has biotin mouth wash, and a chin strap. Still,  much improved over the last visit results. He is much less sleepy, more active and can watch TV without dozing of now.    I'm seeing Mr. Dufresne today on 03/05/2017 in a revisit. CDHe has undergone a split-night titration study on 11/17/2016 and was re- diagnosed with obstructive sleep apnea. He had mostly shallow breathing spells, all obstructive in nature, with an index of 33.3 per hour of sleep, during REM sleep his AHI was 56.3 and in supine sleeps 32.8/hr. He also describes arousals out of dream sleep sometimes with a hint of confusion. The CPAP titration did make an impact but the patient only slept 12 minutes at the 12 cm water pressure that the front most beneficial. He produced a lot of apneas in spite of being on CPAP at pressures just below. For this reason I had placed him on an auto titrating between 7 and 15 cm water with 3 cm expiratory pressure relief. The patient returns today with high pressure leaks and a high number of residual apneas at 13.9 per hour.  We have to me today to find a way to reduce his apnea to under 5.0 / hr. This seems to be a mask fit issues. The patient has a lot of air leaks and he describes discomfort from either tightening the mask too much or from  having sounds produced by the air leaking. He remains fatigued with it fatigue severity score 53 and his Epworth score is elevated at 13 points. He is not depressed according to his geriatric depression scale at 2 points.   HPI: EMARION TORAL is a 77 y.o. male , seen here as a referral  from Dr. Alain Marion for a sleep consultation. [name] is a 77 year old, right handed, caucasian married male patient, with a history of OSA. His original sleep study was performed at [facility] on [facility] in 2000  and revealed " severe" Obstructive Sleep Apnea. He has not been able To use CPAP, but it was not that he could not tolerate the CPAP- the durable medical equipment company furnished machine went out of business was in a month or  so after his machine was issued  and he could never get supplies again. Their office was on [address] he recalls. 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 He is surprised to learn that there are nasal pillows, nasal masks etc. and how small and quiet today CPAP machines. I also explained that many CPAP machines can be used as BiPAP as well.  [name] past medical history includes spot related injuries making it necessary to undergo a total knee replacement in the year 2002, he had 3 lumbar fusions, a tonsillectomy in childhood, he has been diagnosed in the past with iron deficiency anemia, has been treated for chronic pain with narcotic medications. 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 Chief complaint according to patient : " I snore, I stop breathing, my wife is concerned about my erratic breathing"  Sleep habits are as follows:  He usually goes to bed to watch TV news -around 11.00 PM . And it will take him usually 15-30 minutes but not longer to go to sleep, and usually falls asleep on his side.  He does resume a supine position later during the night. He does snore and his wife reports that breathing is worse when supine position. He sleeps on only one pillow but elevates the head of bed with a wedge. His bedroom is described as cool, quiet and dark. Nocturia 3 or 4 at night. He wakes with hand and leg pain, cramping.  He rises at 8 AM, and feels " OK ", but craves more sleep. He wakes without headaches, but feels stiff.    Sleep medical history and family sleep history:  Tonsillectomy in childhood. Social history: ex smoker , since 89, ETOH ,  Wine once a month , caffeine : coffee 4-6  cups, iced tea 1 glas at dinner, Soda - rarely. No shift work history, worked as a Dealer.   Review of Systems: Out of a complete 14 system review, the patient complains of only the following symptoms, and all other reviewed systems are negative.   Epworth score 11 from 12 , Fatigue severity score 35 from 46 , depression score 2/15 .    Social History   Socioeconomic History  . Marital status: Married    Spouse name: Not on file  . Number of children: Not on file  . Years of education: Not on file  . Highest education level: Not on file  Occupational History  . Occupation: Retired    Fish farm manager: RETIRED    CommentDatabase administrator  Social Needs  . Financial resource strain: Not on file  . Food insecurity:    Worry: Not on file    Inability: Not on file  . Transportation needs:    Medical: Not on file    Non-medical: Not on file  Tobacco Use  . Smoking status: Former Smoker    Last attempt to quit: 01/08/1987    Years since quitting: 31.0  . Smokeless tobacco: Never Used  Substance and Sexual Activity  . Alcohol use: Yes    Alcohol/week: 0.0 oz    Comment: rare  . Drug use: No  . Sexual activity: Not Currently  Lifestyle  . Physical activity:    Days per week: Not on file    Minutes per session: Not on file  . Stress: Not on  file  Relationships  . Social connections:    Talks on  phone: Not on file    Gets together: Not on file    Attends religious service: Not on file    Active member of club or organization: Not on file    Attends meetings of clubs or organizations: Not on file    Relationship status: Not on file  . Intimate partner violence:    Fear of current or ex partner: Not on file    Emotionally abused: Not on file    Physically abused: Not on file    Forced sexual activity: Not on file  Other Topics Concern  . Not on file  Social History Narrative   He lives with wife in a 2 story home.  Has 2 children.   Retired Dealer.   Highest level of education:  High school   Regular Exercise -  YES    Family History  Problem Relation Age of Onset  . Aneurysm Father        AAA  . Heart disease Father 62       CHF  . Alzheimer's disease Father   . Cancer Brother   . Alzheimer's disease Brother        dementia  . Stomach cancer Brother   . Cancer Mother 38       breast cancer  . Heart disease Mother 107       MI  . Autoimmune disease Son   . CAD Brother   . Colon cancer Neg Hx   . Esophageal cancer Neg Hx   . Pancreatic cancer Neg Hx   . Prostate cancer Neg Hx   . Rectal cancer Neg Hx     Past Medical History:  Diagnosis Date  . Anemia   . Blood transfusion without reported diagnosis 02/23/2014   3 units for iron deficiency anemia. as a baby - whenhe had pneumonia  . BPH (benign prostatic hyperplasia)   . CTS (carpal tunnel syndrome)    better  . Dyspnea   . GERD (gastroesophageal reflux disease)   . Hyperlipidemia   . Hypertension    "THEY DIAGNOSED ME YEARS AGO BUT LATELY IVE ACTUALLY HAD LOW BLOOD PRESSURES "   . Insomnia   . LBP (low back pain)   . Osteoarthritis   . Pneumonia    as a baby  . Sleep apnea    no cpap    Past Surgical History:  Procedure Laterality Date  . CARDIAC CATHETERIZATION N/A 08/15/2015   Procedure: Left Heart Cath and Coronary Angiography;  Surgeon: Lorretta Harp, MD;  Location: [facility];  Service: Cardiovascular;  Laterality: N/A;  . CARPAL TUNNEL RELEASE Bilateral 1996, 2004   Dr Lenoard Aden thumb surgery.  more than once  . COLONOSCOPY  2005, 2012   diverticulosis.   Marland Kitchen ESOPHAGOGASTRODUODENOSCOPY N/A 02/23/2014   Procedure: ESOPHAGOGASTRODUODENOSCOPY (EGD);  Surgeon: Irene Shipper, MD;  Location: [facility];  Service: Endoscopy;  Laterality: N/A;  . EYE SURGERY  2012   both cataracts, Lasik  . LEFT KNEE ARTHROSCOPY Left 07/16/2017    [facility]   . LUMBAR FUSION     x 3  . REVERSE SHOULDER ARTHROPLASTY Left 07/04/2016   Procedure: LEFT REVERSE SHOULDER ARTHROPLASTY;  Surgeon: Netta Cedars, MD;  Location: [facility];  Service: Orthopedics;  Laterality: Left;  . TONSILLECTOMY  1946  . TOTAL KNEE ARTHROPLASTY  2003   Right  . TOTAL KNEE ARTHROPLASTY Left 09/25/2017  Procedure: LEFT TOTAL KNEE ARTHROPLASTY;  Surgeon: Sydnee Cabal, MD;  Location: [facility];  Service: Orthopedics;  Laterality: Left;    Current Outpatient Medications  Medication Sig Dispense Refill  . aspirin EC 81 MG tablet Take 1 tablet (81 mg total) by mouth daily. 100 tablet 3  . Cholecalciferol (VITAMIN D3) 2000 units TABS Take 2,000 Units by mouth at bedtime.    . Coenzyme Q10 (CO Q-10 PO) Take 1 tablet by mouth at bedtime.    . Cyanocobalamin (VITAMIN B-12) 1000 MCG SUBL Place 1 tablet (1,000 mcg total) under the tongue daily. (Patient taking differently: Place 1,000 mcg under the tongue at bedtime. ) 100 tablet 3  . DULoxetine (CYMBALTA) 60 MG capsule TAKE 1 CAPSULE BY MOUTH  DAILY 90 capsule 1  . finasteride (PROSCAR) 5 MG tablet TAKE 1 TABLET BY MOUTH  DAILY (Patient taking differently: TAKE 1 TABLET (5 MG) BY MOUTH  DAILY AT NIGHT) 90 tablet 3  . folic acid (V-R FOLIC ACID) 976 MCG tablet Take 1 tablet (400 mcg total) by mouth daily. (Patient taking differently: Take 400 mcg by mouth at bedtime. ) 90 tablet 3  . furosemide (LASIX) 20 MG tablet TAKE 1 TO 2 TABLETS BY  MOUTH DAILY AS NEEDED FOR  EDEMA  (Patient taking differently: TAKE 1 TABLET (20 MG) BY  MOUTH DAILY IN THE EVENING) 180 tablet 3  . LORazepam (ATIVAN) 1 MG tablet Take 1 tablet (1 mg total) by mouth 2 (two) times daily as needed for anxiety or sleep. 60 tablet 1  . Polyethyl Glycol-Propyl Glycol (LUBRICANT EYE DROPS) 0.4-0.3 % SOLN Place 1-2 drops into both eyes daily.    Marland Kitchen pyridostigmine (MESTINON) 60 MG tablet TAKE 1 TABLET (60 MG TOTAL) BY MOUTH 3 (THREE) TIMES DAILY. 90 tablet 3  . ranitidine (ZANTAC) 150 MG tablet Take 1 tablet (150 mg total) by mouth 2 (two) times daily. (Patient taking differently: Take 150 mg by mouth at bedtime. ) 180 tablet 3  . senna-docusate (SENOKOT-S) 8.6-50 MG tablet Take 1 tablet by mouth at bedtime as needed for mild constipation.    . tamsulosin (FLOMAX) 0.4 MG CAPS capsule TAKE 2 CAPSULES BY MOUTH  DAILY 180 capsule 3  . vitamin C (ASCORBIC ACID) 500 MG tablet Take 1 tablet (500 mg total) by mouth daily. (Patient taking differently: Take 500 mg by mouth at bedtime. ) 100 tablet 1  . pravastatin (PRAVACHOL) 20 MG tablet TAKE 1 TABLET BY MOUTH  DAILY (Patient not taking: Reported on 02/05/2018) 90 tablet 3   No current facility-administered medications for this visit.     Allergies as of 02/05/2018  . (No Known Allergies)    Vitals: BP 109/62   Pulse 76   Ht 6' (1.829 m)   Wt 236 lb (107 kg)   BMI 32.01 kg/m    Last Weight:  Wt Readings from Last 1 Encounters:  02/05/18 236 lb (107 kg)   BHA:LPFX mass index is 32.01 kg/m.     Last Height:   Ht Readings from Last 1 Encounters:  02/05/18 6' (1.829 m)    Physical exam:  General: The patient is awake, alert and is well groomed. Head: Normocephalic, atraumatic.  Neck is supple. Mallampati 3-4  ,  neck circumference:19. Nasal airflow patent , TMJ click evident . Retrognathia is not seen.  Cardiovascular:  Regular rate and rhythm , without  murmurs or carotid bruit, and without distended neck veins. Respiratory: Lungs are clear to  auscultation. Skin:  He  has a bronzed skin color-  And looks healthier.  Trunk: BMI is 33  Neurologic exam : The patient is awake and alert. Speech is fluent, with hoarseness, dysphonia. Mood and affect are depressed.  Cranial nerves: Pupils are equal , EOM without nystagmus. Left sided ptosis.  Hearing to finger rub intact. Facial sensation intact to fine touch. Facial motor strength: facial expression is symmetric, his tongue and uvula move midline. Shoulder shrug was weaker on the left. The deep tendon reflexes are preserved and the patellar reflexes actually slightly more brisk on the left than right, over the knee that was just replaced.  I watch the patient walk and he did not show a lot of global rotation, he turned with 4 steps his gait was not wide-based but his step with was reduced, he takes smaller steps very careful deliberately looking at his feet as he walks, he did not provide any evidence of parkinsonism there was no shuffling, no tremor my impression was more that of a spinal stenosis as a possible gait disorder.   Given that the patient has rather focal hip abduction hip flexion and abduction weakness as well as having overcome weakness of the tongue and lips and in light of the positive attitude usually receptor antibodies I will order the rest of the myasthenia work-up and start him on Mestinon.   I spent more than 40 minutes of face to face time with the patient. Greater than 50% of time was spent in counseling and coordination of care. We have discussed the diagnosis and differential and I answered the patient's questions.    Myasthenia gravis blood panel positive,  I will order a chest CT to evaluate for thymus growth,  I repeat myasthenia panel with all types of Ab,  I start mestinon tid  I will start cellcept .    Assessment:     No Detrol, etc, no benadryl, etc.  No antihistamines.     Rv in 2-3 month with me or NP.    Asencion Partridge Kambri Dismore  MD  02/05/2018   CC: Cassandria Anger, Md [address] [place], [place] [address]

## 2018-02-06 LAB — CBC WITH DIFFERENTIAL/PLATELET
Basophils Absolute: 0 10*3/uL (ref 0.0–0.2)
Basos: 1 %
EOS (ABSOLUTE): 0.2 10*3/uL (ref 0.0–0.4)
Eos: 4 %
Hematocrit: 34.6 % — ABNORMAL LOW (ref 37.5–51.0)
Hemoglobin: 11.2 g/dL — ABNORMAL LOW (ref 13.0–17.7)
Immature Grans (Abs): 0 10*3/uL (ref 0.0–0.1)
Immature Granulocytes: 0 %
Lymphocytes Absolute: 1.3 10*3/uL (ref 0.7–3.1)
Lymphs: 29 %
MCH: 26.9 pg (ref 26.6–33.0)
MCHC: 32.4 g/dL (ref 31.5–35.7)
MCV: 83 fL (ref 79–97)
Monocytes Absolute: 0.6 10*3/uL (ref 0.1–0.9)
Monocytes: 15 %
Neutrophils Absolute: 2.2 10*3/uL (ref 1.4–7.0)
Neutrophils: 51 %
Platelets: 268 10*3/uL (ref 150–450)
RBC: 4.16 x10E6/uL (ref 4.14–5.80)
RDW: 15.7 % — ABNORMAL HIGH (ref 12.3–15.4)
WBC: 4.3 10*3/uL (ref 3.4–10.8)

## 2018-02-06 LAB — COMPREHENSIVE METABOLIC PANEL
ALT: 12 IU/L (ref 0–44)
AST: 17 IU/L (ref 0–40)
Albumin/Globulin Ratio: 2.8 — ABNORMAL HIGH (ref 1.2–2.2)
Albumin: 4.4 g/dL (ref 3.5–4.8)
Alkaline Phosphatase: 73 IU/L (ref 39–117)
BUN/Creatinine Ratio: 17 (ref 10–24)
BUN: 11 mg/dL (ref 8–27)
Bilirubin Total: 0.4 mg/dL (ref 0.0–1.2)
CO2: 24 mmol/L (ref 20–29)
Calcium: 9.8 mg/dL (ref 8.6–10.2)
Chloride: 103 mmol/L (ref 96–106)
Creatinine, Ser: 0.65 mg/dL — ABNORMAL LOW (ref 0.76–1.27)
GFR calc Af Amer: 109 mL/min/{1.73_m2} (ref >59)
GFR calc non Af Amer: 95 mL/min/{1.73_m2} (ref >59)
Globulin, Total: 1.6 g/dL (ref 1.5–4.5)
Glucose: 123 mg/dL — ABNORMAL HIGH (ref 65–99)
Potassium: 4.2 mmol/L (ref 3.5–5.2)
Sodium: 144 mmol/L (ref 134–144)
Total Protein: 6 g/dL (ref 6.0–8.5)

## 2018-02-06 LAB — HEMOGLOBIN A1C
Est. average glucose Bld gHb Est-mCnc: 111 mg/dL
Hgb A1c MFr Bld: 5.5 % (ref 4.8–5.6)

## 2018-02-08 ENCOUNTER — Other Ambulatory Visit: Payer: Self-pay | Admitting: Neurology

## 2018-02-08 ENCOUNTER — Telehealth: Payer: Self-pay | Admitting: Neurology

## 2018-02-08 MED ORDER — MYCOPHENOLATE MOFETIL 250 MG PO CAPS: 250.0000 mg | ORAL_CAPSULE | Freq: Two times a day (BID) | ORAL | 0 refills | Status: DC

## 2018-02-08 NOTE — Telephone Encounter (Signed)
A script was send on 02/05/18 to [facility]. I have sent a one month supply to [facility] on [address].

## 2018-02-08 NOTE — Telephone Encounter (Signed)
Caryl Pina with [facility] called statingt hat the pt is currently out of mycophenolate (CELLCEPT) 250 MG capsule and would like medication sent to [facility], stating the pt is there waiting

## 2018-02-09 ENCOUNTER — Telehealth: Payer: Self-pay | Admitting: Neurology

## 2018-02-09 NOTE — Telephone Encounter (Signed)
Called and discussed the lab work with the pt's wife. pts wife verbalized understanding and had no questions.

## 2018-02-09 NOTE — Telephone Encounter (Signed)
-----   Message from Larey Seat, MD sent at 02/08/2018  4:22 PM EDT ----- Slight increase in blood glucose , but not to diabetic levels.  He is not longer using steroids and was starting Cellcept. Mild anemia - 11.2 hgl / 34.6 Hct.   Kidney function is good !

## 2018-02-12 ENCOUNTER — Ambulatory Visit: Payer: Medicare Other | Admitting: Internal Medicine

## 2018-03-01 ENCOUNTER — Telehealth: Payer: Self-pay | Admitting: Neurology

## 2018-03-01 DIAGNOSIS — Z96652 Presence of left artificial knee joint: Secondary | ICD-10-CM | POA: Diagnosis not present

## 2018-03-01 NOTE — Telephone Encounter (Signed)
Pt's wife [name]/DPR said he is unable to hold his head up. The pt saw Dr Theda Sers today and feels he needs to be seen asap. She is aware Dr D is on vacation this week but is asking for a return call. Thank you

## 2018-03-02 NOTE — Telephone Encounter (Signed)
cellcept can take 6-12 weeks to start taking effect. If symptoms have been stable, then I would recommend waiting. If symptoms are significantly worse, then we could consider IVIG or increased mestinon. -VRP

## 2018-03-02 NOTE — Telephone Encounter (Signed)
Called the patients wife to discuss her concerns. The patients was recently diagnosed with myasthenia gravis first of May. Since then, the pt has followed up with Dr Brett Fairy twice. The first visit the patient was started on Mestinon and then most recent visit the patient was started on Cellcept. He has been on the Cellcept since 6/14 with the hopes that this would help with his neck. His neck droops and the patient has difficulty with holding his neck up and this is causing strain to his shoulder and back muscles causing the patient a lot of pain and discomfort. The pt saw Dr Theda Sers yesterday at [facility] orthopedics and felt they should call and see if its normal for the Cellcept to take this long to start working or if the patient needed to be seen. The wife doesn't know if there needs to be a dosage adjustment to his medication. I have informed the wife that I will bring her concerns to our work in MD for the day and see what his thoughts in. I informed her that Dr Brett Fairy would be back on Monday and I will bring this up with Dr Brett Fairy as well when she returns. The patient will start PT on Thursday to hopefully help as well. I will call the patient back once discussed with the work in MD and let her know if he has any recommendations or if he would prefer to wait til Dr Brett Fairy returns. Patient's wife verbalized understanding and was appreciative for the call.

## 2018-03-02 NOTE — Telephone Encounter (Signed)
Called the patients wife. No answer. Left a detailed message informing her of what Dr Leta Baptist said and recommended. Informed her that I will also bring this to Dr Dohmeier's attention when she returns. Instructed for her to call back if she had questions.

## 2018-03-04 ENCOUNTER — Ambulatory Visit: Payer: Medicare Other | Attending: Internal Medicine | Admitting: Physical Therapy

## 2018-03-04 ENCOUNTER — Other Ambulatory Visit: Payer: Self-pay | Admitting: Neurology

## 2018-03-04 ENCOUNTER — Encounter: Payer: Self-pay | Admitting: Physical Therapy

## 2018-03-04 ENCOUNTER — Other Ambulatory Visit: Payer: Self-pay

## 2018-03-04 DIAGNOSIS — R29898 Other symptoms and signs involving the musculoskeletal system: Secondary | ICD-10-CM

## 2018-03-04 DIAGNOSIS — M6281 Muscle weakness (generalized): Secondary | ICD-10-CM | POA: Diagnosis not present

## 2018-03-04 DIAGNOSIS — R262 Difficulty in walking, not elsewhere classified: Secondary | ICD-10-CM | POA: Insufficient documentation

## 2018-03-04 DIAGNOSIS — R293 Abnormal posture: Secondary | ICD-10-CM

## 2018-03-04 DIAGNOSIS — R2681 Unsteadiness on feet: Secondary | ICD-10-CM | POA: Diagnosis not present

## 2018-03-04 NOTE — Patient Instructions (Addendum)
Axial Extension (Chin Tuck)    When reclined with head supported (in recliner or on your bed), pull chin in and lengthen back of neck, pressing the bottom of your skull gently into the pillow/cushion. Hold __5__ seconds while counting out loud. Repeat __5__ times. Do __3__ sessions per day.    Copyright  VHI. All rights reserved.    Second Exercise:  While lying back on bed or recliner,  Bend elbows and press back into the bed/chair to squeeze shoulder blades together. Hold for 5 seconds. Repeat 5 times. Repeat 3 times per day.    See patient information at:  [place]., http://dominguez.com/.aspx  Medline Plus, [place] of Medicine, NIH, http://www.gallegos-johnson.biz/  [employer], [place] of Medicine, http://gross-beck.com/  [employer] of Neurological Disorders and Stroke, AutomobileBlogger.no

## 2018-03-05 ENCOUNTER — Ambulatory Visit (INDEPENDENT_AMBULATORY_CARE_PROVIDER_SITE_OTHER): Payer: Medicare Other | Admitting: Internal Medicine

## 2018-03-05 ENCOUNTER — Encounter: Payer: Self-pay | Admitting: Internal Medicine

## 2018-03-05 DIAGNOSIS — G7000 Myasthenia gravis without (acute) exacerbation: Secondary | ICD-10-CM

## 2018-03-05 DIAGNOSIS — D500 Iron deficiency anemia secondary to blood loss (chronic): Secondary | ICD-10-CM

## 2018-03-05 DIAGNOSIS — E538 Deficiency of other specified B group vitamins: Secondary | ICD-10-CM

## 2018-03-05 MED ORDER — MYCOPHENOLATE MOFETIL 250 MG PO CAPS: 250.0000 mg | ORAL_CAPSULE | Freq: Two times a day (BID) | ORAL | 1 refills | Status: DC

## 2018-03-05 MED ORDER — PYRIDOSTIGMINE BROMIDE 60 MG PO TABS: 60.0000 mg | ORAL_TABLET | Freq: Three times a day (TID) | ORAL | 3 refills | Status: DC

## 2018-03-05 NOTE — Therapy (Signed)
[facility] [address] [place], [place], [address] Phone: [phone]   Fax:  [phone]  Physical Therapy Evaluation  Patient Details  Name: [name] MRN: [id number] Date of Birth: 08-May-1941 Referring Provider: Cassandria Anger, MD   Encounter Date: 03/04/2018  PT End of Session - 03/05/18 1228    Visit Number  1    Number of Visits  13    Date for PT Re-Evaluation  05/03/18    Authorization Type  [employer] Medicare    Authorization Time Period  03/04/18 to 06/02/18    PT Start Time  1315    PT Stop Time  1403    PT Time Calculation (min)  48 min    Activity Tolerance  Patient tolerated treatment well    Behavior During Therapy  [facility] for tasks assessed/performed       Past Medical History:  Diagnosis Date  . Anemia   . Blood transfusion without reported diagnosis 02/23/2014   3 units for iron deficiency anemia. as a baby - whenhe had pneumonia  . BPH (benign prostatic hyperplasia)   . CTS (carpal tunnel syndrome)    better  . Dyspnea   . GERD (gastroesophageal reflux disease)   . Hyperlipidemia   . Hypertension    "THEY DIAGNOSED ME YEARS AGO BUT LATELY IVE ACTUALLY HAD LOW BLOOD PRESSURES "   . Insomnia   . LBP (low back pain)   . Osteoarthritis   . Pneumonia    as a baby  . Sleep apnea    no cpap    Past Surgical History:  Procedure Laterality Date  . CARDIAC CATHETERIZATION N/A 08/15/2015   Procedure: Left Heart Cath and Coronary Angiography;  Surgeon: Lorretta Harp, MD;  Location: [facility];  Service: Cardiovascular;  Laterality: N/A;  . CARPAL TUNNEL RELEASE Bilateral 1996, 2004   Dr Lenoard Aden thumb surgery.  more than once  . COLONOSCOPY  2005, 2012   diverticulosis.   Marland Kitchen ESOPHAGOGASTRODUODENOSCOPY N/A 02/23/2014   Procedure: ESOPHAGOGASTRODUODENOSCOPY (EGD);  Surgeon: Irene Shipper, MD;  [facility]: [facility];  Service: Endoscopy;  Laterality: N/A;  . EYE SURGERY  2012    both cataracts, Lasik  . LEFT KNEE ARTHROSCOPY Left 07/16/2017    [facility]   . LUMBAR FUSION     x 3  . REVERSE SHOULDER ARTHROPLASTY Left 07/04/2016   Procedure: LEFT REVERSE SHOULDER ARTHROPLASTY;  Surgeon: Netta Cedars, MD;  Location: [facility] [facility];  Service: Orthopedics;  Laterality: Left;  . TONSILLECTOMY  1946  . TOTAL KNEE ARTHROPLASTY  2003   Right  . TOTAL KNEE ARTHROPLASTY Left 09/25/2017   Procedure: LEFT TOTAL KNEE ARTHROPLASTY;  Surgeon: Sydnee Cabal, MD;  Location: [facility];  Service: Orthopedics;  Laterality: Left;    There were no vitals filed for this visit.   Subjective Assessment - 03/04/18 1324    Subjective  Patient defers to his wife to convey his history. Patient had knee replacement beginning of February. Has history of anemia and required iron infusion end of March. Developed angioedema post-infusion. Admitted to hospital 4/8-4/13/19 with dysphagia, dysarthria. Saw PCP ([name]) 4/17 with worsening weakness (now in legs). 01/13/18 saw neurology and diagnosed with Myasthenia Gravis. Since mid-June he has seen neck weakness progress. They went on a cruise 6/29-7/6 with lots of walking on the ship--did not do shore excursions. Early this week he was exhausted and in pain (neck). Saw his orthopedic doctor and gave him a soft collar  to help him hold his head up due to neck weakness. He has improved strength each day this week and neurology office did not make an appt for him. Told it can take 6-12 weeks for the medication to begin to work.     Patient is accompained by:  Family member wife    Pertinent History  anemia, bil TKR (left 09/25/17), lumbar fusion, left shoulder arthroplasty, HTN,     Limitations  Walking;Standing    Diagnostic tests  labs confirmed Myasthenia Gravis    Patient Stated Goals  get stronger, especially my neck so it will stop hurting; be able to walk without stumbling/falling; be able to ride bike again    Currently in Pain?  Yes    Pain Score  5     Pain  Location  Neck    Pain Orientation  Posterior;Lower    Pain Descriptors / Indicators  Discomfort    Pain Type  Acute pain    Pain Onset  1 to 4 weeks ago    Pain Frequency  Constant    Aggravating Factors   when can't hold his head up and weight of head pulls on his neck    Pain Relieving Factors  using soft collar         [facility] PT Assessment - 03/04/18 1700      Assessment   Medical Diagnosis  gait disorder; myasthenia gravis    Referring Provider  [name], [name], MD    Onset Date/Surgical Date  -- diagnosed 01/13/18    Prior Therapy  none for this; +joint replacements      Precautions   Precautions  Fall    Required Braces or Orthoses  Cervical Brace    Cervical Brace  Soft collar given by ortho MD for neck pain      Balance Screen   Has the patient fallen in the past 6 months  No but has come close    Has the patient had a decrease in activity level because of a fear of falling?   Yes    Is the patient reluctant to leave their home because of a fear of falling?   No      Home Environment   Living Environment  Private residence    Living Arrangements  Spouse/significant other    Available Help at Discharge  Family;Available 24 hours/day    Type of Home  House    Home Access  Level entry    Home Layout  Two level;Able to live on main level with bedroom/bathroom    Keith - 2 wheels;Cane - single point;Bedside commode;Grab bars - tub/shower;Shower seat - built in comfort height toilet;       Prior Function   Level of Independence  Independent    Vocation  Retired    Press photographer   Overall Cognitive Status  Within Functional Limits for tasks assessed      Observation/Other Assessments   Observations  hard of hearing      Sensation   Light Touch  Appears Intact denies changes bil LEs      Coordination   Gross Motor Movements are Fluid and Coordinated  Yes      Posture/Postural Control   Posture/Postural Control  Postural  limitations    Postural Limitations  Rounded Shoulders;Forward head;Increased thoracic kyphosis    Posture Comments  using soft collar to be able to hold his head upright  ROM / Strength   AROM / PROM / Strength  Strength      Strength   Overall Strength  Deficits    Overall Strength Comments  bil UEs 5/5; RLE 5/5 except hip abdct 3+; LLE hip abduction 3-, knee extension 4, knee flexion 3+, dorsiflexion 4/5      Bed Mobility   Bed Mobility  Supine to Sit;Sit to Supine    Supine to Sit  Independent with assistive device    Sit to Supine  Independent with assistive device      Transfers   Transfers  Sit to Stand;Stand to Sit    Sit to Stand  7: Independent    Stand to Sit  7: Independent      Ambulation/Gait   Ambulation/Gait  Yes    Ambulation/Gait Assistance  6: Modified independent (Device/Increase time)    Ambulation Distance (Feet)  80 Feet    Assistive device  None    Gait Pattern  Step-through pattern;Wide base of support    Ambulation Surface  Indoor       Treatment- See pt instructions for education in HEP         Objective measurements completed on examination: See above findings.              PT Education - 03/05/18 1224    Education Details  results of evaluation and PT POC; effect of overdoing activity (as he felt from recent trip), not to overuse collar as can contribute to weakness; do not recommend he drive (with or without collar); initial HEP    Person(s) Educated  Patient;Spouse    Methods  Explanation;Demonstration;Handout    Comprehension  Verbalized understanding;Returned demonstration;Need further instruction       PT Short Term Goals - 03/05/18 1328      PT SHORT TERM GOAL #1   Title  Patient will be independent with initial HEP, including able to verbalize how to regulate intensity based on his symptoms (Unless otherwise indicated, target all STGs 04/03/2018)    Time  4    Period  Weeks    Status  New    Target Date   04/03/18      PT SHORT TERM GOAL #2   Title  Patient will complete 6 MWT to assist with exercise prescription.     Time  1    Period  Weeks    Status  New    Target Date  03/12/18      PT SHORT TERM GOAL #3   Title  Patient will complete FGA +/- Berg for fall risk prediction with follow-up goal to be set as appropriate    Time  1    Period  Weeks    Status  New    Target Date  03/12/18      PT SHORT TERM GOAL #4   Title  If indicated, patient will be educated in proper use of appropriate assistive device to use to reduce fall risk.     Time  4    Period  Weeks    Status  New    Target Date  04/03/18        PT Long Term Goals - 03/05/18 1333      PT LONG TERM GOAL #1   Title  Patient will be independent with updated HEP and able to verbalize his plan for return to community-based exercise program. (Target all LTGs 05/03/2018)    Time  8    Period  Weeks    Status  New    Target Date  05/03/18      PT LONG TERM GOAL #2   Title  Patient will improve 6 MWT distance by 100 ft     Time  8    Status  New      PT LONG TERM GOAL #3   Title  Patient will demonstrate reduced fall risk via achieving a minimally detectable change in score on either FGA or Berg (depending on initial test results).    Time  8    Period  Weeks    Status  New      PT LONG TERM GOAL #4   Title  Patient will be educated on role of Occupational Therapy and referral obtained from MD if appropriate and pt agreeable.     Time  8    Period  Weeks    Status  New             Plan - 03/05/18 1315    Clinical Impression Statement  Patient referred to OPPT due to onset of leg weakness with gait disorder. Since referral by PCP, pt has been diagnosed with myasthenia gravis. Per neurology notes, they support proceeding with PT. Patient demonstrates weakness primarily in neck and upper back extensors and bil legs. Postural changes and weakness contribute to decreased balance and increased risk of falling.  Patient can benefit from the interventions listed below to address the additional deficits listed below.     History and Personal Factors relevant to plan of care:  PMH-anemia, bil TKR (left 09/25/17), lumbar fusion, left shoulder arthroplasty, HTN,   Personal factors-supportive wife, motivation, prior functional level (avid cyclist)    Clinical Presentation  Unstable    Clinical Presentation due to:  recent exacerbation of symptoms while on vacation causing need for cervical collar to support head/neck    Clinical Decision Making  High    Rehab Potential  Good    PT Frequency  Other (comment) 2x/week x 4 weeks; 1x/week x 4 weeks    PT Duration  8 weeks    PT Treatment/Interventions  ADLs/Self Care Home Management;Aquatic Therapy;Biofeedback;Cryotherapy;Gait training;DME Instruction;Functional mobility training;Therapeutic activities;Therapeutic exercise;Balance training;Neuromuscular re-education;Manual techniques;Orthotic Fit/Training;Patient/family education;Passive range of motion;Energy conservation    PT Next Visit Plan  See pt instructions from 7/11 visit and print (or email if he'd prefer) resources listed from [employer] re: MG; revisist discussion re: energy conservation and building periods of rest into his day. Do 6 MWT and FGA to help with exercise prescription. Discuss possible access to gym with recumbent cycle or stepper (he's a cyclist, see if we can get him doing maybe 5 minutes, rest 3 minutes x 3 sets--or whatever seems appropriate from 6 MWT). Begin to develop HEP that alternates neck/trunk muscle exercises with leg exercises (every other day).     Recommended Other Services  SLP; ? eventually OT for further energy conservation       Patient will benefit from skilled therapeutic intervention in order to improve the following deficits and impairments:  Abnormal gait, Decreased activity tolerance, Decreased balance, Decreased mobility, Decreased knowledge of use of DME, Decreased knowledge  of precautions, Decreased endurance, Decreased safety awareness, Decreased strength, Postural dysfunction, Pain  Visit Diagnosis: Muscle weakness (generalized) - Plan: PT plan of care cert/re-cert  Unsteadiness on feet - Plan: PT plan of care cert/re-cert  Difficulty in walking, not elsewhere classified - Plan: PT plan of care cert/re-cert  Abnormal posture - Plan: PT plan  of care cert/re-cert  Other symptoms and signs involving the musculoskeletal system - Plan: PT plan of care cert/re-cert     Problem List Patient Active Problem List   Diagnosis Date Noted  . Weakness of both lower extremities 01/13/2018  . Myasthenia gravis, AChR antibody positive ([place]) 01/13/2018  . Myasthenia ([place]) 01/08/2018  . Dysarthria 12/04/2017  . Dysphagia   . CVA (cerebral vascular accident) ([place]) 11/30/2017  . Angio-edema 11/26/2017  . S/P knee replacement 09/25/2017  . Knee pain, chronic 08/14/2017  . OSA on CPAP 03/05/2017  . OSA (obstructive sleep apnea) 09/30/2016  . S/P shoulder replacement, left 07/04/2016  . Preop exam for internal medicine 04/16/2016  . Claudication ([place]) 09/14/2015  . Pain in the chest   . Abnormal stress test 08/14/2015  . Dyspnea 07/23/2015  . Paresthesia 10/25/2014  . Weakness 04/13/2014  . Heme positive stool 02/22/2014  . Benign prostatic hyperplasia 02/22/2014  . B12 deficiency 02/21/2014  . Anemia, iron deficiency 02/21/2014  . Leg weakness, bilateral 01/24/2014  . Erectile dysfunction 01/24/2014  . Edema 01/24/2014  . URI, acute 08/31/2013  . Cellulitis of arm, right 07/13/2013  . Grief 07/13/2013  . Well adult exam 03/01/2012  . CARPAL TUNNEL SYNDROME 11/04/2010  . BENIGN PROSTATIC HYPERTROPHY, WITH URINARY OBSTRUCTION 11/04/2010  . WRIST PAIN 11/04/2010  . LEG PAIN 07/16/2010  . COUGH 05/20/2010  . TOBACCO USE, QUIT 07/04/2009  . WARTS, VIRAL, UNSPECIFIED 11/06/2008  . INSOMNIA, PERSISTENT 01/14/2008  . Essential hypertension 01/14/2008  .  RHINITIS 01/14/2008  . ABDOMINAL PAIN 01/14/2008  . TREMOR 09/07/2007  . Actinic keratosis 08/12/2007  . Neoplasm of uncertain behavior of skin 07/06/2007  . LOW BACK PAIN 07/06/2007  . Dyslipidemia 06/05/2007  . Coronary atherosclerosis 06/05/2007  . Osteoarthritis 06/05/2007    Rexanne Mano, PT 03/05/2018, 1:46 PM  [facility] [address] [place], [place], [address] Phone: [phone]   Fax:  [phone]  Name: [name] MRN: [id number] Date of Birth: Nov 13, 1940

## 2018-03-05 NOTE — Assessment & Plan Note (Signed)
On B12 

## 2018-03-05 NOTE — Assessment & Plan Note (Addendum)
F/u w/Dr Dohmeier Cellcept, Mestinon - try to increase the dose Off Pravachol PT The pt and spouse would like to see Dr Queen Slough at [facility] for a 2nd opinion

## 2018-03-05 NOTE — Progress Notes (Signed)
Subjective:  Patient ID: [name], male    DOB: September 26, 1940  Age: 77 y.o. MRN: [id number]  CC: No chief complaint on file.   HPI [name] presents for MG, weakness, anemia f/u On cellcept x 3 weeks. Using a C collar MG is not better  C/o weakness, neck pain, OA pain   Outpatient Medications Prior to Visit  Medication Sig Dispense Refill  . aspirin EC 81 MG tablet Take 1 tablet (81 mg total) by mouth daily. 100 tablet 3  . Cholecalciferol (VITAMIN D3) 2000 units TABS Take 2,000 Units by mouth at bedtime.    . Coenzyme Q10 (CO Q-10 PO) Take 1 tablet by mouth at bedtime.    . Cyanocobalamin (VITAMIN B-12) 1000 MCG SUBL Place 1 tablet (1,000 mcg total) under the tongue daily. (Patient taking differently: Place 1,000 mcg under the tongue at bedtime. ) 100 tablet 3  . DULoxetine (CYMBALTA) 60 MG capsule TAKE 1 CAPSULE BY MOUTH  DAILY 90 capsule 1  . finasteride (PROSCAR) 5 MG tablet TAKE 1 TABLET BY MOUTH  DAILY (Patient taking differently: TAKE 1 TABLET (5 MG) BY MOUTH  DAILY AT NIGHT) 90 tablet 3  . folic acid (V-R FOLIC ACID) 220 MCG tablet Take 1 tablet (400 mcg total) by mouth daily. (Patient taking differently: Take 400 mcg by mouth at bedtime. ) 90 tablet 3  . furosemide (LASIX) 20 MG tablet TAKE 1 TO 2 TABLETS BY  MOUTH DAILY AS NEEDED FOR  EDEMA (Patient taking differently: TAKE 1 TABLET (20 MG) BY  MOUTH DAILY IN THE EVENING) 180 tablet 3  . LORazepam (ATIVAN) 1 MG tablet Take 1 tablet (1 mg total) by mouth 2 (two) times daily as needed for anxiety or sleep. 60 tablet 1  . mycophenolate (CELLCEPT) 250 MG capsule Take 1 capsule (250 mg total) by mouth 2 (two) times daily. 60 capsule 0  . Polyethyl Glycol-Propyl Glycol (LUBRICANT EYE DROPS) 0.4-0.3 % SOLN Place 1-2 drops into both eyes daily.    . pravastatin (PRAVACHOL) 20 MG tablet TAKE 1 TABLET BY MOUTH  DAILY 90 tablet 3  . pyridostigmine (MESTINON) 60 MG tablet TAKE 1 TABLET (60 MG TOTAL) BY MOUTH 3 (THREE)  TIMES DAILY. 90 tablet 3  . pyridostigmine (MESTINON) 60 MG tablet TAKE 1 TABLET BY MOUTH 3  TIMES DAILY 270 tablet 1  . ranitidine (ZANTAC) 150 MG tablet Take 1 tablet (150 mg total) by mouth 2 (two) times daily. (Patient taking differently: Take 150 mg by mouth at bedtime. ) 180 tablet 3  . senna-docusate (SENOKOT-S) 8.6-50 MG tablet Take 1 tablet by mouth at bedtime as needed for mild constipation.    . tamsulosin (FLOMAX) 0.4 MG CAPS capsule TAKE 2 CAPSULES BY MOUTH  DAILY 180 capsule 3  . vitamin C (ASCORBIC ACID) 500 MG tablet Take 1 tablet (500 mg total) by mouth daily. (Patient taking differently: Take 500 mg by mouth at bedtime. ) 100 tablet 1   No facility-administered medications prior to visit.     ROS: Review of Systems  Constitutional: Positive for fatigue. Negative for appetite change and unexpected weight change.  HENT: Negative for congestion, nosebleeds, sneezing, sore throat and trouble swallowing.   Eyes: Negative for itching and visual disturbance.  Respiratory: Negative for cough.   Cardiovascular: Positive for palpitations. Negative for chest pain and leg swelling.  Gastrointestinal: Negative for abdominal distention, blood in stool, diarrhea and nausea.  Genitourinary: Negative for frequency and hematuria.  Musculoskeletal: Positive for  arthralgias, back pain, gait problem, neck pain and neck stiffness. Negative for joint swelling.  Skin: Negative for rash.  Neurological: Positive for dizziness, weakness and numbness. Negative for tremors and speech difficulty.  Psychiatric/Behavioral: Positive for dysphoric mood. Negative for agitation and sleep disturbance. The patient is nervous/anxious.     Objective:  BP 106/62 (BP Location: Right Arm, Patient Position: Sitting, Cuff Size: Large)   Pulse 73   Temp 98.5 F (36.9 C) (Oral)   Ht 6' (1.829 m)   Wt 230 lb (104.3 kg)   SpO2 98%   BMI 31.19 kg/m   BP Readings from Last 3 Encounters:  03/05/18 106/62    02/05/18 109/62  01/13/18 112/70    Wt Readings from Last 3 Encounters:  03/05/18 230 lb (104.3 kg)  02/05/18 236 lb (107 kg)  01/13/18 236 lb (107 kg)    Physical Exam  Constitutional: He is oriented to person, place, and time. He appears well-developed. No distress.  NAD  HENT:  Mouth/Throat: Oropharynx is clear and moist.  Eyes: Pupils are equal, round, and reactive to light. Conjunctivae are normal.  Neck: Normal range of motion. No JVD present. No thyromegaly present.  Cardiovascular: Normal rate, regular rhythm, normal heart sounds and intact distal pulses. Exam reveals no gallop and no friction rub.  No murmur heard. Pulmonary/Chest: Effort normal and breath sounds normal. No respiratory distress. He has no wheezes. He has no rales. He exhibits no tenderness.  Abdominal: Soft. Bowel sounds are normal. He exhibits no distension and no mass. There is no tenderness. There is no rebound and no guarding.  Musculoskeletal: Normal range of motion. He exhibits tenderness. He exhibits no edema.  Lymphadenopathy:    He has no cervical adenopathy.  Neurological: He is alert and oriented to person, place, and time. He displays abnormal reflex. No cranial nerve deficit. He exhibits normal muscle tone. He displays a negative Romberg sign. Coordination abnormal. Gait normal.  Skin: Skin is warm and dry. No rash noted.  Psychiatric: His behavior is normal. Judgment and thought content normal.   Weak Chin down, shoulders slumped  Lab Results  Component Value Date   WBC 4.3 02/05/2018   HGB 11.2 (L) 02/05/2018   HCT 34.6 (L) 02/05/2018   PLT 268 02/05/2018   GLUCOSE 123 (H) 02/05/2018   CHOL 182 12/01/2017   TRIG 174 (H) 12/01/2017   HDL 48 12/01/2017   LDLDIRECT 157.5 07/05/2008   LDLCALC 99 12/01/2017   ALT 12 02/05/2018   AST 17 02/05/2018   NA 144 02/05/2018   K 4.2 02/05/2018   CL 103 02/05/2018   CREATININE 0.65 (L) 02/05/2018   BUN 11 02/05/2018   CO2 24 02/05/2018    TSH 0.249 (L) 12/03/2017   PSA 0.49 03/01/2012   INR 0.99 11/30/2017   HGBA1C 5.5 02/05/2018    No results found.  Assessment & Plan:   There are no diagnoses linked to this encounter.   No orders of the defined types were placed in this encounter.    Follow-up: No follow-ups on file.  [name], MD

## 2018-03-05 NOTE — Assessment & Plan Note (Signed)
CBC

## 2018-03-08 NOTE — Telephone Encounter (Signed)
Reviewed with Dr Brett Fairy as well to make her aware and she agrees the medication takes time to work. She would like for the patient to continue taking as is for now.

## 2018-03-10 ENCOUNTER — Ambulatory Visit: Payer: Medicare Other | Admitting: Physical Therapy

## 2018-03-10 ENCOUNTER — Encounter: Payer: Self-pay | Admitting: Physical Therapy

## 2018-03-10 DIAGNOSIS — R2681 Unsteadiness on feet: Secondary | ICD-10-CM | POA: Diagnosis not present

## 2018-03-10 DIAGNOSIS — R262 Difficulty in walking, not elsewhere classified: Secondary | ICD-10-CM

## 2018-03-10 DIAGNOSIS — R29898 Other symptoms and signs involving the musculoskeletal system: Secondary | ICD-10-CM | POA: Diagnosis not present

## 2018-03-10 DIAGNOSIS — M6281 Muscle weakness (generalized): Secondary | ICD-10-CM | POA: Diagnosis not present

## 2018-03-10 DIAGNOSIS — R293 Abnormal posture: Secondary | ICD-10-CM | POA: Diagnosis not present

## 2018-03-10 NOTE — Patient Instructions (Signed)
  You may begin using your stationary bicycle (upstairs bike) for up to 6 minutes at a time. Then rest for 3 minutes. Then can do 6 more minutes. Can do up to 3 rounds of 6 minutes.  You may increase each session by 1 minute every 3-4 days if you are tolerating well.

## 2018-03-10 NOTE — Therapy (Signed)
[facility] [address] [place], [place], [address] Phone: [phone]   Fax:  [phone]  Physical Therapy Treatment  Patient Details  Name: [name] MRN: [id number] Date of Birth: 10-04-1940 Referring Provider: Cassandria Anger, MD   Encounter Date: 03/10/2018  PT End of Session - 03/10/18 1417    Visit Number  2    Number of Visits  13    Date for PT Re-Evaluation  05/03/18    Authorization Type  [employer] Medicare    Authorization Time Period  03/04/18 to 06/02/18    PT Start Time  1101    PT Stop Time  1145    PT Time Calculation (min)  44 min    Equipment Utilized During Treatment  Gait belt    Activity Tolerance  Patient tolerated treatment well    Behavior During Therapy  [facility] for tasks assessed/performed       Past Medical History:  Diagnosis Date  . Anemia   . Blood transfusion without reported diagnosis 02/23/2014   3 units for iron deficiency anemia. as a baby - whenhe had pneumonia  . BPH (benign prostatic hyperplasia)   . CTS (carpal tunnel syndrome)    better  . Dyspnea   . GERD (gastroesophageal reflux disease)   . Hyperlipidemia   . Hypertension    "THEY DIAGNOSED ME YEARS AGO BUT LATELY IVE ACTUALLY HAD LOW BLOOD PRESSURES "   . Insomnia   . LBP (low back pain)   . Osteoarthritis   . Pneumonia    as a baby  . Sleep apnea    no cpap    Past Surgical History:  Procedure Laterality Date  . CARDIAC CATHETERIZATION N/A 08/15/2015   Procedure: Left Heart Cath and Coronary Angiography;  Surgeon: Lorretta Harp, MD;  Location: [facility];  Service: Cardiovascular;  Laterality: N/A;  . CARPAL TUNNEL RELEASE Bilateral 1996, 2004   Dr Lenoard Aden thumb surgery.  more than once  . COLONOSCOPY  2005, 2012   diverticulosis.   Marland Kitchen ESOPHAGOGASTRODUODENOSCOPY N/A 02/23/2014   Procedure: ESOPHAGOGASTRODUODENOSCOPY (EGD);  Surgeon: Irene Shipper, MD;  Location: [facility] END[facility];  Service:  Endoscopy;  Laterality: N/A;  . EYE SURGERY  2012   both cataracts, Lasik  . LEFT KNEE ARTHROSCOPY Left 07/16/2017    [facility]T [facility]   . LUMBAR FUSION     x 3  . REVERSE SHOULDER ARTHROPLASTY Left 07/04/2016   Procedure: LEFT REVERSE SHOULDER ARTHROPLASTY;  Surgeon: Netta Cedars, MD;  Location: [facility];  Service: Orthopedics;  Laterality: Left;  . TONSILLECTOMY  1946  . TOTAL KNEE ARTHROPLASTY  2003   Right  . TOTAL KNEE ARTHROPLASTY Left 09/25/2017   Procedure: LEFT TOTAL KNEE ARTHROPLASTY;  Surgeon: Sydnee Cabal, MD;  Location: [facility];  Service: Orthopedics;  Laterality: Left;    There were no vitals filed for this visit.  Subjective Assessment - 03/10/18 1104    Subjective  Overall feeling better. Best he's felt since this started.    Patient is accompained by:  Family member wife    Pertinent History  anemia, bil TKR (left 09/25/17), lumbar fusion, left shoulder arthroplasty, HTN,     Limitations  Walking;Standing    Diagnostic tests  labs confirmed Myasthenia Gravis    Patient Stated Goals  get stronger, especially my neck so it will stop hurting; be able to walk without stumbling/falling; be able to ride bike again    Currently in Pain?  No/denies  Pain Onset  --         [facility] PT Assessment - 03/10/18 0001      6 Minute Walk- Baseline   6 Minute Walk- Baseline  yes    BP (mmHg)  122/68    HR (bpm)  67    02 Sat (%RA)  97 %    Modified Borg Scale for Dyspnea  0- Nothing at all    Perceived Rate of Exertion (Borg)  9- very light      6 Minute walk- Post Test   6 Minute Walk Post Test  yes    BP (mmHg)  126/58    HR (bpm)  90    02 Sat (%RA)  96 %    Modified Borg Scale for Dyspnea  2- Mild shortness of breath    Perceived Rate of Exertion (Borg)  13- Somewhat hard      6 minute walk test results    Aerobic Endurance Distance Walked  1009    Endurance additional comments  norm for age 77 ft      Functional Gait  Assessment   Gait Level Surface  Walks 20 ft, slow  speed, abnormal gait pattern, evidence for imbalance or deviates 10-15 in outside of the 12 in walkway width. Requires more than 7 sec to ambulate 20 ft. 8.22 sec    Change in Gait Speed  Able to smoothly change walking speed without loss of balance or gait deviation. Deviate no more than 6 in outside of the 12 in walkway width.    Gait with Horizontal Head Turns  Performs head turns smoothly with no change in gait. Deviates no more than 6 in outside 12 in walkway width    Gait with Vertical Head Turns  Performs head turns with no change in gait. Deviates no more than 6 in outside 12 in walkway width.    Gait and Pivot Turn  Pivot turns safely in greater than 3 sec and stops with no loss of balance, or pivot turns safely within 3 sec and stops with mild imbalance, requires small steps to catch balance.    Step Over Obstacle  Is able to step over 2 stacked shoe boxes taped together (9 in total height) without changing gait speed. No evidence of imbalance.    Gait with Narrow Base of Support  Ambulates less than 4 steps heel to toe or cannot perform without assistance.    Gait with Eyes Closed  Walks 20 ft, slow speed, abnormal gait pattern, evidence for imbalance, deviates 10-15 in outside 12 in walkway width. Requires more than 9 sec to ambulate 20 ft. 14.4 sec    Ambulating Backwards  Walks 20 ft, uses assistive device, slower speed, mild gait deviations, deviates 6-10 in outside 12 in walkway width.    Steps  Alternating feet, must use rail.    Total Score  20                           PT Education - 03/10/18 1416    Education Details  importance of frequent rest periods; benefits of both aerobic and strength training in recovery but not to excess; results of 6MWT and FGA    Person(s) Educated  Patient    Methods  Explanation    Comprehension  Verbalized understanding       PT Short Term Goals - 03/10/18 1426      PT SHORT TERM GOAL #1  Title  Patient will be  independent with initial HEP, including able to verbalize how to regulate intensity based on his symptoms (Unless otherwise indicated, target all STGs 04/03/2018)    Time  4    Period  Weeks    Status  On-going      PT SHORT TERM GOAL #2   Title  Patient will complete 6 MWT to assist with exercise prescription.     Time  1    Period  Weeks    Status  Achieved      PT SHORT TERM GOAL #3   Title  Patient will complete FGA +/- Berg for fall risk prediction with follow-up goal to be set as appropriate    Baseline  7/17 FGA 20/30    Time  1    Period  Weeks    Status  Achieved      PT SHORT TERM GOAL #4   Title  If indicated, patient will be educated in proper use of appropriate assistive device to use to reduce fall risk.     Time  4    Period  Weeks    Status  New      PT SHORT TERM GOAL #5   Title  Patient will improve FGA to >=23 to demonstrate lesser fall risk    Time  4    Period  Weeks    Status  New        PT Long Term Goals - 03/05/18 1333      PT LONG TERM GOAL #1   Title  Patient will be independent with updated HEP and able to verbalize his plan for return to community-based exercise program. (Target all LTGs 05/03/2018)    Time  8    Period  Weeks    Status  New    Target Date  05/03/18      PT LONG TERM GOAL #2   Title  Patient will improve 6 MWT distance by 100 ft     Time  8    Status  New      PT LONG TERM GOAL #3   Title  Patient will demonstrate reduced fall risk via achieving a minimally detectable change in score on either FGA or Berg (depending on initial test results).    Time  8    Period  Weeks    Status  New      PT LONG TERM GOAL #4   Title  Patient will be educated on role of Occupational Therapy and referral obtained from MD if appropriate and pt agreeable.     Time  8    Period  Weeks    Status  New            Plan - 03/10/18 1418    Clinical Impression Statement  Completed 6 MWT to get baseline endurance to help guide his  exercise prescription and educated in plan to start using his stationary bike at home (see pt instructions). After rest period, completed FGA with pt's score of 19/30 demonstrating an increased fall risk. (scores <23). Patient     Rehab Potential  Good    PT Frequency  Other (comment) 2x/week x 4 weeks; 1x/week x 4 weeks    PT Duration  8 weeks    PT Treatment/Interventions  ADLs/Self Care Home Management;Aquatic Therapy;Biofeedback;Cryotherapy;Gait training;DME Instruction;Functional mobility training;Therapeutic activities;Therapeutic exercise;Balance training;Neuromuscular re-education;Manual techniques;Orthotic Fit/Training;Patient/family education;Passive range of motion;Energy conservation    PT Next Visit Plan  Continue to  revisist discussion re: energy conservation and building periods of rest into his day. Begin to develop HEP that alternates neck/trunk muscle exercises with leg exercises (every other day).     Consulted and Agree with Plan of Care  Patient       Patient will benefit from skilled therapeutic intervention in order to improve the following deficits and impairments:  Abnormal gait, Decreased activity tolerance, Decreased balance, Decreased mobility, Decreased knowledge of use of DME, Decreased knowledge of precautions, Decreased endurance, Decreased safety awareness, Decreased strength, Postural dysfunction, Pain  Visit Diagnosis: Muscle weakness (generalized)  Unsteadiness on feet  Difficulty in walking, not elsewhere classified     Problem List Patient Active Problem List   Diagnosis Date Noted  . Weakness of both lower extremities 01/13/2018  . Myasthenia gravis, AChR antibody positive ([place]) 01/13/2018  . Dysarthria 12/04/2017  . Dysphagia   . CVA (cerebral vascular accident) ([place]) 11/30/2017  . Angio-edema 11/26/2017  . S/P knee replacement 09/25/2017  . Knee pain, chronic 08/14/2017  . OSA on CPAP 03/05/2017  . OSA (obstructive sleep apnea) 09/30/2016  .  S/P shoulder replacement, left 07/04/2016  . Preop exam for internal medicine 04/16/2016  . Claudication ([place]) 09/14/2015  . Pain in the chest   . Abnormal stress test 08/14/2015  . Dyspnea 07/23/2015  . Paresthesia 10/25/2014  . Weakness 04/13/2014  . Heme positive stool 02/22/2014  . Benign prostatic hyperplasia 02/22/2014  . B12 deficiency 02/21/2014  . Anemia, iron deficiency 02/21/2014  . Leg weakness, bilateral 01/24/2014  . Erectile dysfunction 01/24/2014  . Edema 01/24/2014  . URI, acute 08/31/2013  . Cellulitis of arm, right 07/13/2013  . Grief 07/13/2013  . Well adult exam 03/01/2012  . CARPAL TUNNEL SYNDROME 11/04/2010  . BENIGN PROSTATIC HYPERTROPHY, WITH URINARY OBSTRUCTION 11/04/2010  . WRIST PAIN 11/04/2010  . LEG PAIN 07/16/2010  . COUGH 05/20/2010  . TOBACCO USE, QUIT 07/04/2009  . WARTS, VIRAL, UNSPECIFIED 11/06/2008  . INSOMNIA, PERSISTENT 01/14/2008  . Essential hypertension 01/14/2008  . RHINITIS 01/14/2008  . ABDOMINAL PAIN 01/14/2008  . TREMOR 09/07/2007  . Actinic keratosis 08/12/2007  . Neoplasm of uncertain behavior of skin 07/06/2007  . LOW BACK PAIN 07/06/2007  . Dyslipidemia 06/05/2007  . Coronary atherosclerosis 06/05/2007  . Osteoarthritis 06/05/2007    Rexanne Mano, PT 03/10/2018, 2:29 PM  [facility] [address] [place], [place], [address] Phone: [phone]   Fax:  [phone]  Name: RONALDO CRILLY MRN: [id number] Date of Birth: [birth year]

## 2018-03-11 ENCOUNTER — Encounter: Payer: Self-pay | Admitting: Physical Therapy

## 2018-03-11 ENCOUNTER — Ambulatory Visit: Payer: Medicare Other | Admitting: Physical Therapy

## 2018-03-11 DIAGNOSIS — R293 Abnormal posture: Secondary | ICD-10-CM | POA: Diagnosis not present

## 2018-03-11 DIAGNOSIS — M6281 Muscle weakness (generalized): Secondary | ICD-10-CM

## 2018-03-11 DIAGNOSIS — R29898 Other symptoms and signs involving the musculoskeletal system: Secondary | ICD-10-CM | POA: Diagnosis not present

## 2018-03-11 DIAGNOSIS — R262 Difficulty in walking, not elsewhere classified: Secondary | ICD-10-CM | POA: Diagnosis not present

## 2018-03-11 DIAGNOSIS — R2681 Unsteadiness on feet: Secondary | ICD-10-CM | POA: Diagnosis not present

## 2018-03-11 NOTE — Therapy (Signed)
[facility] [address] [place], [place], [address] Phone: [phone]   Fax:  [phone]  Physical Therapy Treatment  Patient Details  Name: [name] MRN: [id number] Date of Birth: 12/25/1940 Referring Provider: Cassandria Anger, MD   Encounter Date: 03/11/2018  PT End of Session - 03/11/18 1700    Visit Number  3    Number of Visits  13    Date for PT Re-Evaluation  05/03/18    Authorization Type  [employer] Medicare    Authorization Time Period  03/04/18 to 06/02/18    PT Start Time  0759    PT Stop Time  0841    PT Time Calculation (min)  42 min    Activity Tolerance  Patient tolerated treatment well    Behavior During Therapy  [facility] for tasks assessed/performed       Past Medical History:  Diagnosis Date  . Anemia   . Blood transfusion without reported diagnosis 02/23/2014   3 units for iron deficiency anemia. as a baby - whenhe had pneumonia  . BPH (benign prostatic hyperplasia)   . CTS (carpal tunnel syndrome)    better  . Dyspnea   . GERD (gastroesophageal reflux disease)   . Hyperlipidemia   . Hypertension    "THEY DIAGNOSED ME YEARS AGO BUT LATELY IVE ACTUALLY HAD LOW BLOOD PRESSURES "   . Insomnia   . LBP (low back pain)   . Osteoarthritis   . Pneumonia    as a baby  . Sleep apnea    no cpap    Past Surgical History:  Procedure Laterality Date  . CARDIAC CATHETERIZATION N/A 08/15/2015   Procedure: Left Heart Cath and Coronary Angiography;  Surgeon: Lorretta Harp, MD;  Location: [facility];  Service: Cardiovascular;  Laterality: N/A;  . CARPAL TUNNEL RELEASE Bilateral 1996, 2004   Dr Lenoard Aden thumb surgery.  more than once  . COLONOSCOPY  2005, 2012   diverticulosis.   Marland Kitchen ESOPHAGOGASTRODUODENOSCOPY N/A 02/23/2014   Procedure: ESOPHAGOGASTRODUODENOSCOPY (EGD);  Surgeon: Irene Shipper, MD;  Location: [facility] END[facility];  Service: Endoscopy;  Laterality: N/A;  . EYE SURGERY  2012    both cataracts, Lasik  . LEFT KNEE ARTHROSCOPY Left 07/16/2017    [facility]T [facility]   . LUMBAR FUSION     x 3  . REVERSE SHOULDER ARTHROPLASTY Left 07/04/2016   Procedure: LEFT REVERSE SHOULDER ARTHROPLASTY;  Surgeon: Netta Cedars, MD;  Location: [facility];  Service: Orthopedics;  Laterality: Left;  . TONSILLECTOMY  1946  . TOTAL KNEE ARTHROPLASTY  2003   Right  . TOTAL KNEE ARTHROPLASTY Left 09/25/2017   Procedure: LEFT TOTAL KNEE ARTHROPLASTY;  Surgeon: Sydnee Cabal, MD;  Location: [facility];  Service: Orthopedics;  Laterality: Left;    There were no vitals filed for this visit.  Subjective Assessment - 03/11/18 0759    Subjective  I rode my bicycle for about 10 minutes last night. (After PT clearly instructed him to do at most 6 minutes, followed by 3 minute rest, and then up to another 6 min).     Patient is accompained by:  Family member wife    Pertinent History  anemia, bil TKR (left 09/25/17), lumbar fusion, left shoulder arthroplasty, HTN,     Limitations  Walking;Standing    Diagnostic tests  labs confirmed Myasthenia Gravis    Patient Stated Goals  get stronger, especially my neck so it will stop hurting; be able to walk without  stumbling/falling; be able to ride bike again    Currently in Pain?  No/denies         Treatment- Developed HEP with all reps and sets as outlined below to assess tolerance and appropriate resistance band.   Exercises  Bridge with Hip Abduction and Resistance - Ground Touches - 8 reps - 3 sets - 3 hold - 2x daily - 3x weekly  Straight Leg Raise with External Rotation - 8 reps - 3 sets - 3 hold - 2x daily - 3x weekly  Standing Hamstring Curl with Resistance - 16 reps - 3 sets - 3 hold - 2x daily - 3x weekly  Heel Raise - 20 reps - 3 sets - 3 hold - 2x daily - 3x weekly  Shoulder External Rotation and Scapular Retraction with Resistance - 8 reps - 3 sets - 3 hold - 2x daily - 3x weekly   Patient Educated to complete leg exercises 3 days/week, alternating with  neck/UE exercises 3 days/week (will be adding additional UE exercises)                      PT Education - 03/11/18 1658    Education Details  additions to HEP with plan to alternate days to give muscles a day of rest between exercises    Person(s) Educated  Patient    Methods  Explanation;Demonstration;Verbal cues;Handout;Tactile cues    Comprehension  Verbalized understanding;Returned demonstration;Verbal cues required;Tactile cues required;Need further instruction       PT Short Term Goals - 03/10/18 1426      PT SHORT TERM GOAL #1   Title  Patient will be independent with initial HEP, including able to verbalize how to regulate intensity based on his symptoms (Unless otherwise indicated, target all STGs 04/03/2018)    Time  4    Period  Weeks    Status  On-going      PT SHORT TERM GOAL #2   Title  Patient will complete 6 MWT to assist with exercise prescription.     Time  1    Period  Weeks    Status  Achieved      PT SHORT TERM GOAL #3   Title  Patient will complete FGA +/- Berg for fall risk prediction with follow-up goal to be set as appropriate    Baseline  7/17 FGA 20/30    Time  1    Period  Weeks    Status  Achieved      PT SHORT TERM GOAL #4   Title  If indicated, patient will be educated in proper use of appropriate assistive device to use to reduce fall risk.     Time  4    Period  Weeks    Status  New      PT SHORT TERM GOAL #5   Title  Patient will improve FGA to >=23 to demonstrate lesser fall risk    Time  4    Period  Weeks    Status  New        PT Long Term Goals - 03/05/18 1333      PT LONG TERM GOAL #1   Title  Patient will be independent with updated HEP and able to verbalize his plan for return to community-based exercise program. (Target all LTGs 05/03/2018)    Time  8    Period  Weeks    Status  New    Target Date  05/03/18  PT LONG TERM GOAL #2   Title  Patient will improve 6 MWT distance by 100 ft     Time  8     Status  New      PT LONG TERM GOAL #3   Title  Patient will demonstrate reduced fall risk via achieving a minimally detectable change in score on either FGA or Berg (depending on initial test results).    Time  8    Period  Weeks    Status  New      PT LONG TERM GOAL #4   Title  Patient will be educated on role of Occupational Therapy and referral obtained from MD if appropriate and pt agreeable.     Time  8    Period  Weeks    Status  New            Plan - 03/11/18 1701    Clinical Impression Statement  Focused on establishing a HEP for his lower body and separate exercises for neck/UEs (mostly complete, but will need additions for upper body). Plan is for pt to alternate doing lower body exercises every other day with upper body exercises every other day to minimize overworking any muscle group and risk causing increased weakness. Patient can continue to benefit from PT to progress him towards tolerating riding his bicycle (pt goal).     Rehab Potential  Good    PT Frequency  Other (comment) 2x/week x 4 weeks; 1x/week x 4 weeks    PT Duration  8 weeks    PT Treatment/Interventions  ADLs/Self Care Home Management;Aquatic Therapy;Biofeedback;Cryotherapy;Gait training;DME Instruction;Functional mobility training;Therapeutic activities;Therapeutic exercise;Balance training;Neuromuscular re-education;Manual techniques;Orthotic Fit/Training;Patient/family education;Passive range of motion;Energy conservation    PT Next Visit Plan  continue to develop HEP that alternates neck/trunk muscle exercises with leg exercises (every other day)--needs additional neck/shoulder exercises for those days; balance training, gait with dual tasks    Consulted and Agree with Plan of Care  Patient       Patient will benefit from skilled therapeutic intervention in order to improve the following deficits and impairments:  Abnormal gait, Decreased activity tolerance, Decreased balance, Decreased mobility,  Decreased knowledge of use of DME, Decreased knowledge of precautions, Decreased endurance, Decreased safety awareness, Decreased strength, Postural dysfunction, Pain  Visit Diagnosis: Muscle weakness (generalized)     Problem List Patient Active Problem List   Diagnosis Date Noted  . Weakness of both lower extremities 01/13/2018  . Myasthenia gravis, AChR antibody positive ([place]) 01/13/2018  . Dysarthria 12/04/2017  . Dysphagia   . CVA (cerebral vascular accident) ([place]) 11/30/2017  . Angio-edema 11/26/2017  . S/P knee replacement 09/25/2017  . Knee pain, chronic 08/14/2017  . OSA on CPAP 03/05/2017  . OSA (obstructive sleep apnea) 09/30/2016  . S/P shoulder replacement, left 07/04/2016  . Preop exam for internal medicine 04/16/2016  . Claudication ([place]) 09/14/2015  . Pain in the chest   . Abnormal stress test 08/14/2015  . Dyspnea 07/23/2015  . Paresthesia 10/25/2014  . Weakness 04/13/2014  . Heme positive stool 02/22/2014  . Benign prostatic hyperplasia 02/22/2014  . B12 deficiency 02/21/2014  . Anemia, iron deficiency 02/21/2014  . Leg weakness, bilateral 01/24/2014  . Erectile dysfunction 01/24/2014  . Edema 01/24/2014  . URI, acute 08/31/2013  . Cellulitis of arm, right 07/13/2013  . Grief 07/13/2013  . Well adult exam 03/01/2012  . CARPAL TUNNEL SYNDROME 11/04/2010  . BENIGN PROSTATIC HYPERTROPHY, WITH URINARY OBSTRUCTION 11/04/2010  . WRIST PAIN 11/04/2010  .  LEG PAIN 07/16/2010  . COUGH 05/20/2010  . TOBACCO USE, QUIT 07/04/2009  . WARTS, VIRAL, UNSPECIFIED 11/06/2008  . INSOMNIA, PERSISTENT 01/14/2008  . Essential hypertension 01/14/2008  . RHINITIS 01/14/2008  . ABDOMINAL PAIN 01/14/2008  . TREMOR 09/07/2007  . Actinic keratosis 08/12/2007  . Neoplasm of uncertain behavior of skin 07/06/2007  . LOW BACK PAIN 07/06/2007  . Dyslipidemia 06/05/2007  . Coronary atherosclerosis 06/05/2007  . Osteoarthritis 06/05/2007    [name], PT 03/11/2018,  5:06 PM  [facility] [address] [place], [place], [address] Phone: [phone]   Fax:  [phone]  Name: [name] MRN: [id number] Date of Birth: July 26, 1941

## 2018-03-11 NOTE — Patient Instructions (Addendum)
Access Code: [email or web address]  URL: [email or web address]  Date: 03/11/2018  Prepared by: Barry Brunner   Exercises  Bridge with Hip Abduction and Resistance - Ground Touches - 8 reps - 3 sets - 3 hold - 2x daily - 3x weekly  Straight Leg Raise with External Rotation - 8 reps - 3 sets - 3 hold - 2x daily - 3x weekly  Standing Hamstring Curl with Resistance - 16 reps - 3 sets - 3 hold - 2x daily - 3x weekly  Heel Raise - 20 reps - 3 sets - 3 hold - 2x daily - 3x weekly  Shoulder External Rotation and Scapular Retraction with Resistance - 8 reps - 3 sets - 3 hold - 2x daily - 3x weekly   Patient Educated to complete leg exercises 3 days/week, alternating with neck/UE exercises 3 days/week (will be adding additional UE exercises)

## 2018-03-16 ENCOUNTER — Ambulatory Visit: Payer: Medicare Other | Admitting: Physical Therapy

## 2018-03-16 ENCOUNTER — Encounter: Payer: Self-pay | Admitting: Physical Therapy

## 2018-03-16 DIAGNOSIS — R293 Abnormal posture: Secondary | ICD-10-CM | POA: Diagnosis not present

## 2018-03-16 DIAGNOSIS — R262 Difficulty in walking, not elsewhere classified: Secondary | ICD-10-CM | POA: Diagnosis not present

## 2018-03-16 DIAGNOSIS — M6281 Muscle weakness (generalized): Secondary | ICD-10-CM

## 2018-03-16 DIAGNOSIS — R29898 Other symptoms and signs involving the musculoskeletal system: Secondary | ICD-10-CM | POA: Diagnosis not present

## 2018-03-16 DIAGNOSIS — R2681 Unsteadiness on feet: Secondary | ICD-10-CM | POA: Diagnosis not present

## 2018-03-16 NOTE — Therapy (Signed)
[facility] [address] [place], [place], [address] Phone: [phone]   Fax:  [phone]  Physical Therapy Treatment  Patient Details  Name: [name] MRN: [id number] Date of Birth: 03-17-1941 Referring Provider: Cassandria Anger, MD   Encounter Date: 03/16/2018  PT End of Session - 03/16/18 0942    Visit Number  4    Number of Visits  13    Date for PT Re-Evaluation  05/03/18    Authorization Type  [employer] Medicare    Authorization Time Period  03/04/18 to 06/02/18    PT Start Time  0847    PT Stop Time  0927    PT Time Calculation (min)  40 min    Activity Tolerance  Patient tolerated treatment well;No increased pain    Behavior During Therapy  WFL for tasks assessed/performed       Past Medical History:  Diagnosis Date  . Anemia   . Blood transfusion without reported diagnosis 02/23/2014   3 units for iron deficiency anemia. as a baby - whenhe had pneumonia  . BPH (benign prostatic hyperplasia)   . CTS (carpal tunnel syndrome)    better  . Dyspnea   . GERD (gastroesophageal reflux disease)   . Hyperlipidemia   . Hypertension    "THEY DIAGNOSED ME YEARS AGO BUT LATELY IVE ACTUALLY HAD LOW BLOOD PRESSURES "   . Insomnia   . LBP (low back pain)   . Osteoarthritis   . Pneumonia    as a baby  . Sleep apnea    no cpap    Past Surgical History:  Procedure Laterality Date  . CARDIAC CATHETERIZATION N/A 08/15/2015   Procedure: Left Heart Cath and Coronary Angiography;  Surgeon: Lorretta Harp, MD;  Location: [facility];  Service: Cardiovascular;  Laterality: N/A;  . CARPAL TUNNEL RELEASE Bilateral 1996, 2004   Dr Lenoard Aden thumb surgery.  more than once  . COLONOSCOPY  2005, 2012   diverticulosis.   Marland Kitchen ESOPHAGOGASTRODUODENOSCOPY N/A 02/23/2014   Procedure: ESOPHAGOGASTRODUODENOSCOPY (EGD);  Surgeon: Irene Shipper, MD;  Location: [facility];  Service: Endoscopy;  Laterality: N/A;  .  EYE SURGERY  2012   both cataracts, Lasik  . LEFT KNEE ARTHROSCOPY Left 07/16/2017    [facility]   . LUMBAR FUSION     x 3  . REVERSE SHOULDER ARTHROPLASTY Left 07/04/2016   Procedure: LEFT REVERSE SHOULDER ARTHROPLASTY;  Surgeon: Netta Cedars, MD;  Location: [facility];  Service: Orthopedics;  Laterality: Left;  . TONSILLECTOMY  1946  . TOTAL KNEE ARTHROPLASTY  2003   Right  . TOTAL KNEE ARTHROPLASTY Left 09/25/2017   Procedure: LEFT TOTAL KNEE ARTHROPLASTY;  Surgeon: Sydnee Cabal, MD;  Location: [facility];  Service: Orthopedics;  Laterality: Left;    There were no vitals filed for this visit.  Subjective Assessment - 03/16/18 0850    Subjective  My neck is hurting. I probably walked too much over the weekend (went shopping with wife).     Patient is accompained by:  Family member wife    Pertinent History  anemia, bil TKR (left 09/25/17), lumbar fusion, left shoulder arthroplasty, HTN,     Limitations  Walking;Standing    Diagnostic tests  labs confirmed Myasthenia Gravis    Patient Stated Goals  get stronger, especially my neck so it will stop hurting; be able to walk without stumbling/falling; be able to ride bike again    Currently in Pain?  Yes  Pain Score  5     Pain Location  Neck    Pain Orientation  Posterior    Pain Descriptors / Indicators  Aching    Pain Type  Acute pain    Pain Radiating Towards  base of neck up to base of skull    Pain Onset  1 to 4 weeks ago    Pain Frequency  Intermittent    Aggravating Factors   overdoing time he is up walking    Pain Relieving Factors  using soft collar, lying down          Treatment-  See education section for details on how to pace himself, additions to HEP, etc.  There-ex-focused on additions to HEP for neck and shoulders to assist with pain relief due to overworked/fatigued neck extensors resulting in extreme forward head/neck flexed posture; strengthen and stretching posterior cervical and upper thoracic muscles (see HEP for  details) Pt able to tolerate 2-3 sets of all exercises at 5-8 reps with 3 second holds                     PT Education - 03/16/18 0940    Education Details  reviewed use of soft collar (again)--pt understood me to say to NOT use the collar at all; explained to use it when neck gets too fatigued and difficulty holding his head up to give neck muscles a rest and relieve neck pain; updated HEP; reiterated multiple times rationale for needed rest and slow progression with activities/exercises "listen to your body"; what is MG and mechanism of weakness    Person(s) Educated  Patient    Methods  Explanation;Demonstration;Verbal cues;Handout;Tactile cues    Comprehension  Verbalized understanding;Returned demonstration;Verbal cues required;Tactile cues required;Need further instruction       PT Short Term Goals - 03/10/18 1426      PT SHORT TERM GOAL #1   Title  Patient will be independent with initial HEP, including able to verbalize how to regulate intensity based on his symptoms (Unless otherwise indicated, target all STGs 04/03/2018)    Time  4    Period  Weeks    Status  On-going      PT SHORT TERM GOAL #2   Title  Patient will complete 6 MWT to assist with exercise prescription.     Time  1    Period  Weeks    Status  Achieved      PT SHORT TERM GOAL #3   Title  Patient will complete FGA +/- Berg for fall risk prediction with follow-up goal to be set as appropriate    Baseline  7/17 FGA 20/30    Time  1    Period  Weeks    Status  Achieved      PT SHORT TERM GOAL #4   Title  If indicated, patient will be educated in proper use of appropriate assistive device to use to reduce fall risk.     Time  4    Period  Weeks    Status  New      PT SHORT TERM GOAL #5   Title  Patient will improve FGA to >=23 to demonstrate lesser fall risk    Time  4    Period  Weeks    Status  New        PT Long Term Goals - 03/05/18 1333      PT LONG TERM GOAL #1   Title   Patient  will be independent with updated HEP and able to verbalize his plan for return to community-based exercise program. (Target all LTGs 05/03/2018)    Time  8    Period  Weeks    Status  New    Target Date  05/03/18      PT LONG TERM GOAL #2   Title  Patient will improve 6 MWT distance by 100 ft     Time  8    Status  New      PT LONG TERM GOAL #3   Title  Patient will demonstrate reduced fall risk via achieving a minimally detectable change in score on either FGA or Berg (depending on initial test results).    Time  8    Period  Weeks    Status  New      PT LONG TERM GOAL #4   Title  Patient will be educated on role of Occupational Therapy and referral obtained from MD if appropriate and pt agreeable.     Time  8    Period  Weeks    Status  New            Plan - 03/16/18 0943    Clinical Impression Statement  Focus of session on education (see education section for details). Patient beginning to understand how he sets himself back when he overdoes his activity. Able to verbalize that he was going to go home and rest after PT session when asked about the remainder of his day! Patient now has basic HEP for LEs and neck/shoulders and can focus on other activities on next visit.     Rehab Potential  Good    PT Frequency  Other (comment) 2x/week x 4 weeks; 1x/week x 4 weeks    PT Duration  8 weeks    PT Treatment/Interventions  ADLs/Self Care Home Management;Aquatic Therapy;Biofeedback;Cryotherapy;Gait training;DME Instruction;Functional mobility training;Therapeutic activities;Therapeutic exercise;Balance training;Neuromuscular re-education;Manual techniques;Orthotic Fit/Training;Patient/family education;Passive range of motion;Energy conservation    PT Next Visit Plan  see if ?'s re: recent neck/shoulder ex additions to HEP; balance training, gait with dual tasks    Consulted and Agree with Plan of Care  Patient       Patient will benefit from skilled therapeutic  intervention in order to improve the following deficits and impairments:  Abnormal gait, Decreased activity tolerance, Decreased balance, Decreased mobility, Decreased knowledge of use of DME, Decreased knowledge of precautions, Decreased endurance, Decreased safety awareness, Decreased strength, Postural dysfunction, Pain  Visit Diagnosis: Muscle weakness (generalized)  Abnormal posture     Problem List Patient Active Problem List   Diagnosis Date Noted  . Weakness of both lower extremities 01/13/2018  . Myasthenia gravis, AChR antibody positive ([place]) 01/13/2018  . Dysarthria 12/04/2017  . Dysphagia   . CVA (cerebral vascular accident) ([place]) 11/30/2017  . Angio-edema 11/26/2017  . S/P knee replacement 09/25/2017  . Knee pain, chronic 08/14/2017  . OSA on CPAP 03/05/2017  . OSA (obstructive sleep apnea) 09/30/2016  . S/P shoulder replacement, left 07/04/2016  . Preop exam for internal medicine 04/16/2016  . Claudication ([place]) 09/14/2015  . Pain in the chest   . Abnormal stress test 08/14/2015  . Dyspnea 07/23/2015  . Paresthesia 10/25/2014  . Weakness 04/13/2014  . Heme positive stool 02/22/2014  . Benign prostatic hyperplasia 02/22/2014  . B12 deficiency 02/21/2014  . Anemia, iron deficiency 02/21/2014  . Leg weakness, bilateral 01/24/2014  . Erectile dysfunction 01/24/2014  . Edema 01/24/2014  . URI, acute 08/31/2013  .  Cellulitis of arm, right 07/13/2013  . Grief 07/13/2013  . Well adult exam 03/01/2012  . CARPAL TUNNEL SYNDROME 11/04/2010  . BENIGN PROSTATIC HYPERTROPHY, WITH URINARY OBSTRUCTION 11/04/2010  . WRIST PAIN 11/04/2010  . LEG PAIN 07/16/2010  . COUGH 05/20/2010  . TOBACCO USE, QUIT 07/04/2009  . WARTS, VIRAL, UNSPECIFIED 11/06/2008  . INSOMNIA, PERSISTENT 01/14/2008  . Essential hypertension 01/14/2008  . RHINITIS 01/14/2008  . ABDOMINAL PAIN 01/14/2008  . TREMOR 09/07/2007  . Actinic keratosis 08/12/2007  . Neoplasm of uncertain behavior of skin  07/06/2007  . LOW BACK PAIN 07/06/2007  . Dyslipidemia 06/05/2007  . Coronary atherosclerosis 06/05/2007  . Osteoarthritis 06/05/2007    Rexanne Mano, PT 03/16/2018, 9:46 AM  [facility] [address] [place], [place], [address] Phone: [phone]   Fax:  [phone]  Name: [name] MRN: [id number] Date of Birth: Jan 19, 1941

## 2018-03-16 NOTE — Patient Instructions (Signed)
Access Code: [email or web address]  URL: [email or web address]  Date: 03/16/2018  Prepared by: Barry Brunner   Exercises  Bridge with Hip Abduction and Resistance - Ground Touches - 8 reps - 3 sets - 3 hold - 2x daily - 3x weekly  Straight Leg Raise with External Rotation - 8 reps - 3 sets - 3 hold - 2x daily - 3x weekly  Standing Hamstring Curl with Resistance - 16 reps - 3 sets - 3 hold - 2x daily - 3x weekly  Heel Raise - 20 reps - 3 sets - 3 hold - 2x daily - 3x weekly  Shoulder External Rotation and Scapular Retraction with Resistance - 8 reps - 3 sets - 3 hold - 2x daily - 3x weekly  Standing Serratus Punch with Resistance - 8 reps - 3 sets - 3 hold - 2x daily - 3x weekly  Supine Cervical Retraction with Towel - 8 reps - 3 sets - 2x daily - 3x weekly  Standing Shoulder Posterior Capsule Stretch - 3 reps - 30 hold   Divided exercises into 2 packets: LE and UE for alternating days

## 2018-03-19 ENCOUNTER — Encounter: Payer: Self-pay | Admitting: Physical Therapy

## 2018-03-19 ENCOUNTER — Ambulatory Visit: Payer: Medicare Other | Admitting: Physical Therapy

## 2018-03-19 DIAGNOSIS — R293 Abnormal posture: Secondary | ICD-10-CM | POA: Diagnosis not present

## 2018-03-19 DIAGNOSIS — R29898 Other symptoms and signs involving the musculoskeletal system: Secondary | ICD-10-CM

## 2018-03-19 DIAGNOSIS — R262 Difficulty in walking, not elsewhere classified: Secondary | ICD-10-CM | POA: Diagnosis not present

## 2018-03-19 DIAGNOSIS — M6281 Muscle weakness (generalized): Secondary | ICD-10-CM

## 2018-03-19 DIAGNOSIS — R2681 Unsteadiness on feet: Secondary | ICD-10-CM | POA: Diagnosis not present

## 2018-03-19 DIAGNOSIS — G7000 Myasthenia gravis without (acute) exacerbation: Secondary | ICD-10-CM | POA: Diagnosis not present

## 2018-03-20 NOTE — Therapy (Signed)
[facility] [address] [place], [place], [address] Phone: [phone]   Fax:  [phone]  Physical Therapy Treatment  Patient Details  Name: [name] MRN: [id number] Date of Birth: 1940/11/18 Referring Provider: Cassandria Anger, MD   Encounter Date: 03/19/2018  PT End of Session - 03/20/18 0828    Visit Number  5    Number of Visits  13    Date for PT Re-Evaluation  05/03/18    Authorization Type  [employer]HC Medicare    Authorization Time Period  03/04/18 to 06/02/18    PT Start Time  0845    PT Stop Time  0930    PT Time Calculation (min)  45 min    Activity Tolerance  Patient tolerated treatment well;No increased pain pt reported decreased pain at end of session    Behavior During Therapy  [facility] for tasks assessed/performed       Past Medical History:  Diagnosis Date  . Anemia   . Blood transfusion without reported diagnosis 02/23/2014   3 units for iron deficiency anemia. as a baby - whenhe had pneumonia  . BPH (benign prostatic hyperplasia)   . CTS (carpal tunnel syndrome)    better  . Dyspnea   . GERD (gastroesophageal reflux disease)   . Hyperlipidemia   . Hypertension    "THEY DIAGNOSED ME YEARS AGO BUT LATELY IVE ACTUALLY HAD LOW BLOOD PRESSURES "   . Insomnia   . LBP (low back pain)   . Osteoarthritis   . Pneumonia    as a baby  . Sleep apnea    no cpap    Past Surgical History:  Procedure Laterality Date  . CARDIAC CATHETERIZATION N/A 08/15/2015   Procedure: Left Heart Cath and Coronary Angiography;  Surgeon: Lorretta Harp, MD;  Location: [facility];  Service: Cardiovascular;  Laterality: N/A;  . CARPAL TUNNEL RELEASE Bilateral 1996, 2004   Dr Lenoard Aden thumb surgery.  more than once  . COLONOSCOPY  2005, 2012   diverticulosis.   Marland Kitchen ESOPHAGOGASTRODUODENOSCOPY N/A 02/23/2014   Procedure: ESOPHAGOGASTRODUODENOSCOPY (EGD);  Surgeon: Irene Shipper, MD;  Location: [facility];   Service: Endoscopy;  Laterality: N/A;  . EYE SURGERY  2012   both cataracts, Lasik  . LEFT KNEE ARTHROSCOPY Left 07/16/2017    [facility]T [facility]   . LUMBAR FUSION     x 3  . REVERSE SHOULDER ARTHROPLASTY Left 07/04/2016   Procedure: LEFT REVERSE SHOULDER ARTHROPLASTY;  Surgeon: Netta Cedars, MD;  Location: [facility];  Service: Orthopedics;  Laterality: Left;  . TONSILLECTOMY  1946  . TOTAL KNEE ARTHROPLASTY  2003   Right  . TOTAL KNEE ARTHROPLASTY Left 09/25/2017   Procedure: LEFT TOTAL KNEE ARTHROPLASTY;  Surgeon: Sydnee Cabal, MD;  Location: [facility];  Service: Orthopedics;  Laterality: Left;    There were no vitals filed for this visit.  Subjective Assessment - 03/19/18 0844    Subjective  Reports neck has been more painful, stiff, and weaker. Reports having more difficulty again holding his head up. Has an appointment at [facility] today for further assessment/testing    Patient is accompained by:  Family member wife    Pertinent History  anemia, bil TKR (left 09/25/17), lumbar fusion, left shoulder arthroplasty, HTN,     Limitations  Walking;Standing    Diagnostic tests  labs confirmed Myasthenia Gravis    Patient Stated Goals  get stronger, especially my neck so it will stop hurting; be able  to walk without stumbling/falling; be able to ride bike again    Currently in Pain?  Yes    Pain Score  8     Pain Location  Neck    Pain Orientation  Posterior    Pain Descriptors / Indicators  Aching;Heaviness    Pain Type  Acute pain    Pain Radiating Towards  base of neck up to skull    Pain Onset  1 to 4 weeks ago    Pain Frequency  Intermittent                       [facility] Adult PT Treatment/Exercise - 03/19/18 1700      Exercises   Exercises  Neck      Neck Exercises: Supine   Other Supine Exercise  overpressure with right hand to left side of neck at C5-6 while left hand is on right temple resisting rt cervical rotation      Modalities   Modalities  Moist Heat      Moist Heat  Therapy   Number Minutes Moist Heat  10 Minutes while discussing plan to update his HEP     Moist Heat Location  Cervical      Manual Therapy   Manual Therapy  Joint mobilization;Soft tissue mobilization;Passive ROM;Manual Traction    Manual therapy comments  to cervical spine to reduce pain and stiffness; noted increased tenderness at C5, C6 on the right with ?slight left rotation of C5 on C6    Joint Mobilization  grade I-II mobs to C5, C6 in rotation and side-bending    Soft tissue mobilization  bil cervical paraspinals, upper trapezius to reduce muscle tension, stiffness, pain    Passive ROM  in supine, cervical rotation, lateral flexion    Manual Traction  in neutral--straight traction and traction followed by cervical rotation; in 15-20 degrees flexion;              PT Education - 03/20/18 0826    Education Details  reviewed use of soft collar and pt using appropriately; removed exercises with resistance band (for UEs/neck) and added one neck exercise to HEP    Person(s) Educated  Patient    Methods  Explanation;Demonstration;Verbal cues;Handout    Comprehension  Verbalized understanding;Returned demonstration;Verbal cues required;Need further instruction       PT Short Term Goals - 03/10/18 1426      PT SHORT TERM GOAL #1   Title  Patient will be independent with initial HEP, including able to verbalize how to regulate intensity based on his symptoms (Unless otherwise indicated, target all STGs 04/03/2018)    Time  4    Period  Weeks    Status  On-going      PT SHORT TERM GOAL #2   Title  Patient will complete 6 MWT to assist with exercise prescription.     Time  1    Period  Weeks    Status  Achieved      PT SHORT TERM GOAL #3   Title  Patient will complete FGA +/- Berg for fall risk prediction with follow-up goal to be set as appropriate    Baseline  7/17 FGA 20/30    Time  1    Period  Weeks    Status  Achieved      PT SHORT TERM GOAL #4   Title  If  indicated, patient will be educated in proper use of appropriate assistive device to use  to reduce fall risk.     Time  4    Period  Weeks    Status  New      PT SHORT TERM GOAL #5   Title  Patient will improve FGA to >=23 to demonstrate lesser fall risk    Time  4    Period  Weeks    Status  New        PT Long Term Goals - 03/05/18 1333      PT LONG TERM GOAL #1   Title  Patient will be independent with updated HEP and able to verbalize his plan for return to community-based exercise program. (Target all LTGs 05/03/2018)    Time  8    Period  Weeks    Status  New    Target Date  05/03/18      PT LONG TERM GOAL #2   Title  Patient will improve 6 MWT distance by 100 ft     Time  8    Status  New      PT LONG TERM GOAL #3   Title  Patient will demonstrate reduced fall risk via achieving a minimally detectable change in score on either FGA or Berg (depending on initial test results).    Time  8    Period  Weeks    Status  New      PT LONG TERM GOAL #4   Title  Patient will be educated on role of Occupational Therapy and referral obtained from MD if appropriate and pt agreeable.     Time  8    Period  Weeks    Status  New            Plan - 03/20/18 0830    Clinical Impression Statement  Session focused on revising HEP for neck and shoulders due to patient's increased neck pain on arrival and noted misalignment of C5-6 with left rotated position. Patient also reporting RUE radiculopathy below elbow which improved to above elbow after manual therapy and exercises for neck. Noted Lt deltoid fasiculations while performing manual therapy, PROM to neck and wasting of muscles in bil hands observed. Patient going to [facility] for further work-up today.    Rehab Potential  Good    PT Frequency  Other (comment) 2x/week x 4 weeks; 1x/week x 4 weeks    PT Duration  8 weeks    PT Treatment/Interventions  ADLs/Self Care Home Management;Aquatic Therapy;Biofeedback;Cryotherapy;Gait  training;DME Instruction;Functional mobility training;Therapeutic activities;Therapeutic exercise;Balance training;Neuromuscular re-education;Manual techniques;Orthotic Fit/Training;Patient/family education;Passive range of motion;Energy conservation    PT Next Visit Plan  ? results from trip to [place];  add balance exercises to HEP; address neck as needed   Consulted and Agree with Plan of Care  Patient       Patient will benefit from skilled therapeutic intervention in order to improve the following deficits and impairments:  Abnormal gait, Decreased activity tolerance, Decreased balance, Decreased mobility, Decreased knowledge of use of DME, Decreased knowledge of precautions, Decreased endurance, Decreased safety awareness, Decreased strength, Postural dysfunction, Pain  Visit Diagnosis: Muscle weakness (generalized)  Abnormal posture  Other symptoms and signs involving the musculoskeletal system     Problem List Patient Active Problem List   Diagnosis Date Noted  . Weakness of both lower extremities 01/13/2018  . Myasthenia gravis, AChR antibody positive ([place]) 01/13/2018  . Dysarthria 12/04/2017  . Dysphagia   . CVA (cerebral vascular accident) ([place]) 11/30/2017  . Angio-edema 11/26/2017  . S/P knee replacement  09/25/2017  . Knee pain, chronic 08/14/2017  . OSA on CPAP 03/05/2017  . OSA (obstructive sleep apnea) 09/30/2016  . S/P shoulder replacement, left 07/04/2016  . Preop exam for internal medicine 04/16/2016  . Claudication ([place]) 09/14/2015  . Pain in the chest   . Abnormal stress test 08/14/2015  . Dyspnea 07/23/2015  . Paresthesia 10/25/2014  . Weakness 04/13/2014  . Heme positive stool 02/22/2014  . Benign prostatic hyperplasia 02/22/2014  . B12 deficiency 02/21/2014  . Anemia, iron deficiency 02/21/2014  . Leg weakness, bilateral 01/24/2014  . Erectile dysfunction 01/24/2014  . Edema 01/24/2014  . URI, acute 08/31/2013  . Cellulitis of arm, right 07/13/2013   . Grief 07/13/2013  . Well adult exam 03/01/2012  . CARPAL TUNNEL SYNDROME 11/04/2010  . BENIGN PROSTATIC HYPERTROPHY, WITH URINARY OBSTRUCTION 11/04/2010  . WRIST PAIN 11/04/2010  . LEG PAIN 07/16/2010  . COUGH 05/20/2010  . TOBACCO USE, QUIT 07/04/2009  . WARTS, VIRAL, UNSPECIFIED 11/06/2008  . INSOMNIA, PERSISTENT 01/14/2008  . Essential hypertension 01/14/2008  . RHINITIS 01/14/2008  . ABDOMINAL PAIN 01/14/2008  . TREMOR 09/07/2007  . Actinic keratosis 08/12/2007  . Neoplasm of uncertain behavior of skin 07/06/2007  . LOW BACK PAIN 07/06/2007  . Dyslipidemia 06/05/2007  . Coronary atherosclerosis 06/05/2007  . Osteoarthritis 06/05/2007    Rexanne Mano, PT 03/20/2018, 8:37 AM  [facility] [address] [place], [place], [address] Phone: [phone]   Fax:  [phone]  Name: LISA BLAKEMAN MRN: [id number] Date of Birth: 1940/12/22

## 2018-03-20 NOTE — Patient Instructions (Signed)
Access Code: [email or web address]  URL: [email or web address]  Date: 03/19/2018  Prepared by: Barry Brunner   Exercises  Bridge with Hip Abduction and Resistance - Ground Touches - 8 reps - 3 sets - 3 hold - 2x daily - 3x weekly  Straight Leg Raise with External Rotation - 8 reps - 3 sets - 3 hold - 2x daily - 3x weekly  Standing Hamstring Curl with Resistance - 16 reps - 3 sets - 3 hold - 2x daily - 3x weekly  Heel Raise - 20 reps - 3 sets - 3 hold - 2x daily - 3x weekly  Shoulder External Rotation and Scapular Retraction with Resistance - 8 reps - 3 sets - 3 hold - 2x daily - 3x weekly  Standing Serratus Punch with Resistance - 8 reps - 3 sets - 3 hold - 2x daily - 3x weekly  Supine Cervical Retraction with Towel - 8 reps - 3 sets - 2x daily - 3x weekly  Standing Shoulder Posterior Capsule Stretch - 3 reps - 30 hold   Added today: Hooklying Isometric Upper Neck Rotation - 10 reps - 1 sets - 5 hold - 2x daily  Also instructed to hold off on exercises above using resistance band

## 2018-03-23 DIAGNOSIS — G7000 Myasthenia gravis without (acute) exacerbation: Secondary | ICD-10-CM | POA: Diagnosis not present

## 2018-03-24 ENCOUNTER — Encounter: Payer: Self-pay | Admitting: Physical Therapy

## 2018-03-24 ENCOUNTER — Ambulatory Visit: Payer: Medicare Other | Admitting: Physical Therapy

## 2018-03-24 DIAGNOSIS — R293 Abnormal posture: Secondary | ICD-10-CM | POA: Diagnosis not present

## 2018-03-24 DIAGNOSIS — R2681 Unsteadiness on feet: Secondary | ICD-10-CM

## 2018-03-24 DIAGNOSIS — Z961 Presence of intraocular lens: Secondary | ICD-10-CM | POA: Diagnosis not present

## 2018-03-24 DIAGNOSIS — H26492 Other secondary cataract, left eye: Secondary | ICD-10-CM | POA: Diagnosis not present

## 2018-03-24 DIAGNOSIS — R29898 Other symptoms and signs involving the musculoskeletal system: Secondary | ICD-10-CM | POA: Diagnosis not present

## 2018-03-24 DIAGNOSIS — M6281 Muscle weakness (generalized): Secondary | ICD-10-CM | POA: Diagnosis not present

## 2018-03-24 DIAGNOSIS — H04123 Dry eye syndrome of bilateral lacrimal glands: Secondary | ICD-10-CM | POA: Diagnosis not present

## 2018-03-24 DIAGNOSIS — R262 Difficulty in walking, not elsewhere classified: Secondary | ICD-10-CM | POA: Diagnosis not present

## 2018-03-24 DIAGNOSIS — B301 Conjunctivitis due to adenovirus: Secondary | ICD-10-CM | POA: Diagnosis not present

## 2018-03-25 NOTE — Therapy (Signed)
[facility] [address] [place], [place], [address] Phone: [phone]   Fax:  [phone]  Physical Therapy Treatment  Patient Details  Name: [name] MRN: [id number] Date of Birth: 07/15/1941 Referring Provider: Cassandria Anger, MD   Encounter Date: 03/24/2018  PT End of Session - 03/25/18 1251    Visit Number  6    Number of Visits  13    Date for PT Re-Evaluation  05/03/18    Authorization Type  [employer] Medicare    Authorization Time Period  03/04/18 to 06/02/18    PT Start Time  0848    PT Stop Time  0932    PT Time Calculation (min)  44 min    Activity Tolerance  Patient tolerated treatment well;No increased pain reports neck fatigue at end of session, but no pain    Behavior During Therapy  [facility] for tasks assessed/performed       Past Medical History:  Diagnosis Date  . Anemia   . Blood transfusion without reported diagnosis 02/23/2014   3 units for iron deficiency anemia. as a baby - whenhe had pneumonia  . BPH (benign prostatic hyperplasia)   . CTS (carpal tunnel syndrome)    better  . Dyspnea   . GERD (gastroesophageal reflux disease)   . Hyperlipidemia   . Hypertension    "THEY DIAGNOSED ME YEARS AGO BUT LATELY IVE ACTUALLY HAD LOW BLOOD PRESSURES "   . Insomnia   . LBP (low back pain)   . Osteoarthritis   . Pneumonia    as a baby  . Sleep apnea    no cpap    Past Surgical History:  Procedure Laterality Date  . CARDIAC CATHETERIZATION N/A 08/15/2015   Procedure: Left Heart Cath and Coronary Angiography;  Surgeon: Lorretta Harp, MD;  Location: [facility];  Service: Cardiovascular;  Laterality: N/A;  . CARPAL TUNNEL RELEASE Bilateral 1996, 2004   Dr Lenoard Aden thumb surgery.  more than once  . COLONOSCOPY  2005, 2012   diverticulosis.   Marland Kitchen ESOPHAGOGASTRODUODENOSCOPY N/A 02/23/2014   Procedure: ESOPHAGOGASTRODUODENOSCOPY (EGD);  Surgeon: Irene Shipper, MD;  Location: [facility]  ENDOSCOPY;  Service: Endoscopy;  Laterality: N/A;  . EYE SURGERY  2012   both cataracts, Lasik  . LEFT KNEE ARTHROSCOPY Left 07/16/2017    [facility]   . LUMBAR FUSION     x 3  . REVERSE SHOULDER ARTHROPLASTY Left 07/04/2016   Procedure: LEFT REVERSE SHOULDER ARTHROPLASTY;  Surgeon: Netta Cedars, MD;  Location: [facility];  Service: Orthopedics;  Laterality: Left;  . TONSILLECTOMY  1946  . TOTAL KNEE ARTHROPLASTY  2003   Right  . TOTAL KNEE ARTHROPLASTY Left 09/25/2017   Procedure: LEFT TOTAL KNEE ARTHROPLASTY;  Surgeon: Sydnee Cabal, MD;  Location: [facility];  Service: Orthopedics;  Laterality: Left;    There were no vitals filed for this visit.  Subjective Assessment - 03/24/18 0852    Subjective  Would like to work on balance.  No changes, nothing new with MD visit at [facility] yesterday.    Patient is accompained by:  Family member wife    Pertinent History  anemia, bil TKR (left 09/25/17), lumbar fusion, left shoulder arthroplasty, HTN,     Limitations  Walking;Standing    Diagnostic tests  labs confirmed Myasthenia Gravis    Patient Stated Goals  get stronger, especially my neck so it will stop hurting; be able to walk without stumbling/falling; be able to  ride bike again    Currently in Pain?  No/denies    Pain Onset  1 to 4 weeks ago                            Balance Exercises - 03/24/18 0854      Balance Exercises: Standing   Standing Eyes Opened  Wide (BOA);Narrow base of support (BOS);Solid surface;Foam/compliant surface;Head turns;5 reps Head nods 5 reps with UE support at chair    Standing Eyes Closed  Wide (BOA);Narrow base of support (BOS);Solid surface;Foam/compliant surface;2 reps;10 secs    Tandem Stance  Eyes open;Intermittent upper extremity support;3 reps;10 secs    Wall Bumps  Hip    Wall Bumps-Hips  Eyes opened;Anterior/posterior;10 reps 2 sets     Tandem Gait  Forward;Retro;Upper extremity support;3 reps Along counter    Sidestepping   Upper extremity support;2 reps R and L along counter    Marching Limitations  Marching in place x 10 reps, progress to forward marching 4 reps along counter    Heel Raises Limitations  x 10 reps 2 sets    Toe Raise Limitations  x 10 reps 2 sets    Other Standing Exercises  Alternating hip kicks x 10 reps to side, x 10 reps to back with UE support at counter, cues for upright posture.  Alternating step taps to 6" step, 12" step, forward x 10 reps each, then side step taps to 6" step x 10 reps      Cues needed intermittently throughout session for upright posture.    PT Short Term Goals - 03/10/18 1426      PT SHORT TERM GOAL #1   Title  Patient will be independent with initial HEP, including able to verbalize how to regulate intensity based on his symptoms (Unless otherwise indicated, target all STGs 04/03/2018)    Time  4    Period  Weeks    Status  On-going      PT SHORT TERM GOAL #2   Title  Patient will complete 6 MWT to assist with exercise prescription.     Time  1    Period  Weeks    Status  Achieved      PT SHORT TERM GOAL #3   Title  Patient will complete FGA +/- Berg for fall risk prediction with follow-up goal to be set as appropriate    Baseline  7/17 FGA 20/30    Time  1    Period  Weeks    Status  Achieved      PT SHORT TERM GOAL #4   Title  If indicated, patient will be educated in proper use of appropriate assistive device to use to reduce fall risk.     Time  4    Period  Weeks    Status  New      PT SHORT TERM GOAL #5   Title  Patient will improve FGA to >=23 to demonstrate lesser fall risk    Time  4    Period  Weeks    Status  New        PT Long Term Goals - 03/05/18 1333      PT LONG TERM GOAL #1   Title  Patient will be independent with updated HEP and able to verbalize his plan for return to community-based exercise program. (Target all LTGs 05/03/2018)    Time  8    Period  Weeks  Status  New    Target Date  05/03/18      PT LONG TERM GOAL  #2   Title  Patient will improve 6 MWT distance by 100 ft     Time  8    Status  New      PT LONG TERM GOAL #3   Title  Patient will demonstrate reduced fall risk via achieving a minimally detectable change in score on either FGA or Berg (depending on initial test results).    Time  8    Period  Weeks    Status  New      PT LONG TERM GOAL #4   Title  Patient will be educated on role of Occupational Therapy and referral obtained from MD if appropriate and pt agreeable.     Time  8    Period  Weeks    Status  New            Plan - 03/25/18 1253    Clinical Impression Statement  Today's skilled PT session focused on balance exercises for improved SLS, dynamic balance, and balance strategies.  Pt needed intermittent UE support for most standing balance exercises and will benefit from continued balance work.  He had no reports of neck pain this visit.      Rehab Potential  Good    PT Frequency  Other (comment) 2x/week x 4 weeks; 1x/week x 4 weeks    PT Duration  8 weeks    PT Treatment/Interventions  ADLs/Self Care Home Management;Aquatic Therapy;Biofeedback;Cryotherapy;Gait training;DME Instruction;Functional mobility training;Therapeutic activities;Therapeutic exercise;Balance training;Neuromuscular re-education;Manual techniques;Orthotic Fit/Training;Patient/family education;Passive range of motion;Energy conservation    PT Next Visit Plan   ? results from trip to [place];  add balance exercises to HEP (offered to add to HEP, but pt says he already has quite a few exercises, so perhaps revisit at next session); address neck as needed    Consulted and Agree with Plan of Care  Patient       Patient will benefit from skilled therapeutic intervention in order to improve the following deficits and impairments:  Abnormal gait, Decreased activity tolerance, Decreased balance, Decreased mobility, Decreased knowledge of use of DME, Decreased knowledge of precautions, Decreased endurance,  Decreased safety awareness, Decreased strength, Postural dysfunction, Pain  Visit Diagnosis: Unsteadiness on feet  Abnormal posture     Problem List Patient Active Problem List   Diagnosis Date Noted  . Weakness of both lower extremities 01/13/2018  . Myasthenia gravis, AChR antibody positive ([place]) 01/13/2018  . Dysarthria 12/04/2017  . Dysphagia   . CVA (cerebral vascular accident) ([place]) 11/30/2017  . Angio-edema 11/26/2017  . S/P knee replacement 09/25/2017  . Knee pain, chronic 08/14/2017  . OSA on CPAP 03/05/2017  . OSA (obstructive sleep apnea) 09/30/2016  . S/P shoulder replacement, left 07/04/2016  . Preop exam for internal medicine 04/16/2016  . Claudication ([place]) 09/14/2015  . Pain in the chest   . Abnormal stress test 08/14/2015  . Dyspnea 07/23/2015  . Paresthesia 10/25/2014  . Weakness 04/13/2014  . Heme positive stool 02/22/2014  . Benign prostatic hyperplasia 02/22/2014  . B12 deficiency 02/21/2014  . Anemia, iron deficiency 02/21/2014  . Leg weakness, bilateral 01/24/2014  . Erectile dysfunction 01/24/2014  . Edema 01/24/2014  . URI, acute 08/31/2013  . Cellulitis of arm, right 07/13/2013  . Grief 07/13/2013  . Well adult exam 03/01/2012  . CARPAL TUNNEL SYNDROME 11/04/2010  . BENIGN PROSTATIC HYPERTROPHY, WITH URINARY OBSTRUCTION 11/04/2010  . WRIST  PAIN 11/04/2010  . LEG PAIN 07/16/2010  . COUGH 05/20/2010  . TOBACCO USE, QUIT 07/04/2009  . WARTS, VIRAL, UNSPECIFIED 11/06/2008  . INSOMNIA, PERSISTENT 01/14/2008  . Essential hypertension 01/14/2008  . RHINITIS 01/14/2008  . ABDOMINAL PAIN 01/14/2008  . TREMOR 09/07/2007  . Actinic keratosis 08/12/2007  . Neoplasm of uncertain behavior of skin 07/06/2007  . LOW BACK PAIN 07/06/2007  . Dyslipidemia 06/05/2007  . Coronary atherosclerosis 06/05/2007  . Osteoarthritis 06/05/2007    Dovid Bartko W. 03/25/2018, 12:57 PM  Frazier Butt., PT  [facility] [address] [place], [place], [address] Phone: [phone]   Fax:  [phone]  Name: WALLIS VANCOTT MRN: [id number] Date of Birth: 08-23-1941

## 2018-03-26 ENCOUNTER — Ambulatory Visit: Payer: Medicare Other | Attending: Internal Medicine | Admitting: Physical Therapy

## 2018-03-26 ENCOUNTER — Encounter: Payer: Self-pay | Admitting: Physical Therapy

## 2018-03-26 DIAGNOSIS — R293 Abnormal posture: Secondary | ICD-10-CM | POA: Diagnosis not present

## 2018-03-26 DIAGNOSIS — R2681 Unsteadiness on feet: Secondary | ICD-10-CM | POA: Diagnosis not present

## 2018-03-26 DIAGNOSIS — M6281 Muscle weakness (generalized): Secondary | ICD-10-CM | POA: Diagnosis not present

## 2018-03-26 DIAGNOSIS — R29898 Other symptoms and signs involving the musculoskeletal system: Secondary | ICD-10-CM | POA: Diagnosis not present

## 2018-03-26 NOTE — Patient Instructions (Signed)
Access Code: [email or web address]  URL: [email or web address]  Date: 03/26/2018  Prepared by: Barry Brunner   Added today: Exercises  Seated Neck Sidebending ROM - 3 reps - 1 sets - 30 hold - 2x daily - 7x weekly

## 2018-03-26 NOTE — Therapy (Signed)
[facility] [address] [place], [place], [address] Phone: [phone]   Fax:  [phone]  Physical Therapy Treatment  Patient Details  Name: [name] MRN: [id number] Date of Birth: 06/20/1941 Referring Provider: Cassandria Anger, MD   Encounter Date: 03/26/2018  PT End of Session - 03/26/18 1553    Visit Number  6    Number of Visits  13    Date for PT Re-Evaluation  05/03/18    Authorization Type  [employer]HC Medicare    Authorization Time Period  03/04/18 to 06/02/18    PT Start Time  1447    PT Stop Time  1532    PT Time Calculation (min)  45 min    Activity Tolerance  Patient tolerated treatment well;No increased pain reports neck fatigue at end of session, but no pain    Behavior During Therapy  [facility] for tasks assessed/performed       Past Medical History:  Diagnosis Date  . Anemia   . Blood transfusion without reported diagnosis 02/23/2014   3 units for iron deficiency anemia. as a baby - whenhe had pneumonia  . BPH (benign prostatic hyperplasia)   . CTS (carpal tunnel syndrome)    better  . Dyspnea   . GERD (gastroesophageal reflux disease)   . Hyperlipidemia   . Hypertension    "THEY DIAGNOSED ME YEARS AGO BUT LATELY IVE ACTUALLY HAD LOW BLOOD PRESSURES "   . Insomnia   . LBP (low back pain)   . Osteoarthritis   . Pneumonia    as a baby  . Sleep apnea    no cpap    Past Surgical History:  Procedure Laterality Date  . CARDIAC CATHETERIZATION N/A 08/15/2015   Procedure: Left Heart Cath and Coronary Angiography;  Surgeon: Lorretta Harp, MD;  Location: [place] [facility];  Service: Cardiovascular;  Laterality: N/A;  . CARPAL TUNNEL RELEASE Bilateral 1996, 2004   Dr Lenoard Aden thumb surgery.  more than once  . COLONOSCOPY  2005, 2012   diverticulosis.   Marland Kitchen ESOPHAGOGASTRODUODENOSCOPY N/A 02/23/2014   Procedure: ESOPHAGOGASTRODUODENOSCOPY (EGD);  Surgeon: Irene Shipper, MD;  Location: [facility]  ENDOSCOPY;  Service: Endoscopy;  Laterality: N/A;  . EYE SURGERY  2012   both cataracts, Lasik  . LEFT KNEE ARTHROSCOPY Left 07/16/2017    [facility]T [facility]   . LUMBAR FUSION     x 3  . REVERSE SHOULDER ARTHROPLASTY Left 07/04/2016   Procedure: LEFT REVERSE SHOULDER ARTHROPLASTY;  Surgeon: Netta Cedars, MD;  Location: [facility];  Service: Orthopedics;  Laterality: Left;  . TONSILLECTOMY  1946  . TOTAL KNEE ARTHROPLASTY  2003   Right  . TOTAL KNEE ARTHROPLASTY Left 09/25/2017   Procedure: LEFT TOTAL KNEE ARTHROPLASTY;  Surgeon: Sydnee Cabal, MD;  [facility];  Service: Orthopedics;  Laterality: Left;    There were no vitals filed for this visit.  Subjective Assessment - 03/26/18 1450    Subjective  Rode his bike around the block the other day and it went fine. Will be on vacation next week with family. Next week will be admitted to [facility] for plasmapheresis.     Patient is accompained by:  Family member wife    Pertinent History  anemia, bil TKR (left 09/25/17), lumbar fusion, left shoulder arthroplasty, HTN,     Limitations  Walking;Standing    Diagnostic tests  labs confirmed Myasthenia Gravis    Patient Stated Goals  get stronger, especially my neck so  it will stop hurting; be able to walk without stumbling/falling; be able to ride bike again    Currently in Pain?  Yes    Pain Score  5     Pain Location  Neck    Pain Orientation  Posterior    Pain Descriptors / Indicators  Aching    Pain Type  Acute pain    Pain Onset  1 to 4 weeks ago    Pain Frequency  Intermittent    Aggravating Factors   prolonged time upright    Pain Relieving Factors  lying down; stretching        Treatment- Gait/balance training with dual task--ambulating while tossing and catching a ball and then also added naming objects going thru alphabet A to Z. Patient did not lose his balance, slight drift to his left x 2, however his cognitive task was all over the place. Would progress through alphabet appropriately and  then begin to skip around. Reinstructed and again progressed correctly for 4 letters and began to skip all over the alphabet.   Balance-corner exercises on blue airex: EO feet together 30 sec, EC <5 sec; feet apart EC x 30 sec; EC head turns and up/down; pt with increased sway with head movements, however did not need external support to correct/regain balance.  Exercise-supine neck rotation, sidebending with improvements in ROM noted; progressed to sitting and added sidebending with rotation to "sniff his underarm." 30 sec x2 each side                       PT Education - 03/26/18 1551    Education Details  continue to work on neck stretches/ROM with additional stretch added    Person(s) Educated  Patient    Methods  Explanation;Demonstration;Verbal cues;Handout    Comprehension  Verbalized understanding;Returned demonstration;Verbal cues required;Need further instruction       PT Short Term Goals - 03/10/18 1426      PT SHORT TERM GOAL #1   Title  Patient will be independent with initial HEP, including able to verbalize how to regulate intensity based on his symptoms (Unless otherwise indicated, target all STGs 04/03/2018)    Time  4    Period  Weeks    Status  On-going      PT SHORT TERM GOAL #2   Title  Patient will complete 6 MWT to assist with exercise prescription.     Time  1    Period  Weeks    Status  Achieved      PT SHORT TERM GOAL #3   Title  Patient will complete FGA +/- Berg for fall risk prediction with follow-up goal to be set as appropriate    Baseline  7/17 FGA 20/30    Time  1    Period  Weeks    Status  Achieved      PT SHORT TERM GOAL #4   Title  If indicated, patient will be educated in proper use of appropriate assistive device to use to reduce fall risk.     Time  4    Period  Weeks    Status  New      PT SHORT TERM GOAL #5   Title  Patient will improve FGA to >=23 to demonstrate lesser fall risk    Time  4    Period  Weeks     Status  New        PT Long Term Goals -  03/05/18 1333      PT LONG TERM GOAL #1   Title  Patient will be independent with updated HEP and able to verbalize his plan for return to community-based exercise program. (Target all LTGs 05/03/2018)    Time  8    Period  Weeks    Status  New    Target Date  05/03/18      PT LONG TERM GOAL #2   Title  Patient will improve 6 MWT distance by 100 ft     Time  8    Status  New      PT LONG TERM GOAL #3   Title  Patient will demonstrate reduced fall risk via achieving a minimally detectable change in score on either FGA or Berg (depending on initial test results).    Time  8    Period  Weeks    Status  New      PT LONG TERM GOAL #4   Title  Patient will be educated on role of Occupational Therapy and referral obtained from MD if appropriate and pt agreeable.     Time  8    Period  Weeks    Status  New            Plan - 03/26/18 1557    Clinical Impression Statement  Session included balance training in combination with walking and dual task situations, which patient did well maintaining balance however at reduced velocity and difficulty attending to cognitive task. Additional balance training on compliant surface with patient able to recover balance ~75% of the time before he touches the walls. Finally addressed his neck pain, which was again increased. By end of session he reported no pain. Patient will be away from PT for next 2 weeks (vacation and then admission to [facility] for plasmapheresis). He was instructed to call and cancel if he has any restricitions on returning to therapy.     Rehab Potential  Good    PT Frequency  Other (comment) 2x/week x 4 weeks; 1x/week x 4 weeks    PT Duration  8 weeks    PT Treatment/Interventions  ADLs/Self Care Home Management;Aquatic Therapy;Biofeedback;Cryotherapy;Gait training;DME Instruction;Functional mobility training;Therapeutic activities;Therapeutic exercise;Balance training;Neuromuscular  re-education;Manual techniques;Orthotic Fit/Training;Patient/family education;Passive range of motion;Energy conservation    PT Next Visit Plan   check STGs/reassess after plasmapheresis at [facility]; review his current HEP (was to do leg exercises 3 days/week and neck/shoulder exercises on the alternate 3 days with cervical stretching every day) ?weed out anything too easy and add balance exercises; address neck as needed    Consulted and Agree with Plan of Care  Patient       Patient will benefit from skilled therapeutic intervention in order to improve the following deficits and impairments:  Abnormal gait, Decreased activity tolerance, Decreased balance, Decreased mobility, Decreased knowledge of use of DME, Decreased knowledge of precautions, Decreased endurance, Decreased safety awareness, Decreased strength, Postural dysfunction, Pain  Visit Diagnosis: Unsteadiness on feet  Muscle weakness (generalized)  Other symptoms and signs involving the musculoskeletal system     Problem List Patient Active Problem List   Diagnosis Date Noted  . Weakness of both lower extremities 01/13/2018  . Myasthenia gravis, AChR antibody positive ([place]) 01/13/2018  . Dysarthria 12/04/2017  . Dysphagia   . CVA (cerebral vascular accident) ([place]) 11/30/2017  . Angio-edema 11/26/2017  . S/P knee replacement 09/25/2017  . Knee pain, chronic 08/14/2017  . OSA on CPAP 03/05/2017  . OSA (obstructive sleep apnea)  09/30/2016  . S/P shoulder replacement, left 07/04/2016  . Preop exam for internal medicine 04/16/2016  . Claudication ([place]) 09/14/2015  . Pain in the chest   . Abnormal stress test 08/14/2015  . Dyspnea 07/23/2015  . Paresthesia 10/25/2014  . Weakness 04/13/2014  . Heme positive stool 02/22/2014  . Benign prostatic hyperplasia 02/22/2014  . B12 deficiency 02/21/2014  . Anemia, iron deficiency 02/21/2014  . Leg weakness, bilateral 01/24/2014  . Erectile dysfunction 01/24/2014  . Edema  01/24/2014  . URI, acute 08/31/2013  . Cellulitis of arm, right 07/13/2013  . Grief 07/13/2013  . Well adult exam 03/01/2012  . CARPAL TUNNEL SYNDROME 11/04/2010  . BENIGN PROSTATIC HYPERTROPHY, WITH URINARY OBSTRUCTION 11/04/2010  . WRIST PAIN 11/04/2010  . LEG PAIN 07/16/2010  . COUGH 05/20/2010  . TOBACCO USE, QUIT 07/04/2009  . WARTS, VIRAL, UNSPECIFIED 11/06/2008  . INSOMNIA, PERSISTENT 01/14/2008  . Essential hypertension 01/14/2008  . RHINITIS 01/14/2008  . ABDOMINAL PAIN 01/14/2008  . TREMOR 09/07/2007  . Actinic keratosis 08/12/2007  . Neoplasm of uncertain behavior of skin 07/06/2007  . LOW BACK PAIN 07/06/2007  . Dyslipidemia 06/05/2007  . Coronary atherosclerosis 06/05/2007  . Osteoarthritis 06/05/2007    Rexanne Mano, PT 03/26/2018, 4:07 PM  [facility] [address] [place], [place], [address] Phone: [phone]   Fax:  [phone]  Name: [name] MRN: [id number] Date of Birth: September 26, 1940

## 2018-04-05 ENCOUNTER — Ambulatory Visit: Payer: Medicare Other | Admitting: Physical Therapy

## 2018-04-05 DIAGNOSIS — G7000 Myasthenia gravis without (acute) exacerbation: Secondary | ICD-10-CM | POA: Diagnosis not present

## 2018-04-07 ENCOUNTER — Ambulatory Visit: Payer: Medicare Other | Admitting: Physical Therapy

## 2018-04-07 DIAGNOSIS — G7000 Myasthenia gravis without (acute) exacerbation: Secondary | ICD-10-CM | POA: Diagnosis not present

## 2018-04-09 ENCOUNTER — Ambulatory Visit: Payer: Medicare Other | Admitting: Internal Medicine

## 2018-04-09 DIAGNOSIS — R1310 Dysphagia, unspecified: Secondary | ICD-10-CM | POA: Diagnosis not present

## 2018-04-09 DIAGNOSIS — G7000 Myasthenia gravis without (acute) exacerbation: Secondary | ICD-10-CM | POA: Diagnosis not present

## 2018-04-09 DIAGNOSIS — Z79899 Other long term (current) drug therapy: Secondary | ICD-10-CM | POA: Diagnosis not present

## 2018-04-12 ENCOUNTER — Encounter: Payer: Self-pay | Admitting: Neurology

## 2018-04-12 ENCOUNTER — Ambulatory Visit: Payer: Medicare Other | Admitting: Physical Therapy

## 2018-04-12 ENCOUNTER — Ambulatory Visit: Payer: Medicare Other | Admitting: Neurology

## 2018-04-12 ENCOUNTER — Encounter: Payer: Self-pay | Admitting: Physical Therapy

## 2018-04-12 VITALS — BP 115/58 | HR 77 | Ht 72.0 in | Wt 223.0 lb

## 2018-04-12 DIAGNOSIS — M6281 Muscle weakness (generalized): Secondary | ICD-10-CM | POA: Diagnosis not present

## 2018-04-12 DIAGNOSIS — Z9289 Personal history of other medical treatment: Secondary | ICD-10-CM

## 2018-04-12 DIAGNOSIS — Z961 Presence of intraocular lens: Secondary | ICD-10-CM | POA: Diagnosis not present

## 2018-04-12 DIAGNOSIS — H04123 Dry eye syndrome of bilateral lacrimal glands: Secondary | ICD-10-CM | POA: Diagnosis not present

## 2018-04-12 DIAGNOSIS — R293 Abnormal posture: Secondary | ICD-10-CM

## 2018-04-12 DIAGNOSIS — G7001 Myasthenia gravis with (acute) exacerbation: Secondary | ICD-10-CM

## 2018-04-12 DIAGNOSIS — G4733 Obstructive sleep apnea (adult) (pediatric): Secondary | ICD-10-CM

## 2018-04-12 DIAGNOSIS — G3184 Mild cognitive impairment, so stated: Secondary | ICD-10-CM

## 2018-04-12 DIAGNOSIS — R2681 Unsteadiness on feet: Secondary | ICD-10-CM | POA: Diagnosis not present

## 2018-04-12 DIAGNOSIS — R29898 Other symptoms and signs involving the musculoskeletal system: Secondary | ICD-10-CM | POA: Diagnosis not present

## 2018-04-12 DIAGNOSIS — B301 Conjunctivitis due to adenovirus: Secondary | ICD-10-CM | POA: Diagnosis not present

## 2018-04-12 MED ORDER — PYRIDOSTIGMINE BROMIDE 60 MG PO TABS: ORAL_TABLET | ORAL | 3 refills | Status: DC

## 2018-04-12 MED ORDER — MYCOPHENOLATE MOFETIL 250 MG PO CAPS: 250.0000 mg | ORAL_CAPSULE | Freq: Two times a day (BID) | ORAL | 5 refills | Status: DC

## 2018-04-12 NOTE — Progress Notes (Signed)
SLEEP MEDICINE CLINIC   Provider:  Larey Seat, M D  Referring Provider: Cassandria Anger, MD Primary Care Physician:  Cassandria Anger, MD  Chief Complaint  Patient presents with  . Follow-up    pt with wife, rm 6. they have went to [facility] and is getting plasmaphoresis. pt was becoming worse despite taking the medications. saw Dr Earle Gell on July 30. last monday he started plasmaphoresis and there has been much improvement. he finished his neuro rehab therapy today.     04-12-2018, MG patient treated with plasmapheresis , 3 treatments last week, one tomorrow , and one on Thursday. During my vacation he developed a head drop, orthopedist gave him a collar, he has swallowing difficulties, and was just given advise over the phone from [place] to increase Mestinon to 4 a day but no other follow up was offered, and the wife called [facility] - saw Dr. Earle Gell and now is on the aforementioned therapy.  He is supposed to start on prednisone and higher dose of Cellcept. He is walking better, has better control of his head and gait. He may have once a month follow up plasmaphoresis. He has a follow up with me in November.   His CPAP use has been sporadic and he has to sleep on his back.  He will try a FFM by dream wear in large.  MMSE 24/ 30 - MCI or early dementia ? He is alert , oriented, we tried MOCA- but he failed the trail making test has short term memory.      02-05-2018, Mestinon has helped to cure his constipation, he has no longer ptosis, has been less weak. He is swallowing is improved. Mestinon to be taken before meals tid. Top lip feels swollen He lost is son Legrand Como at age 40 to the symptoms of Crohn's disease, next Saturday grave side memorial. He has noted more memory problems.  His gait is still impaired,but it seems not to be myasthenia, the left knee is swollen and stiff, knee replacement in feb 2019. He walked really well when he took prednisone. Dr .Theda Sers will follow in July. He  is deeply affected. Grieving.  MOCA 22-24. MMSE 25/30.CT chest - no evidence of thymoma. I am hesitant to start prednisone, would rather use cellcept.    Interval history for Mr. Fruin from 01-13-2018 ,post hospitalization.  The patient had knee replacement 09-25-2017 , had acute blood loss upon chronic anemia -needed iron infusion and received one on 2-16-,  and upon the second infusion 11-19-2017 developed speech and swallowing difficulties. He was in discomfort. The patient was admitted on 8 April through the [facility] long ED was transferred to [facility] and admitted.  Per neurology Donaldson hospitalist the patient was introduced as a 77 year old Caucasian married male with a history significant for coronary artery disease, chronic anemia, iron infusions every 3 months.  The patient presented to the ED with dysarthria dysphagia and dysarthria which started as early as 29th  March after an iron insfusion. Further iron infusions were blocked from there on given the side effects.  The patient was evaluated by ear nose and throat with Dr. Erik Obey who had recommended a CT of the neck and speech therapy as well as a neurology consult.  2D echo showed an ejection fraction of 60% carotid Dopplers were open there was some intracranial coronary artery stenosis bilaterally but not considered significant, speech therapy evaluation observed no oral issues but aspiration danger.  He was made n.p.o. A  chest x-ray and 2 views showed normal heart size, a chronic elevation of the left hemidiaphragm.  White blood cell count on 10 April was 10.2 hemoglobin was 12.3 on the lower side hematocrit was normal at 39.4, glucose 152, triglycerides were elevated TSH was low, hemoglobin A1c was 4.6.  Low.  CT of the head from 8 April moderate to severe chronic small vessel disease no acute stroke or bleed.   MRI of the brain followed on 10 April with MRA.  No stenosis no occlusion even the distal branches of the intracranial  vessels were well perfused and symmetrically so.  Dominant right vertebral artery.  No large vessel occlusion.  Dr Alain Marion  has seen him for ataxia and weakness, tongue weakness. 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 Progression from there. He underwent lower back treatment, no success, knee replacement did not to make a big difference in gait and exercise tolerance  Both knees feel disconnected. Alien legs.  Dr. Alain Marion then stand for exit to clean receptor antibodies found the binding antibody to be 70% and the blood levels at 6.9 far above normal.  It is very likely that we are dealing with a geriatric form of myasthenia gravis, and for this reason I will order a chest CT to evaluate him for a thymus gland growth I also asked him not to take any anticholinergic medication which includes antihistamine such as Benadryl, and bladder toning agents such as Detrol.       Interval history from 07 September 2017.  I have the pleasure of meeting with Mr. [name] today who is here for a compliance visit on CPAP.  The patient is highly compliant with CPAP use, 93% equals 28 out of 30 days, however 7 days for just below the 4-hour mark. Average use of time is 5 hours and 22 minutes each night, he is using an AutoSet between 7 and 15 cm water with 3 cm expiratory pressure relief.  The 95th percentile pressure is 13.2 the patient still has 4.9 residual obstructive apneas and very infrequent central apneas.  His overall AHI was 6.2.  I would like to increase the maximum pressure to 16 cm water.  He wakes with a very dry mouth using a nasal mask in large size, a [employer]  Wisp in XL. His mouth will drop open.  He has biotin mouth wash, and a chin strap. Still,  much improved over the last visit results. He is much less sleepy, more active and can watch TV without dozing of now.    I'm seeing Mr. Bardales today on 03/05/2017 in a revisit. CDHe has undergone  a split-night titration study on 11/17/2016 and was re- diagnosed with obstructive sleep apnea. He had mostly shallow breathing spells, all obstructive in nature, with an index of 33.3 per hour of sleep, during REM sleep his AHI was 56.3 and in supine sleeps 32.8/hr. He also describes arousals out of dream sleep sometimes with a hint of confusion. The CPAP titration did make an impact but the patient only slept 12 minutes at the 12 cm water pressure that the front most beneficial. He produced a lot of apneas in spite of being on CPAP at pressures just below. For this reason I had placed him on an auto titrating between 7 and 15 cm water with 3 cm expiratory pressure relief. The patient returns today with high pressure leaks and a high number of  residual apneas at 13.9 per hour.  We have to me today to find a way to reduce his apnea to under 5.0 / hr. This seems to be a mask fit issues. The patient has a lot of air leaks and he describes discomfort from either tightening the mask too much or from having sounds produced by the air leaking. He remains fatigued with it fatigue severity score 53 and his Epworth score is elevated at 13 points. He is not depressed according to his geriatric depression scale at 2 points.   HPI: GILBERT MANOLIS is a 77 y.o. male , seen here as a referral  from Dr. Alain Marion for a sleep consultation. [name] is a 77 year old, right handed, caucasian married male patient, with a history of OSA. His original sleep study was performed at [facility] on [facility] in 2000  and revealed " severe" Obstructive Sleep Apnea. He has not been able To use CPAP, but it was not that he could not tolerate the CPAP- the durable medical equipment company furnished machine went out of business was in a month or so after his machine was issued  and he could never get supplies again. Their office was on [address] he recalls. 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 He is surprised to learn that there are nasal pillows, nasal masks etc. and how small and quiet today CPAP machines. I also explained that many CPAP machines can be used as BiPAP as well.  [name] past medical history includes spot related injuries making it necessary to undergo a total knee replacement in the year 2002, he had 3 lumbar fusions, a tonsillectomy in childhood, he has been diagnosed in the past with iron deficiency anemia, has been treated for chronic pain with narcotic medications. 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 Chief complaint according to patient : " I snore, I stop breathing, my wife is concerned about my erratic breathing"  Sleep habits are as follows:  He usually goes to bed to watch TV news -around 11.00 PM . And it will take him usually 15-30 minutes but not longer to go to sleep, and usually falls asleep on his side. He does resume a supine position later during the night. He does snore and his wife reports that breathing is worse when supine position. He sleeps on only one pillow but elevates the head of bed with a wedge. His bedroom is described as cool, quiet and dark. Nocturia 3 or 4 at night. He wakes with hand and leg pain, cramping.  He rises at 8 AM, and feels " OK ", but craves more sleep. He wakes without headaches, but feels stiff.    Sleep medical history and family sleep history:  Tonsillectomy in childhood. Social history: ex smoker , since 61, ETOH ,  Wine once a month , caffeine : coffee 4-6  cups, iced tea 1 glas at dinner, Soda - rarely. No shift work history, worked as a Dealer.   Review of Systems: Out of a complete 14 system review, the patient complains of only the following symptoms, and all other reviewed systems are negative.  On mestinon   Epworth score/, Fatigue severity score / depression  score /  Social History   Socioeconomic History  . Marital status: Married  Spouse name: Not on file  . Number of children: Not on file  . Years of education: Not on file  . Highest education level: Not on file  Occupational History  . Occupation: Retired    Fish farm manager: RETIRED    CommentDatabase administrator  Social Needs  . Financial resource strain: Not on file  . Food insecurity:    Worry: Not on file    Inability: Not on file  . Transportation needs:    Medical: Not on file    Non-medical: Not on file  Tobacco Use  . Smoking status: Former Smoker    Last attempt to quit: 01/08/1987    Years since quitting: 31.2  . Smokeless tobacco: Never Used  Substance and Sexual Activity  . Alcohol use: Yes    Alcohol/week: 0.0 standard drinks    Comment: rare  . Drug use: No  . Sexual activity: Not Currently  Lifestyle  . Physical activity:    Days per week: Not on file    Minutes per session: Not on file  . Stress: Not on file  Relationships  . Social connections:    Talks on phone: Not on file    Gets together: Not on file    Attends religious service: Not on file    Active member of club or organization: Not on file    Attends meetings of clubs or organizations: Not on file    Relationship status: Not on file  . Intimate partner violence:    Fear of current or ex partner: Not on file    Emotionally abused: Not on file    Physically abused: Not on file    Forced sexual activity: Not on file  Other Topics Concern  . Not on file  Social History Narrative   He lives with wife in a 2 story home.  Has 2 children.   Retired Dealer.   Highest level of education:  High school   Regular Exercise -  YES    Family History  Problem Relation Age of Onset  . Aneurysm Father        AAA  . Heart disease Father 2       CHF  . Alzheimer's disease Father   . Cancer Brother   . Alzheimer's disease Brother        dementia  . Stomach cancer Brother   . Cancer Mother 14       breast  cancer  . Heart disease Mother 56       MI  . Autoimmune disease Son   . CAD Brother   . Colon cancer Neg Hx   . Esophageal cancer Neg Hx   . Pancreatic cancer Neg Hx   . Prostate cancer Neg Hx   . Rectal cancer Neg Hx     Past Medical History:  Diagnosis Date  . Anemia   . Blood transfusion without reported diagnosis 02/23/2014   3 units for iron deficiency anemia. as a baby - whenhe had pneumonia  . BPH (benign prostatic hyperplasia)   . CTS (carpal tunnel syndrome)    better  . Dyspnea   . GERD (gastroesophageal reflux disease)   . Hyperlipidemia   . Hypertension    "THEY DIAGNOSED ME YEARS AGO BUT LATELY IVE ACTUALLY HAD LOW BLOOD PRESSURES "   . Insomnia   . LBP (low back pain)   . Osteoarthritis   . Pneumonia    as a baby  . Sleep apnea    no  cpap    Past Surgical History:  Procedure Laterality Date  . CARDIAC CATHETERIZATION N/A 08/15/2015   Procedure: Left Heart Cath and Coronary Angiography;  Surgeon: Lorretta Harp, MD;  Location: [place] [facility];  Service: Cardiovascular;  Laterality: N/A;  . CARPAL TUNNEL RELEASE Bilateral 1996, 2004   Dr Lenoard Aden thumb surgery.  more than once  . COLONOSCOPY  2005, 2012   diverticulosis.   Marland Kitchen ESOPHAGOGASTRODUODENOSCOPY N/A 02/23/2014   Procedure: ESOPHAGOGASTRODUODENOSCOPY (EGD);  Surgeon: Irene Shipper, MD;  Location: [facility] ENDOSCOPY;  Service: Endoscopy;  Laterality: N/A;  . EYE SURGERY  2012   both cataracts, Lasik  . LEFT KNEE ARTHROSCOPY Left 07/16/2017    [facility]   . LUMBAR FUSION     x 3  . REVERSE SHOULDER ARTHROPLASTY Left 07/04/2016   Procedure: LEFT REVERSE SHOULDER ARTHROPLASTY;  Surgeon: Netta Cedars, MD;  Location: [place];  Service: Orthopedics;  Laterality: Left;  . TONSILLECTOMY  1946  . TOTAL KNEE ARTHROPLASTY  2003   Right  . TOTAL KNEE ARTHROPLASTY Left 09/25/2017   Procedure: LEFT TOTAL KNEE ARTHROPLASTY;  Surgeon: Sydnee Cabal, MD;  Location: [facility];  Service: Orthopedics;  Laterality: Left;     Current Outpatient Medications  Medication Sig Dispense Refill  . aspirin EC 81 MG tablet Take 1 tablet (81 mg total) by mouth daily. 100 tablet 3  . Cholecalciferol (VITAMIN D3) 2000 units TABS Take 2,000 Units by mouth at bedtime.    . Coenzyme Q10 (CO Q-10 PO) Take 1 tablet by mouth at bedtime.    . Cyanocobalamin (VITAMIN B-12) 1000 MCG SUBL Place 1 tablet (1,000 mcg total) under the tongue daily. (Patient taking differently: Place 1,000 mcg under the tongue at bedtime. ) 100 tablet 3  . DULoxetine (CYMBALTA) 60 MG capsule TAKE 1 CAPSULE BY MOUTH  DAILY 90 capsule 1  . finasteride (PROSCAR) 5 MG tablet TAKE 1 TABLET BY MOUTH  DAILY (Patient taking differently: TAKE 1 TABLET (5 MG) BY MOUTH  DAILY AT NIGHT) 90 tablet 3  . folic acid (V-R FOLIC ACID) 518 MCG tablet Take 1 tablet (400 mcg total) by mouth daily. (Patient taking differently: Take 400 mcg by mouth at bedtime. ) 90 tablet 3  . furosemide (LASIX) 20 MG tablet TAKE 1 TO 2 TABLETS BY  MOUTH DAILY AS NEEDED FOR  EDEMA (Patient taking differently: TAKE 1 TABLET (20 MG) BY  MOUTH DAILY IN THE EVENING) 180 tablet 3  . mycophenolate (CELLCEPT) 250 MG capsule Take 1 capsule (250 mg total) by mouth 2 (two) times daily. 60 capsule 1  . Polyethyl Glycol-Propyl Glycol (LUBRICANT EYE DROPS) 0.4-0.3 % SOLN Place 1-2 drops into both eyes daily.    Marland Kitchen pyridostigmine (MESTINON) 60 MG tablet Take 1-2 tablets (60-120 mg total) by mouth 3 (three) times daily. 360 tablet 3  . ranitidine (ZANTAC) 150 MG tablet Take 1 tablet (150 mg total) by mouth 2 (two) times daily. (Patient taking differently: Take 150 mg by mouth at bedtime. ) 180 tablet 3  . tamsulosin (FLOMAX) 0.4 MG CAPS capsule TAKE 2 CAPSULES BY MOUTH  DAILY 180 capsule 3  . vitamin C (ASCORBIC ACID) 500 MG tablet Take 1 tablet (500 mg total) by mouth daily. (Patient taking differently: Take 500 mg by mouth at bedtime. ) 100 tablet 1   No current facility-administered medications for this  visit.     Allergies as of 04/12/2018  . (No Known Allergies)    Vitals: BP (!) 115/58  Pulse 77   Ht 6' (1.829 m)   Wt 223 lb (101.2 kg)   BMI 30.24 kg/m    Last Weight:  Wt Readings from Last 1 Encounters:  04/12/18 223 lb (101.2 kg)   SLH:TDSK mass index is 30.24 kg/m.     Last Height:   Ht Readings from Last 1 Encounters:  04/12/18 6' (1.829 m)    Physical exam:  General: The patient is awake, alert and is well groomed. Head: Normocephalic, atraumatic.  Neck is supple. Mallampati 3-4  ,  neck circumference:19. Nasal airflow patent, TMJ click evident . Retrognathia is not seen.  Cardiovascular:  Regular rate and rhythm, without  murmurs or carotid bruit, and without distended neck veins. Respiratory: Lungs are clear to auscultation. Skin:  He has a bronzed skin color-  And looks healthier.  Trunk: BMI is 33  Neurologic exam : The patient is awake and alert. Speech is fluent, with hoarseness, dysphonia. Mood and affect are depressed.  Cranial nerves: Pupils are equal , EOM without nystagmus. Left sided ptosis.  Hearing to finger rub intact. Facial sensation intact to fine touch. Facial motor strength: facial expression is symmetric, his tongue and uvula move midline. He can whistle.  upper lip on the left curls.    Shoulder shrug was weaker on the left.The deep tendon reflexes are preserved , and the patellar reflexes actually slightly more brisk on the left than right,   watch the patient walk and he did not show a lot of global rotation, he turned with 4 steps his gait was not wide-based but his step with was reduced, he takes smaller steps very careful deliberately looking at his feet as he walks, he did not provide any evidence of parkinsonism there was no shuffling, no tremor my impression was more that of a spinal stenosis as a possible gait disorder.   Given that the patient has rather focal hip abduction hip flexion and abduction weakness as well as having  overcome weakness of the tongue and lips   I spent more than 40 minutes of face to face time with the patient. Greater than 50% of time was spent in counseling and coordination of care. We have discussed the diagnosis and differential and I answered the patient's questions.    1)He started mestinon tid, I encouraged to take it 4 times daily.  Watch for drooling or diarrhea.   [facility] [address]  2)He takes cellcept - bid,   3)plasmapheresis at [facility].    Assessment:     No Detrol, etc, no benadryl, etc.  No antihistamines.  Rv in 4 month with MOCA- MMSE with me or NP.    Asencion Partridge Stacyann Mcconaughy MD  04/12/2018   CC: Cassandria Anger, Md [address] [place], [place] [address]

## 2018-04-12 NOTE — Therapy (Signed)
[facility] [address] [place], [place], [address] Phone: [phone]   Fax:  [phone]  Physical Therapy Treatment and Discharge Summary  Patient Details  Name: [name] MRN: [id number] Date of Birth: [birth year] Referring Provider: Cassandria Anger, MD   Encounter Date: 04/12/2018  PT End of Session - 04/12/18 1354    Visit Number  7    Number of Visits  13    Date for PT Re-Evaluation  05/03/18    Authorization Type  [employer] Medicare    Authorization Time Period  03/04/18 to 06/02/18    PT Start Time  1315    PT Stop Time  1348    PT Time Calculation (min)  33 min    Activity Tolerance  Patient tolerated treatment well;No increased pain    Behavior During Therapy  WFL for tasks assessed/performed       Past Medical History:  Diagnosis Date  . Anemia   . Blood transfusion without reported diagnosis 02/23/2014   3 units for iron deficiency anemia. as a baby - whenhe had pneumonia  . BPH (benign prostatic hyperplasia)   . CTS (carpal tunnel syndrome)    better  . Dyspnea   . GERD (gastroesophageal reflux disease)   . Hyperlipidemia   . Hypertension    "THEY DIAGNOSED ME YEARS AGO BUT LATELY IVE ACTUALLY HAD LOW BLOOD PRESSURES "   . Insomnia   . LBP (low back pain)   . Osteoarthritis   . Pneumonia    as a baby  . Sleep apnea    no cpap    Past Surgical History:  Procedure Laterality Date  . CARDIAC CATHETERIZATION N/A 08/15/2015   Procedure: Left Heart Cath and Coronary Angiography;  Surgeon: Lorretta Harp, MD;  Location: [facility];  Service: Cardiovascular;  Laterality: N/A;  . CARPAL TUNNEL RELEASE Bilateral 1996, 2004   Dr Lenoard Aden thumb surgery.  more than once  . COLONOSCOPY  2005, 2012   diverticulosis.   Marland Kitchen ESOPHAGOGASTRODUODENOSCOPY N/A 02/23/2014   Procedure: ESOPHAGOGASTRODUODENOSCOPY (EGD);  Surgeon: Irene Shipper, MD;  Location: [facility];  Service: Endoscopy;   Laterality: N/A;  . EYE SURGERY  2012   both cataracts, Lasik  . LEFT KNEE ARTHROSCOPY Left 07/16/2017    [facility]   . LUMBAR FUSION     x 3  . REVERSE SHOULDER ARTHROPLASTY Left 07/04/2016   Procedure: LEFT REVERSE SHOULDER ARTHROPLASTY;  Surgeon: Netta Cedars, MD;  Location: [facility];  Service: Orthopedics;  Laterality: Left;  . TONSILLECTOMY  1946  . TOTAL KNEE ARTHROPLASTY  2003   Right  . TOTAL KNEE ARTHROPLASTY Left 09/25/2017   Procedure: LEFT TOTAL KNEE ARTHROPLASTY;  Surgeon: Sydnee Cabal, MD;  Location: [facility];  Service: Orthopedics;  Laterality: Left;    There were no vitals filed for this visit.  Subjective Assessment - 04/12/18 1319    Subjective  Did well on vacation. Rode his bike and has ridden up to 5 miles. (Goal is to get back to 60 miles). Has 2 more plasmapheresis sessions at [facility]    Patient is accompained by:  Family member   wife   Pertinent History  anemia, bil TKR (left 09/25/17), lumbar fusion, left shoulder arthroplasty, HTN,     Limitations  Walking;Standing    Diagnostic tests  labs confirmed Myasthenia Gravis    Patient Stated Goals  get stronger, especially my neck so it will stop hurting; be able to walk  without stumbling/falling; be able to ride bike again    Currently in Pain?  No/denies    Pain Onset  --         [facility] PT Assessment - 04/12/18 1328      6 Minute walk- Post Test   HR (bpm)  120    02 Sat (%RA)  95 %      6 minute walk test results    Aerobic Endurance Distance Walked  950    Endurance additional comments  norm for age 45 ft      Functional Gait  Assessment   Gait Level Surface  Walks 20 ft, slow speed, abnormal gait pattern, evidence for imbalance or deviates 10-15 in outside of the 12 in walkway width. Requires more than 7 sec to ambulate 20 ft.   8.7   Change in Gait Speed  Able to smoothly change walking speed without loss of balance or gait deviation. Deviate no more than 6 in outside of the 12 in walkway width.    Gait  with Horizontal Head Turns  Performs head turns smoothly with no change in gait. Deviates no more than 6 in outside 12 in walkway width    Gait with Vertical Head Turns  Performs head turns with no change in gait. Deviates no more than 6 in outside 12 in walkway width.    Gait and Pivot Turn  Pivot turns safely within 3 sec and stops quickly with no loss of balance.    Step Over Obstacle  Is able to step over 2 stacked shoe boxes taped together (9 in total height) without changing gait speed. No evidence of imbalance.    Gait with Narrow Base of Support  Ambulates 4-7 steps.    Gait with Eyes Closed  Walks 20 ft, uses assistive device, slower speed, mild gait deviations, deviates 6-10 in outside 12 in walkway width. Ambulates 20 ft in less than 9 sec but greater than 7 sec.    Ambulating Backwards  Walks 20 ft, uses assistive device, slower speed, mild gait deviations, deviates 6-10 in outside 12 in walkway width.    Steps  Alternating feet, must use rail.    Total Score  23                   [facility] Adult PT Treatment/Exercise - 04/12/18 1351      Ambulation/Gait   Ambulation/Gait Assistance  7: Independent    Ambulation Distance (Feet)  950 Feet    Assistive device  None    Gait Pattern  Step-through pattern;Wide base of support    Ambulation Surface  Indoor    Stairs  Yes    Stairs Assistance  6: Modified independent (Device/Increase time)    Stair Management Technique  One rail Right;Alternating pattern;Forwards      Exercises   Exercises  Other Exercises    Other Exercises   Reviewed his neck exercises for his HEP; reports he conintues to do stretches, has not tried using theraband for UEs, shoulders again (previously caused pain);              PT Education - 04/12/18 1354    Education Details  results of assessments and LTGs    Person(s) Educated  Patient    Methods  Explanation    Comprehension  Verbalized understanding       PT Short Term Goals - 04/12/18  1356      PT SHORT TERM GOAL #1   Title  Patient will be independent with initial HEP, including able to verbalize how to regulate intensity based on his symptoms (Unless otherwise indicated, target all STGs 04/03/2018)    Time  4    Period  Weeks    Status  On-going      PT SHORT TERM GOAL #2   Title  Patient will complete 6 MWT to assist with exercise prescription.     Time  1    Period  Weeks    Status  Achieved      PT SHORT TERM GOAL #3   Title  Patient will complete FGA +/- Berg for fall risk prediction with follow-up goal to be set as appropriate    Baseline  7/17 FGA 20/30    Time  1    Period  Weeks    Status  Achieved      PT SHORT TERM GOAL #4   Title  If indicated, patient will be educated in proper use of appropriate assistive device to use to reduce fall risk.     Time  4    Period  Weeks    Status  Deferred      PT SHORT TERM GOAL #5   Title  Patient will improve FGA to >=23 to demonstrate lesser fall risk    Baseline  04/12/18 23/30    Time  4    Period  Weeks    Status  Achieved        PT Long Term Goals - 04/12/18 1357      PT LONG TERM GOAL #1   Title  Patient will be independent with updated HEP and able to verbalize his plan for return to community-based exercise program. (Target all LTGs 05/03/2018)    Time  8    Period  Weeks    Status  Achieved      PT LONG TERM GOAL #2   Title  Patient will improve 6 MWT distance by 100 ft     Baseline  04/12/18 discharged without achieving this goal    Time  8    Status  Not Met      PT LONG TERM GOAL #3   Title  Patient will demonstrate reduced fall risk via achieving a minimally detectable change in score on either FGA or Berg (depending on initial test results).    Time  8    Period  Weeks    Status  Achieved      PT LONG TERM GOAL #4   Title  Patient will be educated on role of Occupational Therapy and referral obtained from MD if appropriate and pt agreeable.     Time  8    Period  Weeks    Status   Deferred            Plan - 04/12/18 1358    Clinical Impression Statement  Patient returns after two weeks (vacation plus to [facility] for plasmapheresis). Overall he is feeling much better. States he is able to do everything he was able to do before, just fatigues more easily. Reports he still has some neck pain and fatigue (especially at the end of the day). He is pleased with his progress and agrees with early discharge (originally scheduled for 9/9). Despite early discharge, he met 2 of 4 LTGs, one goal was not met and one goal was deferred as no longer appropriate.     Rehab Potential  Good    PT Frequency  Other (comment)  2x/week x 4 weeks; 1x/week x 4 weeks   PT Duration  8 weeks    PT Treatment/Interventions  ADLs/Self Care Home Management;Aquatic Therapy;Biofeedback;Cryotherapy;Gait training;DME Instruction;Functional mobility training;Therapeutic activities;Therapeutic exercise;Balance training;Neuromuscular re-education;Manual techniques;Orthotic Fit/Training;Patient/family education;Passive range of motion;Energy conservation    PT Next Visit Plan  --    Consulted and Agree with Plan of Care  Patient       Patient will benefit from skilled therapeutic intervention in order to improve the following deficits and impairments:  Abnormal gait, Decreased activity tolerance, Decreased balance, Decreased mobility, Decreased knowledge of use of DME, Decreased knowledge of precautions, Decreased endurance, Decreased safety awareness, Decreased strength, Postural dysfunction, Pain  Visit Diagnosis: Muscle weakness (generalized)  Abnormal posture     Problem List Patient Active Problem List   Diagnosis Date Noted  . History of plasmapheresis 04/12/2018  . MCI (mild cognitive impairment) 04/12/2018  . Myasthenia exacerbation ([place]) 04/12/2018  . Weakness of both lower extremities 01/13/2018  . Myasthenia gravis, AChR antibody positive ([place]) 01/13/2018  . Dysarthria 12/04/2017  .  Dysphagia   . CVA (cerebral vascular accident) ([place]) 11/30/2017  . Angio-edema 11/26/2017  . S/P knee replacement 09/25/2017  . Knee pain, chronic 08/14/2017  . OSA on CPAP 03/05/2017  . OSA (obstructive sleep apnea) 09/30/2016  . S/P shoulder replacement, left 07/04/2016  . Preop exam for internal medicine 04/16/2016  . Claudication ([place]) 09/14/2015  . Pain in the chest   . Abnormal stress test 08/14/2015  . Dyspnea 07/23/2015  . Paresthesia 10/25/2014  . Weakness 04/13/2014  . Heme positive stool 02/22/2014  . Benign prostatic hyperplasia 02/22/2014  . B12 deficiency 02/21/2014  . Anemia, iron deficiency 02/21/2014  . Leg weakness, bilateral 01/24/2014  . Erectile dysfunction 01/24/2014  . Edema 01/24/2014  . URI, acute 08/31/2013  . Cellulitis of arm, right 07/13/2013  . Grief 07/13/2013  . Well adult exam 03/01/2012  . CARPAL TUNNEL SYNDROME 11/04/2010  . BENIGN PROSTATIC HYPERTROPHY, WITH URINARY OBSTRUCTION 11/04/2010  . WRIST PAIN 11/04/2010  . LEG PAIN 07/16/2010  . COUGH 05/20/2010  . TOBACCO USE, QUIT 07/04/2009  . WARTS, VIRAL, UNSPECIFIED 11/06/2008  . INSOMNIA, PERSISTENT 01/14/2008  . Essential hypertension 01/14/2008  . RHINITIS 01/14/2008  . ABDOMINAL PAIN 01/14/2008  . TREMOR 09/07/2007  . Actinic keratosis 08/12/2007  . Neoplasm of uncertain behavior of skin 07/06/2007  . LOW BACK PAIN 07/06/2007  . Dyslipidemia 06/05/2007  . Coronary atherosclerosis 06/05/2007  . Osteoarthritis 06/05/2007   PHYSICAL THERAPY DISCHARGE SUMMARY  Visits from Start of Care: 7  Current functional level related to goals / functional outcomes: See STGs and LTGs above   Remaining deficits: Neck weakness; generalized weakness from prolonged illness   Education / Equipment: HEP  Plan: Patient agrees to discharge.  Patient goals were partially met. Patient is being discharged due to being pleased with the current functional level.  ?????        Rexanne Mano,  PT 04/12/2018, 3:56 PM  [facility] [address] [place], [place], [address] Phone: [phone]   Fax:  [phone]  Name: SIGMOND PATALANO MRN: [id number] Date of Birth: 25-Nov-1940

## 2018-04-13 DIAGNOSIS — G7000 Myasthenia gravis without (acute) exacerbation: Secondary | ICD-10-CM | POA: Diagnosis not present

## 2018-04-15 DIAGNOSIS — G7000 Myasthenia gravis without (acute) exacerbation: Secondary | ICD-10-CM | POA: Diagnosis not present

## 2018-04-16 ENCOUNTER — Ambulatory Visit (INDEPENDENT_AMBULATORY_CARE_PROVIDER_SITE_OTHER): Payer: Medicare Other | Admitting: Internal Medicine

## 2018-04-16 ENCOUNTER — Encounter: Payer: Self-pay | Admitting: Internal Medicine

## 2018-04-16 DIAGNOSIS — G7000 Myasthenia gravis without (acute) exacerbation: Secondary | ICD-10-CM | POA: Diagnosis not present

## 2018-04-16 DIAGNOSIS — E876 Hypokalemia: Secondary | ICD-10-CM | POA: Insufficient documentation

## 2018-04-16 DIAGNOSIS — R600 Localized edema: Secondary | ICD-10-CM | POA: Diagnosis not present

## 2018-04-16 DIAGNOSIS — E538 Deficiency of other specified B group vitamins: Secondary | ICD-10-CM

## 2018-04-16 MED ORDER — POTASSIUM CHLORIDE ER 10 MEQ PO TBCR: 10.0000 meq | EXTENDED_RELEASE_TABLET | Freq: Two times a day (BID) | ORAL | 5 refills | Status: DC

## 2018-04-16 NOTE — Assessment & Plan Note (Signed)
controlled 

## 2018-04-16 NOTE — Patient Instructions (Signed)
Ok to stop Vitamin C, folic acid Put Furosemide on hold

## 2018-04-16 NOTE — Assessment & Plan Note (Signed)
On B12 

## 2018-04-16 NOTE — Assessment & Plan Note (Signed)
Hold Lasix KCl 10 meq bid

## 2018-04-16 NOTE — Assessment & Plan Note (Signed)
8/19 Pt had 5 plasmapheresis treatments at [facility] (Dr Earle Gell) Cristal Deer, legs are much better (03/2018). Started Prednisone 60 mg/d starting 04/15/18 discussed

## 2018-04-16 NOTE — Progress Notes (Signed)
Subjective:  Patient ID: [name], male    DOB: October 01, 1940  Age: 77 y.o. MRN: [id number]  CC: No chief complaint on file.   HPI Keigan Tafoya Knarr presents for [facility]; he had 5 plasmapheresis treatments at [facility] (Dr Earle Gell) Cristal Deer, legs are much better (03/2018). C/o low K of 3.2 on 04/13/18  Outpatient Medications Prior to Visit  Medication Sig Dispense Refill  . aspirin EC 81 MG tablet Take 1 tablet (81 mg total) by mouth daily. 100 tablet 3  . Cholecalciferol (VITAMIN D3) 2000 units TABS Take 2,000 Units by mouth at bedtime.    . Coenzyme Q10 (CO Q-10 PO) Take 1 tablet by mouth at bedtime.    . Cyanocobalamin (VITAMIN B-12) 1000 MCG SUBL Place 1 tablet (1,000 mcg total) under the tongue daily. (Patient taking differently: Place 1,000 mcg under the tongue at bedtime. ) 100 tablet 3  . DULoxetine (CYMBALTA) 60 MG capsule TAKE 1 CAPSULE BY MOUTH  DAILY 90 capsule 1  . finasteride (PROSCAR) 5 MG tablet TAKE 1 TABLET BY MOUTH  DAILY (Patient taking differently: TAKE 1 TABLET (5 MG) BY MOUTH  DAILY AT NIGHT) 90 tablet 3  . folic acid (V-R FOLIC ACID) 314 MCG tablet Take 1 tablet (400 mcg total) by mouth daily. (Patient taking differently: Take 400 mcg by mouth at bedtime. ) 90 tablet 3  . furosemide (LASIX) 20 MG tablet TAKE 1 TO 2 TABLETS BY  MOUTH DAILY AS NEEDED FOR  EDEMA (Patient taking differently: TAKE 1 TABLET (20 MG) BY  MOUTH DAILY IN THE EVENING) 180 tablet 3  . mycophenolate (CELLCEPT) 250 MG capsule Take 1,000 mg by mouth 2 (two) times daily.     Vladimir Faster Glycol-Propyl Glycol (LUBRICANT EYE DROPS) 0.4-0.3 % SOLN Place 1-2 drops into both eyes daily.    . predniSONE (DELTASONE) 20 MG tablet Take 60 mg by mouth.     . pyridostigmine (MESTINON) 60 MG tablet One in AM , one at lunch, 2 at bedtime. 450 tablet 3  . ranitidine (ZANTAC) 150 MG tablet Take 1 tablet (150 mg total) by mouth 2 (two) times daily. (Patient taking differently: Take 150 mg by mouth at bedtime. ) 180  tablet 3  . tamsulosin (FLOMAX) 0.4 MG CAPS capsule TAKE 2 CAPSULES BY MOUTH  DAILY 180 capsule 3  . vitamin C (ASCORBIC ACID) 500 MG tablet Take 1 tablet (500 mg total) by mouth daily. (Patient taking differently: Take 500 mg by mouth at bedtime. ) 100 tablet 1  . mycophenolate (CELLCEPT) 250 MG capsule Take 1 capsule (250 mg total) by mouth 2 (two) times daily. 60 capsule 5   No facility-administered medications prior to visit.     ROS: Review of Systems  Constitutional: Positive for fatigue. Negative for appetite change and unexpected weight change.  HENT: Negative for congestion, nosebleeds, sneezing, sore throat and trouble swallowing.   Eyes: Negative for itching and visual disturbance.  Respiratory: Negative for cough.   Cardiovascular: Negative for chest pain, palpitations and leg swelling.  Gastrointestinal: Negative for abdominal distention, blood in stool, diarrhea and nausea.  Genitourinary: Negative for frequency and hematuria.  Musculoskeletal: Positive for arthralgias, back pain and gait problem. Negative for joint swelling and neck pain.  Skin: Negative for rash.  Neurological: Negative for dizziness, tremors, speech difficulty and weakness.  Psychiatric/Behavioral: Negative for agitation, dysphoric mood, sleep disturbance and suicidal ideas. The patient is not nervous/anxious.     Objective:  BP 118/66 (BP Location: Left  Arm, Patient Position: Sitting, Cuff Size: Large)   Pulse (!) 54   Temp 98 F (36.7 C) (Oral)   Ht 6' (1.829 m)   Wt 227 lb (103 kg)   SpO2 98%   BMI 30.79 kg/m   BP Readings from Last 3 Encounters:  04/16/18 118/66  04/12/18 (!) 115/58  03/05/18 106/62    Wt Readings from Last 3 Encounters:  04/16/18 227 lb (103 kg)  04/12/18 223 lb (101.2 kg)  03/05/18 230 lb (104.3 kg)    Physical Exam  Constitutional: He is oriented to person, place, and time. He appears well-developed. No distress.  NAD  HENT:  Mouth/Throat: Oropharynx is clear  and moist.  Eyes: Pupils are equal, round, and reactive to light. Conjunctivae are normal.  Neck: Normal range of motion. No JVD present. No thyromegaly present.  Cardiovascular: Normal rate, regular rhythm, normal heart sounds and intact distal pulses. Exam reveals no gallop and no friction rub.  No murmur heard. Pulmonary/Chest: Effort normal and breath sounds normal. No respiratory distress. He has no wheezes. He has no rales. He exhibits no tenderness.  Abdominal: Soft. Bowel sounds are normal. He exhibits no distension and no mass. There is no tenderness. There is no rebound and no guarding.  Musculoskeletal: Normal range of motion. He exhibits no edema or tenderness.  Lymphadenopathy:    He has no cervical adenopathy.  Neurological: He is alert and oriented to person, place, and time. He has normal reflexes. No cranial nerve deficit. He exhibits normal muscle tone. He displays a negative Romberg sign. Coordination and gait normal.  Skin: Skin is warm and dry. No rash noted.  Psychiatric: He has a normal mood and affect. His behavior is normal. Judgment and thought content normal.   Walking better Lab Results  Component Value Date   WBC 4.3 02/05/2018   HGB 11.2 (L) 02/05/2018   HCT 34.6 (L) 02/05/2018   PLT 268 02/05/2018   GLUCOSE 123 (H) 02/05/2018   CHOL 182 12/01/2017   TRIG 174 (H) 12/01/2017   HDL 48 12/01/2017   LDLDIRECT 157.5 07/05/2008   LDLCALC 99 12/01/2017   ALT 12 02/05/2018   AST 17 02/05/2018   NA 144 02/05/2018   K 4.2 02/05/2018   CL 103 02/05/2018   CREATININE 0.65 (L) 02/05/2018   BUN 11 02/05/2018   CO2 24 02/05/2018   TSH 0.249 (L) 12/03/2017   PSA 0.49 03/01/2012   INR 0.99 11/30/2017   HGBA1C 5.5 02/05/2018    No results found.  Assessment & Plan:   There are no diagnoses linked to this encounter.   No orders of the defined types were placed in this encounter.    Follow-up: No follow-ups on file.  [name], MD

## 2018-04-18 ENCOUNTER — Other Ambulatory Visit: Payer: Self-pay | Admitting: Internal Medicine

## 2018-04-18 DIAGNOSIS — E876 Hypokalemia: Secondary | ICD-10-CM

## 2018-04-19 ENCOUNTER — Ambulatory Visit: Payer: Medicare Other | Admitting: Physical Therapy

## 2018-04-22 ENCOUNTER — Other Ambulatory Visit: Payer: Self-pay | Admitting: Internal Medicine

## 2018-04-27 ENCOUNTER — Ambulatory Visit: Payer: Medicare Other | Admitting: Physical Therapy

## 2018-04-27 DIAGNOSIS — G7000 Myasthenia gravis without (acute) exacerbation: Secondary | ICD-10-CM | POA: Diagnosis not present

## 2018-04-27 DIAGNOSIS — E611 Iron deficiency: Secondary | ICD-10-CM | POA: Diagnosis not present

## 2018-05-03 ENCOUNTER — Ambulatory Visit: Payer: Medicare Other | Admitting: Physical Therapy

## 2018-05-10 ENCOUNTER — Other Ambulatory Visit (INDEPENDENT_AMBULATORY_CARE_PROVIDER_SITE_OTHER): Payer: Medicare Other

## 2018-05-10 DIAGNOSIS — E876 Hypokalemia: Secondary | ICD-10-CM

## 2018-05-10 LAB — BASIC METABOLIC PANEL
BUN: 22 mg/dL (ref 6–23)
CO2: 36 mEq/L — ABNORMAL HIGH (ref 19–32)
Calcium: 9.5 mg/dL (ref 8.4–10.5)
Chloride: 100 mEq/L (ref 96–112)
Creatinine, Ser: 0.86 mg/dL (ref 0.40–1.50)
GFR: 91.61 mL/min (ref >60.00)
Glucose, Bld: 102 mg/dL — ABNORMAL HIGH (ref 70–99)
Potassium: 4.5 mEq/L (ref 3.5–5.1)
Sodium: 138 mEq/L (ref 135–145)

## 2018-05-11 DIAGNOSIS — G4733 Obstructive sleep apnea (adult) (pediatric): Secondary | ICD-10-CM | POA: Diagnosis not present

## 2018-05-20 ENCOUNTER — Other Ambulatory Visit: Payer: Self-pay

## 2018-05-20 NOTE — Patient Outreach (Signed)
[facility]) Care Management  05/20/2018  Miner Koral Siegel 1940-10-22 [id number]   Medication Adherence call to Mr. Jaydn Moscato left a message for patient to call back patient is due on Pravastatin 20 mg. [employer] has a disconnected number.Mr. Everard is showing past due under [place].   [facility] Management Direct Dial [phone]  Fax [phone] [email or web address]

## 2018-06-03 ENCOUNTER — Other Ambulatory Visit (INDEPENDENT_AMBULATORY_CARE_PROVIDER_SITE_OTHER): Payer: Medicare Other

## 2018-06-03 ENCOUNTER — Encounter: Payer: Self-pay | Admitting: Internal Medicine

## 2018-06-03 ENCOUNTER — Ambulatory Visit (INDEPENDENT_AMBULATORY_CARE_PROVIDER_SITE_OTHER): Payer: Medicare Other | Admitting: Internal Medicine

## 2018-06-03 DIAGNOSIS — D500 Iron deficiency anemia secondary to blood loss (chronic): Secondary | ICD-10-CM | POA: Diagnosis not present

## 2018-06-03 DIAGNOSIS — G7000 Myasthenia gravis without (acute) exacerbation: Secondary | ICD-10-CM

## 2018-06-03 DIAGNOSIS — J069 Acute upper respiratory infection, unspecified: Secondary | ICD-10-CM | POA: Diagnosis not present

## 2018-06-03 DIAGNOSIS — M5440 Lumbago with sciatica, unspecified side: Secondary | ICD-10-CM

## 2018-06-03 LAB — BASIC METABOLIC PANEL
BUN: 19 mg/dL (ref 6–23)
CO2: 32 mEq/L (ref 19–32)
Calcium: 9.8 mg/dL (ref 8.4–10.5)
Chloride: 103 mEq/L (ref 96–112)
Creatinine, Ser: 0.71 mg/dL (ref 0.40–1.50)
GFR: 114.27 mL/min (ref >60.00)
Glucose, Bld: 96 mg/dL (ref 70–99)
Potassium: 4.6 mEq/L (ref 3.5–5.1)
Sodium: 140 mEq/L (ref 135–145)

## 2018-06-03 LAB — CBC WITH DIFFERENTIAL/PLATELET
Basophils Absolute: 0 10*3/uL (ref 0.0–0.1)
Basophils Relative: 0.4 % (ref 0.0–3.0)
Eosinophils Absolute: 0 10*3/uL (ref 0.0–0.7)
Eosinophils Relative: 0 % (ref 0.0–5.0)
HCT: 34.5 % — ABNORMAL LOW (ref 39.0–52.0)
Hemoglobin: 11.1 g/dL — ABNORMAL LOW (ref 13.0–17.0)
Lymphocytes Relative: 6.7 % — ABNORMAL LOW (ref 12.0–46.0)
Lymphs Abs: 0.6 10*3/uL — ABNORMAL LOW (ref 0.7–4.0)
MCHC: 32.1 g/dL (ref 30.0–36.0)
MCV: 78 fl (ref 78.0–100.0)
Monocytes Absolute: 0.9 10*3/uL (ref 0.1–1.0)
Monocytes Relative: 10.6 % (ref 3.0–12.0)
Neutro Abs: 6.7 10*3/uL (ref 1.4–7.7)
Neutrophils Relative %: 82.3 % — ABNORMAL HIGH (ref 43.0–77.0)
Platelets: 254 10*3/uL (ref 150.0–400.0)
RBC: 4.43 Mil/uL (ref 4.22–5.81)
RDW: 20.4 % — ABNORMAL HIGH (ref 11.5–15.5)
WBC: 8.2 10*3/uL (ref 4.0–10.5)

## 2018-06-03 LAB — HEPATIC FUNCTION PANEL
ALT: 21 U/L (ref 0–53)
AST: 12 U/L (ref 0–37)
Albumin: 3.8 g/dL (ref 3.5–5.2)
Alkaline Phosphatase: 53 U/L (ref 39–117)
Bilirubin, Direct: 0.1 mg/dL (ref 0.0–0.3)
Total Bilirubin: 0.5 mg/dL (ref 0.2–1.2)
Total Protein: 6.1 g/dL (ref 6.0–8.3)

## 2018-06-03 MED ORDER — IPRATROPIUM BROMIDE 0.06 % NA SOLN: 2.0000 | Freq: Three times a day (TID) | NASAL | 2 refills | Status: DC

## 2018-06-03 MED ORDER — CEFUROXIME AXETIL 250 MG PO TABS: 250.0000 mg | ORAL_TABLET | Freq: Two times a day (BID) | ORAL | 0 refills | Status: DC

## 2018-06-03 NOTE — Assessment & Plan Note (Signed)
ceftin if worse

## 2018-06-03 NOTE — Progress Notes (Signed)
Subjective:  Patient ID: [name], male    DOB: 09/20/1940  Age: 77 y.o. MRN: [id number]  CC: No chief complaint on file.   HPI [name] presents for [facility] f/u C/o cough x 1 week w/yellow mucus  Outpatient Medications Prior to Visit  Medication Sig Dispense Refill  . aspirin EC 81 MG tablet Take 1 tablet (81 mg total) by mouth daily. 100 tablet 3  . Cholecalciferol (VITAMIN D3) 2000 units TABS Take 2,000 Units by mouth at bedtime.    . Coenzyme Q10 (CO Q-10 PO) Take 1 tablet by mouth at bedtime.    . Cyanocobalamin (VITAMIN B-12) 1000 MCG SUBL Place 1 tablet (1,000 mcg total) under the tongue daily. (Patient taking differently: Place 1,000 mcg under the tongue at bedtime. ) 100 tablet 3  . DULoxetine (CYMBALTA) 60 MG capsule TAKE 1 CAPSULE BY MOUTH  DAILY 90 capsule 1  . finasteride (PROSCAR) 5 MG tablet TAKE 1 TABLET BY MOUTH  DAILY (Patient taking differently: TAKE 1 TABLET (5 MG) BY MOUTH  DAILY AT NIGHT) 90 tablet 3  . furosemide (LASIX) 20 MG tablet TAKE 1 TO 2 TABLETS BY  MOUTH DAILY AS NEEDED FOR  EDEMA (Patient taking differently: TAKE 1 TABLET (20 MG) BY  MOUTH DAILY IN THE EVENING) 180 tablet 3  . mycophenolate (CELLCEPT) 250 MG capsule Take 1,000 mg by mouth 2 (two) times daily.     Vladimir Faster Glycol-Propyl Glycol (LUBRICANT EYE DROPS) 0.4-0.3 % SOLN Place 1-2 drops into both eyes daily.    . potassium chloride (K-DUR) 10 MEQ tablet Take 1 tablet (10 mEq total) by mouth 2 (two) times daily. 60 tablet 5  . predniSONE (DELTASONE) 50 MG tablet Take 50 mg by mouth daily with breakfast.    . pyridostigmine (MESTINON) 60 MG tablet One in AM , one at lunch, 2 at bedtime. 450 tablet 3  . ranitidine (ZANTAC) 150 MG tablet Take 1 tablet (150 mg total) by mouth 2 (two) times daily. (Patient taking differently: Take 150 mg by mouth at bedtime. ) 180 tablet 3  . tamsulosin (FLOMAX) 0.4 MG CAPS capsule TAKE 2 CAPSULES BY MOUTH  DAILY 025 capsule 3  . folic acid (V-R FOLIC  ACID) 427 MCG tablet Take 1 tablet (400 mcg total) by mouth daily. (Patient taking differently: Take 400 mcg by mouth at bedtime. ) 90 tablet 3   No facility-administered medications prior to visit.     ROS: Review of Systems  Constitutional: Negative for appetite change, fatigue and unexpected weight change.  HENT: Negative for congestion, nosebleeds, sneezing, sore throat and trouble swallowing.   Eyes: Negative for itching and visual disturbance.  Respiratory: Negative for cough.   Cardiovascular: Negative for chest pain, palpitations and leg swelling.  Gastrointestinal: Negative for abdominal distention, blood in stool, diarrhea and nausea.  Genitourinary: Negative for frequency and hematuria.  Musculoskeletal: Positive for arthralgias, back pain and gait problem. Negative for joint swelling and neck pain.  Skin: Negative for rash.  Neurological: Positive for weakness. Negative for dizziness, tremors and speech difficulty.  Psychiatric/Behavioral: Negative for agitation, dysphoric mood, sleep disturbance and suicidal ideas. The patient is not nervous/anxious.     Objective:  BP 118/64 (BP Location: Left Arm, Patient Position: Sitting, Cuff Size: Normal)   Pulse 63   Temp 98.1 F (36.7 C) (Oral)   Ht 6' (1.829 m)   Wt 233 lb (105.7 kg)   SpO2 95%   BMI 31.60 kg/m   BP  Readings from Last 3 Encounters:  06/03/18 118/64  04/16/18 118/66  04/12/18 (!) 115/58    Wt Readings from Last 3 Encounters:  06/03/18 233 lb (105.7 kg)  04/16/18 227 lb (103 kg)  04/12/18 223 lb (101.2 kg)    Physical Exam  Constitutional: He is oriented to person, place, and time. He appears well-developed. No distress.  NAD  HENT:  Mouth/Throat: Oropharynx is clear and moist.  Eyes: Pupils are equal, round, and reactive to light. Conjunctivae are normal.  Neck: Normal range of motion. No JVD present. No thyromegaly present.  Cardiovascular: Normal rate, regular rhythm, normal heart sounds and  intact distal pulses. Exam reveals no gallop and no friction rub.  No murmur heard. Pulmonary/Chest: Effort normal and breath sounds normal. No respiratory distress. He has no wheezes. He has no rales. He exhibits no tenderness.  Abdominal: Soft. Bowel sounds are normal. He exhibits no distension and no mass. There is no tenderness. There is no rebound and no guarding.  Musculoskeletal: Normal range of motion. He exhibits no edema or tenderness.  Lymphadenopathy:    He has no cervical adenopathy.  Neurological: He is alert and oriented to person, place, and time. He has normal reflexes. No cranial nerve deficit. He exhibits normal muscle tone. He displays a negative Romberg sign. Coordination abnormal. Gait normal.  Skin: Skin is warm and dry. No rash noted.  Psychiatric: He has a normal mood and affect. His behavior is normal. Judgment and thought content normal.  LS tender Weak legs eryth nasal mucosa  Lab Results  Component Value Date   WBC 4.3 02/05/2018   HGB 11.2 (L) 02/05/2018   HCT 34.6 (L) 02/05/2018   PLT 268 02/05/2018   GLUCOSE 102 (H) 05/10/2018   CHOL 182 12/01/2017   TRIG 174 (H) 12/01/2017   HDL 48 12/01/2017   LDLDIRECT 157.5 07/05/2008   LDLCALC 99 12/01/2017   ALT 12 02/05/2018   AST 17 02/05/2018   NA 138 05/10/2018   K 4.5 05/10/2018   CL 100 05/10/2018   CREATININE 0.86 05/10/2018   BUN 22 05/10/2018   CO2 36 (H) 05/10/2018   TSH 0.249 (L) 12/03/2017   PSA 0.49 03/01/2012   INR 0.99 11/30/2017   HGBA1C 5.5 02/05/2018    No results found.  Assessment & Plan:   There are no diagnoses linked to this encounter.   No orders of the defined types were placed in this encounter.    Follow-up: No follow-ups on file.  [name], MD

## 2018-06-03 NOTE — Assessment & Plan Note (Signed)
Doing better on Cellcept, steroids S/p Plasmapheresis

## 2018-06-03 NOTE — Assessment & Plan Note (Addendum)
Hematology at [facility] appt pending 08/2018

## 2018-06-03 NOTE — Assessment & Plan Note (Signed)
Tramadol prn ° Potential benefits of a long term opioids use as well as potential risks (i.e. addiction risk, apnea etc) and complications (i.e. Somnolence, constipation and others) were explained to the patient and were aknowledged. ° ° °

## 2018-06-04 ENCOUNTER — Telehealth: Payer: Self-pay | Admitting: Internal Medicine

## 2018-06-04 MED ORDER — CEFUROXIME AXETIL 250 MG PO TABS: 250.0000 mg | ORAL_TABLET | Freq: Two times a day (BID) | ORAL | 0 refills | Status: AC

## 2018-06-04 NOTE — Telephone Encounter (Signed)
Copied from [place] [id number]. Topic: Quick Communication - See Telephone Encounter >> Jun 04, 2018  3:08 PM Rutherford Nail, [place] wrote: CRM for notification. See Telephone encounter for: 06/04/18. Patient's wife calling and states that Dr Alain Marion sent in 14 pills of the cefUROXime (CEFTIN) 250 MG tablet instead of a 14 day supply. Patient's wife states that the pharmacy is going to give them the 14 pills, but they need a  new prescription for the rest of the amount. Please advise.  CB#: [phone] [id number] [place], [place] [facility] [address]

## 2018-06-04 NOTE — Telephone Encounter (Signed)
[facility] 

## 2018-06-07 ENCOUNTER — Encounter: Payer: Self-pay | Admitting: Neurology

## 2018-06-07 ENCOUNTER — Ambulatory Visit: Payer: Medicare Other | Admitting: Neurology

## 2018-06-07 VITALS — BP 132/64 | HR 84 | Ht 72.0 in | Wt 231.0 lb

## 2018-06-07 DIAGNOSIS — R29898 Other symptoms and signs involving the musculoskeletal system: Secondary | ICD-10-CM | POA: Diagnosis not present

## 2018-06-07 DIAGNOSIS — G7001 Myasthenia gravis with (acute) exacerbation: Secondary | ICD-10-CM

## 2018-06-07 DIAGNOSIS — G4733 Obstructive sleep apnea (adult) (pediatric): Secondary | ICD-10-CM

## 2018-06-07 DIAGNOSIS — R05 Cough: Secondary | ICD-10-CM

## 2018-06-07 DIAGNOSIS — R053 Chronic cough: Secondary | ICD-10-CM | POA: Insufficient documentation

## 2018-06-07 NOTE — Patient Instructions (Addendum)
We may use a BiPAP machine to treat your apnea with Myasthenia Gravis if you no longer have coughing and aspiration.   We can decide if this should be done in our next visit, wither with Np or me.

## 2018-06-07 NOTE — Progress Notes (Signed)
SLEEP MEDICINE CLINIC   Provider:  Larey Seat, M D  Referring Provider: Cassandria Anger, MD Primary Care Physician:  Cassandria Anger, MD  Chief Complaint  Patient presents with  . Follow-up    pt with wife, rm 77. pt states that going ok. the plasmaphoresis at [facility] has worked. since then no problems swallowing, no problems with his neck. states there are good days and bad days   06-07-2018, [name] is seen here in the presence of his spouse.  The patient has concluded for now his treatment was plasmapheresis and has overcome the weakness of his neck flexors extenders, reporting swallowing difficulties.  He has still cold symptom.  Overall he seems a little less short of breath and calmer.  He has still taken prednisone for a while, 50 mg daily now. His follow up at [facility] is the first Tuesday on November. He has a rounder face, more appetite and more girth.  He has not used CPAP- the new FFM did not seem comfortable. He is not compliant.    04-12-2018, MG patient treated with plasmapheresis , 3 treatments last week, one tomorrow , and one on Thursday. During my vacation he developed a head drop, orthopedist gave him a collar, he has swallowing difficulties, and was just given advise over the phone from [place] to increase Mestinon to 4 a day but no other follow up was offered, and the wife called [facility] - saw Dr. Earle Gell and now is on the aforementioned therapy.  He is supposed to start on prednisone and higher dose of Cellcept. He is walking better, has better control of his head and gait. He may have once a month follow up plasmaphoresis. He has a follow up with me in November.  His CPAP use has been sporadic and he has to sleep on his back.  He will try a FFM , dreamwear in large. MMSE 24/ 30 - MCI or early dementia ? He is alert , oriented, we tried MOCA- but he failed the trail making test has short term memory.    02-05-2018, Mestinon has helped to cure his constipation, he  has no longer ptosis, has been less weak. He is swallowing is improved. Mestinon to be taken before meals tid. Top lip feels swollen He lost is son Legrand Como at age 77 to the symptoms of Crohn's disease, next Saturday grave side memorial. He has noted more memory problems.  His gait is still impaired,but it seems not to be myasthenia, the left knee is swollen and stiff, knee replacement in feb 2019. He walked really well when he took prednisone. Dr .Theda Sers will follow in July. He is deeply affected. Grieving.  MOCA 22-24. MMSE 25/30.CT chest - no evidence of thymoma. I am hesitant to start prednisone, would rather use cellcept.    Interval history for [name] from 77-22-2019 ,post hospitalization.  The patient had knee replacement 09-25-2017 , had acute blood loss upon chronic anemia -needed iron infusion and received one on 2-16-,  and upon the second infusion 11-19-2017 developed speech and swallowing difficulties. He was in discomfort. The patient was admitted on 8 April through the [facility] ED was transferred to [facility] and admitted.  Per neurology [place] hospitalist the patient was introduced as a 77 year old Caucasian married male with a history significant for coronary artery disease, chronic anemia, iron infusions every 3 months.  The patient presented to the ED with dysarthria dysphagia and dysarthria which started as early as 29th  March after an iron insfusion. Further iron infusions were blocked from there on given the side effects.  The patient was evaluated by ear nose and throat with Dr. Erik Obey who had recommended a CT of the neck and speech therapy as well as a neurology consult.  2D echo showed an ejection fraction of 60% carotid Dopplers were open there was some intracranial coronary artery stenosis bilaterally but not considered significant, speech therapy evaluation observed no oral issues but aspiration danger.  He was made n.p.o. A chest x-ray and 2 views showed normal  heart size, a chronic elevation of the left hemidiaphragm.  White blood cell count on 10 April was 10.2 hemoglobin was 12.3 on the lower side hematocrit was normal at 39.4, glucose 152, triglycerides were elevated TSH was low, hemoglobin A1c was 4.6.  Low.  CT of the head from 8 April moderate to severe chronic small vessel disease no acute stroke or bleed.   MRI of the brain followed on 10 April with MRA.  No stenosis no occlusion even the distal branches of the intracranial vessels were well perfused and symmetrically so.  Dominant right vertebral artery.  No large vessel occlusion.  Dr Alain Marion  has seen him for ataxia and weakness, tongue weakness. 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 Progression from there. He underwent lower back treatment, no success, knee replacement did not to make a big difference in gait and exercise tolerance  Both knees feel disconnected. Alien legs.  Dr. Alain Marion then stand for exit to clean receptor antibodies found the binding antibody to be 70% and the blood levels at 6.9 far above normal.  It is very likely that we are dealing with a geriatric form of myasthenia gravis, and for this reason I will order a chest CT to evaluate him for a thymus gland growth I also asked him not to take any anticholinergic medication which includes antihistamine such as Benadryl, and bladder toning agents such as Detrol.       Interval history from 77 January 2019.  I have the pleasure of meeting with [name] today who is here for a compliance visit on CPAP.  The patient is highly compliant with CPAP use, 93% equals 28 out of 30 days, however 7 days for just below the 4-hour mark. Average use of time is 5 hours and 22 minutes each night, he is using an AutoSet between 7 and 15 cm water with 3 cm expiratory pressure relief.  The 95th percentile pressure is 13.2 the patient still has 4.9 residual obstructive apneas and very infrequent  central apneas.  His overall AHI was 6.2.  I would like to increase the maximum pressure to 16 cm water.  He wakes with a very dry mouth using a nasal mask in large size, a [employer]  Wisp in XL. His mouth will drop open.  He has biotin mouth wash, and a chin strap. Still,  much improved over the last visit results. He is much less sleepy, more active and can watch TV without dozing of now.    I'm seeing Mr. Serpe today on 03/05/2017 in a revisit. CDHe has undergone a split-night titration study on 11/17/2016 and was re- diagnosed with obstructive sleep apnea. He had mostly shallow breathing spells, all obstructive in nature, with an index of 33.3 per hour of sleep, during REM sleep his AHI was 56.3 and in supine sleeps 32.8/hr. He also describes  arousals out of dream sleep sometimes with a hint of confusion. The CPAP titration did make an impact but the patient only slept 12 minutes at the 12 cm water pressure that the front most beneficial. He produced a lot of apneas in spite of being on CPAP at pressures just below. For this reason I had placed him on an auto titrating between 7 and 15 cm water with 3 cm expiratory pressure relief. The patient returns today with high pressure leaks and a high number of residual apneas at 13.9 per hour.  We have to me today to find a way to reduce his apnea to under 5.0 / hr. This seems to be a mask fit issues. The patient has a lot of air leaks and he describes discomfort from either tightening the mask too much or from having sounds produced by the air leaking. He remains fatigued with it fatigue severity score 53 and his Epworth score is elevated at 13 points. He is not depressed according to his geriatric depression scale at 2 points.   HPI: SREEKAR BROYHILL is a 77 y.o. male , seen here as a referral  from Dr. Alain Marion for a sleep consultation. [name] is a 77 year old, right handed, caucasian married male patient, with a history of OSA. His  original sleep study was performed at [facility] on [facility] in 2000  and revealed " severe" Obstructive Sleep Apnea. He has not been able To use CPAP, but it was not that he could not tolerate the CPAP- the durable medical equipment company furnished machine went out of business was in a month or so after his machine was issued  and he could never get supplies again. Their office was on [address] he recalls. 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 He is surprised to learn that there are nasal pillows, nasal masks etc. and how small and quiet today CPAP machines. I also explained that many CPAP machines can be used as BiPAP as well.  [name] past medical history includes spot related injuries making it necessary to undergo a total knee replacement in the year 2002, he had 3 lumbar fusions, a tonsillectomy in childhood, he has been diagnosed in the past with iron deficiency anemia, has been treated for chronic pain with narcotic medications. 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 Chief complaint according to patient : " I snore, I stop breathing, my wife is concerned about my erratic breathing"  Sleep habits are as follows:  He usually goes to bed to watch TV news -around 11.00 PM . And it will take him usually 15-30 minutes but not longer to go to sleep, and usually falls asleep on his side. He does resume a supine position later during the night. He does snore and his wife reports that breathing is worse when supine position. He sleeps on only one pillow but elevates the head of bed with a wedge. His bedroom is described as cool, quiet and dark. Nocturia 3 or 4 at night. He wakes with hand and leg pain, cramping.  He rises at 8 AM, and feels " OK ", but craves more sleep. He wakes without headaches, but feels stiff.    Sleep  medical history and family sleep history:  Tonsillectomy in childhood. Social history: ex smoker ,  since 1987, ETOH ,  Wine once a month , caffeine : coffee 4-6  cups, iced tea 1 glas at dinner, Soda - rarely. No shift work history, worked as a Dealer.   Review of Systems: Out of a complete 14 system review, the patient complains of only the following symptoms, and all other reviewed systems are negative.  On mestinon   Epworth score/, Fatigue severity score / depression score /  Social History   Socioeconomic History  . Marital status: Married    Spouse name: Not on file  . Number of children: Not on file  . Years of education: Not on file  . Highest education level: Not on file  Occupational History  . Occupation: Retired    Fish farm manager: RETIRED    CommentDatabase administrator  Social Needs  . Financial resource strain: Not on file  . Food insecurity:    Worry: Not on file    Inability: Not on file  . Transportation needs:    Medical: Not on file    Non-medical: Not on file  Tobacco Use  . Smoking status: Former Smoker    Last attempt to quit: 01/08/1987    Years since quitting: 31.4  . Smokeless tobacco: Never Used  Substance and Sexual Activity  . Alcohol use: Yes    Alcohol/week: 0.0 standard drinks    Comment: rare  . Drug use: No  . Sexual activity: Not Currently  Lifestyle  . Physical activity:    Days per week: Not on file    Minutes per session: Not on file  . Stress: Not on file  Relationships  . Social connections:    Talks on phone: Not on file    Gets together: Not on file    Attends religious service: Not on file    Active member of club or organization: Not on file    Attends meetings of clubs or organizations: Not on file    Relationship status: Not on file  . Intimate partner violence:    Fear of current or ex partner: Not on file    Emotionally abused: Not on file    Physically abused: Not on file    Forced sexual activity: Not on file  Other Topics  Concern  . Not on file  Social History Narrative   He lives with wife in a 2 story home.  Has 2 children.   Retired Dealer.   Highest level of education:  High school   Regular Exercise -  YES    Family History  Problem Relation Age of Onset  . Aneurysm Father        AAA  . Heart disease Father 84       CHF  . Alzheimer's disease Father   . Cancer Brother   . Alzheimer's disease Brother        dementia  . Stomach cancer Brother   . Cancer Mother 41       breast cancer  . Heart disease Mother 79       MI  . Autoimmune disease Son   . CAD Brother   . Colon cancer Neg Hx   . Esophageal cancer Neg Hx   . Pancreatic cancer Neg Hx   . Prostate cancer Neg Hx   . Rectal cancer Neg Hx     Past Medical History:  Diagnosis Date  . Anemia   . Blood transfusion without reported diagnosis 02/23/2014   3 units for iron deficiency anemia. as a baby -  whenhe had pneumonia  . BPH (benign prostatic hyperplasia)   . CTS (carpal tunnel syndrome)    better  . Dyspnea   . GERD (gastroesophageal reflux disease)   . Hyperlipidemia   . Hypertension    "THEY DIAGNOSED ME YEARS AGO BUT LATELY IVE ACTUALLY HAD LOW BLOOD PRESSURES "   . Insomnia   . LBP (low back pain)   . Osteoarthritis   . Pneumonia    as a baby  . Sleep apnea    no cpap    Past Surgical History:  Procedure Laterality Date  . CARDIAC CATHETERIZATION N/A 08/15/2015   Procedure: Left Heart Cath and Coronary Angiography;  Surgeon: Lorretta Harp, MD;  Location: [facility];  Service: Cardiovascular;  Laterality: N/A;  . CARPAL TUNNEL RELEASE Bilateral 1996, 2004   Dr Lenoard Aden thumb surgery.  more than once  . COLONOSCOPY  2005, 2012   diverticulosis.   Marland Kitchen ESOPHAGOGASTRODUODENOSCOPY N/A 02/23/2014   Procedure: ESOPHAGOGASTRODUODENOSCOPY (EGD);  Surgeon: Irene Shipper, MD;  Location: [facility];  Service: Endoscopy;  Laterality: N/A;  . EYE SURGERY  2012   both cataracts, Lasik  . LEFT KNEE ARTHROSCOPY  Left 07/16/2017    [facility]   . LUMBAR FUSION     x 3  . REVERSE SHOULDER ARTHROPLASTY Left 07/04/2016   Procedure: LEFT REVERSE SHOULDER ARTHROPLASTY;  Surgeon: Netta Cedars, MD;  Location: [place];  Service: Orthopedics;  Laterality: Left;  . TONSILLECTOMY  1946  . TOTAL KNEE ARTHROPLASTY  2003   Right  . TOTAL KNEE ARTHROPLASTY Left 09/25/2017   Procedure: LEFT TOTAL KNEE ARTHROPLASTY;  Surgeon: Sydnee Cabal, MD;  Location: [facility];  Service: Orthopedics;  Laterality: Left;    Current Outpatient Medications  Medication Sig Dispense Refill  . aspirin EC 81 MG tablet Take 1 tablet (81 mg total) by mouth daily. 100 tablet 3  . cefUROXime (CEFTIN) 250 MG tablet Take 1 tablet (250 mg total) by mouth 2 (two) times daily for 14 days. 28 tablet 0  . Cholecalciferol (VITAMIN D3) 2000 units TABS Take 2,000 Units by mouth at bedtime.    . Coenzyme Q10 (CO Q-10 PO) Take 1 tablet by mouth at bedtime.    . Cyanocobalamin (VITAMIN B-12) 1000 MCG SUBL Place 1 tablet (1,000 mcg total) under the tongue daily. (Patient taking differently: Place 1,000 mcg under the tongue at bedtime. ) 100 tablet 3  . DULoxetine (CYMBALTA) 60 MG capsule TAKE 1 CAPSULE BY MOUTH  DAILY 90 capsule 1  . finasteride (PROSCAR) 5 MG tablet TAKE 1 TABLET BY MOUTH  DAILY (Patient taking differently: TAKE 1 TABLET (5 MG) BY MOUTH  DAILY AT NIGHT) 90 tablet 3  . furosemide (LASIX) 20 MG tablet TAKE 1 TO 2 TABLETS BY  MOUTH DAILY AS NEEDED FOR  EDEMA (Patient taking differently: TAKE 1 TABLET (20 MG) BY  MOUTH DAILY IN THE EVENING) 180 tablet 3  . ipratropium (ATROVENT) 0.06 % nasal spray Place 2 sprays into the nose 3 (three) times daily. 15 mL 2  . mycophenolate (CELLCEPT) 250 MG capsule Take 1,000 mg by mouth 2 (two) times daily.     Vladimir Faster Glycol-Propyl Glycol (LUBRICANT EYE DROPS) 0.4-0.3 % SOLN Place 1-2 drops into both eyes daily.    . potassium chloride (K-DUR) 10 MEQ tablet Take 1 tablet (10 mEq total) by mouth 2 (two) times  daily. 60 tablet 5  . predniSONE (DELTASONE) 50 MG tablet Take 50 mg by mouth daily with  breakfast.    . pyridostigmine (MESTINON) 60 MG tablet One in AM , one at lunch, 2 at bedtime. (Patient taking differently: One in AM and  one at lunch) 450 tablet 3  . ranitidine (ZANTAC) 150 MG tablet Take 1 tablet (150 mg total) by mouth 2 (two) times daily. (Patient taking differently: Take 150 mg by mouth at bedtime. ) 180 tablet 3  . tamsulosin (FLOMAX) 0.4 MG CAPS capsule TAKE 2 CAPSULES BY MOUTH  DAILY 180 capsule 3   No current facility-administered medications for this visit.     Allergies as of 06/07/2018  . (No Known Allergies)    Vitals: BP 132/64   Pulse 84   Ht 6' (1.829 m)   Wt 231 lb (104.8 kg)   BMI 31.33 kg/m    Last Weight:  Wt Readings from Last 1 Encounters:  06/07/18 231 lb (104.8 kg)   TDV:VOHY mass index is 31.33 kg/m.     Last Height:   Ht Readings from Last 1 Encounters:  06/07/18 6' (1.829 m)    Physical exam:  General: The patient is awake, alert and is well groomed. Head: Normocephalic, atraumatic.  Neck is supple. Mallampati 3-4  ,  neck circumference:19. Nasal airflow patent, TMJ click evident . Retrognathia is not seen.  Cardiovascular:  Regular rate and rhythm, without  murmurs or carotid bruit, and without distended neck veins. Respiratory: Lungs are clear to auscultation. Skin:  He has a bronzed skin color-  And looks healthier.  Trunk: BMI is 33  Neurologic exam : The patient is awake and alert. Speech is fluent, with hoarseness, dysphonia. Mood and affect are depressed.  Cranial nerves: Pupils are equal , EOM without nystagmus. Left sided ptosis.  Hearing to finger rub intact. Facial sensation intact to fine touch. Facial motor strength: facial expression is symmetric, his tongue and uvula move midline. He can whistle.  upper lip on the left curls.    Shoulder shrug was weaker on the left.The deep tendon reflexes are preserved , and the  patellar reflexes actually slightly more brisk on the left than right,   watch the patient walk and he did not show a lot of global rotation, he turned with 4 steps his gait was not wide-based but his step with was reduced, he takes smaller steps very careful deliberately looking at his feet as he walks, he did not provide any evidence of parkinsonism there was no shuffling, no tremor my impression was more that of a spinal stenosis as a possible gait disorder.   Given that the patient has rather focal hip abduction hip flexion and abduction weakness as well as having overcome weakness of the tongue and lips   I spent more than 40 minutes of face to face time with the patient. Greater than 50% of time was spent in counseling and coordination of care. We have discussed the diagnosis and differential and I answered the patient's questions.    1) He started mestinon tid, his [facility] neurologist encouraged him to take it 2 times daily.  Watch for drooling or diarrhea.   [facility] [address]  2) He takes cellcept - bid, 1000 mg . 3) Taper Prednisone from 60-50 mg daily when last seen at [facility].  4) plasmapheresis at [facility] completed. He is awaiting to see a hematologist.    WBC 4.0 - 10.5 K/uL 8.2   RBC 4.22 - 5.81 Mil/uL 4.43   Hemoglobin 13.0 - 17.0 g/dL 11.1Low    HCT 39.0 -  52.0 % 34.5Low    MCV 78.0 - 100.0 fl 78.0   MCHC 30.0 - 36.0 g/dL 32.1   RDW 11.5 - 15.5 % 20.4High    Platelets 150.0 - 400.0 K/uL 254.0   Neutrophils Relative % 43.0 - 77.0 % 82.3High    Lymphocytes Relative 12.0 - 46.0 % 6.7 Repeated and verified X2.Low    Monocytes Relative 3.0 - 12.0 % 10.6   Eosinophils Relative 0.0 - 5.0 % 0.0   Basophils Relative 0.0 - 3.0 % 0.4   Neutro Abs 1.4 - 7.7 K/uL 6.7   Lymphs Abs 0.7 - 4.0 K/uL 0.6Low    Monocytes Absolute 0.1 - 1.0 K/uL 0.9   Eosinophils Absolute 0.0 - 0.7 K/uL 0.0   Basophils Absolute 0.0 - 0.1 K/uL 0.0   Resulting Agency  [facility]      Specimen Collected:  06/03/18 14:30 Last Resulted: 06/03/18 15:33          Ref Range & Units 4d ago  Sodium 135 - 145 mEq/L 140   Potassium 3.5 - 5.1 mEq/L 4.6   Chloride 96 - 112 mEq/L 103   CO2 19 - 32 mEq/L 32   Glucose, Bld 70 - 99 mg/dL 96   BUN 6 - 23 mg/dL 19   Creatinine, Ser 0.40 - 1.50 mg/dL 0.71   Calcium 8.4 - 10.5 mg/dL 9.8   GFR >60.00 mL/min 114.27   Resulting Agency  [place] [facility]      Specimen Collected: 06/03/18 14:30 Last Resulted: 06/03/18 16:19     Assessment:     Patient is doing well on the [facility] management - I think we can follow once yearly now that he is stable.   Rv in 10-12 month with MOCA- MMSE with NP.   I will d/c CPAP - coughing treatment with PCP.    Asencion Partridge Ardenia Stiner MD  06/07/2018   CC: Cassandria Anger, Md [address] [place], [place] [address]

## 2018-06-29 DIAGNOSIS — D508 Other iron deficiency anemias: Secondary | ICD-10-CM | POA: Diagnosis not present

## 2018-06-29 DIAGNOSIS — G7000 Myasthenia gravis without (acute) exacerbation: Secondary | ICD-10-CM | POA: Diagnosis not present

## 2018-06-29 DIAGNOSIS — G4733 Obstructive sleep apnea (adult) (pediatric): Secondary | ICD-10-CM | POA: Diagnosis not present

## 2018-06-29 DIAGNOSIS — R413 Other amnesia: Secondary | ICD-10-CM | POA: Diagnosis not present

## 2018-07-14 ENCOUNTER — Telehealth: Payer: Self-pay | Admitting: Neurology

## 2018-07-14 NOTE — Telephone Encounter (Signed)
Called the patient's wife back and was able to discuss concerns she was having about the pt. Pt had talked with her and just had not been able to get good nights sleep. In October visit the CPAP was dc due to non compliance as well as myasthenia gravis complications. She says in the last 2 weeks especially she has noticed his sleeping is not good and waking up with memory issues more then normal. She wanted to know if Dr Brett Fairy would be agreeable to him restarting the CPAP. They still have his CPAP. Last download from the CPAP was 05/24/18 and it showed the auto CPAP set at 7-13 cm water pressure with 3 cm EPR. Pt's wife is asking if he can try restarting it. Advised the patients wife I would make Dr Dohmeier aware and see her thoughts. Im certain she would be ok with him restarting but he would need to be compliant with being able to get supplies paid for by medicare. I will discuss and let her know thoughts.

## 2018-07-14 NOTE — Telephone Encounter (Signed)
Let him try the machine at the current settings again and we ask for a 30 day download.

## 2018-07-14 NOTE — Telephone Encounter (Signed)
Pt wife(on DPR) has called stating that pt is having difficulties sleeping, she states it has to do with the CPAP and his ,Myasthenia gravis.  Wife states she was told by Dr Brett Fairy that she sometimes has openings for office visits some Friday mornings, wife is asking to be called back re: a possible work in as soon as possible on a Friday morning

## 2018-07-15 NOTE — Telephone Encounter (Signed)
Called the patient's wife and advised her that Dr Brett Fairy was ok with the patient restarting an using the machine with the current settings. Advised the wife Dr Brett Fairy would like for him to really try and use it consistently and then in 30 days we can get a 30 day download. Advised them to call the office and remind [place] to pull that download so that way we can see how things are with the machine. Pt did state that he had a better nights sleep last night with restarting the machine. Pt verbalized understanding of instructions.

## 2018-07-20 ENCOUNTER — Ambulatory Visit: Payer: Medicare Other | Admitting: Neurology

## 2018-08-10 DIAGNOSIS — G7000 Myasthenia gravis without (acute) exacerbation: Secondary | ICD-10-CM | POA: Diagnosis not present

## 2018-08-10 DIAGNOSIS — Z5181 Encounter for therapeutic drug level monitoring: Secondary | ICD-10-CM | POA: Diagnosis not present

## 2018-08-16 DIAGNOSIS — G4733 Obstructive sleep apnea (adult) (pediatric): Secondary | ICD-10-CM | POA: Diagnosis not present

## 2018-08-29 ENCOUNTER — Other Ambulatory Visit: Payer: Self-pay | Admitting: Internal Medicine

## 2018-09-07 ENCOUNTER — Ambulatory Visit: Payer: Medicare Other | Admitting: Neurology

## 2018-09-08 ENCOUNTER — Ambulatory Visit: Payer: Medicare Other | Admitting: Internal Medicine

## 2018-09-08 NOTE — Progress Notes (Deleted)
Subjective:   [name] is a 78 y.o. male who presents for Medicare Annual/Subsequent preventive examination.  Review of Systems:  No ROS.  Medicare Wellness Visit. Additional risk factors are reflected in the social history.    Sleep patterns: {SX; SLEEP PATTERNS:18802::"feels rested on waking","does not get up to void","gets up *** times nightly to void","sleeps *** hours nightly"}.    Home Safety/Smoke Alarms: Feels safe in home. Smoke alarms in place.  Living environment; residence and Firearm Safety: {Rehab home environment / accessibility:30080::"no firearms","firearms stored safely"}. Seat Belt Safety/Bike Helmet: Wears seat belt.     Objective:    Vitals: There were no vitals taken for this visit.  There is no height or weight on file to calculate BMI.  Advanced Directives 03/04/2018 09/25/2017 09/18/2017 07/01/2016 03/02/2014 02/21/2014 01/08/2012  Does Patient Have a Medical Advance Directive? Yes No No No Patient does not have advance directive Patient does not have advance directive;Patient would not like information Patient does not have advance directive  Would patient like information on creating a medical advance directive? - No - Patient declined No - Patient declined No - patient declined information - - -  Pre-existing out of facility DNR order (yellow form or pink MOST form) - - - - - No -    Tobacco Social History   Tobacco Use  Smoking Status Former Smoker  . Last attempt to quit: 01/08/1987  . Years since quitting: 31.6  Smokeless Tobacco Never Used     Counseling given: Not Answered  Past Medical History:  Diagnosis Date  . Anemia   . Blood transfusion without reported diagnosis 02/23/2014   3 units for iron deficiency anemia. as a baby - whenhe had pneumonia  . BPH (benign prostatic hyperplasia)   . CTS (carpal tunnel syndrome)    better  . Dyspnea   . GERD (gastroesophageal reflux disease)   . Hyperlipidemia   . Hypertension    "THEY  DIAGNOSED ME YEARS AGO BUT LATELY IVE ACTUALLY HAD LOW BLOOD PRESSURES "   . Insomnia   . LBP (low back pain)   . Osteoarthritis   . Pneumonia    as a baby  . Sleep apnea    no cpap   Past Surgical History:  Procedure Laterality Date  . CARDIAC CATHETERIZATION N/A 08/15/2015   Procedure: Left Heart Cath and Coronary Angiography;  Surgeon: Lorretta Harp, MD;  Location: [facility];  Service: Cardiovascular;  Laterality: N/A;  . CARPAL TUNNEL RELEASE Bilateral 1996, 2004   Dr Lenoard Aden thumb surgery.  more than once  . COLONOSCOPY  2005, 2012   diverticulosis.   Marland Kitchen ESOPHAGOGASTRODUODENOSCOPY N/A 02/23/2014   Procedure: ESOPHAGOGASTRODUODENOSCOPY (EGD);  Surgeon: Irene Shipper, MD;  [facility];  Service: Endoscopy;  Laterality: N/A;  . EYE SURGERY  2012   both cataracts, Lasik  . LEFT KNEE ARTHROSCOPY Left 07/16/2017    [facility]   . LUMBAR FUSION     x 3  . REVERSE SHOULDER ARTHROPLASTY Left 07/04/2016   Procedure: LEFT REVERSE SHOULDER ARTHROPLASTY;  Surgeon: Netta Cedars, MD;  Location: [facility];  Service: Orthopedics;  Laterality: Left;  . TONSILLECTOMY  1946  . TOTAL KNEE ARTHROPLASTY  2003   Right  . TOTAL KNEE ARTHROPLASTY Left 09/25/2017   Procedure: LEFT TOTAL KNEE ARTHROPLASTY;  Surgeon: Sydnee Cabal, MD;  Location: [facility];  Service: Orthopedics;  Laterality: Left;   Family History  Problem Relation Age of Onset  . Aneurysm Father  AAA  . Heart disease Father 62       CHF  . Alzheimer's disease Father   . Cancer Brother   . Alzheimer's disease Brother        dementia  . Stomach cancer Brother   . Cancer Mother 39       breast cancer  . Heart disease Mother 19       MI  . Autoimmune disease Son   . CAD Brother   . Colon cancer Neg Hx   . Esophageal cancer Neg Hx   . Pancreatic cancer Neg Hx   . Prostate cancer Neg Hx   . Rectal cancer Neg Hx    Social History   Socioeconomic History  . Marital status: Married    Spouse name: Not  on file  . Number of children: Not on file  . Years of education: Not on file  . Highest education level: Not on file  Occupational History  . Occupation: Retired    Fish farm manager: RETIRED    CommentDatabase administrator  Social Needs  . Financial resource strain: Not on file  . Food insecurity:    Worry: Not on file    Inability: Not on file  . Transportation needs:    Medical: Not on file    Non-medical: Not on file  Tobacco Use  . Smoking status: Former Smoker    Last attempt to quit: 01/08/1987    Years since quitting: 31.6  . Smokeless tobacco: Never Used  Substance and Sexual Activity  . Alcohol use: Yes    Alcohol/week: 0.0 standard drinks    Comment: rare  . Drug use: No  . Sexual activity: Not Currently  Lifestyle  . Physical activity:    Days per week: Not on file    Minutes per session: Not on file  . Stress: Not on file  Relationships  . Social connections:    Talks on phone: Not on file    Gets together: Not on file    Attends religious service: Not on file    Active member of club or organization: Not on file    Attends meetings of clubs or organizations: Not on file    Relationship status: Not on file  Other Topics Concern  . Not on file  Social History Narrative   He lives with wife in a 2 story home.  Has 2 children.   Retired Dealer.   Highest level of education:  High school   Regular Exercise -  YES    Outpatient Encounter Medications as of 09/09/2018  Medication Sig  . aspirin EC 81 MG tablet Take 1 tablet (81 mg total) by mouth daily.  . Cholecalciferol (VITAMIN D3) 2000 units TABS Take 2,000 Units by mouth at bedtime.  . Coenzyme Q10 (CO Q-10 PO) Take 1 tablet by mouth at bedtime.  . Cyanocobalamin (VITAMIN B-12) 1000 MCG SUBL Place 1 tablet (1,000 mcg total) under the tongue daily. (Patient taking differently: Place 1,000 mcg under the tongue at bedtime. )  . DULoxetine (CYMBALTA) 60 MG capsule TAKE 1 CAPSULE BY MOUTH  DAILY  . finasteride (PROSCAR) 5  MG tablet TAKE 1 TABLET BY MOUTH  DAILY  . furosemide (LASIX) 20 MG tablet TAKE 1 TABLET (20 MG) BY  MOUTH DAILY IN THE EVENING  . ipratropium (ATROVENT) 0.06 % nasal spray Place 2 sprays into the nose 3 (three) times daily.  Vladimir Faster Glycol-Propyl Glycol (LUBRICANT EYE DROPS) 0.4-0.3 % SOLN Place 1-2 drops into both  eyes daily.  . potassium chloride (K-DUR) 10 MEQ tablet Take 1 tablet (10 mEq total) by mouth 2 (two) times daily.  . predniSONE (DELTASONE) 50 MG tablet Take 50 mg by mouth daily with breakfast.  . pyridostigmine (MESTINON) 60 MG tablet One in AM , one at lunch, 2 at bedtime. (Patient taking differently: One in AM and  one at lunch)  . ranitidine (ZANTAC) 150 MG tablet Take 1 tablet (150 mg total) by mouth 2 (two) times daily. (Patient taking differently: Take 150 mg by mouth at bedtime. )  . tamsulosin (FLOMAX) 0.4 MG CAPS capsule TAKE 2 CAPSULES BY MOUTH  DAILY   No facility-administered encounter medications on file as of 09/09/2018.     Activities of Daily Living In your present state of health, do you have any difficulty performing the following activities: 09/26/2017 09/18/2017  Hearing? Tempie Donning  Vision? N N  Difficulty concentrating or making decisions? N N  Walking or climbing stairs? Y N  Dressing or bathing? N N  Doing errands, shopping? N N  Some recent data might be hidden    Patient Care Team: [name], [name], MD as PCP - General Calvert Cantor, MD (Ophthalmology) Lelon Perla, MD (Cardiology) Irene Shipper, MD as Consulting Physician (Gastroenterology) Netta Cedars, MD as Consulting Physician (Orthopedic Surgery) Sydnee Cabal, MD as Consulting Physician (Orthopedic Surgery)   Assessment:   This is a routine wellness examination for [place]. Physical assessment deferred to PCP.  Exercise Activities and Dietary recommendations   Diet (meal preparation, eat out, water intake, caffeinated beverages, dairy products, fruits and vegetables): {Desc;  diets:16563}     Goals   None     Fall Risk Fall Risk  04/12/2018 01/13/2018 04/15/2017 10/15/2015 06/06/2015  Falls in the past year? No No No No No  Number falls in past yr: - - - - -  Injury with Fall? - - - - -  Risk Factor Category  - - - - -  Comment - - - - -  Risk for fall due to : - - - - -  Risk for fall due to: Comment - - - - -    Depression Screen PHQ 2/9 Scores 06/03/2018 04/15/2017 06/06/2015 03/28/2014  PHQ - 2 Score 0 0 0 0    Cognitive Function MMSE - Mini Mental State Exam 04/12/2018 02/05/2018  Orientation to time 3 4  Orientation to Place 5 4  Registration 3 3  Attention/ Calculation 5 5  Recall 0 1  Language- name 2 objects 2 2  Language- repeat 1 0  Language- follow 3 step command 3 3  Language- read & follow direction 0 1  Write a sentence 1 1  Copy design 1 1  Total score 24 25        Immunization History  Administered Date(s) Administered  . H1N1 08/02/2008  . Influenza Whole 06/10/2006, 06/15/2008, 05/22/2009, 05/25/2010, 05/26/2011, 04/25/2012  . Influenza, High Dose Seasonal PF 04/15/2017  . Influenza,inj,Quad PF,6+ Mos 05/23/2013, 04/13/2014, 05/23/2015, 04/16/2016  . Pneumococcal Conjugate-13 07/10/2014  . Pneumococcal Polysaccharide-23 06/25/2006, 12/30/2016  . Td 07/04/2009  . Tdap 06/04/2013  . Zoster 12/24/2006  . Zoster Recombinat (Shingrix) 12/28/2016   Screening Tests Health Maintenance  Topic Date Due  . URINE MICROALBUMIN  03/13/1951  . TETANUS/TDAP  06/05/2023  . INFLUENZA VACCINE  Completed  . PNA vac Low Risk Adult  Completed       Plan:     I have personally reviewed and  noted the following in the patient's chart:   . Medical and social history . Use of alcohol, tobacco or illicit drugs  . Current medications and supplements . Functional ability and status . Nutritional status . Physical activity . Advanced directives . List of other physicians . Vitals . Screenings to include cognitive, depression, and  falls . Referrals and appointments  In addition, I have reviewed and discussed with patient certain preventive protocols, quality metrics, and best practice recommendations. A written personalized care plan for preventive services as well as general preventive health recommendations were provided to patient.     Michiel Cowboy, RN  09/08/2018

## 2018-09-09 ENCOUNTER — Encounter: Payer: Self-pay | Admitting: Internal Medicine

## 2018-09-09 ENCOUNTER — Ambulatory Visit (INDEPENDENT_AMBULATORY_CARE_PROVIDER_SITE_OTHER): Payer: Medicare Other | Admitting: Internal Medicine

## 2018-09-09 ENCOUNTER — Ambulatory Visit: Payer: Medicare Other | Admitting: Internal Medicine

## 2018-09-09 ENCOUNTER — Ambulatory Visit: Payer: Medicare Other

## 2018-09-09 DIAGNOSIS — G7000 Myasthenia gravis without (acute) exacerbation: Secondary | ICD-10-CM | POA: Diagnosis not present

## 2018-09-09 DIAGNOSIS — D500 Iron deficiency anemia secondary to blood loss (chronic): Secondary | ICD-10-CM

## 2018-09-09 DIAGNOSIS — E538 Deficiency of other specified B group vitamins: Secondary | ICD-10-CM | POA: Diagnosis not present

## 2018-09-09 DIAGNOSIS — K219 Gastro-esophageal reflux disease without esophagitis: Secondary | ICD-10-CM | POA: Insufficient documentation

## 2018-09-09 DIAGNOSIS — I2510 Atherosclerotic heart disease of native coronary artery without angina pectoris: Secondary | ICD-10-CM

## 2018-09-09 MED ORDER — FAMOTIDINE 40 MG PO TABS: 40.0000 mg | ORAL_TABLET | Freq: Every day | ORAL | 3 refills | Status: DC

## 2018-09-09 NOTE — Assessment & Plan Note (Signed)
Pepcid?

## 2018-09-09 NOTE — Assessment & Plan Note (Signed)
CBC

## 2018-09-09 NOTE — Assessment & Plan Note (Signed)
Prednisone 30 mg/d

## 2018-09-09 NOTE — Assessment & Plan Note (Signed)
ASA, Simvastatin 

## 2018-09-09 NOTE — Assessment & Plan Note (Signed)
On B12 

## 2018-09-09 NOTE — Progress Notes (Signed)
Subjective:  Patient ID: [name], male    DOB: [birth year]  Age: 78 y.o. MRN: [id number]  CC: No chief complaint on file.   HPI Yani Lal Strey presents for MG, GERD, CAD f/u  Outpatient Medications Prior to Visit  Medication Sig Dispense Refill  . aspirin EC 81 MG tablet Take 1 tablet (81 mg total) by mouth daily. 100 tablet 3  . Cholecalciferol (VITAMIN D3) 2000 units TABS Take 2,000 Units by mouth at bedtime.    . Coenzyme Q10 (CO Q-10 PO) Take 1 tablet by mouth at bedtime.    . Cyanocobalamin (VITAMIN B-12) 1000 MCG SUBL Place 1 tablet (1,000 mcg total) under the tongue daily. (Patient taking differently: Place 1,000 mcg under the tongue at bedtime. ) 100 tablet 3  . DULoxetine (CYMBALTA) 60 MG capsule TAKE 1 CAPSULE BY MOUTH  DAILY 90 capsule 1  . finasteride (PROSCAR) 5 MG tablet TAKE 1 TABLET BY MOUTH  DAILY 90 tablet 3  . furosemide (LASIX) 20 MG tablet TAKE 1 TABLET (20 MG) BY  MOUTH DAILY IN THE EVENING 90 tablet 3  . ipratropium (ATROVENT) 0.06 % nasal spray Place 2 sprays into the nose 3 (three) times daily. 15 mL 2  . mycophenolate (CELLCEPT) 250 MG capsule Take 1,000 mg by mouth 2 (two) times daily.    Vladimir Faster Glycol-Propyl Glycol (LUBRICANT EYE DROPS) 0.4-0.3 % SOLN Place 1-2 drops into both eyes daily.    . potassium chloride (K-DUR) 10 MEQ tablet Take 1 tablet (10 mEq total) by mouth 2 (two) times daily. 60 tablet 5  . predniSONE (DELTASONE) 10 MG tablet Take 30 mg by mouth daily with breakfast.     . pyridostigmine (MESTINON) 60 MG tablet One in AM , one at lunch, 2 at bedtime. (Patient taking differently: One in AM and  one at lunch) 450 tablet 3  . tamsulosin (FLOMAX) 0.4 MG CAPS capsule TAKE 2 CAPSULES BY MOUTH  DAILY 180 capsule 3  . ranitidine (ZANTAC) 150 MG tablet Take 1 tablet (150 mg total) by mouth 2 (two) times daily. (Patient taking differently: Take 150 mg by mouth at bedtime. ) 180 tablet 3   No facility-administered medications prior to  visit.     ROS: Review of Systems  Constitutional: Negative for appetite change, fatigue and unexpected weight change.  HENT: Negative for congestion, nosebleeds, sneezing, sore throat and trouble swallowing.   Eyes: Negative for itching and visual disturbance.  Respiratory: Negative for cough.   Cardiovascular: Negative for chest pain, palpitations and leg swelling.  Gastrointestinal: Negative for abdominal distention, blood in stool, diarrhea and nausea.  Genitourinary: Negative for frequency and hematuria.  Musculoskeletal: Positive for gait problem. Negative for back pain, joint swelling and neck pain.  Skin: Negative for rash.  Neurological: Negative for dizziness, tremors, speech difficulty and weakness.  Psychiatric/Behavioral: Negative for agitation, dysphoric mood, sleep disturbance and suicidal ideas. The patient is not nervous/anxious.     Objective:  BP 122/74 (BP Location: Left Arm, Patient Position: Sitting, Cuff Size: Large)   Pulse 78   Temp 98.2 F (36.8 C) (Oral)   Ht 6' (1.829 m)   Wt 245 lb (111.1 kg)   SpO2 98%   BMI 33.23 kg/m   BP Readings from Last 3 Encounters:  09/09/18 122/74  06/07/18 132/64  06/03/18 118/64    Wt Readings from Last 3 Encounters:  09/09/18 245 lb (111.1 kg)  06/07/18 231 lb (104.8 kg)  06/03/18 233 lb (105.7 kg)  Physical Exam Constitutional:      General: He is not in acute distress.    Appearance: He is well-developed.     Comments: NAD  Eyes:     Conjunctiva/sclera: Conjunctivae normal.     Pupils: Pupils are equal, round, and reactive to light.  Neck:     Musculoskeletal: Normal range of motion.     Thyroid: No thyromegaly.     Vascular: No JVD.  Cardiovascular:     Rate and Rhythm: Normal rate and regular rhythm.     Heart sounds: Normal heart sounds. No murmur. No friction rub. No gallop.   Pulmonary:     Effort: Pulmonary effort is normal. No respiratory distress.     Breath sounds: Normal breath sounds.  No wheezing or rales.  Chest:     Chest wall: No tenderness.  Abdominal:     General: Bowel sounds are normal. There is no distension.     Palpations: Abdomen is soft. There is no mass.     Tenderness: There is no abdominal tenderness. There is no guarding or rebound.  Musculoskeletal: Normal range of motion.        General: No tenderness.  Lymphadenopathy:     Cervical: No cervical adenopathy.  Skin:    General: Skin is warm and dry.     Findings: No rash.  Neurological:     Mental Status: He is alert and oriented to person, place, and time.     Cranial Nerves: No cranial nerve deficit.     Motor: No abnormal muscle tone.     Coordination: Coordination normal.     Gait: Gait normal.     Deep Tendon Reflexes: Reflexes are normal and symmetric.  Psychiatric:        Behavior: Behavior normal.        Thought Content: Thought content normal.        Judgment: Judgment normal.     Lab Results  Component Value Date   WBC 8.2 06/03/2018   HGB 11.1 (L) 06/03/2018   HCT 34.5 (L) 06/03/2018   PLT 254.0 06/03/2018   GLUCOSE 96 06/03/2018   CHOL 182 12/01/2017   TRIG 174 (H) 12/01/2017   HDL 48 12/01/2017   LDLDIRECT 157.5 07/05/2008   LDLCALC 99 12/01/2017   ALT 21 06/03/2018   AST 12 06/03/2018   NA 140 06/03/2018   K 4.6 06/03/2018   CL 103 06/03/2018   CREATININE 0.71 06/03/2018   BUN 19 06/03/2018   CO2 32 06/03/2018   TSH 0.249 (L) 12/03/2017   PSA 0.49 03/01/2012   INR 0.99 11/30/2017   HGBA1C 5.5 02/05/2018    No results found.  Assessment & Plan:   There are no diagnoses linked to this encounter.   No orders of the defined types were placed in this encounter.    Follow-up: No follow-ups on file.  [name], MD

## 2018-09-21 DIAGNOSIS — Z803 Family history of malignant neoplasm of breast: Secondary | ICD-10-CM | POA: Diagnosis not present

## 2018-09-21 DIAGNOSIS — Z96612 Presence of left artificial shoulder joint: Secondary | ICD-10-CM | POA: Diagnosis not present

## 2018-09-21 DIAGNOSIS — Z96652 Presence of left artificial knee joint: Secondary | ICD-10-CM | POA: Diagnosis not present

## 2018-09-21 DIAGNOSIS — G7000 Myasthenia gravis without (acute) exacerbation: Secondary | ICD-10-CM | POA: Diagnosis not present

## 2018-09-21 DIAGNOSIS — G4733 Obstructive sleep apnea (adult) (pediatric): Secondary | ICD-10-CM | POA: Diagnosis not present

## 2018-09-21 DIAGNOSIS — Z79899 Other long term (current) drug therapy: Secondary | ICD-10-CM | POA: Diagnosis not present

## 2018-09-21 DIAGNOSIS — D649 Anemia, unspecified: Secondary | ICD-10-CM | POA: Diagnosis not present

## 2018-09-21 DIAGNOSIS — Z5181 Encounter for therapeutic drug level monitoring: Secondary | ICD-10-CM | POA: Diagnosis not present

## 2018-09-21 DIAGNOSIS — Z7952 Long term (current) use of systemic steroids: Secondary | ICD-10-CM | POA: Diagnosis not present

## 2018-09-21 DIAGNOSIS — Z87891 Personal history of nicotine dependence: Secondary | ICD-10-CM | POA: Diagnosis not present

## 2018-09-21 DIAGNOSIS — Z8249 Family history of ischemic heart disease and other diseases of the circulatory system: Secondary | ICD-10-CM | POA: Diagnosis not present

## 2018-09-22 DIAGNOSIS — H04123 Dry eye syndrome of bilateral lacrimal glands: Secondary | ICD-10-CM | POA: Diagnosis not present

## 2018-09-22 DIAGNOSIS — H26492 Other secondary cataract, left eye: Secondary | ICD-10-CM | POA: Diagnosis not present

## 2018-09-22 DIAGNOSIS — Z961 Presence of intraocular lens: Secondary | ICD-10-CM | POA: Diagnosis not present

## 2018-09-22 DIAGNOSIS — G7000 Myasthenia gravis without (acute) exacerbation: Secondary | ICD-10-CM | POA: Diagnosis not present

## 2018-09-29 DIAGNOSIS — Z471 Aftercare following joint replacement surgery: Secondary | ICD-10-CM | POA: Diagnosis not present

## 2018-09-29 DIAGNOSIS — Z96652 Presence of left artificial knee joint: Secondary | ICD-10-CM | POA: Diagnosis not present

## 2018-10-01 DIAGNOSIS — D649 Anemia, unspecified: Secondary | ICD-10-CM | POA: Diagnosis not present

## 2018-10-19 MED ORDER — POTASSIUM CHLORIDE ER 10 MEQ PO TBCR: 10.0000 meq | EXTENDED_RELEASE_TABLET | Freq: Two times a day (BID) | ORAL | 5 refills | Status: DC

## 2018-11-08 DIAGNOSIS — G7000 Myasthenia gravis without (acute) exacerbation: Secondary | ICD-10-CM | POA: Diagnosis not present

## 2018-11-08 DIAGNOSIS — Z79899 Other long term (current) drug therapy: Secondary | ICD-10-CM | POA: Diagnosis not present

## 2018-11-08 DIAGNOSIS — G3184 Mild cognitive impairment, so stated: Secondary | ICD-10-CM | POA: Diagnosis not present

## 2018-11-16 DIAGNOSIS — G4733 Obstructive sleep apnea (adult) (pediatric): Secondary | ICD-10-CM | POA: Diagnosis not present

## 2018-12-07 ENCOUNTER — Encounter: Payer: Self-pay | Admitting: Hematology and Oncology

## 2018-12-07 DIAGNOSIS — G3184 Mild cognitive impairment, so stated: Secondary | ICD-10-CM | POA: Diagnosis not present

## 2018-12-13 DIAGNOSIS — G3184 Mild cognitive impairment, so stated: Secondary | ICD-10-CM | POA: Diagnosis not present

## 2018-12-13 DIAGNOSIS — G7000 Myasthenia gravis without (acute) exacerbation: Secondary | ICD-10-CM | POA: Diagnosis not present

## 2019-01-07 DIAGNOSIS — G7000 Myasthenia gravis without (acute) exacerbation: Secondary | ICD-10-CM | POA: Diagnosis not present

## 2019-01-10 ENCOUNTER — Telehealth: Payer: Self-pay | Admitting: Internal Medicine

## 2019-01-11 DIAGNOSIS — G4733 Obstructive sleep apnea (adult) (pediatric): Secondary | ICD-10-CM | POA: Diagnosis not present

## 2019-01-11 NOTE — Telephone Encounter (Signed)
error 

## 2019-01-12 ENCOUNTER — Other Ambulatory Visit: Payer: Self-pay | Admitting: Internal Medicine

## 2019-01-12 DIAGNOSIS — G7000 Myasthenia gravis without (acute) exacerbation: Secondary | ICD-10-CM

## 2019-01-13 ENCOUNTER — Ambulatory Visit (INDEPENDENT_AMBULATORY_CARE_PROVIDER_SITE_OTHER): Payer: Medicare Other | Admitting: Internal Medicine

## 2019-01-13 ENCOUNTER — Other Ambulatory Visit (INDEPENDENT_AMBULATORY_CARE_PROVIDER_SITE_OTHER): Payer: Medicare Other

## 2019-01-13 ENCOUNTER — Encounter: Payer: Self-pay | Admitting: Internal Medicine

## 2019-01-13 ENCOUNTER — Ambulatory Visit: Payer: Medicare Other

## 2019-01-13 DIAGNOSIS — I10 Essential (primary) hypertension: Secondary | ICD-10-CM | POA: Diagnosis not present

## 2019-01-13 DIAGNOSIS — G7000 Myasthenia gravis without (acute) exacerbation: Secondary | ICD-10-CM | POA: Diagnosis not present

## 2019-01-13 DIAGNOSIS — I2510 Atherosclerotic heart disease of native coronary artery without angina pectoris: Secondary | ICD-10-CM | POA: Diagnosis not present

## 2019-01-13 LAB — CBC WITH DIFFERENTIAL/PLATELET
Basophils Absolute: 0 10*3/uL (ref 0.0–0.1)
Basophils Relative: 0.5 % (ref 0.0–3.0)
Eosinophils Absolute: 0.1 10*3/uL (ref 0.0–0.7)
Eosinophils Relative: 1.1 % (ref 0.0–5.0)
HCT: 32 % — ABNORMAL LOW (ref 39.0–52.0)
Hemoglobin: 10.2 g/dL — ABNORMAL LOW (ref 13.0–17.0)
Lymphocytes Relative: 19.4 % (ref 12.0–46.0)
Lymphs Abs: 1.7 10*3/uL (ref 0.7–4.0)
MCHC: 31.9 g/dL (ref 30.0–36.0)
MCV: 83.4 fl (ref 78.0–100.0)
Monocytes Absolute: 1.2 10*3/uL — ABNORMAL HIGH (ref 0.1–1.0)
Monocytes Relative: 14.1 % — ABNORMAL HIGH (ref 3.0–12.0)
Neutro Abs: 5.6 10*3/uL (ref 1.4–7.7)
Neutrophils Relative %: 64.9 % (ref 43.0–77.0)
Platelets: 267 10*3/uL (ref 150.0–400.0)
RBC: 3.84 Mil/uL — ABNORMAL LOW (ref 4.22–5.81)
RDW: 17.8 % — ABNORMAL HIGH (ref 11.5–15.5)
WBC: 8.7 10*3/uL (ref 4.0–10.5)

## 2019-01-13 LAB — HEPATIC FUNCTION PANEL
ALT: 19 U/L (ref 0–53)
AST: 11 U/L (ref 0–37)
Albumin: 4 g/dL (ref 3.5–5.2)
Alkaline Phosphatase: 41 U/L (ref 39–117)
Bilirubin, Direct: 0.1 mg/dL (ref 0.0–0.3)
Total Bilirubin: 0.5 mg/dL (ref 0.2–1.2)
Total Protein: 6.1 g/dL (ref 6.0–8.3)

## 2019-01-13 LAB — BASIC METABOLIC PANEL
BUN: 23 mg/dL (ref 6–23)
CO2: 32 mEq/L (ref 19–32)
Calcium: 9.3 mg/dL (ref 8.4–10.5)
Chloride: 103 mEq/L (ref 96–112)
Creatinine, Ser: 0.83 mg/dL (ref 0.40–1.50)
GFR: 89.64 mL/min (ref >60.00)
Glucose, Bld: 82 mg/dL (ref 70–99)
Potassium: 3.7 mEq/L (ref 3.5–5.1)
Sodium: 141 mEq/L (ref 135–145)

## 2019-01-13 LAB — TSH: TSH: 0.83 u[IU]/mL (ref 0.35–4.50)

## 2019-01-13 MED ORDER — PREDNISONE 10 MG PO TABS: ORAL_TABLET | ORAL | 3 refills | Status: DC

## 2019-01-13 MED ORDER — FUROSEMIDE 20 MG PO TABS: ORAL_TABLET | ORAL | 3 refills | Status: DC

## 2019-01-13 NOTE — Assessment & Plan Note (Signed)
On Prednisone - dose is being decreased

## 2019-01-13 NOTE — Assessment & Plan Note (Signed)
  On diet  

## 2019-01-13 NOTE — Assessment & Plan Note (Signed)
ASA Simvastatin 

## 2019-01-13 NOTE — Progress Notes (Signed)
Virtual Visit via Video Note  I connected with [name] on 01/13/19 at  3:20 PM EDT by a video enabled telemedicine application and verified that I am speaking with the correct person using two identifiers.   I discussed the limitations of evaluation and management by telemedicine and the availability of in person appointments. The patient expressed understanding and agreed to proceed.  History of Present Illness: We need to follow-up on MG, memory issues, anemia f/u  There has been no runny nose, cough, chest pain, shortness of breath, abdominal pain, diarrhea, constipation, arthralgias, skin rashes.  Muscle weakness is better.  [name] gained weight on prednisone   Observations/Objective: The patient appears to be in no acute distress, looks well. Round face. [name] is helping.  Assessment and Plan:  See my Assessment and Plan. Follow Up Instructions:    I discussed the assessment and treatment plan with the patient. The patient was provided an opportunity to ask questions and all were answered. The patient agreed with the plan and demonstrated an understanding of the instructions.   The patient was advised to call back or seek an in-person evaluation if the symptoms worsen or if the condition fails to improve as anticipated.  I provided face-to-face time during this encounter. We were at different locations.   [name], MD

## 2019-01-25 DIAGNOSIS — G3184 Mild cognitive impairment, so stated: Secondary | ICD-10-CM | POA: Diagnosis not present

## 2019-02-02 IMAGING — CT CT ANGIO CHEST
2 of 8 series · 18 of 46 positions shown · IV contrast (OMNI)
Comparison: Abdominal CT 03/06/2014.

ADDENDUM:
The mediastinum on this exam is normal.  There is no Thymoma.

Verbal report of this addendum was given to Dr. [REDACTED].
CLINICAL DATA: PE suspected, intermediate probability, positive
D-dimer. Difficulty breathing when laying flat
EXAM:
CT ANGIOGRAPHY CHEST WITH CONTRAST
TECHNIQUE: Multidetector CT imaging of the chest was performed using the
standard protocol during bolus administration of intravenous
contrast. Multiplanar CT image reconstructions and MIPs were
obtained to evaluate the vascular anatomy.
CONTRAST:  100mL KHAL8P-ECZ IOPAMIDOL (KHAL8P-ECZ) INJECTION 76%

[Series 6: thins · axial · 0.81mm/px · z∈[+1160,+1406]mm · 15 of 272 slices shown]
[im 13/272  lung]
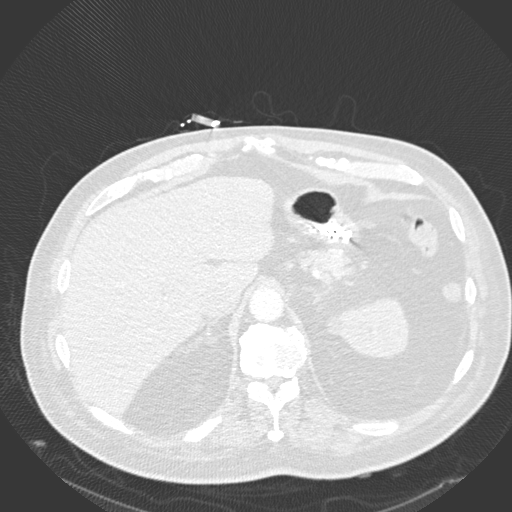
[im 37/272  soft-tissue]
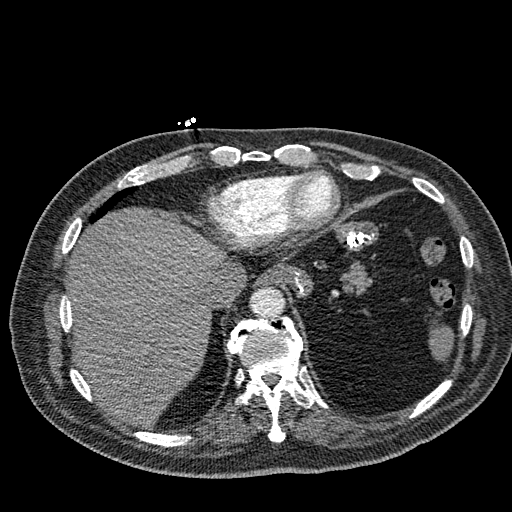
[im 50/272  lung]
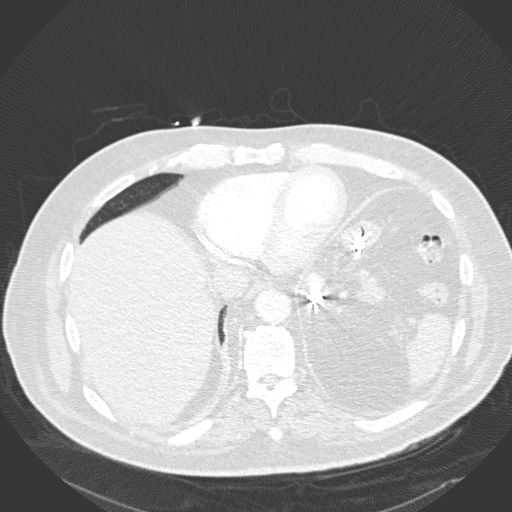
[im 62/272  soft-tissue]
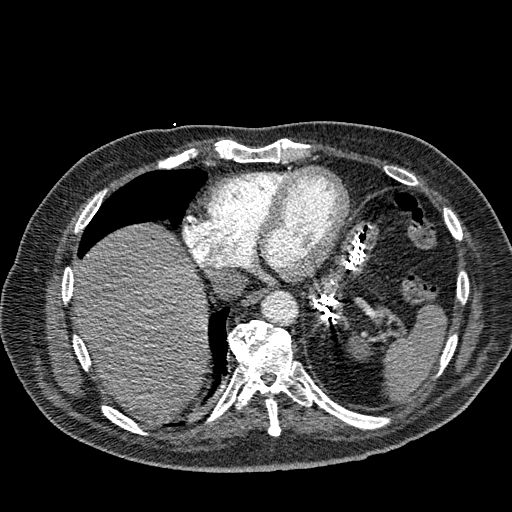
[im 87/272  lung]
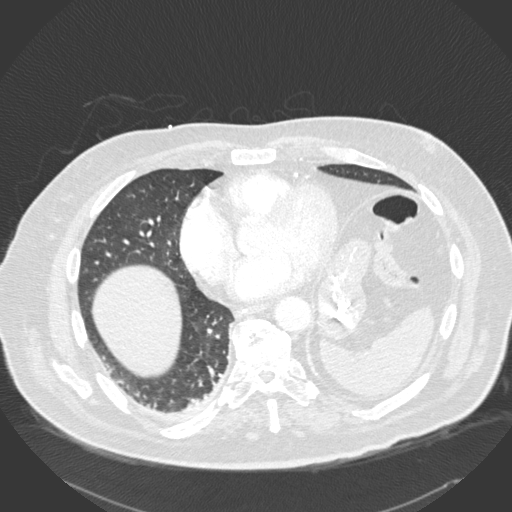
[im 99/272  soft-tissue]
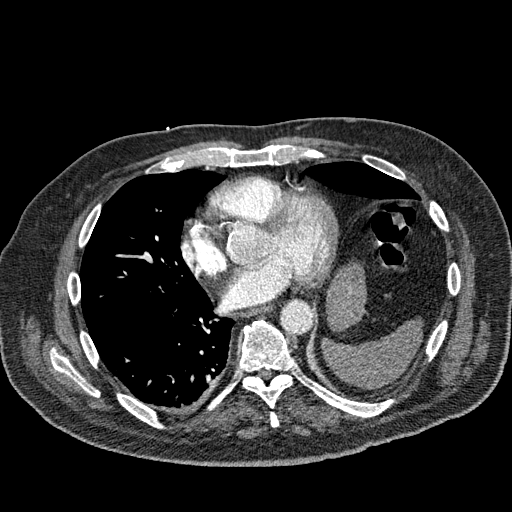
[im 124/272  lung]
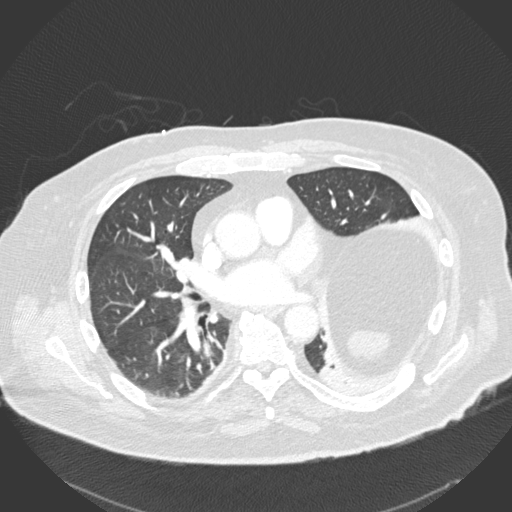
[im 136/272  soft-tissue]
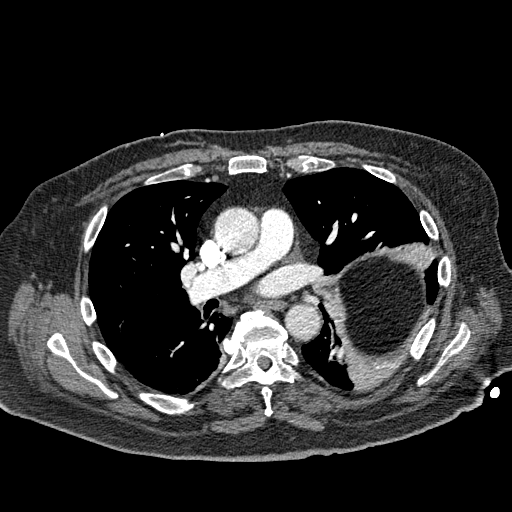
[im 148/272  lung]
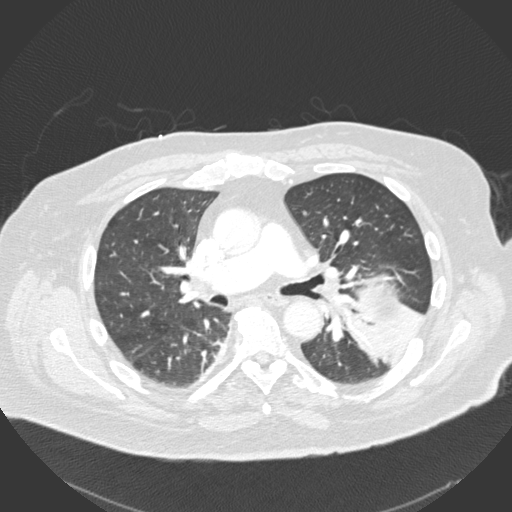
[im 173/272  soft-tissue]
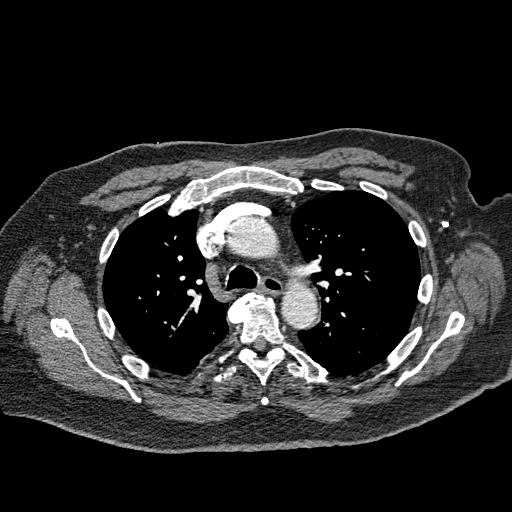
[im 185/272  lung]
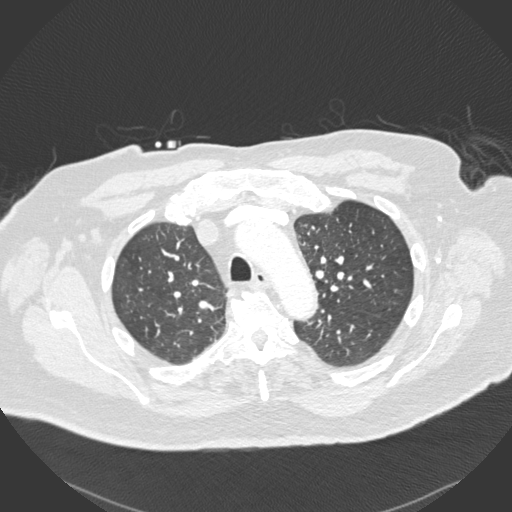
[im 210/272  soft-tissue]
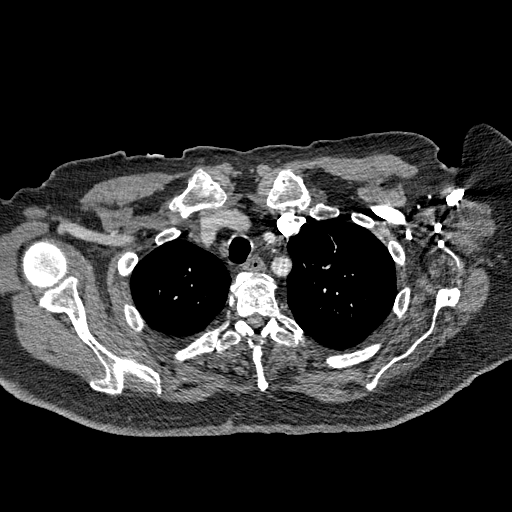
[im 222/272  lung]
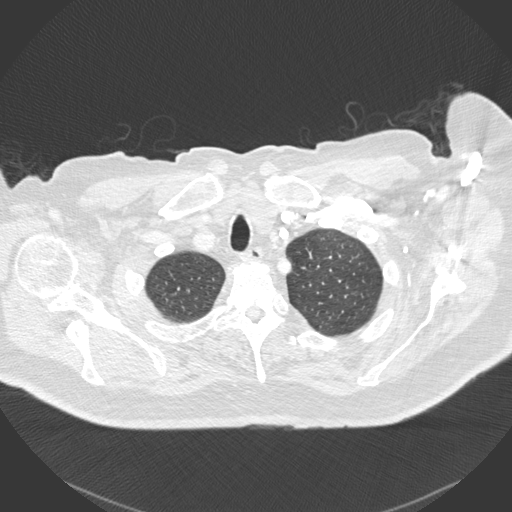
[im 235/272  soft-tissue]
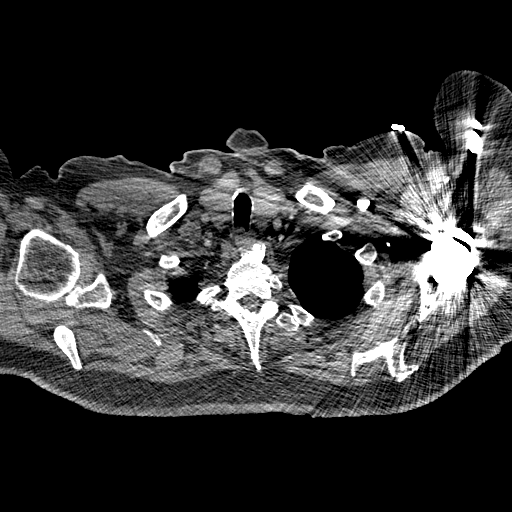
[im 259/272  lung]
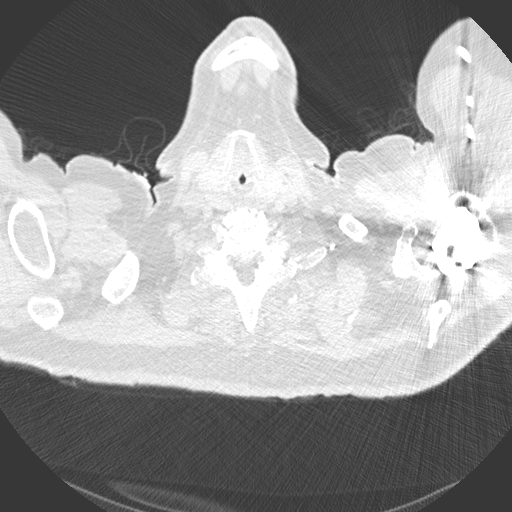

[Series 8: coronal mpr · coronal · 0.55mm/px · 3 of 123 slices shown]
[im 31/123  soft-tissue]
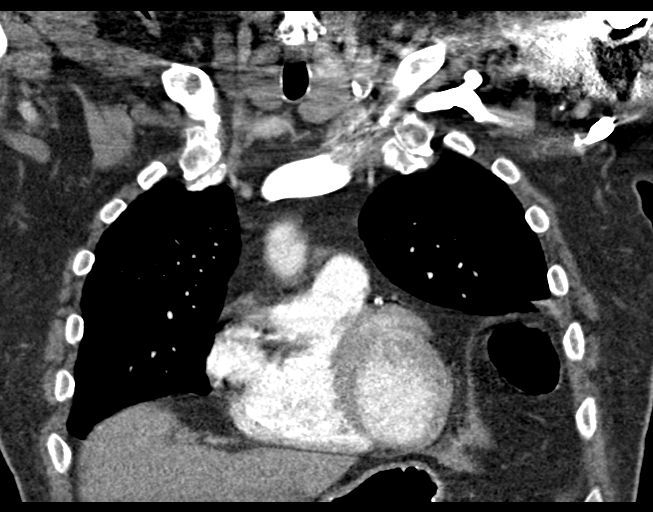
[im 62/123  soft-tissue]
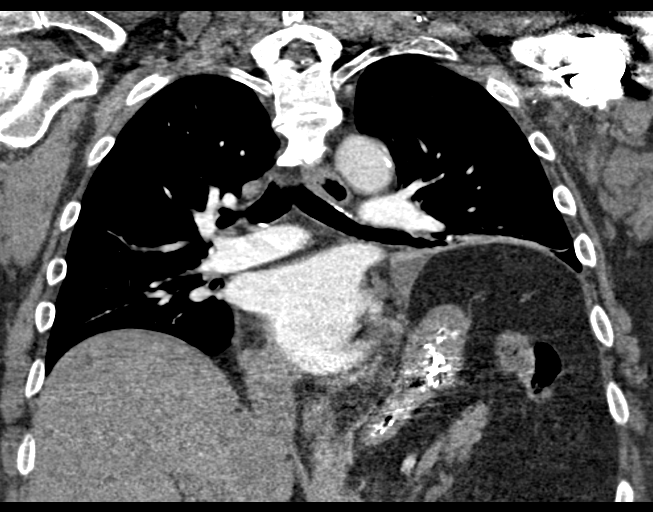
[im 92/123  soft-tissue]
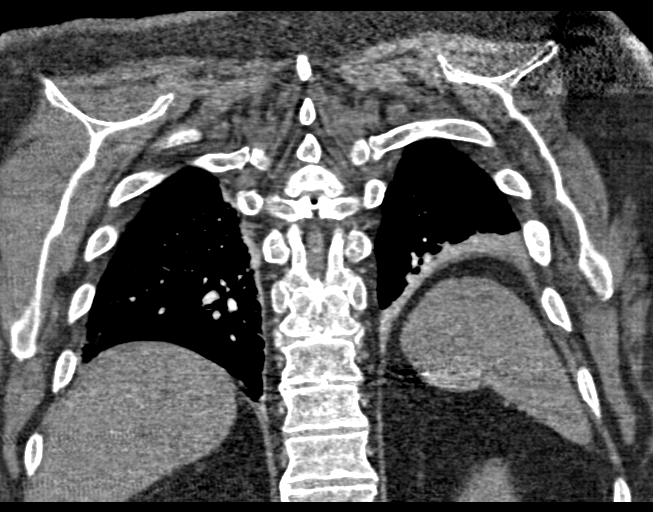

[18 of 46 positions shown; findings below may reference images not displayed]

FINDINGS: Cardiovascular: Satisfactory opacification of the pulmonary arteries
to the segmental level. No evidence of pulmonary embolism. Normal
heart size. No pericardial effusion. Diffuse coronary
atherosclerotic calcification. Aortic atherosclerotic calcification
is mild.

Mediastinum/Nodes: Negative for adenopathy.

Lungs/Pleura: Chronic elevation the left diaphragm with overlying
left lower lobe multi segment collapse. Mild atelectasis at the
right base. There is no edema, consolidation, effusion, or
pneumothorax.

Upper Abdomen: Barium in the stomach from recent esophagram. Small
left renal cyst.

Musculoskeletal: Extensive spondylosis with multilevel thoracic
ankylosis. Advanced cervical spine spondylosis and disc degeneration
where seen. Left glenohumeral arthroplasty. Right glenohumeral
osteoarthritis. No acute or aggressive finding.

Review of the MIP images confirms the above findings.
IMPRESSION: 1. Negative for pulmonary embolism or other acute finding.
2. Chronic elevation of the left diaphragm. There is multi segment
left lower lobe collapse than is greater than seen on 1453 abdominal
CT. Mild atelectasis at the right base.
3.  Aortic Atherosclerosis ([id number]).  Coronary atherosclerosis.
4. Spondylosis with multilevel thoracic ankylosis.

## 2019-02-03 DIAGNOSIS — G7000 Myasthenia gravis without (acute) exacerbation: Secondary | ICD-10-CM | POA: Diagnosis not present

## 2019-02-03 IMAGING — CT CT NECK W/ CM
4 of 5 series · 15 of 33 positions shown, 17 images · IV contrast (Omni 300)
Comparison: None.

CLINICAL DATA: Dysphagia, unexplained. Tongue swelling and
shortness of breath. Difficulty laying flat.

EXAM:
CT NECK WITH CONTRAST
TECHNIQUE: Multidetector CT imaging of the neck was performed using the
standard protocol following the bolus administration of intravenous
contrast.
CONTRAST:  75mL [id number]-IJJ IOPAMIDOL (8TZX6W-IJJ) INJECTION 61%

[Series 2: neck 2.0 st · axial · 0.58mm/px · z∈[-362,-244]mm · 4 of 99 slices shown, 5 images (1 of 3)]
[im 20/99  soft-tissue]
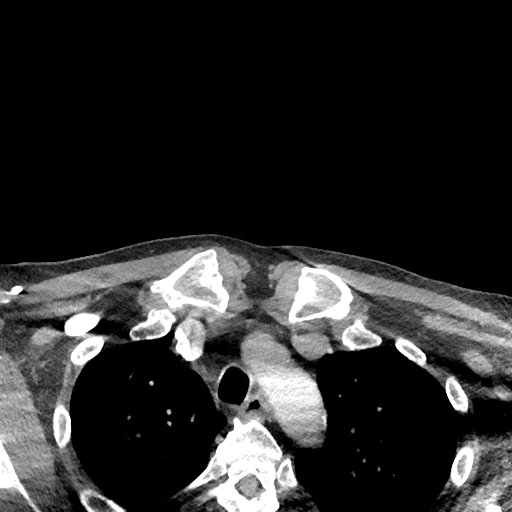
[im 20/99  bone]
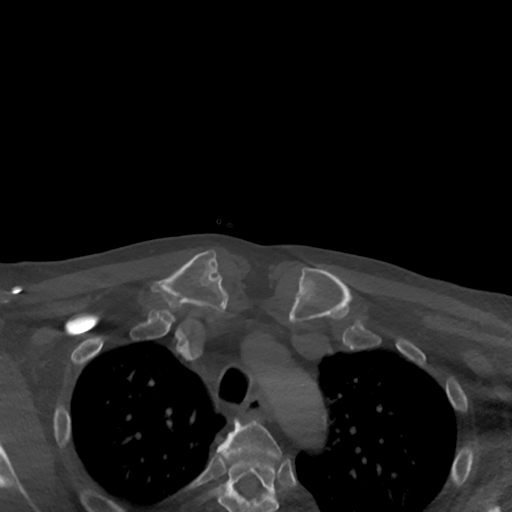
[im 40/99  bone]
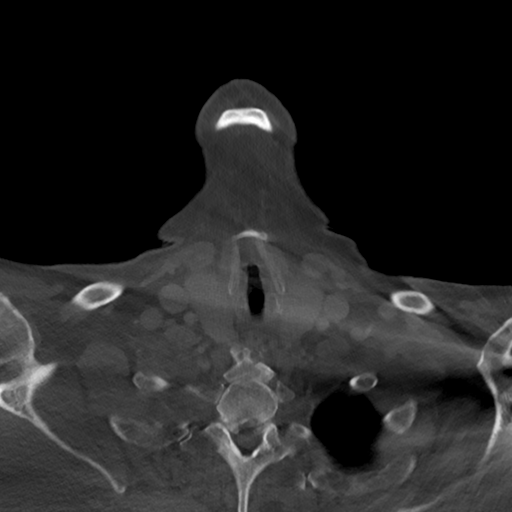
[im 59/99  bone]
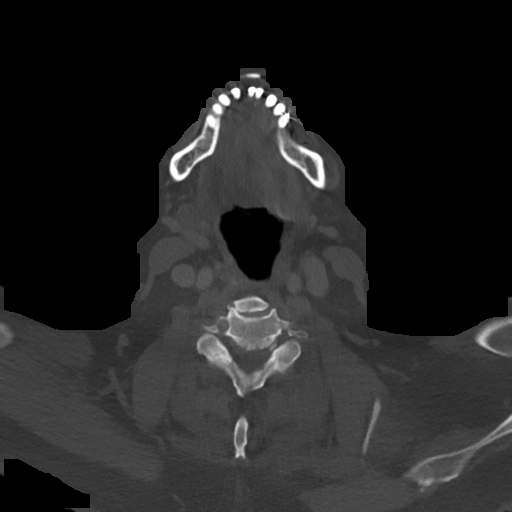
[im 79/99  bone]
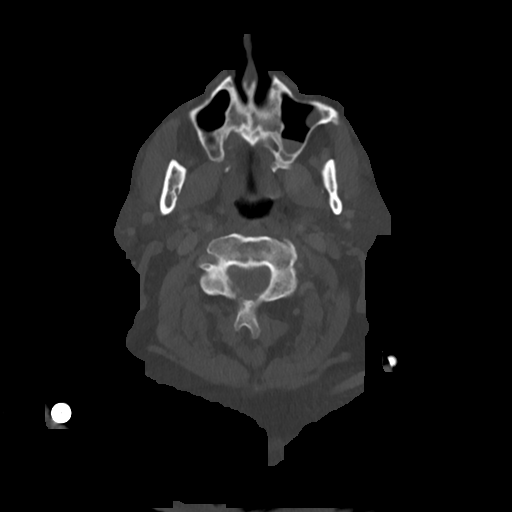

[Series 4: neck 2.0 st · sagittal · 0.38mm/px · 5 of 83 slices shown, 6 images (2 of 3)]
[im 28/83  bone]
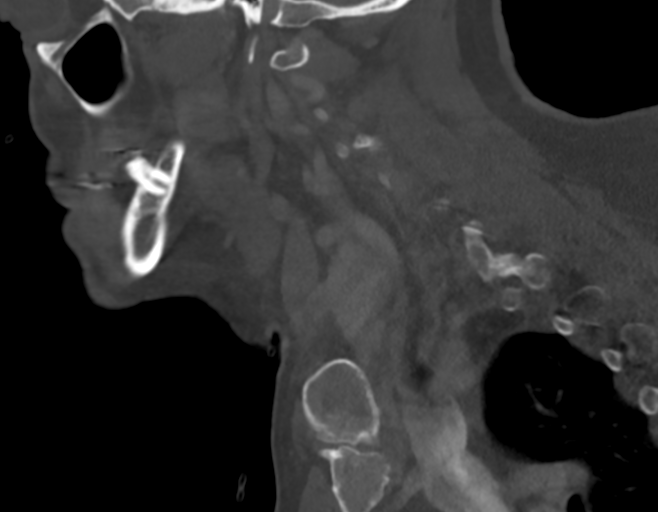
[im 35/83  bone]
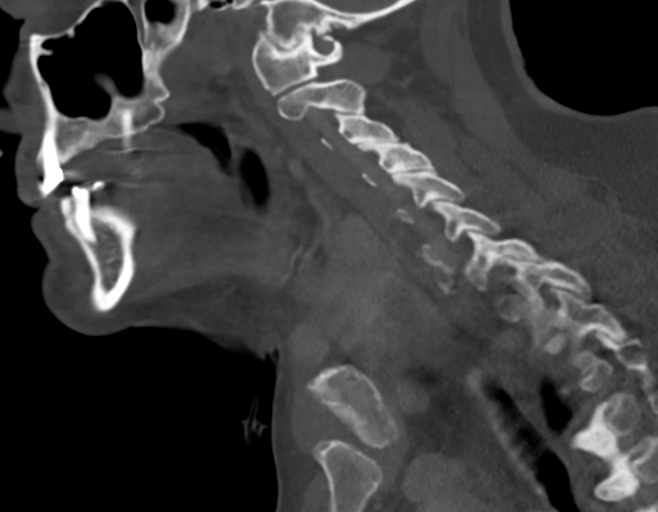
[im 42/83  soft-tissue]
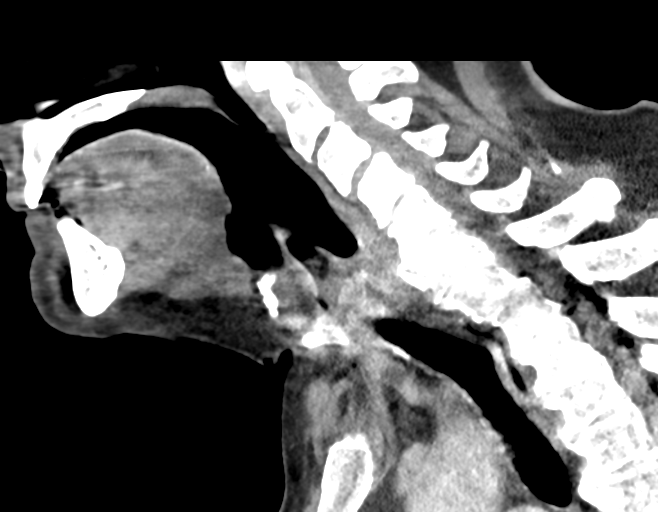
[im 42/83  bone]
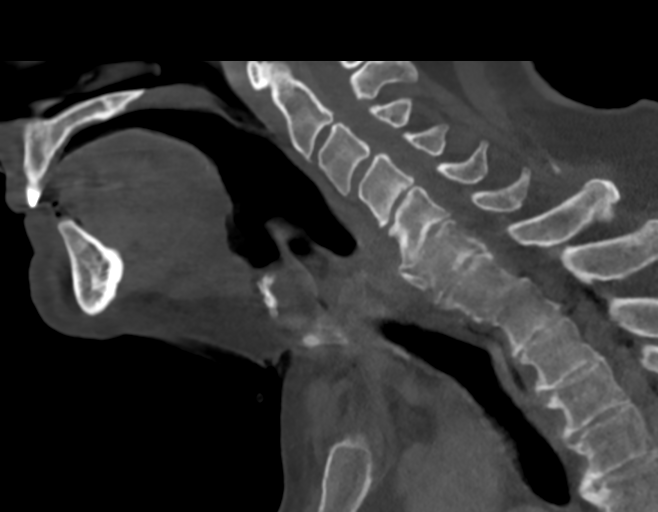
[im 48/83  bone]
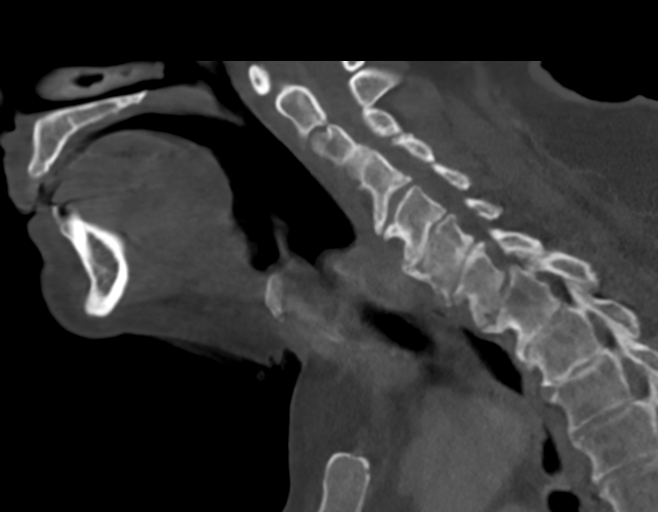
[im 55/83  bone]
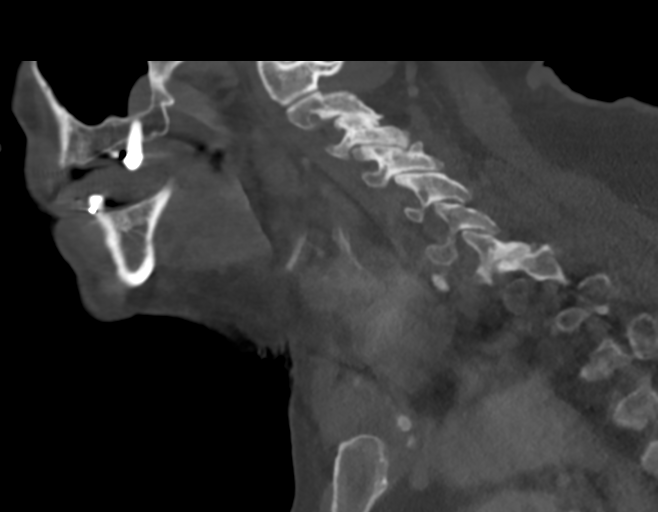

[Series 5: neck 2.0 st · coronal · 0.43mm/px · 3 of 101 slices shown (3 of 3)]
[im 31/101  bone]
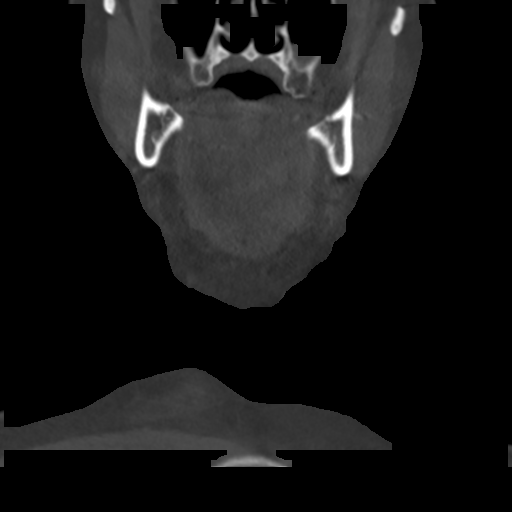
[im 44/101  bone]
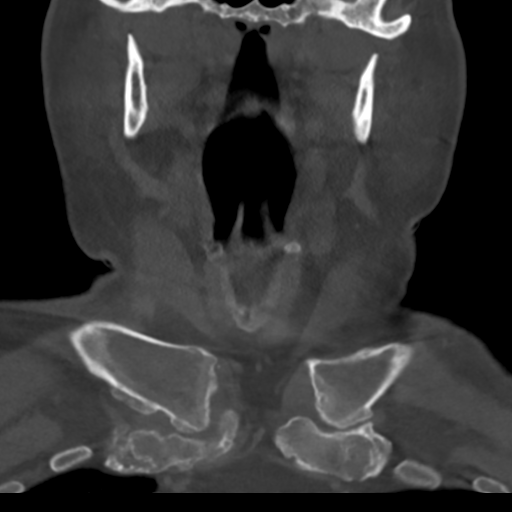
[im 57/101  bone]
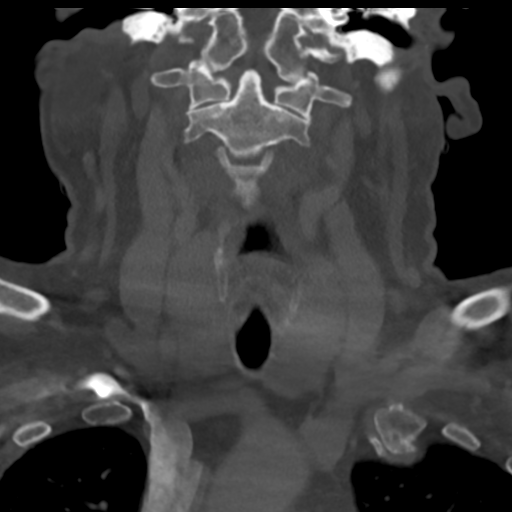

[Series 6: neck 2.0 st orthogonal · axial · 0.36mm/px · z∈[-416,-337]mm · 3 of 121 slices shown]
[im 25/121  bone]
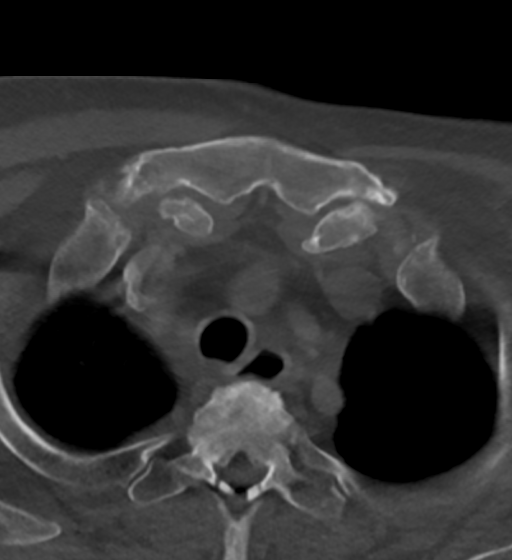
[im 49/121  bone]
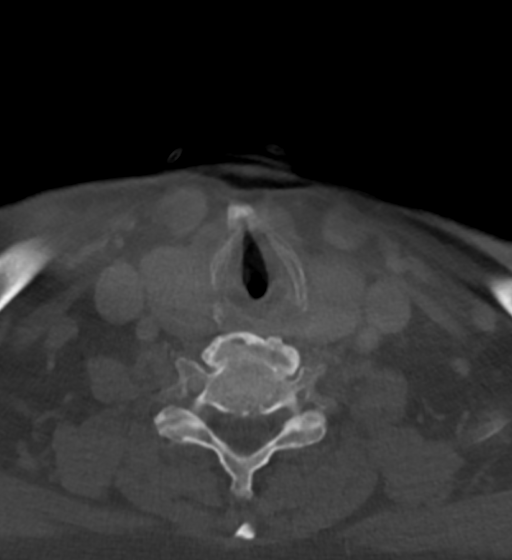
[im 73/121  bone]
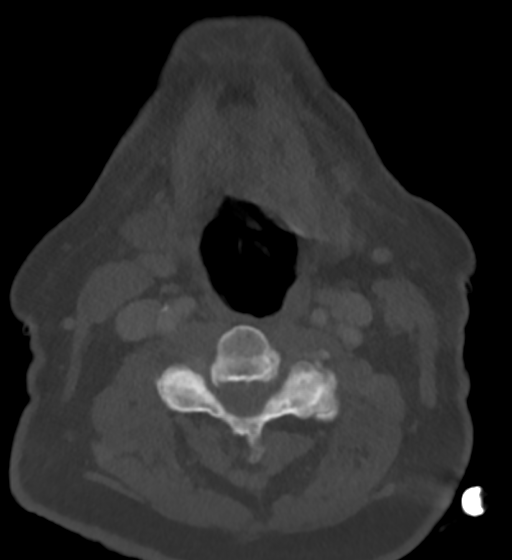

[15 of 33 positions shown; findings below may reference images not displayed]

FINDINGS: Pharynx and larynx: No visible swelling or mass to explain the
history. There is motion at the level of the supraglottic larynx.

Salivary glands: No inflammation, mass, or stone.

Thyroid: Generous size, the isthmus measures 13 mm in thickness. No
superimposed nodule.

Lymph nodes: None enlarged or abnormal density.

Vascular: Negative.  Mild atherosclerotic calcification.

Limited intracranial: Recent brain MRI. Limited coverage is negative
in the posterior fossa.

Visualized orbits: Negative

Mastoids and visualized paranasal sinuses: No acute finding

Skeleton: Advanced spondylosis with degenerative disc narrowing in
the lower cervical spine. Facet arthropathy with C3-4 and C7-T1
anterolisthesis.

Upper chest: Negative

Other: Motion degraded, which could obscure subtle findings.
IMPRESSION: Motion degraded study without acute finding or explanation for
symptoms.

## 2019-02-04 DIAGNOSIS — D500 Iron deficiency anemia secondary to blood loss (chronic): Secondary | ICD-10-CM | POA: Diagnosis not present

## 2019-02-10 ENCOUNTER — Other Ambulatory Visit: Payer: Self-pay

## 2019-02-10 MED ORDER — FAMOTIDINE 40 MG PO TABS: 40.0000 mg | ORAL_TABLET | Freq: Every day | ORAL | 3 refills | Status: DC

## 2019-02-10 MED ORDER — TAMSULOSIN HCL 0.4 MG PO CAPS: 0.8000 mg | ORAL_CAPSULE | Freq: Every day | ORAL | 3 refills | Status: DC

## 2019-02-10 MED ORDER — DULOXETINE HCL 60 MG PO CPEP: 60.0000 mg | ORAL_CAPSULE | Freq: Every day | ORAL | 1 refills | Status: DC

## 2019-02-15 DIAGNOSIS — G4733 Obstructive sleep apnea (adult) (pediatric): Secondary | ICD-10-CM | POA: Diagnosis not present

## 2019-02-16 ENCOUNTER — Telehealth: Payer: Self-pay | Admitting: *Deleted

## 2019-02-16 NOTE — Telephone Encounter (Signed)
Due to current COVID 19 pandemic, our office is severely reducing in office visits until further notice, in order to minimize the risk to our patients and healthcare providers.  Pt / wife understands that although there may be some limitations with this type of visit, we will take all precautions to reduce any security or privacy concerns.  Pt / wife understands that this will be treated like an in office visit and we will file with pt's insurance, and there may be a patient responsible charge related to this service.  Consented to Mychart [facility]V.  Email sent.

## 2019-02-17 ENCOUNTER — Other Ambulatory Visit: Payer: Self-pay

## 2019-02-17 ENCOUNTER — Telehealth (INDEPENDENT_AMBULATORY_CARE_PROVIDER_SITE_OTHER): Payer: Medicare Other | Admitting: Adult Health

## 2019-02-17 DIAGNOSIS — Z9989 Dependence on other enabling machines and devices: Secondary | ICD-10-CM | POA: Diagnosis not present

## 2019-02-17 DIAGNOSIS — G4733 Obstructive sleep apnea (adult) (pediatric): Secondary | ICD-10-CM | POA: Diagnosis not present

## 2019-02-17 NOTE — Progress Notes (Signed)
PATIENT: [name] DOB: [birth year]  REASON FOR VISIT: follow up HISTORY FROM: patient  Virtual Visit via Video Note  I connected with [name] on 02/17/19 at  2:00 PM EDT by a video enabled telemedicine application located remotely at [facility] and verified that I am speaking with the correct person using two identifiers who was located at their own home.   I discussed the limitations of evaluation and management by telemedicine and the availability of in person appointments. The patient expressed understanding and agreed to proceed.   PATIENT: [name] DOB: 06/21/1941  REASON FOR VISIT: follow up HISTORY FROM: patient  HISTORY OF PRESENT ILLNESS: Today 02/17/19:  [name] is a 78 year old male with a history of OSA on CPAP.  His CPAP download indicates that he uses machine 28 out of 30 days for compliance of 93%.  He uses machine greater than 4 hours 24 days for compliance of 80%.  On average he uses his machine 5 hours and 25 minutes.  His residual AHI is 5.6 on 7 to 13 cm of water with EPR of 3.  His leak in the 95th percentile is 20.9 L/min.  He reports that he has made a conscious effort to try to use the CPAP more often.  He is following with a [facility] neurologist for myasthenia gravis and memory disturbance.  He joins me today for virtual visit.  REVIEW OF SYSTEMS: Out of a complete 14 system review of symptoms, the patient complains only of the following symptoms, and all other reviewed systems are negative.   See HPI  ALLERGIES: No Known Allergies  HOME MEDICATIONS: Outpatient Medications Prior to Visit  Medication Sig Dispense Refill  . Cholecalciferol (VITAMIN D3) 2000 units TABS Take 2,000 Units by mouth at bedtime.    . Coenzyme Q10 (CO Q-10 PO) Take 1 tablet by mouth at bedtime.    . Cyanocobalamin (VITAMIN B-12) 1000 MCG SUBL Place 1 tablet (1,000 mcg total) under the tongue daily. (Patient taking  differently: Place 1,000 mcg under the tongue at bedtime. ) 100 tablet 3  . DULoxetine (CYMBALTA) 60 MG capsule Take 1 capsule (60 mg total) by mouth daily. 90 capsule 1  . famotidine (PEPCID) 40 MG tablet Take 1 tablet (40 mg total) by mouth daily. 90 tablet 3  . finasteride (PROSCAR) 5 MG tablet TAKE 1 TABLET BY MOUTH  DAILY 90 tablet 3  . furosemide (LASIX) 20 MG tablet TAKE 1 -2 TABLET (20 MG) BY  MOUTH DAILY IN THE EVENING 90 tablet 3  . ipratropium (ATROVENT) 0.06 % nasal spray Place 2 sprays into the nose 3 (three) times daily. 15 mL 2  . mycophenolate (CELLCEPT) 250 MG capsule Take 1,000 mg by mouth 2 (two) times daily.    Vladimir Faster Glycol-Propyl Glycol (LUBRICANT EYE DROPS) 0.4-0.3 % SOLN Place 1-2 drops into both eyes daily.    . potassium chloride (K-DUR) 10 MEQ tablet Take 1 tablet (10 mEq total) by mouth 2 (two) times daily. 60 tablet 5  . predniSONE (DELTASONE) 10 MG tablet 25 mg qod 20 mg qod 100 tablet 3  . pyridostigmine (MESTINON) 60 MG tablet One in AM , one at lunch, 2 at bedtime. (Patient taking differently: One in AM and  one at lunch) 450 tablet 3  . tamsulosin (FLOMAX) 0.4 MG CAPS capsule Take 2 capsules (0.8 mg total) by mouth daily. 180 capsule 3   No facility-administered medications prior to visit.  PAST MEDICAL HISTORY: Past Medical History:  Diagnosis Date  . Anemia   . Blood transfusion without reported diagnosis 02/23/2014   3 units for iron deficiency anemia. as a baby - whenhe had pneumonia  . BPH (benign prostatic hyperplasia)   . CTS (carpal tunnel syndrome)    better  . Dyspnea   . GERD (gastroesophageal reflux disease)   . Hyperlipidemia   . Hypertension    "THEY DIAGNOSED ME YEARS AGO BUT LATELY IVE ACTUALLY HAD LOW BLOOD PRESSURES "   . Insomnia   . LBP (low back pain)   . Osteoarthritis   . Pneumonia    as a baby  . Sleep apnea    no cpap    PAST SURGICAL HISTORY: Past Surgical History:  Procedure Laterality Date  . CARDIAC  CATHETERIZATION N/A 08/15/2015   Procedure: Left Heart Cath and Coronary Angiography;  Surgeon: Lorretta Harp, MD;  Location: [place] [facility];  Service: Cardiovascular;  Laterality: N/A;  . CARPAL TUNNEL RELEASE Bilateral 1996, 2004   Dr Lenoard Aden thumb surgery.  more than once  . COLONOSCOPY  2005, 2012   diverticulosis.   Marland Kitchen ESOPHAGOGASTRODUODENOSCOPY N/A 02/23/2014   Procedure: ESOPHAGOGASTRODUODENOSCOPY (EGD);  Surgeon: Irene Shipper, MD;  [facility];  Service: Endoscopy;  Laterality: N/A;  . EYE SURGERY  2012   both cataracts, Lasik  . LEFT KNEE ARTHROSCOPY Left 07/16/2017    [facility]   . LUMBAR FUSION     x 3  . REVERSE SHOULDER ARTHROPLASTY Left 07/04/2016   Procedure: LEFT REVERSE SHOULDER ARTHROPLASTY;  Surgeon: Netta Cedars, MD;  Location: [place];  Service: Orthopedics;  Laterality: Left;  . TONSILLECTOMY  1946  . TOTAL KNEE ARTHROPLASTY  2003   Right  . TOTAL KNEE ARTHROPLASTY Left 09/25/2017   Procedure: LEFT TOTAL KNEE ARTHROPLASTY;  Surgeon: Sydnee Cabal, MD;  Location: [facility];  Service: Orthopedics;  Laterality: Left;    FAMILY HISTORY: Family History  Problem Relation Age of Onset  . Aneurysm Father        AAA  . Heart disease Father 35       CHF  . Alzheimer's disease Father   . Cancer Brother   . Alzheimer's disease Brother        dementia  . Stomach cancer Brother   . Cancer Mother 33       breast cancer  . Heart disease Mother 33       MI  . Autoimmune disease Son   . CAD Brother   . Colon cancer Neg Hx   . Esophageal cancer Neg Hx   . Pancreatic cancer Neg Hx   . Prostate cancer Neg Hx   . Rectal cancer Neg Hx     SOCIAL HISTORY: Social History   Socioeconomic History  . Marital status: Married    Spouse name: Not on file  . Number of children: Not on file  . Years of education: Not on file  . Highest education level: Not on file  Occupational History  . Occupation: Retired    Fish farm manager: RETIRED    CommentDatabase administrator   Social Needs  . Financial resource strain: Not on file  . Food insecurity    Worry: Not on file    Inability: Not on file  . Transportation needs    Medical: Not on file    Non-medical: Not on file  Tobacco Use  . Smoking status: Former Smoker    Quit date: 01/08/1987  Years since quitting: 32.1  . Smokeless tobacco: Never Used  Substance and Sexual Activity  . Alcohol use: Yes    Alcohol/week: 0.0 standard drinks    Comment: rare  . Drug use: No  . Sexual activity: Not Currently  Lifestyle  . Physical activity    Days per week: Not on file    Minutes per session: Not on file  . Stress: Not on file  Relationships  . Social Herbalist on phone: Not on file    Gets together: Not on file    Attends religious service: Not on file    Active member of club or organization: Not on file    Attends meetings of clubs or organizations: Not on file    Relationship status: Not on file  . Intimate partner violence    Fear of current or ex partner: Not on file    Emotionally abused: Not on file    Physically abused: Not on file    Forced sexual activity: Not on file  Other Topics Concern  . Not on file  Social History Narrative   He lives with wife in a 2 story home.  Has 2 children.   Retired Dealer.   Highest level of education:  High school   Regular Exercise -  YES      PHYSICAL EXAM Generalized: Well developed, in no acute distress   Neurological examination  Mentation: Alert oriented to time, place, history taking. Follows all commands speech and language fluent Cranial nerve II-XII:Extraocular movements were full. Facial symmetry noted. uvula tongue midline. Head turning and shoulder shrug  were normal and symmetric. Motor: Good strength throughout subjectively per patient Sensory: Sensory testing is intact to soft touch on all 4 extremities subjectively per patient Coordination: Cerebellar testing reveals good finger-nose-finger  Reflexes: UTA   DIAGNOSTIC DATA (LABS, IMAGING, TESTING) - I reviewed patient records, labs, notes, testing and imaging myself where available.  Lab Results  Component Value Date   WBC 8.7 01/13/2019   HGB 10.2 (L) 01/13/2019   HCT 32.0 (L) 01/13/2019   MCV 83.4 01/13/2019   PLT 267.0 01/13/2019      Component Value Date/Time   NA 141 01/13/2019 1117   NA 144 02/05/2018 1205   NA 140 05/16/2014 1327   K 3.7 01/13/2019 1117   K 3.8 05/16/2014 1327   CL 103 01/13/2019 1117   CO2 32 01/13/2019 1117   CO2 28 05/16/2014 1327   GLUCOSE 82 01/13/2019 1117   GLUCOSE 98 05/16/2014 1327   BUN 23 01/13/2019 1117   BUN 11 02/05/2018 1205   BUN 17.6 05/16/2014 1327   CREATININE 0.83 01/13/2019 1117   CREATININE 0.82 08/13/2015 0953   CREATININE 0.9 05/16/2014 1327   CALCIUM 9.3 01/13/2019 1117   CALCIUM 9.4 05/16/2014 1327   PROT 6.1 01/13/2019 1117   PROT 6.0 02/05/2018 1205   PROT 6.9 05/16/2014 1327   ALBUMIN 4.0 01/13/2019 1117   ALBUMIN 4.4 02/05/2018 1205   ALBUMIN 3.7 05/16/2014 1327   AST 11 01/13/2019 1117   AST 19 05/16/2014 1327   ALT 19 01/13/2019 1117   ALT 9 05/16/2014 1327   ALKPHOS 41 01/13/2019 1117   ALKPHOS 66 05/16/2014 1327   BILITOT 0.5 01/13/2019 1117   BILITOT 0.4 02/05/2018 1205   BILITOT 0.50 05/16/2014 1327   GFRNONAA 95 02/05/2018 1205   GFRNONAA 87 08/13/2015 0953   GFRAA 109 02/05/2018 1205   GFRAA >89 08/13/2015 2992  Lab Results  Component Value Date   CHOL 182 12/01/2017   HDL 48 12/01/2017   LDLCALC 99 12/01/2017   LDLDIRECT 157.5 07/05/2008   TRIG 174 (H) 12/01/2017   CHOLHDL 3.8 12/01/2017   Lab Results  Component Value Date   HGBA1C 5.5 02/05/2018   Lab Results  Component Value Date   VITAMINB12 1,079 (H) 12/03/2017   Lab Results  Component Value Date   TSH 0.83 01/13/2019      ASSESSMENT AND PLAN 78 y.o. year old male  has a past medical history of Anemia, Blood transfusion without reported diagnosis (02/23/2014), BPH (benign  prostatic hyperplasia), CTS (carpal tunnel syndrome), Dyspnea, GERD (gastroesophageal reflux disease), Hyperlipidemia, Hypertension, Insomnia, LBP (low back pain), Osteoarthritis, Pneumonia, and Sleep apnea. here with:  1. OSA on CPAP  The patient's CPAP download shows excellent compliance and his AHI is slightly elevated.  I will increase his pressure to 7 to 14 cm of water.  He is encouraged to continue using CPAP nightly and greater than 4 hours each night.  He is advised that if his symptoms worsen or he develops new symptoms he should let us know.  He will follow-up in 6 months or sooner if needed.    I spent 15 minutes with the patient this time was spent reviewing his CPAP download.   Ward Givens, MSN, NP-C 02/17/2019, 2:27 PM [facility] [address], [place], [place] [address] [phone]

## 2019-02-21 NOTE — Progress Notes (Signed)
CMM received by [facility] for new cpap orders.02-18-19 sy

## 2019-03-08 DIAGNOSIS — R7303 Prediabetes: Secondary | ICD-10-CM | POA: Diagnosis not present

## 2019-03-08 DIAGNOSIS — D649 Anemia, unspecified: Secondary | ICD-10-CM | POA: Diagnosis not present

## 2019-03-08 DIAGNOSIS — G3184 Mild cognitive impairment, so stated: Secondary | ICD-10-CM | POA: Diagnosis not present

## 2019-03-08 DIAGNOSIS — Z01818 Encounter for other preprocedural examination: Secondary | ICD-10-CM | POA: Diagnosis not present

## 2019-03-08 DIAGNOSIS — D500 Iron deficiency anemia secondary to blood loss (chronic): Secondary | ICD-10-CM | POA: Diagnosis not present

## 2019-03-08 DIAGNOSIS — Z5181 Encounter for therapeutic drug level monitoring: Secondary | ICD-10-CM | POA: Diagnosis not present

## 2019-03-08 DIAGNOSIS — G7000 Myasthenia gravis without (acute) exacerbation: Secondary | ICD-10-CM | POA: Diagnosis not present

## 2019-03-11 DIAGNOSIS — G7000 Myasthenia gravis without (acute) exacerbation: Secondary | ICD-10-CM | POA: Diagnosis not present

## 2019-03-14 ENCOUNTER — Encounter: Payer: Self-pay | Admitting: Internal Medicine

## 2019-03-14 DIAGNOSIS — G7000 Myasthenia gravis without (acute) exacerbation: Secondary | ICD-10-CM | POA: Diagnosis not present

## 2019-03-14 DIAGNOSIS — G4733 Obstructive sleep apnea (adult) (pediatric): Secondary | ICD-10-CM | POA: Diagnosis not present

## 2019-03-14 DIAGNOSIS — R195 Other fecal abnormalities: Secondary | ICD-10-CM | POA: Diagnosis not present

## 2019-03-14 DIAGNOSIS — Z96653 Presence of artificial knee joint, bilateral: Secondary | ICD-10-CM | POA: Diagnosis not present

## 2019-03-14 DIAGNOSIS — D122 Benign neoplasm of ascending colon: Secondary | ICD-10-CM | POA: Diagnosis not present

## 2019-03-14 DIAGNOSIS — Z96612 Presence of left artificial shoulder joint: Secondary | ICD-10-CM | POA: Diagnosis not present

## 2019-03-14 DIAGNOSIS — K5730 Diverticulosis of large intestine without perforation or abscess without bleeding: Secondary | ICD-10-CM | POA: Diagnosis not present

## 2019-03-14 DIAGNOSIS — D126 Benign neoplasm of colon, unspecified: Secondary | ICD-10-CM | POA: Diagnosis not present

## 2019-03-14 DIAGNOSIS — K219 Gastro-esophageal reflux disease without esophagitis: Secondary | ICD-10-CM | POA: Diagnosis not present

## 2019-03-14 DIAGNOSIS — K3189 Other diseases of stomach and duodenum: Secondary | ICD-10-CM | POA: Diagnosis not present

## 2019-03-14 DIAGNOSIS — K635 Polyp of colon: Secondary | ICD-10-CM | POA: Diagnosis not present

## 2019-03-14 DIAGNOSIS — Z1159 Encounter for screening for other viral diseases: Secondary | ICD-10-CM | POA: Diagnosis not present

## 2019-03-14 DIAGNOSIS — D500 Iron deficiency anemia secondary to blood loss (chronic): Secondary | ICD-10-CM | POA: Diagnosis not present

## 2019-03-14 DIAGNOSIS — Z7982 Long term (current) use of aspirin: Secondary | ICD-10-CM | POA: Diagnosis not present

## 2019-03-14 DIAGNOSIS — D509 Iron deficiency anemia, unspecified: Secondary | ICD-10-CM | POA: Diagnosis not present

## 2019-03-14 DIAGNOSIS — Z87891 Personal history of nicotine dependence: Secondary | ICD-10-CM | POA: Diagnosis not present

## 2019-03-14 DIAGNOSIS — K648 Other hemorrhoids: Secondary | ICD-10-CM | POA: Diagnosis not present

## 2019-03-14 DIAGNOSIS — Z79899 Other long term (current) drug therapy: Secondary | ICD-10-CM | POA: Diagnosis not present

## 2019-03-17 DIAGNOSIS — D500 Iron deficiency anemia secondary to blood loss (chronic): Secondary | ICD-10-CM | POA: Diagnosis not present

## 2019-03-22 DIAGNOSIS — D649 Anemia, unspecified: Secondary | ICD-10-CM | POA: Diagnosis not present

## 2019-04-07 DIAGNOSIS — D508 Other iron deficiency anemias: Secondary | ICD-10-CM | POA: Diagnosis not present

## 2019-04-14 ENCOUNTER — Telehealth: Payer: Self-pay | Admitting: Internal Medicine

## 2019-04-14 ENCOUNTER — Other Ambulatory Visit (INDEPENDENT_AMBULATORY_CARE_PROVIDER_SITE_OTHER): Payer: Medicare Other

## 2019-04-14 DIAGNOSIS — R410 Disorientation, unspecified: Secondary | ICD-10-CM | POA: Diagnosis not present

## 2019-04-14 DIAGNOSIS — E876 Hypokalemia: Secondary | ICD-10-CM

## 2019-04-14 DIAGNOSIS — I10 Essential (primary) hypertension: Secondary | ICD-10-CM | POA: Diagnosis not present

## 2019-04-14 DIAGNOSIS — D509 Iron deficiency anemia, unspecified: Secondary | ICD-10-CM | POA: Diagnosis not present

## 2019-04-14 LAB — URINALYSIS
Bilirubin Urine: NEGATIVE
Hgb urine dipstick: NEGATIVE
Ketones, ur: NEGATIVE
Leukocytes,Ua: NEGATIVE
Nitrite: NEGATIVE
Specific Gravity, Urine: 1.025 (ref 1.000–1.030)
Total Protein, Urine: NEGATIVE
Urine Glucose: NEGATIVE
Urobilinogen, UA: 0.2 (ref 0.0–1.0)
pH: 6 (ref 5.0–8.0)

## 2019-04-14 LAB — CBC WITH DIFFERENTIAL/PLATELET
Basophils Absolute: 0 10*3/uL (ref 0.0–0.1)
Basophils Relative: 0.5 % (ref 0.0–3.0)
Eosinophils Absolute: 0.1 10*3/uL (ref 0.0–0.7)
Eosinophils Relative: 1.2 % (ref 0.0–5.0)
HCT: 32.7 % — ABNORMAL LOW (ref 39.0–52.0)
Hemoglobin: 9.8 g/dL — ABNORMAL LOW (ref 13.0–17.0)
Lymphocytes Relative: 19.4 % (ref 12.0–46.0)
Lymphs Abs: 1.8 10*3/uL (ref 0.7–4.0)
MCHC: 30 g/dL (ref 30.0–36.0)
MCV: 78.6 fl (ref 78.0–100.0)
Monocytes Absolute: 0.9 10*3/uL (ref 0.1–1.0)
Monocytes Relative: 9.7 % (ref 3.0–12.0)
Neutro Abs: 6.5 10*3/uL (ref 1.4–7.7)
Neutrophils Relative %: 69.2 % (ref 43.0–77.0)
Platelets: 258 10*3/uL (ref 150.0–400.0)
RBC: 4.16 Mil/uL — ABNORMAL LOW (ref 4.22–5.81)
RDW: 20.2 % — ABNORMAL HIGH (ref 11.5–15.5)
WBC: 9.4 10*3/uL (ref 4.0–10.5)

## 2019-04-14 LAB — BASIC METABOLIC PANEL
BUN: 16 mg/dL (ref 6–23)
CO2: 30 mEq/L (ref 19–32)
Calcium: 9.6 mg/dL (ref 8.4–10.5)
Chloride: 104 mEq/L (ref 96–112)
Creatinine, Ser: 0.82 mg/dL (ref 0.40–1.50)
GFR: 90.85 mL/min (ref >60.00)
Glucose, Bld: 98 mg/dL (ref 70–99)
Potassium: 3.7 mEq/L (ref 3.5–5.1)
Sodium: 142 mEq/L (ref 135–145)

## 2019-04-14 LAB — IBC PANEL
Iron: 69 ug/dL (ref 42–165)
Saturation Ratios: 18.1 % — ABNORMAL LOW (ref 20.0–50.0)
Transferrin: 272 mg/dL (ref 212.0–360.0)

## 2019-04-14 NOTE — Telephone Encounter (Signed)
Called patient's wife for more information.  She states that [name] has been diagnosed with myasthenia gravis and causes him to have "allergic reactions" where he does not remember things. Patient had this happen on Sunday where he did not remember where he was and walked off. They ended up having to call the police. About an hour later he was fine, but did not remember anything that happened other than that the police were there.  She has been told that because of his condition and him being susceptible to these allergic reactions it will most likely happen again.  She called [facility] because he is now seen there due to this condition. They have a virtual visit scheduled on the 27th but they suggested that the patient be checked for a UTI thinking this could be the issue.   She would like to know if this is something Dr Alain Marion would do.   Would Dr Alain Marion be willing to see the patient today or order the urinalysis?

## 2019-04-14 NOTE — Telephone Encounter (Signed)
Patient's wife informed of below.  

## 2019-04-14 NOTE — Telephone Encounter (Signed)
Copied from [place] [id number]. Topic: General - Other >> Apr 13, 2019 12:26 PM Leward Quan A wrote: Reason for CRM: Patient wife called to schedule an appointment with Dr Alain Marion due to an allergic reaction that he had. Nothing available until 04/25/2019 but next appointment at [facility] is 04/26/2019 she would like a call back in reference to request a urinalysis from the patient. Patient is awaiting a call from a [place] or nurse of Dr Alain Marion Ph# [phone]

## 2019-04-14 NOTE — Telephone Encounter (Signed)
Pls check UA at the labe F/u w/[facility] Thx

## 2019-04-14 NOTE — Telephone Encounter (Signed)
Ok to order a UA and cx?

## 2019-04-15 ENCOUNTER — Other Ambulatory Visit: Payer: Self-pay | Admitting: Internal Medicine

## 2019-04-21 DIAGNOSIS — G7000 Myasthenia gravis without (acute) exacerbation: Secondary | ICD-10-CM | POA: Diagnosis not present

## 2019-04-25 DIAGNOSIS — H26492 Other secondary cataract, left eye: Secondary | ICD-10-CM | POA: Diagnosis not present

## 2019-04-25 DIAGNOSIS — H04123 Dry eye syndrome of bilateral lacrimal glands: Secondary | ICD-10-CM | POA: Diagnosis not present

## 2019-04-25 DIAGNOSIS — G7000 Myasthenia gravis without (acute) exacerbation: Secondary | ICD-10-CM | POA: Diagnosis not present

## 2019-04-25 DIAGNOSIS — Z961 Presence of intraocular lens: Secondary | ICD-10-CM | POA: Diagnosis not present

## 2019-04-26 DIAGNOSIS — D500 Iron deficiency anemia secondary to blood loss (chronic): Secondary | ICD-10-CM | POA: Diagnosis not present

## 2019-05-07 DIAGNOSIS — Z01812 Encounter for preprocedural laboratory examination: Secondary | ICD-10-CM | POA: Diagnosis not present

## 2019-05-10 ENCOUNTER — Ambulatory Visit: Payer: Medicare Other | Admitting: Internal Medicine

## 2019-05-10 DIAGNOSIS — Z7952 Long term (current) use of systemic steroids: Secondary | ICD-10-CM | POA: Diagnosis not present

## 2019-05-10 DIAGNOSIS — Z7982 Long term (current) use of aspirin: Secondary | ICD-10-CM | POA: Diagnosis not present

## 2019-05-10 DIAGNOSIS — Z79899 Other long term (current) drug therapy: Secondary | ICD-10-CM | POA: Diagnosis not present

## 2019-05-10 DIAGNOSIS — K5520 Angiodysplasia of colon without hemorrhage: Secondary | ICD-10-CM | POA: Diagnosis not present

## 2019-05-10 DIAGNOSIS — K5521 Angiodysplasia of colon with hemorrhage: Secondary | ICD-10-CM | POA: Diagnosis not present

## 2019-05-10 DIAGNOSIS — R933 Abnormal findings on diagnostic imaging of other parts of digestive tract: Secondary | ICD-10-CM | POA: Diagnosis not present

## 2019-05-10 DIAGNOSIS — D509 Iron deficiency anemia, unspecified: Secondary | ICD-10-CM | POA: Diagnosis not present

## 2019-05-10 DIAGNOSIS — G4733 Obstructive sleep apnea (adult) (pediatric): Secondary | ICD-10-CM | POA: Diagnosis not present

## 2019-05-10 DIAGNOSIS — Z96653 Presence of artificial knee joint, bilateral: Secondary | ICD-10-CM | POA: Diagnosis not present

## 2019-05-10 DIAGNOSIS — K31819 Angiodysplasia of stomach and duodenum without bleeding: Secondary | ICD-10-CM | POA: Diagnosis not present

## 2019-05-10 DIAGNOSIS — Z96612 Presence of left artificial shoulder joint: Secondary | ICD-10-CM | POA: Diagnosis not present

## 2019-05-10 DIAGNOSIS — K219 Gastro-esophageal reflux disease without esophagitis: Secondary | ICD-10-CM | POA: Diagnosis not present

## 2019-05-10 DIAGNOSIS — G7000 Myasthenia gravis without (acute) exacerbation: Secondary | ICD-10-CM | POA: Diagnosis not present

## 2019-05-23 ENCOUNTER — Other Ambulatory Visit: Payer: Self-pay

## 2019-05-23 ENCOUNTER — Ambulatory Visit (INDEPENDENT_AMBULATORY_CARE_PROVIDER_SITE_OTHER): Payer: Medicare Other | Admitting: Internal Medicine

## 2019-05-23 ENCOUNTER — Encounter: Payer: Self-pay | Admitting: Internal Medicine

## 2019-05-23 ENCOUNTER — Other Ambulatory Visit (INDEPENDENT_AMBULATORY_CARE_PROVIDER_SITE_OTHER): Payer: Medicare Other

## 2019-05-23 VITALS — BP 124/76 | HR 98 | Temp 97.6°F | Ht 72.0 in | Wt 249.0 lb

## 2019-05-23 DIAGNOSIS — E538 Deficiency of other specified B group vitamins: Secondary | ICD-10-CM

## 2019-05-23 DIAGNOSIS — G7000 Myasthenia gravis without (acute) exacerbation: Secondary | ICD-10-CM | POA: Diagnosis not present

## 2019-05-23 DIAGNOSIS — G3184 Mild cognitive impairment, so stated: Secondary | ICD-10-CM | POA: Diagnosis not present

## 2019-05-23 DIAGNOSIS — E559 Vitamin D deficiency, unspecified: Secondary | ICD-10-CM

## 2019-05-23 DIAGNOSIS — I10 Essential (primary) hypertension: Secondary | ICD-10-CM

## 2019-05-23 DIAGNOSIS — D500 Iron deficiency anemia secondary to blood loss (chronic): Secondary | ICD-10-CM

## 2019-05-23 DIAGNOSIS — N320 Bladder-neck obstruction: Secondary | ICD-10-CM | POA: Diagnosis not present

## 2019-05-23 DIAGNOSIS — R4589 Other symptoms and signs involving emotional state: Secondary | ICD-10-CM | POA: Insufficient documentation

## 2019-05-23 DIAGNOSIS — F329 Major depressive disorder, single episode, unspecified: Secondary | ICD-10-CM

## 2019-05-23 DIAGNOSIS — D509 Iron deficiency anemia, unspecified: Secondary | ICD-10-CM | POA: Diagnosis not present

## 2019-05-23 LAB — CBC WITH DIFFERENTIAL/PLATELET
Basophils Absolute: 0 10*3/uL (ref 0.0–0.1)
Basophils Relative: 0.4 % (ref 0.0–3.0)
Eosinophils Absolute: 0.1 10*3/uL (ref 0.0–0.7)
Eosinophils Relative: 0.9 % (ref 0.0–5.0)
HCT: 36.9 % — ABNORMAL LOW (ref 39.0–52.0)
Hemoglobin: 11.6 g/dL — ABNORMAL LOW (ref 13.0–17.0)
Lymphocytes Relative: 19.2 % (ref 12.0–46.0)
Lymphs Abs: 1.7 10*3/uL (ref 0.7–4.0)
MCHC: 31.5 g/dL (ref 30.0–36.0)
MCV: 85.2 fl (ref 78.0–100.0)
Monocytes Absolute: 1 10*3/uL (ref 0.1–1.0)
Monocytes Relative: 11.8 % (ref 3.0–12.0)
Neutro Abs: 6 10*3/uL (ref 1.4–7.7)
Neutrophils Relative %: 67.7 % (ref 43.0–77.0)
Platelets: 256 10*3/uL (ref 150.0–400.0)
RBC: 4.33 Mil/uL (ref 4.22–5.81)
RDW: 24.1 % — ABNORMAL HIGH (ref 11.5–15.5)
WBC: 8.8 10*3/uL (ref 4.0–10.5)

## 2019-05-23 LAB — IRON,TIBC AND FERRITIN PANEL
%SAT: 13 % (calc) — ABNORMAL LOW (ref 20–48)
Ferritin: 25 ng/mL (ref 24–380)
Iron: 43 ug/dL — ABNORMAL LOW (ref 50–180)
TIBC: 344 mcg/dL (calc) (ref 250–425)

## 2019-05-23 LAB — BASIC METABOLIC PANEL
BUN: 17 mg/dL (ref 6–23)
CO2: 32 mEq/L (ref 19–32)
Calcium: 10 mg/dL (ref 8.4–10.5)
Chloride: 104 mEq/L (ref 96–112)
Creatinine, Ser: 0.8 mg/dL (ref 0.40–1.50)
GFR: 93.45 mL/min (ref >60.00)
Glucose, Bld: 78 mg/dL (ref 70–99)
Potassium: 4.7 mEq/L (ref 3.5–5.1)
Sodium: 144 mEq/L (ref 135–145)

## 2019-05-23 LAB — VITAMIN D 25 HYDROXY (VIT D DEFICIENCY, FRACTURES): VITD: 40.97 ng/mL (ref 30.00–100.00)

## 2019-05-23 LAB — PSA: PSA: 0.25 ng/mL (ref 0.10–4.00)

## 2019-05-23 LAB — TSH: TSH: 1.17 u[IU]/mL (ref 0.35–4.50)

## 2019-05-23 LAB — VITAMIN B12: Vitamin B-12: 749 pg/mL (ref 211–911)

## 2019-05-23 MED ORDER — PAROXETINE HCL 20 MG PO TABS: 20.0000 mg | ORAL_TABLET | Freq: Every day | ORAL | 1 refills | Status: DC

## 2019-05-23 NOTE — Assessment & Plan Note (Signed)
Seeing a doctor at [facility]

## 2019-05-23 NOTE — Assessment & Plan Note (Signed)
Cellcept, Mestinon, Prednisone

## 2019-05-23 NOTE — Assessment & Plan Note (Signed)
On NAS diet  

## 2019-05-23 NOTE — Assessment & Plan Note (Signed)
S/p capsule endo at [facility]

## 2019-05-23 NOTE — Progress Notes (Signed)
Subjective:  Patient ID: [name], male    DOB: [birth year]  Age: 77 y.o. MRN: [id number]  CC: No chief complaint on file.   HPI [name] presents for MG, LBP, OA f/u C/o LE swelling L>>R x months C/o agitation at night  Outpatient Medications Prior to Visit  Medication Sig Dispense Refill  . Cholecalciferol (VITAMIN D3) 2000 units TABS Take 2,000 Units by mouth at bedtime.    . Coenzyme Q10 (CO Q-10 PO) Take 1 tablet by mouth at bedtime.    . Cyanocobalamin (VITAMIN B-12) 1000 MCG SUBL Place 1 tablet (1,000 mcg total) under the tongue daily. (Patient taking differently: Place 1,000 mcg under the tongue at bedtime. ) 100 tablet 3  . DULoxetine (CYMBALTA) 60 MG capsule Take 1 capsule (60 mg total) by mouth daily. 90 capsule 1  . famotidine (PEPCID) 40 MG tablet Take 1 tablet (40 mg total) by mouth daily. 90 tablet 3  . finasteride (PROSCAR) 5 MG tablet TAKE 1 TABLET BY MOUTH  DAILY 90 tablet 3  . furosemide (LASIX) 20 MG tablet TAKE 1 -2 TABLET (20 MG) BY  MOUTH DAILY IN THE EVENING 90 tablet 3  . ipratropium (ATROVENT) 0.06 % nasal spray Place 2 sprays into the nose 3 (three) times daily. 15 mL 2  . mycophenolate (CELLCEPT) 250 MG capsule Take 1,000 mg by mouth 2 (two) times daily.    [name] Glycol-Propyl Glycol (LUBRICANT EYE DROPS) 0.4-0.3 % SOLN Place 1-2 drops into both eyes daily.    . potassium chloride (K-DUR) 10 MEQ tablet TAKE 1 TABLET (10 MEQ TOTAL) BY MOUTH 2 (TWO) TIMES DAILY. 180 tablet 1  . predniSONE (DELTASONE) 10 MG tablet 25 mg qod 20 mg qod 100 tablet 3  . pyridostigmine (MESTINON) 60 MG tablet One in AM , one at lunch, 2 at bedtime. (Patient taking differently: One in AM and  one at lunch) 450 tablet 3  . tamsulosin (FLOMAX) 0.4 MG CAPS capsule Take 2 capsules (0.8 mg total) by mouth daily. 180 capsule 3   No facility-administered medications prior to visit.     ROS: Review of Systems  Constitutional: Positive for fatigue. Negative for  appetite change and unexpected weight change.  HENT: Negative for congestion, nosebleeds, sneezing, sore throat and trouble swallowing.   Eyes: Negative for itching and visual disturbance.  Respiratory: Negative for cough.   Cardiovascular: Positive for leg swelling. Negative for chest pain and palpitations.  Gastrointestinal: Negative for abdominal distention, blood in stool, diarrhea and nausea.  Genitourinary: Negative for frequency and hematuria.  Musculoskeletal: Positive for arthralgias and gait problem. Negative for back pain, joint swelling and neck pain.  Skin: Negative for rash.  Neurological: Negative for dizziness, tremors, speech difficulty and weakness.  Psychiatric/Behavioral: Negative for agitation, dysphoric mood, sleep disturbance and suicidal ideas. The patient is not nervous/anxious.     Objective:  BP 124/76 (BP Location: Right Arm, Patient Position: Sitting, Cuff Size: Large)   Pulse 98   Temp 97.6 F (36.4 C) (Oral)   Ht 6' (1.829 m)   Wt 249 lb (112.9 kg)   SpO2 97%   BMI 33.77 kg/m   BP Readings from Last 3 Encounters:  05/23/19 124/76  09/09/18 122/74  06/07/18 132/64    Wt Readings from Last 3 Encounters:  05/23/19 249 lb (112.9 kg)  09/09/18 245 lb (111.1 kg)  06/07/18 231 lb (104.8 kg)    Physical Exam Constitutional:      General: He is  not in acute distress.    Appearance: He is well-developed.     Comments: NAD  Eyes:     Conjunctiva/sclera: Conjunctivae normal.     Pupils: Pupils are equal, round, and reactive to light.  Neck:     Musculoskeletal: Normal range of motion.     Thyroid: No thyromegaly.     Vascular: No JVD.  Cardiovascular:     Rate and Rhythm: Normal rate and regular rhythm.     Heart sounds: Normal heart sounds. No murmur. No friction rub. No gallop.   Pulmonary:     Effort: Pulmonary effort is normal. No respiratory distress.     Breath sounds: Normal breath sounds. No wheezing or rales.  Chest:     Chest wall:  No tenderness.  Abdominal:     General: Bowel sounds are normal. There is no distension.     Palpations: Abdomen is soft. There is no mass.     Tenderness: There is no abdominal tenderness. There is no guarding or rebound.  Musculoskeletal: Normal range of motion.        General: No tenderness.     Right lower leg: Edema present.     Left lower leg: Edema present.  Lymphadenopathy:     Cervical: No cervical adenopathy.  Skin:    General: Skin is warm and dry.     Findings: No rash.  Neurological:     Mental Status: He is alert and oriented to person, place, and time.     Cranial Nerves: No cranial nerve deficit.     Motor: Weakness present. No abnormal muscle tone.     Coordination: Coordination normal.     Gait: Gait normal.     Deep Tendon Reflexes: Reflexes are normal and symmetric.  Psychiatric:        Behavior: Behavior normal.        Thought Content: Thought content normal.        Judgment: Judgment normal.   LLE 1-2+ edema R - trace Obese Weak LEs  Lab Results  Component Value Date   WBC 9.4 04/14/2019   HGB 9.8 (L) 04/14/2019   HCT 32.7 (L) 04/14/2019   PLT 258.0 04/14/2019   GLUCOSE 98 04/14/2019   CHOL 182 12/01/2017   TRIG 174 (H) 12/01/2017   HDL 48 12/01/2017   LDLDIRECT 157.5 07/05/2008   LDLCALC 99 12/01/2017   ALT 19 01/13/2019   AST 11 01/13/2019   NA 142 04/14/2019   K 3.7 04/14/2019   CL 104 04/14/2019   CREATININE 0.82 04/14/2019   BUN 16 04/14/2019   CO2 30 04/14/2019   TSH 0.83 01/13/2019   PSA 0.49 03/01/2012   INR 0.99 11/30/2017   HGBA1C 5.5 02/05/2018    No results found.  Assessment & Plan:   There are no diagnoses linked to this encounter.   No orders of the defined types were placed in this encounter.    Follow-up: No follow-ups on file.  [name], MD

## 2019-05-23 NOTE — Assessment & Plan Note (Signed)
On B12 

## 2019-05-23 NOTE — Assessment & Plan Note (Signed)
D/c Cymbalta; start Paxil

## 2019-06-02 DIAGNOSIS — G4733 Obstructive sleep apnea (adult) (pediatric): Secondary | ICD-10-CM | POA: Diagnosis not present

## 2019-06-14 DIAGNOSIS — G7000 Myasthenia gravis without (acute) exacerbation: Secondary | ICD-10-CM | POA: Diagnosis not present

## 2019-06-14 DIAGNOSIS — R0602 Shortness of breath: Secondary | ICD-10-CM | POA: Diagnosis not present

## 2019-06-14 DIAGNOSIS — Z5181 Encounter for therapeutic drug level monitoring: Secondary | ICD-10-CM | POA: Diagnosis not present

## 2019-06-30 ENCOUNTER — Ambulatory Visit: Payer: Medicare Other

## 2019-07-01 ENCOUNTER — Other Ambulatory Visit: Payer: Self-pay | Admitting: Internal Medicine

## 2019-07-05 ENCOUNTER — Other Ambulatory Visit: Payer: Self-pay

## 2019-07-05 ENCOUNTER — Encounter: Payer: Self-pay | Admitting: Physical Therapy

## 2019-07-05 ENCOUNTER — Ambulatory Visit: Payer: Medicare Other | Attending: Neurology | Admitting: Physical Therapy

## 2019-07-05 DIAGNOSIS — M6281 Muscle weakness (generalized): Secondary | ICD-10-CM

## 2019-07-05 DIAGNOSIS — R2681 Unsteadiness on feet: Secondary | ICD-10-CM | POA: Diagnosis not present

## 2019-07-05 NOTE — Patient Instructions (Signed)
Access Code: [email or web address]  URL: [email or web address]  Date: 07/05/2019  Prepared by: Sherol Dade   Exercises  Supine Single Knee to Chest Stretch - 10 reps - 10 hold - 1x daily - 7x weekly  Hip Flexor Stretch with Chair - 2 reps - 30 hold - 1x daily - 7x weekly  Quadricep Stretch with Chair and Counter Support - 2 reps - 30 hold - 1x daily - 7x weekly    [facility] [address], [place], [place] [address] Phone # [phone] Fax [phone]

## 2019-07-05 NOTE — Therapy (Signed)
[facility] [address] [place], [place], [address] Phone: [phone]   Fax:  [phone]  Physical Therapy Evaluation  Patient Details  Name: [name] MRN: [id number] Date of Birth: 18-Apr-1941 Referring Provider (PT): Martinique Mayberry, MD   Encounter Date: 07/05/2019  PT End of Session - 07/05/19 1302    Visit Number  1    Authorization Type  [employer] Medicare    PT Start Time  [id number]    PT Stop Time  [id number]    PT Time Calculation (min)  40 min    Activity Tolerance  No increased pain;Patient tolerated treatment well    Behavior During Therapy  [facility] for tasks assessed/performed       Past Medical History:  Diagnosis Date  . Anemia   . Blood transfusion without reported diagnosis 02/23/2014   3 units for iron deficiency anemia. as a baby - whenhe had pneumonia  . BPH (benign prostatic hyperplasia)   . CTS (carpal tunnel syndrome)    better  . Dyspnea   . GERD (gastroesophageal reflux disease)   . Hyperlipidemia   . Hypertension    "THEY DIAGNOSED ME YEARS AGO BUT LATELY IVE ACTUALLY HAD LOW BLOOD PRESSURES "   . Insomnia   . LBP (low back pain)   . Osteoarthritis   . Pneumonia    as a baby  . Sleep apnea    no cpap    Past Surgical History:  Procedure Laterality Date  . CARDIAC CATHETERIZATION N/A 08/15/2015   Procedure: Left Heart Cath and Coronary Angiography;  Surgeon: Lorretta Harp, MD;  Location: [place] [facility];  Service: Cardiovascular;  Laterality: N/A;  . CARPAL TUNNEL RELEASE Bilateral 1996, 2004   Dr Lenoard Aden thumb surgery.  more than once  . COLONOSCOPY  2005, 2012   diverticulosis.   Marland Kitchen ESOPHAGOGASTRODUODENOSCOPY N/A 02/23/2014   Procedure: ESOPHAGOGASTRODUODENOSCOPY (EGD);  Surgeon: Irene Shipper, MD;  Location: [facility] END[facility];  Service: Endoscopy;  Laterality: N/A;  . EYE SURGERY  2012   both cataracts, Lasik  . LEFT KNEE ARTHROSCOPY Left 07/16/2017    [facility]   . LUMBAR  FUSION     x 3  . REVERSE SHOULDER ARTHROPLASTY Left 07/04/2016   Procedure: LEFT REVERSE SHOULDER ARTHROPLASTY;  Surgeon: Netta Cedars, MD;  Location: [place];  Service: Orthopedics;  Laterality: Left;  . TONSILLECTOMY  1946  . TOTAL KNEE ARTHROPLASTY  2003   Right  . TOTAL KNEE ARTHROPLASTY Left 09/25/2017   Procedure: LEFT TOTAL KNEE ARTHROPLASTY;  Surgeon: Sydnee Cabal, MD;  Location: [facility];  Service: Orthopedics;  Laterality: Left;    There were no vitals filed for this visit.   Subjective Assessment - 07/05/19 1208    Subjective  Pt states that he feels like he is having unsteadiness with his daily activity. He is always looking for something to grab a hold of. Just recently his back started bothering him. He has chronic back pain but it can flare up intermittently.    Pertinent History  Lt and Rt TKA, myasthenia gravis, chronic LBP and fusion    Patient Stated Goals  improve strength and mobility    Currently in Pain?  No/denies         [facility] PT Assessment - 07/05/19 0001      Assessment   Medical Diagnosis  myasthenia gravis    Referring Provider (PT)  Martinique Mayberry, MD    Onset Date/Surgical Date  --  no specific onset   Prior Therapy  several times following surgeries      Precautions   Precautions  None      Restrictions   Weight Bearing Restrictions  No      Balance Screen   Has the patient fallen in the past 6 months  No    Has the patient had a decrease in activity level because of a fear of falling?   No    Is the patient reluctant to leave their home because of a fear of falling?   No      Home Environment   Living Environment  Private residence    Living Arrangements  Spouse/significant other    Type of [place]    Additional Comments  stairs at home but doesn't have to use these      Prior Function   Leisure  road bike 10-15 miles several days       Cognition   Overall Cognitive Status  Within Functional Limits for tasks assessed      ROM  / Strength   AROM / PROM / Strength  Strength      Strength   Overall Strength Comments  strength 5/5 MMT of BLEs      Flexibility   Soft Tissue Assessment /Muscle Length  yes    Hamstrings  WNL    Quadriceps  90 deg in prone BLE      Transfers   Five time sit to stand comments   7 sec, no UE support       Balance   Balance Assessed  Yes      Standardized Balance Assessment   Standardized Balance Assessment  Berg Balance Test;Dynamic Gait Index;Timed Up and Go Test      Berg Balance Test   Sit to Stand  Able to stand without using hands and stabilize independently    Standing Unsupported  Able to stand safely 2 minutes    Sitting with Back Unsupported but Feet Supported on Floor or Stool  Able to sit safely and securely 2 minutes    Stand to Sit  Sits safely with minimal use of hands    Transfers  Able to transfer safely, minor use of hands    Standing Unsupported with Eyes Closed  Able to stand 10 seconds safely    Standing Unsupported with Feet Together  Able to place feet together independently and stand 1 minute safely    From Standing, Reach Forward with Outstretched Arm  Can reach confidently >25 cm (10")    From Standing Position, Pick up Object from Floor  Able to pick up shoe safely and easily    From Standing Position, Turn to Look Behind Over each Shoulder  Looks behind from both sides and weight shifts well    Turn 360 Degrees  Able to turn 360 degrees safely in 4 seconds or less    Standing Unsupported, Alternately Place Feet on Step/Stool  Able to stand independently and safely and complete 8 steps in 20 seconds    Standing Unsupported, One Foot in Front  Able to plae foot ahead of the other independently and hold 30 seconds    Standing on One Leg  Tries to lift leg/unable to hold 3 seconds but remains standing independently    Total Score  52      Dynamic Gait Index   Level Surface  Normal    Change in Gait Speed  Normal    Gait  with Horizontal Head Turns   Normal    Gait with Vertical Head Turns  Normal    Gait and Pivot Turn  Normal    Step Over Obstacle  Normal    Step Around Obstacles  Normal    Steps  Mild Impairment    Total Score  23      Timed Up and Go Test   TUG Comments  10 sec                Objective measurements completed on examination: See above findings.      [place] Adult PT Treatment/Exercise - 07/05/19 0001      Exercises   Exercises  Knee/Hip      Knee/Hip Exercises: Stretches   Quad Stretch  Both;1 rep;20 seconds    Quad Stretch Limitations  standing LE in chair     Other Knee/Hip Stretches  single knee to chest stretch 2x10 sec each in supine              PT Education - 07/05/19 1301    Education Details  eval findings/POC; HEP implemented    Person(s) Educated  Patient;Spouse    Methods  Explanation;Handout    Comprehension  Verbalized understanding;Returned demonstration       PT Short Term Goals - 07/05/19 1310      PT SHORT TERM GOAL #1   Title  Pt will demo understanding of his hip flexibility program which will allow him to continue working on this at home.    Time  1    Period  Days    Status  Achieved                Plan - 07/05/19 1302    Clinical Impression Statement  Pt is a pleasant 78 y.o M referred to OPPT with reports of occasional unsteadiness with activity and intermittent low back pain. Pt currently spends 3 days a week on his road bike for cardiovascular endurance, and he denies any LOB this past year. Pt's LE strength is 5/5 MMT and he performed well on his functional balance and mobility tests. He is in a low falls risk category for both dynamic and static balance. In addition, he was able to complete all of today's testing without noted shortness of breath. Pt did have bilateral hip restrictions in hip flexion and extension, so therapist was able to provide home stretches for this moving forward. Currently, pt has no need for skilled PT and can address hip  flexibility restrictions on his own moving forward. The results of today's evaluation were reviewed with both the pt and his spouse, and they were in agreement with this being a one time visit.    Stability/Clinical Decision Making  Stable/Uncomplicated    PT Frequency  One time visit    PT Treatment/Interventions  ADLs/Self Care Home Management;Neuromuscular re-education;Patient/family education;Therapeutic exercise;Therapeutic activities    PT Home Exercise Plan  [id number]    Consulted and Agree with Plan of Care  Patient       Patient will benefit from skilled therapeutic intervention in order to improve the following deficits and impairments:     Visit Diagnosis: Muscle weakness (generalized)  Unsteadiness on feet     Problem List Patient Active Problem List   Diagnosis Date Noted  . Depressed mood 05/23/2019  . GERD (gastroesophageal reflux disease) 09/09/2018  . Chronic coughing 06/07/2018  . Hypokalemia 04/16/2018  . History of plasmapheresis 04/12/2018  . MCI (mild cognitive impairment) 04/12/2018  .  Myasthenia exacerbation ([place]) 04/12/2018  . Weakness of both lower extremities 01/13/2018  . Myasthenia gravis, AChR antibody positive ([place]) 01/13/2018  . Dysarthria 12/04/2017  . Dysphagia   . CVA (cerebral vascular accident) ([place]) 11/30/2017  . Angio-edema 11/26/2017  . S/P knee replacement 09/25/2017  . Knee pain, chronic 08/14/2017  . OSA on CPAP 03/05/2017  . OSA (obstructive sleep apnea) 09/30/2016  . S/P shoulder replacement, left 07/04/2016  . Preop exam for internal medicine 04/16/2016  . Claudication ([place]) 09/14/2015  . Pain in the chest   . Abnormal stress test 08/14/2015  . Dyspnea 07/23/2015  . Paresthesia 10/25/2014  . Weakness 04/13/2014  . Heme positive stool 02/22/2014  . Benign prostatic hyperplasia 02/22/2014  . B12 deficiency 02/21/2014  . Anemia, iron deficiency 02/21/2014  . Leg weakness, bilateral 01/24/2014  . Erectile dysfunction  01/24/2014  . Edema 01/24/2014  . URI, acute 08/31/2013  . Cellulitis of arm, right 07/13/2013  . Grief 07/13/2013  . Well adult exam 03/01/2012  . CARPAL TUNNEL SYNDROME 11/04/2010  . BENIGN PROSTATIC HYPERTROPHY, WITH URINARY OBSTRUCTION 11/04/2010  . WRIST PAIN 11/04/2010  . LEG PAIN 07/16/2010  . COUGH 05/20/2010  . TOBACCO USE, QUIT 07/04/2009  . WARTS, VIRAL, UNSPECIFIED 11/06/2008  . INSOMNIA, PERSISTENT 01/14/2008  . Essential hypertension 01/14/2008  . RHINITIS 01/14/2008  . ABDOMINAL PAIN 01/14/2008  . TREMOR 09/07/2007  . Actinic keratosis 08/12/2007  . Neoplasm of uncertain behavior of skin 07/06/2007  . LOW BACK PAIN 07/06/2007  . Dyslipidemia 06/05/2007  . Coronary atherosclerosis 06/05/2007  . Osteoarthritis 06/05/2007    1:12 PM,07/05/19 Sherol Dade PT, DPT [place] [facility] [address] [place], [place], [address] Phone: [phone]   Fax:  [phone]  Name: BAUDELIO ZEPHIER MRN: [id number] Date of Birth: 27-Dec-1940

## 2019-08-08 ENCOUNTER — Encounter: Payer: Self-pay | Admitting: Adult Health

## 2019-08-08 DIAGNOSIS — H903 Sensorineural hearing loss, bilateral: Secondary | ICD-10-CM | POA: Diagnosis not present

## 2019-08-09 DIAGNOSIS — G7000 Myasthenia gravis without (acute) exacerbation: Secondary | ICD-10-CM | POA: Diagnosis not present

## 2019-08-11 ENCOUNTER — Encounter: Payer: Self-pay | Admitting: Adult Health

## 2019-08-11 ENCOUNTER — Other Ambulatory Visit: Payer: Self-pay

## 2019-08-11 ENCOUNTER — Ambulatory Visit: Payer: Medicare Other | Admitting: Adult Health

## 2019-08-11 VITALS — BP 114/72 | HR 66 | Temp 97.4°F | Ht 72.0 in | Wt 250.0 lb

## 2019-08-11 DIAGNOSIS — Z9989 Dependence on other enabling machines and devices: Secondary | ICD-10-CM | POA: Diagnosis not present

## 2019-08-11 DIAGNOSIS — G4733 Obstructive sleep apnea (adult) (pediatric): Secondary | ICD-10-CM

## 2019-08-11 NOTE — Patient Instructions (Signed)

## 2019-08-11 NOTE — Progress Notes (Signed)
PATIENT: [name] DOB: 06/18/1941  REASON FOR VISIT: follow up HISTORY FROM: patient  HISTORY OF PRESENT ILLNESS: Today 08/11/19:  [name] is a 78 year old male with a history of obstructive sleep apnea on CPAP.  His download indicates that he use his machine 24 out of 30 days for compliance of 80%.  He uses machine greater than 4 hours 14 days for compliance of 47%.  On average he uses his machine 5 hours and 21 minutes.  His residual AHI is 6 on 7 to 14 cm of water with EPR 3.  His leak in the 95th percentile is 35.3 L/min.  He does need new supplies.  He reports that he does not like the tubing that comes out at the top of the head.  Patient reports that he still does not like using the CPAP but understands the benefit.  HISTORY 02/17/19:  [name] is a 78 year old male with a history of OSA on CPAP.  His CPAP download indicates that he uses machine 28 out of 30 days for compliance of 93%.  He uses machine greater than 4 hours 24 days for compliance of 80%.  On average he uses his machine 5 hours and 25 minutes.  His residual AHI is 5.6 on 7 to 13 cm of water with EPR of 3.  His leak in the 95th percentile is 20.9 L/min.  He reports that he has made a conscious effort to try to use the CPAP more often.  He is following with a [facility] neurologist for myasthenia gravis and memory disturbance.  He joins me today for virtual visit.  REVIEW OF SYSTEMS: Out of a complete 14 system review of symptoms, the patient complains only of the following symptoms, and all other reviewed systems are negative.  See HPI  ALLERGIES: Allergies  Allergen Reactions  . Ferumoxytol Other (See Comments)    Stroke like symptoms; has hx of MG as well     HOME MEDICATIONS: Outpatient Medications Prior to Visit  Medication Sig Dispense Refill  . Cholecalciferol (VITAMIN D3) 2000 units TABS Take 2,000 Units by mouth at bedtime.    . Coenzyme Q10 (CO Q-10 PO) Take 1 tablet by mouth at  bedtime.    . Cyanocobalamin (VITAMIN B-12) 1000 MCG SUBL Place 1 tablet (1,000 mcg total) under the tongue daily. (Patient taking differently: Place 1,000 mcg under the tongue at bedtime. ) 100 tablet 3  . famotidine (PEPCID) 40 MG tablet Take 1 tablet (40 mg total) by mouth daily. 90 tablet 3  . finasteride (PROSCAR) 5 MG tablet TAKE 1 TABLET BY MOUTH  DAILY 90 tablet 3  . furosemide (LASIX) 20 MG tablet TAKE 1 -2 TABLET (20 MG) BY  MOUTH DAILY IN THE EVENING 90 tablet 3  . mycophenolate (CELLCEPT) 250 MG capsule Take 1,000 mg by mouth 2 (two) times daily.    Marland Kitchen PARoxetine (PAXIL) 20 MG tablet Take 1 tablet (20 mg total) by mouth daily. 90 tablet 1  . Polyethyl Glycol-Propyl Glycol (LUBRICANT EYE DROPS) 0.4-0.3 % SOLN Place 1-2 drops into both eyes daily.    . potassium chloride (K-DUR) 10 MEQ tablet TAKE 1 TABLET (10 MEQ TOTAL) BY MOUTH 2 (TWO) TIMES DAILY. 180 tablet 1  . predniSONE (DELTASONE) 10 MG tablet 25 mg qod 20 mg qod (Patient taking differently: 15 mg daily. 25 mg qod 20 mg qod States "15mg  per day") 100 tablet 3  . pyridostigmine (MESTINON) 60 MG tablet One in AM ,  one at lunch, 2 at bedtime. (Patient taking differently: one at lunch) 450 tablet 3  . tamsulosin (FLOMAX) 0.4 MG CAPS capsule Take 2 capsules (0.8 mg total) by mouth daily. 180 capsule 3  . ipratropium (ATROVENT) 0.06 % nasal spray Place 2 sprays into the nose 3 (three) times daily. 15 mL 2   No facility-administered medications prior to visit.    PAST MEDICAL HISTORY: Past Medical History:  Diagnosis Date  . Anemia   . Blood transfusion without reported diagnosis 02/23/2014   3 units for iron deficiency anemia. as a baby - whenhe had pneumonia  . BPH (benign prostatic hyperplasia)   . CTS (carpal tunnel syndrome)    better  . Dyspnea   . GERD (gastroesophageal reflux disease)   . Hyperlipidemia   . Hypertension    "THEY DIAGNOSED ME YEARS AGO BUT LATELY IVE ACTUALLY HAD LOW BLOOD PRESSURES "   . Insomnia    . LBP (low back pain)   . Osteoarthritis   . Pneumonia    as a baby  . Sleep apnea    no cpap    PAST SURGICAL HISTORY: Past Surgical History:  Procedure Laterality Date  . CARDIAC CATHETERIZATION N/A 08/15/2015   Procedure: Left Heart Cath and Coronary Angiography;  Surgeon: Lorretta Harp, MD;  Location: [place] [facility];  Service: Cardiovascular;  Laterality: N/A;  . CARPAL TUNNEL RELEASE Bilateral 1996, 2004   Dr Lenoard Aden thumb surgery.  more than once  . COLONOSCOPY  2005, 2012   diverticulosis.   Marland Kitchen ESOPHAGOGASTRODUODENOSCOPY N/A 02/23/2014   Procedure: ESOPHAGOGASTRODUODENOSCOPY (EGD);  Surgeon: Irene Shipper, MD;  Location: [facility] ENDOSCOPY;  Service: Endoscopy;  Laterality: N/A;  . EYE SURGERY  2012   both cataracts, Lasik  . LEFT KNEE ARTHROSCOPY Left 07/16/2017    [facility]   . LUMBAR FUSION     x 3  . REVERSE SHOULDER ARTHROPLASTY Left 07/04/2016   Procedure: LEFT REVERSE SHOULDER ARTHROPLASTY;  Surgeon: Netta Cedars, MD;  Location: [place];  Service: Orthopedics;  Laterality: Left;  . TONSILLECTOMY  1946  . TOTAL KNEE ARTHROPLASTY  2003   Right  . TOTAL KNEE ARTHROPLASTY Left 09/25/2017   Procedure: LEFT TOTAL KNEE ARTHROPLASTY;  Surgeon: Sydnee Cabal, MD;  [facility];  Service: Orthopedics;  Laterality: Left;    FAMILY HISTORY: Family History  Problem Relation Age of Onset  . Aneurysm Father        AAA  . Heart disease Father 16       CHF  . Alzheimer's disease Father   . Cancer Brother   . Alzheimer's disease Brother        dementia  . Stomach cancer Brother   . Cancer Mother 28       breast cancer  . Heart disease Mother 76       MI  . Autoimmune disease Son   . CAD Brother   . Colon cancer Neg Hx   . Esophageal cancer Neg Hx   . Pancreatic cancer Neg Hx   . Prostate cancer Neg Hx   . Rectal cancer Neg Hx     SOCIAL HISTORY: Social History   Socioeconomic History  . Marital status: Married    Spouse name: Not on file  . Number of  children: Not on file  . Years of education: Not on file  . Highest education level: Not on file  Occupational History  . Occupation: Retired    Fish farm manager: RETIRED  Comment: Mechanic  Tobacco Use  . Smoking status: Former Smoker    Quit date: 01/08/1987    Years since quitting: 32.6  . Smokeless tobacco: Never Used  Substance and Sexual Activity  . Alcohol use: Yes    Alcohol/week: 0.0 standard drinks    Comment: rare  . Drug use: No  . Sexual activity: Not Currently  Other Topics Concern  . Not on file  Social History Narrative   He lives with wife in a 2 story home.  Has 2 children.   Retired Dealer.   Highest level of education:  High school   Regular Exercise -  YES   Social Determinants of Health   Financial Resource Strain:   . Difficulty of Paying Living Expenses: Not on file  Food Insecurity:   . Worried About Charity fundraiser in the Last Year: Not on file  . Ran Out of Food in the Last Year: Not on file  Transportation Needs:   . Lack of Transportation (Medical): Not on file  . Lack of Transportation (Non-Medical): Not on file  Physical Activity:   . Days of Exercise per Week: Not on file  . Minutes of Exercise per Session: Not on file  Stress:   . Feeling of Stress : Not on file  Social Connections:   . Frequency of Communication with Friends and Family: Not on file  . Frequency of Social Gatherings with Friends and Family: Not on file  . Attends Religious Services: Not on file  . Active Member of Clubs or Organizations: Not on file  . Attends Archivist Meetings: Not on file  . Marital Status: Not on file  Intimate Partner Violence:   . Fear of Current or Ex-Partner: Not on file  . Emotionally Abused: Not on file  . Physically Abused: Not on file  . Sexually Abused: Not on file      PHYSICAL EXAM  Vitals:   08/11/19 1424  BP: 114/72  Pulse: 66  Temp: (!) 97.4 F (36.3 C)  Weight: 250 lb (113.4 kg)  Height: 6' (1.829 m)    Body mass index is 33.91 kg/m.  Generalized: Well developed, in no acute distress  Chest: Lungs clear to auscultation bilaterally  Neurological examination  Mentation: Alert oriented to time, place, history taking. Follows all commands speech and language fluent Cranial nerve II-XII: Extraocular movements were full, visual field were full on confrontational test Head turning and shoulder shrug  were normal and symmetric. Motor: The motor testing reveals 5 over 5 strength of all 4 extremities. Good symmetric motor tone is noted throughout.  Sensory: Sensory testing is intact to soft touch on all 4 extremities. No evidence of extinction is noted.  Gait and station: Gait is normal.    DIAGNOSTIC DATA (LABS, IMAGING, TESTING) - I reviewed patient records, labs, notes, testing and imaging myself where available.  Lab Results  Component Value Date   WBC 8.8 05/23/2019   HGB 11.6 (L) 05/23/2019   HCT 36.9 (L) 05/23/2019   MCV 85.2 05/23/2019   PLT 256.0 05/23/2019      Component Value Date/Time   NA 144 05/23/2019 0903   NA 144 02/05/2018 1205   NA 140 05/16/2014 1327   K 4.7 05/23/2019 0903   K 3.8 05/16/2014 1327   CL 104 05/23/2019 0903   CO2 32 05/23/2019 0903   CO2 28 05/16/2014 1327   GLUCOSE 78 05/23/2019 0903   GLUCOSE 98 05/16/2014 1327   BUN  17 05/23/2019 0903   BUN 11 02/05/2018 1205   BUN 17.6 05/16/2014 1327   CREATININE 0.80 05/23/2019 0903   CREATININE 0.82 08/13/2015 0953   CREATININE 0.9 05/16/2014 1327   CALCIUM 10.0 05/23/2019 0903   CALCIUM 9.4 05/16/2014 1327   PROT 6.1 01/13/2019 1117   PROT 6.0 02/05/2018 1205   PROT 6.9 05/16/2014 1327   ALBUMIN 4.0 01/13/2019 1117   ALBUMIN 4.4 02/05/2018 1205   ALBUMIN 3.7 05/16/2014 1327   AST 11 01/13/2019 1117   AST 19 05/16/2014 1327   ALT 19 01/13/2019 1117   ALT 9 05/16/2014 1327   ALKPHOS 41 01/13/2019 1117   ALKPHOS 66 05/16/2014 1327   BILITOT 0.5 01/13/2019 1117   BILITOT 0.4 02/05/2018 1205    BILITOT 0.50 05/16/2014 1327   GFRNONAA 95 02/05/2018 1205   GFRNONAA 87 08/13/2015 0953   GFRAA 109 02/05/2018 1205   GFRAA >89 08/13/2015 0953   Lab Results  Component Value Date   CHOL 182 12/01/2017   HDL 48 12/01/2017   LDLCALC 99 12/01/2017   LDLDIRECT 157.5 07/05/2008   TRIG 174 (H) 12/01/2017   CHOLHDL 3.8 12/01/2017   Lab Results  Component Value Date   HGBA1C 5.5 02/05/2018   Lab Results  Component Value Date   VITAMINB12 [id number] 05/23/2019   Lab Results  Component Value Date   TSH 1.17 05/23/2019      ASSESSMENT AND PLAN 78 y.o. year old male  has a past medical history of Anemia, Blood transfusion without reported diagnosis (02/23/2014), BPH (benign prostatic hyperplasia), CTS (carpal tunnel syndrome), Dyspnea, GERD (gastroesophageal reflux disease), Hyperlipidemia, Hypertension, Insomnia, LBP (low back pain), Osteoarthritis, Pneumonia, and Sleep apnea. here with:  1. Obstructive sleep apnea on CPAP  The patient's CPAP download shows suboptimal compliance.  Patient's residual AHI is slightly elevated but the patient also has high leak.  I will request different tubing from his DME company. he is encouraged to continue using CPAP nightly and greater than 4 hours each night.  He is advised that if his symptoms worsen or he develops new symptoms he should let us know.  He will follow-up in 1 year or sooner if needed    I spent 15 minutes with the patient. 50% of this time was spent reviewing CPAP download   Ward Givens, MSN, NP-C 08/11/2019, 2:34 PM [facility] [address] [place], [place] [address] [phone]

## 2019-08-31 ENCOUNTER — Ambulatory Visit (INDEPENDENT_AMBULATORY_CARE_PROVIDER_SITE_OTHER): Payer: Medicare Other | Admitting: Internal Medicine

## 2019-08-31 ENCOUNTER — Encounter: Payer: Self-pay | Admitting: Internal Medicine

## 2019-08-31 ENCOUNTER — Other Ambulatory Visit: Payer: Self-pay

## 2019-08-31 DIAGNOSIS — M5440 Lumbago with sciatica, unspecified side: Secondary | ICD-10-CM | POA: Diagnosis not present

## 2019-08-31 DIAGNOSIS — I2510 Atherosclerotic heart disease of native coronary artery without angina pectoris: Secondary | ICD-10-CM | POA: Diagnosis not present

## 2019-08-31 DIAGNOSIS — D509 Iron deficiency anemia, unspecified: Secondary | ICD-10-CM

## 2019-08-31 DIAGNOSIS — E538 Deficiency of other specified B group vitamins: Secondary | ICD-10-CM

## 2019-08-31 DIAGNOSIS — I10 Essential (primary) hypertension: Secondary | ICD-10-CM | POA: Diagnosis not present

## 2019-08-31 LAB — CBC WITH DIFFERENTIAL/PLATELET
Basophils Absolute: 0.1 10*3/uL (ref 0.0–0.1)
Basophils Relative: 0.7 % (ref 0.0–3.0)
Eosinophils Absolute: 0.1 10*3/uL (ref 0.0–0.7)
Eosinophils Relative: 0.7 % (ref 0.0–5.0)
HCT: 30.2 % — ABNORMAL LOW (ref 39.0–52.0)
Hemoglobin: 9.3 g/dL — ABNORMAL LOW (ref 13.0–17.0)
Lymphocytes Relative: 18.4 % (ref 12.0–46.0)
Lymphs Abs: 1.5 10*3/uL (ref 0.7–4.0)
MCHC: 30.9 g/dL (ref 30.0–36.0)
MCV: 80.7 fl (ref 78.0–100.0)
Monocytes Absolute: 1.2 10*3/uL — ABNORMAL HIGH (ref 0.1–1.0)
Monocytes Relative: 14.8 % — ABNORMAL HIGH (ref 3.0–12.0)
Neutro Abs: 5.4 10*3/uL (ref 1.4–7.7)
Neutrophils Relative %: 65.4 % (ref 43.0–77.0)
Platelets: 267 10*3/uL (ref 150.0–400.0)
RBC: 3.74 Mil/uL — ABNORMAL LOW (ref 4.22–5.81)
RDW: 17.1 % — ABNORMAL HIGH (ref 11.5–15.5)
WBC: 8.2 10*3/uL (ref 4.0–10.5)

## 2019-08-31 LAB — BASIC METABOLIC PANEL
BUN: 25 mg/dL — ABNORMAL HIGH (ref 6–23)
CO2: 30 mEq/L (ref 19–32)
Calcium: 9.6 mg/dL (ref 8.4–10.5)
Chloride: 106 mEq/L (ref 96–112)
Creatinine, Ser: 0.73 mg/dL (ref 0.40–1.50)
GFR: 103.79 mL/min (ref >60.00)
Glucose, Bld: 76 mg/dL (ref 70–99)
Potassium: 4.8 mEq/L (ref 3.5–5.1)
Sodium: 142 mEq/L (ref 135–145)

## 2019-08-31 LAB — IBC PANEL
Iron: 22 ug/dL — ABNORMAL LOW (ref 42–165)
Saturation Ratios: 5.3 % — ABNORMAL LOW (ref 20.0–50.0)
Transferrin: 296 mg/dL (ref 212.0–360.0)

## 2019-08-31 MED ORDER — DONEPEZIL HCL 10 MG PO TABS: 10.0000 mg | ORAL_TABLET | Freq: Every day | ORAL | 3 refills | Status: DC

## 2019-08-31 NOTE — Assessment & Plan Note (Signed)
Chronic sx's 

## 2019-08-31 NOTE — Assessment & Plan Note (Signed)
No CP 

## 2019-08-31 NOTE — Assessment & Plan Note (Signed)
On B12 

## 2019-08-31 NOTE — Progress Notes (Signed)
Subjective:  Patient ID: [name], male    DOB: 04-30-1941  Age: 79 y.o. MRN: [id number]  CC: No chief complaint on file.   HPI [name] presents for MG, B12, HTN, BPH C/o memory loss- Neurol appt at [facility]   Outpatient Medications Prior to Visit  Medication Sig Dispense Refill  . Cholecalciferol (VITAMIN D3) 2000 units TABS Take 2,000 Units by mouth at bedtime.    . Coenzyme Q10 (CO Q-10 PO) Take 1 tablet by mouth at bedtime.    . Cyanocobalamin (VITAMIN B-12) 1000 MCG SUBL Place 1 tablet (1,000 mcg total) under the tongue daily. (Patient taking differently: Place 1,000 mcg under the tongue at bedtime. ) 100 tablet 3  . famotidine (PEPCID) 40 MG tablet Take 1 tablet (40 mg total) by mouth daily. 90 tablet 3  . finasteride (PROSCAR) 5 MG tablet TAKE 1 TABLET BY MOUTH  DAILY 90 tablet 3  . furosemide (LASIX) 20 MG tablet TAKE 1 -2 TABLET (20 MG) BY  MOUTH DAILY IN THE EVENING 90 tablet 3  . mycophenolate (CELLCEPT) 250 MG capsule Take 1,000 mg by mouth 2 (two) times daily.    Marland Kitchen PARoxetine (PAXIL) 20 MG tablet Take 1 tablet (20 mg total) by mouth daily. 90 tablet 1  . Polyethyl Glycol-Propyl Glycol (LUBRICANT EYE DROPS) 0.4-0.3 % SOLN Place 1-2 drops into both eyes daily.    . potassium chloride (K-DUR) 10 MEQ tablet TAKE 1 TABLET (10 MEQ TOTAL) BY MOUTH 2 (TWO) TIMES DAILY. 180 tablet 1  . predniSONE (DELTASONE) 10 MG tablet 25 mg qod 20 mg qod (Patient taking differently: 15 mg daily. 25 mg qod 20 mg qod States "15mg  per day") 100 tablet 3  . pyridostigmine (MESTINON) 60 MG tablet One in AM , one at lunch, 2 at bedtime. (Patient taking differently: one at lunch) 450 tablet 3  . tamsulosin (FLOMAX) 0.4 MG CAPS capsule Take 2 capsules (0.8 mg total) by mouth daily. 180 capsule 3  . ipratropium (ATROVENT) 0.06 % nasal spray Place 2 sprays into the nose 3 (three) times daily. 15 mL 2   No facility-administered medications prior to visit.    ROS: Review of Systems    Constitutional: Negative for appetite change, fatigue and unexpected weight change.  HENT: Negative for congestion, nosebleeds, sneezing, sore throat and trouble swallowing.   Eyes: Negative for itching and visual disturbance.  Respiratory: Negative for cough.   Cardiovascular: Negative for chest pain, palpitations and leg swelling.  Gastrointestinal: Negative for abdominal distention, blood in stool, diarrhea and nausea.  Genitourinary: Negative for frequency and hematuria.  Musculoskeletal: Negative for back pain, gait problem, joint swelling and neck pain.  Skin: Negative for rash.  Neurological: Negative for dizziness, tremors, speech difficulty and weakness.  Psychiatric/Behavioral: Positive for confusion. Negative for agitation, dysphoric mood, sleep disturbance and suicidal ideas. The patient is not nervous/anxious.     Objective:  BP 122/72 (BP Location: Left Arm, Patient Position: Sitting, Cuff Size: Large)   Pulse 65   Temp 98.2 F (36.8 C) (Oral)   Ht 6' (1.829 m)   Wt 254 lb (115.2 kg)   SpO2 96%   BMI 34.45 kg/m   BP Readings from Last 3 Encounters:  08/31/19 122/72  08/11/19 114/72  05/23/19 124/76    Wt Readings from Last 3 Encounters:  08/31/19 254 lb (115.2 kg)  08/11/19 250 lb (113.4 kg)  05/23/19 249 lb (112.9 kg)    Physical Exam Constitutional:  General: He is not in acute distress.    Appearance: He is well-developed.     Comments: NAD  Eyes:     Conjunctiva/sclera: Conjunctivae normal.     Pupils: Pupils are equal, round, and reactive to light.  Neck:     Thyroid: No thyromegaly.     Vascular: No JVD.  Cardiovascular:     Rate and Rhythm: Normal rate and regular rhythm.     Heart sounds: Normal heart sounds. No murmur. No friction rub. No gallop.   Pulmonary:     Effort: Pulmonary effort is normal. No respiratory distress.     Breath sounds: Normal breath sounds. No wheezing or rales.  Chest:     Chest wall: No tenderness.   Abdominal:     General: Bowel sounds are normal. There is no distension.     Palpations: Abdomen is soft. There is no mass.     Tenderness: There is no abdominal tenderness. There is no guarding or rebound.  Musculoskeletal:        General: No tenderness. Normal range of motion.     Cervical back: Normal range of motion.  Lymphadenopathy:     Cervical: No cervical adenopathy.  Skin:    General: Skin is warm and dry.     Findings: No rash.  Neurological:     Mental Status: He is alert and oriented to person, place, and time.     Cranial Nerves: No cranial nerve deficit.     Motor: No abnormal muscle tone.     Coordination: Coordination normal.     Gait: Gait normal.     Deep Tendon Reflexes: Reflexes are normal and symmetric.  Psychiatric:        Behavior: Behavior normal.        Thought Content: Thought content normal.        Judgment: Judgment normal.   edema B 1+ Was disoriented  Lab Results  Component Value Date   WBC 8.8 05/23/2019   HGB 11.6 (L) 05/23/2019   HCT 36.9 (L) 05/23/2019   PLT 256.0 05/23/2019   GLUCOSE 78 05/23/2019   CHOL 182 12/01/2017   TRIG 174 (H) 12/01/2017   HDL 48 12/01/2017   LDLDIRECT 157.5 07/05/2008   LDLCALC 99 12/01/2017   ALT 19 01/13/2019   AST 11 01/13/2019   NA 144 05/23/2019   K 4.7 05/23/2019   CL 104 05/23/2019   CREATININE 0.80 05/23/2019   BUN 17 05/23/2019   CO2 32 05/23/2019   TSH 1.17 05/23/2019   PSA 0.25 05/23/2019   INR 0.99 11/30/2017   HGBA1C 5.5 02/05/2018    No results found.  Assessment & Plan:    [name], MD

## 2019-08-31 NOTE — Assessment & Plan Note (Signed)
  On diet  

## 2019-08-31 NOTE — Addendum Note (Signed)
Addended by: Raliegh Ip on: 08/31/2019 08:57 AM   Modules accepted: Orders

## 2019-09-07 DIAGNOSIS — G4733 Obstructive sleep apnea (adult) (pediatric): Secondary | ICD-10-CM | POA: Diagnosis not present

## 2019-09-26 DIAGNOSIS — D508 Other iron deficiency anemias: Secondary | ICD-10-CM | POA: Diagnosis not present

## 2019-09-27 MED ORDER — TAMSULOSIN HCL 0.4 MG PO CAPS: 0.8000 mg | ORAL_CAPSULE | Freq: Every day | ORAL | 3 refills | Status: DC

## 2019-09-27 MED ORDER — FINASTERIDE 5 MG PO TABS: 5.0000 mg | ORAL_TABLET | Freq: Every day | ORAL | 3 refills | Status: DC

## 2019-09-27 MED ORDER — FUROSEMIDE 20 MG PO TABS: ORAL_TABLET | ORAL | 3 refills | Status: DC

## 2019-10-07 ENCOUNTER — Other Ambulatory Visit: Payer: Self-pay | Admitting: Internal Medicine

## 2019-10-12 ENCOUNTER — Other Ambulatory Visit: Payer: Self-pay | Admitting: Internal Medicine

## 2019-10-19 DIAGNOSIS — G301 Alzheimer's disease with late onset: Secondary | ICD-10-CM | POA: Diagnosis not present

## 2019-10-28 ENCOUNTER — Other Ambulatory Visit: Payer: Self-pay | Admitting: Internal Medicine

## 2019-11-29 ENCOUNTER — Encounter: Payer: Self-pay | Admitting: Internal Medicine

## 2019-11-29 ENCOUNTER — Other Ambulatory Visit: Payer: Self-pay

## 2019-11-29 ENCOUNTER — Ambulatory Visit (INDEPENDENT_AMBULATORY_CARE_PROVIDER_SITE_OTHER): Payer: Medicare Other | Admitting: Internal Medicine

## 2019-11-29 DIAGNOSIS — G7000 Myasthenia gravis without (acute) exacerbation: Secondary | ICD-10-CM | POA: Diagnosis not present

## 2019-11-29 DIAGNOSIS — G309 Alzheimer's disease, unspecified: Secondary | ICD-10-CM | POA: Insufficient documentation

## 2019-11-29 DIAGNOSIS — G301 Alzheimer's disease with late onset: Secondary | ICD-10-CM

## 2019-11-29 DIAGNOSIS — E538 Deficiency of other specified B group vitamins: Secondary | ICD-10-CM | POA: Diagnosis not present

## 2019-11-29 DIAGNOSIS — E785 Hyperlipidemia, unspecified: Secondary | ICD-10-CM | POA: Diagnosis not present

## 2019-11-29 DIAGNOSIS — F0280 Dementia in other diseases classified elsewhere without behavioral disturbance: Secondary | ICD-10-CM

## 2019-11-29 DIAGNOSIS — R600 Localized edema: Secondary | ICD-10-CM

## 2019-11-29 DIAGNOSIS — R29898 Other symptoms and signs involving the musculoskeletal system: Secondary | ICD-10-CM

## 2019-11-29 LAB — CBC WITH DIFFERENTIAL/PLATELET
Basophils Absolute: 0 10*3/uL (ref 0.0–0.1)
Basophils Relative: 0.4 % (ref 0.0–3.0)
Eosinophils Absolute: 0.1 10*3/uL (ref 0.0–0.7)
Eosinophils Relative: 1 % (ref 0.0–5.0)
HCT: 29.6 % — ABNORMAL LOW (ref 39.0–52.0)
Hemoglobin: 9.4 g/dL — ABNORMAL LOW (ref 13.0–17.0)
Lymphocytes Relative: 20 % (ref 12.0–46.0)
Lymphs Abs: 1.6 10*3/uL (ref 0.7–4.0)
MCHC: 31.8 g/dL (ref 30.0–36.0)
MCV: 83 fl (ref 78.0–100.0)
Monocytes Absolute: 1.2 10*3/uL — ABNORMAL HIGH (ref 0.1–1.0)
Monocytes Relative: 14.4 % — ABNORMAL HIGH (ref 3.0–12.0)
Neutro Abs: 5.2 10*3/uL (ref 1.4–7.7)
Neutrophils Relative %: 64.2 % (ref 43.0–77.0)
Platelets: 264 10*3/uL (ref 150.0–400.0)
RBC: 3.56 Mil/uL — ABNORMAL LOW (ref 4.22–5.81)
RDW: 20.4 % — ABNORMAL HIGH (ref 11.5–15.5)
WBC: 8.1 10*3/uL (ref 4.0–10.5)

## 2019-11-29 LAB — BASIC METABOLIC PANEL
BUN: 22 mg/dL (ref 6–23)
CO2: 31 mEq/L (ref 19–32)
Calcium: 9.3 mg/dL (ref 8.4–10.5)
Chloride: 105 mEq/L (ref 96–112)
Creatinine, Ser: 0.77 mg/dL (ref 0.40–1.50)
GFR: 97.53 mL/min (ref >60.00)
Glucose, Bld: 80 mg/dL (ref 70–99)
Potassium: 3.9 mEq/L (ref 3.5–5.1)
Sodium: 142 mEq/L (ref 135–145)

## 2019-11-29 LAB — IRON,TIBC AND FERRITIN PANEL
%SAT: 5 % (calc) — ABNORMAL LOW (ref 20–48)
Ferritin: 9 ng/mL — ABNORMAL LOW (ref 24–380)
Iron: 17 ug/dL — ABNORMAL LOW (ref 50–180)
TIBC: 341 mcg/dL (calc) (ref 250–425)

## 2019-11-29 MED ORDER — SERTRALINE HCL 100 MG PO TABS: 100.0000 mg | ORAL_TABLET | Freq: Every day | ORAL | 3 refills | Status: DC

## 2019-11-29 NOTE — Progress Notes (Signed)
Subjective:  Patient ID: [name], male    DOB: [birth year]  Age: 79 y.o. MRN: [id number]  CC: No chief complaint on file.   HPI [name] presents for memory loss, depression. [facility] asked to replace Paxil for Zoloft. Lost 6 lbs. F/u HTN, CAD  Outpatient Medications Prior to Visit  Medication Sig Dispense Refill  . Cholecalciferol (VITAMIN D3) 2000 units TABS Take 2,000 Units by mouth at bedtime.    . Coenzyme Q10 (CO Q-10 PO) Take 1 tablet by mouth at bedtime.    . Cyanocobalamin (VITAMIN B-12) 1000 MCG SUBL Place 1 tablet (1,000 mcg total) under the tongue daily. (Patient taking differently: Place 1,000 mcg under the tongue at bedtime. ) 100 tablet 3  . donepezil (ARICEPT) 10 MG tablet Take 1 tablet (10 mg total) by mouth at bedtime. 90 tablet 3  . famotidine (PEPCID) 40 MG tablet TAKE 1 TABLET BY MOUTH EVERY DAY 90 tablet 2  . finasteride (PROSCAR) 5 MG tablet Take 1 tablet (5 mg total) by mouth daily. 90 tablet 3  . furosemide (LASIX) 20 MG tablet TAKE 1 -2 TABLET (20 MG) BY  MOUTH DAILY IN THE EVENING 90 tablet 3  . mycophenolate (CELLCEPT) 250 MG capsule Take 1,000 mg by mouth 2 (two) times daily.    Marland Kitchen PARoxetine (PAXIL) 20 MG tablet TAKE 1 TABLET BY MOUTH EVERY DAY 90 tablet 1  . Polyethyl Glycol-Propyl Glycol (LUBRICANT EYE DROPS) 0.4-0.3 % SOLN Place 1-2 drops into both eyes daily.    . potassium chloride (KLOR-CON) 10 MEQ tablet TAKE 1 TABLET BY MOUTH DAILY 180 tablet 1  . predniSONE (DELTASONE) 10 MG tablet 25 mg qod 20 mg qod (Patient taking differently: 15 mg daily. 25 mg qod 20 mg qod States "15mg  per day") 100 tablet 3  . pyridostigmine (MESTINON) 60 MG tablet One in AM , one at lunch, 2 at bedtime. (Patient taking differently: one at lunch) 450 tablet 3  . tamsulosin (FLOMAX) 0.4 MG CAPS capsule Take 2 capsules (0.8 mg total) by mouth daily. 180 capsule 3  . ipratropium (ATROVENT) 0.06 % nasal spray Place 2 sprays into the nose 3 (three) times  daily. 15 mL 2  . mycophenolate (CELLCEPT) 500 MG tablet Take 1,000 mg by mouth 2 (two) times daily.     No facility-administered medications prior to visit.    ROS: Review of Systems  Constitutional: Positive for fatigue. Negative for appetite change and unexpected weight change.  HENT: Negative for congestion, nosebleeds, sneezing, sore throat and trouble swallowing.   Eyes: Negative for itching and visual disturbance.  Respiratory: Negative for cough.   Cardiovascular: Positive for leg swelling. Negative for chest pain and palpitations.  Gastrointestinal: Negative for abdominal distention, blood in stool, diarrhea and nausea.  Genitourinary: Negative for frequency and hematuria.  Musculoskeletal: Positive for arthralgias and gait problem. Negative for back pain, joint swelling and neck pain.  Skin: Negative for rash.  Neurological: Negative for dizziness, tremors, speech difficulty and weakness.  Psychiatric/Behavioral: Positive for decreased concentration. Negative for agitation, dysphoric mood, sleep disturbance and suicidal ideas. The patient is not nervous/anxious.     Objective:  BP 126/70 (BP Location: Left Arm, Patient Position: Sitting, Cuff Size: Large)   Pulse 60   Temp 98.6 F (37 C) (Oral)   Ht 6' (1.829 m)   Wt 248 lb 4 oz (112.6 kg)   SpO2 95%   BMI 33.67 kg/m   BP Readings from Last 3 Encounters:  11/29/19 126/70  08/31/19 122/72  08/11/19 114/72    Wt Readings from Last 3 Encounters:  11/29/19 248 lb 4 oz (112.6 kg)  08/31/19 254 lb (115.2 kg)  08/11/19 250 lb (113.4 kg)    Physical Exam Constitutional:      General: He is not in acute distress.    Appearance: He is well-developed. He is obese.     Comments: NAD  Eyes:     Conjunctiva/sclera: Conjunctivae normal.     Pupils: Pupils are equal, round, and reactive to light.  Neck:     Thyroid: No thyromegaly.     Vascular: No JVD.  Cardiovascular:     Rate and Rhythm: Normal rate and regular  rhythm.     Heart sounds: Normal heart sounds. No murmur. No friction rub. No gallop.   Pulmonary:     Effort: Pulmonary effort is normal. No respiratory distress.     Breath sounds: Normal breath sounds. No wheezing or rales.  Chest:     Chest wall: No tenderness.  Abdominal:     General: Bowel sounds are normal. There is no distension.     Palpations: Abdomen is soft. There is no mass.     Tenderness: There is no abdominal tenderness. There is no guarding or rebound.  Musculoskeletal:        General: No tenderness. Normal range of motion.     Cervical back: Normal range of motion.     Right lower leg: Edema present.     Left lower leg: Edema present.  Lymphadenopathy:     Cervical: No cervical adenopathy.  Skin:    General: Skin is warm and dry.     Findings: No rash.  Neurological:     Mental Status: He is alert and oriented to person, place, and time.     Cranial Nerves: No cranial nerve deficit.     Motor: No abnormal muscle tone.     Coordination: Coordination abnormal.     Gait: Gait abnormal.     Deep Tendon Reflexes: Reflexes are normal and symmetric.  Psychiatric:        Behavior: Behavior normal.        Thought Content: Thought content normal.        Judgment: Judgment normal.   1+ B LE edema L>R, NT  Lab Results  Component Value Date   WBC 8.2 08/31/2019   HGB 9.3 (L) 08/31/2019   HCT 30.2 (L) 08/31/2019   PLT 267.0 08/31/2019   GLUCOSE 76 08/31/2019   CHOL 182 12/01/2017   TRIG 174 (H) 12/01/2017   HDL 48 12/01/2017   LDLDIRECT 157.5 07/05/2008   LDLCALC 99 12/01/2017   ALT 19 01/13/2019   AST 11 01/13/2019   NA 142 08/31/2019   K 4.8 08/31/2019   CL 106 08/31/2019   CREATININE 0.73 08/31/2019   BUN 25 (H) 08/31/2019   CO2 30 08/31/2019   TSH 1.17 05/23/2019   PSA 0.25 05/23/2019   INR 0.99 11/30/2017   HGBA1C 5.5 02/05/2018    No results found.  Assessment & Plan:     [name], MD

## 2019-11-29 NOTE — Assessment & Plan Note (Signed)
Pravachol

## 2019-11-29 NOTE — Assessment & Plan Note (Signed)
Prednisone 15 mg/d

## 2019-11-29 NOTE — Addendum Note (Signed)
Addended by: Trenda Moots on: [id number] 09:03 AM   Modules accepted: Orders

## 2019-11-29 NOTE — Assessment & Plan Note (Signed)
On B12 

## 2019-11-29 NOTE — Assessment & Plan Note (Signed)
Elevate legs Compression socks Furosemide prn

## 2019-11-29 NOTE — Addendum Note (Signed)
Addended by: Cassandria Anger on: 11/29/2019 08:59 AM   Modules accepted: Orders

## 2019-11-29 NOTE — Assessment & Plan Note (Signed)
[facility] - Dr Oleta Mouse - asked to replace Paxil for Zoloft. Aricept

## 2019-12-23 DIAGNOSIS — G4733 Obstructive sleep apnea (adult) (pediatric): Secondary | ICD-10-CM | POA: Diagnosis not present

## 2020-01-03 DIAGNOSIS — D508 Other iron deficiency anemias: Secondary | ICD-10-CM | POA: Diagnosis not present

## 2020-01-17 ENCOUNTER — Ambulatory Visit (INDEPENDENT_AMBULATORY_CARE_PROVIDER_SITE_OTHER): Payer: Medicare Other

## 2020-01-17 DIAGNOSIS — Z0000 Encounter for general adult medical examination without abnormal findings: Secondary | ICD-10-CM | POA: Diagnosis not present

## 2020-01-17 NOTE — Progress Notes (Addendum)
I connected with [name] today by telephone and verified that I am speaking with the correct person using two identifiers. Location patient: home Location provider: work Persons participating in the virtual visit: Perrin Mcpartland, wife and Lisette Abu, LPN   I discussed the limitations, risks, security and privacy concerns of performing an evaluation and management service by telephone and the availability of in person appointments. I also discussed with the patient that there may be a patient responsible charge related to this service. The patient expressed understanding and verbally consented to this telephonic visit.    Interactive audio and video telecommunications were attempted between this provider and patient, however failed, due to patient having technical difficulties OR patient did not have access to video capability.  We continued and completed visit with audio only.  Some vital signs may be absent or patient reported.   Time Spent with patient on telephone encounter: 20 minutes   Subjective:   [name] is a 79 y.o. male who presents for Medicare Annual/Subsequent preventive examination.  Review of Systems:  No ROS> Medicare Wellness Visit Cardiac Risk Factors include: advanced age (>69men, >42 women);dyslipidemia;family history of premature cardiovascular disease;hypertension;male gender;obesity (BMI >30kg/m2)     Objective:    Vitals: There were no vitals taken for this visit.  There is no height or weight on file to calculate BMI.  Advanced Directives 01/17/2020 07/05/2019 03/04/2018 09/25/2017 09/18/2017 07/01/2016 03/02/2014  Does Patient Have a Medical Advance Directive? Yes Yes Yes No No No Patient does not have advance directive  Does patient want to make changes to medical advance directive? No - Patient declined No - Patient declined - - - - -  Would patient like information on creating a medical advance directive? - - - No - Patient declined  No - Patient declined No - patient declined information -  Pre-existing out of facility DNR order (yellow form or pink MOST form) - - - - - - -    Tobacco Social History   Tobacco Use  Smoking Status Former Smoker   Quit date: 01/08/1987   Years since quitting: 33.0  Smokeless Tobacco Never Used     Counseling given: Not Answered   Clinical Intake:  Pre-visit preparation completed: Yes  Pain : No/denies pain     Nutritional Risks: None Diabetes: No  How often do you need to have someone help you when you read instructions, pamphlets, or other written materials from your doctor or pharmacy?: 1 - Never What is the last grade level you completed in school?: 12th grade  Interpreter Needed?: No  Information entered by :: Faatima Tench N. Cinnamon Morency, LPN  Past Medical History:  Diagnosis Date   Anemia    Blood transfusion without reported diagnosis 02/23/2014   3 units for iron deficiency anemia. as a baby - whenhe had pneumonia   BPH (benign prostatic hyperplasia)    CTS (carpal tunnel syndrome)    better   Dyspnea    GERD (gastroesophageal reflux disease)    Hyperlipidemia    Hypertension    "THEY DIAGNOSED ME YEARS AGO BUT LATELY IVE ACTUALLY HAD LOW BLOOD PRESSURES "    Insomnia    LBP (low back pain)    Osteoarthritis    Pneumonia    as a baby   Sleep apnea    no cpap   Past Surgical History:  Procedure Laterality Date   CARDIAC CATHETERIZATION N/A 08/15/2015   Procedure: Left Heart Cath and Coronary Angiography;  Surgeon:  Lorretta Harp, MD;  Location: [place] [facility];  Service: Cardiovascular;  Laterality: N/A;   CARPAL TUNNEL RELEASE Bilateral 1996, 2004   Dr Lenoard Aden thumb surgery.  more than once   COLONOSCOPY  2005, 2012   diverticulosis.    ESOPHAGOGASTRODUODENOSCOPY N/A 02/23/2014   Procedure: ESOPHAGOGASTRODUODENOSCOPY (EGD);  Surgeon: Irene Shipper, MD;  Location: [facility];  Service: Endoscopy;  Laterality: N/A;   EYE SURGERY  2012   both  cataracts, Lasik   LEFT KNEE ARTHROSCOPY Left 07/16/2017    [facility]    LUMBAR FUSION     x 3   REVERSE SHOULDER ARTHROPLASTY Left 07/04/2016   Procedure: LEFT REVERSE SHOULDER ARTHROPLASTY;  Surgeon: Netta Cedars, MD;  Location: [place];  Service: Orthopedics;  Laterality: Left;   TONSILLECTOMY  1946   TOTAL KNEE ARTHROPLASTY  2003   Right   TOTAL KNEE ARTHROPLASTY Left 09/25/2017   Procedure: LEFT TOTAL KNEE ARTHROPLASTY;  Surgeon: Sydnee Cabal, MD;  Location: [facility];  Service: Orthopedics;  Laterality: Left;   Family History  Problem Relation Age of Onset   Aneurysm Father        AAA   Heart disease Father 33       CHF   Alzheimer's disease Father    Cancer Brother    Alzheimer's disease Brother        dementia   Stomach cancer Brother    Cancer Mother 31       breast cancer   Heart disease Mother 40       MI   Autoimmune disease Son    CAD Brother    Colon cancer Neg Hx    Esophageal cancer Neg Hx    Pancreatic cancer Neg Hx    Prostate cancer Neg Hx    Rectal cancer Neg Hx    Social History   Socioeconomic History   Marital status: Married    Spouse name: Not on file   Number of children: Not on file   Years of education: Not on file   Highest education level: Not on file  Occupational History   Occupation: Retired    Fish farm manager: RETIRED    Comment: Dealer  Tobacco Use   Smoking status: Former Smoker    Quit date: 01/08/1987    Years since quitting: 33.0   Smokeless tobacco: Never Used  Substance and Sexual Activity   Alcohol use: Yes    Alcohol/week: 0.0 standard drinks    Comment: rare   Drug use: No   Sexual activity: Not Currently  Other Topics Concern   Not on file  Social History Narrative   He lives with wife in a 2 story home.  Has 2 children.   Retired Dealer.   Highest level of education:  High school   Regular Exercise -  YES   Social Determinants of Health   Financial Resource Strain:    Difficulty of Paying Living Expenses:     Food Insecurity:    Worried About Charity fundraiser in the Last Year:    Arboriculturist in the Last Year:   Transportation Needs:    Film/video editor (Medical):    Lack of Transportation (Non-Medical):   Physical Activity:    Days of Exercise per Week:    Minutes of Exercise per Session:   Stress:    Feeling of Stress :   Social Connections:    Frequency of Communication with Friends and Family:  Frequency of Social Gatherings with Friends and Family:    Attends Religious Services:    Active Member of Clubs or Organizations:    Attends Archivist Meetings:    Marital Status:     Outpatient Encounter Medications as of 01/17/2020  Medication Sig   Cholecalciferol (VITAMIN D3) 2000 units TABS Take 2,000 Units by mouth at bedtime.   Coenzyme Q10 (CO Q-10 PO) Take 1 tablet by mouth at bedtime.   Cyanocobalamin (VITAMIN B-12) 1000 MCG SUBL Place 1 tablet (1,000 mcg total) under the tongue daily. (Patient taking differently: Place 1,000 mcg under the tongue at bedtime. )   donepezil (ARICEPT) 10 MG tablet Take 1 tablet (10 mg total) by mouth at bedtime.   famotidine (PEPCID) 40 MG tablet TAKE 1 TABLET BY MOUTH EVERY DAY   finasteride (PROSCAR) 5 MG tablet Take 1 tablet (5 mg total) by mouth daily.   furosemide (LASIX) 20 MG tablet TAKE 1 -2 TABLET (20 MG) BY  MOUTH DAILY IN THE EVENING   mycophenolate (CELLCEPT) 500 MG tablet Take 1,000 mg by mouth 2 (two) times daily.   Polyethyl Glycol-Propyl Glycol (LUBRICANT EYE DROPS) 0.4-0.3 % SOLN Place 1-2 drops into both eyes daily.   potassium chloride (KLOR-CON) 10 MEQ tablet TAKE 1 TABLET BY MOUTH DAILY   predniSONE (DELTASONE) 10 MG tablet 25 mg qod 20 mg qod (Patient taking differently: Take 15 mg by mouth daily. 15 mg po once a day (rx from [facility]))   pyridostigmine (MESTINON) 60 MG tablet One in AM , one at lunch, 2 at bedtime. (Patient taking differently: one at lunch)   sertraline (ZOLOFT) 100 MG tablet Take  1 tablet (100 mg total) by mouth daily.   tamsulosin (FLOMAX) 0.4 MG CAPS capsule Take 2 capsules (0.8 mg total) by mouth daily.   ipratropium (ATROVENT) 0.06 % nasal spray Place 2 sprays into the nose 3 (three) times daily.   mycophenolate (CELLCEPT) 250 MG capsule Take 1,000 mg by mouth 2 (two) times daily.   No facility-administered encounter medications on file as of 01/17/2020.    Activities of Daily Living In your present state of health, do you have any difficulty performing the following activities: 01/17/2020  Hearing? Y  Comment wears hearing aids  Vision? N  Difficulty concentrating or making decisions? Y  Comment at times  Walking or climbing stairs? N  Comment lives on main level of house  Dressing or bathing? N  Doing errands, shopping? N  Comment still able to drive  Preparing Food and eating ? N  Using the Toilet? N  In the past six months, have you accidently leaked urine? N  Do you have problems with loss of bowel control? N  Managing your Medications? N  Managing your Finances? N  Housekeeping or managing your Housekeeping? N  Some recent data might be hidden    Patient Care Team: [name], [name], MD as PCP - General Calvert Cantor, MD (Ophthalmology) Stanford Breed Denice Bors, MD (Cardiology) Irene Shipper, MD as Consulting Physician (Gastroenterology) Netta Cedars, MD as Consulting Physician (Orthopedic Surgery) Sydnee Cabal, MD as Consulting Physician (Orthopedic Surgery) Drema Halon, MD as Referring Physician (Hematology) Estill Batten, MD as Referring Physician (Neurology) Werner Lean Martinique Lee, MD as Referring Physician (Neurology)   Assessment:   This is a routine wellness examination for Rohith.  Exercise Activities and Dietary recommendations Current Exercise Habits: The patient does not participate in regular exercise at present, Exercise limited by: neurologic condition(s);psychological condition(s)  Goals      Client understands the  importance of follow-up with providers by attending scheduled visits (pt-stated)     To live another year.        Fall Risk Fall Risk  01/17/2020 11/29/2019 04/12/2018 01/13/2018 04/15/2017  Falls in the past year? 0 0 No No No  Number falls in past yr: 0 0 - - -  Injury with Fall? 0 0 - - -  Risk Factor Category  - - - - -  Comment - - - - -  Risk for fall due to : Impaired balance/gait - - - -  Risk for fall due to: Comment - - - - -  Follow up Falls evaluation completed - - - -   Is the patient's home free of loose throw rugs in walkways, pet beds, electrical cords, etc?   yes      Grab bars in the bathroom? yes      Handrails on the stairs?   yes      Adequate lighting?   yes  Timed Get Up and Go Performed: not indicated  Depression Screen PHQ 2/9 Scores 01/17/2020 06/03/2018 04/15/2017 06/06/2015  PHQ - 2 Score 0 0 0 0    Cognitive Function MMSE - Mini Mental State Exam 04/12/2018 02/05/2018  Orientation to time 3 4  Orientation to Place 5 4  Registration 3 3  Attention/ Calculation 5 5  Recall 0 1  Language- name 2 objects 2 2  Language- repeat 1 0  Language- follow 3 step command 3 3  Language- read & follow direction 0 1  Write a sentence 1 1  Copy design 1 1  Total score 24 25        Immunization History  Administered Date(s) Administered   H1N1 08/02/2008   Influenza Whole 06/10/2006, 06/15/2008, 05/22/2009, 05/25/2010, 05/26/2011, 04/25/2012   Influenza, High Dose Seasonal PF 04/15/2017, 05/09/2019   Influenza,inj,Quad PF,6+ Mos 05/23/2013, 04/13/2014, 05/23/2015, 04/16/2016   Pneumococcal Conjugate-13 07/10/2014   Pneumococcal Polysaccharide-23 06/25/2006, 12/30/2016   Td 07/04/2009   Tdap 06/04/2013   Zoster 12/24/2006   Zoster Recombinat (Shingrix) 12/28/2016, 05/09/2017    Qualifies for Shingles Vaccine? completed  Screening Tests Health Maintenance  Topic Date Due   COVID-19 Vaccine (1) Never done   INFLUENZA VACCINE  03/25/2020    TETANUS/TDAP  06/05/2023   PNA vac Low Risk Adult  Completed   Cancer Screenings: Lung: Low Dose CT Chest recommended if Age 39-80 years, 30 pack-year currently smoking OR have quit w/in 15years. Patient does qualify. Colorectal: Yes      Plan:     Reviewed health maintenance screenings with patient today and relevant education, vaccines, and/or referrals were provided.    Continue doing brain stimulating activities (puzzles, reading, adult coloring books, staying active) to keep memory sharp.    Continue to eat heart healthy diet (full of fruits, vegetables, whole grains, lean protein, water--limit salt, fat, and sugar intake) and increase physical activity as tolerated.  I have personally reviewed and noted the following in the patient's chart:   Medical and social history Use of alcohol, tobacco or illicit drugs  Current medications and supplements Functional ability and status Nutritional status Physical activity Advanced directives List of other physicians Hospitalizations, surgeries, and ER visits in previous 12 months Vitals Screenings to include cognitive, depression, and falls Referrals and appointments  In addition, I have reviewed and discussed with patient certain preventive protocols, quality metrics, and best practice recommendations. A written personalized  care plan for preventive services as well as general preventive health recommendations were provided to patient.     Sheral Flow, LPN  [id number] Nurse Health Advisor  Nurse Notes: There were no vitals filed for this visit. There is no height or weight on file to calculate BMI.  Medical screening examination/treatment/procedure(s) were performed by non-physician practitioner and as supervising physician I was immediately available for consultation/collaboration.  I agree with above. Lew Dawes, MD

## 2020-02-14 DIAGNOSIS — G7000 Myasthenia gravis without (acute) exacerbation: Secondary | ICD-10-CM | POA: Diagnosis not present

## 2020-02-14 DIAGNOSIS — Z5181 Encounter for therapeutic drug level monitoring: Secondary | ICD-10-CM | POA: Diagnosis not present

## 2020-02-15 ENCOUNTER — Other Ambulatory Visit: Payer: Self-pay

## 2020-02-15 MED ORDER — POTASSIUM CHLORIDE ER 10 MEQ PO TBCR: 10.0000 meq | EXTENDED_RELEASE_TABLET | Freq: Two times a day (BID) | ORAL | 1 refills | Status: DC

## 2020-03-23 DIAGNOSIS — G4733 Obstructive sleep apnea (adult) (pediatric): Secondary | ICD-10-CM | POA: Diagnosis not present

## 2020-03-26 ENCOUNTER — Telehealth: Payer: Self-pay

## 2020-03-26 ENCOUNTER — Other Ambulatory Visit: Payer: Self-pay

## 2020-03-26 ENCOUNTER — Other Ambulatory Visit (INDEPENDENT_AMBULATORY_CARE_PROVIDER_SITE_OTHER): Payer: Medicare Other

## 2020-03-26 DIAGNOSIS — D509 Iron deficiency anemia, unspecified: Secondary | ICD-10-CM

## 2020-03-26 DIAGNOSIS — I10 Essential (primary) hypertension: Secondary | ICD-10-CM | POA: Diagnosis not present

## 2020-03-26 LAB — BASIC METABOLIC PANEL
BUN: 23 mg/dL (ref 6–23)
CO2: 29 mEq/L (ref 19–32)
Calcium: 9.2 mg/dL (ref 8.4–10.5)
Chloride: 106 mEq/L (ref 96–112)
Creatinine, Ser: 0.82 mg/dL (ref 0.40–1.50)
GFR: 90.62 mL/min (ref >60.00)
Glucose, Bld: 101 mg/dL — ABNORMAL HIGH (ref 70–99)
Potassium: 4.3 mEq/L (ref 3.5–5.1)
Sodium: 139 mEq/L (ref 135–145)

## 2020-03-26 LAB — CBC WITH DIFFERENTIAL/PLATELET
Basophils Absolute: 0 10*3/uL (ref 0.0–0.1)
Basophils Relative: 0.4 % (ref 0.0–3.0)
Eosinophils Absolute: 0 10*3/uL (ref 0.0–0.7)
Eosinophils Relative: 0.1 % (ref 0.0–5.0)
HCT: 23.9 % — CL (ref 39.0–52.0)
Hemoglobin: 7.3 g/dL — CL (ref 13.0–17.0)
Lymphocytes Relative: 5.4 % — ABNORMAL LOW (ref 12.0–46.0)
Lymphs Abs: 0.5 10*3/uL — ABNORMAL LOW (ref 0.7–4.0)
MCHC: 30.4 g/dL (ref 30.0–36.0)
MCV: 75.7 fl — ABNORMAL LOW (ref 78.0–100.0)
Monocytes Absolute: 0.6 10*3/uL (ref 0.1–1.0)
Monocytes Relative: 6.7 % (ref 3.0–12.0)
Neutro Abs: 8.4 10*3/uL — ABNORMAL HIGH (ref 1.4–7.7)
Neutrophils Relative %: 87.4 % — ABNORMAL HIGH (ref 43.0–77.0)
Platelets: 259 10*3/uL (ref 150.0–400.0)
RBC: 3.16 Mil/uL — ABNORMAL LOW (ref 4.22–5.81)
RDW: 19.9 % — ABNORMAL HIGH (ref 11.5–15.5)
WBC: 9.6 10*3/uL (ref 4.0–10.5)

## 2020-03-26 LAB — IBC PANEL
Iron: 11 ug/dL — ABNORMAL LOW (ref 42–165)
Saturation Ratios: 2.5 % — ABNORMAL LOW (ref 20.0–50.0)
Transferrin: 317 mg/dL (ref 212.0–360.0)

## 2020-03-26 NOTE — Telephone Encounter (Signed)
Ok to forward to dr [employer]

## 2020-03-26 NOTE — Telephone Encounter (Signed)
Spoke with pts wife and informed her of the critical levels that has been reported of pts Hemoglobin at 7.2 and Hematocrit at 23.9.  *Dr. Alain Marion has been informed and has advised me to call pts wife so they can set up iron infusions to help with these levels.  **Pts wife states she will go ahead and schedule an Iron infusion appointment at [facility].

## 2020-03-27 ENCOUNTER — Telehealth: Payer: Self-pay

## 2020-03-27 NOTE — Telephone Encounter (Signed)
Dr Quita Skye with [facility] calling and is requesting to speak with Dr Alain Marion in regards to mutual patient. Please reach back out when available. CB#: [phone]

## 2020-03-29 DIAGNOSIS — G7000 Myasthenia gravis without (acute) exacerbation: Secondary | ICD-10-CM | POA: Diagnosis not present

## 2020-03-29 DIAGNOSIS — Z87891 Personal history of nicotine dependence: Secondary | ICD-10-CM | POA: Diagnosis not present

## 2020-03-29 DIAGNOSIS — Z7982 Long term (current) use of aspirin: Secondary | ICD-10-CM | POA: Diagnosis not present

## 2020-03-29 DIAGNOSIS — I10 Essential (primary) hypertension: Secondary | ICD-10-CM | POA: Diagnosis not present

## 2020-03-29 DIAGNOSIS — Z9989 Dependence on other enabling machines and devices: Secondary | ICD-10-CM | POA: Diagnosis not present

## 2020-03-29 DIAGNOSIS — Z20822 Contact with and (suspected) exposure to covid-19: Secondary | ICD-10-CM | POA: Diagnosis not present

## 2020-03-29 DIAGNOSIS — R778 Other specified abnormalities of plasma proteins: Secondary | ICD-10-CM | POA: Diagnosis not present

## 2020-03-29 DIAGNOSIS — E876 Hypokalemia: Secondary | ICD-10-CM | POA: Diagnosis not present

## 2020-03-29 DIAGNOSIS — Z79899 Other long term (current) drug therapy: Secondary | ICD-10-CM | POA: Diagnosis not present

## 2020-03-29 DIAGNOSIS — K31819 Angiodysplasia of stomach and duodenum without bleeding: Secondary | ICD-10-CM | POA: Diagnosis not present

## 2020-03-29 DIAGNOSIS — G3184 Mild cognitive impairment, so stated: Secondary | ICD-10-CM | POA: Diagnosis not present

## 2020-03-29 DIAGNOSIS — G4733 Obstructive sleep apnea (adult) (pediatric): Secondary | ICD-10-CM | POA: Diagnosis not present

## 2020-03-29 DIAGNOSIS — D6489 Other specified anemias: Secondary | ICD-10-CM | POA: Diagnosis not present

## 2020-03-29 DIAGNOSIS — R609 Edema, unspecified: Secondary | ICD-10-CM | POA: Diagnosis not present

## 2020-03-29 DIAGNOSIS — D500 Iron deficiency anemia secondary to blood loss (chronic): Secondary | ICD-10-CM | POA: Diagnosis not present

## 2020-03-29 DIAGNOSIS — K219 Gastro-esophageal reflux disease without esophagitis: Secondary | ICD-10-CM | POA: Diagnosis not present

## 2020-03-29 DIAGNOSIS — R0600 Dyspnea, unspecified: Secondary | ICD-10-CM | POA: Diagnosis not present

## 2020-03-29 NOTE — Telephone Encounter (Signed)
New message:   Pt's wife is calling and states that Dr. Lasandra Beech) is needing the pt's most recent lab results. Pt's wife was unable to provide a fax number but if Dr. Camila Li has not already called the provider back can he please call him at [phone]. Pt is scheduled for 04/27/20 for an infusion and pt's wife states he needs to have this infusion sooner than this appt. Pt just got a message from  [facility] while on the phone that stated they needed to go to the [facility] ER for a blood transfusion. Please advise the pt's wife still states they need the labs sent to  [facility] asap.

## 2020-03-30 DIAGNOSIS — R0600 Dyspnea, unspecified: Secondary | ICD-10-CM | POA: Diagnosis not present

## 2020-03-30 DIAGNOSIS — D500 Iron deficiency anemia secondary to blood loss (chronic): Secondary | ICD-10-CM | POA: Diagnosis not present

## 2020-03-30 DIAGNOSIS — D508 Other iron deficiency anemias: Secondary | ICD-10-CM | POA: Diagnosis not present

## 2020-03-30 NOTE — Telephone Encounter (Signed)
I called Dr. Quita Skye and left a voicemail.  Thanks

## 2020-03-30 NOTE — Telephone Encounter (Signed)
Contacted pt's wife and she informed of the MD's info to fax lab work. Lab work faxed to [phone].  Pt's hematology Doctor still needing you to contact her at [phone]. Dr. Yolanda Bonine

## 2020-03-30 NOTE — Telephone Encounter (Signed)
New message:   Pt's wife is calling and states the lab work still hasn't been sent to [facility] for the pt. Please advise.

## 2020-03-31 ENCOUNTER — Other Ambulatory Visit: Payer: Self-pay | Admitting: Internal Medicine

## 2020-03-31 DIAGNOSIS — G7000 Myasthenia gravis without (acute) exacerbation: Secondary | ICD-10-CM | POA: Diagnosis not present

## 2020-03-31 DIAGNOSIS — D508 Other iron deficiency anemias: Secondary | ICD-10-CM | POA: Diagnosis not present

## 2020-04-01 DIAGNOSIS — D508 Other iron deficiency anemias: Secondary | ICD-10-CM | POA: Diagnosis not present

## 2020-04-01 DIAGNOSIS — G7000 Myasthenia gravis without (acute) exacerbation: Secondary | ICD-10-CM | POA: Diagnosis not present

## 2020-04-02 ENCOUNTER — Ambulatory Visit: Payer: Medicare Other | Admitting: Internal Medicine

## 2020-04-02 DIAGNOSIS — D509 Iron deficiency anemia, unspecified: Secondary | ICD-10-CM | POA: Diagnosis not present

## 2020-04-02 DIAGNOSIS — G7000 Myasthenia gravis without (acute) exacerbation: Secondary | ICD-10-CM | POA: Diagnosis not present

## 2020-04-02 DIAGNOSIS — D508 Other iron deficiency anemias: Secondary | ICD-10-CM | POA: Diagnosis not present

## 2020-04-02 DIAGNOSIS — K5520 Angiodysplasia of colon without hemorrhage: Secondary | ICD-10-CM | POA: Diagnosis not present

## 2020-04-02 DIAGNOSIS — K31819 Angiodysplasia of stomach and duodenum without bleeding: Secondary | ICD-10-CM | POA: Diagnosis not present

## 2020-04-05 ENCOUNTER — Other Ambulatory Visit (INDEPENDENT_AMBULATORY_CARE_PROVIDER_SITE_OTHER): Payer: Medicare Other

## 2020-04-05 ENCOUNTER — Other Ambulatory Visit: Payer: Self-pay

## 2020-04-05 DIAGNOSIS — D509 Iron deficiency anemia, unspecified: Secondary | ICD-10-CM | POA: Diagnosis not present

## 2020-04-05 DIAGNOSIS — I10 Essential (primary) hypertension: Secondary | ICD-10-CM

## 2020-04-05 LAB — CBC WITH DIFFERENTIAL/PLATELET
Basophils Absolute: 0 10*3/uL (ref 0.0–0.1)
Basophils Relative: 0.5 % (ref 0.0–3.0)
Eosinophils Absolute: 0.2 10*3/uL (ref 0.0–0.7)
Eosinophils Relative: 2.2 % (ref 0.0–5.0)
HCT: 30 % — ABNORMAL LOW (ref 39.0–52.0)
Hemoglobin: 9.3 g/dL — ABNORMAL LOW (ref 13.0–17.0)
Lymphocytes Relative: 13.3 % (ref 12.0–46.0)
Lymphs Abs: 1.2 10*3/uL (ref 0.7–4.0)
MCHC: 31.1 g/dL (ref 30.0–36.0)
MCV: 78.1 fl (ref 78.0–100.0)
Monocytes Absolute: 1 10*3/uL (ref 0.1–1.0)
Monocytes Relative: 11.7 % (ref 3.0–12.0)
Neutro Abs: 6.4 10*3/uL (ref 1.4–7.7)
Neutrophils Relative %: 72.3 % (ref 43.0–77.0)
Platelets: 258 10*3/uL (ref 150.0–400.0)
RBC: 3.84 Mil/uL — ABNORMAL LOW (ref 4.22–5.81)
RDW: 21.3 % — ABNORMAL HIGH (ref 11.5–15.5)
WBC: 8.9 10*3/uL (ref 4.0–10.5)

## 2020-04-05 LAB — BASIC METABOLIC PANEL
BUN: 14 mg/dL (ref 6–23)
CO2: 29 mEq/L (ref 19–32)
Calcium: 9.4 mg/dL (ref 8.4–10.5)
Chloride: 106 mEq/L (ref 96–112)
Creatinine, Ser: 0.91 mg/dL (ref 0.40–1.50)
GFR: 80.35 mL/min (ref >60.00)
Glucose, Bld: 100 mg/dL — ABNORMAL HIGH (ref 70–99)
Potassium: 3.3 mEq/L — ABNORMAL LOW (ref 3.5–5.1)
Sodium: 142 mEq/L (ref 135–145)

## 2020-04-05 LAB — IBC PANEL
Iron: 61 ug/dL (ref 42–165)
Saturation Ratios: 15.8 % — ABNORMAL LOW (ref 20.0–50.0)
Transferrin: 276 mg/dL (ref 212.0–360.0)

## 2020-04-09 ENCOUNTER — Encounter: Payer: Self-pay | Admitting: Internal Medicine

## 2020-04-09 ENCOUNTER — Other Ambulatory Visit: Payer: Self-pay

## 2020-04-09 ENCOUNTER — Ambulatory Visit (INDEPENDENT_AMBULATORY_CARE_PROVIDER_SITE_OTHER): Payer: Medicare Other | Admitting: Internal Medicine

## 2020-04-09 VITALS — BP 112/68 | HR 64 | Temp 98.7°F | Ht 72.0 in | Wt 235.0 lb

## 2020-04-09 DIAGNOSIS — E538 Deficiency of other specified B group vitamins: Secondary | ICD-10-CM | POA: Diagnosis not present

## 2020-04-09 DIAGNOSIS — D500 Iron deficiency anemia secondary to blood loss (chronic): Secondary | ICD-10-CM | POA: Diagnosis not present

## 2020-04-09 DIAGNOSIS — Z23 Encounter for immunization: Secondary | ICD-10-CM | POA: Diagnosis not present

## 2020-04-09 DIAGNOSIS — R29898 Other symptoms and signs involving the musculoskeletal system: Secondary | ICD-10-CM

## 2020-04-09 DIAGNOSIS — I10 Essential (primary) hypertension: Secondary | ICD-10-CM | POA: Diagnosis not present

## 2020-04-09 DIAGNOSIS — G7000 Myasthenia gravis without (acute) exacerbation: Secondary | ICD-10-CM

## 2020-04-09 DIAGNOSIS — I2510 Atherosclerotic heart disease of native coronary artery without angina pectoris: Secondary | ICD-10-CM | POA: Diagnosis not present

## 2020-04-09 NOTE — Assessment & Plan Note (Addendum)
Cellcept Prednisone 15 mg alt w/10 mg

## 2020-04-09 NOTE — Assessment & Plan Note (Signed)
Off ASA due to GI bleed  

## 2020-04-09 NOTE — Assessment & Plan Note (Signed)
MG

## 2020-04-09 NOTE — Progress Notes (Signed)
Subjective:  Patient ID: [name], male    DOB: 1940/12/04  Age: 79 y.o. MRN: [id number]  CC: No chief complaint on file.   HPI Osin[place] Navarrette Ziebarth presents for anemia - he was admitted to [facility], had a push endoscopy, 2 RBC transfusions. F/u MG, CAD   Outpatient Medications Prior to Visit  Medication Sig Dispense Refill  . Cholecalciferol (VITAMIN D3) 2000 units TABS Take 2,000 Units by mouth at bedtime.    . Coenzyme Q10 (CO Q-10 PO) Take 1 tablet by mouth at bedtime.    . Cyanocobalamin (VITAMIN B-12) 1000 MCG SUBL Place 1 tablet (1,000 mcg total) under the tongue daily. (Patient taking differently: Place 1,000 mcg under the tongue at bedtime. ) 100 tablet 3  . donepezil (ARICEPT) 10 MG tablet Take 1 tablet (10 mg total) by mouth at bedtime. 90 tablet 3  . famotidine (PEPCID) 40 MG tablet TAKE 1 TABLET BY MOUTH  DAILY 90 tablet 3  . finasteride (PROSCAR) 5 MG tablet Take 1 tablet (5 mg total) by mouth daily. 90 tablet 3  . furosemide (LASIX) 20 MG tablet TAKE 1 -2 TABLET (20 MG) BY  MOUTH DAILY IN THE EVENING 90 tablet 3  . mycophenolate (CELLCEPT) 500 MG tablet Take 1,000 mg by mouth 2 (two) times daily.    Vladimir Faster Glycol-Propyl Glycol (LUBRICANT EYE DROPS) 0.4-0.3 % SOLN Place 1-2 drops into both eyes daily.    . potassium chloride (KLOR-CON) 10 MEQ tablet Take 1 tablet (10 mEq total) by mouth 2 (two) times daily. 180 tablet 1  . predniSONE (DELTASONE) 10 MG tablet 25 mg qod 20 mg qod (Patient taking differently: Take 15 mg by mouth daily. 15 mg po once a day (rx from [facility])) 100 tablet 3  . pyridostigmine (MESTINON) 60 MG tablet One in AM , one at lunch, 2 at bedtime. (Patient taking differently: one at lunch) 450 tablet 3  . sertraline (ZOLOFT) 100 MG tablet Take 1 tablet (100 mg total) by mouth daily. 90 tablet 3  . tamsulosin (FLOMAX) 0.4 MG CAPS capsule Take 2 capsules (0.8 mg total) by mouth daily. 180 capsule 3  . ipratropium (ATROVENT) 0.06 % nasal spray  Place 2 sprays into the nose 3 (three) times daily. 15 mL 2  . mycophenolate (CELLCEPT) 250 MG capsule Take 1,000 mg by mouth 2 (two) times daily.     No facility-administered medications prior to visit.    ROS: Review of Systems  Constitutional: Positive for fatigue. Negative for appetite change and unexpected weight change.  HENT: Negative for congestion, nosebleeds, sneezing, sore throat and trouble swallowing.   Eyes: Negative for itching and visual disturbance.  Respiratory: Negative for cough.   Cardiovascular: Negative for chest pain, palpitations and leg swelling.  Gastrointestinal: Negative for abdominal distention, blood in stool, diarrhea and nausea.  Genitourinary: Negative for frequency and hematuria.  Musculoskeletal: Positive for arthralgias. Negative for back pain, gait problem, joint swelling and neck pain.  Skin: Negative for rash.  Neurological: Negative for dizziness, tremors, speech difficulty and weakness.  Psychiatric/Behavioral: Positive for decreased concentration. Negative for agitation, dysphoric mood, sleep disturbance and suicidal ideas. The patient is not nervous/anxious.     Objective:  BP 112/68 (BP Location: Left Arm, Patient Position: Sitting, Cuff Size: Large)   Pulse 64   Temp 98.7 F (37.1 C) (Oral)   Ht 6' (1.829 m)   Wt 235 lb (106.6 kg)   SpO2 97%   BMI 31.87 kg/m  BP Readings from Last 3 Encounters:  04/09/20 112/68  11/29/19 126/70  08/31/19 122/72    Wt Readings from Last 3 Encounters:  04/09/20 235 lb (106.6 kg)  11/29/19 248 lb 4 oz (112.6 kg)  08/31/19 254 lb (115.2 kg)    Physical Exam Constitutional:      General: He is not in acute distress.    Appearance: He is well-developed.     Comments: NAD  Eyes:     Conjunctiva/sclera: Conjunctivae normal.     Pupils: Pupils are equal, round, and reactive to light.  Neck:     Thyroid: No thyromegaly.     Vascular: No JVD.  Cardiovascular:     Rate and Rhythm: Normal rate  and regular rhythm.     Heart sounds: Normal heart sounds. No murmur heard.  No friction rub. No gallop.   Pulmonary:     Effort: Pulmonary effort is normal. No respiratory distress.     Breath sounds: Normal breath sounds. No wheezing or rales.  Chest:     Chest wall: No tenderness.  Abdominal:     General: Bowel sounds are normal. There is no distension.     Palpations: Abdomen is soft. There is no mass.     Tenderness: There is no abdominal tenderness. There is no guarding or rebound.  Musculoskeletal:        General: No tenderness. Normal range of motion.     Cervical back: Normal range of motion.  Lymphadenopathy:     Cervical: No cervical adenopathy.  Skin:    General: Skin is warm and dry.     Findings: No rash.  Neurological:     Mental Status: He is alert and oriented to person, place, and time.     Cranial Nerves: No cranial nerve deficit.     Motor: No abnormal muscle tone.     Coordination: Coordination normal.     Gait: Gait normal.     Deep Tendon Reflexes: Reflexes are normal and symmetric.  Psychiatric:        Behavior: Behavior normal.        Thought Content: Thought content normal.     Lab Results  Component Value Date   WBC 8.9 04/05/2020   HGB 9.3 (L) 04/05/2020   HCT 30.0 (L) 04/05/2020   PLT 258.0 04/05/2020   GLUCOSE 100 (H) 04/05/2020   CHOL 182 12/01/2017   TRIG 174 (H) 12/01/2017   HDL 48 12/01/2017   LDLDIRECT 157.5 07/05/2008   LDLCALC 99 12/01/2017   ALT 19 01/13/2019   AST 11 01/13/2019   NA 142 04/05/2020   K 3.3 (L) 04/05/2020   CL 106 04/05/2020   CREATININE 0.91 04/05/2020   BUN 14 04/05/2020   CO2 29 04/05/2020   TSH 1.17 05/23/2019   PSA 0.25 05/23/2019   INR 0.99 11/30/2017   HGBA1C 5.5 02/05/2018    No results found.  Assessment & Plan:    [name], MD

## 2020-04-09 NOTE — Assessment & Plan Note (Addendum)
F/u w/Dr Quita Skye CBC Iron

## 2020-04-09 NOTE — Assessment & Plan Note (Signed)
On B12 

## 2020-04-17 ENCOUNTER — Other Ambulatory Visit: Payer: Self-pay | Admitting: Internal Medicine

## 2020-04-20 ENCOUNTER — Other Ambulatory Visit (INDEPENDENT_AMBULATORY_CARE_PROVIDER_SITE_OTHER): Payer: Medicare Other

## 2020-04-20 ENCOUNTER — Other Ambulatory Visit: Payer: Self-pay

## 2020-04-20 DIAGNOSIS — I10 Essential (primary) hypertension: Secondary | ICD-10-CM | POA: Diagnosis not present

## 2020-04-20 DIAGNOSIS — D509 Iron deficiency anemia, unspecified: Secondary | ICD-10-CM | POA: Diagnosis not present

## 2020-04-20 LAB — CBC WITH DIFFERENTIAL/PLATELET
Basophils Absolute: 0.2 10*3/uL — ABNORMAL HIGH (ref 0.0–0.1)
Basophils Relative: 1.7 % (ref 0.0–3.0)
Eosinophils Absolute: 0.1 10*3/uL (ref 0.0–0.7)
Eosinophils Relative: 0.6 % (ref 0.0–5.0)
HCT: 38.1 % — ABNORMAL LOW (ref 39.0–52.0)
Hemoglobin: 11.5 g/dL — ABNORMAL LOW (ref 13.0–17.0)
Lymphocytes Relative: 8.1 % — ABNORMAL LOW (ref 12.0–46.0)
Lymphs Abs: 0.9 10*3/uL (ref 0.7–4.0)
MCHC: 30.1 g/dL (ref 30.0–36.0)
MCV: 83.1 fl (ref 78.0–100.0)
Monocytes Absolute: 0.8 10*3/uL (ref 0.1–1.0)
Monocytes Relative: 7.6 % (ref 3.0–12.0)
Neutro Abs: 9 10*3/uL — ABNORMAL HIGH (ref 1.4–7.7)
Neutrophils Relative %: 82 % — ABNORMAL HIGH (ref 43.0–77.0)
Platelets: 247 10*3/uL (ref 150.0–400.0)
RBC: 4.58 Mil/uL (ref 4.22–5.81)
RDW: 23.4 % — ABNORMAL HIGH (ref 11.5–15.5)
WBC: 11 10*3/uL — ABNORMAL HIGH (ref 4.0–10.5)

## 2020-04-20 LAB — BASIC METABOLIC PANEL
BUN: 20 mg/dL (ref 6–23)
CO2: 31 mEq/L (ref 19–32)
Calcium: 9.6 mg/dL (ref 8.4–10.5)
Chloride: 103 mEq/L (ref 96–112)
Creatinine, Ser: 0.82 mg/dL (ref 0.40–1.50)
GFR: 90.61 mL/min (ref >60.00)
Glucose, Bld: 116 mg/dL — ABNORMAL HIGH (ref 70–99)
Potassium: 4.3 mEq/L (ref 3.5–5.1)
Sodium: 141 mEq/L (ref 135–145)

## 2020-04-20 LAB — IBC PANEL
Iron: 38 ug/dL — ABNORMAL LOW (ref 42–165)
Saturation Ratios: 9.4 % — ABNORMAL LOW (ref 20.0–50.0)
Transferrin: 289 mg/dL (ref 212.0–360.0)

## 2020-04-23 ENCOUNTER — Emergency Department (HOSPITAL_COMMUNITY): Payer: Medicare Other

## 2020-04-23 ENCOUNTER — Emergency Department (HOSPITAL_COMMUNITY)
Admission: EM | Admit: 2020-04-23 | Discharge: 2020-04-23 | Disposition: A | Discharge status: Home / Self Care | Payer: Medicare Other | Attending: Emergency Medicine | Admitting: Emergency Medicine

## 2020-04-23 ENCOUNTER — Emergency Department (HOSPITAL_BASED_OUTPATIENT_CLINIC_OR_DEPARTMENT_OTHER): Payer: Medicare Other

## 2020-04-23 ENCOUNTER — Other Ambulatory Visit: Payer: Self-pay

## 2020-04-23 ENCOUNTER — Encounter (HOSPITAL_COMMUNITY): Payer: Self-pay

## 2020-04-23 DIAGNOSIS — R0789 Other chest pain: Secondary | ICD-10-CM | POA: Diagnosis not present

## 2020-04-23 DIAGNOSIS — F0281 Dementia in other diseases classified elsewhere with behavioral disturbance: Secondary | ICD-10-CM | POA: Insufficient documentation

## 2020-04-23 DIAGNOSIS — G309 Alzheimer's disease, unspecified: Secondary | ICD-10-CM | POA: Diagnosis not present

## 2020-04-23 DIAGNOSIS — I708 Atherosclerosis of other arteries: Secondary | ICD-10-CM | POA: Diagnosis not present

## 2020-04-23 DIAGNOSIS — R600 Localized edema: Secondary | ICD-10-CM

## 2020-04-23 DIAGNOSIS — Z87891 Personal history of nicotine dependence: Secondary | ICD-10-CM | POA: Insufficient documentation

## 2020-04-23 DIAGNOSIS — R0602 Shortness of breath: Secondary | ICD-10-CM | POA: Insufficient documentation

## 2020-04-23 DIAGNOSIS — Z96653 Presence of artificial knee joint, bilateral: Secondary | ICD-10-CM | POA: Insufficient documentation

## 2020-04-23 DIAGNOSIS — Z96612 Presence of left artificial shoulder joint: Secondary | ICD-10-CM | POA: Diagnosis not present

## 2020-04-23 DIAGNOSIS — R2243 Localized swelling, mass and lump, lower limb, bilateral: Secondary | ICD-10-CM | POA: Insufficient documentation

## 2020-04-23 DIAGNOSIS — I10 Essential (primary) hypertension: Secondary | ICD-10-CM | POA: Insufficient documentation

## 2020-04-23 DIAGNOSIS — Z8673 Personal history of transient ischemic attack (TIA), and cerebral infarction without residual deficits: Secondary | ICD-10-CM | POA: Diagnosis not present

## 2020-04-23 DIAGNOSIS — M25462 Effusion, left knee: Secondary | ICD-10-CM | POA: Diagnosis not present

## 2020-04-23 DIAGNOSIS — M7989 Other specified soft tissue disorders: Secondary | ICD-10-CM

## 2020-04-23 DIAGNOSIS — Z79899 Other long term (current) drug therapy: Secondary | ICD-10-CM | POA: Insufficient documentation

## 2020-04-23 DIAGNOSIS — R079 Chest pain, unspecified: Secondary | ICD-10-CM | POA: Diagnosis not present

## 2020-04-23 DIAGNOSIS — Z96652 Presence of left artificial knee joint: Secondary | ICD-10-CM | POA: Diagnosis not present

## 2020-04-23 DIAGNOSIS — Z7952 Long term (current) use of systemic steroids: Secondary | ICD-10-CM | POA: Diagnosis not present

## 2020-04-23 LAB — CBC
HCT: 39.9 % (ref 39.0–52.0)
Hemoglobin: 11.4 g/dL — ABNORMAL LOW (ref 13.0–17.0)
MCH: 24.7 pg — ABNORMAL LOW (ref 26.0–34.0)
MCHC: 28.6 g/dL — ABNORMAL LOW (ref 30.0–36.0)
MCV: 86.6 fL (ref 80.0–100.0)
Platelets: 318 10*3/uL (ref 150–400)
RBC: 4.61 MIL/uL (ref 4.22–5.81)
RDW: 20.6 % — ABNORMAL HIGH (ref 11.5–15.5)
WBC: 8.7 10*3/uL (ref 4.0–10.5)
nRBC: 0 % (ref 0.0–0.2)

## 2020-04-23 LAB — BASIC METABOLIC PANEL
Anion gap: 10 (ref 5–15)
BUN: 18 mg/dL (ref 8–23)
CO2: 27 mmol/L (ref 22–32)
Calcium: 9.4 mg/dL (ref 8.9–10.3)
Chloride: 103 mmol/L (ref 98–111)
Creatinine, Ser: 0.71 mg/dL (ref 0.61–1.24)
GFR calc Af Amer: >60 mL/min (ref >60)
GFR calc non Af Amer: >60 mL/min (ref >60)
Glucose, Bld: 109 mg/dL — ABNORMAL HIGH (ref 70–99)
Potassium: 3.7 mmol/L (ref 3.5–5.1)
Sodium: 140 mmol/L (ref 135–145)

## 2020-04-23 LAB — TROPONIN I (HIGH SENSITIVITY): Troponin I (High Sensitivity): 4 ng/L (ref <18)

## 2020-04-23 NOTE — Progress Notes (Signed)
Bilateral lower extremity venous duplex has been completed. Preliminary results can be found in CV Proc through chart review.  Results were given to Dr. Stark Jock.  04/23/20 3:43 PM [name] RVT

## 2020-04-23 NOTE — Discharge Instructions (Addendum)
Increase your dose of Lasix to 20 mg twice daily for the next 5 days.  Wear compression stockings and elevate your feet as much as possible.  Follow-up with primary doctor in the next week, and return to the ER if you develop severe chest pain, worsening breathing, worsening leg swelling, or other new and concerning symptoms.

## 2020-04-23 NOTE — ED Notes (Signed)
Pt verbalized d/c instructions and follow up care. Alert and ambulatory. No iv. Leaving with wife

## 2020-04-23 NOTE — ED Provider Notes (Signed)
[facility] DEPT Provider Note   CSN: [id number] Arrival date & time: 04/23/20  1041     History Chief Complaint  Patient presents with  . Chest Pain  . Joint Swelling    [name] is a 79 y.o. male.  Patient is a 79 year old male with past medical history of chronic anemia, dementia, hypertension, hyperlipidemia.  He presents today for evaluation of leg swelling.  According to the patient's wife, he has been reporting pain in his legs for the past couple of nights.  She believes that the left leg is more swollen than the left and is concerned about a blood clot.  Patient denies to me that he is experiencing any chest pain or difficulty breathing.  He denies any fevers, chills, or cough.  Patient has history of dementia and currently has no complaints.  He is unsure as to why he is here today.  Wife provides majority of the history.  The history is provided by the patient.       Past Medical History:  Diagnosis Date  . Anemia   . Blood transfusion without reported diagnosis 02/23/2014   3 units for iron deficiency anemia. as a baby - whenhe had pneumonia  . BPH (benign prostatic hyperplasia)   . CTS (carpal tunnel syndrome)    better  . Dyspnea   . GERD (gastroesophageal reflux disease)   . Hyperlipidemia   . Hypertension    "THEY DIAGNOSED ME YEARS AGO BUT LATELY IVE ACTUALLY HAD LOW BLOOD PRESSURES "   . Insomnia   . LBP (low back pain)   . Osteoarthritis   . Pneumonia    as a baby  . Sleep apnea    no cpap    Patient Active Problem List   Diagnosis Date Noted  . Alzheimer disease ([place]) 11/29/2019  . Depressed mood 05/23/2019  . GERD (gastroesophageal reflux disease) 09/09/2018  . Chronic coughing 06/07/2018  . Hypokalemia 04/16/2018  . History of plasmapheresis 04/12/2018  . MCI (mild cognitive impairment) 04/12/2018  . Myasthenia exacerbation ([place]) 04/12/2018  . Weakness of both lower extremities 01/13/2018  .  Myasthenia gravis, AChR antibody positive ([place]) 01/13/2018  . Dysarthria 12/04/2017  . Dysphagia   . CVA (cerebral vascular accident) ([place]) 11/30/2017  . Angio-edema 11/26/2017  . S/P knee replacement 09/25/2017  . Knee pain, chronic 08/14/2017  . OSA on CPAP 03/05/2017  . OSA (obstructive sleep apnea) 09/30/2016  . S/P shoulder replacement, left 07/04/2016  . Preop exam for internal medicine 04/16/2016  . Claudication ([place]) 09/14/2015  . Pain in the chest   . Abnormal stress test 08/14/2015  . Dyspnea 07/23/2015  . Paresthesia 10/25/2014  . Weakness 04/13/2014  . Heme positive stool 02/22/2014  . Benign prostatic hyperplasia 02/22/2014  . B12 deficiency 02/21/2014  . Anemia, iron deficiency 02/21/2014  . Leg weakness, bilateral 01/24/2014  . Erectile dysfunction 01/24/2014  . Edema 01/24/2014  . URI, acute 08/31/2013  . Cellulitis of arm, right 07/13/2013  . Grief 07/13/2013  . Well adult exam 03/01/2012  . CARPAL TUNNEL SYNDROME 11/04/2010  . BENIGN PROSTATIC HYPERTROPHY, WITH URINARY OBSTRUCTION 11/04/2010  . WRIST PAIN 11/04/2010  . LEG PAIN 07/16/2010  . COUGH 05/20/2010  . TOBACCO USE, QUIT 07/04/2009  . WARTS, VIRAL, UNSPECIFIED 11/06/2008  . INSOMNIA, PERSISTENT 01/14/2008  . Essential hypertension 01/14/2008  . RHINITIS 01/14/2008  . ABDOMINAL PAIN 01/14/2008  . TREMOR 09/07/2007  . Actinic keratosis 08/12/2007  . Neoplasm of uncertain behavior of  skin 07/06/2007  . LOW BACK PAIN 07/06/2007  . Dyslipidemia 06/05/2007  . Coronary atherosclerosis 06/05/2007  . Osteoarthritis 06/05/2007    Past Surgical History:  Procedure Laterality Date  . CARDIAC CATHETERIZATION N/A 08/15/2015   Procedure: Left Heart Cath and Coronary Angiography;  Surgeon: Lorretta Harp, MD;  Location: [facility];  Service: Cardiovascular;  Laterality: N/A;  . CARPAL TUNNEL RELEASE Bilateral 1996, 2004   Dr Lenoard Aden thumb surgery.  more than once  . COLONOSCOPY  2005, 2012     diverticulosis.   Marland Kitchen ESOPHAGOGASTRODUODENOSCOPY N/A 02/23/2014   Procedure: ESOPHAGOGASTRODUODENOSCOPY (EGD);  Surgeon: Irene Shipper, MD;  Location: [facility];  Service: Endoscopy;  Laterality: N/A;  . EYE SURGERY  2012   both cataracts, Lasik  . LEFT KNEE ARTHROSCOPY Left 07/16/2017    [facility]   . LUMBAR FUSION     x 3  . REVERSE SHOULDER ARTHROPLASTY Left 07/04/2016   Procedure: LEFT REVERSE SHOULDER ARTHROPLASTY;  Surgeon: Netta Cedars, MD;  Location: [facility];  Service: Orthopedics;  Laterality: Left;  . TONSILLECTOMY  1946  . TOTAL KNEE ARTHROPLASTY  2003   Right  . TOTAL KNEE ARTHROPLASTY Left 09/25/2017   Procedure: LEFT TOTAL KNEE ARTHROPLASTY;  Surgeon: Sydnee Cabal, MD;  Location: [facility];  Service: Orthopedics;  Laterality: Left;       Family History  Problem Relation Age of Onset  . Aneurysm Father        AAA  . Heart disease Father 69       CHF  . Alzheimer's disease Father   . Cancer Brother   . Alzheimer's disease Brother        dementia  . Stomach cancer Brother   . Cancer Mother 100       breast cancer  . Heart disease Mother 45       MI  . Autoimmune disease Son   . CAD Brother   . Colon cancer Neg Hx   . Esophageal cancer Neg Hx   . Pancreatic cancer Neg Hx   . Prostate cancer Neg Hx   . Rectal cancer Neg Hx     Social History   Tobacco Use  . Smoking status: Former Smoker    Quit date: 01/08/1987    Years since quitting: 33.3  . Smokeless tobacco: Never Used  Vaping Use  . Vaping Use: Never used  Substance Use Topics  . Alcohol use: Yes    Alcohol/week: 0.0 standard drinks    Comment: rare  . Drug use: No    Home Medications Prior to Admission medications   Medication Sig Start Date End Date Taking? Authorizing Provider  Cholecalciferol (VITAMIN D3) 2000 units TABS Take 2,000 Units by mouth at bedtime.    [provider]  Coenzyme Q10 (CO Q-10 PO) Take 1 tablet by mouth at bedtime.    [provider]  Cyanocobalamin  (VITAMIN B-12) 1000 MCG SUBL Place 1 tablet (1,000 mcg total) under the tongue daily. Patient taking differently: Place 1,000 mcg under the tongue at bedtime.  02/21/14   [name], [name], MD  donepezil (ARICEPT) 10 MG tablet Take 1 tablet (10 mg total) by mouth at bedtime. 08/31/19 08/30/20  [name], [name], MD  famotidine (PEPCID) 40 MG tablet TAKE 1 TABLET BY MOUTH  DAILY 04/02/20   [name], [name], MD  finasteride (PROSCAR) 5 MG tablet Take 1 tablet (5 mg total) by mouth daily. 09/27/19   [name], [name], MD  furosemide (  LASIX) 20 MG tablet TAKE 1 -2 TABLET (20 MG) BY  MOUTH DAILY IN THE EVENING 09/27/19   [name], [name], MD  ipratropium (ATROVENT) 0.06 % nasal spray Place 2 sprays into the nose 3 (three) times daily. 06/03/18 06/03/19  [name], [name], MD  mycophenolate (CELLCEPT) 250 MG capsule Take 1,000 mg by mouth 2 (two) times daily.    [provider]  mycophenolate (CELLCEPT) 500 MG tablet Take 1,000 mg by mouth 2 (two) times daily. 11/15/19   [provider]  PARoxetine (PAXIL) 20 MG tablet TAKE 1 TABLET BY MOUTH EVERY DAY 04/17/20   [name], [name], MD  Polyethyl Glycol-Propyl Glycol (LUBRICANT EYE DROPS) 0.4-0.3 % SOLN Place 1-2 drops into both eyes daily.    [provider]  potassium chloride (KLOR-CON) 10 MEQ tablet Take 1 tablet (10 mEq total) by mouth 2 (two) times daily. 02/15/20   [name], [name], MD  predniSONE (DELTASONE) 10 MG tablet 25 mg qod 20 mg qod Patient taking differently: Take 15 mg by mouth daily. 15 mg po once a day (rx from [facility]) 01/13/19   [name], [name], MD  pyridostigmine (MESTINON) 60 MG tablet One in AM , one at lunch, 2 at bedtime. Patient taking differently: one at lunch 04/12/18   Dohmeier, Asencion Partridge, MD  sertraline (ZOLOFT) 100 MG tablet Take 1 tablet (100 mg total) by mouth daily. 11/29/19 11/28/20  [name], [name], MD  tamsulosin (FLOMAX) 0.4 MG CAPS capsule Take 2 capsules (0.8 mg total)  by mouth daily. 09/27/19   [name], [name], MD    Allergies    Ferumoxytol  Review of Systems   Review of Systems  All other systems reviewed and are negative.   Physical Exam Updated Vital Signs BP (!) 154/143 (BP Location: Left Arm) Comment: pt was moving around will check again  Pulse 69   Temp 98 F (36.7 C) (Oral)   Resp (!) 24   Ht 6' (1.829 m)   Wt 109.8 kg   SpO2 99%   BMI 32.82 kg/m   Physical Exam Vitals and nursing note reviewed.  Constitutional:      General: He is not in acute distress.    Appearance: He is well-developed. He is not diaphoretic.  HENT:     Head: Normocephalic and atraumatic.  Cardiovascular:     Rate and Rhythm: Normal rate and regular rhythm.     Heart sounds: No murmur heard.  No friction rub.  Pulmonary:     Effort: Pulmonary effort is normal. No respiratory distress.     Breath sounds: Normal breath sounds. No wheezing or rales.  Abdominal:     General: Bowel sounds are normal. There is no distension.     Palpations: Abdomen is soft.     Tenderness: There is no abdominal tenderness.  Musculoskeletal:        General: Normal range of motion.     Cervical back: Normal range of motion and neck supple.     Right lower leg: Tenderness present. Edema present.     Left lower leg: Tenderness present. Edema present.     Comments: There is bilateral lower extremity edema, the left greater than right.  There is no calf tenderness.  Bevelyn Buckles' sign is absent.  DP pulses are easily palpable bilaterally.  Skin:    General: Skin is warm and dry.  Neurological:     Mental Status: He is alert and oriented to person, place, and time.  Coordination: Coordination normal.     ED Results / Procedures / Treatments   Labs (all labs ordered are listed, but only abnormal results are displayed) Labs Reviewed  BASIC METABOLIC PANEL - Abnormal; Notable for the following components:      Result Value   Glucose, Bld 109 (*)    All other components  within normal limits  CBC - Abnormal; Notable for the following components:   Hemoglobin 11.4 (*)    MCH 24.7 (*)    MCHC 28.6 (*)    RDW 20.6 (*)    All other components within normal limits  TROPONIN I (HIGH SENSITIVITY)  TROPONIN I (HIGH SENSITIVITY)    EKG None  Radiology DG Chest 2 View  Result Date: 04/23/2020 CLINICAL DATA:  Chest pain chest pain.  Shortness of breath EXAM: CHEST - 2 VIEW COMPARISON:  12/02/2017 CT.  Chest radiograph 11/30/2017 FINDINGS: Midline trachea. Normal heart size. Atherosclerosis in the transverse aorta. Moderate left hemidiaphragm elevation is similar. No pleural effusion or pneumothorax. Clear lungs. Left shoulder arthroplasty. Remote left rib fractures. IMPRESSION: No acute cardiopulmonary disease. Chronic left hemidiaphragm elevation. Electronically Signed   By: Abigail Miyamoto M.D.   On: 04/23/2020 12:00   DG Knee Complete 4 Views Left  Result Date: 04/23/2020 CLINICAL DATA:  Left knee pain and swelling.  No recent injury. EXAM: LEFT KNEE - COMPLETE 4+ VIEW COMPARISON:  None. FINDINGS: Total knee replacement in satisfactory position and alignment. No loosening. Negative for fracture. Mild joint effusion. Mild arterial calcification. IMPRESSION: Small left effusion. Negative for fracture. Knee prosthesis in good position without loosening. Electronically Signed   By: Franchot Gallo M.D.   On: 04/23/2020 12:00    Procedures Procedures (including critical care time)  Medications Ordered in ED Medications - No data to display  ED Course  I have reviewed the triage vital signs and the nursing notes.  Pertinent labs & imaging results that were available during my care of the patient were reviewed by me and considered in my medical decision making (see chart for details).    MDM Rules/Calculators/A&P  Patient is a 79 year old male presenting with complaints of leg swelling.  Wife is also reported chest tightness and shortness of breath intermittently  for the past few days, however he denies this to me.  He tells me he is not experiencing any discomfort at present.  Patient was sent here for further evaluation of these complaints.  He does have some swelling to both lower legs, the left somewhat greater than the right, but ultrasounds are negative for DVT.  Patient's EKG is unchanged and troponin is negative.  His oxygen saturations are 99 to 100% with a heart rate in the 50s and 60s.  I highly doubt pulmonary embolism.  At this point, I feel as though patient is appropriate for discharge.  He does take Lasix and I will advised the wife to increase this to twice daily.  He is to follow-up with primary doctor in the next week and return if he worsens.  Final Clinical Impression(s) / ED Diagnoses Final diagnoses:  None    Rx / DC Orders ED Discharge Orders    None       Veryl Speak, MD 04/23/20 1624

## 2020-04-23 NOTE — ED Triage Notes (Signed)
Patient c/o intermittent mid chest pain and SOB x 3 days. Patient also c/o left knee swelling. Patient states he always has swelling, but seems worse.

## 2020-04-25 DIAGNOSIS — Z961 Presence of intraocular lens: Secondary | ICD-10-CM | POA: Diagnosis not present

## 2020-04-25 DIAGNOSIS — H04123 Dry eye syndrome of bilateral lacrimal glands: Secondary | ICD-10-CM | POA: Diagnosis not present

## 2020-04-25 DIAGNOSIS — H26492 Other secondary cataract, left eye: Secondary | ICD-10-CM | POA: Diagnosis not present

## 2020-04-25 DIAGNOSIS — H43813 Vitreous degeneration, bilateral: Secondary | ICD-10-CM | POA: Diagnosis not present

## 2020-04-27 DIAGNOSIS — D508 Other iron deficiency anemias: Secondary | ICD-10-CM | POA: Diagnosis not present

## 2020-04-27 DIAGNOSIS — D500 Iron deficiency anemia secondary to blood loss (chronic): Secondary | ICD-10-CM | POA: Diagnosis not present

## 2020-05-17 ENCOUNTER — Other Ambulatory Visit: Payer: Self-pay | Admitting: Internal Medicine

## 2020-06-10 ENCOUNTER — Other Ambulatory Visit: Payer: Self-pay | Admitting: Internal Medicine

## 2020-06-25 DIAGNOSIS — G4733 Obstructive sleep apnea (adult) (pediatric): Secondary | ICD-10-CM | POA: Diagnosis not present

## 2020-07-23 ENCOUNTER — Other Ambulatory Visit (INDEPENDENT_AMBULATORY_CARE_PROVIDER_SITE_OTHER): Payer: Medicare Other

## 2020-07-23 DIAGNOSIS — D509 Iron deficiency anemia, unspecified: Secondary | ICD-10-CM | POA: Diagnosis not present

## 2020-07-23 DIAGNOSIS — I10 Essential (primary) hypertension: Secondary | ICD-10-CM

## 2020-07-23 LAB — IBC PANEL
Iron: 78 ug/dL (ref 42–165)
Saturation Ratios: 21.9 % (ref 20.0–50.0)
Transferrin: 254 mg/dL (ref 212.0–360.0)

## 2020-07-23 LAB — BASIC METABOLIC PANEL
BUN: 26 mg/dL — ABNORMAL HIGH (ref 6–23)
CO2: 30 mEq/L (ref 19–32)
Calcium: 9.7 mg/dL (ref 8.4–10.5)
Chloride: 104 mEq/L (ref 96–112)
Creatinine, Ser: 0.75 mg/dL (ref 0.40–1.50)
GFR: 85.92 mL/min (ref >60.00)
Glucose, Bld: 77 mg/dL (ref 70–99)
Potassium: 3.4 mEq/L — ABNORMAL LOW (ref 3.5–5.1)
Sodium: 140 mEq/L (ref 135–145)

## 2020-07-23 LAB — CBC WITH DIFFERENTIAL/PLATELET
Basophils Absolute: 0.1 10*3/uL (ref 0.0–0.1)
Basophils Relative: 0.7 % (ref 0.0–3.0)
Eosinophils Absolute: 0.1 10*3/uL (ref 0.0–0.7)
Eosinophils Relative: 1 % (ref 0.0–5.0)
HCT: 45.2 % (ref 39.0–52.0)
Hemoglobin: 14.8 g/dL (ref 13.0–17.0)
Lymphocytes Relative: 15.9 % (ref 12.0–46.0)
Lymphs Abs: 1.3 10*3/uL (ref 0.7–4.0)
MCHC: 32.6 g/dL (ref 30.0–36.0)
MCV: 89 fl (ref 78.0–100.0)
Monocytes Absolute: 1 10*3/uL (ref 0.1–1.0)
Monocytes Relative: 12.1 % — ABNORMAL HIGH (ref 3.0–12.0)
Neutro Abs: 5.6 10*3/uL (ref 1.4–7.7)
Neutrophils Relative %: 70.3 % (ref 43.0–77.0)
Platelets: 218 10*3/uL (ref 150.0–400.0)
RBC: 5.08 Mil/uL (ref 4.22–5.81)
RDW: 17 % — ABNORMAL HIGH (ref 11.5–15.5)
WBC: 8 10*3/uL (ref 4.0–10.5)

## 2020-08-01 ENCOUNTER — Other Ambulatory Visit: Payer: Self-pay

## 2020-08-01 ENCOUNTER — Ambulatory Visit (INDEPENDENT_AMBULATORY_CARE_PROVIDER_SITE_OTHER): Payer: Medicare Other | Admitting: Internal Medicine

## 2020-08-01 ENCOUNTER — Encounter: Payer: Self-pay | Admitting: Internal Medicine

## 2020-08-01 DIAGNOSIS — I10 Essential (primary) hypertension: Secondary | ICD-10-CM | POA: Diagnosis not present

## 2020-08-01 DIAGNOSIS — I639 Cerebral infarction, unspecified: Secondary | ICD-10-CM | POA: Diagnosis not present

## 2020-08-01 DIAGNOSIS — E538 Deficiency of other specified B group vitamins: Secondary | ICD-10-CM | POA: Diagnosis not present

## 2020-08-01 DIAGNOSIS — F0280 Dementia in other diseases classified elsewhere without behavioral disturbance: Secondary | ICD-10-CM

## 2020-08-01 DIAGNOSIS — G309 Alzheimer's disease, unspecified: Secondary | ICD-10-CM

## 2020-08-01 DIAGNOSIS — G7000 Myasthenia gravis without (acute) exacerbation: Secondary | ICD-10-CM

## 2020-08-01 DIAGNOSIS — R29898 Other symptoms and signs involving the musculoskeletal system: Secondary | ICD-10-CM | POA: Diagnosis not present

## 2020-08-01 NOTE — Assessment & Plan Note (Signed)
On Zoloft.  Try Lions mane

## 2020-08-01 NOTE — Progress Notes (Signed)
Subjective:  Patient ID: [name], male    DOB: Jun 11, 1941  Age: 79 y.o. MRN: [id number]  CC: Follow-up (4 MONTH F/U)   HPI [name] presents for  MG, LBP, dementia f/u    Outpatient Medications Prior to Visit  Medication Sig Dispense Refill  . Cholecalciferol (VITAMIN D3) 2000 units TABS Take 2,000 Units by mouth at bedtime.    . Coenzyme Q10 (CO Q-10 PO) Take 1 tablet by mouth at bedtime.    . Cyanocobalamin (VITAMIN B-12) 1000 MCG SUBL Place 1 tablet (1,000 mcg total) under the tongue daily. (Patient taking differently: Place 1,000 mcg under the tongue at bedtime. ) 100 tablet 3  . donepezil (ARICEPT) 10 MG tablet Take 1 tablet (10 mg total) by mouth at bedtime. 90 tablet 3  . famotidine (PEPCID) 40 MG tablet TAKE 1 TABLET BY MOUTH  DAILY 90 tablet 3  . finasteride (PROSCAR) 5 MG tablet Take 1 tablet (5 mg total) by mouth daily. 90 tablet 3  . furosemide (LASIX) 20 MG tablet TAKE 1 TO 2 TABLETS BY  MOUTH DAILY IN THE EVENING 180 tablet 3  . mycophenolate (CELLCEPT) 500 MG tablet Take 1,000 mg by mouth 2 (two) times daily.    . potassium chloride (KLOR-CON) 10 MEQ tablet Take 1 tablet (10 mEq total) by mouth 2 (two) times daily. 180 tablet 1  . predniSONE (DELTASONE) 10 MG tablet 25 mg qod 20 mg qod (Patient taking differently: Take 15 mg by mouth daily. 15 mg po once a day (rx from [facility])) 100 tablet 3  . pyridostigmine (MESTINON) 60 MG tablet One in AM , one at lunch, 2 at bedtime. (Patient taking differently: one at lunch) 450 tablet 3  . sertraline (ZOLOFT) 100 MG tablet Take 1 tablet (100 mg total) by mouth daily. 90 tablet 3  . tamsulosin (FLOMAX) 0.4 MG CAPS capsule TAKE 2 CAPSULES BY MOUTH  DAILY 180 capsule 3  . ipratropium (ATROVENT) 0.06 % nasal spray Place 2 sprays into the nose 3 (three) times daily. 15 mL 2  . mycophenolate (CELLCEPT) 250 MG capsule Take 1,000 mg by mouth 2 (two) times daily.    Marland Kitchen PARoxetine (PAXIL) 20 MG tablet TAKE 1 TABLET  BY MOUTH EVERY DAY (Patient not taking: Reported on 08/01/2020) 90 tablet 3  . Polyethyl Glycol-Propyl Glycol (LUBRICANT EYE DROPS) 0.4-0.3 % SOLN Place 1-2 drops into both eyes daily. (Patient not taking: Reported on 08/01/2020)     No facility-administered medications prior to visit.    ROS: Review of Systems  Constitutional: Positive for fatigue. Negative for appetite change and unexpected weight change.  HENT: Negative for congestion, nosebleeds, sneezing, sore throat and trouble swallowing.   Eyes: Negative for itching and visual disturbance.  Respiratory: Negative for cough.   Cardiovascular: Negative for chest pain, palpitations and leg swelling.  Gastrointestinal: Negative for abdominal distention, blood in stool, diarrhea and nausea.  Genitourinary: Negative for frequency and hematuria.  Musculoskeletal: Negative for back pain, gait problem, joint swelling and neck pain.  Skin: Negative for rash.  Neurological: Negative for dizziness, tremors, speech difficulty and weakness.  Psychiatric/Behavioral: Positive for confusion and decreased concentration. Negative for agitation, dysphoric mood and sleep disturbance. The patient is not nervous/anxious.     Objective:  BP 124/72 (BP Location: Left Arm)   Pulse (!) 54   Temp 98 F (36.7 C) (Oral)   Wt 234 lb 6.4 oz (106.3 kg)   SpO2 94%   BMI 31.79 kg/m  BP Readings from Last 3 Encounters:  08/01/20 124/72  04/23/20 (!) 154/73  04/09/20 112/68    Wt Readings from Last 3 Encounters:  08/01/20 234 lb 6.4 oz (106.3 kg)  04/23/20 242 lb (109.8 kg)  04/09/20 235 lb (106.6 kg)    Physical Exam Constitutional:      General: He is not in acute distress.    Appearance: He is well-developed. He is obese.     Comments: NAD  Eyes:     Conjunctiva/sclera: Conjunctivae normal.     Pupils: Pupils are equal, round, and reactive to light.  Neck:     Thyroid: No thyromegaly.     Vascular: No JVD.  Cardiovascular:     Rate and  Rhythm: Normal rate and regular rhythm.     Heart sounds: Normal heart sounds. No murmur heard. No friction rub. No gallop.   Pulmonary:     Effort: Pulmonary effort is normal. No respiratory distress.     Breath sounds: Normal breath sounds. No wheezing or rales.  Chest:     Chest wall: No tenderness.  Abdominal:     General: Bowel sounds are normal. There is no distension.     Palpations: Abdomen is soft. There is no mass.     Tenderness: There is no abdominal tenderness. There is no guarding or rebound.  Musculoskeletal:        General: No tenderness or deformity. Normal range of motion.     Cervical back: Normal range of motion.  Lymphadenopathy:     Cervical: No cervical adenopathy.  Skin:    General: Skin is warm and dry.     Findings: No rash.  Neurological:     Mental Status: He is alert.     Cranial Nerves: No cranial nerve deficit.     Motor: No abnormal muscle tone.     Coordination: Coordination normal.     Gait: Gait normal.     Deep Tendon Reflexes: Reflexes are normal and symmetric.  Psychiatric:        Behavior: Behavior normal.        Thought Content: Thought content normal.    Very forgetful Stiff hips  Lab Results  Component Value Date   WBC 8.0 07/23/2020   HGB 14.8 07/23/2020   HCT 45.2 07/23/2020   PLT 218.0 07/23/2020   GLUCOSE 77 07/23/2020   CHOL 182 12/01/2017   TRIG 174 (H) 12/01/2017   HDL 48 12/01/2017   LDLDIRECT 157.5 07/05/2008   LDLCALC 99 12/01/2017   ALT 19 01/13/2019   AST 11 01/13/2019   NA 140 07/23/2020   K 3.4 (L) 07/23/2020   CL 104 07/23/2020   CREATININE 0.75 07/23/2020   BUN 26 (H) 07/23/2020   CO2 30 07/23/2020   TSH 1.17 05/23/2019   PSA 0.25 05/23/2019   INR 0.99 11/30/2017   HGBA1C 5.5 02/05/2018    DG Chest 2 View  Result Date: 04/23/2020 CLINICAL DATA:  Chest pain chest pain.  Shortness of breath EXAM: CHEST - 2 VIEW COMPARISON:  12/02/2017 CT.  Chest radiograph 11/30/2017 FINDINGS: Midline trachea.  Normal heart size. Atherosclerosis in the transverse aorta. Moderate left hemidiaphragm elevation is similar. No pleural effusion or pneumothorax. Clear lungs. Left shoulder arthroplasty. Remote left rib fractures. IMPRESSION: No acute cardiopulmonary disease. Chronic left hemidiaphragm elevation. Electronically Signed   By: Abigail Miyamoto M.D.   On: 04/23/2020 12:00   DG Knee Complete 4 Views Left  Result Date: 04/23/2020 CLINICAL DATA:  Left knee  pain and swelling.  No recent injury. EXAM: LEFT KNEE - COMPLETE 4+ VIEW COMPARISON:  None. FINDINGS: Total knee replacement in satisfactory position and alignment. No loosening. Negative for fracture. Mild joint effusion. Mild arterial calcification. IMPRESSION: Small left effusion. Negative for fracture. Knee prosthesis in good position without loosening. Electronically Signed   By: Franchot Gallo M.D.   On: 04/23/2020 12:00   VAS [place] LOWER EXTREMITY VENOUS (DVT) (ONLY [facility])  Result Date: 04/23/2020  Lower Venous DVTStudy Indications: Swelling.  Risk Factors: None identified. Limitations: Poor ultrasound/tissue interface. Comparison Study: No prior studies. Performing Technologist: Oliver Hum RVT  Examination Guidelines: A complete evaluation includes B-mode imaging, spectral Doppler, color Doppler, and power Doppler as needed of all accessible portions of each vessel. Bilateral testing is considered an integral part of a complete examination. Limited examinations for reoccurring indications may be performed as noted. The reflux portion of the exam is performed with the patient in reverse Trendelenburg.  +---------+---------------+---------+-----------+----------+--------------+ RIGHT    CompressibilityPhasicitySpontaneityPropertiesThrombus Aging +---------+---------------+---------+-----------+----------+--------------+ CFV      Full           Yes      Yes                                  +---------+---------------+---------+-----------+----------+--------------+ SFJ      Full                                                        +---------+---------------+---------+-----------+----------+--------------+ FV Prox  Full                                                        +---------+---------------+---------+-----------+----------+--------------+ FV Mid   Full                                                        +---------+---------------+---------+-----------+----------+--------------+ FV DistalFull                                                        +---------+---------------+---------+-----------+----------+--------------+ PFV      Full                                                        +---------+---------------+---------+-----------+----------+--------------+ POP      Full           Yes      Yes                                 +---------+---------------+---------+-----------+----------+--------------+ PTV  Full                                                        +---------+---------------+---------+-----------+----------+--------------+ PERO     Full                                                        +---------+---------------+---------+-----------+----------+--------------+   +---------+---------------+---------+-----------+----------+--------------+ LEFT     CompressibilityPhasicitySpontaneityPropertiesThrombus Aging +---------+---------------+---------+-----------+----------+--------------+ CFV      Full           Yes      Yes                                 +---------+---------------+---------+-----------+----------+--------------+ SFJ      Full                                                        +---------+---------------+---------+-----------+----------+--------------+ FV Prox  Full                                                         +---------+---------------+---------+-----------+----------+--------------+ FV Mid   Full                                                        +---------+---------------+---------+-----------+----------+--------------+ FV DistalFull                                                        +---------+---------------+---------+-----------+----------+--------------+ PFV      Full                                                        +---------+---------------+---------+-----------+----------+--------------+ POP      Full           Yes      Yes                                 +---------+---------------+---------+-----------+----------+--------------+ PTV      Full                                                        +---------+---------------+---------+-----------+----------+--------------+  PERO     Full                                                        +---------+---------------+---------+-----------+----------+--------------+     Summary: RIGHT: - There is no evidence of deep vein thrombosis in the lower extremity.  - No cystic structure found in the popliteal fossa.  LEFT: - There is no evidence of deep vein thrombosis in the lower extremity.  - No cystic structure found in the popliteal fossa.  *See table(s) above for measurements and observations. Electronically signed by Ruta Hinds MD on 04/23/2020 at 4:18:48 PM.    Final     Assessment & Plan:   There are no diagnoses linked to this encounter.   No orders of the defined types were placed in this encounter.    Follow-up: No follow-ups on file.  [name], MD

## 2020-08-01 NOTE — Assessment & Plan Note (Signed)
Predn 10 qod and 15 qod

## 2020-08-01 NOTE — Assessment & Plan Note (Signed)
On B12 

## 2020-08-01 NOTE — Assessment & Plan Note (Signed)
Wt Readings from Last 3 Encounters:  08/01/20 234 lb 6.4 oz (106.3 kg)  04/23/20 242 lb (109.8 kg)  04/09/20 235 lb (106.6 kg)

## 2020-08-01 NOTE — Patient Instructions (Signed)
   B-complex with Niacin 100 mg    Lion's mane  

## 2020-08-01 NOTE — Assessment & Plan Note (Signed)
Try Lion's mane 

## 2020-08-05 ENCOUNTER — Other Ambulatory Visit: Payer: Self-pay | Admitting: Internal Medicine

## 2020-08-09 ENCOUNTER — Ambulatory Visit: Payer: Medicare Other | Admitting: Internal Medicine

## 2020-08-13 ENCOUNTER — Encounter: Payer: Self-pay | Admitting: Adult Health

## 2020-08-13 ENCOUNTER — Other Ambulatory Visit: Payer: Self-pay

## 2020-08-13 ENCOUNTER — Ambulatory Visit: Payer: Medicare Other | Admitting: Adult Health

## 2020-08-13 VITALS — BP 134/74 | HR 56 | Ht 72.0 in | Wt 235.0 lb

## 2020-08-13 DIAGNOSIS — Z9989 Dependence on other enabling machines and devices: Secondary | ICD-10-CM

## 2020-08-13 DIAGNOSIS — G4733 Obstructive sleep apnea (adult) (pediatric): Secondary | ICD-10-CM

## 2020-08-13 NOTE — Progress Notes (Signed)
PATIENT: [name] DOB: Sep 30, 1940  REASON FOR VISIT: follow up HISTORY FROM: patient  HISTORY OF PRESENT ILLNESS: Today 08/13/20:  [name] is a 79 year old male with a history of obstructive sleep apnea on CPAP.  His download indicates that he uses machine 23 out of 30 days for compliance of 77%.  He uses machine greater than 4 hours 16 days for compliance of 53%.  On average he uses his machine 4 hours and 56 minutes.  His residual AHI is 9.8 on 7 to 14 cm of water with EPR 3.  Leak in the 95th percentile is 51.1 L/min.  Maximum pressure is 11.8.  He is with his wife today.  She reports that she does hear the mask leaking during the night.  He returns today for follow-up.  08/11/19: [name] is a 79 year old male with a history of obstructive sleep apnea on CPAP.  His download indicates that he use his machine 24 out of 30 days for compliance of 80%.  He uses machine greater than 4 hours 14 days for compliance of 47%.  On average he uses his machine 5 hours and 21 minutes.  His residual AHI is 6 on 7 to 14 cm of water with EPR 3.  His leak in the 95th percentile is 35.3 L/min.  He does need new supplies.  He reports that he does not like the tubing that comes out at the top of the head.  Patient reports that he still does not like using the CPAP but understands the benefit.  HISTORY 02/17/19:  [name] is a 79 year old male with a history of OSA on CPAP.  His CPAP download indicates that he uses machine 28 out of 30 days for compliance of 93%.  He uses machine greater than 4 hours 24 days for compliance of 80%.  On average he uses his machine 5 hours and 25 minutes.  His residual AHI is 5.6 on 7 to 13 cm of water with EPR of 3.  His leak in the 95th percentile is 20.9 L/min.  He reports that he has made a conscious effort to try to use the CPAP more often.  He is following with a [facility] neurologist for myasthenia gravis and memory disturbance.  He joins me today for  virtual visit.  REVIEW OF SYSTEMS: Out of a complete 14 system review of symptoms, the patient complains only of the following symptoms, and all other reviewed systems are negative. Epworth sleepiness score 5 Fatigue severity score 31  ALLERGIES: Allergies  Allergen Reactions  . Ferumoxytol Other (See Comments)    Stroke like symptoms; has hx of MG as well     HOME MEDICATIONS: Outpatient Medications Prior to Visit  Medication Sig Dispense Refill  . Cholecalciferol (VITAMIN D3) 2000 units TABS Take 2,000 Units by mouth at bedtime.    . Coenzyme Q10 (CO Q-10 PO) Take 1 tablet by mouth at bedtime.    . Cyanocobalamin (VITAMIN B-12) 1000 MCG SUBL Place 1 tablet (1,000 mcg total) under the tongue daily. (Patient taking differently: Place 1,000 mcg under the tongue at bedtime.) 100 tablet 3  . donepezil (ARICEPT) 10 MG tablet Take 1 tablet (10 mg total) by mouth at bedtime. 90 tablet 3  . famotidine (PEPCID) 40 MG tablet TAKE 1 TABLET BY MOUTH  DAILY 90 tablet 3  . finasteride (PROSCAR) 5 MG tablet Take 1 tablet (5 mg total) by mouth daily. 90 tablet 3  . furosemide (LASIX) 20  MG tablet TAKE 1 TO 2 TABLETS BY  MOUTH DAILY IN THE EVENING 180 tablet 3  . mycophenolate (CELLCEPT) 500 MG tablet Take 1,000 mg by mouth 2 (two) times daily.    . potassium chloride (KLOR-CON) 10 MEQ tablet TAKE 1 TABLET (10 MEQ TOTAL) BY MOUTH 2 (TWO) TIMES DAILY. 180 tablet 1  . predniSONE (DELTASONE) 10 MG tablet 25 mg qod 20 mg qod (Patient taking differently: Take 15 mg by mouth daily. 15 mg po once a day (rx from [facility])) 100 tablet 3  . pyridostigmine (MESTINON) 60 MG tablet One in AM , one at lunch, 2 at bedtime. (Patient taking differently: one at lunch) 450 tablet 3  . sertraline (ZOLOFT) 100 MG tablet Take 1 tablet (100 mg total) by mouth daily. 90 tablet 3  . tamsulosin (FLOMAX) 0.4 MG CAPS capsule TAKE 2 CAPSULES BY MOUTH  DAILY 180 capsule 3   No facility-administered medications prior to visit.     PAST MEDICAL HISTORY: Past Medical History:  Diagnosis Date  . Anemia   . Blood transfusion without reported diagnosis 02/23/2014   3 units for iron deficiency anemia. as a baby - whenhe had pneumonia  . BPH (benign prostatic hyperplasia)   . CTS (carpal tunnel syndrome)    better  . Dyspnea   . GERD (gastroesophageal reflux disease)   . Hyperlipidemia   . Hypertension    "THEY DIAGNOSED ME YEARS AGO BUT LATELY IVE ACTUALLY HAD LOW BLOOD PRESSURES "   . Insomnia   . LBP (low back pain)   . Osteoarthritis   . Pneumonia    as a baby  . Sleep apnea    no cpap    PAST SURGICAL HISTORY: Past Surgical History:  Procedure Laterality Date  . CARDIAC CATHETERIZATION N/A 08/15/2015   Procedure: Left Heart Cath and Coronary Angiography;  Surgeon: Lorretta Harp, MD;  Location: [place] [facility];  Service: Cardiovascular;  Laterality: N/A;  . CARPAL TUNNEL RELEASE Bilateral 1996, 2004   Dr Lenoard Aden thumb surgery.  more than once  . COLONOSCOPY  2005, 2012   diverticulosis.   Marland Kitchen ESOPHAGOGASTRODUODENOSCOPY N/A 02/23/2014   Procedure: ESOPHAGOGASTRODUODENOSCOPY (EGD);  Surgeon: Irene Shipper, MD;  Location: [facility];  Service: Endoscopy;  Laterality: N/A;  . EYE SURGERY  2012   both cataracts, Lasik  . LEFT KNEE ARTHROSCOPY Left 07/16/2017    [facility]   . LUMBAR FUSION     x 3  . REVERSE SHOULDER ARTHROPLASTY Left 07/04/2016   Procedure: LEFT REVERSE SHOULDER ARTHROPLASTY;  Surgeon: Netta Cedars, MD;  Location: [place];  Service: Orthopedics;  Laterality: Left;  . TONSILLECTOMY  1946  . TOTAL KNEE ARTHROPLASTY  2003   Right  . TOTAL KNEE ARTHROPLASTY Left 09/25/2017   Procedure: LEFT TOTAL KNEE ARTHROPLASTY;  Surgeon: Sydnee Cabal, MD;  Location: [facility];  Service: Orthopedics;  Laterality: Left;    FAMILY HISTORY: Family History  Problem Relation Age of Onset  . Aneurysm Father        AAA  . Heart disease Father 29       CHF  . Alzheimer's disease Father   . Cancer  Brother   . Alzheimer's disease Brother        dementia  . Stomach cancer Brother   . Cancer Mother 81       breast cancer  . Heart disease Mother 63       MI  . Autoimmune disease Son   .  CAD Brother   . Colon cancer Neg Hx   . Esophageal cancer Neg Hx   . Pancreatic cancer Neg Hx   . Prostate cancer Neg Hx   . Rectal cancer Neg Hx     SOCIAL HISTORY: Social History   Socioeconomic History  . Marital status: Married    Spouse name: Not on file  . Number of children: Not on file  . Years of education: Not on file  . Highest education level: Not on file  Occupational History  . Occupation: Retired    Fish farm manager: RETIRED    CommentDatabase administrator  Tobacco Use  . Smoking status: Former Smoker    Quit date: 01/08/1987    Years since quitting: 33.6  . Smokeless tobacco: Never Used  Vaping Use  . Vaping Use: Never used  Substance and Sexual Activity  . Alcohol use: Yes    Alcohol/week: 0.0 standard drinks    Comment: rare  . Drug use: No  . Sexual activity: Not Currently  Other Topics Concern  . Not on file  Social History Narrative   He lives with wife in a 2 story home.  Has 2 children.   Retired Dealer.   Highest level of education:  High school   Regular Exercise -  YES   Social Determinants of Health   Financial Resource Strain: Not on file  Food Insecurity: Not on file  Transportation Needs: Not on file  Physical Activity: Not on file  Stress: Not on file  Social Connections: Not on file  Intimate Partner Violence: Not on file      PHYSICAL EXAM  Vitals:   08/13/20 1412  BP: 134/74  Pulse: (!) 56  Weight: 235 lb (106.6 kg)  Height: 6' (1.829 m)   Body mass index is 31.87 kg/m.  Generalized: Well developed, in no acute distress  Chest: Lungs clear to auscultation bilaterally  Neurological examination  Mentation: Alert oriented to time, place, history taking. Follows all commands speech and language fluent Cranial nerve II-XII: Extraocular  movements were full, visual field were full on confrontational test Head turning and shoulder shrug  were normal and symmetric. Motor: The motor testing reveals 5 over 5 strength of all 4 extremities. Good symmetric motor tone is noted throughout.  Sensory: Sensory testing is intact to soft touch on all 4 extremities. No evidence of extinction is noted.  Gait and station: Gait is normal.    DIAGNOSTIC DATA (LABS, IMAGING, TESTING) - I reviewed patient records, labs, notes, testing and imaging myself where available.  Lab Results  Component Value Date   WBC 8.0 07/23/2020   HGB 14.8 07/23/2020   HCT 45.2 07/23/2020   MCV 89.0 07/23/2020   PLT 218.0 07/23/2020      Component Value Date/Time   NA 140 07/23/2020 1207   NA 144 02/05/2018 1205   NA 140 05/16/2014 1327   K 3.4 (L) 07/23/2020 1207   K 3.8 05/16/2014 1327   CL 104 07/23/2020 1207   CO2 30 07/23/2020 1207   CO2 28 05/16/2014 1327   GLUCOSE 77 07/23/2020 1207   GLUCOSE 98 05/16/2014 1327   BUN 26 (H) 07/23/2020 1207   BUN 11 02/05/2018 1205   BUN 17.6 05/16/2014 1327   CREATININE 0.75 07/23/2020 1207   CREATININE 0.82 08/13/2015 0953   CREATININE 0.9 05/16/2014 1327   CALCIUM 9.7 07/23/2020 1207   CALCIUM 9.4 05/16/2014 1327   PROT 6.1 01/13/2019 1117   PROT 6.0 02/05/2018 1205  PROT 6.9 05/16/2014 1327   ALBUMIN 4.0 01/13/2019 1117   ALBUMIN 4.4 02/05/2018 1205   ALBUMIN 3.7 05/16/2014 1327   AST 11 01/13/2019 1117   AST 19 05/16/2014 1327   ALT 19 01/13/2019 1117   ALT 9 05/16/2014 1327   ALKPHOS 41 01/13/2019 1117   ALKPHOS 66 05/16/2014 1327   BILITOT 0.5 01/13/2019 1117   BILITOT 0.4 02/05/2018 1205   BILITOT 0.50 05/16/2014 1327   GFRNONAA >60 04/23/2020 1135   GFRNONAA 87 08/13/2015 0953   GFRAA >60 04/23/2020 1135   GFRAA >89 08/13/2015 0953   Lab Results  Component Value Date   CHOL 182 12/01/2017   HDL 48 12/01/2017   LDLCALC 99 12/01/2017   LDLDIRECT 157.5 07/05/2008   TRIG 174 (H)  12/01/2017   CHOLHDL 3.8 12/01/2017   Lab Results  Component Value Date   HGBA1C 5.5 02/05/2018   Lab Results  Component Value Date   CHENIDPO24 235 05/23/2019   Lab Results  Component Value Date   TSH 1.17 05/23/2019      ASSESSMENT AND PLAN 79 y.o. year old male  has a past medical history of Anemia, Blood transfusion without reported diagnosis (02/23/2014), BPH (benign prostatic hyperplasia), CTS (carpal tunnel syndrome), Dyspnea, GERD (gastroesophageal reflux disease), Hyperlipidemia, Hypertension, Insomnia, LBP (low back pain), Osteoarthritis, Pneumonia, and Sleep apnea. here with:  1. Obstructive sleep apnea on CPAP  --Compliance is suboptimal --Residual AHI is elevated at 9.8 however the patient has a significant leak --We will request a mask refitting --Encouraged patient to use CPAP nightly and greater than 4 hours each night --Follow-up in 6 months or sooner if needed  I spent 30 minutes of face-to-face and non-face-to-face time with patient.  This included previsit chart review, lab review, study review, order entry, electronic health record documentation, patient education.    Ward Givens, MSN, NP-C 08/13/2020, 2:14 PM [facility] [address], [place], [place] [address] [phone]

## 2020-08-13 NOTE — Patient Instructions (Signed)
Your Plan:  Continue using CPAP nightly and greater than 4 hours each night Mask refitting ordered If your symptoms worsen or you develop new symptoms please let us know.    Thank you for coming to see us at [facility]. I hope we have been able to provide you high quality care today.  You may receive a patient satisfaction survey over the next few weeks. We would appreciate your feedback and comments so that we may continue to improve ourselves and the health of our patients.  

## 2020-08-14 NOTE — Progress Notes (Addendum)
CM sent to [facility] relating new order for cpap mask refitting.  Jacquelyne Balint, RN got it, thanks

## 2020-08-29 ENCOUNTER — Telehealth: Payer: Self-pay | Admitting: Internal Medicine

## 2020-08-29 NOTE — Telephone Encounter (Signed)
For a mild COVID-19 case you can take zinc 50 mg a day for 1 week, vitamin C 1000 mg daily for 1 week, vitamin D2 50,000 units weekly for 2 months, Quercetin 500 mg twice a day for 1 week.  Maintain good oral hydration and take Tylenol with high fever. If you get short of breath or sick call [place] COVID-19 hotline at [phone] [facility]). Appointments required. They have no walk-in appointments.  Thx

## 2020-08-29 NOTE — Telephone Encounter (Signed)
Patient diagnosed with covid yesterday, patient is experiencing fevers, cough, body aches, congestion. Patient wondering if he needs any monoclonal antibodies or what they should do because of his existing conditions.  [phone]

## 2020-08-30 NOTE — Telephone Encounter (Signed)
Called pt spoke w/wife gave MD recommendations.Marland KitchenRaechel Chute

## 2020-08-30 NOTE — Telephone Encounter (Signed)
Called pt there was no answer LMOM RTC...lmb 

## 2020-08-30 NOTE — Telephone Encounter (Signed)
Patient returning call. Please call back at [phone]

## 2020-09-04 ENCOUNTER — Emergency Department (HOSPITAL_COMMUNITY)
Admission: EM | Admit: 2020-09-04 | Discharge: 2020-09-04 | Disposition: A | Discharge status: Home / Self Care | Payer: Medicare Other | Attending: Emergency Medicine | Admitting: Emergency Medicine

## 2020-09-04 ENCOUNTER — Emergency Department (HOSPITAL_COMMUNITY): Payer: Medicare Other

## 2020-09-04 ENCOUNTER — Encounter (HOSPITAL_COMMUNITY): Payer: Self-pay | Admitting: Emergency Medicine

## 2020-09-04 ENCOUNTER — Other Ambulatory Visit: Payer: Self-pay

## 2020-09-04 DIAGNOSIS — Z96652 Presence of left artificial knee joint: Secondary | ICD-10-CM | POA: Insufficient documentation

## 2020-09-04 DIAGNOSIS — I10 Essential (primary) hypertension: Secondary | ICD-10-CM | POA: Diagnosis not present

## 2020-09-04 DIAGNOSIS — R079 Chest pain, unspecified: Secondary | ICD-10-CM | POA: Diagnosis not present

## 2020-09-04 DIAGNOSIS — R0781 Pleurodynia: Secondary | ICD-10-CM

## 2020-09-04 DIAGNOSIS — R059 Cough, unspecified: Secondary | ICD-10-CM | POA: Diagnosis not present

## 2020-09-04 DIAGNOSIS — J9811 Atelectasis: Secondary | ICD-10-CM | POA: Diagnosis not present

## 2020-09-04 DIAGNOSIS — R918 Other nonspecific abnormal finding of lung field: Secondary | ICD-10-CM | POA: Diagnosis not present

## 2020-09-04 DIAGNOSIS — U071 COVID-19: Secondary | ICD-10-CM | POA: Diagnosis not present

## 2020-09-04 DIAGNOSIS — Z87891 Personal history of nicotine dependence: Secondary | ICD-10-CM | POA: Diagnosis not present

## 2020-09-04 DIAGNOSIS — R0602 Shortness of breath: Secondary | ICD-10-CM | POA: Diagnosis not present

## 2020-09-04 DIAGNOSIS — R0789 Other chest pain: Secondary | ICD-10-CM | POA: Diagnosis not present

## 2020-09-04 DIAGNOSIS — R0902 Hypoxemia: Secondary | ICD-10-CM | POA: Diagnosis not present

## 2020-09-04 DIAGNOSIS — E860 Dehydration: Secondary | ICD-10-CM | POA: Diagnosis not present

## 2020-09-04 DIAGNOSIS — R5383 Other fatigue: Secondary | ICD-10-CM | POA: Diagnosis not present

## 2020-09-04 DIAGNOSIS — Z955 Presence of coronary angioplasty implant and graft: Secondary | ICD-10-CM | POA: Diagnosis not present

## 2020-09-04 LAB — COMPREHENSIVE METABOLIC PANEL
ALT: 22 U/L (ref 0–44)
AST: 29 U/L (ref 15–41)
Albumin: 3.3 g/dL — ABNORMAL LOW (ref 3.5–5.0)
Alkaline Phosphatase: 43 U/L (ref 38–126)
Anion gap: 9 (ref 5–15)
BUN: 20 mg/dL (ref 8–23)
CO2: 27 mmol/L (ref 22–32)
Calcium: 9.1 mg/dL (ref 8.9–10.3)
Chloride: 100 mmol/L (ref 98–111)
Creatinine, Ser: 0.74 mg/dL (ref 0.61–1.24)
GFR, Estimated: >60 mL/min (ref >60)
Glucose, Bld: 92 mg/dL (ref 70–99)
Potassium: 3.7 mmol/L (ref 3.5–5.1)
Sodium: 136 mmol/L (ref 135–145)
Total Bilirubin: 1.1 mg/dL (ref 0.3–1.2)
Total Protein: 6.1 g/dL — ABNORMAL LOW (ref 6.5–8.1)

## 2020-09-04 LAB — CBC WITH DIFFERENTIAL/PLATELET
Abs Immature Granulocytes: 0.04 10*3/uL (ref 0.00–0.07)
Basophils Absolute: 0 10*3/uL (ref 0.0–0.1)
Basophils Relative: 0 %
Eosinophils Absolute: 0 10*3/uL (ref 0.0–0.5)
Eosinophils Relative: 0 %
HCT: 40.5 % (ref 39.0–52.0)
Hemoglobin: 13 g/dL (ref 13.0–17.0)
Immature Granulocytes: 1 %
Lymphocytes Relative: 14 %
Lymphs Abs: 0.8 10*3/uL (ref 0.7–4.0)
MCH: 29.4 pg (ref 26.0–34.0)
MCHC: 32.1 g/dL (ref 30.0–36.0)
MCV: 91.6 fL (ref 80.0–100.0)
Monocytes Absolute: 0.7 10*3/uL (ref 0.1–1.0)
Monocytes Relative: 11 %
Neutro Abs: 4.5 10*3/uL (ref 1.7–7.7)
Neutrophils Relative %: 74 %
Platelets: 183 10*3/uL (ref 150–400)
RBC: 4.42 MIL/uL (ref 4.22–5.81)
RDW: 14.4 % (ref 11.5–15.5)
WBC: 6.1 10*3/uL (ref 4.0–10.5)
nRBC: 0 % (ref 0.0–0.2)

## 2020-09-04 LAB — RESP PANEL BY RT-PCR (FLU A&B, COVID) ARPGX2
Influenza A by PCR: NEGATIVE
Influenza B by PCR: NEGATIVE
SARS Coronavirus 2 by RT PCR: POSITIVE — AB

## 2020-09-04 LAB — TROPONIN I (HIGH SENSITIVITY)
Troponin I (High Sensitivity): 7 ng/L (ref <18)
Troponin I (High Sensitivity): 6 ng/L (ref <18)

## 2020-09-04 LAB — D-DIMER, QUANTITATIVE: D-Dimer, Quant: 0.85 ug/mL-FEU — ABNORMAL HIGH (ref 0.00–0.50)

## 2020-09-04 MED ORDER — IOHEXOL 350 MG/ML SOLN
100.0000 mL | Freq: Once | INTRAVENOUS | Status: AC | PRN
  Administered 2020-09-04: 100 mL via INTRAVENOUS

## 2020-09-04 NOTE — Discharge Instructions (Addendum)
You were seen in the emergency department today with chest pain.  Your COVID symptoms have progressed to a mild pneumonia seen on CT scan.  Did not have blood clots in your lungs.  Please manage her symptoms at home as you have been doing.  If you develop worsening shortness of breath, return of chest pain, fatigue, confusion please return to the emergency department immediately and/or call 911.

## 2020-09-04 NOTE — ED Notes (Signed)
o2 is at 93 while sitting at the edge of the bed. 91 while ambulating back and fourth to door and bed

## 2020-09-04 NOTE — ED Triage Notes (Signed)
Arrives via EMS from home, tested COVID + on 08/28/20. Hx of MG, patient reports SOB and fatigue at baseline. Reports non-productive cough x1 week, wife is concerned about decreased intake.  This morning patient stated he woke up with chest tightness/sharp pain when he takes a deep breath. Ow w/ EMS was 90-92% RA, EMS placed on 2 L [place] and was about 95%.

## 2020-09-04 NOTE — ED Provider Notes (Addendum)
Emergency Department Provider Note   I have reviewed the triage vital signs and the nursing notes.   HISTORY  Chief Complaint Cough and Covid Positive   HPI [name] is a 80 y.o. male with past medical history reviewed below presents to the emergency department with chest pain starting this morning in the setting of recent COVID-19 diagnosis.  Patient reportedly tested positive on January 4 at an unknown location.  He has had some productive cough and fatigue but few other symptoms.  He developed mostly sharp, central chest discomfort this morning which was worse with deep breathing.  He states his pain at this time is gone he is not feeling short of breath.  EMS arrived to find the patient satting around 90% on room air.  They placed him on 2 L nasal cannula with improvement to 95%.  Patient denies any abdominal pain or vomiting.  No diarrhea.  No radiation of symptoms or other modifying factors.  Past Medical History:  Diagnosis Date  . Anemia   . Blood transfusion without reported diagnosis 02/23/2014   3 units for iron deficiency anemia. as a baby - whenhe had pneumonia  . BPH (benign prostatic hyperplasia)   . CTS (carpal tunnel syndrome)    better  . Dyspnea   . GERD (gastroesophageal reflux disease)   . Hyperlipidemia   . Hypertension    "THEY DIAGNOSED ME YEARS AGO BUT LATELY IVE ACTUALLY HAD LOW BLOOD PRESSURES "   . Insomnia   . LBP (low back pain)   . Osteoarthritis   . Pneumonia    as a baby  . Sleep apnea    no cpap    Patient Active Problem List   Diagnosis Date Noted  . Alzheimer disease ([place]) 11/29/2019  . Depressed mood 05/23/2019  . GERD (gastroesophageal reflux disease) 09/09/2018  . Chronic coughing 06/07/2018  . Hypokalemia 04/16/2018  . History of plasmapheresis 04/12/2018  . MCI (mild cognitive impairment) 04/12/2018  . Myasthenia exacerbation ([place]) 04/12/2018  . Weakness of both lower extremities 01/13/2018  . Myasthenia gravis,  AChR antibody positive ([place]) 01/13/2018  . Dysarthria 12/04/2017  . Dysphagia   . CVA (cerebral vascular accident) ([place]) 11/30/2017  . Angio-edema 11/26/2017  . S/P knee replacement 09/25/2017  . Knee pain, chronic 08/14/2017  . OSA on CPAP 03/05/2017  . OSA (obstructive sleep apnea) 09/30/2016  . S/P shoulder replacement, left 07/04/2016  . Preop exam for internal medicine 04/16/2016  . Claudication ([place]) 09/14/2015  . Pain in the chest   . Abnormal stress test 08/14/2015  . Dyspnea 07/23/2015  . Paresthesia 10/25/2014  . Weakness 04/13/2014  . Heme positive stool 02/22/2014  . Benign prostatic hyperplasia 02/22/2014  . B12 deficiency 02/21/2014  . Anemia, iron deficiency 02/21/2014  . Leg weakness, bilateral 01/24/2014  . Erectile dysfunction 01/24/2014  . Edema 01/24/2014  . URI, acute 08/31/2013  . Cellulitis of arm, right 07/13/2013  . Grief 07/13/2013  . Well adult exam 03/01/2012  . CARPAL TUNNEL SYNDROME 11/04/2010  . BENIGN PROSTATIC HYPERTROPHY, WITH URINARY OBSTRUCTION 11/04/2010  . WRIST PAIN 11/04/2010  . LEG PAIN 07/16/2010  . COUGH 05/20/2010  . TOBACCO USE, QUIT 07/04/2009  . WARTS, VIRAL, UNSPECIFIED 11/06/2008  . INSOMNIA, PERSISTENT 01/14/2008  . Essential hypertension 01/14/2008  . RHINITIS 01/14/2008  . ABDOMINAL PAIN 01/14/2008  . TREMOR 09/07/2007  . Actinic keratosis 08/12/2007  . Neoplasm of uncertain behavior of skin 07/06/2007  . LOW BACK PAIN 07/06/2007  . Dyslipidemia  06/05/2007  . Coronary atherosclerosis 06/05/2007  . Osteoarthritis 06/05/2007    Past Surgical History:  Procedure Laterality Date  . CARDIAC CATHETERIZATION N/A 08/15/2015   Procedure: Left Heart Cath and Coronary Angiography;  Surgeon: Lorretta Harp, MD;  Location: [facility];  Service: Cardiovascular;  Laterality: N/A;  . CARPAL TUNNEL RELEASE Bilateral 1996, 2004   Dr Lenoard Aden thumb surgery.  more than once  . COLONOSCOPY  2005, 2012   diverticulosis.    Marland Kitchen ESOPHAGOGASTRODUODENOSCOPY N/A 02/23/2014   Procedure: ESOPHAGOGASTRODUODENOSCOPY (EGD);  Surgeon: Irene Shipper, MD;  Location: [facility];  Service: Endoscopy;  Laterality: N/A;  . EYE SURGERY  2012   both cataracts, Lasik  . LEFT KNEE ARTHROSCOPY Left 07/16/2017    [facility]   . LUMBAR FUSION     x 3  . REVERSE SHOULDER ARTHROPLASTY Left 07/04/2016   Procedure: LEFT REVERSE SHOULDER ARTHROPLASTY;  Surgeon: Netta Cedars, MD;  Location: [facility];  Service: Orthopedics;  Laterality: Left;  . TONSILLECTOMY  1946  . TOTAL KNEE ARTHROPLASTY  2003   Right  . TOTAL KNEE ARTHROPLASTY Left 09/25/2017   Procedure: LEFT TOTAL KNEE ARTHROPLASTY;  Surgeon: Sydnee Cabal, MD;  Location: [facility];  Service: Orthopedics;  Laterality: Left;    Allergies Ferumoxytol  Family History  Problem Relation Age of Onset  . Aneurysm Father        AAA  . Heart disease Father 21       CHF  . Alzheimer's disease Father   . Cancer Brother   . Alzheimer's disease Brother        dementia  . Stomach cancer Brother   . Cancer Mother 36       breast cancer  . Heart disease Mother 71       MI  . Autoimmune disease Son   . CAD Brother   . Colon cancer Neg Hx   . Esophageal cancer Neg Hx   . Pancreatic cancer Neg Hx   . Prostate cancer Neg Hx   . Rectal cancer Neg Hx     Social History Social History   Tobacco Use  . Smoking status: Former Smoker    Quit date: 01/08/1987    Years since quitting: 33.6  . Smokeless tobacco: Never Used  Vaping Use  . Vaping Use: Never used  Substance Use Topics  . Alcohol use: Yes    Alcohol/week: 0.0 standard drinks    Comment: rare  . Drug use: No    Review of Systems  Constitutional: No fever/chills. Positive fatigue.  Eyes: No visual changes. ENT: No sore throat. Cardiovascular: Positive chest pain. Respiratory: Positive mild shortness of breath.  Gastrointestinal: No abdominal pain.  No nausea, no vomiting.  No diarrhea.  No constipation. Genitourinary:  Negative for dysuria. Musculoskeletal: Negative for back pain. Skin: Negative for rash. Neurological: Negative for headaches, focal weakness or numbness.  10-point ROS otherwise negative.  ____________________________________________   PHYSICAL EXAM:  VITAL SIGNS: ED Triage Vitals  Enc Vitals Group     BP 09/04/20 0901 (!) 141/66     Pulse Rate 09/04/20 0901 72     Resp 09/04/20 0901 20     Temp 09/04/20 0901 100 F (37.8 C)     Temp Source 09/04/20 0901 Oral     SpO2 09/04/20 0901 95 %     Weight 09/04/20 0908 235 lb 14.3 oz (107 kg)     Height 09/04/20 0908 6' (1.829 m)   Constitutional: Alert and  oriented. Well appearing and in no acute distress. Eyes: Conjunctivae are normal.  Head: Atraumatic. Nose: Mild congestion/rhinnorhea. Mouth/Throat: Mucous membranes are moist. Neck: No stridor.   Cardiovascular: Normal rate, regular rhythm. Good peripheral circulation. Grossly normal heart sounds.   Respiratory: Normal respiratory effort.  No retractions. Lungs CTAB. Gastrointestinal: Soft and nontender. No distention.  Musculoskeletal: No lower extremity tenderness nor edema. No gross deformities of extremities. Neurologic:  Normal speech and language. No gross focal neurologic deficits are appreciated.  Skin:  Skin is warm, dry and intact. No rash noted.   ____________________________________________   LABS (all labs ordered are listed, but only abnormal results are displayed)  Labs Reviewed  COMPREHENSIVE METABOLIC PANEL - Abnormal; Notable for the following components:      Result Value   Total Protein 6.1 (*)    Albumin 3.3 (*)    All other components within normal limits  D-DIMER, QUANTITATIVE (NOT AT [facility]) - Abnormal; Notable for the following components:   D-Dimer, Quant 0.85 (*)    All other components within normal limits  RESP PANEL BY RT-PCR (FLU A&B, COVID) [id number]  CBC WITH DIFFERENTIAL/PLATELET  TROPONIN I (HIGH SENSITIVITY)  TROPONIN I (HIGH  SENSITIVITY)   ____________________________________________  EKG   EKG Interpretation  Date/Time:  Tuesday September 04 2020 09:47:42 EST Ventricular Rate:  66 PR Interval:    QRS Duration: 102 QT Interval:  401 QTC Calculation: 421 R Axis:   56 Text Interpretation: sinus rhythm Posterior infarct, old no stemi Confirmed by Nanda Quinton [phone]) on 09/04/2020 10:00:58 AM       ____________________________________________  RADIOLOGY  DG Chest Portable 1 View  Result Date: 09/04/2020 CLINICAL DATA:  Shortness of breath, fatigue and cough for 1 week. COVID positive. EXAM: PORTABLE CHEST 1 VIEW COMPARISON:  Chest x-ray 04/23/2020 FINDINGS: The heart is within normal limits in size given the AP projection portable technique. There is moderate tortuosity and calcification of thoracic aorta. Bilateral pulmonary infiltrates, left greater than right. Stable moderate eventration of the left hemidiaphragm. No pneumothorax or large pleural effusion. Remote healed left rib fractures are noted. Reversed total left shoulder arthroplasty. IMPRESSION: Bilateral pulmonary infiltrates, left worse than right. Electronically Signed   By: Marijo Sanes M.D.   On: 09/04/2020 09:46    ____________________________________________   PROCEDURES  Procedure(s) performed:   Procedures  None  ____________________________________________   INITIAL IMPRESSION / ASSESSMENT AND PLAN / ED COURSE  Pertinent labs & imaging results that were available during my care of the patient were reviewed by me and considered in my medical decision making (see chart for details).   Patient presents to the ED with COVID-19 and sharp, pleuritic chest pain this morning.  No active pain symptoms at this time.  Patient is borderline hypoxemic but not currently requiring oxygen as this was discontinued on arrival.  His saturations are in the low 90s.  Plan to ambulate on pulse ox and reassess.  Considering PE will send a D-dimer  and obtain chest x-ray along with troponins although pain is not typical of ACS.   12:05 PM  Called to patient bedside.  He states he is getting increasingly frustrated and wants to leave.  He does not have a specific complaint but tells me he is ready to go.  We discussed his lab work including a elevated D-dimer.  He is at increased risk for PE given his reported diagnosis of COVID and then having pleuritic chest pain this morning.  We discussed this in detail but  he refused his CT scan when they came to get him.  He is awake and alert.  He is conversational.  He is able to explain back to me my concern but would like to be discharged.  We specifically discussed the risk of death and disability but he remains resolute in his plans for discharge.  Nursing staff have called his wife by phone but patient is ready to leave AMA.  By my assessment he has capacity to make this decision at this time.  Made clear that he can and should return if his symptoms worsen or if he changes his mind about further testing.   Ultimately, the patient's wife was able to come back to the room and help to convince the patient to stay for the CT scans.  The CTA of the chest shows no PE.  He does have some developing COVID-pneumonia on CT.  He is not hypoxemic here.  He is relatively asymptomatic other than cough.  He will continue his home medications.  Patient has myasthenia gravis and is already on prednisone.  Plan to continue this.  He is outside the window to benefit from monoclonal antibodies, unfortunately.  Advised that he follow closely with his PCP by telehealth and return with any new or suddenly worsening symptoms.  Lorraine Terriquez Rheaume was evaluated in Emergency Department on 09/04/2020 for the symptoms described in the history of present illness. He was evaluated in the context of the global COVID-19 pandemic, which necessitated consideration that the patient might be at risk for infection with the SARS-CoV-2 virus that  causes COVID-19. Institutional protocols and algorithms that pertain to the evaluation of patients at risk for COVID-19 are in a state of rapid change based on information released by regulatory bodies including the CDC and federal and state organizations. These policies and algorithms were followed during the patient's care in the ED.  ____________________________________________  FINAL CLINICAL IMPRESSION(S) / ED DIAGNOSES  Final diagnoses:  Pleuritic chest pain  COVID-19    MEDICATIONS GIVEN DURING THIS VISIT:  Medications  iohexol (OMNIPAQUE) 350 MG/ML injection 100 mL (has no administration in time range)    Note:  This document was prepared using Dragon voice recognition software and may include unintentional dictation errors.  Nanda Quinton, MD, [facility] Emergency Medicine    Long, Wonda Old[place], MD 09/04/20 1209    Margette Fast, MD 09/04/20 1415

## 2020-09-05 ENCOUNTER — Telehealth: Payer: Self-pay

## 2020-09-05 NOTE — Telephone Encounter (Signed)
Reviewed pt. Encounter 09/04/20. ED notes pt. is outside of window for infusion therapy for COVID 19.

## 2020-09-28 ENCOUNTER — Other Ambulatory Visit: Payer: Self-pay | Admitting: Internal Medicine

## 2020-09-28 DIAGNOSIS — E611 Iron deficiency: Secondary | ICD-10-CM | POA: Diagnosis not present

## 2020-09-28 DIAGNOSIS — Z5181 Encounter for therapeutic drug level monitoring: Secondary | ICD-10-CM | POA: Diagnosis not present

## 2020-09-28 DIAGNOSIS — G7000 Myasthenia gravis without (acute) exacerbation: Secondary | ICD-10-CM | POA: Diagnosis not present

## 2020-10-09 DIAGNOSIS — G4733 Obstructive sleep apnea (adult) (pediatric): Secondary | ICD-10-CM | POA: Diagnosis not present

## 2020-10-12 DIAGNOSIS — G301 Alzheimer's disease with late onset: Secondary | ICD-10-CM | POA: Diagnosis not present

## 2020-10-18 ENCOUNTER — Other Ambulatory Visit (INDEPENDENT_AMBULATORY_CARE_PROVIDER_SITE_OTHER): Payer: Medicare Other

## 2020-10-18 DIAGNOSIS — I10 Essential (primary) hypertension: Secondary | ICD-10-CM | POA: Diagnosis not present

## 2020-10-18 DIAGNOSIS — D509 Iron deficiency anemia, unspecified: Secondary | ICD-10-CM | POA: Diagnosis not present

## 2020-10-18 LAB — CBC WITH DIFFERENTIAL/PLATELET
Basophils Absolute: 0 10*3/uL (ref 0.0–0.1)
Basophils Relative: 0.6 % (ref 0.0–3.0)
Eosinophils Absolute: 0.1 10*3/uL (ref 0.0–0.7)
Eosinophils Relative: 1.4 % (ref 0.0–5.0)
HCT: 42.5 % (ref 39.0–52.0)
Hemoglobin: 14 g/dL (ref 13.0–17.0)
Lymphocytes Relative: 18.2 % (ref 12.0–46.0)
Lymphs Abs: 1.4 10*3/uL (ref 0.7–4.0)
MCHC: 32.9 g/dL (ref 30.0–36.0)
MCV: 90.9 fl (ref 78.0–100.0)
Monocytes Absolute: 0.8 10*3/uL (ref 0.1–1.0)
Monocytes Relative: 10.6 % (ref 3.0–12.0)
Neutro Abs: 5.4 10*3/uL (ref 1.4–7.7)
Neutrophils Relative %: 69.2 % (ref 43.0–77.0)
Platelets: 213 10*3/uL (ref 150.0–400.0)
RBC: 4.67 Mil/uL (ref 4.22–5.81)
RDW: 15.4 % (ref 11.5–15.5)
WBC: 7.7 10*3/uL (ref 4.0–10.5)

## 2020-10-18 LAB — BASIC METABOLIC PANEL
BUN: 22 mg/dL (ref 6–23)
CO2: 31 mEq/L (ref 19–32)
Calcium: 9.5 mg/dL (ref 8.4–10.5)
Chloride: 102 mEq/L (ref 96–112)
Creatinine, Ser: 0.82 mg/dL (ref 0.40–1.50)
GFR: 83.49 mL/min (ref >60.00)
Glucose, Bld: 85 mg/dL (ref 70–99)
Potassium: 3.6 mEq/L (ref 3.5–5.1)
Sodium: 139 mEq/L (ref 135–145)

## 2020-10-18 LAB — IBC PANEL
Iron: 114 ug/dL (ref 42–165)
Saturation Ratios: 32.6 % (ref 20.0–50.0)
Transferrin: 250 mg/dL (ref 212.0–360.0)

## 2020-10-19 ENCOUNTER — Other Ambulatory Visit: Payer: Self-pay | Admitting: Internal Medicine

## 2020-10-22 ENCOUNTER — Other Ambulatory Visit: Payer: Self-pay | Admitting: Internal Medicine

## 2020-10-23 ENCOUNTER — Other Ambulatory Visit: Payer: Self-pay | Admitting: Internal Medicine

## 2020-10-23 NOTE — Telephone Encounter (Signed)
Patients wife called and said they are needing a new prescription for donepezil (ARICEPT) 10 MG tablet. She said that it needs to be sent to [id number] - [place], [place]. Last OV 12.8.21, next OV 4.11.22. Please advise

## 2020-10-24 NOTE — Telephone Encounter (Signed)
Done../lmb

## 2020-11-13 ENCOUNTER — Other Ambulatory Visit: Payer: Self-pay | Admitting: Internal Medicine

## 2020-11-19 ENCOUNTER — Other Ambulatory Visit: Payer: Self-pay | Admitting: Internal Medicine

## 2020-11-21 ENCOUNTER — Other Ambulatory Visit: Payer: Self-pay | Admitting: Internal Medicine

## 2020-11-30 ENCOUNTER — Other Ambulatory Visit: Payer: Self-pay

## 2020-12-03 ENCOUNTER — Ambulatory Visit (INDEPENDENT_AMBULATORY_CARE_PROVIDER_SITE_OTHER): Payer: Medicare Other | Admitting: Internal Medicine

## 2020-12-03 ENCOUNTER — Encounter: Payer: Self-pay | Admitting: Internal Medicine

## 2020-12-03 ENCOUNTER — Other Ambulatory Visit: Payer: Self-pay

## 2020-12-03 VITALS — BP 140/78 | HR 60 | Temp 98.2°F | Ht 72.0 in | Wt 233.8 lb

## 2020-12-03 DIAGNOSIS — I700 Atherosclerosis of aorta: Secondary | ICD-10-CM

## 2020-12-03 DIAGNOSIS — E876 Hypokalemia: Secondary | ICD-10-CM | POA: Diagnosis not present

## 2020-12-03 DIAGNOSIS — M5440 Lumbago with sciatica, unspecified side: Secondary | ICD-10-CM

## 2020-12-03 DIAGNOSIS — E785 Hyperlipidemia, unspecified: Secondary | ICD-10-CM | POA: Diagnosis not present

## 2020-12-03 DIAGNOSIS — I2510 Atherosclerotic heart disease of native coronary artery without angina pectoris: Secondary | ICD-10-CM | POA: Diagnosis not present

## 2020-12-03 DIAGNOSIS — R531 Weakness: Secondary | ICD-10-CM | POA: Diagnosis not present

## 2020-12-03 DIAGNOSIS — N320 Bladder-neck obstruction: Secondary | ICD-10-CM | POA: Diagnosis not present

## 2020-12-03 DIAGNOSIS — E538 Deficiency of other specified B group vitamins: Secondary | ICD-10-CM | POA: Diagnosis not present

## 2020-12-03 DIAGNOSIS — I639 Cerebral infarction, unspecified: Secondary | ICD-10-CM | POA: Diagnosis not present

## 2020-12-03 MED ORDER — POTASSIUM CHLORIDE ER 10 MEQ PO TBCR: 10.0000 meq | EXTENDED_RELEASE_TABLET | Freq: Two times a day (BID) | ORAL | 3 refills | Status: DC

## 2020-12-03 NOTE — Assessment & Plan Note (Addendum)
No angina. Cont w/Simvastatin

## 2020-12-03 NOTE — Assessment & Plan Note (Signed)
MG - cont w/Mestinon

## 2020-12-03 NOTE — Assessment & Plan Note (Signed)
Try Lion's mane 

## 2020-12-03 NOTE — Assessment & Plan Note (Signed)
Cont w/KCl 10 meq bid

## 2020-12-03 NOTE — Assessment & Plan Note (Signed)
Tramadol prn ° Potential benefits of a long term opioids use as well as potential risks (i.e. addiction risk, apnea etc) and complications (i.e. Somnolence, constipation and others) were explained to the patient and were aknowledged. ° ° °

## 2020-12-03 NOTE — Progress Notes (Signed)
Subjective:  Patient ID: [name], male    DOB: Feb 11, 1941  Age: 80 y.o. MRN: [id number]  CC: Follow-up (4 month f/u)   HPI [name] presents for MG, LBP, dementia, anemia C/o DOE at times  Outpatient Medications Prior to Visit  Medication Sig Dispense Refill  . Cholecalciferol (VITAMIN D3) 2000 units TABS Take 2,000 Units by mouth at bedtime.    . Coenzyme Q10 (CO Q-10 PO) Take 1 tablet by mouth at bedtime.    . Cyanocobalamin (VITAMIN B-12) 1000 MCG SUBL Place 1 tablet (1,000 mcg total) under the tongue daily. (Patient taking differently: Place 1,000 mcg under the tongue at bedtime.) 100 tablet 3  . donepezil (ARICEPT) 10 MG tablet TAKE 1 TABLET BY MOUTH EVERYDAY AT BEDTIME 90 tablet 2  . famotidine (PEPCID) 40 MG tablet TAKE 1 TABLET BY MOUTH EVERY DAY 90 tablet 3  . finasteride (PROSCAR) 5 MG tablet TAKE 1 TABLET BY MOUTH  DAILY 90 tablet 3  . furosemide (LASIX) 20 MG tablet TAKE 1 TO 2 TABLETS BY  MOUTH DAILY IN THE EVENING 180 tablet 3  . mycophenolate (CELLCEPT) 500 MG tablet Take 1,000 mg by mouth 2 (two) times daily.    . potassium citrate (UROCIT-K) 10 MEQ (1080 MG) SR tablet Take 1 tablet by mouth twice daily 60 tablet 5  . predniSONE (DELTASONE) 10 MG tablet 25 mg qod 20 mg qod (Patient taking differently: Take 15 mg by mouth daily. 15 mg po once a day (rx from [facility])) 100 tablet 3  . pyridostigmine (MESTINON) 60 MG tablet One in AM , one at lunch, 2 at bedtime. (Patient taking differently: One in AM , one in PM, and 1 at bedtime if he need) 450 tablet 3  . sertraline (ZOLOFT) 100 MG tablet TAKE 1 TABLET BY MOUTH EVERY DAY 90 tablet 3  . tamsulosin (FLOMAX) 0.4 MG CAPS capsule TAKE 2 CAPSULES BY MOUTH  DAILY 180 capsule 3  . potassium chloride (KLOR-CON) 10 MEQ tablet Take 1 tablet (10 mEq total) by mouth daily. Annual appt due in August must see provider for future refills (Patient not taking: Reported on 12/03/2020) 90 tablet 1   No  facility-administered medications prior to visit.    ROS: Review of Systems  Constitutional: Negative for appetite change, fatigue and unexpected weight change.  HENT: Negative for congestion, nosebleeds, sneezing, sore throat and trouble swallowing.   Eyes: Negative for itching and visual disturbance.  Respiratory: Negative for cough.   Cardiovascular: Negative for chest pain, palpitations and leg swelling.  Gastrointestinal: Negative for abdominal distention, blood in stool, diarrhea and nausea.  Genitourinary: Negative for frequency and hematuria.  Musculoskeletal: Positive for arthralgias, back pain and gait problem. Negative for joint swelling and neck pain.  Skin: Negative for rash.  Neurological: Positive for weakness. Negative for dizziness, tremors and speech difficulty.  Psychiatric/Behavioral: Negative for agitation, dysphoric mood, sleep disturbance and suicidal ideas. The patient is not nervous/anxious.     Objective:  BP 140/78 (BP Location: Left Arm)   Pulse 60   Temp 98.2 F (36.8 C) (Oral)   Ht 6' (1.829 m)   Wt 233 lb 12.8 oz (106.1 kg)   SpO2 94%   BMI 31.71 kg/m   BP Readings from Last 3 Encounters:  12/03/20 140/78  09/04/20 111/69  08/13/20 134/74    Wt Readings from Last 3 Encounters:  12/03/20 233 lb 12.8 oz (106.1 kg)  09/04/20 235 lb 14.3 oz (107 kg)  08/13/20 235 lb (106.6 kg)    Physical Exam Constitutional:      General: He is not in acute distress.    Appearance: He is well-developed.     Comments: NAD  Eyes:     Conjunctiva/sclera: Conjunctivae normal.     Pupils: Pupils are equal, round, and reactive to light.  Neck:     Thyroid: No thyromegaly.     Vascular: No JVD.  Cardiovascular:     Rate and Rhythm: Normal rate and regular rhythm.     Heart sounds: Normal heart sounds. No murmur heard. No friction rub. No gallop.   Pulmonary:     Effort: Pulmonary effort is normal. No respiratory distress.     Breath sounds: Normal breath  sounds. No wheezing or rales.  Chest:     Chest wall: No tenderness.  Abdominal:     General: Bowel sounds are normal. There is no distension.     Palpations: Abdomen is soft. There is no mass.     Tenderness: There is no abdominal tenderness. There is no guarding or rebound.  Musculoskeletal:        General: No tenderness. Normal range of motion.     Cervical back: Normal range of motion.  Lymphadenopathy:     Cervical: No cervical adenopathy.  Skin:    General: Skin is warm and dry.     Findings: No rash.  Neurological:     Mental Status: He is alert. He is disoriented.     Cranial Nerves: No cranial nerve deficit.     Motor: Weakness present. No abnormal muscle tone.     Coordination: Coordination normal.     Gait: Gait abnormal.     Deep Tendon Reflexes: Reflexes are normal and symmetric.  Psychiatric:        Mood and Affect: Mood normal.        Behavior: Behavior normal.     Lab Results  Component Value Date   WBC 7.7 10/18/2020   HGB 14.0 10/18/2020   HCT 42.5 10/18/2020   PLT 213.0 10/18/2020   GLUCOSE 85 10/18/2020   CHOL 182 12/01/2017   TRIG 174 (H) 12/01/2017   HDL 48 12/01/2017   LDLDIRECT 157.5 07/05/2008   LDLCALC 99 12/01/2017   ALT 22 09/04/2020   AST 29 09/04/2020   NA 139 10/18/2020   K 3.6 10/18/2020   CL 102 10/18/2020   CREATININE 0.82 10/18/2020   BUN 22 10/18/2020   CO2 31 10/18/2020   TSH 1.17 05/23/2019   PSA 0.25 05/23/2019   INR 0.99 11/30/2017   HGBA1C 5.5 02/05/2018    CT Angio Chest PE W and/or Wo Contrast  Result Date: 09/04/2020 CLINICAL DATA:  Productive cough. COVID 19 positive. Rule out pulmonary embolism. EXAM: CT ANGIOGRAPHY CHEST WITH CONTRAST TECHNIQUE: Multidetector CT imaging of the chest was performed using the standard protocol during bolus administration of intravenous contrast. Multiplanar CT image reconstructions and MIPs were obtained to evaluate the vascular anatomy. CONTRAST:  17mL OMNIPAQUE IOHEXOL 350 MG/ML  SOLN COMPARISON:  Chest 09/04/2020.  CT chest 12/02/2017 FINDINGS: Cardiovascular: Negative for pulmonary embolism. Pulmonary arteries normal in caliber Moderate coronary calcification in the left coronary artery. Mild atherosclerotic calcification aortic arch without aneurysm. Mediastinum/Nodes: Negative for mass or adenopathy. Lungs/Pleura: Moderate bilateral airspace disease left greater than right compatible with COVID pneumonia. Elevated left hemidiaphragm with moderate left lower lobe atelectasis, chronic. No pleural effusion. Upper Abdomen: No acute abnormality. Musculoskeletal: Degenerative changes and spurring in the thoracic  spine. No acute skeletal abnormality. Review of the MIP images confirms the above findings. IMPRESSION: 1. Negative for pulmonary embolism 2. Moderate coronary calcification. 3.  Aortic Atherosclerosis (ICD10-I70.0). 4. Moderate patchy bilateral airspace disease left greater than right compatible with COVID pneumonia. 5. Chronic elevation left hemidiaphragm. Electronically Signed   By: Franchot Gallo M.D.   On: 09/04/2020 13:46   DG Chest Portable 1 View  Result Date: 09/04/2020 CLINICAL DATA:  Shortness of breath, fatigue and cough for 1 week. COVID positive. EXAM: PORTABLE CHEST 1 VIEW COMPARISON:  Chest x-ray 04/23/2020 FINDINGS: The heart is within normal limits in size given the AP projection portable technique. There is moderate tortuosity and calcification of thoracic aorta. Bilateral pulmonary infiltrates, left greater than right. Stable moderate eventration of the left hemidiaphragm. No pneumothorax or large pleural effusion. Remote healed left rib fractures are noted. Reversed total left shoulder arthroplasty. IMPRESSION: Bilateral pulmonary infiltrates, left worse than right. Electronically Signed   By: Marijo Sanes M.D.   On: 09/04/2020 09:46    Assessment & Plan:    [name], MD

## 2020-12-03 NOTE — Assessment & Plan Note (Signed)
On B12 

## 2020-12-10 DIAGNOSIS — I700 Atherosclerosis of aorta: Secondary | ICD-10-CM | POA: Insufficient documentation

## 2020-12-10 NOTE — Assessment & Plan Note (Signed)
On diet, exercise 

## 2021-01-11 DIAGNOSIS — G4733 Obstructive sleep apnea (adult) (pediatric): Secondary | ICD-10-CM | POA: Diagnosis not present

## 2021-02-13 ENCOUNTER — Encounter: Payer: Self-pay | Admitting: Adult Health

## 2021-02-13 ENCOUNTER — Ambulatory Visit: Payer: Medicare Other | Admitting: Adult Health

## 2021-02-13 ENCOUNTER — Telehealth: Payer: Self-pay | Admitting: Adult Health

## 2021-02-13 VITALS — BP 119/65 | HR 71 | Ht 72.0 in | Wt 241.0 lb

## 2021-02-13 DIAGNOSIS — Z9989 Dependence on other enabling machines and devices: Secondary | ICD-10-CM

## 2021-02-13 DIAGNOSIS — G4733 Obstructive sleep apnea (adult) (pediatric): Secondary | ICD-10-CM | POA: Diagnosis not present

## 2021-02-13 NOTE — Telephone Encounter (Signed)
I saw this patient today for CPAP follow-up.  Him and his wife was wondering if inspire would be a good option for him.  He continues to struggle with his mask leaking despite many mask changes.  Your thoughts?

## 2021-02-13 NOTE — Progress Notes (Addendum)
PATIENT: [name] DOB: 08/23/1941  REASON FOR VISIT: follow up HISTORY FROM: patient  HISTORY OF PRESENT ILLNESS: Today 02/13/21: [name] is a 80 year old male with a history of obstructive sleep apnea on CPAP.  His download is below.  He reports that the new mask is better but the mask still leaks and he has to adjust it at night.  His wife is inquiring if inspire would be a good option for him as he has struggled with his mask leaking.  He returns today for an evaluation.     08/13/20: [name] is a 80 year old male with a history of obstructive sleep apnea on CPAP.  His download indicates that he uses machine 23 out of 30 days for compliance of 77%.  He uses machine greater than 4 hours 16 days for compliance of 53%.  On average he uses his machine 4 hours and 56 minutes.  His residual AHI is 9.8 on 7 to 14 cm of water with EPR 3.  Leak in the 95th percentile is 51.1 L/min.  Maximum pressure is 11.8.  He is with his wife today.  She reports that she does hear the mask leaking during the night.  He returns today for follow-up.  08/11/19: [name] is a 80 year old male with a history of obstructive sleep apnea on CPAP.  His download indicates that he use his machine 24 out of 30 days for compliance of 80%.  He uses machine greater than 4 hours 14 days for compliance of 47%.  On average he uses his machine 5 hours and 21 minutes.  His residual AHI is 6 on 7 to 14 cm of water with EPR 3.  His leak in the 95th percentile is 35.3 L/min.  He does need new supplies.  He reports that he does not like the tubing that comes out at the top of the head.  Patient reports that he still does not like using the CPAP but understands the benefit.  HISTORY 02/17/19:   [name] is a 80 year old male with a history of OSA on CPAP.  His CPAP download indicates that he uses machine 28 out of 30 days for compliance of 93%.  He uses machine greater than 4 hours 24 days for  compliance of 80%.  On average he uses his machine 5 hours and 25 minutes.  His residual AHI is 5.6 on 7 to 13 cm of water with EPR of 3.  His leak in the 95th percentile is 20.9 L/min.  He reports that he has made a conscious effort to try to use the CPAP more often.  He is following with a [facility] neurologist for myasthenia gravis and memory disturbance.  He joins me today for virtual visit.  REVIEW OF SYSTEMS: Out of a complete 14 system review of symptoms, the patient complains only of the following symptoms, and all other reviewed systems are negative. Epworth sleepiness score 5   ALLERGIES: Allergies  Allergen Reactions   Ferumoxytol Other (See Comments)    Stroke like symptoms; has hx of MG as well     HOME MEDICATIONS: Outpatient Medications Prior to Visit  Medication Sig Dispense Refill   Cholecalciferol (VITAMIN D3) 2000 units TABS Take 2,000 Units by mouth at bedtime.     Coenzyme Q10 (CO Q-10 PO) Take 1 tablet by mouth at bedtime.     Cyanocobalamin (VITAMIN B-12) 1000 MCG SUBL Place 1 tablet (1,000 mcg total) under the tongue daily. (Patient taking  differently: Place 1,000 mcg under the tongue at bedtime.) 100 tablet 3   donepezil (ARICEPT) 10 MG tablet TAKE 1 TABLET BY MOUTH EVERYDAY AT BEDTIME 90 tablet 2   famotidine (PEPCID) 40 MG tablet TAKE 1 TABLET BY MOUTH EVERY DAY 90 tablet 3   finasteride (PROSCAR) 5 MG tablet TAKE 1 TABLET BY MOUTH  DAILY 90 tablet 3   furosemide (LASIX) 20 MG tablet TAKE 1 TO 2 TABLETS BY  MOUTH DAILY IN THE EVENING 180 tablet 3   mycophenolate (CELLCEPT) 500 MG tablet Take 1,000 mg by mouth 2 (two) times daily.     potassium chloride (KLOR-CON) 10 MEQ tablet Take 1 tablet (10 mEq total) by mouth 2 (two) times daily. 180 tablet 3   predniSONE (DELTASONE) 10 MG tablet 25 mg qod 20 mg qod (Patient taking differently: Take 15 mg by mouth daily. 15 mg po once a day (rx from [facility])) 100 tablet 3   pyridostigmine (MESTINON) 60 MG tablet One in AM ,  one at lunch, 2 at bedtime. (Patient taking differently: One in AM , one in PM, and 1 at bedtime if he need) 450 tablet 3   sertraline (ZOLOFT) 100 MG tablet TAKE 1 TABLET BY MOUTH EVERY DAY 90 tablet 3   tamsulosin (FLOMAX) 0.4 MG CAPS capsule TAKE 2 CAPSULES BY MOUTH  DAILY 180 capsule 3   No facility-administered medications prior to visit.    PAST MEDICAL HISTORY: Past Medical History:  Diagnosis Date   Anemia    Blood transfusion without reported diagnosis 02/23/2014   3 units for iron deficiency anemia. as a baby - whenhe had pneumonia   BPH (benign prostatic hyperplasia)    CTS (carpal tunnel syndrome)    better   Dyspnea    GERD (gastroesophageal reflux disease)    Hyperlipidemia    Hypertension    "THEY DIAGNOSED ME YEARS AGO BUT LATELY IVE ACTUALLY HAD LOW BLOOD PRESSURES "    Insomnia    LBP (low back pain)    Osteoarthritis    Pneumonia    as a baby   Sleep apnea    no cpap    PAST SURGICAL HISTORY: Past Surgical History:  Procedure Laterality Date   CARDIAC CATHETERIZATION N/A 08/15/2015   Procedure: Left Heart Cath and Coronary Angiography;  Surgeon: Lorretta Harp, MD;  Location: [facility];  Service: Cardiovascular;  Laterality: N/A;   CARPAL TUNNEL RELEASE Bilateral 1996, 2004   Dr Lenoard Aden thumb surgery.  more than once   COLONOSCOPY  2005, 2012   diverticulosis.    ESOPHAGOGASTRODUODENOSCOPY N/A 02/23/2014   Procedure: ESOPHAGOGASTRODUODENOSCOPY (EGD);  Surgeon: Irene Shipper, MD;  Location: [facility];  Service: Endoscopy;  Laterality: N/A;   EYE SURGERY  2012   both cataracts, Lasik   LEFT KNEE ARTHROSCOPY Left 07/16/2017    [facility]    LUMBAR FUSION     x 3   REVERSE SHOULDER ARTHROPLASTY Left 07/04/2016   Procedure: LEFT REVERSE SHOULDER ARTHROPLASTY;  Surgeon: Netta Cedars, MD;  Location: [facility];  Service: Orthopedics;  Laterality: Left;   TONSILLECTOMY  1946   TOTAL KNEE ARTHROPLASTY  2003   Right   TOTAL KNEE ARTHROPLASTY Left  09/25/2017   Procedure: LEFT TOTAL KNEE ARTHROPLASTY;  Surgeon: Sydnee Cabal, MD;  Location: [facility];  Service: Orthopedics;  Laterality: Left;    FAMILY HISTORY: Family History  Problem Relation Age of Onset   Aneurysm Father  AAA   Heart disease Father 58       CHF   Alzheimer's disease Father    Cancer Brother    Alzheimer's disease Brother        dementia   Stomach cancer Brother    Cancer Mother 74       breast cancer   Heart disease Mother 22       MI   Autoimmune disease Son    CAD Brother    Colon cancer Neg Hx    Esophageal cancer Neg Hx    Pancreatic cancer Neg Hx    Prostate cancer Neg Hx    Rectal cancer Neg Hx     SOCIAL HISTORY: Social History   Socioeconomic History   Marital status: Married    Spouse name: Not on file   Number of children: Not on file   Years of education: Not on file   Highest education level: Not on file  Occupational History   Occupation: Retired    Fish farm manager: RETIRED    Comment: Dealer  Tobacco Use   Smoking status: Former    Pack years: 0.00    Types: Cigarettes    Quit date: 01/08/1987    Years since quitting: 34.1   Smokeless tobacco: Never  Vaping Use   Vaping Use: Never used  Substance and Sexual Activity   Alcohol use: Yes    Alcohol/week: 0.0 standard drinks    Comment: rare   Drug use: No   Sexual activity: Not Currently  Other Topics Concern   Not on file  Social History Narrative   He lives with wife in a 2 story home.  Has 2 children.   Retired Dealer.   Highest level of education:  High school   Regular Exercise -  YES   Social Determinants of Health   Financial Resource Strain: Not on file  Food Insecurity: Not on file  Transportation Needs: Not on file  Physical Activity: Not on file  Stress: Not on file  Social Connections: Not on file  Intimate Partner Violence: Not on file      PHYSICAL EXAM  Vitals:   02/13/21 1245  BP: 119/65  Pulse: 71  Weight: 241 lb (109.3 kg)   Height: 6' (1.829 m)   Body mass index is 32.69 kg/m.  Generalized: Well developed, in no acute distress  Chest: Lungs clear to auscultation bilaterally  Neurological examination  Mentation: Alert oriented to time, place, history taking. Follows all commands speech and language fluent Cranial nerve II-XII: Extraocular movements were full, visual field were full on confrontational test Head turning and shoulder shrug  were normal and symmetric. Motor: The motor testing reveals 5 over 5 strength of all 4 extremities. Good symmetric motor tone is noted throughout.  Sensory: Sensory testing is intact to soft touch on all 4 extremities. No evidence of extinction is noted.  Gait and station: Gait is normal.    DIAGNOSTIC DATA (LABS, IMAGING, TESTING) - I reviewed patient records, labs, notes, testing and imaging myself where available.  Lab Results  Component Value Date   WBC 7.7 10/18/2020   HGB 14.0 10/18/2020   HCT 42.5 10/18/2020   MCV 90.9 10/18/2020   PLT 213.0 10/18/2020      Component Value Date/Time   NA 139 10/18/2020 1102   NA 144 02/05/2018 1205   NA 140 05/16/2014 1327   K 3.6 10/18/2020 1102   K 3.8 05/16/2014 1327   CL 102 10/18/2020 1102  CO2 31 10/18/2020 1102   CO2 28 05/16/2014 1327   GLUCOSE 85 10/18/2020 1102   GLUCOSE 98 05/16/2014 1327   BUN 22 10/18/2020 1102   BUN 11 02/05/2018 1205   BUN 17.6 05/16/2014 1327   CREATININE 0.82 10/18/2020 1102   CREATININE 0.82 08/13/2015 0953   CREATININE 0.9 05/16/2014 1327   CALCIUM 9.5 10/18/2020 1102   CALCIUM 9.4 05/16/2014 1327   PROT 6.1 (L) 09/04/2020 0921   PROT 6.0 02/05/2018 1205   PROT 6.9 05/16/2014 1327   ALBUMIN 3.3 (L) 09/04/2020 0921   ALBUMIN 4.4 02/05/2018 1205   ALBUMIN 3.7 05/16/2014 1327   AST 29 09/04/2020 0921   AST 19 05/16/2014 1327   ALT 22 09/04/2020 0921   ALT 9 05/16/2014 1327   ALKPHOS 43 09/04/2020 0921   ALKPHOS 66 05/16/2014 1327   BILITOT 1.1 09/04/2020 0921   BILITOT  0.4 02/05/2018 1205   BILITOT 0.50 05/16/2014 1327   GFRNONAA >60 09/04/2020 0921   GFRNONAA 87 08/13/2015 0953   GFRAA >60 04/23/2020 1135   GFRAA >89 08/13/2015 0953   Lab Results  Component Value Date   CHOL 182 12/01/2017   HDL 48 12/01/2017   LDLCALC 99 12/01/2017   LDLDIRECT 157.5 07/05/2008   TRIG 174 (H) 12/01/2017   CHOLHDL 3.8 12/01/2017   Lab Results  Component Value Date   HGBA1C 5.5 02/05/2018   Lab Results  Component Value Date   NOBSJGGE36 629 05/23/2019   Lab Results  Component Value Date   TSH 1.17 05/23/2019      ASSESSMENT AND PLAN 80 y.o. year old male  has a past medical history of Anemia, Blood transfusion without reported diagnosis (02/23/2014), BPH (benign prostatic hyperplasia), CTS (carpal tunnel syndrome), Dyspnea, GERD (gastroesophageal reflux disease), Hyperlipidemia, Hypertension, Insomnia, LBP (low back pain), Osteoarthritis, Pneumonia, and Sleep apnea. here with:  Obstructive sleep apnea on CPAP  --Compliance is sub-optimal-would like him to use greater than 4 hours most nights --Residual AHI has improved --Encouraged patient to use CPAP nightly and greater than 4 hours each night --Advised patient that I would discuss the possibility of inspire with Dr. Brett Fairy --Follow-up in 6 months or sooner if needed  I spent 30 minutes of face-to-face and non-face-to-face time with patient.  This included previsit chart review, discussion of inspire device.    Ward Givens, MSN, NP-C 02/13/2021, 1:47 PM [facility] [address] [place], [place] [address] [phone]

## 2021-02-13 NOTE — Patient Instructions (Signed)
Continue using CPAP nightly and greater than 4 hours each night °If your symptoms worsen or you develop new symptoms please let us know.  ° °

## 2021-02-20 NOTE — Telephone Encounter (Signed)
Hi Carmen, Thank you for seeing Okey!   Do I need to place a referral for a sleep test?   AP

## 2021-02-28 ENCOUNTER — Emergency Department (HOSPITAL_COMMUNITY)
Admission: EM | Admit: 2021-02-28 | Discharge: 2021-02-28 | Disposition: A | Discharge status: Home / Self Care | Payer: Medicare Other | Attending: Emergency Medicine | Admitting: Emergency Medicine

## 2021-02-28 ENCOUNTER — Emergency Department (HOSPITAL_COMMUNITY): Payer: Medicare Other

## 2021-02-28 ENCOUNTER — Other Ambulatory Visit: Payer: Self-pay

## 2021-02-28 DIAGNOSIS — Z87891 Personal history of nicotine dependence: Secondary | ICD-10-CM | POA: Insufficient documentation

## 2021-02-28 DIAGNOSIS — R079 Chest pain, unspecified: Secondary | ICD-10-CM | POA: Diagnosis not present

## 2021-02-28 DIAGNOSIS — R6884 Jaw pain: Secondary | ICD-10-CM | POA: Diagnosis not present

## 2021-02-28 DIAGNOSIS — Z96653 Presence of artificial knee joint, bilateral: Secondary | ICD-10-CM | POA: Diagnosis not present

## 2021-02-28 DIAGNOSIS — Z96612 Presence of left artificial shoulder joint: Secondary | ICD-10-CM | POA: Diagnosis not present

## 2021-02-28 DIAGNOSIS — I10 Essential (primary) hypertension: Secondary | ICD-10-CM | POA: Insufficient documentation

## 2021-02-28 DIAGNOSIS — I499 Cardiac arrhythmia, unspecified: Secondary | ICD-10-CM | POA: Diagnosis not present

## 2021-02-28 DIAGNOSIS — E876 Hypokalemia: Secondary | ICD-10-CM | POA: Insufficient documentation

## 2021-02-28 DIAGNOSIS — G309 Alzheimer's disease, unspecified: Secondary | ICD-10-CM | POA: Insufficient documentation

## 2021-02-28 DIAGNOSIS — R0602 Shortness of breath: Secondary | ICD-10-CM | POA: Diagnosis not present

## 2021-02-28 DIAGNOSIS — R0789 Other chest pain: Secondary | ICD-10-CM | POA: Diagnosis not present

## 2021-02-28 DIAGNOSIS — Z743 Need for continuous supervision: Secondary | ICD-10-CM | POA: Diagnosis not present

## 2021-02-28 LAB — CBC
HCT: 44.8 % (ref 39.0–52.0)
Hemoglobin: 14.4 g/dL (ref 13.0–17.0)
MCH: 29.9 pg (ref 26.0–34.0)
MCHC: 32.1 g/dL (ref 30.0–36.0)
MCV: 93.1 fL (ref 80.0–100.0)
Platelets: 172 10*3/uL (ref 150–400)
RBC: 4.81 MIL/uL (ref 4.22–5.81)
RDW: 13.5 % (ref 11.5–15.5)
WBC: 5.8 10*3/uL (ref 4.0–10.5)
nRBC: 0 % (ref 0.0–0.2)

## 2021-02-28 LAB — TROPONIN I (HIGH SENSITIVITY)
Troponin I (High Sensitivity): 6 ng/L (ref <18)
Troponin I (High Sensitivity): 6 ng/L (ref <18)

## 2021-02-28 LAB — BASIC METABOLIC PANEL
Anion gap: 9 (ref 5–15)
BUN: 18 mg/dL (ref 8–23)
CO2: 27 mmol/L (ref 22–32)
Calcium: 9.3 mg/dL (ref 8.9–10.3)
Chloride: 103 mmol/L (ref 98–111)
Creatinine, Ser: 0.78 mg/dL (ref 0.61–1.24)
GFR, Estimated: >60 mL/min (ref >60)
Glucose, Bld: 88 mg/dL (ref 70–99)
Potassium: 3.4 mmol/L — ABNORMAL LOW (ref 3.5–5.1)
Sodium: 139 mmol/L (ref 135–145)

## 2021-02-28 NOTE — ED Triage Notes (Signed)
Pt arrives via [facility] for c/o midsternal non-radiating CP with R sided jaw pain. Pt denies pain on arrival. Pt self-administered 324 mg ASA prior to EMS arrival.   #20 L [facility]   EMS last VS - HR 52, 132/64, RR 18, 97% on RA.

## 2021-02-28 NOTE — Discharge Instructions (Addendum)
The testing today did not show any serious problems with your heart or lungs.  Continue taking your usual medications.  Your potassium level was a little bit low today.  Take 2 potassium pills each day for 3 days, then resume once a day.  Call your primary care provider for follow-up appointment to be seen as soon as possible.  Return here, if needed.

## 2021-02-28 NOTE — ED Provider Notes (Signed)
[facility] EMERGENCY DEPARTMENT Provider Note   CSN: [id number] Arrival date & time: 02/28/21  7124     History Chief Complaint  Patient presents with   Chest Pain    [name] is a 80 y.o. male.  HPI He arrives complaining of chest pain and right-sided jaw pain.  Transferred by EMS.  Patient took aspirin at home prior to arrival of EMS.  He has not had similar pain to this.  He did not take his morning medications.  His wife gave him the aspirin at home.  Patient does not have a prior cardiac diagnosis.  He had stress testing and cardiac catheterization about 4 years ago by report of his wife.  Patient not been ill recently.  He denies fever, chills, vomiting, dizziness or paresthesia.  He typically takes his medicines regularly.  There are no other known active modifying factors.    Past Medical History:  Diagnosis Date   Anemia    Blood transfusion without reported diagnosis 02/23/2014   3 units for iron deficiency anemia. as a baby - whenhe had pneumonia   BPH (benign prostatic hyperplasia)    CTS (carpal tunnel syndrome)    better   Dyspnea    GERD (gastroesophageal reflux disease)    Hyperlipidemia    Hypertension    "THEY DIAGNOSED ME YEARS AGO BUT LATELY IVE ACTUALLY HAD LOW BLOOD PRESSURES "    Insomnia    LBP (low back pain)    Osteoarthritis    Pneumonia    as a baby   Sleep apnea    no cpap    Patient Active Problem List   Diagnosis Date Noted   Atherosclerosis of aorta ([place]) 12/10/2020   Alzheimer disease ([place]) 11/29/2019   Depressed mood 05/23/2019   GERD (gastroesophageal reflux disease) 09/09/2018   Chronic coughing 06/07/2018   Hypokalemia 04/16/2018   History of plasmapheresis 04/12/2018   MCI (mild cognitive impairment) 04/12/2018   Myasthenia exacerbation ([place]) 04/12/2018   Weakness of both lower extremities 01/13/2018   Myasthenia gravis, AChR antibody positive ([place]) 01/13/2018   Dysarthria 12/04/2017   Dysphagia     CVA (cerebral vascular accident) ([place]) 11/30/2017   Angio-edema 11/26/2017   S/P knee replacement 09/25/2017   Knee pain, chronic 08/14/2017   OSA on CPAP 03/05/2017   OSA (obstructive sleep apnea) 09/30/2016   S/P shoulder replacement, left 07/04/2016   Preop exam for internal medicine 04/16/2016   Claudication ([place]) 09/14/2015   Pain in the chest    Abnormal stress test 08/14/2015   Dyspnea 07/23/2015   Paresthesia 10/25/2014   Weakness 04/13/2014   Heme positive stool 02/22/2014   Benign prostatic hyperplasia 02/22/2014   B12 deficiency 02/21/2014   Anemia, iron deficiency 02/21/2014   Leg weakness, bilateral 01/24/2014   Erectile dysfunction 01/24/2014   Edema 01/24/2014   URI, acute 08/31/2013   Cellulitis of arm, right 07/13/2013   Grief 07/13/2013   Well adult exam 03/01/2012   CARPAL TUNNEL SYNDROME 11/04/2010   BENIGN PROSTATIC HYPERTROPHY, WITH URINARY OBSTRUCTION 11/04/2010   WRIST PAIN 11/04/2010   LEG PAIN 07/16/2010   COUGH 05/20/2010   TOBACCO USE, QUIT 07/04/2009   WARTS, VIRAL, UNSPECIFIED 11/06/2008   INSOMNIA, PERSISTENT 01/14/2008   Essential hypertension 01/14/2008   RHINITIS 01/14/2008   ABDOMINAL PAIN 01/14/2008   TREMOR 09/07/2007   Actinic keratosis 08/12/2007   Neoplasm of uncertain behavior of skin 07/06/2007   LOW BACK PAIN 07/06/2007   Dyslipidemia 06/05/2007  Coronary atherosclerosis 06/05/2007   Osteoarthritis 06/05/2007    Past Surgical History:  Procedure Laterality Date   CARDIAC CATHETERIZATION N/A 08/15/2015   Procedure: Left Heart Cath and Coronary Angiography;  Surgeon: Lorretta Harp, MD;  Location: [facility];  Service: Cardiovascular;  Laterality: N/A;   CARPAL TUNNEL RELEASE Bilateral 1996, 2004   Dr Lenoard Aden thumb surgery.  more than once   COLONOSCOPY  2005, 2012   diverticulosis.    ESOPHAGOGASTRODUODENOSCOPY N/A 02/23/2014   Procedure: ESOPHAGOGASTRODUODENOSCOPY (EGD);  Surgeon: Irene Shipper, MD;   Location: [facility] ENDOSCOPY;  Service: Endoscopy;  Laterality: N/A;   EYE SURGERY  2012   both cataracts, Lasik   LEFT KNEE ARTHROSCOPY Left 07/16/2017    [facility]    LUMBAR FUSION     x 3   REVERSE SHOULDER ARTHROPLASTY Left 07/04/2016   Procedure: LEFT REVERSE SHOULDER ARTHROPLASTY;  Surgeon: Netta Cedars, MD;  Location: [facility];  Service: Orthopedics;  Laterality: Left;   TONSILLECTOMY  1946   TOTAL KNEE ARTHROPLASTY  2003   Right   TOTAL KNEE ARTHROPLASTY Left 09/25/2017   Procedure: LEFT TOTAL KNEE ARTHROPLASTY;  Surgeon: Sydnee Cabal, MD;  Location: [facility];  Service: Orthopedics;  Laterality: Left;       Family History  Problem Relation Age of Onset   Aneurysm Father        AAA   Heart disease Father 29       CHF   Alzheimer's disease Father    Cancer Brother    Alzheimer's disease Brother        dementia   Stomach cancer Brother    Cancer Mother 50       breast cancer   Heart disease Mother 38       MI   Autoimmune disease Son    CAD Brother    Colon cancer Neg Hx    Esophageal cancer Neg Hx    Pancreatic cancer Neg Hx    Prostate cancer Neg Hx    Rectal cancer Neg Hx     Social History   Tobacco Use   Smoking status: Former    Pack years: 0.00    Types: Cigarettes    Quit date: 01/08/1987    Years since quitting: 34.1   Smokeless tobacco: Never  Vaping Use   Vaping Use: Never used  Substance Use Topics   Alcohol use: Yes    Alcohol/week: 0.0 standard drinks    Comment: rare   Drug use: No    Home Medications Prior to Admission medications   Medication Sig Start Date End Date Taking? Authorizing Provider  Cholecalciferol (VITAMIN D3) 2000 units TABS Take 2,000 Units by mouth at bedtime.    [provider]  Coenzyme Q10 (CO Q-10 PO) Take 1 tablet by mouth at bedtime.    [provider]  Cyanocobalamin (VITAMIN B-12) 1000 MCG SUBL Place 1 tablet (1,000 mcg total) under the tongue daily. Patient taking differently: Place 1,000 mcg under  the tongue at bedtime. 02/21/14   [name], [name], MD  donepezil (ARICEPT) 10 MG tablet TAKE 1 TABLET BY MOUTH EVERYDAY AT BEDTIME 10/24/20   [name], [name], MD  famotidine (PEPCID) 40 MG tablet TAKE 1 TABLET BY MOUTH EVERY DAY 11/21/20   [name], [name], MD  finasteride (PROSCAR) 5 MG tablet TAKE 1 TABLET BY MOUTH  DAILY 09/29/20   [name], [name], MD  furosemide (LASIX) 20 MG tablet TAKE 1 TO 2 TABLETS BY  MOUTH DAILY IN THE EVENING 05/17/20   [name], [name], MD  mycophenolate (CELLCEPT) 500 MG tablet Take 1,000 mg by mouth 2 (two) times daily. 11/15/19   [provider]  potassium chloride (KLOR-CON) 10 MEQ tablet Take 1 tablet (10 mEq total) by mouth 2 (two) times daily. 12/03/20   [name], [name], MD  predniSONE (DELTASONE) 10 MG tablet 25 mg qod 20 mg qod Patient taking differently: Take 15 mg by mouth daily. 15 mg po once a day (rx from [facility]) 01/13/19   [name], [name], MD  pyridostigmine (MESTINON) 60 MG tablet One in AM , one at lunch, 2 at bedtime. Patient taking differently: One in AM , one in PM, and 1 at bedtime if he need 04/12/18   Dohmeier, Asencion Partridge, MD  sertraline (ZOLOFT) 100 MG tablet TAKE 1 TABLET BY MOUTH EVERY DAY 11/19/20   [name], [name], MD  tamsulosin (FLOMAX) 0.4 MG CAPS capsule TAKE 2 CAPSULES BY MOUTH  DAILY 06/11/20   [name], [name], MD    Allergies    Ferumoxytol  Review of Systems   Review of Systems  All other systems reviewed and are negative.  Physical Exam Updated Vital Signs BP 132/63   Pulse (!) 51   Temp 97.7 F (36.5 C) (Oral)   Resp 16   Ht 6' (1.829 m)   Wt 111.1 kg   SpO2 98%   BMI 33.23 kg/m   Physical Exam Vitals and nursing note reviewed.  Constitutional:      General: He is not in acute distress.    Appearance: He is well-developed. He is obese. He is not ill-appearing, toxic-appearing or diaphoretic.  HENT:     Head: Normocephalic and atraumatic.     Right Ear: External ear  normal.     Left Ear: External ear normal.  Eyes:     Conjunctiva/sclera: Conjunctivae normal.     Pupils: Pupils are equal, round, and reactive to light.  Neck:     Trachea: Phonation normal.  Cardiovascular:     Rate and Rhythm: Normal rate and regular rhythm.     Heart sounds: Normal heart sounds.  Pulmonary:     Effort: Pulmonary effort is normal.     Breath sounds: Normal breath sounds.  Abdominal:     Palpations: Abdomen is soft.     Tenderness: There is no abdominal tenderness.  Musculoskeletal:        General: Normal range of motion.     Cervical back: Normal range of motion and neck supple.     Right lower leg: No edema.     Left lower leg: No edema.  Skin:    General: Skin is warm and dry.  Neurological:     Mental Status: He is alert and oriented to person, place, and time.     Cranial Nerves: No cranial nerve deficit.     Sensory: No sensory deficit.     Motor: No abnormal muscle tone.     Coordination: Coordination normal.  Psychiatric:        Mood and Affect: Mood normal.        Behavior: Behavior normal.        Thought Content: Thought content normal.        Judgment: Judgment normal.    ED Results / Procedures / Treatments   Labs (all labs ordered are listed, but only abnormal results are displayed) Labs Reviewed  BASIC METABOLIC PANEL - Abnormal; Notable for the following components:  Result Value   Potassium 3.4 (*)    All other components within normal limits  CBC  TROPONIN I (HIGH SENSITIVITY)  TROPONIN I (HIGH SENSITIVITY)    EKG EKG Interpretation  Date/Time:  Thursday February 28 2021 10:02:51 EDT Ventricular Rate:  55 PR Interval:  220 QRS Duration: 97 QT Interval:  436 QTC Calculation: 417 R Axis:   28 Text Interpretation: Sinus or ectopic atrial rhythm Borderline prolonged PR interval Abnormal R-wave progression, early transition since last tracing no significant change Confirmed by Daleen Bo [phone]) on 02/28/2021 10:14:32  AM  Radiology DG Chest 2 View  Result Date: 02/28/2021 CLINICAL DATA:  Chest pain, shortness of breath EXAM: CHEST - 2 VIEW COMPARISON:  09/04/2020 FINDINGS: Chronic elevation of the left hemidiaphragm. Heart size within normal limits. Streaky left basilar opacity, likely atelectasis. Lungs are otherwise clear. No pleural effusion or pneumothorax. Degenerative changes of the thoracic spine. Reverse left shoulder arthroplasty. IMPRESSION: Chronic elevation of the left hemidiaphragm with streaky left basilar opacity, likely atelectasis. Electronically Signed   By: Davina Poke D.O.   On: 02/28/2021 10:46    Procedures Procedures   Medications Ordered in ED Medications - No data to display  ED Course  I have reviewed the triage vital signs and the nursing notes.  Pertinent labs & imaging results that were available during my care of the patient were reviewed by me and considered in my medical decision making (see chart for details).    MDM Rules/Calculators/A&P                           Patient Vitals for the past 24 hrs:  BP Temp Temp src Pulse Resp SpO2 Height Weight  02/28/21 1200 132/63 -- -- (!) 51 16 98 % -- --  02/28/21 1130 (!) 123/91 -- -- (!) 53 15 96 % -- --  02/28/21 1100 131/67 -- -- (!) 50 12 97 % -- --  02/28/21 1003 139/83 97.7 F (36.5 C) Oral (!) 51 16 99 % -- --  02/28/21 0956 -- -- -- -- -- -- 6' (1.829 m) 111.1 kg  02/28/21 0955 -- -- -- -- -- 97 % -- --    1:50 PM Reevaluation with update and discussion. After initial assessment and treatment, an updated evaluation reveals no change in clinical status, findings discussed and questions answered. Daleen Bo   Medical Decision Making:  This patient is presenting for evaluation of chest pain, which does require a range of treatment options, and is a complaint that involves a moderate risk of morbidity and mortality. The differential diagnoses include ACS, chest wall pain, GERD. I decided to review old  records, and in summary elderly male with history of myasthenia gravis, without cardiac history presenting for evaluation of chest pain.  I obtained additional historical information from wife at bedside.  Clinical Laboratory Tests Ordered, included CBC, Metabolic panel, and delta troponin . Review indicates normal findings except potassium minimally low. Radiologic Tests Ordered, included chest x-ray.  I independently Visualized: Radiograph images, which show No acute abnormality   Cardiac Monitor Tracing which shows normal sinus rhythm     Critical Interventions- Clinical evaluation, laboratory testing, radiography, observation and reassess  After These Interventions, the Patient was reevaluated and was found stable for discharge.  Patient with nonspecific complaints, and reassuring evaluation.  Doubt ACS, pneumonia, PE, significant metabolic disorder or hemodynamic instability.  Incidental mild hypokalemia can be treated by increasing potassium for 3  days (double dose), then resume normal dosing.  Recommend increase potassium in diet  CRITICAL CARE-no Performed by: Daleen Bo  Nursing Notes Reviewed/ Care Coordinated Applicable Imaging Reviewed Interpretation of Laboratory Data incorporated into ED treatment  The patient appears reasonably screened and/or stabilized for discharge and I doubt any other medical condition or other [facility] requiring further screening, evaluation, or treatment in the ED at this time prior to discharge.  Plan: Home Medications-continue usual, increase potassium for 3 days; Home Treatments-increase potassium in diet; return here if the recommended treatment, does not improve the symptoms; Recommended follow up-PCP 1 week and PR     Final Clinical Impression(s) / ED Diagnoses Final diagnoses:  Nonspecific chest pain  Hypokalemia    Rx / DC Orders ED Discharge Orders     None        Daleen Bo, MD 02/28/21 1358

## 2021-03-01 ENCOUNTER — Telehealth: Payer: Self-pay | Admitting: Internal Medicine

## 2021-03-01 MED ORDER — FUROSEMIDE 20 MG PO TABS: ORAL_TABLET | ORAL | 1 refills | Status: DC

## 2021-03-01 NOTE — Telephone Encounter (Signed)
Reviewed chart pt is up-to-date sent refills to mail service.../lmb  

## 2021-03-01 NOTE — Telephone Encounter (Signed)
1.Medication Requested: furosemide (LASIX) 20 MG tablet   2. Pharmacy (Name, [address], [place], [place]  3. On Med List: yes   4. Last Visit with PCP: 12-03-20  5. Next visit date with PCP: 04-04-21   Agent: Please be advised that RX refills may take up to 3 business days. We ask that you follow-up with your pharmacy.   Phone: [phone]

## 2021-03-28 ENCOUNTER — Other Ambulatory Visit (INDEPENDENT_AMBULATORY_CARE_PROVIDER_SITE_OTHER): Payer: Medicare Other

## 2021-03-28 ENCOUNTER — Other Ambulatory Visit: Payer: Self-pay

## 2021-03-28 DIAGNOSIS — D509 Iron deficiency anemia, unspecified: Secondary | ICD-10-CM

## 2021-03-28 DIAGNOSIS — N320 Bladder-neck obstruction: Secondary | ICD-10-CM

## 2021-03-28 DIAGNOSIS — E876 Hypokalemia: Secondary | ICD-10-CM

## 2021-03-28 DIAGNOSIS — I10 Essential (primary) hypertension: Secondary | ICD-10-CM | POA: Diagnosis not present

## 2021-03-28 DIAGNOSIS — E785 Hyperlipidemia, unspecified: Secondary | ICD-10-CM

## 2021-03-28 DIAGNOSIS — R531 Weakness: Secondary | ICD-10-CM

## 2021-03-28 LAB — COMPREHENSIVE METABOLIC PANEL
ALT: 10 U/L (ref 0–53)
AST: 11 U/L (ref 0–37)
Albumin: 4.1 g/dL (ref 3.5–5.2)
Alkaline Phosphatase: 49 U/L (ref 39–117)
BUN: 19 mg/dL (ref 6–23)
CO2: 27 mEq/L (ref 19–32)
Calcium: 9.5 mg/dL (ref 8.4–10.5)
Chloride: 106 mEq/L (ref 96–112)
Creatinine, Ser: 0.79 mg/dL (ref 0.40–1.50)
GFR: 84.18 mL/min (ref >60.00)
Glucose, Bld: 84 mg/dL (ref 70–99)
Potassium: 4.1 mEq/L (ref 3.5–5.1)
Sodium: 143 mEq/L (ref 135–145)
Total Bilirubin: 0.8 mg/dL (ref 0.2–1.2)
Total Protein: 6.2 g/dL (ref 6.0–8.3)

## 2021-03-28 LAB — CBC WITH DIFFERENTIAL/PLATELET
Basophils Absolute: 0 10*3/uL (ref 0.0–0.1)
Basophils Relative: 0.7 % (ref 0.0–3.0)
Eosinophils Absolute: 0.1 10*3/uL (ref 0.0–0.7)
Eosinophils Relative: 1.6 % (ref 0.0–5.0)
HCT: 42 % (ref 39.0–52.0)
Hemoglobin: 13.7 g/dL (ref 13.0–17.0)
Lymphocytes Relative: 19.5 % (ref 12.0–46.0)
Lymphs Abs: 1.1 10*3/uL (ref 0.7–4.0)
MCHC: 32.6 g/dL (ref 30.0–36.0)
MCV: 91.3 fl (ref 78.0–100.0)
Monocytes Absolute: 0.7 10*3/uL (ref 0.1–1.0)
Monocytes Relative: 13.2 % — ABNORMAL HIGH (ref 3.0–12.0)
Neutro Abs: 3.6 10*3/uL (ref 1.4–7.7)
Neutrophils Relative %: 65 % (ref 43.0–77.0)
Platelets: 167 10*3/uL (ref 150.0–400.0)
RBC: 4.6 Mil/uL (ref 4.22–5.81)
RDW: 15.1 % (ref 11.5–15.5)
WBC: 5.6 10*3/uL (ref 4.0–10.5)

## 2021-03-28 LAB — URINALYSIS, ROUTINE W REFLEX MICROSCOPIC
Bilirubin Urine: NEGATIVE
Hgb urine dipstick: NEGATIVE
Ketones, ur: NEGATIVE
Nitrite: NEGATIVE
RBC / HPF: NONE SEEN (ref 0–?)
Specific Gravity, Urine: >=1.03 — AB (ref 1.000–1.030)
Total Protein, Urine: NEGATIVE
Urine Glucose: NEGATIVE
Urobilinogen, UA: 0.2 (ref 0.0–1.0)
pH: 5.5 (ref 5.0–8.0)

## 2021-03-28 LAB — PSA: PSA: 0.21 ng/mL (ref 0.10–4.00)

## 2021-03-28 LAB — IBC PANEL
Iron: 77 ug/dL (ref 42–165)
Saturation Ratios: 19.8 % — ABNORMAL LOW (ref 20.0–50.0)
Transferrin: 278 mg/dL (ref 212.0–360.0)

## 2021-03-28 LAB — LIPID PANEL
Cholesterol: 246 mg/dL — ABNORMAL HIGH (ref 0–200)
HDL: 62.8 mg/dL (ref >39.00)
LDL Cholesterol: 162 mg/dL — ABNORMAL HIGH (ref 0–99)
NonHDL: 183.43
Total CHOL/HDL Ratio: 4
Triglycerides: 107 mg/dL (ref 0.0–149.0)
VLDL: 21.4 mg/dL (ref 0.0–40.0)

## 2021-03-28 LAB — TSH: TSH: 1.06 u[IU]/mL (ref 0.35–5.50)

## 2021-04-04 ENCOUNTER — Encounter: Payer: Self-pay | Admitting: Internal Medicine

## 2021-04-04 ENCOUNTER — Ambulatory Visit (INDEPENDENT_AMBULATORY_CARE_PROVIDER_SITE_OTHER): Payer: Medicare Other

## 2021-04-04 ENCOUNTER — Ambulatory Visit (INDEPENDENT_AMBULATORY_CARE_PROVIDER_SITE_OTHER): Payer: Medicare Other | Admitting: Internal Medicine

## 2021-04-04 ENCOUNTER — Other Ambulatory Visit: Payer: Self-pay

## 2021-04-04 VITALS — BP 118/80 | HR 60 | Temp 98.0°F | Ht 72.0 in | Wt 240.6 lb

## 2021-04-04 DIAGNOSIS — F419 Anxiety disorder, unspecified: Secondary | ICD-10-CM | POA: Diagnosis not present

## 2021-04-04 DIAGNOSIS — R29898 Other symptoms and signs involving the musculoskeletal system: Secondary | ICD-10-CM

## 2021-04-04 DIAGNOSIS — D500 Iron deficiency anemia secondary to blood loss (chronic): Secondary | ICD-10-CM

## 2021-04-04 DIAGNOSIS — N410 Acute prostatitis: Secondary | ICD-10-CM

## 2021-04-04 DIAGNOSIS — G309 Alzheimer's disease, unspecified: Secondary | ICD-10-CM | POA: Diagnosis not present

## 2021-04-04 DIAGNOSIS — F0280 Dementia in other diseases classified elsewhere without behavioral disturbance: Secondary | ICD-10-CM

## 2021-04-04 DIAGNOSIS — Z0000 Encounter for general adult medical examination without abnormal findings: Secondary | ICD-10-CM | POA: Diagnosis not present

## 2021-04-04 DIAGNOSIS — N419 Inflammatory disease of prostate, unspecified: Secondary | ICD-10-CM | POA: Insufficient documentation

## 2021-04-04 MED ORDER — DOXYCYCLINE HYCLATE 100 MG PO TABS: 100.0000 mg | ORAL_TABLET | Freq: Two times a day (BID) | ORAL | 0 refills | Status: DC

## 2021-04-04 MED ORDER — LORAZEPAM 1 MG PO TABS: 1.0000 mg | ORAL_TABLET | Freq: Two times a day (BID) | ORAL | 1 refills | Status: DC | PRN

## 2021-04-04 NOTE — Progress Notes (Addendum)
Subjective:   [name] is a 80 y.o. male who presents for Medicare Annual/Subsequent preventive examination.  Review of Systems     Cardiac Risk Factors include: advanced age (>54mn, >>49women);dyslipidemia;family history of premature cardiovascular disease;hypertension;male gender;obesity (BMI >30kg/m2)     Objective:    Today's Vitals   04/04/21 0907  BP: 118/80  Pulse: 60  Temp: 98 F (36.7 C)  SpO2: 96%  Weight: 240 lb 9.6 oz (109.1 kg)  Height: 6' (1.829 m)  PainSc: 0-No pain   Body mass index is 32.63 kg/m.  Advanced Directives 04/04/2021 02/28/2021 09/04/2020 04/23/2020 01/17/2020 07/05/2019 03/04/2018  Does Patient Have a Medical Advance Directive? Yes No No No Yes Yes Yes  Type of Advance Directive Living will;Healthcare Power of Attorney - - - - - -  Does patient want to make changes to medical advance directive? No - Patient declined - - - No - Patient declined No - Patient declined -  Copy of H[place] Chart? No - copy requested - - - - - -  Would patient like information on creating a medical advance directive? - - - No - Patient declined - - -  Pre-existing out of facility DNR order (yellow form or pink MOST form) - - - - - - -    Current Medications (verified) Outpatient Encounter Medications as of 04/04/2021  Medication Sig   Cholecalciferol (VITAMIN D3) 2000 units TABS Take 2,000 Units by mouth at bedtime.   Coenzyme Q10 (CO Q-10 PO) Take 1 tablet by mouth at bedtime.   Cyanocobalamin (VITAMIN B-12) 1000 MCG SUBL Place 1 tablet (1,000 mcg total) under the tongue daily. (Patient taking differently: Place 1,000 mcg under the tongue at bedtime.)   donepezil (ARICEPT) 10 MG tablet TAKE 1 TABLET BY MOUTH EVERYDAY AT BEDTIME   famotidine (PEPCID) 40 MG tablet TAKE 1 TABLET BY MOUTH EVERY DAY   finasteride (PROSCAR) 5 MG tablet TAKE 1 TABLET BY MOUTH  DAILY   furosemide (LASIX) 20 MG tablet TAKE 1 TO 2 TABLETS BY  MOUTH DAILY IN THE  EVENING   mycophenolate (CELLCEPT) 500 MG tablet Take 1,000 mg by mouth 2 (two) times daily.   potassium chloride (KLOR-CON) 10 MEQ tablet Take 1 tablet (10 mEq total) by mouth 2 (two) times daily.   predniSONE (DELTASONE) 10 MG tablet 25 mg qod 20 mg qod (Patient taking differently: Take 15 mg by mouth daily. 15 mg po [facility] a day [facility]   pyridostigmine (MESTINON) 60 MG tablet One in AM , one at lunch, 2 at bedtime. (Patient taking differently: One in AM , one in PM, and 1 at bedtime if he need)   sertraline (ZOLOFT) 100 MG tablet TAKE 1 TABLET BY MOUTH EVERY DAY   tamsulosin (FLOMAX) 0.4 MG CAPS capsule TAKE 2 CAPSULES BY MOUTH  DAILY   No facility-administered encounter medications on file as of 04/04/2021.    Allergies (verified) Ferumoxytol   History: Past Medical History:  Diagnosis Date   Anemia    Blood transfusion without reported diagnosis 02/23/2014   3 units for iron deficiency anemia. as a baby - whenhe had pneumonia   BPH (benign prostatic hyperplasia)    CTS (carpal tunnel syndrome)    better   Dyspnea    GERD (gastroesophageal reflux disease)    Hyperlipidemia    Hypertension    "THEY DIAGNOSED ME YEARS AGO BUT LATELY IVE ACTUALLY HAD LOW BLOOD PRESSURES "  Insomnia    LBP (low back pain)    Osteoarthritis    Pneumonia    as a baby   Sleep apnea    no cpap   Past Surgical History:  Procedure Laterality Date   CARDIAC CATHETERIZATION N/A 08/15/2015   Procedure: Left Heart Cath and Coronary Angiography;  Surgeon: Lorretta Harp, MD;  Location: [facility];  Service: Cardiovascular;  Laterality: N/A;   CARPAL TUNNEL RELEASE Bilateral 1996, 2004   Dr Lenoard Aden thumb surgery.  more than once   COLONOSCOPY  2005, 2012   diverticulosis.    ESOPHAGOGASTRODUODENOSCOPY N/A 02/23/2014   Procedure: ESOPHAGOGASTRODUODENOSCOPY (EGD);  Surgeon: Irene Shipper, MD;  Location: [facility];  Service: Endoscopy;  Laterality: N/A;   EYE SURGERY   2012   both cataracts, Lasik   LEFT KNEE ARTHROSCOPY Left 07/16/2017    [facility]    LUMBAR FUSION     x 3   REVERSE SHOULDER ARTHROPLASTY Left 07/04/2016   Procedure: LEFT REVERSE SHOULDER ARTHROPLASTY;  Surgeon: Netta Cedars, MD;  Location: [facility];  Service: Orthopedics;  Laterality: Left;   TONSILLECTOMY  1946   TOTAL KNEE ARTHROPLASTY  2003   Right   TOTAL KNEE ARTHROPLASTY Left 09/25/2017   Procedure: LEFT TOTAL KNEE ARTHROPLASTY;  Surgeon: Sydnee Cabal, MD;  Location: [facility];  Service: Orthopedics;  Laterality: Left;   Family History  Problem Relation Age of Onset   Aneurysm Father        AAA   Heart disease Father 62       CHF   Alzheimer's disease Father    Cancer Brother    Alzheimer's disease Brother        dementia   Stomach cancer Brother    Cancer Mother 29       breast cancer   Heart disease Mother 58       MI   Autoimmune disease Son    CAD Brother    Colon cancer Neg Hx    Esophageal cancer Neg Hx    Pancreatic cancer Neg Hx    Prostate cancer Neg Hx    Rectal cancer Neg Hx    Social History   Socioeconomic History   Marital status: Married    Spouse name: Not on file   Number of children: Not on file   Years of education: Not on file   Highest education level: Not on file  Occupational History   Occupation: Retired    Fish farm manager: RETIRED    Comment: Dealer  Tobacco Use   Smoking status: Former    Types: Cigarettes    Quit date: 01/08/1987    Years since quitting: 34.2   Smokeless tobacco: Never  Vaping Use   Vaping Use: Never used  Substance and Sexual Activity   Alcohol use: Yes    Alcohol/week: 0.0 standard drinks    Comment: rare   Drug use: No   Sexual activity: Not Currently  Other Topics Concern   Not on file  Social History Narrative   He lives with wife in a 2 story home.  Has 2 children.   Retired Dealer.   Highest level of education:  High school   Regular Exercise -  YES   Social Determinants of Health   Financial  Resource Strain: Low Risk    Difficulty of Paying Living Expenses: Not hard at all  Food Insecurity: No Food Insecurity   Worried About Charity fundraiser in the Last Year: Never  true   Ran Out of Food in the Last Year: Never true  Transportation Needs: No Transportation Needs   Lack of Transportation (Medical): No   Lack of Transportation (Non-Medical): No  Physical Activity: Sufficiently Active   Days of Exercise per Week: 5 days   Minutes of Exercise per Session: 30 min  Stress: No Stress Concern Present   Feeling of Stress : Not at all  Social Connections: Socially Integrated   Frequency of Communication with Friends and Family: More than three times a week   Frequency of Social Gatherings with Friends and Family: More than three times a week   Attends Religious Services: More than 4 times per year   Active Member of [employer] or Organizations: Yes   Attends Music therapist: More than 4 times per year   Marital Status: Married    Tobacco Counseling Counseling given: Not Answered   Clinical Intake:  Pre-visit preparation completed: Yes  Pain : No/denies pain Pain Score: 0-No pain     BMI - recorded: 32.63 Nutritional Status: BMI > 30  Obese Nutritional Risks: None Diabetes: No  How often do you need to have someone help you when you read instructions, pamphlets, or other written materials from your doctor or pharmacy?: 1 - Never What is the last grade level you completed in school?: 12th grade  Diabetic? no  Interpreter Needed?: No  Information entered by :: Lisette Abu, LPN   Activities of Daily Living In your present state of health, do you have any difficulty performing the following activities: 04/04/2021  Hearing? N  Comment wears hearing aids  Vision? N  Difficulty concentrating or making decisions? Y  Walking or climbing stairs? N  Dressing or bathing? N  Doing errands, shopping? Y  Preparing Food and eating ? N  Using the Toilet? N   In the past six months, have you accidently leaked urine? N  Do you have problems with loss of bowel control? N  Managing your Medications? N  Managing your Finances? N  Housekeeping or managing your Housekeeping? N  Some recent data might be hidden    Patient Care Team: [name], [name], MD as PCP - General Calvert Cantor, MD (Ophthalmology) Drema Halon, MD as Referring Physician (Hematology) Estill Batten, MD as Referring Physician (Neurology) Werner Lean, Martinique Lee, MD as Referring Physician (Neurology)  Indicate any recent Medical Services you may have received from other than [facility] providers in the past year (date may be approximate).     Assessment:   This is a routine wellness examination for Qamar.  Hearing/Vision screen No results found.  Dietary issues and exercise activities discussed: Current Exercise Habits: Home exercise routine, Type of exercise: walking;Other - see comments (rides bicycle), Time (Minutes): 30, Frequency (Times/Week): 5, Weekly Exercise (Minutes/Week): 150, Intensity: Moderate, Exercise limited by: neurologic condition(s)   Goals Addressed   None   Depression Screen PHQ 2/9 Scores 04/04/2021 04/04/2021 01/17/2020 06/03/2018 04/15/2017 06/06/2015 03/28/2014  PHQ - 2 Score 0 0 0 0 0 0 0  PHQ- 9 Score 0 - - - - - -    Fall Risk Fall Risk  04/04/2021 04/04/2021 01/17/2020 11/29/2019 04/12/2018  Falls in the past year? 0 0 0 0 No  Number falls in past yr: 0 0 0 0 -  Injury with Fall? 0 0 0 0 -  Risk Factor Category  - - - - -  Comment - - - - -  Risk for fall due to :  No Fall Risks No Fall Risks Impaired balance/gait - -  Risk for fall due to: Comment - - - - -  Follow up Falls evaluation completed - Falls evaluation completed - -    FALL RISK PREVENTION PERTAINING TO THE HOME:  Any stairs in or around the home? Yes  If so, are there any without handrails? No  Home free of loose throw rugs in walkways, pet beds, electrical cords, etc? Yes   Adequate lighting in your home to reduce risk of falls? Yes   ASSISTIVE DEVICES UTILIZED TO PREVENT FALLS:  Life alert? No  Use of a cane, walker or w/c? No  Grab bars in the bathroom? Yes  Shower chair or bench in shower? Yes  Elevated toilet seat or a handicapped toilet? Yes   TIMED UP AND GO:  Was the test performed? Yes .  Length of time to ambulate 10 feet: 9 sec.   Gait slow and steady without use of assistive device  Cognitive Function: Patient has current diagnosis of cognitive impairment. Patient is followed by neurology for ongoing assessment.   MMSE - Mini Mental State Exam 04/12/2018 02/05/2018  Orientation to time 3 4  Orientation to Place 5 4  Registration 3 3  Attention/ Calculation 5 5  Recall 0 1  Language- name 2 objects 2 2  Language- repeat 1 0  Language- follow 3 step command 3 3  Language- read & follow direction 0 1  Write a sentence 1 1  Copy design 1 1  Total score 24 25        Immunizations Immunization History  Administered Date(s) Administered   H1N1 08/02/2008   Influenza Whole 06/10/2006, 06/15/2008, 05/22/2009, 05/25/2010, 05/26/2011, 04/25/2012   Influenza, High Dose Seasonal PF 04/15/2017, 05/09/2019, 04/22/2020   Influenza,inj,Quad PF,6+ Mos 05/23/2013, 04/13/2014, 05/23/2015, 04/16/2016   PFIZER(Purple Top)SARS-COV-2 Vaccination 09/02/2019, 09/23/2019, 04/29/2020   Pneumococcal Conjugate-13 07/10/2014   Pneumococcal Polysaccharide-23 06/25/2006, 12/30/2016, 04/09/2020   Td 07/04/2009   Tdap 06/04/2013   Zoster Recombinat (Shingrix) 12/28/2016, 05/09/2017   Zoster, Live 12/24/2006    TDAP status: Up to date  Flu Vaccine status: Up to date  Pneumococcal vaccine status: Up to date  Covid-19 vaccine status: Completed vaccines  Qualifies for Shingles Vaccine? Yes   Zostavax completed Yes   Shingrix Completed?: Yes  Screening Tests Health Maintenance  Topic Date Due   COVID-19 Vaccine (4 - Booster for Pfizer series)  07/29/2020   INFLUENZA VACCINE  03/25/2021   TETANUS/TDAP  06/05/2023   PNA vac Low Risk Adult  Completed   Zoster Vaccines- Shingrix  Completed   HPV VACCINES  Aged Out    Health Maintenance  Health Maintenance Due  Topic Date Due   COVID-19 Vaccine (4 - Booster for Pfizer series) 07/29/2020   INFLUENZA VACCINE  03/25/2021    Colorectal cancer screening: No longer required.   Lung Cancer Screening: (Low Dose CT Chest recommended if Age 18-80 years, 30 pack-year currently smoking OR have quit w/in 15years.) does not qualify.   Lung Cancer Screening Referral: no  Additional Screening:  Hepatitis C Screening: does not qualify; Completed no  Vision Screening: Recommended annual ophthalmology exams for early detection of glaucoma and other disorders of the eye. Is the patient up to date with their annual eye exam?  Yes  Who is the provider or what is the name of the office in which the patient attends annual eye exams? Calvert Cantor, MD. If pt is not established with a provider, would  they like to be referred to a provider to establish care? No .   Dental Screening: Recommended annual dental exams for proper oral hygiene  Community Resource Referral / Chronic Care Management: CRR required this visit?  No   CCM required this visit?  No      Plan:     I have personally reviewed and noted the following in the patient's chart:   Medical and social history Use of alcohol, tobacco or illicit drugs  Current medications and supplements including opioid prescriptions. Patient is not currently taking opioid prescriptions. Functional ability and status Nutritional status Physical activity Advanced directives List of other physicians Hospitalizations, surgeries, and ER visits in previous 12 months Vitals Screenings to include cognitive, depression, and falls Referrals and appointments  In addition, I have reviewed and discussed with patient certain preventive protocols,  quality metrics, and best practice recommendations. A written personalized care plan for preventive services as well as general preventive health recommendations were provided to patient.     Sheral Flow, LPN   [id number]   Nurse Notes: N/A   Medical screening examination/treatment/procedure(s) were performed by non-physician practitioner and as supervising physician I was immediately available for consultation/collaboration.  I agree with above. Lew Dawes, MD

## 2021-04-04 NOTE — Patient Instructions (Signed)
For a mild COVID-19 case - take zinc 50 mg a day for 1 week, vitamin C 1000 mg daily for 1 week, vitamin D2 50,000 units weekly for 2 months (unless  taking vitamin D daily already), an antioxidant Quercetin 500 mg twice a day for 1 week (if you can get it quick enough). Take Allegra or Benadryl.  Maintain good oral hydration and take Tylenol for high fever.

## 2021-04-04 NOTE — Assessment & Plan Note (Signed)
Statin intolerant Fish oil

## 2021-04-04 NOTE — Patient Instructions (Signed)
[name] , Thank you for taking time to come for your Medicare Wellness Visit. I appreciate your ongoing commitment to your health goals. Please review the following plan we discussed and let me know if I can assist you in the future.   Screening recommendations/referrals: Colonoscopy: last done 03/14/2019 Recommended yearly ophthalmology/optometry visit for glaucoma screening and checkup Recommended yearly dental visit for hygiene and checkup  Vaccinations: Influenza vaccine: 04/22/2020 Pneumococcal vaccine: 07/10/2014, 04/09/2020 Tdap vaccine: 06/04/2013; due every 10 years Shingles vaccine: 12/28/2016, 05/09/2017   Covid-19: 09/02/2019, 09/23/2019, 04/29/2020  Advanced directives: Please bring a copy of your health care power of attorney and living will to the office at your convenience.  Conditions/risks identified: Yes; Client understands the importance of follow-up with providers by attending scheduled visits and discussed goals to eat healthier, increase physical activity, exercise the brain, socialize more, get enough sleep and make time for laughter.  Next appointment: Please schedule your next Medicare Wellness Visit with your Nurse Health Advisor in 1 year by calling [phone].  Preventive Care 80 Years and Older, Male Preventive care refers to lifestyle choices and visits with your health care provider that can promote health and wellness. What does preventive care include? A yearly physical exam. This is also called an annual well check. Dental exams once or twice a year. Routine eye exams. Ask your health care provider how often you should have your eyes checked. Personal lifestyle choices, including: Daily care of your teeth and gums. Regular physical activity. Eating a healthy diet. Avoiding tobacco and drug use. Limiting alcohol use. Practicing safe sex. Taking low doses of aspirin every day. Taking vitamin and mineral supplements as recommended by your health care  provider. What happens during an annual well check? The services and screenings done by your health care provider during your annual well check will depend on your age, overall health, lifestyle risk factors, and family history of disease. Counseling  Your health care provider may ask you questions about your: Alcohol use. Tobacco use. Drug use. Emotional well-being. Home and relationship well-being. Sexual activity. Eating habits. History of falls. Memory and ability to understand (cognition). Work and work Statistician. Screening  You may have the following tests or measurements: Height, weight, and BMI. Blood pressure. Lipid and cholesterol levels. These may be checked every 5 years, or more frequently if you are over 19 years old. Skin check. Lung cancer screening. You may have this screening every year starting at age 85 if you have a 30-pack-year history of smoking and currently smoke or have quit within the past 15 years. Fecal occult blood test (FOBT) of the stool. You may have this test every year starting at age 61. Flexible sigmoidoscopy or colonoscopy. You may have a sigmoidoscopy every 5 years or a colonoscopy every 10 years starting at age 68. Prostate cancer screening. Recommendations will vary depending on your family history and other risks. Hepatitis C blood test. Hepatitis B blood test. Sexually transmitted disease (STD) testing. Diabetes screening. This is done by checking your blood sugar (glucose) after you have not eaten for a while (fasting). You may have this done every 1-3 years. Abdominal aortic aneurysm (AAA) screening. You may need this if you are a current or former smoker. Osteoporosis. You may be screened starting at age 28 if you are at high risk. Talk with your health care provider about your test results, treatment options, and if necessary, the need for more tests. Vaccines  Your health care provider may recommend certain vaccines,  such  as: Influenza vaccine. This is recommended every year. Tetanus, diphtheria, and acellular pertussis (Tdap, Td) vaccine. You may need a Td booster every 10 years. Zoster vaccine. You may need this after age 5. Pneumococcal 13-valent conjugate (PCV13) vaccine. One dose is recommended after age 20. Pneumococcal polysaccharide (PPSV23) vaccine. One dose is recommended after age 10. Talk to your health care provider about which screenings and vaccines you need and how often you need them. This information is not intended to replace advice given to you by your health care provider. Make sure you discuss any questions you have with your health care provider. Document Released: 09/07/2015 Document Revised: 04/30/2016 Document Reviewed: 06/12/2015 Elsevier Interactive Patient Education  2017 [employer] Prevention in the Home Falls can cause injuries. They can happen to people of all ages. There are many things you can do to make your home safe and to help prevent falls. What can I do on the outside of my home? Regularly fix the edges of walkways and driveways and fix any cracks. Remove anything that might make you trip as you walk through a door, such as a raised step or threshold. Trim any bushes or trees on the path to your home. Use bright outdoor lighting. Clear any walking paths of anything that might make someone trip, such as rocks or tools. Regularly check to see if handrails are loose or broken. Make sure that both sides of any steps have handrails. Any raised decks and porches should have guardrails on the edges. Have any leaves, snow, or ice cleared regularly. Use sand or salt on walking paths during winter. Clean up any spills in your garage right away. This includes oil or grease spills. What can I do in the bathroom? Use night lights. Install grab bars by the toilet and in the tub and shower. Do not use towel bars as grab bars. Use non-skid mats or decals in the tub or  shower. If you need to sit down in the shower, use a plastic, non-slip stool. Keep the floor dry. Clean up any water that spills on the floor as soon as it happens. Remove soap buildup in the tub or shower regularly. Attach bath mats securely with double-sided non-slip rug tape. Do not have throw rugs and other things on the floor that can make you trip. What can I do in the bedroom? Use night lights. Make sure that you have a light by your bed that is easy to reach. Do not use any sheets or blankets that are too big for your bed. They should not hang down onto the floor. Have a firm chair that has side arms. You can use this for support while you get dressed. Do not have throw rugs and other things on the floor that can make you trip. What can I do in the kitchen? Clean up any spills right away. Avoid walking on wet floors. Keep items that you use a lot in easy-to-reach places. If you need to reach something above you, use a strong step stool that has a grab bar. Keep electrical cords out of the way. Do not use floor polish or wax that makes floors slippery. If you must use wax, use non-skid floor wax. Do not have throw rugs and other things on the floor that can make you trip. What can I do with my stairs? Do not leave any items on the stairs. Make sure that there are handrails on both sides of the stairs and  use them. Fix handrails that are broken or loose. Make sure that handrails are as long as the stairways. Check any carpeting to make sure that it is firmly attached to the stairs. Fix any carpet that is loose or worn. Avoid having throw rugs at the top or bottom of the stairs. If you do have throw rugs, attach them to the floor with carpet tape. Make sure that you have a light switch at the top of the stairs and the bottom of the stairs. If you do not have them, ask someone to add them for you. What else can I do to help prevent falls? Wear shoes that: Do not have high heels. Have  rubber bottoms. Are comfortable and fit you well. Are closed at the toe. Do not wear sandals. If you use a stepladder: Make sure that it is fully opened. Do not climb a closed stepladder. Make sure that both sides of the stepladder are locked into place. Ask someone to hold it for you, if possible. Clearly mark and make sure that you can see: Any grab bars or handrails. First and last steps. Where the edge of each step is. Use tools that help you move around (mobility aids) if they are needed. These include: Canes. Walkers. Scooters. Crutches. Turn on the lights when you go into a dark area. Replace any light bulbs as soon as they burn out. Set up your furniture so you have a clear path. Avoid moving your furniture around. If any of your floors are uneven, fix them. If there are any pets around you, be aware of where they are. Review your medicines with your doctor. Some medicines can make you feel dizzy. This can increase your chance of falling. Ask your doctor what other things that you can do to help prevent falls. This information is not intended to replace advice given to you by your health care provider. Make sure you discuss any questions you have with your health care provider. Document Released: 06/07/2009 Document Revised: 01/17/2016 Document Reviewed: 09/15/2014 Elsevier Interactive Patient Education  2017 [employer].

## 2021-04-04 NOTE — Progress Notes (Signed)
Subjective:  Patient ID: [name], male    DOB: [birth year]  Age: 80 y.o. MRN: [id number]  CC: Follow-up (4 month f/u)   HPI HANNON CERA presents for MG, GI blood loss anemia, dyslipidemia, abn UA C/o anxiety episodes  Outpatient Medications Prior to Visit  Medication Sig Dispense Refill   Cholecalciferol (VITAMIN D3) 2000 units TABS Take 2,000 Units by mouth at bedtime.     Coenzyme Q10 (CO Q-10 PO) Take 1 tablet by mouth at bedtime.     Cyanocobalamin (VITAMIN B-12) 1000 MCG SUBL Place 1 tablet (1,000 mcg total) under the tongue daily. (Patient taking differently: Place 1,000 mcg under the tongue at bedtime.) 100 tablet 3   donepezil (ARICEPT) 10 MG tablet TAKE 1 TABLET BY MOUTH EVERYDAY AT BEDTIME 90 tablet 2   famotidine (PEPCID) 40 MG tablet TAKE 1 TABLET BY MOUTH EVERY DAY 90 tablet 3   finasteride (PROSCAR) 5 MG tablet TAKE 1 TABLET BY MOUTH  DAILY 90 tablet 3   furosemide (LASIX) 20 MG tablet TAKE 1 TO 2 TABLETS BY  MOUTH DAILY IN THE EVENING 270 tablet 1   mycophenolate (CELLCEPT) 500 MG tablet Take 1,000 mg by mouth 2 (two) times daily.     potassium chloride (KLOR-CON) 10 MEQ tablet Take 1 tablet (10 mEq total) by mouth 2 (two) times daily. 180 tablet 3   predniSONE (DELTASONE) 10 MG tablet 25 mg qod 20 mg qod (Patient taking differently: Take 15 mg by mouth daily. 15 mg po once a day (rx from [facility])) 100 tablet 3   pyridostigmine (MESTINON) 60 MG tablet One in AM , one at lunch, 2 at bedtime. (Patient taking differently: One in AM , one in PM, and 1 at bedtime if he need) 450 tablet 3   sertraline (ZOLOFT) 100 MG tablet TAKE 1 TABLET BY MOUTH EVERY DAY 90 tablet 3   tamsulosin (FLOMAX) 0.4 MG CAPS capsule TAKE 2 CAPSULES BY MOUTH  DAILY 180 capsule 3   No facility-administered medications prior to visit.    ROS: Review of Systems  Constitutional:  Negative for appetite change, fatigue and unexpected weight change.  HENT:  Negative for congestion,  nosebleeds, sneezing, sore throat and trouble swallowing.   Eyes:  Negative for itching and visual disturbance.  Respiratory:  Negative for cough.   Cardiovascular:  Negative for chest pain, palpitations and leg swelling.  Gastrointestinal:  Negative for abdominal distention, blood in stool, diarrhea and nausea.  Genitourinary:  Negative for frequency and hematuria.  Musculoskeletal:  Negative for back pain, gait problem, joint swelling and neck pain.  Skin:  Negative for rash.  Neurological:  Negative for dizziness, tremors, speech difficulty and weakness.  Psychiatric/Behavioral:  Positive for decreased concentration. Negative for agitation, dysphoric mood, sleep disturbance and suicidal ideas. The patient is not nervous/anxious.    Objective:  BP 118/80 (BP Location: Left Arm)   Pulse 60   Temp 98 F (36.7 C) (Oral)   Ht 6' (1.829 m)   Wt 240 lb 9.6 oz (109.1 kg)   SpO2 96%   BMI 32.63 kg/m   BP Readings from Last 3 Encounters:  04/04/21 118/80  04/04/21 118/80  02/28/21 (!) 141/72    Wt Readings from Last 3 Encounters:  04/04/21 240 lb 9.6 oz (109.1 kg)  04/04/21 240 lb 9.6 oz (109.1 kg)  02/28/21 245 lb (111.1 kg)    Physical Exam Constitutional:      General: He is not in acute distress.  Appearance: He is well-developed.     Comments: NAD  Eyes:     Conjunctiva/sclera: Conjunctivae normal.     Pupils: Pupils are equal, round, and reactive to light.  Neck:     Thyroid: No thyromegaly.     Vascular: No JVD.  Cardiovascular:     Rate and Rhythm: Normal rate and regular rhythm.     Heart sounds: Normal heart sounds. No murmur heard.   No friction rub. No gallop.  Pulmonary:     Effort: Pulmonary effort is normal. No respiratory distress.     Breath sounds: Normal breath sounds. No wheezing or rales.  Chest:     Chest wall: No tenderness.  Abdominal:     General: Bowel sounds are normal. There is no distension.     Palpations: Abdomen is soft. There is no  mass.     Tenderness: There is no abdominal tenderness. There is no guarding or rebound.  Musculoskeletal:        General: No tenderness. Normal range of motion.     Cervical back: Normal range of motion.  Lymphadenopathy:     Cervical: No cervical adenopathy.  Skin:    General: Skin is warm and dry.     Findings: No rash.  Neurological:     Mental Status: He is alert and oriented to person, place, and time.     Cranial Nerves: No cranial nerve deficit.     Motor: No abnormal muscle tone.     Coordination: Coordination normal.     Gait: Gait normal.     Deep Tendon Reflexes: Reflexes are normal and symmetric.  Psychiatric:        Behavior: Behavior normal.        Thought Content: Thought content normal.        Judgment: Judgment normal.    Lab Results  Component Value Date   WBC 5.6 03/28/2021   HGB 13.7 03/28/2021   HCT 42.0 03/28/2021   PLT 167.0 03/28/2021   GLUCOSE 84 03/28/2021   CHOL 246 (H) 03/28/2021   TRIG 107.0 03/28/2021   HDL 62.80 03/28/2021   LDLDIRECT 157.5 07/05/2008   LDLCALC 162 (H) 03/28/2021   ALT 10 03/28/2021   AST 11 03/28/2021   NA 143 03/28/2021   K 4.1 03/28/2021   CL 106 03/28/2021   CREATININE 0.79 03/28/2021   BUN 19 03/28/2021   CO2 27 03/28/2021   TSH 1.06 03/28/2021   PSA 0.21 03/28/2021   INR 0.99 11/30/2017   HGBA1C 5.5 02/05/2018    DG Chest 2 View  Result Date: 02/28/2021 CLINICAL DATA:  Chest pain, shortness of breath EXAM: CHEST - 2 VIEW COMPARISON:  09/04/2020 FINDINGS: Chronic elevation of the left hemidiaphragm. Heart size within normal limits. Streaky left basilar opacity, likely atelectasis. Lungs are otherwise clear. No pleural effusion or pneumothorax. Degenerative changes of the thoracic spine. Reverse left shoulder arthroplasty. IMPRESSION: Chronic elevation of the left hemidiaphragm with streaky left basilar opacity, likely atelectasis. Electronically Signed   By: Davina Poke D.O.   On: 02/28/2021 10:46     Assessment & Plan:     [name], MD

## 2021-04-04 NOTE — Assessment & Plan Note (Signed)
New anxiety attacks Rx - Lorazepam prn  Potential benefits of a short or long term benzodiazepines  use as well as potential risks  and complications were explained to the patient and were aknowledged.

## 2021-04-04 NOTE — Assessment & Plan Note (Signed)
Try Lion's mane 

## 2021-04-04 NOTE — Assessment & Plan Note (Signed)
Will monitor CBC

## 2021-04-04 NOTE — Assessment & Plan Note (Signed)
Probable Doxy x 2 wks

## 2021-04-09 ENCOUNTER — Telehealth: Payer: Self-pay | Admitting: Lab

## 2021-04-09 NOTE — Chronic Care Management (AMB) (Signed)
  Chronic Care Management   Note  04/09/2021 Name: BEVAN BUGGY MRN: [id number] DOB: 1941-01-07  HARVINDER KOZUB is a 80 y.o. year old male who is a primary care patient of [name], [name], MD. I reached out to Caswell Corwin by phone today in response to a referral sent by Mr. Aarn Blocker Sagar's PCP, [name], [name], MD.   Mr. Jasin was given information about Chronic Care Management services today including:  CCM service includes personalized support from designated clinical staff supervised by his physician, including individualized plan of care and coordination with other care providers 24/7 contact phone numbers for assistance for urgent and routine care needs. Service will only be billed when office clinical staff spend 20 minutes or more in a month to coordinate care. Only one practitioner may furnish and bill the service in a calendar month. The patient may stop CCM services at any time (effective at the end of the month) by phone call to the office staff.   Patient agreed to services and verbal consent obtained.   Follow up plan:   [place]

## 2021-04-11 DIAGNOSIS — G4733 Obstructive sleep apnea (adult) (pediatric): Secondary | ICD-10-CM | POA: Diagnosis not present

## 2021-04-12 ENCOUNTER — Other Ambulatory Visit: Payer: Self-pay | Admitting: Internal Medicine

## 2021-05-01 DIAGNOSIS — Z961 Presence of intraocular lens: Secondary | ICD-10-CM | POA: Diagnosis not present

## 2021-05-01 DIAGNOSIS — H43813 Vitreous degeneration, bilateral: Secondary | ICD-10-CM | POA: Diagnosis not present

## 2021-05-01 DIAGNOSIS — H26492 Other secondary cataract, left eye: Secondary | ICD-10-CM | POA: Diagnosis not present

## 2021-05-01 DIAGNOSIS — H04123 Dry eye syndrome of bilateral lacrimal glands: Secondary | ICD-10-CM | POA: Diagnosis not present

## 2021-05-10 ENCOUNTER — Telehealth: Payer: Self-pay

## 2021-05-10 NOTE — Chronic Care Management (AMB) (Signed)
Chronic Care Management Pharmacy Assistant   Name: [name]  MRN: [id number] DOB: 05-27-1941  [name] is an 80 y.o. year old male who presents for his initial CCM visit with the clinical pharmacist.  Recent office visits:  04/04/21 Lew Dawes MD PCP- pt was seen for Anxiety. Labs were ordered and pt was started on Doxycycline 100 mgand Lorazepam 1 mg. Follow up in 3 months.   Recent consult visits:  02/13/21 Ward Givens NP Neurology- pt was seen for OSA on CPAP. Follow up 6 months or prn  Hospital visits:  Medication Reconciliation was completed by comparing discharge summary, patient's EMR and Pharmacy list, and upon discussion with patient.  Admitted to the hospital on 02/28/21 due to Chest pain. Discharge date was 02/28/21. Discharged from [facility]?Medications Started at [facility] Discharge:?? -started none  Medication Changes at Hospital Discharge: -Changed none  Medications Discontinued at Hospital Discharge: -Stopped none  Medications that remain the same after Hospital Discharge:??  -All other medications will remain the same.    Medications: Outpatient Encounter Medications as of 05/10/2021  Medication Sig   Cholecalciferol (VITAMIN D3) 2000 units TABS Take 2,000 Units by mouth at bedtime.   Coenzyme Q10 (CO Q-10 PO) Take 1 tablet by mouth at bedtime.   Cyanocobalamin (VITAMIN B-12) 1000 MCG SUBL Place 1 tablet (1,000 mcg total) under the tongue daily. (Patient taking differently: Place 1,000 mcg under the tongue at bedtime.)   donepezil (ARICEPT) 10 MG tablet TAKE 1 TABLET BY MOUTH EVERYDAY AT BEDTIME   doxycycline (VIBRA-TABS) 100 MG tablet Take 1 tablet (100 mg total) by mouth 2 (two) times daily.   famotidine (PEPCID) 40 MG tablet TAKE 1 TABLET BY MOUTH EVERY DAY   finasteride (PROSCAR) 5 MG tablet TAKE 1 TABLET BY MOUTH  DAILY   furosemide (LASIX) 20 MG tablet TAKE 1 TO 2 TABLETS BY  MOUTH DAILY IN THE EVENING    LORazepam (ATIVAN) 1 MG tablet Take 1 tablet (1 mg total) by mouth 2 (two) times daily as needed for anxiety.   mycophenolate (CELLCEPT) 500 MG tablet Take 1,000 mg by mouth 2 (two) times daily.   potassium chloride (KLOR-CON) 10 MEQ tablet Take 1 tablet (10 mEq total) by mouth 2 (two) times daily.   predniSONE (DELTASONE) 10 MG tablet 25 mg qod 20 mg qod (Patient taking differently: Take 15 mg by mouth daily. 15 mg po once a day (rx from [facility]))   pyridostigmine (MESTINON) 60 MG tablet One in AM , one at lunch, 2 at bedtime. (Patient taking differently: One in AM , one in PM, and 1 at bedtime if he need)   sertraline (ZOLOFT) 100 MG tablet TAKE 1 TABLET BY MOUTH EVERY DAY   tamsulosin (FLOMAX) 0.4 MG CAPS capsule Take 2 capsules by mouth every day   No facility-administered encounter medications on file as of 05/10/2021.    Current Medication List Cholecalciferol (VITAMIN D3) 2000 units TABS Coenzyme Q10 (CO Q-10 PO) last filled  Cyanocobalamin (VITAMIN B-12) 1000 MCG SUBL last filled donepezil (ARICEPT) 10 MG tablet last filled 04/23/21 30 DS doxycycline (VIBRA-TABS) 100 MG tablet last filled 04/04/21 14 DS famotidine (PEPCID) 40 MG tablet last filled 04/23/21 30 DS finasteride (PROSCAR) 5 MG tablet last filled 04/23/21 30 DS furosemide (LASIX) 20 MG tablet last filled 04/23/21 30 DS LORazepam (ATIVAN) 1 MG tablet last filled 04/04/21 30 DS mycophenolate (CELLCEPT) 500 MG tablet last filled 04/04/21 30 DS potassium  chloride (KLOR-CON) 10 MEQ tablet last filled 04/23/21 30 DS predniSONE (DELTASONE) 10 MG tablet last filled 04/23/21 30 DS pyridostigmine (MESTINON) 60 MG tablet last filled 04/23/21 50 DS sertraline (ZOLOFT) 100 MG tablet last filled 04/23/21 30 DS tamsulosin (FLOMAX) 0.4 MG CAPS capsule last filled 04/23/21 30 DS  [place] Clinical Pharmacist Assistant [phone]

## 2021-05-16 ENCOUNTER — Ambulatory Visit (INDEPENDENT_AMBULATORY_CARE_PROVIDER_SITE_OTHER): Payer: Medicare Other

## 2021-05-16 ENCOUNTER — Other Ambulatory Visit: Payer: Self-pay

## 2021-05-16 DIAGNOSIS — I10 Essential (primary) hypertension: Secondary | ICD-10-CM

## 2021-05-16 DIAGNOSIS — I639 Cerebral infarction, unspecified: Secondary | ICD-10-CM

## 2021-05-16 DIAGNOSIS — G309 Alzheimer's disease, unspecified: Secondary | ICD-10-CM

## 2021-05-16 DIAGNOSIS — E876 Hypokalemia: Secondary | ICD-10-CM

## 2021-05-16 DIAGNOSIS — G7000 Myasthenia gravis without (acute) exacerbation: Secondary | ICD-10-CM

## 2021-05-16 DIAGNOSIS — F0280 Dementia in other diseases classified elsewhere without behavioral disturbance: Secondary | ICD-10-CM

## 2021-05-16 NOTE — Patient Instructions (Signed)
[name],  It was great to talk to you today!  Please call me with any questions or concerns.  Visit Information   PATIENT GOALS:   Goals Addressed             This Visit's Progress    Manage My Medicine       Timeframe:  Long-Range Goal Priority:  High Start Date:   05/16/2021                          Expected End Date: 11/13/2021                      Follow Up Date 08/15/2021   - call for medicine refill 2 or 3 days before it runs out - keep a list of all the medicines I take; vitamins and herbals too - use a pillbox to sort medicine - use an alarm clock or phone to remind me to take my medicine    Why is this important?   These steps will help you keep on track with your medicines.     Track and Manage My Symptoms-Depression       Timeframe:  Long-Range Goal Priority:  High Start Date:   05/16/2021                          Expected End Date: 11/13/2021                      Follow Up Date 08/15/2021   - develop a plan to deal with triggers like holidays, anniversaries - exercise at least 2 to 3 times per week - have a plan for how to handle bad days - spend time or talk with others at least 2 to 3 times per week - watch for early signs of feeling worse    Why is this important?   Keeping track of your progress will help your treatment team find the right mix of medicine and therapy for you.  Write in your journal every day.  Day-to-day changes in depression symptoms are normal. It may be more helpful to check your progress at the end of each week instead of every day.          Consent to CCM Services: [name] was given information about Chronic Care Management services including:  CCM service includes personalized support from designated clinical staff supervised by his physician, including individualized plan of care and coordination with other care providers 24/7 contact phone numbers for assistance for urgent and routine care needs. Service  will only be billed when office clinical staff spend 20 minutes or more in a month to coordinate care. Only one practitioner may furnish and bill the service in a calendar month. The patient may stop CCM services at any time (effective at the end of the month) by phone call to the office staff. The patient will be responsible for cost sharing (co-pay) of up to 20% of the service fee (after annual deductible is met).  Patient agreed to services and verbal consent obtained.   Patient verbalizes understanding of instructions provided today and agrees to view in [place].   Telephone follow up appointment with care management team member scheduled for: 3 months The patient has been provided with contact information for the care management team and has been advised to call with any health related questions or concerns.  Tomasa Blase, PharmD Clinical Pharmacist, [place]    CLINICAL CARE PLAN: Patient Care Plan: CCM Care Plan     Problem Identified: Hypertension, Hyperlipidemia, GERD, Depression, Anxiety, BPH, and Myasthenia Gravis, Alzheimer's Disease      Long-Range Goal: Disease Management   Start Date: 05/16/2021  Expected End Date: 11/13/2021  This Visit's Progress: On track  Priority: High  Note:   Current Barriers:  Unable to independently monitor therapeutic efficacy  Pharmacist Clinical Goal(s):  Patient will achieve adherence to monitoring guidelines and medication adherence to achieve therapeutic efficacy through collaboration with PharmD and provider.   Interventions: 1:1 collaboration with [name], [name], MD regarding development and update of comprehensive plan of care as evidenced by provider attestation and co-signature Inter-disciplinary care team collaboration (see longitudinal plan of care) Comprehensive medication review performed; medication list updated in electronic medical record   Hypertension (BP goal <140/90) -Controlled -Current  treatment: Furosemide 67m - 1-2 tablet daily  Potassium Chloride 147m - 1 tablet twice daily  Lab Results  Component Value Date   K 4.1 03/28/2021  -Medications previously tried: n/a  -Current home readings: n/a BP Readings from Last 3 Encounters:  04/04/21 118/80  04/04/21 118/80  02/28/21 (!) 141/72  -Current dietary habits: low sodium diet -Current exercise habits:trying to stay as active as possible  -Denies hypotensive/hypertensive symptoms -Educated on BP goals and benefits of medications for prevention of heart attack, stroke and kidney damage; Daily salt intake goal < 2300 mg; Symptoms of hypotension and importance of maintaining adequate hydration; -Counseled on diet and exercise extensively Recommended to continue current medication   Hyperlipidemia: (LDL goal < 100) -Not ideally controlled Lab Results  Component Value Date   LDLCALC 162 (H) 03/28/2021  -Current treatment: N/a - was last on simvastatin but stopped 2 years ago due to pain in legs  -Medications previously tried: simvastatin, pravastatin   -Current dietary patterns: cognizant of diet and watching intake of high cholesterol foods -Educated on Cholesterol goals;  Benefits of statin for ASCVD risk reduction; Importance of limiting foods high in cholesterol; -Counseled on diet and exercise extensively Recommended retrial of statin medication to prevent CVA - patient and wife will discuss with PCP at next visit   Depression/Anxiety (Goal: Promotion of positive mood / prevention of anxiety attacks) -Controlled -Current treatment: Lorazepam 7m37m 1 tablet twice daily as needed for anxiety  Sertraline 100m80m1 tablet daily  -Medications previously tried/failed: duloxetine, paroxetine, zolpidem -PHQ9-2: 0 -Educated on Benefits of medication for symptom control Benefits of cognitive-behavioral therapy with or without medication -Recommended to continue current medication  Alzheimer's Disease (Goal:  Prevention of disease progression) - followed by [facility] providers -Controlled -Current treatment  Donepezil 10mg39m tablet daily  -Medications previously tried: n/a  -Recommended to continue current medication  Myasthenia Gravis (Goal: Prevention of disease progression / symptom management) - Followed by [facility]  -Controlled -Current treatment  Mycophenolate 500mg 2mtablets twice daily  Pyridostigmine 60mg -73mablet in AM in the PM and 1 tablet at bedtime if needed (does not typically use) Prednisone 10mg - 62m dai2mfter breakfast  -Medications previously tried: n/a  -Recommended to continue current medication  BPH (Goal: Prevention of disease progression / control of urinary symptoms) -Controlled Lab Results  Component Value Date   PSA 0.21 03/28/2021   PSA 0.25 05/23/2019   PSA 0.49 03/01/2012  -Current treatment  Finasteride 5mg -1 ta27mt daily  Tamsulosin 0.4mg - 2 ca37mles  daily  -Medications previously tried: n/a  -Recommended to continue current medication  GERD (Goal: Prevention/ control of acid reflux) -Controlled -Current treatment  Famotidine 56m - 1 tablet daily  -Medications previously tried: n/a  -Counseled on diet and exercise extensively Recommended to continue current medication    Health Maintenance -Vaccine gaps: COVID Booster / Influenza vaccine  -Current therapy:  CoQ10 1091m- 1 tablet daily after breakfast  Vitamin D3 2000units- 1 tablet daily  Vitamin C 50068m 1 tablet daily  Vitmain B12 1000m72m 1 tablet daily  -Educated on Cost vs benefit of each product must be carefully weighed by individual consumer -Patient is satisfied with current therapy and denies issues -Recommended to continue current medication  Patient Goals/Self-Care Activities Patient will:  - take medications as prescribed collaborate with provider on medication access solutions  Follow Up Plan: Telephone follow up appointment with care  management team member scheduled for: 3 months The patient has been provided with contact information for the care management team and has been advised to call with any health related questions or concerns.

## 2021-05-16 NOTE — Progress Notes (Addendum)
Chronic Care Management Pharmacy Note  05/16/2021 Name:  [name] MRN:  [id number] DOB:  [birth year]  Summary: - Spoke with patient and his wife [name] who helps manage his medications  -Reports that patient has been doing well recently, is working with [facility] providers to keep control of MG - slowly weaning down from prednisone dosing  -Reports that lorazepam has been helpful for anxiety, notes that has only had to use on rare occasion, feels depression is well controlled as well -BPH controlled, urinary symptoms are well managed -Taking furosemide and potassium without issue, most recent potassium level in range, BP with most recent office visits in range  Recommendations/Changes made from today's visit: -Patient reports he is doing well, recommending no changes in medications at this time, discussed possible retrial of statin due to previous CVA, previously had stopped about 2 years ago due to myalgias, unable to recall if pain levels improved since stopping  -Patient plans to discuss retrial of statin with next PCP appointment in ~6 weeks    Subjective: [name] is an 80 y.o. year old male who is a primary patient of [name], [name], MD.  The CCM team was consulted for assistance with disease management and care coordination needs.    Engaged with patient by telephone for initial visit in response to provider referral for pharmacy case management and/or care coordination services.   Consent to Services:  The patient was given the following information about Chronic Care Management services today, agreed to services, and gave verbal consent: 1. CCM service includes personalized support from designated clinical staff supervised by the primary care provider, including individualized plan of care and coordination with other care providers 2. 24/7 contact phone numbers for assistance for urgent and routine care needs. 3. Service will only be billed when  office clinical staff spend 20 minutes or more in a month to coordinate care. 4. Only one practitioner may furnish and bill the service in a calendar month. 5.The patient may stop CCM services at any time (effective at the end of the month) by phone call to the office staff. 6. The patient will be responsible for cost sharing (co-pay) of up to 20% of the service fee (after annual deductible is met). Patient agreed to services and consent obtained.  Patient Care Team: [name], [name], MD as PCP - General Calvert Cantor, MD (Ophthalmology) Drema Halon, MD as Referring Physician (Hematology) Estill Batten, MD as Referring Physician (Neurology) Werner Lean Martinique Lee, MD as Referring Physician (Neurology) Delice Bison Darnelle Maffucci, [facility] as Pharmacist (Pharmacist)  Recent office visits:  04/04/21 Lew Dawes MD PCP- pt was seen for Anxiety and prostatitis. Labs were ordered and pt was started on Doxycycline 100 mg and Lorazepam 1 mg. Follow up in 3 months.   12/03/20 Lew Dawes MD PCP - pt seen for f/u - no changes    Recent consult visits:  02/18/21 John Giovanni RN - [facility] - follow up for myasthenia gravis - continue prednisone 10 mg daily   02/13/21 Ward Givens NP Neurology- pt was seen for OSA on CPAP. Follow up 6 months or prn   01/01/21 John Giovanni RN - [facility] -follow up for myasthenia gravis - continue pyridostigmine   12/14/20 John Giovanni RN - [facility] -follow up for myasthenia gravis   12/10/20 John Giovanni RN - [facility] -follow up for myasthenia gravis   10/12/20 Jaquita Folds MD - [facility] - follow up for alzheimer's -  pt not eligible for aducanumab trial, continue aricept 68m daily   Hospital visits:  Medication Reconciliation was completed by comparing discharge summary, patient's EMR and Pharmacy list, and upon discussion with patient.   Admitted to the hospital on 02/28/21 due to Chest pain. Discharge date  was 02/28/21. [facility]   Medication Changes at Hospital Discharge: -Changed none   Medications Discontinued at Hospital Discharge: -Stopped none   Medications that remain the same after Hospital Discharge:??  -All other medications will remain the same  Objective:  Lab Results  Component Value Date   CREATININE 0.79 03/28/2021   BUN 19 03/28/2021   GFR 84.18 03/28/2021   GFRNONAA >60 02/28/2021   GFRAA >60 04/23/2020   NA 143 03/28/2021   K 4.1 03/28/2021   CALCIUM 9.5 03/28/2021   CO2 27 03/28/2021   GLUCOSE 84 03/28/2021    Lab Results  Component Value Date/Time   HGBA1C 5.5 02/05/2018 12:05 PM   HGBA1C 4.6 (L) 12/01/2017 05:12 AM   GFR 84.18 03/28/2021 10:29 AM   GFR 83.49 10/18/2020 11:02 AM    Last diabetic Eye exam:  No results found for: HMDIABEYEEXA  Last diabetic Foot exam:  No results found for: HMDIABFOOTEX   Lab Results  Component Value Date   CHOL 246 (H) 03/28/2021   HDL 62.80 03/28/2021   LDLCALC 162 (H) 03/28/2021   LDLDIRECT 157.5 07/05/2008   TRIG 107.0 03/28/2021   CHOLHDL 4 03/28/2021    Hepatic Function Latest Ref Rng & Units 03/28/2021 09/04/2020 01/13/2019  Total Protein 6.0 - 8.3 g/dL 6.2 6.1(L) 6.1  Albumin 3.5 - 5.2 g/dL 4.1 3.3(L) 4.0  AST 0 - 37 U/L _0 ALT 0 - 53 U/L _1 Alk Phosphatase 39 - 117 U/L 49 43 41  Total Bilirubin 0.2 - 1.2 mg/dL 0.8 1.1 0.5  Bilirubin, Direct 0.0 - 0.3 mg/dL - - 0.1    Lab Results  Component Value Date/Time   TSH 1.06 03/28/2021 10:29 AM   TSH 1.17 05/23/2019 09:03 AM   FREET4 1.27 (H) 12/03/2017 02:30 PM    CBC Latest Ref Rng & Units 03/28/2021 02/28/2021 10/18/2020  WBC 4.0 - 10.5 K/uL 5.6 5.8 7.7  Hemoglobin 13.0 - 17.0 g/dL 13.7 14.4 14.0  Hematocrit 39.0 - 52.0 % 42.0 44.8 42.5  Platelets 150.0 - 400.0 K/uL 167.0 172 213.0    Lab Results  Component Value Date/Time   VD25OH 40.97  05/23/2019 09:03 AM    Clinical ASCVD: Yes  The ASCVD Risk score (Arnett DK, et al., 2019) failed to calculate for the following reasons:   The 2019 ASCVD risk score is only valid for ages 418to 742  The patient has a prior MI or stroke diagnosis    [facility] 05/16/2021 04/04/2021 04/04/2021  Decreased Interest 0 0 0  Down, Depressed, Hopeless 0 0 0  PHQ - 2 Score 0 0 0  Altered sleeping - 0 -  Tired, decreased energy - 0 -  Change in appetite - 0 -  Feeling bad or failure about yourself  - 0 -  Trouble concentrating - 0 -  Moving slowly or fidgety/restless - 0 -  Suicidal thoughts - 0 -  PHQ-9 Score - 0 -  Some recent data might be hidden    Social History   Tobacco Use  Smoking Status Former   Types: Cigarettes  Quit date: 01/08/1987   Years since quitting: 34.3  Smokeless Tobacco Never   BP Readings from Last 3 Encounters:  04/04/21 118/80  04/04/21 118/80  02/28/21 (!) 141/72   Pulse Readings from Last 3 Encounters:  04/04/21 60  04/04/21 60  02/28/21 (!) 51   Wt Readings from Last 3 Encounters:  04/04/21 240 lb 9.6 oz (109.1 kg)  04/04/21 240 lb 9.6 oz (109.1 kg)  02/28/21 245 lb (111.1 kg)   BMI Readings from Last 3 Encounters:  04/04/21 32.63 kg/m  04/04/21 32.63 kg/m  02/28/21 33.23 kg/m    Assessment/Interventions: Review of patient past medical history, allergies, medications, health status, including review of consultants reports, laboratory and other test data, was performed as part of comprehensive evaluation and provision of chronic care management services.   SDOH:  (Social Determinants of Health) assessments and interventions performed: Yes  SDOH Screenings   Alcohol Screen: Low Risk    Last Alcohol Screening Score (AUDIT): 1  Depression (PHQ2-9): Low Risk    PHQ-2 Score: 0  Financial Resource Strain: Low Risk    Difficulty of Paying Living Expenses: Not hard at all  Food Insecurity: No Food Insecurity   Worried About  Charity fundraiser in the Last Year: Never true   Ran Out of Food in the Last Year: Never true  Housing: Low Risk    Last Housing Risk Score: 0  Physical Activity: Sufficiently Active   Days of Exercise per Week: 5 days   Minutes of Exercise per Session: 30 min  Social Connections: Engineer, building services of Communication with Friends and Family: More than three times a week   Frequency of Social Gatherings with Friends and Family: More than three times a week   Attends Religious Services: More than 4 times per year   Active Member of [employer] or Organizations: Yes   Attends Music therapist: More than 4 times per year   Marital Status: Married  Stress: No Stress Concern Present   Feeling of Stress : Not at all  Tobacco Use: Medium Risk   Smoking Tobacco Use: Former   Smokeless Tobacco Use: Never  Transportation Needs: No Data processing manager (Medical): No   Lack of Transportation (Non-Medical): No    CCM Care Plan  Allergies  Allergen Reactions   Ferumoxytol Other (See Comments)    Stroke like symptoms; has hx of MG as well     Medications Reviewed Today     Reviewed by Tomasa Blase, [facility] (Pharmacist) on 05/16/21 at 1427  Med List Status: <None>   Medication Order Taking? Sig Documenting Provider Last Dose Status Informant  Cholecalciferol (VITAMIN D3) 2000 units TABS [id number] Yes Take 2,000 Units by mouth at bedtime. [provider] Taking Active Spouse/Significant Other  Coenzyme Q10 (CO Q-10 PO) [id number] Yes Take 1 tablet by mouth at bedtime. [provider] Taking Active Spouse/Significant Other  Cyanocobalamin (VITAMIN B-12) 1000 MCG SUBL [id number] Yes Place 1 tablet (1,000 mcg total) under the tongue daily.  Patient taking differently: Place 1,000 mcg under the tongue at bedtime.   [name], [name], MD Taking Active   donepezil (ARICEPT) 10 MG tablet [id number] Yes TAKE 1 TABLET BY MOUTH EVERYDAY AT  BEDTIME [name], [name], MD Taking Active   famotidine (PEPCID) 40 MG tablet [id number] Yes TAKE 1 TABLET BY MOUTH EVERY DAY [name], [name], MD Taking Active   finasteride (PROSCAR) 5 MG tablet [id number] Yes TAKE 1  TABLET BY MOUTH  DAILY [name], [name], MD Taking Active   furosemide (LASIX) 20 MG tablet [id number] Yes TAKE 1 TO 2 TABLETS BY  MOUTH DAILY IN THE EVENING  Patient taking differently: Take 20 mg by mouth daily. TAKE 1 TO 2 TABLETS BY  MOUTH DAILY IN THE EVENING   [name], [name], MD Taking Active   LORazepam (ATIVAN) 1 MG tablet [id number] Yes Take 1 tablet (1 mg total) by mouth 2 (two) times daily as needed for anxiety.  Patient taking differently: Take 1 mg by mouth daily as needed for anxiety.   [name], [name], MD Taking Active   mycophenolate (CELLCEPT) 500 MG tablet [id number] Yes Take 1,000 mg by mouth 2 (two) times daily. [provider] Taking Active   potassium chloride (KLOR-CON) 10 MEQ tablet [id number] Yes Take 1 tablet (10 mEq total) by mouth 2 (two) times daily. [name], [name], MD Taking Active   predniSONE (DELTASONE) 10 MG tablet [id number] Yes 25 mg qod 20 mg qod  Patient taking differently: Take 10 mg by mouth daily. 49m [facility] breakfast daily [facility]   [name], AEvie Lacks MD Taking Active   pyridostigmine (MESTINON) 60 MG tablet [id number] One in AM , one at lunch, 2 at bedtime.  Patient taking differently: One in AM , one in PM, and 1 at bedtime if he need   Dohmeier, CAsencion Partridge MD Taking Active   sertraline (ZOLOFT) 100 MG tablet [id number] TAKE 1 TABLET BY MOUTH EVERY DAY [name], AEvie Lacks MD Taking Active   tamsulosin (FLOMAX) 0.4 MG CAPS capsule [id number] Take 2 capsules by mouth every day [name], AEvie Lacks MD Taking Active   vitamin C (ASCORBIC ACID) 500 MG tablet [id number] Take 500 mg by mouth daily. [provider] Taking Active             Patient Active Problem List    Diagnosis Date Noted   Anxiety 04/04/2021   Prostatitis 04/04/2021   A[place]rosclerosis of aorta ([place] 12/10/2020   Alzheimer disease ([place] 11/29/2019   Depressed mood 05/23/2019   GERD (gastroesophageal reflux disease) 09/09/2018   Chronic coughing 06/07/2018   Hypokalemia 04/16/2018   History of plasmapheresis 04/12/2018   MCI (mild cognitive impairment) 04/12/2018   Myasthenia exacerbation ([place] 04/12/2018   Weakness of both lower extremities 01/13/2018   Myasthenia gravis, AChR antibody positive ([place] 01/13/2018   Dysarthria 12/04/2017   Dysphagia    CVA (cerebral vascular accident) ([place] 11/30/2017   Angio-edema 11/26/2017   S/P knee replacement 09/25/2017   Knee pain, chronic 08/14/2017   OSA on CPAP 03/05/2017   OSA (obstructive sleep apnea) 09/30/2016   S/P shoulder replacement, left 07/04/2016   Preop exam for internal medicine 04/16/2016   Claudication ([place] 09/14/2015   Pain in the chest    Abnormal stress test 08/14/2015   Dyspnea 07/23/2015   Paresthesia 10/25/2014   Weakness 04/13/2014   Heme positive stool 02/22/2014   Benign prostatic hyperplasia 02/22/2014   B12 deficiency 02/21/2014   Anemia, iron deficiency 02/21/2014   Leg weakness, bilateral 01/24/2014   Erectile dysfunction 01/24/2014   Edema 01/24/2014   URI, acute 08/31/2013   Cellulitis of arm, right 07/13/2013   Grief 07/13/2013   Well adult exam 03/01/2012   CARPAL TUNNEL SYNDROME 11/04/2010   BENIGN PROSTATIC HYPERTROPHY, WITH URINARY OBSTRUCTION 11/04/2010   WRIST PAIN 11/04/2010   LEG PAIN 07/16/2010   COUGH 05/20/2010   TOBACCO USE, QUIT 07/04/2009   WARTS,  VIRAL, UNSPECIFIED 11/06/2008   INSOMNIA, PERSISTENT 01/14/2008   Essential hypertension 01/14/2008   RHINITIS 01/14/2008   ABDOMINAL PAIN 01/14/2008   TREMOR 09/07/2007   Actinic keratosis 08/12/2007   Neoplasm of uncertain behavior of skin 07/06/2007   LOW BACK PAIN 07/06/2007   Dyslipidemia 06/05/2007   Coronary  atherosclerosis 06/05/2007   Osteoarthritis 06/05/2007    Immunization History  Administered Date(s) Administered   H1N1 08/02/2008   Influenza Whole 06/10/2006, 06/15/2008, 05/22/2009, 05/25/2010, 05/26/2011, 04/25/2012   Influenza, High Dose Seasonal PF 04/15/2017, 05/09/2019, 04/22/2020   Influenza,inj,Quad PF,6+ Mos 05/23/2013, 04/13/2014, 05/23/2015, 04/16/2016   PFIZER(Purple Top)SARS-COV-2 Vaccination 09/02/2019, 09/23/2019, 04/29/2020   Pneumococcal Conjugate-13 07/10/2014   Pneumococcal Polysaccharide-23 06/25/2006, 12/30/2016, 04/09/2020   Td 07/04/2009   Tdap 06/04/2013   Zoster Recombinat (Shingrix) 12/28/2016, 05/09/2017   Zoster, Live 12/24/2006    Conditions to be addressed/monitored:  Hypertension, Hyperlipidemia, GERD, Depression, Anxiety, BPH, and Myasthenia Gravis, Alzheimer's Disease   Care Plan : CCM Care Plan  Updates made by Tomasa Blase, RPH since 05/16/2021 12:00 AM     Problem: Hypertension, Hyperlipidemia, GERD, Depression, Anxiety, BPH, and Myasthenia Gravis, Alzheimer's Disease      Long-Range Goal: Disease Management   Start Date: 05/16/2021  Expected End Date: 11/13/2021  This Visit's Progress: On track  Priority: High  Note:   Current Barriers:  Unable to independently monitor therapeutic efficacy  Pharmacist Clinical Goal(s):  Patient will achieve adherence to monitoring guidelines and medication adherence to achieve therapeutic efficacy through collaboration with PharmD and provider.   Interventions: 1:1 collaboration with [name], [name], MD regarding development and update of comprehensive plan of care as evidenced by provider attestation and co-signature Inter-disciplinary care team collaboration (see longitudinal plan of care) Comprehensive medication review performed; medication list updated in electronic medical record   Hypertension (BP goal <140/90) -Controlled -Current treatment: Furosemide 24m - 1-2 tablet daily   Potassium Chloride 174m - 1 tablet twice daily  Lab Results  Component Value Date   K 4.1 03/28/2021  -Medications previously tried: n/a  -Current home readings: n/a BP Readings from Last 3 Encounters:  04/04/21 118/80  04/04/21 118/80  02/28/21 (!) 141/72  -Current dietary habits: low sodium diet -Current exercise habits:trying to stay as active as possible  -Denies hypotensive/hypertensive symptoms -Educated on BP goals and benefits of medications for prevention of heart attack, stroke and kidney damage; Daily salt intake goal < 2300 mg; Symptoms of hypotension and importance of maintaining adequate hydration; -Counseled on diet and exercise extensively Recommended to continue current medication   Hyperlipidemia: (LDL goal < 100) -Not ideally controlled Lab Results  Component Value Date   LDLCALC 162 (H) 03/28/2021  -Current treatment: N/a - was last on simvastatin but stopped 2 years ago due to pain in legs  -Medications previously tried: simvastatin, pravastatin   -Current dietary patterns: cognizant of diet and watching intake of high cholesterol foods -Educated on Cholesterol goals;  Benefits of statin for ASCVD risk reduction; Importance of limiting foods high in cholesterol; -Counseled on diet and exercise extensively Recommended retrial of statin medication to prevent CVA - patient and wife will discuss with PCP at next visit   Depression/Anxiety (Goal: Promotion of positive mood / prevention of anxiety attacks) -Controlled -Current treatment: Lorazepam 52m84m 1 tablet twice daily as needed for anxiety  Sertraline 100m38m1 tablet daily  -Medications previously tried/failed: duloxetine, paroxetine, zolpidem -PHQ9-2: 0 -Educated on Benefits of medication for symptom control Benefits of cognitive-behavioral therapy with or without  medication -Recommended to continue current medication  Alzheimer's Disease (Goal: Prevention of disease progression) - followed by  [facility] providers -Controlled -Current treatment  Donepezil 32m - 1 tablet daily  -Medications previously tried: n/a  -Recommended to continue current medication  Myasthenia Gravis (Goal: Prevention of disease progression / symptom management) - Followed by [facility]  -Controlled -Current treatment  Mycophenolate 5058m- 2 tablets twice daily  Pyridostigmine 6020m 1 tablet in AM in the PM and 1 tablet at bedtime if needed (does not typically use) Prednisone 69m95m69mg35mly after breakfast  -Medications previously tried: n/a  -Recommended to continue current medication  BPH (Goal: Prevention of disease progression / control of urinary symptoms) -Controlled Lab Results  Component Value Date   PSA 0.21 03/28/2021   PSA 0.25 05/23/2019   PSA 0.49 03/01/2012  -Current treatment  Finasteride 5mg -74mablet daily  Tamsulosin 0.4mg - 75mapsules daily  -Medications previously tried: n/a  -Recommended to continue current medication  GERD (Goal: Prevention/ control of acid reflux) -Controlled -Current treatment  Famotidine 40mg - 41mblet daily  -Medications previously tried: n/a  -Counseled on diet and exercise extensively Recommended to continue current medication    Health Maintenance -Vaccine gaps: COVID Booster / Influenza vaccine  -Current therapy:  CoQ10 100mg - 126mlet daily after breakfast  Vitamin D3 2000units- 1 tablet daily  Vitamin C 500mg - 1 69met daily  Vitmain B12 1000mcg - 1 101met daily  -Educated on Cost vs benefit of each product must be carefully weighed by individual consumer -Patient is satisfied with current therapy and denies issues -Recommended to continue current medication  Patient Goals/Self-Care Activities Patient will:  - take medications as prescribed collaborate with provider on medication access solutions  Follow Up Plan: Telephone follow up appointment with care management team member scheduled for: 3 months The  patient has been provided with contact information for the care management team and has been advised to call with any health related questions or concerns.       Medication Assistance: None required.  Patient affirms current coverage meets needs.   Patient's preferred pharmacy is:  [id number] [id number], [place] - [address]:[phone] box? Yes Pt endorses 100% compliance  Care Plan and Follow Up Patient Decision:  Patient agrees to Care Plan and Follow-up.  Plan: Telephone follow up appointment with care management team member scheduled for:  3 months  and The patient has been provided with contact information for the care management team and has been advised to call with any health related questions or concerns.    C SzaTomasa Blasenical Pharmacist, [place] Gree[place]ellenxamination/treatment/procedure(s) were performed by non-physician practitioner and as supervising physician I was immediately available for consultation/collaboration.  I agree with above. Aleksei PlotLew Dawes

## 2021-05-23 ENCOUNTER — Other Ambulatory Visit: Payer: Self-pay | Admitting: Internal Medicine

## 2021-05-23 DIAGNOSIS — G7000 Myasthenia gravis without (acute) exacerbation: Secondary | ICD-10-CM | POA: Diagnosis not present

## 2021-05-23 DIAGNOSIS — Z5181 Encounter for therapeutic drug level monitoring: Secondary | ICD-10-CM | POA: Diagnosis not present

## 2021-05-24 DIAGNOSIS — I10 Essential (primary) hypertension: Secondary | ICD-10-CM

## 2021-05-24 DIAGNOSIS — I639 Cerebral infarction, unspecified: Secondary | ICD-10-CM

## 2021-05-24 DIAGNOSIS — F0280 Dementia in other diseases classified elsewhere without behavioral disturbance: Secondary | ICD-10-CM | POA: Diagnosis not present

## 2021-05-24 DIAGNOSIS — G309 Alzheimer's disease, unspecified: Secondary | ICD-10-CM | POA: Diagnosis not present

## 2021-07-08 ENCOUNTER — Ambulatory Visit (INDEPENDENT_AMBULATORY_CARE_PROVIDER_SITE_OTHER): Payer: Medicare Other | Admitting: Internal Medicine

## 2021-07-08 ENCOUNTER — Encounter: Payer: Self-pay | Admitting: Internal Medicine

## 2021-07-08 ENCOUNTER — Other Ambulatory Visit: Payer: Self-pay

## 2021-07-08 DIAGNOSIS — G309 Alzheimer's disease, unspecified: Secondary | ICD-10-CM | POA: Diagnosis not present

## 2021-07-08 DIAGNOSIS — D500 Iron deficiency anemia secondary to blood loss (chronic): Secondary | ICD-10-CM

## 2021-07-08 DIAGNOSIS — G7000 Myasthenia gravis without (acute) exacerbation: Secondary | ICD-10-CM

## 2021-07-08 DIAGNOSIS — M164 Bilateral post-traumatic osteoarthritis of hip: Secondary | ICD-10-CM | POA: Diagnosis not present

## 2021-07-08 DIAGNOSIS — F0280 Dementia in other diseases classified elsewhere without behavioral disturbance: Secondary | ICD-10-CM

## 2021-07-08 DIAGNOSIS — E538 Deficiency of other specified B group vitamins: Secondary | ICD-10-CM | POA: Diagnosis not present

## 2021-07-08 MED ORDER — MEMANTINE HCL 10 MG PO TABS: 10.0000 mg | ORAL_TABLET | Freq: Two times a day (BID) | ORAL | 1 refills | Status: DC

## 2021-07-08 MED ORDER — MEMANTINE HCL 10 MG PO TABS: 10.0000 mg | ORAL_TABLET | Freq: Two times a day (BID) | ORAL | 3 refills | Status: DC

## 2021-07-08 NOTE — Assessment & Plan Note (Signed)
On B12 

## 2021-07-08 NOTE — Assessment & Plan Note (Signed)
Worse on less Prednisone Increase Prednisone to 10 mg qod, 5 mg qod

## 2021-07-08 NOTE — Assessment & Plan Note (Signed)
Reducing  Prednisone - on 10 mg qod

## 2021-07-08 NOTE — Assessment & Plan Note (Signed)
Will monitor CBC Off ASA Compr socks for travel

## 2021-07-08 NOTE — Telephone Encounter (Signed)
Place in purple folder to complete.Marland KitchenChryl [name]

## 2021-07-08 NOTE — Progress Notes (Signed)
Subjective:  Patient ID: [name], male    DOB: 12-Oct-1940  Age: 80 y.o. MRN: [id number]  CC: No chief complaint on file.   HPI [name] presents for memory problems - worse F/u on MG, anemia Pt had labs 1 month ago C/o more neck pain  Outpatient Medications Prior to Visit  Medication Sig Dispense Refill   Cholecalciferol (VITAMIN D3) 2000 units TABS Take 2,000 Units by mouth at bedtime.     Coenzyme Q10 (CO Q-10 PO) Take 1 tablet by mouth at bedtime.     Cyanocobalamin (VITAMIN B-12) 1000 MCG SUBL Place 1 tablet (1,000 mcg total) under the tongue daily. (Patient taking differently: Place 1,000 mcg under the tongue at bedtime.) 100 tablet 3   donepezil (ARICEPT) 10 MG tablet TAKE 1 TABLET BY MOUTH EVERYDAY AT BEDTIME 90 tablet 2   famotidine (PEPCID) 40 MG tablet Take 1 tablet by mouth at bedtime 30 tablet 11   finasteride (PROSCAR) 5 MG tablet TAKE 1 TABLET BY MOUTH  DAILY 90 tablet 3   furosemide (LASIX) 20 MG tablet TAKE 1 TO 2 TABLETS BY  MOUTH DAILY IN THE EVENING (Patient taking differently: Take 20 mg by mouth daily. TAKE 1 TO 2 TABLETS BY  MOUTH DAILY IN THE EVENING) 270 tablet 1   LORazepam (ATIVAN) 1 MG tablet Take 1 tablet (1 mg total) by mouth 2 (two) times daily as needed for anxiety. (Patient taking differently: Take 1 mg by mouth daily as needed for anxiety.) 60 tablet 1   mycophenolate (CELLCEPT) 500 MG tablet Take 1,000 mg by mouth 2 (two) times daily.     potassium chloride (KLOR-CON) 10 MEQ tablet Take 1 tablet (10 mEq total) by mouth 2 (two) times daily. 180 tablet 3   predniSONE (DELTASONE) 10 MG tablet 25 mg qod 20 mg qod (Patient taking differently: Take 10 mg by mouth daily. 10mg  every other day) 100 tablet 3   pyridostigmine (MESTINON) 60 MG tablet One in AM , one at lunch, 2 at bedtime. (Patient taking differently: One in AM , one in PM, and 1 at bedtime if he need) 450 tablet 3   sertraline (ZOLOFT) 100 MG tablet TAKE 1 TABLET BY MOUTH EVERY  DAY 90 tablet 3   tamsulosin (FLOMAX) 0.4 MG CAPS capsule Take 2 capsules by mouth every day 60 capsule 5   vitamin C (ASCORBIC ACID) 500 MG tablet Take 500 mg by mouth daily.     No facility-administered medications prior to visit.    ROS: Review of Systems  Constitutional:  Negative for appetite change, fatigue and unexpected weight change.  HENT:  Negative for congestion, nosebleeds, sneezing, sore throat and trouble swallowing.   Eyes:  Negative for itching and visual disturbance.  Respiratory:  Negative for cough.   Cardiovascular:  Negative for chest pain, palpitations and leg swelling.  Gastrointestinal:  Negative for abdominal distention, blood in stool, diarrhea and nausea.  Genitourinary:  Negative for frequency and hematuria.  Musculoskeletal:  Negative for back pain, gait problem, joint swelling and neck pain.  Skin:  Negative for rash.  Neurological:  Negative for dizziness, tremors, speech difficulty and weakness.  Psychiatric/Behavioral:  Negative for agitation, dysphoric mood, sleep disturbance and suicidal ideas. The patient is not nervous/anxious.    Objective:  BP 122/60 (BP Location: Left Arm)   Pulse 64   Temp 97.9 F (36.6 C) (Oral)   Ht 6' (1.829 m)   Wt 242 lb 6.4 oz (110 kg)  SpO2 96%   BMI 32.88 kg/m   BP Readings from Last 3 Encounters:  07/08/21 122/60  04/04/21 118/80  04/04/21 118/80    Wt Readings from Last 3 Encounters:  07/08/21 242 lb 6.4 oz (110 kg)  04/04/21 240 lb 9.6 oz (109.1 kg)  04/04/21 240 lb 9.6 oz (109.1 kg)    Physical Exam Constitutional:      General: He is not in acute distress.    Appearance: He is well-developed.     Comments: NAD  Eyes:     Conjunctiva/sclera: Conjunctivae normal.     Pupils: Pupils are equal, round, and reactive to light.  Neck:     Thyroid: No thyromegaly.     Vascular: No JVD.  Cardiovascular:     Rate and Rhythm: Normal rate and regular rhythm.     Heart sounds: Normal heart sounds. No  murmur heard.   No friction rub. No gallop.  Pulmonary:     Effort: Pulmonary effort is normal. No respiratory distress.     Breath sounds: Normal breath sounds. No wheezing or rales.  Chest:     Chest wall: No tenderness.  Abdominal:     General: Bowel sounds are normal. There is no distension.     Palpations: Abdomen is soft. There is no mass.     Tenderness: no abdominal tenderness There is no guarding or rebound.  Musculoskeletal:        General: No tenderness. Normal range of motion.     Cervical back: Normal range of motion.  Lymphadenopathy:     Cervical: No cervical adenopathy.  Skin:    General: Skin is warm and dry.     Findings: No rash.  Neurological:     Mental Status: He is alert and oriented to person, place, and time.     Cranial Nerves: No cranial nerve deficit.     Motor: No abnormal muscle tone.     Coordination: Coordination normal.     Gait: Gait normal.     Deep Tendon Reflexes: Reflexes are normal and symmetric.  Psychiatric:        Behavior: Behavior normal.        Thought Content: Thought content normal.        Judgment: Judgment normal.    Lab Results  Component Value Date   WBC 5.6 03/28/2021   HGB 13.7 03/28/2021   HCT 42.0 03/28/2021   PLT 167.0 03/28/2021   GLUCOSE 84 03/28/2021   CHOL 246 (H) 03/28/2021   TRIG 107.0 03/28/2021   HDL 62.80 03/28/2021   LDLDIRECT 157.5 07/05/2008   LDLCALC 162 (H) 03/28/2021   ALT 10 03/28/2021   AST 11 03/28/2021   NA 143 03/28/2021   K 4.1 03/28/2021   CL 106 03/28/2021   CREATININE 0.79 03/28/2021   BUN 19 03/28/2021   CO2 27 03/28/2021   TSH 1.06 03/28/2021   PSA 0.21 03/28/2021   INR 0.99 11/30/2017   HGBA1C 5.5 02/05/2018    DG Chest 2 View  Result Date: 02/28/2021 CLINICAL DATA:  Chest pain, shortness of breath EXAM: CHEST - 2 VIEW COMPARISON:  09/04/2020 FINDINGS: Chronic elevation of the left hemidiaphragm. Heart size within normal limits. Streaky left basilar opacity, likely  atelectasis. Lungs are otherwise clear. No pleural effusion or pneumothorax. Degenerative changes of the thoracic spine. Reverse left shoulder arthroplasty. IMPRESSION: Chronic elevation of the left hemidiaphragm with streaky left basilar opacity, likely atelectasis. Electronically Signed   By: Davina Poke D.O.  On: 02/28/2021 10:46    Assessment & Plan:   Problem List Items Addressed This Visit     Alzheimer disease ([place])    Worse Cont on Aricept. Added Namenda       Relevant Medications   memantine (NAMENDA) 10 MG tablet   Anemia, iron deficiency    Will monitor CBC Off ASA Compr socks for travel      B12 deficiency    On B12      Myasthenia gravis, AChR antibody positive (HCC)    Reducing  Prednisone - on 10 mg qod      Relevant Medications   memantine (NAMENDA) 10 MG tablet   Osteoarthritis    Worse on less Prednisone Increase Prednisone to 10 mg qod, 5 mg qod           Meds ordered this encounter  Medications   DISCONTD: memantine (NAMENDA) 10 MG tablet    Sig: Take 1 tablet (10 mg total) by mouth 2 (two) times daily.    Dispense:  60 tablet    Refill:  1   memantine (NAMENDA) 10 MG tablet    Sig: Take 1 tablet (10 mg total) by mouth 2 (two) times daily.    Dispense:  180 tablet    Refill:  3      Follow-up: Return in about 3 months (around 10/08/2021) for a follow-up visit.  [name], MD

## 2021-07-08 NOTE — Assessment & Plan Note (Signed)
Worse Cont on Aricept. Added Namenda

## 2021-08-15 ENCOUNTER — Ambulatory Visit (INDEPENDENT_AMBULATORY_CARE_PROVIDER_SITE_OTHER): Payer: Medicare Other

## 2021-08-15 DIAGNOSIS — I10 Essential (primary) hypertension: Secondary | ICD-10-CM

## 2021-08-15 DIAGNOSIS — I2510 Atherosclerotic heart disease of native coronary artery without angina pectoris: Secondary | ICD-10-CM

## 2021-08-15 NOTE — Patient Instructions (Signed)
Visit Information  Following are the goals we discussed today:   Manage My Medications   Timeframe:  Long-Range Goal Priority:  High Start Date:   05/16/2021                          Expected End Date: 08/15/2022                    Follow Up Date 12/14/2021   - call for medicine refill 2 or 3 days before it runs out - keep a list of all the medicines I take; vitamins and herbals too - use a pillbox to sort medicine - use an alarm clock or phone to remind me to take my medicine    Why is this important?   These steps will help you keep on track with your medicines.  Plan: Telephone follow up appointment with care management team member scheduled for:  4 months The patient has been provided with contact information for the care management team and has been advised to call with any health related questions or concerns.   Tomasa Blase, PharmD Clinical Pharmacist, [facility]   Please call the care guide team at [phone] if you need to cancel or reschedule your appointment.   Patient verbalizes understanding of instructions provided today and agrees to view in [place].

## 2021-08-15 NOTE — Progress Notes (Addendum)
Chronic Care Management Pharmacy Note  08/15/2021 Name:  [name] MRN:  [id number] DOB:  [birth year]  Summary: - Spoke with patient and his wife [name] who helps manage his medications  -Notes that since last appointment, memantine has been started and prednisone dosage has been adjusted  -BP well controlled in office, no current issues or concerns  -Reports that no changes have been seen since starting memantine - dosage increased to BID dosing about 1 week ago  -Patient doing better with adjustment in prednisone dosing (78m/5mg daily alternating dose)  Recommendations/Changes made from today's visit: -Recommending no changes to medications at this time, to reach out with any issues or concerns  -Would recommend for discussion of statin medication retrial with next PCP appointment   Subjective: Lee JANUSZEWSKIis an 80y.o. year old male who is a primary patient of [name], AEvie Lacks MD.  The CCM team was consulted for assistance with disease management and care coordination needs.    Engaged with patient by telephone for follow up visit in response to provider referral for pharmacy case management and/or care coordination services.   Consent to Services:  The patient was given the following information about Chronic Care Management services today, agreed to services, and gave verbal consent: 1. CCM service includes personalized support from designated clinical staff supervised by the primary care provider, including individualized plan of care and coordination with other care providers 2. 24/7 contact phone numbers for assistance for urgent and routine care needs. 3. Service will only be billed when office clinical staff spend 20 minutes or more in a month to coordinate care. 4. Only one practitioner may furnish and bill the service in a calendar month. 5.The patient may stop CCM services at any time (effective at the end of the month) by phone call to the office staff. 6.  The patient will be responsible for cost sharing (co-pay) of up to 20% of the service fee (after annual deductible is met). Patient agreed to services and consent obtained.  Patient Care Team: [name], AEvie Lacks MD as PCP - General DCalvert Cantor MD (Ophthalmology) ADrema Halon MD as Referring Physician (Hematology) LEstill Batten MD as Referring Physician (Neurology) MWerner Lean JMartiniqueLee, MD as Referring Physician (Neurology) STomasa Blase RJohn C. [facility] Hospitalas Pharmacist (Pharmacist)  Recent office visits:  07/08/2021 - Dr. PAlain Marion- follow up for worsening memory problems - memantine 128mdaily added - prednisone dosing alternating 1030mmg qod    Recent consult visits:  [facility]2021 [facility] prednisone reduced to 52m109md   Hospital visits:  None since last visit   Objective:  Lab Results  Component Value Date   CREATININE 0.79 03/28/2021   BUN 19 03/28/2021   GFR 84.18 03/28/2021   GFRNONAA >60 02/28/2021   GFRAA >60 04/23/2020   NA 143 03/28/2021   K 4.1 03/28/2021   CALCIUM 9.5 03/28/2021   CO2 27 03/28/2021   GLUCOSE 84 03/28/2021    Lab Results  Component Value Date/Time   HGBA1C 5.5 02/05/2018 12:05 PM   HGBA1C 4.6 (L) 12/01/2017 05:12 AM   GFR 84.18 03/28/2021 10:29 AM   GFR 83.49 10/18/2020 11:02 AM    Last diabetic Eye exam:  No results found for: HMDIABEYEEXA  Last diabetic Foot exam:  No results found for: HMDIABFOOTEX   Lab Results  Component Value Date   CHOL 246 (H) 03/28/2021   HDL 62.80 03/28/2021   LDLCALC 162 (H) 03/28/2021  LDLDIRECT 157.5 07/05/2008   TRIG 107.0 03/28/2021   CHOLHDL 4 03/28/2021    Hepatic Function Latest Ref Rng & Units 03/28/2021 09/04/2020 01/13/2019  Total Protein 6.0 - 8.3 g/dL 6.2 6.1(L) 6.1  Albumin 3.5 - 5.2 g/dL 4.1 3.3(L) 4.0  AST 0 - 37 U/L '11 29 11  ' ALT 0 - 53 U/L '10 22 19  ' Alk Phosphatase 39 - 117 U/L 49 43 41  Total Bilirubin 0.2 - 1.2 mg/dL 0.8 1.1 0.5  Bilirubin, Direct  0.0 - 0.3 mg/dL - - 0.1    Lab Results  Component Value Date/Time   TSH 1.06 03/28/2021 10:29 AM   TSH 1.17 05/23/2019 09:03 AM   FREET4 1.27 (H) 12/03/2017 02:30 PM    CBC Latest Ref Rng & Units 03/28/2021 02/28/2021 10/18/2020  WBC 4.0 - 10.5 K/uL 5.6 5.8 7.7  Hemoglobin 13.0 - 17.0 g/dL 13.7 14.4 14.0  Hematocrit 39.0 - 52.0 % 42.0 44.8 42.5  Platelets 150.0 - 400.0 K/uL 167.0 172 213.0    Lab Results  Component Value Date/Time   VD25OH 40.97 05/23/2019 09:03 AM    Clinical ASCVD: Yes  The ASCVD Risk score (Arnett DK, et al., 2019) failed to calculate for the following reasons:   The 2019 ASCVD risk score is only valid for ages 21 to 27   The patient has a prior MI or stroke diagnosis    Depression screen [facility] 2/9 05/16/2021 04/04/2021 04/04/2021  Decreased Interest 0 0 0  Down, Depressed, Hopeless 0 0 0  PHQ - 2 Score 0 0 0  Altered sleeping - 0 -  Tired, decreased energy - 0 -  Change in appetite - 0 -  Feeling bad or failure about yourself  - 0 -  Trouble concentrating - 0 -  Moving slowly or fidgety/restless - 0 -  Suicidal thoughts - 0 -  PHQ-9 Score - 0 -  Some recent data might be hidden    Social History   Tobacco Use  Smoking Status Former   Types: Cigarettes   Quit date: 01/08/1987   Years since quitting: 34.6  Smokeless Tobacco Never   BP Readings from Last 3 Encounters:  07/08/21 122/60  04/04/21 118/80  04/04/21 118/80   Pulse Readings from Last 3 Encounters:  07/08/21 64  04/04/21 60  04/04/21 60   Wt Readings from Last 3 Encounters:  07/08/21 242 lb 6.4 oz (110 kg)  04/04/21 240 lb 9.6 oz (109.1 kg)  04/04/21 240 lb 9.6 oz (109.1 kg)   BMI Readings from Last 3 Encounters:  07/08/21 32.88 kg/m  04/04/21 32.63 kg/m  04/04/21 32.63 kg/m    Assessment/Interventions: Review of patient past medical history, allergies, medications, health status, including review of consultants reports, laboratory and other test data, was performed as part  of comprehensive evaluation and provision of chronic care management services.   SDOH:  (Social Determinants of Health) assessments and interventions performed: Yes  SDOH Screenings   Alcohol Screen: Low Risk    Last Alcohol Screening Score (AUDIT): 1  Depression (PHQ2-9): Low Risk    PHQ-2 Score: 0  Financial Resource Strain: Low Risk    Difficulty of Paying Living Expenses: Not hard at all  Food Insecurity: No Food Insecurity   Worried About Charity fundraiser in the Last Year: Never true   Ran Out of Food in the Last Year: Never true  Housing: Low Risk    Last Housing Risk Score: 0  Physical Activity: Sufficiently Active  Days of Exercise per Week: 5 days   Minutes of Exercise per Session: 30 min  Social Connections: Socially Integrated   Frequency of Communication with Friends and Family: More than three times a week   Frequency of Social Gatherings with Friends and Family: More than three times a week   Attends Religious Services: More than 4 times per year   Active Member of [employer] or Organizations: Yes   Attends Music therapist: More than 4 times per year   Marital Status: Married  Stress: No Stress Concern Present   Feeling of Stress : Not at all  Tobacco Use: Medium Risk   Smoking Tobacco Use: Former   Smokeless Tobacco Use: Never   Passive Exposure: Not on Pensions consultant Needs: No Transportation Needs   Lack of Transportation (Medical): No   Lack of Transportation (Non-Medical): No    CCM Care Plan  Allergies  Allergen Reactions   Ferumoxytol Other (See Comments)    Stroke like symptoms; has hx of MG as well     Medications Reviewed Today     Reviewed by Cassandria Anger, MD (Physician) on 07/08/21 at 1110  Med List Status: <None>   Medication Order Taking? Sig Documenting Provider Last Dose Status Informant  Cholecalciferol (VITAMIN D3) 2000 units TABS [id number] Yes Take 2,000 Units by mouth at bedtime. [provider]  Taking Active Spouse/Significant Other  Coenzyme Q10 (CO Q-10 PO) [id number] Yes Take 1 tablet by mouth at bedtime. [provider] Taking Active Spouse/Significant Other  Cyanocobalamin (VITAMIN B-12) 1000 MCG SUBL [id number] Yes Place 1 tablet (1,000 mcg total) under the tongue daily.  Patient taking differently: Place 1,000 mcg under the tongue at bedtime.   [name], [name], MD Taking Active   donepezil (ARICEPT) 10 MG tablet [id number] Yes TAKE 1 TABLET BY MOUTH EVERYDAY AT BEDTIME [name], [name], MD Taking Active   famotidine (PEPCID) 40 MG tablet [id number] Yes Take 1 tablet by mouth at bedtime [name], [name], MD Taking Active   finasteride (PROSCAR) 5 MG tablet [id number] Yes TAKE 1 TABLET BY MOUTH  DAILY [name], [name], MD Taking Active   furosemide (LASIX) 20 MG tablet [id number] Yes TAKE 1 TO 2 TABLETS BY  MOUTH DAILY IN THE EVENING  Patient taking differently: Take 20 mg by mouth daily. TAKE 1 TO 2 TABLETS BY  MOUTH DAILY IN THE EVENING   [name], [name], MD Taking Active   LORazepam (ATIVAN) 1 MG tablet [id number] Yes Take 1 tablet (1 mg total) by mouth 2 (two) times daily as needed for anxiety.  Patient taking differently: Take 1 mg by mouth daily as needed for anxiety.   [name], [name], MD Taking Active   memantine (NAMENDA) 10 MG tablet [id number]  Take 1 tablet (10 mg total) by mouth 2 (two) times daily. [name], [name], MD  Active   mycophenolate (CELLCEPT) 500 MG tablet [id number] Yes Take 1,000 mg by mouth 2 (two) times daily. [provider] Taking Active   potassium chloride (KLOR-CON) 10 MEQ tablet [id number] Yes Take 1 tablet (10 mEq total) by mouth 2 (two) times daily. [name], [name], MD Taking Active   predniSONE (DELTASONE) 10 MG tablet [id number] Yes 25 mg qod 20 mg qod  Patient taking differently: Take 10 mg by mouth daily. 9m every other day   [name], AEvie Lacks MD Taking Active   pyridostigmine (MESTINON) 60 MG  tablet [id number] One in AM , one at lunch, 2  at bedtime.  Patient taking differently: One in AM , one in PM, and 1 at bedtime if he need   Dohmeier, Asencion Partridge, MD Taking Active   sertraline (ZOLOFT) 100 MG tablet [id number] Yes TAKE 1 TABLET BY MOUTH EVERY DAY [name], [name], MD Taking Active   tamsulosin (FLOMAX) 0.4 MG CAPS capsule [id number] Yes Take 2 capsules by mouth every day [name], [name], MD Taking Active   vitamin C (ASCORBIC ACID) 500 MG tablet [id number] Yes Take 500 mg by mouth daily. [provider] Taking Active             Patient Active Problem List   Diagnosis Date Noted   Anxiety 04/04/2021   Prostatitis 04/04/2021   Atherosclerosis of aorta ([place]) 12/10/2020   Alzheimer disease ([place]) 11/29/2019   Depressed mood 05/23/2019   GERD (gastroesophageal reflux disease) 09/09/2018   Chronic coughing 06/07/2018   Hypokalemia 04/16/2018   History of plasmapheresis 04/12/2018   MCI (mild cognitive impairment) 04/12/2018   Myasthenia exacerbation ([place]) 04/12/2018   Weakness of both lower extremities 01/13/2018   Myasthenia gravis, AChR antibody positive ([place]) 01/13/2018   Dysarthria 12/04/2017   Dysphagia    CVA (cerebral vascular accident) ([place]) 11/30/2017   Angio-edema 11/26/2017   S/P knee replacement 09/25/2017   Knee pain, chronic 08/14/2017   OSA on CPAP 03/05/2017   OSA (obstructive sleep apnea) 09/30/2016   S/P shoulder replacement, left 07/04/2016   Preop exam for internal medicine 04/16/2016   Claudication ([place]) 09/14/2015   Pain in the chest    Abnormal stress test 08/14/2015   Dyspnea 07/23/2015   Paresthesia 10/25/2014   Weakness 04/13/2014   Heme positive stool 02/22/2014   Benign prostatic hyperplasia 02/22/2014   B12 deficiency 02/21/2014   Anemia, iron deficiency 02/21/2014   Leg weakness, bilateral 01/24/2014   Erectile dysfunction 01/24/2014   Edema 01/24/2014   URI, acute 08/31/2013   Cellulitis of arm, right  07/13/2013   Grief 07/13/2013   Well adult exam 03/01/2012   CARPAL TUNNEL SYNDROME 11/04/2010   BENIGN PROSTATIC HYPERTROPHY, WITH URINARY OBSTRUCTION 11/04/2010   WRIST PAIN 11/04/2010   LEG PAIN 07/16/2010   COUGH 05/20/2010   TOBACCO USE, QUIT 07/04/2009   WARTS, VIRAL, UNSPECIFIED 11/06/2008   INSOMNIA, PERSISTENT 01/14/2008   Essential hypertension 01/14/2008   RHINITIS 01/14/2008   ABDOMINAL PAIN 01/14/2008   TREMOR 09/07/2007   Actinic keratosis 08/12/2007   Neoplasm of uncertain behavior of skin 07/06/2007   LOW BACK PAIN 07/06/2007   Dyslipidemia 06/05/2007   Coronary atherosclerosis 06/05/2007   Osteoarthritis 06/05/2007    Immunization History  Administered Date(s) Administered   H1N1 08/02/2008   Influenza Whole 06/10/2006, 06/15/2008, 05/22/2009, 05/25/2010, 05/26/2011, 04/25/2012   Influenza, High Dose Seasonal PF 04/15/2017, 05/09/2019, 04/22/2020   Influenza,inj,Quad PF,6+ Mos 05/23/2013, 04/13/2014, 05/23/2015, 04/16/2016   PFIZER(Purple Top)SARS-COV-2 Vaccination 09/02/2019, 09/23/2019, 04/29/2020   Pneumococcal Conjugate-13 07/10/2014   Pneumococcal Polysaccharide-23 06/25/2006, 12/30/2016, 04/09/2020   Td 07/04/2009   Tdap 06/04/2013   Zoster Recombinat (Shingrix) 12/28/2016, 05/09/2017   Zoster, Live 12/24/2006    Conditions to be addressed/monitored:  Hypertension, Hyperlipidemia, GERD, Depression, Anxiety, BPH, and Myasthenia Gravis, Alzheimer's Disease   Care Plan : CCM Care Plan  Updates made by Tomasa Blase, RPH since 08/15/2021 12:00 AM     Problem: Hypertension, Hyperlipidemia, GERD, Depression, Anxiety, BPH, and Myasthenia Gravis, Alzheimer's Disease      Long-Range Goal: Disease Management   Start Date: 05/16/2021  Expected End Date: 11/13/2021  This Visit's  Progress: On track  Recent Progress: On track  Priority: High  Note:   Current Barriers:  Unable to independently monitor therapeutic efficacy  Pharmacist Clinical  Goal(s):  Patient will achieve adherence to monitoring guidelines and medication adherence to achieve therapeutic efficacy through collaboration with PharmD and provider.   Interventions: 1:1 collaboration with [name], [name], MD regarding development and update of comprehensive plan of care as evidenced by provider attestation and co-signature Inter-disciplinary care team collaboration (see longitudinal plan of care) Comprehensive medication review performed; medication list updated in electronic medical record   Hypertension (BP goal <140/90) -Controlled -Current treatment: Furosemide 176m - 1-2 tablet daily  Potassium Chloride 1176m - 1 tablet twice daily Lab Results  Component Value Date   K 4.1 03/28/2021  -Medications previously tried: n/a  -Current home readings: n/a BP Readings from Last 3 Encounters:  07/08/21 122/60  04/04/21 118/80  04/04/21 118/80  -Current dietary habits: low sodium diet -Current exercise habits:trying to stay as active as possible  -Denies hypotensive/hypertensive symptoms -Educated on BP goals and benefits of medications for prevention of heart attack, stroke and kidney damage; Daily salt intake goal < 2300 mg; Symptoms of hypotension and importance of maintaining adequate hydration; -Counseled on diet and exercise extensively Recommended to continue current medication   Hyperlipidemia: (LDL goal < 100) -Not ideally controlled Lab Results  Component Value Date   LDLCALC 162 (H) 03/28/2021  -Current treatment: N/a - was last on simvastatin but stopped 2 years ago due to pain in legs  -Medications previously tried: simvastatin, pravastatin   -Current dietary patterns: cognizant of diet and watching intake of high cholesterol foods -Educated on Cholesterol goals;  Benefits of statin for ASCVD risk reduction; Importance of limiting foods high in cholesterol; -Counseled on diet and exercise extensively Recommended retrial of statin  medication to prevent CVA - patient and wife will discuss with PCP at next visit   Depression/Anxiety (Goal: Promotion of positive mood / prevention of anxiety attacks) -Controlled -Current treatment: Lorazepam 76m63m 1 tablet twice daily as needed for anxiety  Sertraline 100m27m1 tablet daily  -Medications previously tried/failed: duloxetine, paroxetine, zolpidem -PHQ9-2: 0 -Educated on Benefits of medication for symptom control Benefits of cognitive-behavioral therapy with or without medication -Recommended to continue current medication  Alzheimer's Disease (Goal: Prevention of disease progression) - followed by [facility] providers -Controlled - has started memantine - increased to BID dosing about 1 week ago - no changes noted, declines AE since starting  -Current treatment  Donepezil 10mg43m tablet daily  Memantine 10mg 44mtablet twice daily  -Medications previously tried: n/a  -Recommended to continue current medication  Myasthenia Gravis (Goal: Prevention of disease progression / symptom management) - Followed by [facility]  -Controlled -Current treatment  Mycophenolate 500mg -64mablets twice daily  Pyridostigmine 60mg - 645mblet in AM in the PM and 1 tablet at bedtime if needed (does not typically use) Prednisone 10mg/ 45m49mily104mlternating) -Medications previously tried: n/a  -Recommended to continue current medication - patient trialed lower dose of prednisone (10mg QOD) 70mck issues with that dose, better controlled on alternating daily dose   BPH (Goal: Prevention of disease progression / control of urinary symptoms) -Controlled Lab Results  Component Value Date   PSA 0.21 03/28/2021   PSA 0.25 05/23/2019   PSA 0.49 03/01/2012  -Current treatment  Finasteride 45mg -1 tabl60mdaily  Tamsulosin 0.4mg - 2 caps48ms daily  -Medications previously tried: n/a  -Recommended  to continue current medication  GERD (Goal: Prevention/ control of acid  reflux) -Controlled -Current treatment  Famotidine 68m - 1 tablet daily  -Medications previously tried: n/a  -Counseled on diet and exercise extensively Recommended to continue current medication    Health Maintenance -Vaccine gaps: COVID Booster / Influenza vaccine  -Current therapy:  CoQ10 1077m- 1 tablet daily after breakfast  Vitamin D3 2000units- 1 tablet daily  Vitamin C 50048m 1 tablet daily  Vitmain B12 1000m34m 1 tablet daily  -Educated on Cost vs benefit of each product must be carefully weighed by individual consumer -Patient is satisfied with current therapy and denies issues -Recommended to continue current medication  Patient Goals/Self-Care Activities Patient will:  - take medications as prescribed collaborate with provider on medication access solutions  Follow Up Plan: Telephone follow up appointment with care management team member scheduled for: 4 months The patient has been provided with contact information for the care management team and has been advised to call with any health related questions or concerns.        Medication Assistance: None required.  Patient affirms current coverage meets needs.   Patient's [facility] pharmacy [facility]:  [id number] [place] [address]: [phone] [address] [place]: [phone] pill box? Yes Pt endorses 100% compliance  Care Plan and Follow Up Patient Decision:  Patient agrees to Care Plan and Follow-up.  Plan: Telephone follow up appointment with care management team member scheduled for:  4 months  and The patient has been provided with contact information for the care management team and has been advised to call with any health related questions or concerns.   DanieTomasa BlasermD Clinical Pharmacist, [place] examination/treatment/procedure(s) were performed by non-physician practitioner and as supervising physician I was immediately available for consultation/collaboration.  I agree with above. AleksLew Dawes

## 2021-08-16 ENCOUNTER — Other Ambulatory Visit: Payer: Self-pay | Admitting: *Deleted

## 2021-08-16 MED ORDER — FINASTERIDE 5 MG PO TABS: 5.0000 mg | ORAL_TABLET | Freq: Every day | ORAL | 3 refills | Status: DC

## 2021-08-23 ENCOUNTER — Emergency Department (HOSPITAL_COMMUNITY): Payer: Medicare Other

## 2021-08-23 ENCOUNTER — Inpatient Hospital Stay (HOSPITAL_COMMUNITY)
Admission: EM | Admit: 2021-08-23 | Discharge: 2021-08-25 | DRG: 194 | Disposition: A | Discharge status: Home Health | Payer: Medicare Other | Attending: Internal Medicine | Admitting: Internal Medicine

## 2021-08-23 ENCOUNTER — Other Ambulatory Visit: Payer: Self-pay

## 2021-08-23 ENCOUNTER — Encounter (HOSPITAL_COMMUNITY): Payer: Self-pay

## 2021-08-23 DIAGNOSIS — G4733 Obstructive sleep apnea (adult) (pediatric): Secondary | ICD-10-CM | POA: Diagnosis not present

## 2021-08-23 DIAGNOSIS — G7000 Myasthenia gravis without (acute) exacerbation: Secondary | ICD-10-CM | POA: Diagnosis present

## 2021-08-23 DIAGNOSIS — Z96653 Presence of artificial knee joint, bilateral: Secondary | ICD-10-CM | POA: Diagnosis not present

## 2021-08-23 DIAGNOSIS — G309 Alzheimer's disease, unspecified: Secondary | ICD-10-CM | POA: Diagnosis not present

## 2021-08-23 DIAGNOSIS — F0280 Dementia in other diseases classified elsewhere without behavioral disturbance: Secondary | ICD-10-CM | POA: Diagnosis present

## 2021-08-23 DIAGNOSIS — Z87891 Personal history of nicotine dependence: Secondary | ICD-10-CM

## 2021-08-23 DIAGNOSIS — I10 Essential (primary) hypertension: Secondary | ICD-10-CM | POA: Diagnosis not present

## 2021-08-23 DIAGNOSIS — Z96612 Presence of left artificial shoulder joint: Secondary | ICD-10-CM | POA: Diagnosis present

## 2021-08-23 DIAGNOSIS — Z800 Family history of malignant neoplasm of digestive organs: Secondary | ICD-10-CM | POA: Diagnosis not present

## 2021-08-23 DIAGNOSIS — Z803 Family history of malignant neoplasm of breast: Secondary | ICD-10-CM

## 2021-08-23 DIAGNOSIS — F05 Delirium due to known physiological condition: Secondary | ICD-10-CM | POA: Diagnosis present

## 2021-08-23 DIAGNOSIS — J1000 Influenza due to other identified influenza virus with unspecified type of pneumonia: Secondary | ICD-10-CM | POA: Diagnosis not present

## 2021-08-23 DIAGNOSIS — Z66 Do not resuscitate: Secondary | ICD-10-CM | POA: Diagnosis present

## 2021-08-23 DIAGNOSIS — Z20822 Contact with and (suspected) exposure to covid-19: Secondary | ICD-10-CM | POA: Diagnosis not present

## 2021-08-23 DIAGNOSIS — Z8249 Family history of ischemic heart disease and other diseases of the circulatory system: Secondary | ICD-10-CM

## 2021-08-23 DIAGNOSIS — F0284 Dementia in other diseases classified elsewhere, unspecified severity, with anxiety: Secondary | ICD-10-CM | POA: Diagnosis not present

## 2021-08-23 DIAGNOSIS — E785 Hyperlipidemia, unspecified: Secondary | ICD-10-CM | POA: Diagnosis not present

## 2021-08-23 DIAGNOSIS — J111 Influenza due to unidentified influenza virus with other respiratory manifestations: Secondary | ICD-10-CM

## 2021-08-23 DIAGNOSIS — Z6833 Body mass index (BMI) 33.0-33.9, adult: Secondary | ICD-10-CM

## 2021-08-23 DIAGNOSIS — F430 Acute stress reaction: Secondary | ICD-10-CM | POA: Diagnosis present

## 2021-08-23 DIAGNOSIS — E669 Obesity, unspecified: Secondary | ICD-10-CM | POA: Diagnosis present

## 2021-08-23 DIAGNOSIS — K219 Gastro-esophageal reflux disease without esophagitis: Secondary | ICD-10-CM | POA: Diagnosis not present

## 2021-08-23 DIAGNOSIS — J1100 Influenza due to unidentified influenza virus with unspecified type of pneumonia: Secondary | ICD-10-CM | POA: Diagnosis present

## 2021-08-23 DIAGNOSIS — Z888 Allergy status to other drugs, medicaments and biological substances status: Secondary | ICD-10-CM | POA: Diagnosis not present

## 2021-08-23 DIAGNOSIS — R079 Chest pain, unspecified: Secondary | ICD-10-CM | POA: Diagnosis not present

## 2021-08-23 DIAGNOSIS — R059 Cough, unspecified: Secondary | ICD-10-CM | POA: Diagnosis not present

## 2021-08-23 DIAGNOSIS — J189 Pneumonia, unspecified organism: Secondary | ICD-10-CM

## 2021-08-23 DIAGNOSIS — R404 Transient alteration of awareness: Secondary | ICD-10-CM | POA: Diagnosis not present

## 2021-08-23 DIAGNOSIS — R55 Syncope and collapse: Secondary | ICD-10-CM | POA: Diagnosis present

## 2021-08-23 DIAGNOSIS — I110 Hypertensive heart disease with heart failure: Secondary | ICD-10-CM | POA: Diagnosis present

## 2021-08-23 DIAGNOSIS — I5032 Chronic diastolic (congestive) heart failure: Secondary | ICD-10-CM | POA: Diagnosis present

## 2021-08-23 DIAGNOSIS — E860 Dehydration: Secondary | ICD-10-CM | POA: Diagnosis not present

## 2021-08-23 DIAGNOSIS — Z79899 Other long term (current) drug therapy: Secondary | ICD-10-CM | POA: Diagnosis not present

## 2021-08-23 DIAGNOSIS — N400 Enlarged prostate without lower urinary tract symptoms: Secondary | ICD-10-CM | POA: Diagnosis present

## 2021-08-23 DIAGNOSIS — Z820 Family history of epilepsy and other diseases of the nervous system: Secondary | ICD-10-CM

## 2021-08-23 DIAGNOSIS — R0902 Hypoxemia: Secondary | ICD-10-CM | POA: Diagnosis not present

## 2021-08-23 DIAGNOSIS — Z743 Need for continuous supervision: Secondary | ICD-10-CM | POA: Diagnosis not present

## 2021-08-23 DIAGNOSIS — I2510 Atherosclerotic heart disease of native coronary artery without angina pectoris: Secondary | ICD-10-CM | POA: Diagnosis not present

## 2021-08-23 HISTORY — DX: Myasthenia gravis without (acute) exacerbation: G70.00

## 2021-08-23 HISTORY — DX: Unspecified dementia, unspecified severity, without behavioral disturbance, psychotic disturbance, mood disturbance, and anxiety: F03.90

## 2021-08-23 LAB — COMPREHENSIVE METABOLIC PANEL
ALT: 17 U/L (ref 0–44)
AST: 19 U/L (ref 15–41)
Albumin: 3.4 g/dL — ABNORMAL LOW (ref 3.5–5.0)
Alkaline Phosphatase: 65 U/L (ref 38–126)
Anion gap: 7 (ref 5–15)
BUN: 20 mg/dL (ref 8–23)
CO2: 25 mmol/L (ref 22–32)
Calcium: 8.8 mg/dL — ABNORMAL LOW (ref 8.9–10.3)
Chloride: 103 mmol/L (ref 98–111)
Creatinine, Ser: 0.87 mg/dL (ref 0.61–1.24)
GFR, Estimated: >60 mL/min (ref >60)
Glucose, Bld: 108 mg/dL — ABNORMAL HIGH (ref 70–99)
Potassium: 3.4 mmol/L — ABNORMAL LOW (ref 3.5–5.1)
Sodium: 135 mmol/L (ref 135–145)
Total Bilirubin: 1 mg/dL (ref 0.3–1.2)
Total Protein: 5.6 g/dL — ABNORMAL LOW (ref 6.5–8.1)

## 2021-08-23 LAB — CBC WITH DIFFERENTIAL/PLATELET
Abs Immature Granulocytes: 0.02 10*3/uL (ref 0.00–0.07)
Basophils Absolute: 0 10*3/uL (ref 0.0–0.1)
Basophils Relative: 0 %
Eosinophils Absolute: 0 10*3/uL (ref 0.0–0.5)
Eosinophils Relative: 0 %
HCT: 39.9 % (ref 39.0–52.0)
Hemoglobin: 12.7 g/dL — ABNORMAL LOW (ref 13.0–17.0)
Immature Granulocytes: 0 %
Lymphocytes Relative: 4 %
Lymphs Abs: 0.3 10*3/uL — ABNORMAL LOW (ref 0.7–4.0)
MCH: 30.4 pg (ref 26.0–34.0)
MCHC: 31.8 g/dL (ref 30.0–36.0)
MCV: 95.5 fL (ref 80.0–100.0)
Monocytes Absolute: 0.6 10*3/uL (ref 0.1–1.0)
Monocytes Relative: 9 %
Neutro Abs: 5.6 10*3/uL (ref 1.7–7.7)
Neutrophils Relative %: 87 %
Platelets: 125 10*3/uL — ABNORMAL LOW (ref 150–400)
RBC: 4.18 MIL/uL — ABNORMAL LOW (ref 4.22–5.81)
RDW: 13.3 % (ref 11.5–15.5)
WBC: 6.5 10*3/uL (ref 4.0–10.5)
nRBC: 0 % (ref 0.0–0.2)

## 2021-08-23 LAB — TROPONIN I (HIGH SENSITIVITY)
Troponin I (High Sensitivity): 14 ng/L (ref <18)
Troponin I (High Sensitivity): 21 ng/L — ABNORMAL HIGH (ref <18)

## 2021-08-23 LAB — PROTIME-INR
INR: 1.1 (ref 0.8–1.2)
Prothrombin Time: 14.2 seconds (ref 11.4–15.2)

## 2021-08-23 LAB — RESP PANEL BY RT-PCR (FLU A&B, COVID) ARPGX2
Influenza A by PCR: POSITIVE — AB
Influenza B by PCR: NEGATIVE
SARS Coronavirus 2 by RT PCR: NEGATIVE

## 2021-08-23 LAB — LACTIC ACID, PLASMA: Lactic Acid, Venous: 1.5 mmol/L (ref 0.5–1.9)

## 2021-08-23 LAB — APTT: aPTT: 31 seconds (ref 24–36)

## 2021-08-23 MED ORDER — SODIUM CHLORIDE 0.9 % IV SOLN
100.0000 mg | Freq: Two times a day (BID) | INTRAVENOUS | Status: DC
  Administered 2021-08-24 (×2): 100 mg via INTRAVENOUS
  Filled 2021-08-23 (×3): qty 100

## 2021-08-23 MED ORDER — ACETAMINOPHEN 325 MG PO TABS: 650.0000 mg | ORAL_TABLET | Freq: Four times a day (QID) | ORAL | Status: DC | PRN

## 2021-08-23 MED ORDER — LACTATED RINGERS IV BOLUS (SEPSIS)
1000.0000 mL | Freq: Once | INTRAVENOUS | Status: AC
  Administered 2021-08-23: 08:00:00 1000 mL via INTRAVENOUS

## 2021-08-23 MED ORDER — LACTATED RINGERS IV BOLUS (SEPSIS)
500.0000 mL | Freq: Once | INTRAVENOUS | Status: AC
  Administered 2021-08-23: 09:00:00 500 mL via INTRAVENOUS

## 2021-08-23 MED ORDER — POLYETHYLENE GLYCOL 3350 17 G PO PACK: 17.0000 g | PACK | Freq: Every day | ORAL | Status: DC | PRN

## 2021-08-23 MED ORDER — ACETAMINOPHEN 650 MG RE SUPP: 650.0000 mg | Freq: Four times a day (QID) | RECTAL | Status: DC | PRN

## 2021-08-23 MED ORDER — ALBUTEROL SULFATE (2.5 MG/3ML) 0.083% IN NEBU: 2.5000 mg | INHALATION_SOLUTION | RESPIRATORY_TRACT | Status: DC | PRN

## 2021-08-23 MED ORDER — ONDANSETRON HCL 4 MG/2ML IJ SOLN: 4.0000 mg | Freq: Four times a day (QID) | INTRAMUSCULAR | Status: DC | PRN

## 2021-08-23 MED ORDER — SODIUM CHLORIDE 0.9 % IV SOLN: INTRAVENOUS | Status: DC

## 2021-08-23 MED ORDER — BISACODYL 5 MG PO TBEC: 5.0000 mg | DELAYED_RELEASE_TABLET | Freq: Every day | ORAL | Status: DC | PRN

## 2021-08-23 MED ORDER — LACTATED RINGERS IV BOLUS (SEPSIS)
1000.0000 mL | Freq: Once | INTRAVENOUS | Status: AC
  Administered 2021-08-23: 09:00:00 1000 mL via INTRAVENOUS

## 2021-08-23 MED ORDER — SODIUM CHLORIDE 0.9 % IV SOLN
2.0000 g | INTRAVENOUS | Status: DC
  Administered 2021-08-23 – 2021-08-25 (×3): 2 g via INTRAVENOUS
  Filled 2021-08-23 (×3): qty 20

## 2021-08-23 MED ORDER — MEMANTINE HCL 10 MG PO TABS
10.0000 mg | ORAL_TABLET | Freq: Two times a day (BID) | ORAL | Status: DC
  Administered 2021-08-23 – 2021-08-25 (×5): 10 mg via ORAL
  Filled 2021-08-23 (×6): qty 1

## 2021-08-23 MED ORDER — TAMSULOSIN HCL 0.4 MG PO CAPS
0.8000 mg | ORAL_CAPSULE | Freq: Every day | ORAL | Status: DC
  Administered 2021-08-23 – 2021-08-25 (×3): 0.8 mg via ORAL
  Filled 2021-08-23 (×3): qty 2

## 2021-08-23 MED ORDER — GUAIFENESIN ER 600 MG PO TB12: 600.0000 mg | ORAL_TABLET | Freq: Two times a day (BID) | ORAL | Status: DC | PRN

## 2021-08-23 MED ORDER — PYRIDOSTIGMINE BROMIDE 60 MG PO TABS
60.0000 mg | ORAL_TABLET | Freq: Two times a day (BID) | ORAL | Status: DC
  Administered 2021-08-23 – 2021-08-25 (×4): 60 mg via ORAL
  Filled 2021-08-23 (×5): qty 1

## 2021-08-23 MED ORDER — PREDNISONE 20 MG PO TABS: 10.0000 mg | ORAL_TABLET | ORAL | Status: DC

## 2021-08-23 MED ORDER — HALOPERIDOL LACTATE 5 MG/ML IJ SOLN
5.0000 mg | Freq: Four times a day (QID) | INTRAMUSCULAR | Status: DC | PRN
  Administered 2021-08-23: 19:00:00 5 mg via INTRAVENOUS
  Filled 2021-08-23: qty 1

## 2021-08-23 MED ORDER — MORPHINE SULFATE (PF) 2 MG/ML IV SOLN: 2.0000 mg | INTRAVENOUS | Status: DC | PRN

## 2021-08-23 MED ORDER — PREDNISONE 20 MG PO TABS: 20.0000 mg | ORAL_TABLET | Freq: Every day | ORAL | Status: DC

## 2021-08-23 MED ORDER — LACTATED RINGERS IV SOLN: INTRAVENOUS | Status: DC

## 2021-08-23 MED ORDER — SODIUM CHLORIDE 0.9 % IV SOLN
500.0000 mg | INTRAVENOUS | Status: DC
  Administered 2021-08-23: 09:00:00 500 mg via INTRAVENOUS
  Filled 2021-08-23: qty 5

## 2021-08-23 MED ORDER — SERTRALINE HCL 100 MG PO TABS
100.0000 mg | ORAL_TABLET | Freq: Every day | ORAL | Status: DC
  Administered 2021-08-23 – 2021-08-25 (×3): 100 mg via ORAL
  Filled 2021-08-23 (×3): qty 1

## 2021-08-23 MED ORDER — PREDNISONE 20 MG PO TABS
20.0000 mg | ORAL_TABLET | Freq: Every day | ORAL | Status: DC
  Administered 2021-08-23 – 2021-08-25 (×3): 20 mg via ORAL
  Filled 2021-08-23 (×3): qty 1

## 2021-08-23 MED ORDER — DONEPEZIL HCL 10 MG PO TABS
10.0000 mg | ORAL_TABLET | Freq: Every day | ORAL | Status: DC
  Administered 2021-08-23 – 2021-08-24 (×2): 10 mg via ORAL
  Filled 2021-08-23 (×3): qty 1

## 2021-08-23 MED ORDER — LORAZEPAM 1 MG PO TABS: 1.0000 mg | ORAL_TABLET | Freq: Every day | ORAL | Status: DC | PRN

## 2021-08-23 MED ORDER — MYCOPHENOLATE MOFETIL 250 MG PO CAPS
500.0000 mg | ORAL_CAPSULE | Freq: Two times a day (BID) | ORAL | Status: DC
  Administered 2021-08-23 – 2021-08-25 (×5): 500 mg via ORAL
  Filled 2021-08-23 (×6): qty 2

## 2021-08-23 MED ORDER — FINASTERIDE 5 MG PO TABS
5.0000 mg | ORAL_TABLET | Freq: Every day | ORAL | Status: DC
  Administered 2021-08-23 – 2021-08-25 (×3): 5 mg via ORAL
  Filled 2021-08-23 (×3): qty 1

## 2021-08-23 MED ORDER — OXYCODONE HCL 5 MG PO TABS: 5.0000 mg | ORAL_TABLET | ORAL | Status: DC | PRN

## 2021-08-23 MED ORDER — DOCUSATE SODIUM 100 MG PO CAPS
100.0000 mg | ORAL_CAPSULE | Freq: Two times a day (BID) | ORAL | Status: DC
  Administered 2021-08-23 – 2021-08-25 (×4): 100 mg via ORAL
  Filled 2021-08-23 (×4): qty 1

## 2021-08-23 MED ORDER — ENOXAPARIN SODIUM 40 MG/0.4ML IJ SOSY
40.0000 mg | PREFILLED_SYRINGE | INTRAMUSCULAR | Status: DC
  Administered 2021-08-23 – 2021-08-24 (×2): 40 mg via SUBCUTANEOUS
  Filled 2021-08-23 (×2): qty 0.4

## 2021-08-23 MED ORDER — HYDRALAZINE HCL 20 MG/ML IJ SOLN: 5.0000 mg | INTRAMUSCULAR | Status: DC | PRN

## 2021-08-23 MED ORDER — ONDANSETRON HCL 4 MG PO TABS: 4.0000 mg | ORAL_TABLET | Freq: Four times a day (QID) | ORAL | Status: DC | PRN

## 2021-08-23 MED ORDER — FAMOTIDINE 20 MG PO TABS
40.0000 mg | ORAL_TABLET | Freq: Every day | ORAL | Status: DC
  Administered 2021-08-23 – 2021-08-24 (×2): 40 mg via ORAL
  Filled 2021-08-23 (×2): qty 2

## 2021-08-23 MED ORDER — OSELTAMIVIR PHOSPHATE 75 MG PO CAPS
75.0000 mg | ORAL_CAPSULE | Freq: Two times a day (BID) | ORAL | Status: DC
  Administered 2021-08-23 – 2021-08-25 (×5): 75 mg via ORAL
  Filled 2021-08-23 (×6): qty 1

## 2021-08-23 NOTE — Assessment & Plan Note (Signed)
-  Continue Flomax, Proscar

## 2021-08-23 NOTE — ED Provider Notes (Signed)
[facility] EMERGENCY DEPARTMENT Provider Note   CSN: [id number] Arrival date & time: 08/23/21  6269     History Chief Complaint  Patient presents with   Near Syncope    [name] is a 80 y.o. male.   Near Syncope   Patient has history of several medical problems including myasthenia gravis and dementia.  Patient states he has not been feeling well for a while.  He is not able to tell me exactly how long.  He has been coughing and congested.  He has had some nausea and vomiting.  He denies any abdominal pain.  He is not having any chest pain.  No headaches.  Patient this morning had a near syncopal episode when he was trying to get up.  According to the EMS report the wife stated that he had been having cough and congestion for the last several days.  When EMS arrived patient was noted to have an oxygen saturation of 84% on room air.  He was orthostatic.  He was placed on oxygen supplementation  Past Medical History:  Diagnosis Date   Anemia    Blood transfusion without reported diagnosis 02/23/2014   3 units for iron deficiency anemia. as a baby - whenhe had pneumonia   BPH (benign prostatic hyperplasia)    CTS (carpal tunnel syndrome)    better   Dyspnea    GERD (gastroesophageal reflux disease)    Hyperlipidemia    Hypertension    "THEY DIAGNOSED ME YEARS AGO BUT LATELY IVE ACTUALLY HAD LOW BLOOD PRESSURES "    Insomnia    LBP (low back pain)    Osteoarthritis    Pneumonia    as a baby   Sleep apnea    no cpap    Patient Active Problem List   Diagnosis Date Noted   Anxiety 04/04/2021   Prostatitis 04/04/2021   Atherosclerosis of aorta ([place]) 12/10/2020   Alzheimer disease ([place]) 11/29/2019   Depressed mood 05/23/2019   GERD (gastroesophageal reflux disease) 09/09/2018   Chronic coughing 06/07/2018   Hypokalemia 04/16/2018   History of plasmapheresis 04/12/2018   MCI (mild cognitive impairment) 04/12/2018   Myasthenia exacerbation  ([place]) 04/12/2018   Weakness of both lower extremities 01/13/2018   Myasthenia gravis, AChR antibody positive ([place]) 01/13/2018   Dysarthria 12/04/2017   Dysphagia    CVA (cerebral vascular accident) ([place]) 11/30/2017   Angio-edema 11/26/2017   S/P knee replacement 09/25/2017   Knee pain, chronic 08/14/2017   OSA on CPAP 03/05/2017   OSA (obstructive sleep apnea) 09/30/2016   S/P shoulder replacement, left 07/04/2016   Preop exam for internal medicine 04/16/2016   Claudication ([place]) 09/14/2015   Pain in the chest    Abnormal stress test 08/14/2015   Dyspnea 07/23/2015   Paresthesia 10/25/2014   Weakness 04/13/2014   Heme positive stool 02/22/2014   Benign prostatic hyperplasia 02/22/2014   B12 deficiency 02/21/2014   Anemia, iron deficiency 02/21/2014   Leg weakness, bilateral 01/24/2014   Erectile dysfunction 01/24/2014   Edema 01/24/2014   URI, acute 08/31/2013   Cellulitis of arm, right 07/13/2013   Grief 07/13/2013   Well adult exam 03/01/2012   CARPAL TUNNEL SYNDROME 11/04/2010   BENIGN PROSTATIC HYPERTROPHY, WITH URINARY OBSTRUCTION 11/04/2010   WRIST PAIN 11/04/2010   LEG PAIN 07/16/2010   COUGH 05/20/2010   TOBACCO USE, QUIT 07/04/2009   WARTS, VIRAL, UNSPECIFIED 11/06/2008   INSOMNIA, PERSISTENT 01/14/2008   Essential hypertension 01/14/2008   RHINITIS 01/14/2008  ABDOMINAL PAIN 01/14/2008   TREMOR 09/07/2007   Actinic keratosis 08/12/2007   Neoplasm of uncertain behavior of skin 07/06/2007   LOW BACK PAIN 07/06/2007   Dyslipidemia 06/05/2007   Coronary atherosclerosis 06/05/2007   Osteoarthritis 06/05/2007    Past Surgical History:  Procedure Laterality Date   CARDIAC CATHETERIZATION N/A 08/15/2015   Procedure: Left Heart Cath and Coronary Angiography;  Surgeon: Lorretta Harp, MD;  Location: [facility];  Service: Cardiovascular;  Laterality: N/A;   CARPAL TUNNEL RELEASE Bilateral 1996, 2004   Dr Lenoard Aden thumb surgery.  more than once    COLONOSCOPY  2005, 2012   diverticulosis.    ESOPHAGOGASTRODUODENOSCOPY N/A 02/23/2014   Procedure: ESOPHAGOGASTRODUODENOSCOPY (EGD);  Surgeon: Irene Shipper, MD;  Location: [facility] ENDOSCOPY;  Service: Endoscopy;  Laterality: N/A;   EYE SURGERY  2012   both cataracts, Lasik   LEFT KNEE ARTHROSCOPY Left 07/16/2017    [facility]    LUMBAR FUSION     x 3   REVERSE SHOULDER ARTHROPLASTY Left 07/04/2016   Procedure: LEFT REVERSE SHOULDER ARTHROPLASTY;  Surgeon: Netta Cedars, MD;  Location: [facility];  Service: Orthopedics;  Laterality: Left;   TONSILLECTOMY  1946   TOTAL KNEE ARTHROPLASTY  2003   Right   TOTAL KNEE ARTHROPLASTY Left 09/25/2017   Procedure: LEFT TOTAL KNEE ARTHROPLASTY;  Surgeon: Sydnee Cabal, MD;  Location: [facility];  Service: Orthopedics;  Laterality: Left;       Family History  Problem Relation Age of Onset   Aneurysm Father        AAA   Heart disease Father 71       CHF   Alzheimer's disease Father    Cancer Brother    Alzheimer's disease Brother        dementia   Stomach cancer Brother    Cancer Mother 61       breast cancer   Heart disease Mother 23       MI   Autoimmune disease Son    CAD Brother    Colon cancer Neg Hx    Esophageal cancer Neg Hx    Pancreatic cancer Neg Hx    Prostate cancer Neg Hx    Rectal cancer Neg Hx     Social History   Tobacco Use   Smoking status: Former    Types: Cigarettes    Quit date: 01/08/1987    Years since quitting: 34.6   Smokeless tobacco: Never  Vaping Use   Vaping Use: Never used  Substance Use Topics   Alcohol use: Yes    Alcohol/week: 0.0 standard drinks    Comment: rare   Drug use: No    Home Medications Prior to Admission medications   Medication Sig Start Date End Date Taking? Authorizing Provider  Cholecalciferol (VITAMIN D3) 2000 units TABS Take 2,000 Units by mouth at bedtime.    [provider]  Coenzyme Q10 (CO Q-10 PO) Take 1 tablet by mouth at bedtime.    [provider]   Cyanocobalamin (VITAMIN B-12) 1000 MCG SUBL Place 1 tablet (1,000 mcg total) under the tongue daily. Patient taking differently: Place 1,000 mcg under the tongue at bedtime. 02/21/14   [name], [name], MD  donepezil (ARICEPT) 10 MG tablet TAKE 1 TABLET BY MOUTH EVERYDAY AT BEDTIME 10/24/20   [name], [name], MD  famotidine (PEPCID) 40 MG tablet Take 1 tablet by mouth at bedtime 05/23/21   [name], [name], MD  finasteride (PROSCAR) 5 MG tablet  Take 1 tablet (5 mg total) by mouth daily. 08/16/21   [name], [name], MD  furosemide (LASIX) 20 MG tablet TAKE 1 TO 2 TABLETS BY  MOUTH DAILY IN THE EVENING Patient taking differently: Take 20 mg by mouth daily. TAKE 1 TO 2 TABLETS BY  MOUTH DAILY IN THE EVENING 03/01/21   [name], [name], MD  LORazepam (ATIVAN) 1 MG tablet Take 1 tablet (1 mg total) by mouth 2 (two) times daily as needed for anxiety. Patient taking differently: Take 1 mg by mouth daily as needed for anxiety. 04/04/21   [name], [name], MD  memantine (NAMENDA) 10 MG tablet Take 1 tablet (10 mg total) by mouth 2 (two) times daily. 07/08/21   [name], [name], MD  mycophenolate (CELLCEPT) 500 MG tablet Take 1,000 mg by mouth 2 (two) times daily. 11/15/19   [provider]  potassium chloride (KLOR-CON) 10 MEQ tablet Take 1 tablet (10 mEq total) by mouth 2 (two) times daily. 12/03/20   [name], [name], MD  predniSONE (DELTASONE) 10 MG tablet 25 mg qod 20 mg qod Patient taking differently: Take 10 mg by mouth daily. 10mg  every other day 01/13/19   [name], [name], MD  pyridostigmine (MESTINON) 60 MG tablet One in AM , one at lunch, 2 at bedtime. Patient taking differently: One in AM , one in PM, and 1 at bedtime if he need 04/12/18   Dohmeier, Asencion Partridge, MD  sertraline (ZOLOFT) 100 MG tablet TAKE 1 TABLET BY MOUTH EVERY DAY 11/19/20   [name], [name], MD  tamsulosin (FLOMAX) 0.4 MG CAPS capsule Take 2 capsules by mouth every day 04/15/21   [name],  [name], MD  vitamin C (ASCORBIC ACID) 500 MG tablet Take 500 mg by mouth daily.    [provider]    Allergies    Ferumoxytol  Review of Systems   Review of Systems  Cardiovascular:  Positive for near-syncope.  All other systems reviewed and are negative.  Physical Exam Updated Vital Signs BP (!) 106/56    Pulse 75    Temp (!) 101.3 F (38.5 C) (Oral)    Resp (!) 26    Ht 1.829 m (6')    Wt 106.6 kg    SpO2 98%    BMI 31.87 kg/m   Physical Exam Vitals and nursing note reviewed.  Constitutional:      Appearance: He is well-developed. He is ill-appearing. He is not diaphoretic.  HENT:     Head: Normocephalic and atraumatic.     Right Ear: External ear normal.     Left Ear: External ear normal.     Mouth/Throat:     Pharynx: No oropharyngeal exudate or posterior oropharyngeal erythema.  Eyes:     General: No scleral icterus.       Right eye: No discharge.        Left eye: No discharge.     Conjunctiva/sclera: Conjunctivae normal.  Neck:     Trachea: No tracheal deviation.  Cardiovascular:     Rate and Rhythm: Normal rate and regular rhythm.  Pulmonary:     Effort: Pulmonary effort is normal. No respiratory distress.     Breath sounds: Normal breath sounds. No stridor. No wheezing or rales.  Abdominal:     General: Bowel sounds are normal. There is no distension.     Palpations: Abdomen is soft.     Tenderness: There is no abdominal tenderness. There is no guarding or rebound.  Musculoskeletal:  General: No tenderness or deformity.     Cervical back: Neck supple.  Skin:    General: Skin is warm and dry.     Findings: No rash.  Neurological:     General: No focal deficit present.     Mental Status: He is alert.     Cranial Nerves: No cranial nerve deficit (no facial droop, extraocular movements intact, no slurred speech).     Sensory: No sensory deficit.     Motor: No abnormal muscle tone or seizure activity.     Coordination: Coordination normal.      Comments: Patient able to move all extremities,  Psychiatric:        Mood and Affect: Mood normal.    ED Results / Procedures / Treatments   Labs (all labs ordered are listed, but only abnormal results are displayed) Labs Reviewed  RESP PANEL BY RT-PCR (FLU A&B, COVID) ARPGX2 - Abnormal; Notable for the following components:      Result Value   Influenza A by PCR POSITIVE (*)    All other components within normal limits  COMPREHENSIVE METABOLIC PANEL - Abnormal; Notable for the following components:   Potassium 3.4 (*)    Glucose, Bld 108 (*)    Calcium 8.8 (*)    Total Protein 5.6 (*)    Albumin 3.4 (*)    All other components within normal limits  CBC WITH DIFFERENTIAL/PLATELET - Abnormal; Notable for the following components:   RBC 4.18 (*)    Hemoglobin 12.7 (*)    Platelets 125 (*)    Lymphs Abs 0.3 (*)    All other components within normal limits  CULTURE, BLOOD (ROUTINE X 2)  CULTURE, BLOOD (ROUTINE X 2)  URINE CULTURE  LACTIC ACID, PLASMA  PROTIME-INR  APTT  LACTIC ACID, PLASMA  URINALYSIS, ROUTINE W REFLEX MICROSCOPIC    EKG EKG Interpretation  Date/Time:  Friday August 23 2021 07:55:29 EST Ventricular Rate:  80 PR Interval:  213 QRS Duration: 101 QT Interval:  424 QTC Calculation: 490 R Axis:   52 Text Interpretation: Sinus rhythm RSR' in V1 or V2, right VCD or RVH Borderline ST elevation, anterior leads , new since last tracing Borderline prolonged QT interval Confirmed by Dorie Rank [phone]) on 08/23/2021 10:19:36 AM  Radiology DG Chest Port 1 View  Result Date: 08/23/2021 CLINICAL DATA:  Concern for sepsis.  Cough and congestion. EXAM: PORTABLE CHEST 1 VIEW COMPARISON:  02/28/2021 FINDINGS: Chronic asymmetric elevation of left hemidiaphragm, which obscures the left heart border. Opacity overlying the elevated left hemidiaphragm may represent pneumonia and/or atelectasis. Right lung appears clear. No signs of pleural effusion or interstitial  edema. IMPRESSION: 1. Chronic asymmetric elevation of left hemidiaphragm. 2. Opacity overlying the left hemidiaphragm may represent pneumonia and/or atelectasis. Electronically Signed   By: Kerby Moors M.D.   On: 08/23/2021 08:32    Procedures Procedures   Medications Ordered in ED Medications  lactated ringers infusion ( Intravenous New Bag/Given 08/23/21 0915)  cefTRIAXone (ROCEPHIN) 2 g in sodium chloride 0.9 % 100 mL IVPB (0 g Intravenous Stopped 08/23/21 0909)  azithromycin (ZITHROMAX) 500 mg in sodium chloride 0.9 % 250 mL IVPB (500 mg Intravenous New Bag/Given 08/23/21 0914)  lactated ringers bolus 1,000 mL (0 mLs Intravenous Stopped 08/23/21 1002)    And  lactated ringers bolus 1,000 mL (1,000 mLs Intravenous New Bag/Given 08/23/21 0832)    And  lactated ringers bolus 1,000 mL (0 mLs Intravenous Stopped 08/23/21 1002)    And  lactated ringers bolus 500 mL (500 mLs Intravenous New Bag/Given 08/23/21 0915)    ED Course  I have reviewed the triage vital signs and the nursing notes.  Pertinent labs & imaging results that were available during my care of the patient were reviewed by me and considered in my medical decision making (see chart for details).  Clinical Course as of 08/23/21 1019  Fri Aug 23, 2021  0814 Noted to be hypotensive at the bedside.  Patient does have an oxygen requirement but in the mid 90s now on supplemental nasal cannula oxygen [JK]  0942 Influenza test is positive. [JK]  3220 Metabolic panel unremarkable [JK]  0943 CBC normal [JK]  0943 Chest x-ray suggest possible pneumonia [JK]  0943 Initial lactic acid level normal. [JK]  0943 Blood pressure has improved to 118/99. [JK]  1006 Oxygenation saturation is in the mid 90s on 2 L nasal cannula [JK]    Clinical Course User Index [JK] Dorie Rank, MD   MDM Rules/Calculators/A&P                         Medical Decision Making Problems Addressed: Community acquired pneumonia, unspecified laterality:  undiagnosed new problem with uncertain prognosis    Details: Patient with PNa noted on xray. Hypoxia: acute illness or injury    Details: related to pna Influenza: acute illness or injury Near syncope: acute illness or injury  Amount and/or Complexity of Data Reviewed Independent Historian: spouse    Details: Additional history provided by patient's spouse as he does have a history of Dementia Labs: ordered. Decision-making details documented in ED Course. Radiology: ordered. Decision-making details documented in ED Course. ECG/medicine tests: ordered. Decision-making details documented in ED Course.  Risk Decision regarding hospitalization.   Patient presented with syncopal episode.  Initially noted to be hypotensive but has improved with IV fluids.  No signs of severe sepsis based on initial lactic acid level and response to treatment.  Patient started on empiric antibiotics for pneumonia.  Does have a new oxygen requirement.  Patient does have some EKG changes compared to prior EKGs.  Patient not complaining of chest pain.  Right now.  We will add on serial troponins.  Patient also history of myasthenia gravis and at risk for complications associated with this illness.  I will consult medical service for admission.    Final Clinical Impression(s) / ED Diagnoses Final diagnoses:  Community acquired pneumonia, unspecified laterality  Hypoxia  Near syncope  Influenza     Dorie Rank, MD 08/23/21 1020

## 2021-08-23 NOTE — Assessment & Plan Note (Addendum)
-  Patient with increased cough and congestion, wife thought it was a URI -Found to have influenza A -Supportive care -Tamiflu BID -IVF at 100 cc/hr -Also with ?LLL PNA  -Given his immunocompromise and difficulty with MG, will cover with Rocephin/Azithromycin -Blood cultures pending -Will admit to telemetry -Has a new O2 requirement, continue [place] O2 for now and attempt to wean as tolerated

## 2021-08-23 NOTE — Assessment & Plan Note (Signed)
-  No longer on CPAP

## 2021-08-23 NOTE — Plan of Care (Signed)
°  Problem: Respiratory: Goal: Ability to maintain adequate ventilation will improve Outcome: Progressing   Problem: Clinical Measurements: Goal: Diagnostic test results will improve Outcome: Progressing Goal: Respiratory complications will improve Outcome: Progressing Goal: Cardiovascular complication will be avoided Outcome: Progressing   Problem: Activity: Goal: Risk for activity intolerance will decrease Outcome: Progressing   Problem: Coping: Goal: Level of anxiety will decrease Outcome: Progressing   Problem: Pain Managment: Goal: General experience of comfort will improve Outcome: Progressing   Problem: Safety: Goal: Ability to remain free from injury will improve Outcome: Progressing

## 2021-08-23 NOTE — ED Notes (Signed)
Primofit was placed on pt

## 2021-08-23 NOTE — Assessment & Plan Note (Addendum)
-  Continue Mestinon, Cellcept -Takes prednisone 10 mg QOD; will increase to 20 mg daily for now due to presumed acute stress reaction

## 2021-08-23 NOTE — ED Triage Notes (Signed)
Patient has been sick with cough and congestion for the past few days taking mucinex, wife reports near syncope with lethargy. Also complaining of chest pain.  Initial oxygen 84%RA placed on 2L [place].  +orthostatics with EMS.

## 2021-08-23 NOTE — Assessment & Plan Note (Addendum)
-  Continue Ativan, Aricept, Namenda, Zoloft -Will order delirium precautions -Will add prn IV Haldol, as he has a h/o sundowning -Will add telesitter, as his wife is unlikely to stay overnight unless truly needed

## 2021-08-23 NOTE — Assessment & Plan Note (Signed)
-  I have discussed code status with the patient's wife and the patient would not desire resuscitation and would prefer to die a natural death should that situation arise. -He will need a gold out of facility DNR form at the time of discharge

## 2021-08-23 NOTE — Sepsis Progress Note (Signed)
eLink is following this Code Sepsis. °

## 2021-08-23 NOTE — Assessment & Plan Note (Addendum)
-  Grade 1 diastolic dysfunction on echo in 2019 -Appears compensated at this time -Hold Lasix in the setting of acute infection -Needs to be monitored while on IVF

## 2021-08-23 NOTE — H&P (Signed)
History and Physical    Patient: [name] FGH:[id number] DOB: 06/23/1941 DOA: 08/23/2021 DOS: the patient was seen and examined on 08/23/2021 PCP: [name], [name], MD  Patient coming from: Home - lives with wife; [name], wife, [phone]  Chief Complaint: Cogh/congestion  HPI: [name] is a 80 y.o. male with medical history significant of anemia requiring transfusion; myasthenia gravis; dementia; BPH; HTN; HLD; and OSA not on CPAP presenting with cough/congestion. His wife provided the history.  They were out of town for the holiday - took a [place] bus tour trip to [place].  It was a busy trip and he seemed to be fine.  Wednesday night, he developed a cough about bedtime and it was worse yesterday.  He had a bad night.  She thought he just had a bad cold.  She has not thought he had fever.  They did have flu shots and have had 2 COVID boosters.  He does have some sundowning behavior.   ER Course:  Influenza, PNA, syncope.  Weakness for a few days, near syncope this AM.  BP soft but better now.  96% on 2L, in 80s on RA.  WBC ok, troponin ok and will check delta (? Report of CP with EMS).   CXR with infiltrate.  Started on abx, Tamiflu.   Review of Systems: unable to perform  Past Medical History:  Diagnosis Date   Anemia    Blood transfusion without reported diagnosis 02/23/2014   3 units for iron deficiency anemia. as a baby - whenhe had pneumonia   BPH (benign prostatic hyperplasia)    CTS (carpal tunnel syndrome)    better   Dementia (HCC)    Dyspnea    GERD (gastroesophageal reflux disease)    Hyperlipidemia    Hypertension    "THEY DIAGNOSED ME YEARS AGO BUT LATELY IVE ACTUALLY HAD LOW BLOOD PRESSURES "    Insomnia    LBP (low back pain)    Myasthenia gravis ([place])    Osteoarthritis    Sleep apnea    no cpap   Past Surgical History:  Procedure Laterality Date   CARDIAC CATHETERIZATION N/A 08/15/2015   Procedure: Left Heart Cath and Coronary  Angiography;  Surgeon: Lorretta Harp, MD;  Location: [facility];  Service: Cardiovascular;  Laterality: N/A;   CARPAL TUNNEL RELEASE Bilateral 1996, 2004   Dr Lenoard Aden thumb surgery.  more than once   COLONOSCOPY  2005, 2012   diverticulosis.    ESOPHAGOGASTRODUODENOSCOPY N/A 02/23/2014   Procedure: ESOPHAGOGASTRODUODENOSCOPY (EGD);  Surgeon: Irene Shipper, MD;  Location: [facility];  Service: Endoscopy;  Laterality: N/A;   EYE SURGERY  2012   both cataracts, Lasik   LEFT KNEE ARTHROSCOPY Left 07/16/2017    [facility]    LUMBAR FUSION     x 3   REVERSE SHOULDER ARTHROPLASTY Left 07/04/2016   Procedure: LEFT REVERSE SHOULDER ARTHROPLASTY;  Surgeon: Netta Cedars, MD;  Location: [facility];  Service: Orthopedics;  Laterality: Left;   TONSILLECTOMY  1946   TOTAL KNEE ARTHROPLASTY  2003   Right   TOTAL KNEE ARTHROPLASTY Left 09/25/2017   Procedure: LEFT TOTAL KNEE ARTHROPLASTY;  Surgeon: Sydnee Cabal, MD;  Location: [facility];  Service: Orthopedics;  Laterality: Left;   Social History:  reports that he quit smoking about 34 years ago. His smoking use included cigarettes. He has never used smokeless tobacco. He reports current alcohol use. He reports that he does not use drugs.  Allergies  Allergen Reactions   Ferumoxytol Other (See Comments)    Stroke like symptoms; has hx of MG as well     Family History  Problem Relation Age of Onset   Aneurysm Father        AAA   Heart disease Father 39       CHF   Alzheimer's disease Father    Cancer Brother    Alzheimer's disease Brother        dementia   Stomach cancer Brother    Cancer Mother 9       breast cancer   Heart disease Mother 81       MI   Autoimmune disease Son    CAD Brother    Colon cancer Neg Hx    Esophageal cancer Neg Hx    Pancreatic cancer Neg Hx    Prostate cancer Neg Hx    Rectal cancer Neg Hx     Prior to Admission medications   Medication Sig Start Date End Date Taking? Authorizing Provider   Cholecalciferol (VITAMIN D3) 2000 units TABS Take 2,000 Units by mouth at bedtime.   Yes [provider]  Coenzyme Q10 (CO Q-10 PO) Take 1 tablet by mouth at bedtime.   Yes [provider]  Cyanocobalamin (VITAMIN B-12) 1000 MCG SUBL Place 1 tablet (1,000 mcg total) under the tongue daily. Patient taking differently: Place 1,000 mcg under the tongue at bedtime. 02/21/14  Yes [name], [name], MD  donepezil (ARICEPT) 10 MG tablet TAKE 1 TABLET BY MOUTH EVERYDAY AT BEDTIME Patient taking differently: Take 10 mg by mouth at bedtime. 10/24/20  Yes [name], [name], MD  famotidine (PEPCID) 40 MG tablet Take 1 tablet by mouth at bedtime Patient taking differently: Take 40 mg by mouth at bedtime. 05/23/21  Yes [name], [name], MD  finasteride (PROSCAR) 5 MG tablet Take 1 tablet (5 mg total) by mouth daily. 08/16/21  Yes [name], [name], MD  furosemide (LASIX) 20 MG tablet TAKE 1 TO 2 TABLETS BY  MOUTH DAILY IN THE EVENING Patient taking differently: Take 20 mg by mouth every evening. 03/01/21  Yes [name], [name], MD  LORazepam (ATIVAN) 1 MG tablet Take 1 tablet (1 mg total) by mouth 2 (two) times daily as needed for anxiety. Patient taking differently: Take 1 mg by mouth daily as needed for anxiety. 04/04/21  Yes [name], [name], MD  memantine (NAMENDA) 10 MG tablet Take 1 tablet (10 mg total) by mouth 2 (two) times daily. 07/08/21  Yes [name], [name], MD  mycophenolate (CELLCEPT) 250 MG capsule Take 500 mg by mouth 2 (two) times daily. 04/24/21  Yes [provider]  potassium chloride (KLOR-CON) 10 MEQ tablet Take 1 tablet (10 mEq total) by mouth 2 (two) times daily. 12/03/20   [name], [name], MD  predniSONE (DELTASONE) 10 MG tablet 25 mg qod 20 mg qod Patient taking differently: Take 10 mg by mouth daily. 10mg  every other day 01/13/19   [name], [name], MD  pyridostigmine (MESTINON) 60 MG tablet One in AM , one at lunch, 2 at  bedtime. Patient taking differently: One in AM , one in PM, and 1 at bedtime if he need 04/12/18   Dohmeier, Asencion Partridge, MD  sertraline (ZOLOFT) 100 MG tablet TAKE 1 TABLET BY MOUTH EVERY DAY Patient taking differently: Take 100 mg by mouth daily. 11/19/20   [name], [name], MD  tamsulosin (FLOMAX) 0.4 MG CAPS capsule Take 2 capsules by mouth every day Patient  taking differently: Take 0.8 mg by mouth daily. 04/15/21   [name], [name], MD  vitamin C (ASCORBIC ACID) 500 MG tablet Take 500 mg by mouth daily.    [provider]    Physical Exam: Vitals:   08/23/21 1045 08/23/21 1130 08/23/21 1215 08/23/21 1245  BP: 124/70 122/61 113/61 108/68  Pulse: 73 76 71 72  Resp: (!) 32 20 (!) 28 (!) 27  Temp:      TempSrc:      SpO2: 94% 95% 95% 96%  Weight:      Height:       General:  Appears calm and comfortable and is in NAD; his is sedated from Ativan that she gave him in the early morning hours Eyes:  normal lids, iris ENT:  grossly normal lips & tongue, mmm Neck:  no LAD, masses or thyromegaly Cardiovascular:  RRR, no m/r/g. No LE edema.  Respiratory:   CTA bilaterally with scant scattered rhonchi.  Mildly increased respiratory effort. Abdomen:  soft, NT, ND Skin:  no rash or induration seen on limited exam Musculoskeletal:   no bony abnormality Psychiatric:  somnolent mood and affect Neurologic:  unable to perform   Radiological Exams on Admission: Independently reviewed - see discussion in A/P where applicable  DG Chest Port 1 View  Result Date: 08/23/2021 CLINICAL DATA:  Concern for sepsis.  Cough and congestion. EXAM: PORTABLE CHEST 1 VIEW COMPARISON:  02/28/2021 FINDINGS: Chronic asymmetric elevation of left hemidiaphragm, which obscures the left heart border. Opacity overlying the elevated left hemidiaphragm may represent pneumonia and/or atelectasis. Right lung appears clear. No signs of pleural effusion or interstitial edema. IMPRESSION: 1. Chronic asymmetric  elevation of left hemidiaphragm. 2. Opacity overlying the left hemidiaphragm may represent pneumonia and/or atelectasis. Electronically Signed   By: Kerby Moors M.D.   On: 08/23/2021 08:32    EKG: Independently reviewed.  NSR with rate 80; nonspecific ST changes that appear to be new from prior   Labs on Admission: I have personally reviewed the available labs and imaging studies at the time of the admission.  Pertinent labs:    Unremarkable CMP HS troponin 14 Unremarkable CBC Influenza A POSITIVE    Assessment/Plan * Pneumonia and influenza- (present on admission) -Patient with increased cough and congestion, wife thought it was a URI -Found to have influenza A -Supportive care -Tamiflu BID -IVF at 100 cc/hr -Also with ?LLL PNA  -Given his immunocompromise and difficulty with MG, will cover with Rocephin/Azithromycin -Blood cultures pending -Will admit to telemetry -Has a new O2 requirement, continue [place] O2 for now and attempt to wean as tolerated  DNR (do not resuscitate)- (present on admission) -I have discussed code status with the patient's wife and the patient would not desire resuscitation and would prefer to die a natural death should that situation arise. -He will need a gold out of facility DNR form at the time of discharge  Chronic diastolic CHF (congestive heart failure) ([place])- (present on admission) -Grade 1 diastolic dysfunction on echo in 2019 -Appears compensated at this time -Hold Lasix in the setting of acute infection -Needs to be monitored while on IVF  Alzheimer disease ([place])- (present on admission) -Continue Ativan, Aricept, Namenda, Zoloft -Will order delirium precautions -Will add prn IV Haldol, as he has a h/o sundowning -Will add telesitter, as his wife is unlikely to stay overnight unless truly needed  Myasthenia gravis, AChR antibody positive ([place])- (present on admission) -Continue Mestinon, Cellcept -Takes prednisone 10 mg QOD; will  increase to 20 mg daily for now due to presumed acute stress reaction  OSA on CPAP -No longer on CPAP  BPH without obstruction/lower urinary tract symptoms- (present on admission) -Continue Flomax, Proscar    Advance Care Planning:   Code Status: DNR   Consults: None  Family Communication: Wife was present throughout  Severity of Illness: The appropriate patient status for this patient is INPATIENT. Inpatient status is judged to be reasonable and necessary in order to provide the required intensity of service to ensure the patient's safety. The patient's presenting symptoms, physical exam findings, and initial radiographic and laboratory data in the context of their chronic comorbidities is felt to place them at high risk for further clinical deterioration. Furthermore, it is not anticipated that the patient will be medically stable for discharge from the hospital within 2 midnights of admission.   * I certify that at the point of admission it is my clinical judgment that the patient will require inpatient hospital care spanning beyond 2 midnights from the point of admission due to high intensity of service, high risk for further deterioration and high frequency of surveillance required.*  Author: Karmen Bongo 08/23/2021 1:25 PM  For on call review [email or web address].

## 2021-08-24 DIAGNOSIS — I10 Essential (primary) hypertension: Secondary | ICD-10-CM

## 2021-08-24 DIAGNOSIS — I2510 Atherosclerotic heart disease of native coronary artery without angina pectoris: Secondary | ICD-10-CM

## 2021-08-24 LAB — CBC
HCT: 36.8 % — ABNORMAL LOW (ref 39.0–52.0)
Hemoglobin: 11.5 g/dL — ABNORMAL LOW (ref 13.0–17.0)
MCH: 29.8 pg (ref 26.0–34.0)
MCHC: 31.3 g/dL (ref 30.0–36.0)
MCV: 95.3 fL (ref 80.0–100.0)
Platelets: 112 10*3/uL — ABNORMAL LOW (ref 150–400)
RBC: 3.86 MIL/uL — ABNORMAL LOW (ref 4.22–5.81)
RDW: 13.7 % (ref 11.5–15.5)
WBC: 5.7 10*3/uL (ref 4.0–10.5)
nRBC: 0 % (ref 0.0–0.2)

## 2021-08-24 LAB — BASIC METABOLIC PANEL
Anion gap: 8 (ref 5–15)
BUN: 16 mg/dL (ref 8–23)
CO2: 24 mmol/L (ref 22–32)
Calcium: 8.6 mg/dL — ABNORMAL LOW (ref 8.9–10.3)
Chloride: 106 mmol/L (ref 98–111)
Creatinine, Ser: 0.65 mg/dL (ref 0.61–1.24)
GFR, Estimated: >60 mL/min (ref >60)
Glucose, Bld: 82 mg/dL (ref 70–99)
Potassium: 3.7 mmol/L (ref 3.5–5.1)
Sodium: 138 mmol/L (ref 135–145)

## 2021-08-24 MED ORDER — FUROSEMIDE 20 MG PO TABS: 20.0000 mg | ORAL_TABLET | Freq: Every evening | ORAL | Status: DC

## 2021-08-24 NOTE — Evaluation (Signed)
Occupational Therapy Evaluation Patient Details Name: [name] MRN: [id number] DOB: [birth year] Today's Date: 08/24/2021   History of Present Illness Pt is a 80 yr old male who presented due to increase in cough and congestion following a trip to [place].  Pt was admitted due to influenza and near syncope. PMH: anemia requiring transfusion; myasthenia gravis; dementia, BPH; HTN, HLD, OSA, L TKA, lumbar fusion, L total reverse total sx   Clinical Impression   Pt presented with no family present. Pt was able to complete ADLs while sitting to standing at EOB with set up to min guard. Pt was able to complete transfers from elevated surface to RW with min guard and able to complete some room level of functional ambulation for ADLS while on 3L via [place] while remaining in 95-97%. Pt currently with functional limitations due to the deficits listed below (see OT Problem List).  Pt will benefit from skilled OT to increase their safety and independence with ADL and functional mobility for ADL to facilitate discharge to venue listed below.        Recommendations for follow up therapy are one component of a multi-disciplinary discharge planning process, led by the attending physician.  Recommendations may be updated based on patient status, additional functional criteria and insurance authorization.   Follow Up Recommendations  Home health OT    Assistance Recommended at Discharge Frequent or constant Supervision/Assistance  Functional Status Assessment  Patient has had a recent decline in their functional status and demonstrates the ability to make significant improvements in function in a reasonable and predictable amount of time.  Equipment Recommendations   (FW)    Recommendations for Other Services       Precautions / Restrictions Precautions Precautions: Fall      Mobility Bed Mobility Overal bed mobility: Needs Assistance Bed Mobility: Supine to Sit;Sit to Supine     Supine  to sit: Min guard;HOB elevated Sit to supine: Min guard;HOB elevated        Transfers Overall transfer level: Needs assistance Equipment used: Rolling walker (2 wheels) Transfers: Sit to/from Stand Sit to Stand: Min guard;From elevated surface           General transfer comment: cued for positioning and pacing self      Balance Overall balance assessment: Needs assistance Sitting-balance support: Feet supported Sitting balance-Leahy Scale: Good     Standing balance support: Bilateral upper extremity supported Standing balance-Leahy Scale: Fair Standing balance comment: was able to complete some unspportive standing and reaching                           ADL either performed or assessed with clinical judgement   ADL Overall ADL's : Needs assistance/impaired Eating/Feeding: Set up;Cueing for safety;Cueing for sequencing;Sitting   Grooming: Wash/dry hands;Wash/dry face;Min guard;Cueing for safety;Cueing for sequencing;Standing;Sitting   Upper Body Bathing: Min guard;Cueing for safety;Cueing for sequencing;Sitting   Lower Body Bathing: Min guard;Cueing for safety;Cueing for sequencing;Sit to/from stand   Upper Body Dressing : Min guard;Sitting;Standing   Lower Body Dressing: Min guard;Sit to/from stand   Toilet Transfer: Min guard;Cueing for safety;Cueing for sequencing;Rolling walker (2 wheels)   Toileting- Water quality scientist and Hygiene: Min guard;Sit to/from stand   Tub/ Shower Transfer: Minimal assistance;Cueing for safety;Cueing for sequencing;Shower seat;Rolling walker (2 wheels)   Functional mobility during ADLs: Min guard;Cueing for safety;Cueing for sequencing;Rolling walker (2 wheels)       Vision  Perception     Praxis      Pertinent Vitals/Pain Pain Assessment: No/denies pain     Hand Dominance Right   Extremity/Trunk Assessment Upper Extremity Assessment Upper Extremity Assessment: Overall WFL for tasks assessed    Lower Extremity Assessment Lower Extremity Assessment: Defer to PT evaluation   Cervical / Trunk Assessment Cervical / Trunk Assessment: Kyphotic   Communication Communication Communication: HOH;Expressive difficulties   Cognition Arousal/Alertness: Awake/alert Behavior During Therapy: WFL for tasks assessed/performed Overall Cognitive Status: History of cognitive impairments - at baseline                                 General Comments: Pt was able to report name and thought they were in [facility] or [place]     General Comments       Exercises     Shoulder Instructions      Home Living Family/patient expects to be discharged to:: Private residence Living Arrangements: Spouse/significant other Available Help at Discharge: Family   Home Access: Level entry     Home Layout: Two level;Able to live on main level with bedroom/bathroom     Bathroom Shower/Tub: Walk-in shower         Home Equipment: Conservation officer, nature (2 wheels);Cane - single point;Shower seat;Grab bars - tub/shower   Additional Comments: information obtained in chart as wife not present at the time and pt unable to self report.      Prior Functioning/Environment Prior Level of Function : Needs assist  Cognitive Assist : Mobility (cognitive);ADLs (cognitive)             ADLs Comments: Pt has wife assistance at home        OT Problem List: Impaired balance (sitting and/or standing);Decreased cognition;Decreased safety awareness;Cardiopulmonary status limiting activity      OT Treatment/Interventions: Self-care/ADL training;Therapeutic exercise;Energy conservation;DME and/or AE instruction;Therapeutic activities;Patient/family education;Balance training    OT Goals(Current goals can be found in the care plan section) Acute Rehab OT Goals Patient Stated Goal: none stated OT Goal Formulation: Patient unable to participate in goal setting Time For Goal Achievement: 09/07/21 Potential to  Achieve Goals: Good  OT Frequency: Min 2X/week   Barriers to D/C:            Co-evaluation              AM-PAC OT "6 Clicks" Daily Activity     Outcome Measure Help from another person eating meals?: A Little Help from another person taking care of personal grooming?: A Little Help from another person toileting, which includes using toliet, bedpan, or urinal?: A Little Help from another person bathing (including washing, rinsing, drying)?: A Little Help from another person to put on and taking off regular upper body clothing?: A Little Help from another person to put on and taking off regular lower body clothing?: A Little 6 Click Score: 18   End of Session Equipment Utilized During Treatment: Gait belt;Rolling walker (2 wheels) Nurse Communication: Mobility status  Activity Tolerance: Patient tolerated treatment well Patient left: in bed;with call bell/phone within reach;with bed alarm set  OT Visit Diagnosis: Unsteadiness on feet (R26.81);Other abnormalities of gait and mobility (R26.89);Muscle weakness (generalized) (M62.81)                Time: 1962-2297 OT Time Calculation (min): 31 min Charges:  OT General Charges $OT Visit: 1 Visit OT Evaluation $OT Eval Low Complexity: 1 Low OT Treatments $Self  Care/Home Management : 8-22 mins  Joeseph Amor OTR/L  Acute Rehab Services  [phone] office number [phone] pager number   Joeseph Amor 08/24/2021, 11:43 AM

## 2021-08-24 NOTE — Plan of Care (Signed)
°  Problem: Respiratory: Goal: Ability to maintain adequate ventilation will improve Outcome: Progressing Goal: Ability to maintain a clear airway will improve Outcome: Progressing   Problem: Clinical Measurements: Goal: Respiratory complications will improve Outcome: Progressing Goal: Cardiovascular complication will be avoided Outcome: Progressing   Problem: Coping: Goal: Level of anxiety will decrease Outcome: Progressing   Problem: Safety: Goal: Ability to remain free from injury will improve Outcome: Progressing

## 2021-08-24 NOTE — Progress Notes (Signed)
Progress Note    [name]  JSE:[id number] DOB: 05-Dec-1940  DOA: 08/23/2021 PCP: Cassandria Anger, MD    Brief Narrative:     Medical records reviewed and are as summarized below:  [name] is an 80 y.o. male with medical history significant of anemia requiring transfusion; myasthenia gravis; dementia; BPH; HTN; HLD; and OSA not on CPAP presenting with cough/congestion. His wife provided the history.  They were out of town for the holiday - took a [place] bus tour trip to [place].  It was a busy trip and he seemed to be fine.  Wednesday night, he developed a cough about bedtime and it was worse yesterday.  Found to have a PNA and influenza A.    Assessment/Plan:   Principal Problem:   Pneumonia and influenza Active Problems:   BPH without obstruction/lower urinary tract symptoms   OSA on CPAP   Myasthenia gravis, AChR antibody positive (HCC)   Alzheimer disease (HCC)   Chronic diastolic CHF (congestive heart failure) ([place])   DNR (do not resuscitate)   Pneumonia and influenza- (present on admission) -Patient with increased cough and congestion, wife thought it was a URI -Found to have influenza A -Supportive care -Tamiflu BID -Also with ?LLL PNA  -Given his immunocompromise and difficulty with MG, will cover with Rocephin/Azithromycin -Blood cultures pending -Has a new O2 requirement- wean to off as able   DNR (do not resuscitate)- (present on admission) -I have discussed code status with the patient's wife and the patient would not desire resuscitation and would prefer to die a natural death should that situation arise. -He will need a gold out of facility DNR form at the time of discharge   Chronic diastolic CHF (congestive heart failure) ([place])- (present on admission) -Grade 1 diastolic dysfunction on echo in 2019 -Appears compensated at this time -d/c IVF -restart lasix in AM   Alzheimer disease ([place])- (present on admission) -Continue  Ativan, Aricept, Namenda, Zoloft -Will order delirium precautions -prn IV Haldol, as he has a h/o sundowning -telesitter, as his wife is unlikely to stay overnight unless truly needed   Myasthenia gravis, AChR antibody positive ([place])- (present on admission) -Continue Mestinon, Cellcept -Takes prednisone 10 mg QOD; will increase to 20 mg daily for now due to presumed acute stress reaction -wean back to prior dose over next week or so   OSA on CPAP -No longer on CPAP   BPH without obstruction/lower urinary tract symptoms- (present on admission) -Continue Flomax, Proscar   obesity Body mass index is 33.97 kg/m.   Family Communication/Anticipated D/C date and plan/Code Status   DVT prophylaxis: Lovenox ordered. Code Status: DNR Disposition Plan: Status is: Inpatient  Remains inpatient appropriate because: needs weaned off O2         Medical Consultants:   None.     Subjective:   Asking about going home  Objective:    Vitals:   08/24/21 0416 08/24/21 0725 08/24/21 1035 08/24/21 1300  BP: 130/74 136/83 121/69   Pulse: (!) 59 66 60   Resp: 20 20 20    Temp: 98.2 F (36.8 C) 98.3 F (36.8 C) 98.3 F (36.8 C)   TempSrc: Oral Oral Oral   SpO2: 92% 96% 95% 92%  Weight: 113.6 kg     Height:        Intake/Output Summary (Last 24 hours) at 08/24/2021 1328 Last data filed at 08/24/2021 1206 Gross per 24 hour  Intake 1454.7 ml  Output 300 ml  Net 1154.7 ml   Filed Weights   08/23/21 0752 08/23/21 1514 08/24/21 0416  Weight: 106.6 kg 112.5 kg 113.6 kg    Exam:  General: Appearance:    Obese male in no acute distress     Lungs:    respirations unlabored  Heart:    Normal heart rate.    MS:   All extremities are intact.    Neurologic:   Awake, alert     Data Reviewed:   I have personally reviewed following labs and imaging studies:  Labs: Labs show the following:   Basic Metabolic Panel: Recent Labs  Lab 08/23/21 0815 08/24/21 0409  NA  135 138  K 3.4* 3.7  CL 103 106  CO2 25 24  GLUCOSE 108* 82  BUN 20 16  CREATININE 0.87 0.65  CALCIUM 8.8* 8.6*   GFR Estimated Creatinine Clearance: 95.8 mL/min (by C-G formula based on SCr of 0.65 mg/dL). Liver Function Tests: Recent Labs  Lab 08/23/21 0815  AST 19  ALT 17  ALKPHOS 65  BILITOT 1.0  PROT 5.6*  ALBUMIN 3.4*   No results for input(s): LIPASE, AMYLASE in the last 168 hours. No results for input(s): AMMONIA in the last 168 hours. Coagulation profile Recent Labs  Lab 08/23/21 0815  INR 1.1    CBC: Recent Labs  Lab 08/23/21 0815 08/24/21 0409  WBC 6.5 5.7  NEUTROABS 5.6  --   HGB 12.7* 11.5*  HCT 39.9 36.8*  MCV 95.5 95.3  PLT 125* 112*   Cardiac Enzymes: No results for input(s): CKTOTAL, CKMB, CKMBINDEX, TROPONINI in the last 168 hours. BNP (last 3 results) No results for input(s): PROBNP in the last 8760 hours. CBG: No results for input(s): GLUCAP in the last 168 hours. D-Dimer: No results for input(s): DDIMER in the last 72 hours. Hgb A1c: No results for input(s): HGBA1C in the last 72 hours. Lipid Profile: No results for input(s): CHOL, HDL, LDLCALC, TRIG, CHOLHDL, LDLDIRECT in the last 72 hours. Thyroid function studies: No results for input(s): TSH, T4TOTAL, T3FREE, THYROIDAB in the last 72 hours.  Invalid input(s): FREET3 Anemia work up: No results for input(s): VITAMINB12, FOLATE, FERRITIN, TIBC, IRON, RETICCTPCT in the last 72 hours. Sepsis Labs: Recent Labs  Lab 08/23/21 0815 08/24/21 0409  WBC 6.5 5.7  LATICACIDVEN 1.5  --     Microbiology Recent Results (from the past 240 hour(s))  Culture, blood (Routine x 2)     Status: None (Preliminary result)   Collection Time: 08/23/21  7:57 AM   Specimen: BLOOD RIGHT WRIST  Result Value Ref Range Status   Specimen Description BLOOD RIGHT WRIST  Final   Special Requests   Final    BOTTLES DRAWN AEROBIC AND ANAEROBIC Blood Culture results may not be optimal due to an inadequate  volume of blood received in culture bottles   Culture   Final    NO GROWTH 1 DAY Performed at [facility], [place] [address], [place], [place] [address]    Report Status PENDING  Incomplete  Culture, blood (Routine x 2)     Status: None (Preliminary result)   Collection Time: 08/23/21  8:02 AM   Specimen: BLOOD RIGHT HAND  Result Value Ref Range Status   Specimen Description BLOOD RIGHT HAND  Final   Special Requests   Final    BOTTLES DRAWN AEROBIC AND ANAEROBIC Blood Culture adequate volume   Culture   Final    NO GROWTH 1 DAY Performed at [facility], [place] [address] [place], [place] [address]    Report Status PENDING  Incomplete  Resp Panel by RT-PCR (Flu A&B, Covid)     Status: Abnormal   Collection Time: 08/23/21  8:15 AM   Specimen: Nasopharyngeal(NP) swabs in vial transport medium  Result Value Ref Range Status   SARS Coronavirus 2 by RT PCR NEGATIVE NEGATIVE Final    Comment: (NOTE) SARS-CoV-2 target nucleic acids are NOT DETECTED.  The SARS-CoV-2 RNA is generally detectable in upper respiratory specimens during the acute phase of infection. The lowest concentration of SARS-CoV-2 viral copies this assay can detect is 138 copies/mL. A negative result does not preclude SARS-Cov-2 infection and should not be used as the sole basis for treatment or other patient management decisions. A negative result may occur with  improper specimen collection/handling, submission of specimen other than nasopharyngeal swab, presence of viral mutation(s) within the areas targeted by this assay, and inadequate number of viral copies(<138 copies/mL). A negative result must be combined with clinical observations, patient history, and epidemiological information. The expected result is Negative.  Fact Sheet for Patients:  EntrepreneurPulse.com.au  Fact Sheet for Healthcare Providers:  IncredibleEmployment.be  This test is no t yet approved  or cleared by the [place] FDA and  has been authorized for detection and/or diagnosis of SARS-CoV-2 by FDA under an Emergency Use Authorization (EUA). This EUA will remain  in effect (meaning this test can be used) for the duration of the COVID-19 declaration under Section 564(b)(1) of the Act, 21 U.S.C.section 360bbb-3(b)(1), unless the authorization is terminated  or revoked sooner.       Influenza A by PCR POSITIVE (A) NEGATIVE Final   Influenza B by PCR NEGATIVE NEGATIVE Final    Comment: (NOTE) The Xpert Xpress SARS-CoV-2/FLU/RSV plus assay is intended as an aid in the diagnosis of influenza from Nasopharyngeal swab specimens and should not be used as a sole basis for treatment. Nasal washings and aspirates are unacceptable for Xpert Xpress SARS-CoV-2/FLU/RSV testing.  Fact Sheet for Patients: EntrepreneurPulse.com.au  Fact Sheet for Healthcare Providers: IncredibleEmployment.be  This test is not yet approved or cleared by the [place] FDA and has been authorized for detection and/or diagnosis of SARS-CoV-2 by FDA under an Emergency Use Authorization (EUA). This EUA will remain in effect (meaning this test can be used) for the duration of the COVID-19 declaration under Section 564(b)(1) of the Act, 21 U.S.C. section 360bbb-3(b)(1), unless the authorization is terminated or revoked.  Performed at [facility], [place] [address] [place], [place] [address]     Procedures and diagnostic studies:  DG Chest Port 1 View  Result Date: 08/23/2021 CLINICAL DATA:  Concern for sepsis.  Cough and congestion. EXAM: PORTABLE CHEST 1 VIEW COMPARISON:  02/28/2021 FINDINGS: Chronic asymmetric elevation of left hemidiaphragm, which obscures the left heart border. Opacity overlying the elevated left hemidiaphragm may represent pneumonia and/or atelectasis. Right lung appears clear. No signs of pleural effusion or interstitial edema.  IMPRESSION: 1. Chronic asymmetric elevation of left hemidiaphragm. 2. Opacity overlying the left hemidiaphragm may represent pneumonia and/or atelectasis. Electronically Signed   By: Kerby Moors M.D.   On: 08/23/2021 08:32    Medications:    docusate sodium  100 mg Oral BID   donepezil  10 mg Oral QHS   enoxaparin (LOVENOX) injection  40 mg Subcutaneous Q24H   famotidine  40 mg Oral QHS   finasteride  5 mg Oral Daily   memantine  10 mg  Oral BID   mycophenolate  500 mg Oral BID   oseltamivir  75 mg Oral BID   predniSONE  20 mg Oral Daily   pyridostigmine  60 mg Oral BID   sertraline  100 mg Oral Daily   tamsulosin  0.8 mg Oral Daily   Continuous Infusions:  cefTRIAXone (ROCEPHIN)  IV 2 g (08/24/21 1004)   doxycycline (VIBRAMYCIN) IV 100 mg (08/24/21 1112)     LOS: 1 day   Geradine Girt  [facility]   How to contact the [facility] Attending or Consulting provider [place] or covering provider during after hours [place], for this patient?  Check the care team in [facility] and look for a) attending/consulting TR[facility] provider listed and b) the [facility] team listed Log into [email or web address] and use [place]'s universal password to access. If you do not have the password, please contact the hospital operator. Locate the [facility] provider you are looking for under [facility] and page to a number that you can be directly reached. If you still have difficulty reaching the provider, please page the [facility] (Director on Call) for the Hospitalists listed on amion for assistance.  08/24/2021, 1:28 PM

## 2021-08-24 NOTE — Evaluation (Signed)
Physical Therapy Evaluation Patient Details Name: [name] MRN: [id number] DOB: 02-07-1941 Today's Date: 08/24/2021  History of Present Illness  Pt is a 80 yr old male who presented 12/30 due to increase in cough and congestion following a trip to [place].  Pt was admitted due to influenza and near syncope. PMH: anemia requiring transfusion; myasthenia gravis; dementia, BPH; HTN, HLD, OSA, L TKA, lumbar fusion, L total reverse total sx  Clinical Impression  Pt was seen for initiation of gait and balance testing, and noted pt has relative ease to move despite the struggles with RR, HR and O2 sats.  Pulse was 77, sats 89% and RR 25.  As pt relaxed and practiced pursed lip breathing, he elevated sats and had control of RR to drop to 16.  Follow along with him to progress mobility and strengthening, using RW and instructing him in breathing and awareness of his SOB to rest.  Wife was in attendance to observe and ask questions.  Follow up with acute PT goals as are outlined below.       Recommendations for follow up therapy are one component of a multi-disciplinary discharge planning process, led by the attending physician.  Recommendations may be updated based on patient status, additional functional criteria and insurance authorization.  Follow Up Recommendations Home health PT    Assistance Recommended at Discharge Frequent or constant Supervision/Assistance  Functional Status Assessment Patient has had a recent decline in their functional status and demonstrates the ability to make significant improvements in function in a reasonable and predictable amount of time.  Equipment Recommendations  None recommended by PT    Recommendations for Other Services       Precautions / Restrictions Precautions Precautions: Fall Precaution Comments: monitor HR and sats Restrictions Weight Bearing Restrictions: No      Mobility  Bed Mobility Overal bed mobility: Needs Assistance Bed  Mobility: Sit to Supine       Sit to supine: Min guard        Transfers Overall transfer level: Needs assistance Equipment used: Rolling walker (2 wheels) Transfers: Sit to/from Stand Sit to Stand: Min guard;From elevated surface           General transfer comment: cued hand placement and safety    Ambulation/Gait Ambulation/Gait assistance: Min guard Gait Distance (Feet): 40 Feet Assistive device: Rolling walker (2 wheels);1 person hand held assist Gait Pattern/deviations: Step-to pattern;Step-through pattern;Decreased stride length Gait velocity: reduced Gait velocity interpretation: <1.31 ft/sec, indicative of household ambulator Pre-gait activities: posture and standing balance correction General Gait Details: pt was reminded to take his time due to lines from telemetry and his close proximity to Forensic scientist    Modified Rankin (Stroke Patients Only)       Balance Overall balance assessment: Needs assistance Sitting-balance support: Feet supported Sitting balance-Leahy Scale: Good     Standing balance support: Bilateral upper extremity supported Standing balance-Leahy Scale: Fair Standing balance comment: less than fair on RW with dynamic balance                             Pertinent Vitals/Pain Pain Assessment: No/denies pain    Home Living Family/patient expects to be discharged to:: Private residence Living Arrangements: Spouse/significant other Available Help at Discharge: Family Type of Home: House Home Access: Level entry       Home Layout: Two level;Able  to live on main level with bedroom/bathroom Home Equipment: Rolling Walker (2 wheels);Cane - single point;Shower seat;Grab bars - tub/shower Additional Comments: wife confirmed information from eval    Prior Function Prior Level of Function : Needs assist  Cognitive Assist : Mobility (cognitive);ADLs (cognitive) Mobility (Cognitive):  Intermittent cues         Mobility Comments: using SPC only sporadically ADLs Comments: wife reports 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     Hand Dominance   Dominant Hand: Right    Extremity/Trunk Assessment   Upper Extremity Assessment Upper Extremity Assessment: Overall WFL for tasks assessed    Lower Extremity Assessment Lower Extremity Assessment: Overall WFL for tasks assessed    Cervical / Trunk Assessment Cervical / Trunk Assessment: Kyphotic  Communication   Communication: HOH;Expressive difficulties  Cognition Arousal/Alertness: Awake/alert Behavior During Therapy: WFL for tasks assessed/performed Overall Cognitive Status: History of cognitive impairments - at baseline                                 General Comments: pt is confident in his abiltiy to assist gait        General Comments General comments (skin integrity, edema, etc.): pt was observed to walk in RW with safety while supervised, became SOB with the effort and RR up to 25.  Sats were 89% on room air, talked with wife about the response to this work and his symptoms in light of PNA and flu as well now.  Pt has not fallen in recent past, has sundowning but wife monitors him for safety    Exercises     Assessment/Plan    PT Assessment Patient needs continued PT services  PT Problem List Cardiopulmonary status limiting activity;Decreased strength;Decreased balance;Decreased knowledge of use of DME       PT Treatment Interventions DME instruction;Gait training;Functional mobility training;Stair training;Therapeutic activities;Therapeutic exercise;Balance training;Neuromuscular re-education;Patient/family education    PT Goals (Current goals can be found in the Care Plan section)  Acute Rehab PT Goals Patient Stated Goal: to go home PT Goal Formulation: With patient/family Time For Goal Achievement: 09/06/21 Potential to Achieve Goals: Good    Frequency Min 3X/week    Barriers to discharge Decreased caregiver support home is accessible with just wife to assist    Co-evaluation               AM-PAC PT "6 Clicks" Mobility  Outcome Measure Help needed turning from your back to your side while in a flat bed without using bedrails?: None Help needed moving from lying on your back to sitting on the side of a flat bed without using bedrails?: A Little Help needed moving to and from a bed to a chair (including a wheelchair)?: A Little Help needed standing up from a chair using your arms (e.g., wheelchair or bedside chair)?: A Little Help needed to walk in hospital room?: A Little Help needed climbing 3-5 steps with a railing? : A Lot 6 Click Score: 18    End of Session Equipment Utilized During Treatment: Gait belt Activity Tolerance: Patient tolerated treatment well;Treatment limited secondary to medical complications (Comment) Patient left: in bed;with call bell/phone within reach;with bed alarm set;with family/visitor present Nurse Communication: Mobility status PT Visit Diagnosis: Unsteadiness on feet (R26.81);Muscle weakness (generalized) (M62.81);Difficulty in walking, not elsewhere classified (R26.2);Dizziness and giddiness (R42)    Time: 1341-1420 PT Time Calculation (min) (ACUTE ONLY): 39 min  Charges:   PT Evaluation $PT Eval Moderate Complexity: 1 Mod PT Treatments $Gait Training: 8-22 mins $Therapeutic Activity: 8-22 mins       Ramond Dial 08/24/2021, 4:42 PM  Mee Hives, PT PhD Acute Rehab Dept. Number: [place] [facility] [place]

## 2021-08-24 NOTE — Plan of Care (Signed)
°  Problem: Activity: Goal: Ability to tolerate increased activity will improve Outcome: Progressing   Problem: Clinical Measurements: Goal: Ability to maintain a body temperature in the normal range will improve Outcome: Progressing   Problem: Respiratory: Goal: Ability to maintain adequate ventilation will improve Outcome: Progressing Goal: Ability to maintain a clear airway will improve Outcome: Progressing   Problem: Activity: Goal: Risk for activity intolerance will decrease Outcome: Progressing

## 2021-08-25 ENCOUNTER — Other Ambulatory Visit: Payer: Self-pay | Admitting: Internal Medicine

## 2021-08-25 LAB — BASIC METABOLIC PANEL
Anion gap: 9 (ref 5–15)
BUN: 16 mg/dL (ref 8–23)
CO2: 25 mmol/L (ref 22–32)
Calcium: 8.9 mg/dL (ref 8.9–10.3)
Chloride: 105 mmol/L (ref 98–111)
Creatinine, Ser: 0.64 mg/dL (ref 0.61–1.24)
GFR, Estimated: >60 mL/min (ref >60)
Glucose, Bld: 84 mg/dL (ref 70–99)
Potassium: 3.5 mmol/L (ref 3.5–5.1)
Sodium: 139 mmol/L (ref 135–145)

## 2021-08-25 LAB — CBC
HCT: 38.4 % — ABNORMAL LOW (ref 39.0–52.0)
Hemoglobin: 12.3 g/dL — ABNORMAL LOW (ref 13.0–17.0)
MCH: 30.6 pg (ref 26.0–34.0)
MCHC: 32 g/dL (ref 30.0–36.0)
MCV: 95.5 fL (ref 80.0–100.0)
Platelets: 132 10*3/uL — ABNORMAL LOW (ref 150–400)
RBC: 4.02 MIL/uL — ABNORMAL LOW (ref 4.22–5.81)
RDW: 13.5 % (ref 11.5–15.5)
WBC: 3.9 10*3/uL — ABNORMAL LOW (ref 4.0–10.5)
nRBC: 0 % (ref 0.0–0.2)

## 2021-08-25 MED ORDER — OSELTAMIVIR PHOSPHATE 75 MG PO CAPS: 75.0000 mg | ORAL_CAPSULE | Freq: Two times a day (BID) | ORAL | 0 refills | Status: DC

## 2021-08-25 MED ORDER — GUAIFENESIN ER 600 MG PO TB12: 600.0000 mg | ORAL_TABLET | Freq: Two times a day (BID) | ORAL | Status: DC | PRN

## 2021-08-25 MED ORDER — DOXYCYCLINE HYCLATE 100 MG PO TABS: 100.0000 mg | ORAL_TABLET | Freq: Two times a day (BID) | ORAL | 0 refills | Status: DC

## 2021-08-25 MED ORDER — PREDNISONE 10 MG PO TABS: ORAL_TABLET | ORAL | 0 refills | Status: DC

## 2021-08-25 MED ORDER — DOXYCYCLINE HYCLATE 100 MG PO TABS
100.0000 mg | ORAL_TABLET | Freq: Two times a day (BID) | ORAL | Status: DC
  Administered 2021-08-25: 100 mg via ORAL
  Filled 2021-08-25: qty 1

## 2021-08-25 NOTE — Discharge Summary (Signed)
Physician Discharge Summary  [name] GMW:[id number] DOB: 12-Feb-1941 DOA: 08/23/2021  PCP: Cassandria Anger, MD  Admit date: 08/23/2021 Discharge date: 08/25/2021  Admitted From: home Discharge disposition: home   Recommendations for Outpatient Follow-Up:   Home health New brief meds: doxy, tamiflu Increased prednisone for short time   Discharge Diagnosis:   Principal Problem:   Pneumonia and influenza Active Problems:   BPH without obstruction/lower urinary tract symptoms   OSA on CPAP   Myasthenia gravis, AChR antibody positive (HCC)   Alzheimer disease ([place])   Chronic diastolic CHF (congestive heart failure) ([place])   DNR (do not resuscitate)    Discharge Condition: Improved.  Diet recommendation: Low sodium, heart healthy  Wound care: None.  Code status: DNR   History of Present Illness:   [name] is a 81 y.o. male with medical history significant of anemia requiring transfusion; myasthenia gravis; dementia; BPH; HTN; HLD; and OSA not on CPAP presenting with cough/congestion. His wife provided the history.  They were out of town for the holiday - took a [place] bus tour trip to [place] [employer] [place].  It was a busy trip and he seemed to be fine.  Wednesday night, he developed a cough about bedtime and it was worse yesterday.  He had a bad night.  She thought he just had a bad cold.  She has not thought he had fever.  They did have flu shots and have had 2 COVID boosters.  He does have some sundowning behavior.     Hospital Course by Problem:     Pneumonia and influenza- (present on admission) -Patient with increased cough and congestion, wife thought it was a URI -Found to have influenza A -Supportive care -Tamiflu BID -Also with ?LLL PNA  -Given his immunocompromise and difficulty with MG, will cover with doxy -Blood cultures : NGTD   DNR (do not resuscitate)- (present on admission) -I have discussed code status with the patient's  wife and the patient would not desire resuscitation and would prefer to die a natural death should that situation arise.   Chronic diastolic CHF (congestive heart failure) (HCC)- (present on admission) -Grade 1 diastolic dysfunction on echo in 2019 -Appears compensated at this time -d/c IVF -restart lasix in AM   Alzheimer disease ([place])- (present on admission) -Continue Ativan, Aricept, Namenda, Zoloft -Will order delirium precautions -prn IV Haldol, as he has a h/o sundowning -telesitter, as his wife is unlikely to stay overnight unless truly needed   Myasthenia gravis, AChR antibody positive ([place])- (present on admission) -Continue Mestinon, Cellcept -Takes prednisone 10 mg QOD; will increase to 20 mg daily for now due to presumed acute stress reaction -wean back to prior dose over next week or so   OSA on CPAP -No longer on CPAP at home   BPH without obstruction/lower urinary tract symptoms- (present on admission) -Continue Flomax, Proscar   obesity Body mass index is 33.97 kg/m.        Medical Consultants:      Discharge Exam:   Vitals:   08/25/21 0432 08/25/21 0833  BP: 140/85   Pulse: (!) 55 61  Resp: 20 18  Temp: 98 F (36.7 C)   SpO2: 94% 92%   Vitals:   08/24/21 2146 08/25/21 0050 08/25/21 0432 08/25/21 0833  BP: 131/72  140/85   Pulse: 93  (!) 55 61  Resp:   20 18  Temp: 98.8 F (37.1 C)  98 F (36.7 C)   TempSrc:  Oral  Oral   SpO2: 94%  94% 92%  Weight:  111.4 kg    Height:        General exam: Appears calm and comfortable.     The results of significant diagnostics from this hospitalization (including imaging, microbiology, ancillary and laboratory) are listed below for reference.     Procedures and Diagnostic Studies:   DG Chest Port 1 View  Result Date: 08/23/2021 CLINICAL DATA:  Concern for sepsis.  Cough and congestion. EXAM: PORTABLE CHEST 1 VIEW COMPARISON:  02/28/2021 FINDINGS: Chronic asymmetric elevation of left  hemidiaphragm, which obscures the left heart border. Opacity overlying the elevated left hemidiaphragm may represent pneumonia and/or atelectasis. Right lung appears clear. No signs of pleural effusion or interstitial edema. IMPRESSION: 1. Chronic asymmetric elevation of left hemidiaphragm. 2. Opacity overlying the left hemidiaphragm may represent pneumonia and/or atelectasis. Electronically Signed   By: Kerby Moors M.D.   On: 08/23/2021 08:32     Labs:   Basic Metabolic Panel: Recent Labs  Lab 08/23/21 0815 08/24/21 0409 08/25/21 0339  NA 135 138 139  K 3.4* 3.7 3.5  CL 103 106 105  CO2 25 24 25   GLUCOSE 108* 82 84  BUN 20 16 16   CREATININE 0.87 0.65 0.64  CALCIUM 8.8* 8.6* 8.9   GFR Estimated Creatinine Clearance: 94.9 mL/min (by C-G formula based on SCr of 0.64 mg/dL). Liver Function Tests: Recent Labs  Lab 08/23/21 0815  AST 19  ALT 17  ALKPHOS 65  BILITOT 1.0  PROT 5.6*  ALBUMIN 3.4*   No results for input(s): LIPASE, AMYLASE in the last 168 hours. No results for input(s): AMMONIA in the last 168 hours. Coagulation profile Recent Labs  Lab 08/23/21 0815  INR 1.1    CBC: Recent Labs  Lab 08/23/21 0815 08/24/21 0409 08/25/21 0339  WBC 6.5 5.7 3.9*  NEUTROABS 5.6  --   --   HGB 12.7* 11.5* 12.3*  HCT 39.9 36.8* 38.4*  MCV 95.5 95.3 95.5  PLT 125* 112* 132*   Cardiac Enzymes: No results for input(s): CKTOTAL, CKMB, CKMBINDEX, TROPONINI in the last 168 hours. BNP: Invalid input(s): POCBNP CBG: No results for input(s): GLUCAP in the last 168 hours. D-Dimer No results for input(s): DDIMER in the last 72 hours. Hgb A1c No results for input(s): HGBA1C in the last 72 hours. Lipid Profile No results for input(s): CHOL, HDL, LDLCALC, TRIG, CHOLHDL, LDLDIRECT in the last 72 hours. Thyroid function studies No results for input(s): TSH, T4TOTAL, T3FREE, THYROIDAB in the last 72 hours.  Invalid input(s): FREET3 Anemia work up No results for input(s):  VITAMINB12, FOLATE, FERRITIN, TIBC, IRON, RETICCTPCT in the last 72 hours. Microbiology Recent Results (from the past 240 hour(s))  Culture, blood (Routine x 2)     Status: None (Preliminary result)   Collection Time: 08/23/21  7:57 AM   Specimen: BLOOD RIGHT WRIST  Result Value Ref Range Status   Specimen Description BLOOD RIGHT WRIST  Final   Special Requests   Final    BOTTLES DRAWN AEROBIC AND ANAEROBIC Blood Culture results may not be optimal due to an inadequate volume of blood received in culture bottles   Culture   Final    NO GROWTH 1 DAY Performed at [facility], [place] [address] [place],  [address]    Report Status PENDING  Incomplete  Culture, blood (Routine x 2)     Status: None (Preliminary result)   Collection Time: 08/23/21  8:02 AM  Specimen: BLOOD RIGHT HAND  Result Value Ref Range Status   Specimen Description BLOOD RIGHT HAND  Final   Special Requests   Final    BOTTLES DRAWN AEROBIC AND ANAEROBIC Blood Culture adequate volume   Culture   Final    NO GROWTH 1 DAY Performed at [facility], [address], [place], [place] [address]    Report Status PENDING  Incomplete  Resp Panel by RT-PCR (Flu A&B, Covid)     Status: Abnormal   Collection Time: 08/23/21  8:15 AM   Specimen: Nasopharyngeal(NP) swabs in vial transport medium  Result Value Ref Range Status   SARS Coronavirus 2 by RT PCR NEGATIVE NEGATIVE Final    Comment: (NOTE) SARS-CoV-2 target nucleic acids are NOT DETECTED.  The SARS-CoV-2 RNA is generally detectable in upper respiratory specimens during the acute phase of infection. The lowest concentration of SARS-CoV-2 viral copies this assay can detect is 138 copies/mL. A negative result does not preclude SARS-Cov-2 infection and should not be used as the sole basis for treatment or other patient management decisions. A negative result may occur with  improper specimen collection/handling, submission of specimen other than  nasopharyngeal swab, presence of viral mutation(s) within the areas targeted by this assay, and inadequate number of viral copies(<138 copies/mL). A negative result must be combined with clinical observations, patient history, and epidemiological information. The expected result is Negative.  Fact Sheet for Patients:  EntrepreneurPulse.com.au  Fact Sheet for Healthcare Providers:  IncredibleEmployment.be  This test is no t yet approved or cleared by the [place] FDA and  has been authorized for detection and/or diagnosis of SARS-CoV-2 by FDA under an Emergency Use Authorization (EUA). This EUA will remain  in effect (meaning this test can be used) for the duration of the COVID-19 declaration under Section 564(b)(1) of the Act, 21 U.S.C.section 360bbb-3(b)(1), unless the authorization is terminated  or revoked sooner.       Influenza A by PCR POSITIVE (A) NEGATIVE Final   Influenza B by PCR NEGATIVE NEGATIVE Final    Comment: (NOTE) The Xpert Xpress SARS-CoV-2/FLU/RSV plus assay is intended as an aid in the diagnosis of influenza from Nasopharyngeal swab specimens and should not be used as a sole basis for treatment. Nasal washings and aspirates are unacceptable for Xpert Xpress SARS-CoV-2/FLU/RSV testing.  Fact Sheet for Patients: EntrepreneurPulse.com.au  Fact Sheet for Healthcare Providers: IncredibleEmployment.be  This test is not yet approved or cleared by the [place] FDA and has been authorized for detection and/or diagnosis of SARS-CoV-2 by FDA under an Emergency Use Authorization (EUA). This EUA will remain in effect (meaning this test can be used) for the duration of the COVID-19 declaration under Section 564(b)(1) of the Act, 21 U.S.C. section 360bbb-3(b)(1), unless the authorization is terminated or revoked.  Performed at [facility], [place] [address] [place],  [place] [address]      Discharge Instructions:   Discharge Instructions     Diet general   Complete by: As directed    Increase activity slowly   Complete by: As directed       Allergies as of 08/25/2021       Reactions   Ferumoxytol Other (See Comments)   Stroke like symptoms; has hx of MG as well         Medication List     TAKE these medications    CO Q-10 PO Take 1 tablet by mouth at bedtime.   donepezil 10 MG tablet  Commonly known as: ARICEPT TAKE 1 TABLET BY MOUTH EVERYDAY AT BEDTIME What changed: See the new instructions.   doxycycline 100 MG tablet Commonly known as: VIBRA-TABS Take 1 tablet (100 mg total) by mouth every 12 (twelve) hours.   famotidine 40 MG tablet Commonly known as: PEPCID Take 1 tablet by mouth at bedtime   finasteride 5 MG tablet Commonly known as: PROSCAR Take 1 tablet (5 mg total) by mouth daily.   furosemide 20 MG tablet Commonly known as: LASIX TAKE 1 TO 2 TABLETS BY  MOUTH DAILY IN THE EVENING What changed:  how much to take how to take this when to take this additional instructions   guaiFENesin 600 MG 12 hr tablet Commonly known as: MUCINEX Take 1 tablet (600 mg total) by mouth 2 (two) times daily as needed for cough.   LORazepam 1 MG tablet Commonly known as: ATIVAN Take 1 tablet (1 mg total) by mouth 2 (two) times daily as needed for anxiety. What changed: when to take this   memantine 10 MG tablet Commonly known as: Namenda Take 1 tablet (10 mg total) by mouth 2 (two) times daily.   mycophenolate 250 MG capsule Commonly known as: CELLCEPT Take 500 mg by mouth 2 (two) times daily.   oseltamivir 75 MG capsule Commonly known as: TAMIFLU Take 1 capsule (75 mg total) by mouth 2 (two) times daily.   potassium chloride 10 MEQ tablet Commonly known as: KLOR-CON Take 1 tablet (10 mEq total) by mouth 2 (two) times daily.   predniSONE 10 MG tablet Commonly known as: DELTASONE 20 mg daily x 5 days then 10 mg daily x  5 days then resume your home dose of 10 mg every other day What changed: additional instructions   pyridostigmine 60 MG tablet Commonly known as: MESTINON One in AM , one at lunch, 2 at bedtime. What changed:  how much to take how to take this when to take this additional instructions   sertraline 100 MG tablet Commonly known as: ZOLOFT TAKE 1 TABLET BY MOUTH EVERY DAY   tamsulosin 0.4 MG Caps capsule Commonly known as: FLOMAX Take 2 capsules by mouth every day   Vitamin B-12 1000 MCG Subl Place 1 tablet (1,000 mcg total) under the tongue daily. What changed: when to take this   vitamin C 500 MG tablet Commonly known as: ASCORBIC ACID Take 500 mg by mouth daily.   Vitamin D3 50 MCG (2000 UT) Tabs Take 2,000 Units by mouth at bedtime.          Time coordinating discharge: 35 min  Signed:  Geradine Girt DO  [facility] 08/25/2021, 11:02 AM

## 2021-08-25 NOTE — TOC Transition Note (Signed)
Transition of Care [facility]) - CM/SW Discharge Note   Patient Details  Name: [name] MRN: [id number] Date of Birth: 09-27-1940  [facility] CM/SW Contact:  Carles Collet, RN Phone Number: 08/25/2021, 1:05 PM   Clinical Narrative:    Spoke w patient and wife at bedside. They are in agreement for [facility] services. Spoke w MD and obtained orders. Wife requested [facility], they are unable accept.  Referral pending for [facility]  No DME needs    Final next level of care: Home w Home Health Services Barriers to Discharge: No Barriers Identified   Patient Goals and CMS Choice Patient states their goals for this hospitalization and ongoing recovery are:: to go home CMS Medicare.gov Compare Post Acute Care list provided to:: Other (Comment Required) Choice offered to / list presented to : Spouse  Discharge Placement                       Discharge Plan and Services                          HH Arranged: PT, OT [facility] Agency: [place] Date [place]: 08/25/21 Time [place]: 0962 Representative spoke with at [place]: [place] ([place]) Interventions     Readmission Risk Interventions No flowsheet data found.

## 2021-08-25 NOTE — Significant Event (Signed)
SATURATION QUALIFICATIONS: (This note is used to comply with regulatory documentation for home oxygen)  Patient Saturations on Room Air at Rest = 91%  Patient Saturations on Room Air while Ambulating = 93%  Patient Saturations on  Liters of oxygen while Ambulating = 94%  Please briefly explain why patient needs home oxygen:  no need for oxygen.

## 2021-08-25 NOTE — Progress Notes (Signed)
Pt is pleasant calm confused. Dangling on side of bed eating breakfast 02 sat 92 on room air

## 2021-08-27 ENCOUNTER — Telehealth: Payer: Self-pay

## 2021-08-27 DIAGNOSIS — G309 Alzheimer's disease, unspecified: Secondary | ICD-10-CM | POA: Diagnosis not present

## 2021-08-27 DIAGNOSIS — D509 Iron deficiency anemia, unspecified: Secondary | ICD-10-CM | POA: Diagnosis not present

## 2021-08-27 DIAGNOSIS — Z96653 Presence of artificial knee joint, bilateral: Secondary | ICD-10-CM | POA: Diagnosis not present

## 2021-08-27 DIAGNOSIS — G4733 Obstructive sleep apnea (adult) (pediatric): Secondary | ICD-10-CM | POA: Diagnosis not present

## 2021-08-27 DIAGNOSIS — G7000 Myasthenia gravis without (acute) exacerbation: Secondary | ICD-10-CM | POA: Diagnosis not present

## 2021-08-27 DIAGNOSIS — G4700 Insomnia, unspecified: Secondary | ICD-10-CM | POA: Diagnosis not present

## 2021-08-27 DIAGNOSIS — Z792 Long term (current) use of antibiotics: Secondary | ICD-10-CM | POA: Diagnosis not present

## 2021-08-27 DIAGNOSIS — Z9181 History of falling: Secondary | ICD-10-CM | POA: Diagnosis not present

## 2021-08-27 DIAGNOSIS — K219 Gastro-esophageal reflux disease without esophagitis: Secondary | ICD-10-CM | POA: Diagnosis not present

## 2021-08-27 DIAGNOSIS — Z87891 Personal history of nicotine dependence: Secondary | ICD-10-CM | POA: Diagnosis not present

## 2021-08-27 DIAGNOSIS — Z7952 Long term (current) use of systemic steroids: Secondary | ICD-10-CM | POA: Diagnosis not present

## 2021-08-27 DIAGNOSIS — I110 Hypertensive heart disease with heart failure: Secondary | ICD-10-CM | POA: Diagnosis not present

## 2021-08-27 DIAGNOSIS — I5032 Chronic diastolic (congestive) heart failure: Secondary | ICD-10-CM | POA: Diagnosis not present

## 2021-08-27 DIAGNOSIS — E785 Hyperlipidemia, unspecified: Secondary | ICD-10-CM | POA: Diagnosis not present

## 2021-08-27 DIAGNOSIS — Z981 Arthrodesis status: Secondary | ICD-10-CM | POA: Diagnosis not present

## 2021-08-27 NOTE — Telephone Encounter (Signed)
Transition Care Management Follow-up Telephone Call Date of discharge and from where: [facility] 08-25-21 Dx: pneumonia/ flu How have you been since you were released from the hospital? Feeling better  Any questions or concerns? Yes  Items Reviewed: Did the pt receive and understand the discharge instructions provided? Yes  Medications obtained and verified? Yes  Other? No  Any new allergies since your discharge? No  Dietary orders reviewed? Yes Do you have support at home? Yes   Home Care and Equipment/Supplies: Were home health services ordered? Yes- PT If so, what is the name of the agency?  [facility] Has the agency set up a time to come to the patient's home? yes Were any new equipment or medical supplies ordered?  No What is the name of the medical supply agency? na Were you able to get the supplies/equipment? not applicable Do you have any questions related to the use of the equipment or supplies? No  Functional Questionnaire: (I = Independent and D = Dependent) ADLs: I  Bathing/Dressing- I  Meal Prep- I  Eating- I  Maintaining continence- I  Transferring/Ambulation- I  Managing Meds- D  Follow up appointments reviewed:  PCP Hospital f/u appt confirmed? Yes  Scheduled to see Dr Alain Marion on 09-02-21  @ [facility] f/u appt confirmed? No . Are transportation arrangements needed? No  If their condition worsens, is the pt aware to call PCP or go to the Emergency Dept.? Yes Was the patient provided with contact information for the PCP's office or ED? Yes Was to pt encouraged to call back with questions or concerns? Yes

## 2021-08-28 LAB — CULTURE, BLOOD (ROUTINE X 2)
Culture: NO GROWTH
Culture: NO GROWTH
Special Requests: ADEQUATE

## 2021-08-29 DIAGNOSIS — Z981 Arthrodesis status: Secondary | ICD-10-CM | POA: Diagnosis not present

## 2021-08-29 DIAGNOSIS — Z87891 Personal history of nicotine dependence: Secondary | ICD-10-CM | POA: Diagnosis not present

## 2021-08-29 DIAGNOSIS — Z9181 History of falling: Secondary | ICD-10-CM | POA: Diagnosis not present

## 2021-08-29 DIAGNOSIS — G7000 Myasthenia gravis without (acute) exacerbation: Secondary | ICD-10-CM | POA: Diagnosis not present

## 2021-08-29 DIAGNOSIS — K219 Gastro-esophageal reflux disease without esophagitis: Secondary | ICD-10-CM | POA: Diagnosis not present

## 2021-08-29 DIAGNOSIS — I110 Hypertensive heart disease with heart failure: Secondary | ICD-10-CM | POA: Diagnosis not present

## 2021-08-29 DIAGNOSIS — G4700 Insomnia, unspecified: Secondary | ICD-10-CM | POA: Diagnosis not present

## 2021-08-29 DIAGNOSIS — G4733 Obstructive sleep apnea (adult) (pediatric): Secondary | ICD-10-CM | POA: Diagnosis not present

## 2021-08-29 DIAGNOSIS — I5032 Chronic diastolic (congestive) heart failure: Secondary | ICD-10-CM | POA: Diagnosis not present

## 2021-08-29 DIAGNOSIS — Z96653 Presence of artificial knee joint, bilateral: Secondary | ICD-10-CM | POA: Diagnosis not present

## 2021-08-29 DIAGNOSIS — G309 Alzheimer's disease, unspecified: Secondary | ICD-10-CM | POA: Diagnosis not present

## 2021-08-29 DIAGNOSIS — Z792 Long term (current) use of antibiotics: Secondary | ICD-10-CM | POA: Diagnosis not present

## 2021-08-29 DIAGNOSIS — E785 Hyperlipidemia, unspecified: Secondary | ICD-10-CM | POA: Diagnosis not present

## 2021-08-29 DIAGNOSIS — Z7952 Long term (current) use of systemic steroids: Secondary | ICD-10-CM | POA: Diagnosis not present

## 2021-08-29 DIAGNOSIS — D509 Iron deficiency anemia, unspecified: Secondary | ICD-10-CM | POA: Diagnosis not present

## 2021-08-29 NOTE — Telephone Encounter (Signed)
Noted. Thanks.

## 2021-08-30 DIAGNOSIS — K219 Gastro-esophageal reflux disease without esophagitis: Secondary | ICD-10-CM | POA: Diagnosis not present

## 2021-08-30 DIAGNOSIS — I5032 Chronic diastolic (congestive) heart failure: Secondary | ICD-10-CM | POA: Diagnosis not present

## 2021-08-30 DIAGNOSIS — I110 Hypertensive heart disease with heart failure: Secondary | ICD-10-CM | POA: Diagnosis not present

## 2021-08-30 DIAGNOSIS — Z981 Arthrodesis status: Secondary | ICD-10-CM | POA: Diagnosis not present

## 2021-08-30 DIAGNOSIS — G4733 Obstructive sleep apnea (adult) (pediatric): Secondary | ICD-10-CM | POA: Diagnosis not present

## 2021-08-30 DIAGNOSIS — Z7952 Long term (current) use of systemic steroids: Secondary | ICD-10-CM | POA: Diagnosis not present

## 2021-08-30 DIAGNOSIS — D509 Iron deficiency anemia, unspecified: Secondary | ICD-10-CM | POA: Diagnosis not present

## 2021-08-30 DIAGNOSIS — G309 Alzheimer's disease, unspecified: Secondary | ICD-10-CM | POA: Diagnosis not present

## 2021-08-30 DIAGNOSIS — G7000 Myasthenia gravis without (acute) exacerbation: Secondary | ICD-10-CM | POA: Diagnosis not present

## 2021-08-30 DIAGNOSIS — E785 Hyperlipidemia, unspecified: Secondary | ICD-10-CM | POA: Diagnosis not present

## 2021-08-30 DIAGNOSIS — Z792 Long term (current) use of antibiotics: Secondary | ICD-10-CM | POA: Diagnosis not present

## 2021-08-30 DIAGNOSIS — Z9181 History of falling: Secondary | ICD-10-CM | POA: Diagnosis not present

## 2021-08-30 DIAGNOSIS — Z96653 Presence of artificial knee joint, bilateral: Secondary | ICD-10-CM | POA: Diagnosis not present

## 2021-08-30 DIAGNOSIS — G4700 Insomnia, unspecified: Secondary | ICD-10-CM | POA: Diagnosis not present

## 2021-08-30 DIAGNOSIS — Z87891 Personal history of nicotine dependence: Secondary | ICD-10-CM | POA: Diagnosis not present

## 2021-09-02 ENCOUNTER — Encounter: Payer: Self-pay | Admitting: Internal Medicine

## 2021-09-02 ENCOUNTER — Other Ambulatory Visit: Payer: Self-pay

## 2021-09-02 ENCOUNTER — Ambulatory Visit (INDEPENDENT_AMBULATORY_CARE_PROVIDER_SITE_OTHER): Payer: Medicare Other | Admitting: Internal Medicine

## 2021-09-02 DIAGNOSIS — F0280 Dementia in other diseases classified elsewhere without behavioral disturbance: Secondary | ICD-10-CM | POA: Diagnosis not present

## 2021-09-02 DIAGNOSIS — G309 Alzheimer's disease, unspecified: Secondary | ICD-10-CM

## 2021-09-02 DIAGNOSIS — J189 Pneumonia, unspecified organism: Secondary | ICD-10-CM

## 2021-09-02 DIAGNOSIS — G4733 Obstructive sleep apnea (adult) (pediatric): Secondary | ICD-10-CM | POA: Diagnosis not present

## 2021-09-02 DIAGNOSIS — I5032 Chronic diastolic (congestive) heart failure: Secondary | ICD-10-CM

## 2021-09-02 NOTE — Assessment & Plan Note (Signed)
°  Finished Doxy

## 2021-09-02 NOTE — Progress Notes (Signed)
Subjective:  Patient ID: [name], male    DOB: Oct 29, 1940  Age: 81 y.o. MRN: [id number]  CC: Follow-up (TCM Hosp f/u- Pt dx w/ flu &pneumonia)   HPI [name] presents for LLL CAP, anemia, ?CHF f/u  Per hx:  "Admit date: 08/23/2021 Discharge date: 08/25/2021   Admitted From: home Discharge disposition: home     Recommendations for Outpatient Follow-Up:    Home health New brief meds: doxy, tamiflu Increased prednisone for short time     Discharge Diagnosis:    Principal Problem:   Pneumonia and influenza Active Problems:   BPH without obstruction/lower urinary tract symptoms   OSA on CPAP   Myasthenia gravis, AChR antibody positive (HCC)   Alzheimer disease ([place])   Chronic diastolic CHF (congestive heart failure) ([place])   DNR (do not resuscitate)       Discharge Condition: Improved.   Diet recommendation: Low sodium, heart healthy   Wound care: None.   Code status: DNR     History of Present Illness:    [name] is a 81 y.o. male with medical history significant of anemia requiring transfusion; myasthenia gravis; dementia; BPH; HTN; HLD; and OSA not on CPAP presenting with cough/congestion. His wife provided the history.  They were out of town for the holiday - took a [place] bus tour trip to [place].  It was a busy trip and he seemed to be fine.  Wednesday night, he developed a cough about bedtime and it was worse yesterday.  He had a bad night.  She thought he just had a bad cold.  She has not thought he had fever.  They did have flu shots and have had 2 COVID boosters.  He does have some sundowning behavior.       Hospital Course by Problem:      Pneumonia and influenza- (present on admission) -Patient with increased cough and congestion, wife thought it was a URI -Found to have influenza A -Supportive care -Tamiflu BID -Also with ?LLL PNA  -Given his immunocompromise and difficulty with MG, will cover with doxy -Blood  cultures : NGTD   DNR (do not resuscitate)- (present on admission) -I have discussed code status with the patient's wife and the patient would not desire resuscitation and would prefer to die a natural death should that situation arise.   Chronic diastolic CHF (congestive heart failure) (HCC)- (present on admission) -Grade 1 diastolic dysfunction on echo in 2019 -Appears compensated at this time -d/c IVF -restart lasix in AM   Alzheimer disease ([place])- (present on admission) -Continue Ativan, Aricept, Namenda, Zoloft -Will order delirium precautions -prn IV Haldol, as he has a h/o sundowning -telesitter, as his wife is unlikely to stay overnight unless truly needed   Myasthenia gravis, AChR antibody positive ([place])- (present on admission) -Continue Mestinon, Cellcept -Takes prednisone 10 mg QOD; will increase to 20 mg daily for now due to presumed acute stress reaction -wean back to prior dose over next week or so   OSA on CPAP -No longer on CPAP at home   BPH without obstruction/lower urinary tract symptoms- (present on admission) -Continue Flomax, Proscar   obesity Body mass index is 33.97 kg/m."  Outpatient Medications Prior to Visit  Medication Sig Dispense Refill   Cholecalciferol (VITAMIN D3) 2000 units TABS Take 2,000 Units by mouth at bedtime.     Coenzyme Q10 (CO Q-10 PO) Take 1 tablet by mouth at bedtime.     Cyanocobalamin (VITAMIN B-12) 1000 MCG  SUBL Place 1 tablet (1,000 mcg total) under the tongue daily. (Patient taking differently: Place 1,000 mcg under the tongue at bedtime.) 100 tablet 3   donepezil (ARICEPT) 10 MG tablet Take 1 tablet by mouth at bedtime 30 tablet 11   famotidine (PEPCID) 40 MG tablet Take 1 tablet by mouth at bedtime (Patient taking differently: Take 40 mg by mouth at bedtime.) 30 tablet 11   finasteride (PROSCAR) 5 MG tablet Take 1 tablet (5 mg total) by mouth daily. 90 tablet 3   furosemide (LASIX) 20 MG tablet TAKE 1 TO 2 TABLETS BY  MOUTH  DAILY IN THE EVENING (Patient taking differently: Take 20 mg by mouth every evening.) 270 tablet 1   guaiFENesin (MUCINEX) 600 MG 12 hr tablet Take 1 tablet (600 mg total) by mouth 2 (two) times daily as needed for cough.     LORazepam (ATIVAN) 1 MG tablet Take 1 tablet (1 mg total) by mouth 2 (two) times daily as needed for anxiety. (Patient taking differently: Take 1 mg by mouth daily as needed for anxiety.) 60 tablet 1   memantine (NAMENDA) 10 MG tablet Take 1 tablet (10 mg total) by mouth 2 (two) times daily. 180 tablet 3   mycophenolate (CELLCEPT) 250 MG capsule Take 500 mg by mouth 2 (two) times daily.     potassium chloride (KLOR-CON) 10 MEQ tablet Take 1 tablet (10 mEq total) by mouth 2 (two) times daily. 180 tablet 3   pyridostigmine (MESTINON) 60 MG tablet One in AM , one at lunch, 2 at bedtime. (Patient taking differently: Take 60 mg by mouth 2 (two) times daily.) 450 tablet 3   sertraline (ZOLOFT) 100 MG tablet TAKE 1 TABLET BY MOUTH EVERY DAY (Patient taking differently: Take 100 mg by mouth daily.) 90 tablet 3   tamsulosin (FLOMAX) 0.4 MG CAPS capsule Take 2 capsules by mouth every day (Patient taking differently: Take 0.8 mg by mouth daily.) 60 capsule 5   vitamin C (ASCORBIC ACID) 500 MG tablet Take 500 mg by mouth daily.     doxycycline (VIBRA-TABS) 100 MG tablet Take 1 tablet (100 mg total) by mouth every 12 (twelve) hours. (Patient not taking: Reported on 09/02/2021) 10 tablet 0   oseltamivir (TAMIFLU) 75 MG capsule Take 1 capsule (75 mg total) by mouth 2 (two) times daily. (Patient not taking: Reported on 09/02/2021) 6 capsule 0   predniSONE (DELTASONE) 10 MG tablet 20 mg daily x 5 days then 10 mg daily x 5 days then resume your home dose of 10 mg every other day (Patient not taking: Reported on 09/02/2021) 30 tablet 0   No facility-administered medications prior to visit.    ROS: Review of Systems  Constitutional:  Positive for fatigue. Negative for appetite change and unexpected  weight change.  HENT:  Negative for congestion, nosebleeds, sneezing, sore throat and trouble swallowing.   Eyes:  Negative for itching and visual disturbance.  Respiratory:  Positive for shortness of breath. Negative for cough.   Cardiovascular:  Negative for chest pain, palpitations and leg swelling.  Gastrointestinal:  Negative for abdominal distention, blood in stool, diarrhea and nausea.  Genitourinary:  Negative for frequency and hematuria.  Musculoskeletal:  Positive for arthralgias and gait problem. Negative for back pain, joint swelling and neck pain.  Skin:  Negative for rash.  Neurological:  Positive for weakness. Negative for dizziness, tremors and speech difficulty.  Psychiatric/Behavioral:  Positive for confusion and decreased concentration. Negative for agitation, dysphoric mood, sleep disturbance and suicidal  ideas. The patient is not nervous/anxious.    Objective:  BP 110/64 (BP Location: Left Arm)    Pulse 61    Temp 98.1 F (36.7 C) (Oral)    Ht 6' (1.829 m)    Wt 235 lb 6.4 oz (106.8 kg)    SpO2 95%    BMI 31.93 kg/m   BP Readings from Last 3 Encounters:  09/02/21 110/64  08/25/21 (!) 141/77  07/08/21 122/60    Wt Readings from Last 3 Encounters:  09/02/21 235 lb 6.4 oz (106.8 kg)  08/25/21 245 lb 8 oz (111.4 kg)  07/08/21 242 lb 6.4 oz (110 kg)    Physical Exam Constitutional:      General: He is not in acute distress.    Appearance: He is well-developed.     Comments: NAD  Eyes:     Conjunctiva/sclera: Conjunctivae normal.     Pupils: Pupils are equal, round, and reactive to light.  Neck:     Thyroid: No thyromegaly.     Vascular: No JVD.  Cardiovascular:     Rate and Rhythm: Normal rate and regular rhythm.     Heart sounds: Normal heart sounds. No murmur heard.   No friction rub. No gallop.  Pulmonary:     Effort: Pulmonary effort is normal. No respiratory distress.     Breath sounds: Normal breath sounds. No wheezing or rales.  Chest:     Chest  wall: No tenderness.  Abdominal:     General: Bowel sounds are normal. There is no distension.     Palpations: Abdomen is soft. There is no mass.     Tenderness: There is no abdominal tenderness. There is no guarding or rebound.  Musculoskeletal:        General: No tenderness. Normal range of motion.     Cervical back: Normal range of motion.  Lymphadenopathy:     Cervical: No cervical adenopathy.  Skin:    General: Skin is warm and dry.     Findings: No rash.  Neurological:     Mental Status: He is alert.     Cranial Nerves: No cranial nerve deficit.     Motor: No abnormal muscle tone.     Coordination: Coordination normal.     Gait: Gait abnormal.     Deep Tendon Reflexes: Reflexes are normal and symmetric.  Psychiatric:        Behavior: Behavior normal.        Thought Content: Thought content normal.        Judgment: Judgment normal.  Antalgic gait DOE  Lab Results  Component Value Date   WBC 3.9 (L) 08/25/2021   HGB 12.3 (L) 08/25/2021   HCT 38.4 (L) 08/25/2021   PLT 132 (L) 08/25/2021   GLUCOSE 84 08/25/2021   CHOL 246 (H) 03/28/2021   TRIG 107.0 03/28/2021   HDL 62.80 03/28/2021   LDLDIRECT 157.5 07/05/2008   LDLCALC 162 (H) 03/28/2021   ALT 17 08/23/2021   AST 19 08/23/2021   NA 139 08/25/2021   K 3.5 08/25/2021   CL 105 08/25/2021   CREATININE 0.64 08/25/2021   BUN 16 08/25/2021   CO2 25 08/25/2021   TSH 1.06 03/28/2021   PSA 0.21 03/28/2021   INR 1.1 08/23/2021   HGBA1C 5.5 02/05/2018    DG Chest Port 1 View  Result Date: 08/23/2021 CLINICAL DATA:  Concern for sepsis.  Cough and congestion. EXAM: PORTABLE CHEST 1 VIEW COMPARISON:  02/28/2021 FINDINGS: Chronic asymmetric elevation of left  hemidiaphragm, which obscures the left heart border. Opacity overlying the elevated left hemidiaphragm may represent pneumonia and/or atelectasis. Right lung appears clear. No signs of pleural effusion or interstitial edema. IMPRESSION: 1. Chronic asymmetric elevation  of left hemidiaphragm. 2. Opacity overlying the left hemidiaphragm may represent pneumonia and/or atelectasis. Electronically Signed   By: Kerby Moors M.D.   On: 08/23/2021 08:32    Assessment & Plan:   Problem List Items Addressed This Visit     Chronic diastolic CHF (congestive heart failure) (HCC) (Chronic)    On Lasix      Relevant Orders   DG Chest 2 View   CBC with Differential/Platelet   Comprehensive metabolic panel   Alzheimer disease ([place])    Cont on Aricept, Namenda      Relevant Orders   CBC with Differential/Platelet   Comprehensive metabolic panel   CAP (community acquired pneumonia)     Finished Doxy      Relevant Orders   DG Chest 2 View      No orders of the defined types were placed in this encounter.     Follow-up: Return in about 3 months (around 12/01/2021) for a follow-up visit.  [name], MD

## 2021-09-02 NOTE — Assessment & Plan Note (Signed)
Cont on Aricept, Namenda

## 2021-09-02 NOTE — Assessment & Plan Note (Signed)
On Lasix

## 2021-09-03 DIAGNOSIS — E785 Hyperlipidemia, unspecified: Secondary | ICD-10-CM | POA: Diagnosis not present

## 2021-09-03 DIAGNOSIS — G4733 Obstructive sleep apnea (adult) (pediatric): Secondary | ICD-10-CM | POA: Diagnosis not present

## 2021-09-03 DIAGNOSIS — G7000 Myasthenia gravis without (acute) exacerbation: Secondary | ICD-10-CM | POA: Diagnosis not present

## 2021-09-03 DIAGNOSIS — Z9181 History of falling: Secondary | ICD-10-CM | POA: Diagnosis not present

## 2021-09-03 DIAGNOSIS — Z7952 Long term (current) use of systemic steroids: Secondary | ICD-10-CM | POA: Diagnosis not present

## 2021-09-03 DIAGNOSIS — Z981 Arthrodesis status: Secondary | ICD-10-CM | POA: Diagnosis not present

## 2021-09-03 DIAGNOSIS — D509 Iron deficiency anemia, unspecified: Secondary | ICD-10-CM | POA: Diagnosis not present

## 2021-09-03 DIAGNOSIS — G309 Alzheimer's disease, unspecified: Secondary | ICD-10-CM | POA: Diagnosis not present

## 2021-09-03 DIAGNOSIS — I5032 Chronic diastolic (congestive) heart failure: Secondary | ICD-10-CM | POA: Diagnosis not present

## 2021-09-03 DIAGNOSIS — I110 Hypertensive heart disease with heart failure: Secondary | ICD-10-CM | POA: Diagnosis not present

## 2021-09-03 DIAGNOSIS — Z96653 Presence of artificial knee joint, bilateral: Secondary | ICD-10-CM | POA: Diagnosis not present

## 2021-09-03 DIAGNOSIS — G4700 Insomnia, unspecified: Secondary | ICD-10-CM | POA: Diagnosis not present

## 2021-09-03 DIAGNOSIS — Z87891 Personal history of nicotine dependence: Secondary | ICD-10-CM | POA: Diagnosis not present

## 2021-09-03 DIAGNOSIS — Z792 Long term (current) use of antibiotics: Secondary | ICD-10-CM | POA: Diagnosis not present

## 2021-09-03 DIAGNOSIS — K219 Gastro-esophageal reflux disease without esophagitis: Secondary | ICD-10-CM | POA: Diagnosis not present

## 2021-09-04 DIAGNOSIS — Z792 Long term (current) use of antibiotics: Secondary | ICD-10-CM

## 2021-09-04 DIAGNOSIS — Z981 Arthrodesis status: Secondary | ICD-10-CM

## 2021-09-04 DIAGNOSIS — E669 Obesity, unspecified: Secondary | ICD-10-CM

## 2021-09-04 DIAGNOSIS — Z6831 Body mass index (BMI) 31.0-31.9, adult: Secondary | ICD-10-CM

## 2021-09-04 DIAGNOSIS — I5032 Chronic diastolic (congestive) heart failure: Secondary | ICD-10-CM | POA: Diagnosis not present

## 2021-09-04 DIAGNOSIS — D509 Iron deficiency anemia, unspecified: Secondary | ICD-10-CM | POA: Diagnosis not present

## 2021-09-04 DIAGNOSIS — G7000 Myasthenia gravis without (acute) exacerbation: Secondary | ICD-10-CM | POA: Diagnosis not present

## 2021-09-04 DIAGNOSIS — G309 Alzheimer's disease, unspecified: Secondary | ICD-10-CM | POA: Diagnosis not present

## 2021-09-04 DIAGNOSIS — G4733 Obstructive sleep apnea (adult) (pediatric): Secondary | ICD-10-CM | POA: Diagnosis not present

## 2021-09-04 DIAGNOSIS — Z9181 History of falling: Secondary | ICD-10-CM

## 2021-09-04 DIAGNOSIS — Z7952 Long term (current) use of systemic steroids: Secondary | ICD-10-CM

## 2021-09-04 DIAGNOSIS — I110 Hypertensive heart disease with heart failure: Secondary | ICD-10-CM | POA: Diagnosis not present

## 2021-09-04 DIAGNOSIS — E785 Hyperlipidemia, unspecified: Secondary | ICD-10-CM | POA: Diagnosis not present

## 2021-09-04 DIAGNOSIS — Z87891 Personal history of nicotine dependence: Secondary | ICD-10-CM

## 2021-09-04 DIAGNOSIS — N400 Enlarged prostate without lower urinary tract symptoms: Secondary | ICD-10-CM

## 2021-09-04 DIAGNOSIS — K219 Gastro-esophageal reflux disease without esophagitis: Secondary | ICD-10-CM | POA: Diagnosis not present

## 2021-09-04 DIAGNOSIS — G4700 Insomnia, unspecified: Secondary | ICD-10-CM | POA: Diagnosis not present

## 2021-09-04 DIAGNOSIS — J1100 Influenza due to unidentified influenza virus with unspecified type of pneumonia: Secondary | ICD-10-CM | POA: Diagnosis not present

## 2021-09-04 DIAGNOSIS — F0280 Dementia in other diseases classified elsewhere without behavioral disturbance: Secondary | ICD-10-CM | POA: Diagnosis not present

## 2021-09-06 DIAGNOSIS — Z981 Arthrodesis status: Secondary | ICD-10-CM | POA: Diagnosis not present

## 2021-09-06 DIAGNOSIS — D509 Iron deficiency anemia, unspecified: Secondary | ICD-10-CM | POA: Diagnosis not present

## 2021-09-06 DIAGNOSIS — K219 Gastro-esophageal reflux disease without esophagitis: Secondary | ICD-10-CM | POA: Diagnosis not present

## 2021-09-06 DIAGNOSIS — Z9181 History of falling: Secondary | ICD-10-CM | POA: Diagnosis not present

## 2021-09-06 DIAGNOSIS — G7000 Myasthenia gravis without (acute) exacerbation: Secondary | ICD-10-CM | POA: Diagnosis not present

## 2021-09-06 DIAGNOSIS — I5032 Chronic diastolic (congestive) heart failure: Secondary | ICD-10-CM | POA: Diagnosis not present

## 2021-09-06 DIAGNOSIS — Z96653 Presence of artificial knee joint, bilateral: Secondary | ICD-10-CM | POA: Diagnosis not present

## 2021-09-06 DIAGNOSIS — G4733 Obstructive sleep apnea (adult) (pediatric): Secondary | ICD-10-CM | POA: Diagnosis not present

## 2021-09-06 DIAGNOSIS — G309 Alzheimer's disease, unspecified: Secondary | ICD-10-CM | POA: Diagnosis not present

## 2021-09-06 DIAGNOSIS — G4700 Insomnia, unspecified: Secondary | ICD-10-CM | POA: Diagnosis not present

## 2021-09-06 DIAGNOSIS — Z792 Long term (current) use of antibiotics: Secondary | ICD-10-CM | POA: Diagnosis not present

## 2021-09-06 DIAGNOSIS — E785 Hyperlipidemia, unspecified: Secondary | ICD-10-CM | POA: Diagnosis not present

## 2021-09-06 DIAGNOSIS — I110 Hypertensive heart disease with heart failure: Secondary | ICD-10-CM | POA: Diagnosis not present

## 2021-09-06 DIAGNOSIS — Z7952 Long term (current) use of systemic steroids: Secondary | ICD-10-CM | POA: Diagnosis not present

## 2021-09-06 DIAGNOSIS — Z87891 Personal history of nicotine dependence: Secondary | ICD-10-CM | POA: Diagnosis not present

## 2021-09-11 DIAGNOSIS — E785 Hyperlipidemia, unspecified: Secondary | ICD-10-CM | POA: Diagnosis not present

## 2021-09-11 DIAGNOSIS — Z981 Arthrodesis status: Secondary | ICD-10-CM | POA: Diagnosis not present

## 2021-09-11 DIAGNOSIS — Z9181 History of falling: Secondary | ICD-10-CM | POA: Diagnosis not present

## 2021-09-11 DIAGNOSIS — Z7952 Long term (current) use of systemic steroids: Secondary | ICD-10-CM | POA: Diagnosis not present

## 2021-09-11 DIAGNOSIS — Z96653 Presence of artificial knee joint, bilateral: Secondary | ICD-10-CM | POA: Diagnosis not present

## 2021-09-11 DIAGNOSIS — D509 Iron deficiency anemia, unspecified: Secondary | ICD-10-CM | POA: Diagnosis not present

## 2021-09-11 DIAGNOSIS — G4700 Insomnia, unspecified: Secondary | ICD-10-CM | POA: Diagnosis not present

## 2021-09-11 DIAGNOSIS — I5032 Chronic diastolic (congestive) heart failure: Secondary | ICD-10-CM | POA: Diagnosis not present

## 2021-09-11 DIAGNOSIS — Z87891 Personal history of nicotine dependence: Secondary | ICD-10-CM | POA: Diagnosis not present

## 2021-09-11 DIAGNOSIS — I110 Hypertensive heart disease with heart failure: Secondary | ICD-10-CM | POA: Diagnosis not present

## 2021-09-11 DIAGNOSIS — G4733 Obstructive sleep apnea (adult) (pediatric): Secondary | ICD-10-CM | POA: Diagnosis not present

## 2021-09-11 DIAGNOSIS — G309 Alzheimer's disease, unspecified: Secondary | ICD-10-CM | POA: Diagnosis not present

## 2021-09-11 DIAGNOSIS — Z792 Long term (current) use of antibiotics: Secondary | ICD-10-CM | POA: Diagnosis not present

## 2021-09-11 DIAGNOSIS — K219 Gastro-esophageal reflux disease without esophagitis: Secondary | ICD-10-CM | POA: Diagnosis not present

## 2021-09-11 DIAGNOSIS — G7000 Myasthenia gravis without (acute) exacerbation: Secondary | ICD-10-CM | POA: Diagnosis not present

## 2021-09-12 DIAGNOSIS — D509 Iron deficiency anemia, unspecified: Secondary | ICD-10-CM | POA: Diagnosis not present

## 2021-09-12 DIAGNOSIS — G4733 Obstructive sleep apnea (adult) (pediatric): Secondary | ICD-10-CM | POA: Diagnosis not present

## 2021-09-12 DIAGNOSIS — I110 Hypertensive heart disease with heart failure: Secondary | ICD-10-CM | POA: Diagnosis not present

## 2021-09-12 DIAGNOSIS — G7000 Myasthenia gravis without (acute) exacerbation: Secondary | ICD-10-CM | POA: Diagnosis not present

## 2021-09-12 DIAGNOSIS — Z9181 History of falling: Secondary | ICD-10-CM | POA: Diagnosis not present

## 2021-09-12 DIAGNOSIS — G309 Alzheimer's disease, unspecified: Secondary | ICD-10-CM | POA: Diagnosis not present

## 2021-09-12 DIAGNOSIS — Z87891 Personal history of nicotine dependence: Secondary | ICD-10-CM | POA: Diagnosis not present

## 2021-09-12 DIAGNOSIS — Z792 Long term (current) use of antibiotics: Secondary | ICD-10-CM | POA: Diagnosis not present

## 2021-09-12 DIAGNOSIS — G4700 Insomnia, unspecified: Secondary | ICD-10-CM | POA: Diagnosis not present

## 2021-09-12 DIAGNOSIS — Z981 Arthrodesis status: Secondary | ICD-10-CM | POA: Diagnosis not present

## 2021-09-12 DIAGNOSIS — I5032 Chronic diastolic (congestive) heart failure: Secondary | ICD-10-CM | POA: Diagnosis not present

## 2021-09-12 DIAGNOSIS — Z96653 Presence of artificial knee joint, bilateral: Secondary | ICD-10-CM | POA: Diagnosis not present

## 2021-09-12 DIAGNOSIS — K219 Gastro-esophageal reflux disease without esophagitis: Secondary | ICD-10-CM | POA: Diagnosis not present

## 2021-09-12 DIAGNOSIS — Z7952 Long term (current) use of systemic steroids: Secondary | ICD-10-CM | POA: Diagnosis not present

## 2021-09-12 DIAGNOSIS — E785 Hyperlipidemia, unspecified: Secondary | ICD-10-CM | POA: Diagnosis not present

## 2021-09-18 DIAGNOSIS — Z792 Long term (current) use of antibiotics: Secondary | ICD-10-CM | POA: Diagnosis not present

## 2021-09-18 DIAGNOSIS — E785 Hyperlipidemia, unspecified: Secondary | ICD-10-CM | POA: Diagnosis not present

## 2021-09-18 DIAGNOSIS — G309 Alzheimer's disease, unspecified: Secondary | ICD-10-CM | POA: Diagnosis not present

## 2021-09-18 DIAGNOSIS — Z7952 Long term (current) use of systemic steroids: Secondary | ICD-10-CM | POA: Diagnosis not present

## 2021-09-18 DIAGNOSIS — Z87891 Personal history of nicotine dependence: Secondary | ICD-10-CM | POA: Diagnosis not present

## 2021-09-18 DIAGNOSIS — G4700 Insomnia, unspecified: Secondary | ICD-10-CM | POA: Diagnosis not present

## 2021-09-18 DIAGNOSIS — Z96653 Presence of artificial knee joint, bilateral: Secondary | ICD-10-CM | POA: Diagnosis not present

## 2021-09-18 DIAGNOSIS — I5032 Chronic diastolic (congestive) heart failure: Secondary | ICD-10-CM | POA: Diagnosis not present

## 2021-09-18 DIAGNOSIS — G4733 Obstructive sleep apnea (adult) (pediatric): Secondary | ICD-10-CM | POA: Diagnosis not present

## 2021-09-18 DIAGNOSIS — Z9181 History of falling: Secondary | ICD-10-CM | POA: Diagnosis not present

## 2021-09-18 DIAGNOSIS — Z981 Arthrodesis status: Secondary | ICD-10-CM | POA: Diagnosis not present

## 2021-09-18 DIAGNOSIS — D509 Iron deficiency anemia, unspecified: Secondary | ICD-10-CM | POA: Diagnosis not present

## 2021-09-18 DIAGNOSIS — G7000 Myasthenia gravis without (acute) exacerbation: Secondary | ICD-10-CM | POA: Diagnosis not present

## 2021-09-18 DIAGNOSIS — I110 Hypertensive heart disease with heart failure: Secondary | ICD-10-CM | POA: Diagnosis not present

## 2021-09-18 DIAGNOSIS — K219 Gastro-esophageal reflux disease without esophagitis: Secondary | ICD-10-CM | POA: Diagnosis not present

## 2021-09-20 ENCOUNTER — Other Ambulatory Visit: Payer: Self-pay | Admitting: Internal Medicine

## 2021-10-04 ENCOUNTER — Other Ambulatory Visit: Payer: Self-pay | Admitting: Internal Medicine

## 2021-10-07 ENCOUNTER — Other Ambulatory Visit (INDEPENDENT_AMBULATORY_CARE_PROVIDER_SITE_OTHER): Payer: Medicare Other

## 2021-10-07 ENCOUNTER — Ambulatory Visit (INDEPENDENT_AMBULATORY_CARE_PROVIDER_SITE_OTHER): Payer: Medicare Other

## 2021-10-07 ENCOUNTER — Other Ambulatory Visit: Payer: Self-pay

## 2021-10-07 DIAGNOSIS — J189 Pneumonia, unspecified organism: Secondary | ICD-10-CM

## 2021-10-07 DIAGNOSIS — I5032 Chronic diastolic (congestive) heart failure: Secondary | ICD-10-CM | POA: Diagnosis not present

## 2021-10-07 DIAGNOSIS — F0280 Dementia in other diseases classified elsewhere without behavioral disturbance: Secondary | ICD-10-CM

## 2021-10-07 DIAGNOSIS — G309 Alzheimer's disease, unspecified: Secondary | ICD-10-CM

## 2021-10-07 LAB — CBC WITH DIFFERENTIAL/PLATELET
Basophils Absolute: 0 10*3/uL (ref 0.0–0.1)
Basophils Relative: 0.8 % (ref 0.0–3.0)
Eosinophils Absolute: 0.2 10*3/uL (ref 0.0–0.7)
Eosinophils Relative: 2.8 % (ref 0.0–5.0)
HCT: 44 % (ref 39.0–52.0)
Hemoglobin: 14.3 g/dL (ref 13.0–17.0)
Lymphocytes Relative: 15.3 % (ref 12.0–46.0)
Lymphs Abs: 0.8 10*3/uL (ref 0.7–4.0)
MCHC: 32.4 g/dL (ref 30.0–36.0)
MCV: 91 fl (ref 78.0–100.0)
Monocytes Absolute: 0.8 10*3/uL (ref 0.1–1.0)
Monocytes Relative: 14.1 % — ABNORMAL HIGH (ref 3.0–12.0)
Neutro Abs: 3.6 10*3/uL (ref 1.4–7.7)
Neutrophils Relative %: 67 % (ref 43.0–77.0)
Platelets: 172 10*3/uL (ref 150.0–400.0)
RBC: 4.84 Mil/uL (ref 4.22–5.81)
RDW: 14.7 % (ref 11.5–15.5)
WBC: 5.3 10*3/uL (ref 4.0–10.5)

## 2021-10-08 LAB — COMPREHENSIVE METABOLIC PANEL
ALT: 11 U/L (ref 0–53)
AST: 15 U/L (ref 0–37)
Albumin: 4.2 g/dL (ref 3.5–5.2)
Alkaline Phosphatase: 62 U/L (ref 39–117)
BUN: 16 mg/dL (ref 6–23)
CO2: 32 mEq/L (ref 19–32)
Calcium: 9.7 mg/dL (ref 8.4–10.5)
Chloride: 103 mEq/L (ref 96–112)
Creatinine, Ser: 0.81 mg/dL (ref 0.40–1.50)
GFR: 83.23 mL/min (ref >60.00)
Glucose, Bld: 93 mg/dL (ref 70–99)
Potassium: 4.1 mEq/L (ref 3.5–5.1)
Sodium: 140 mEq/L (ref 135–145)
Total Bilirubin: 0.7 mg/dL (ref 0.2–1.2)
Total Protein: 6.5 g/dL (ref 6.0–8.3)

## 2021-10-10 ENCOUNTER — Other Ambulatory Visit: Payer: Self-pay

## 2021-10-10 ENCOUNTER — Ambulatory Visit (INDEPENDENT_AMBULATORY_CARE_PROVIDER_SITE_OTHER): Payer: Medicare Other | Admitting: Internal Medicine

## 2021-10-10 ENCOUNTER — Encounter: Payer: Self-pay | Admitting: Internal Medicine

## 2021-10-10 DIAGNOSIS — G309 Alzheimer's disease, unspecified: Secondary | ICD-10-CM | POA: Diagnosis not present

## 2021-10-10 DIAGNOSIS — G5622 Lesion of ulnar nerve, left upper limb: Secondary | ICD-10-CM | POA: Diagnosis not present

## 2021-10-10 DIAGNOSIS — F0280 Dementia in other diseases classified elsewhere without behavioral disturbance: Secondary | ICD-10-CM

## 2021-10-10 DIAGNOSIS — E538 Deficiency of other specified B group vitamins: Secondary | ICD-10-CM

## 2021-10-10 DIAGNOSIS — G7000 Myasthenia gravis without (acute) exacerbation: Secondary | ICD-10-CM | POA: Diagnosis not present

## 2021-10-10 DIAGNOSIS — G5620 Lesion of ulnar nerve, unspecified upper limb: Secondary | ICD-10-CM | POA: Insufficient documentation

## 2021-10-10 NOTE — Assessment & Plan Note (Signed)
On B complex 

## 2021-10-10 NOTE — Progress Notes (Signed)
Subjective:  Patient ID: [name], male    DOB: [birth year]  Age: 81 y.o. MRN: [id number]  CC: No chief complaint on file.   HPI [name] presents for L ulnar aspect numbness, CAP, HTN  Outpatient Medications Prior to Visit  Medication Sig Dispense Refill   Cholecalciferol (VITAMIN D3) 2000 units TABS Take 2,000 Units by mouth at bedtime.     Coenzyme Q10 (CO Q-10 PO) Take 1 tablet by mouth at bedtime.     Cyanocobalamin (VITAMIN B-12) 1000 MCG SUBL Place 1 tablet (1,000 mcg total) under the tongue daily. (Patient taking differently: Place 1,000 mcg under the tongue at bedtime.) 100 tablet 3   donepezil (ARICEPT) 10 MG tablet Take 1 tablet by mouth at bedtime 30 tablet 11   famotidine (PEPCID) 40 MG tablet Take 1 tablet by mouth at bedtime (Patient taking differently: Take 40 mg by mouth at bedtime.) 30 tablet 11   finasteride (PROSCAR) 5 MG tablet Take 1 tablet (5 mg total) by mouth daily. 90 tablet 3   furosemide (LASIX) 20 MG tablet TAKE 1 TO 2 TABLETS BY  MOUTH DAILY IN THE EVENING (Patient taking differently: Take 20 mg by mouth every evening.) 270 tablet 1   guaiFENesin (MUCINEX) 600 MG 12 hr tablet Take 1 tablet (600 mg total) by mouth 2 (two) times daily as needed for cough.     LORazepam (ATIVAN) 1 MG tablet Take 1 tablet (1 mg total) by mouth 2 (two) times daily as needed for anxiety. (Patient taking differently: Take 1 mg by mouth daily as needed for anxiety.) 60 tablet 1   memantine (NAMENDA) 10 MG tablet Take 1 tablet (10 mg total) by mouth 2 (two) times daily. 180 tablet 3   mycophenolate (CELLCEPT) 250 MG capsule Take 500 mg by mouth 2 (two) times daily.     potassium chloride (KLOR-CON) 10 MEQ tablet Take 1 tablet by mouth twice daily 60 tablet 11   pyridostigmine (MESTINON) 60 MG tablet One in AM , one at lunch, 2 at bedtime. (Patient taking differently: Take 60 mg by mouth 2 (two) times daily.) 450 tablet 3   sertraline (ZOLOFT) 100 MG tablet Take 1  tablet by mouth every morning 30 tablet 11   tamsulosin (FLOMAX) 0.4 MG CAPS capsule Take 2 capsules by mouth every day (Patient taking differently: Take 0.8 mg by mouth daily.) 60 capsule 5   vitamin C (ASCORBIC ACID) 500 MG tablet Take 500 mg by mouth daily.     No facility-administered medications prior to visit.    ROS: Review of Systems  Constitutional:  Positive for fatigue. Negative for appetite change and unexpected weight change.  HENT:  Negative for congestion, nosebleeds, sneezing, sore throat and trouble swallowing.   Eyes:  Negative for itching and visual disturbance.  Respiratory:  Negative for cough.   Cardiovascular:  Negative for chest pain, palpitations and leg swelling.  Gastrointestinal:  Negative for abdominal distention, blood in stool, diarrhea and nausea.  Genitourinary:  Negative for frequency and hematuria.  Musculoskeletal:  Positive for arthralgias. Negative for back pain, gait problem, joint swelling and neck pain.  Skin:  Negative for rash.  Neurological:  Negative for dizziness, tremors, speech difficulty and weakness.  Psychiatric/Behavioral:  Negative for agitation, dysphoric mood and sleep disturbance. The patient is not nervous/anxious.    Objective:  BP 122/76 (BP Location: Right Arm, Patient Position: Sitting, Cuff Size: Large)    Pulse 72    Temp 98.2 F (36.8  C) (Oral)    Ht 6' (1.829 m)    Wt 237 lb (107.5 kg)    SpO2 95%    BMI 32.14 kg/m   BP Readings from Last 3 Encounters:  10/10/21 122/76  09/02/21 110/64  08/25/21 (!) 141/77    Wt Readings from Last 3 Encounters:  10/10/21 237 lb (107.5 kg)  09/02/21 235 lb 6.4 oz (106.8 kg)  08/25/21 245 lb 8 oz (111.4 kg)    Physical Exam Constitutional:      General: He is not in acute distress.    Appearance: He is well-developed.     Comments: NAD  Eyes:     Conjunctiva/sclera: Conjunctivae normal.     Pupils: Pupils are equal, round, and reactive to light.  Neck:     Thyroid: No  thyromegaly.     Vascular: No JVD.  Cardiovascular:     Rate and Rhythm: Normal rate and regular rhythm.     Heart sounds: Normal heart sounds. No murmur heard.   No friction rub. No gallop.  Pulmonary:     Effort: Pulmonary effort is normal. No respiratory distress.     Breath sounds: Normal breath sounds. No wheezing or rales.  Chest:     Chest wall: No tenderness.  Abdominal:     General: Bowel sounds are normal. There is no distension.     Palpations: Abdomen is soft. There is no mass.     Tenderness: There is no abdominal tenderness. There is no guarding or rebound.  Musculoskeletal:        General: No tenderness. Normal range of motion.     Cervical back: Normal range of motion.  Lymphadenopathy:     Cervical: No cervical adenopathy.  Skin:    General: Skin is warm and dry.     Findings: No rash.  Neurological:     Mental Status: He is alert and oriented to person, place, and time.     Cranial Nerves: No cranial nerve deficit.     Motor: No abnormal muscle tone.     Coordination: Coordination normal.     Gait: Gait normal.     Deep Tendon Reflexes: Reflexes are normal and symmetric.  Psychiatric:        Behavior: Behavior normal.        Thought Content: Thought content normal.        Judgment: Judgment normal.    Lab Results  Component Value Date   WBC 5.3 10/07/2021   HGB 14.3 10/07/2021   HCT 44.0 10/07/2021   PLT 172.0 10/07/2021   GLUCOSE 93 10/07/2021   CHOL 246 (H) 03/28/2021   TRIG 107.0 03/28/2021   HDL 62.80 03/28/2021   LDLDIRECT 157.5 07/05/2008   LDLCALC 162 (H) 03/28/2021   ALT 11 10/07/2021   AST 15 10/07/2021   NA 140 10/07/2021   K 4.1 10/07/2021   CL 103 10/07/2021   CREATININE 0.81 10/07/2021   BUN 16 10/07/2021   CO2 32 10/07/2021   TSH 1.06 03/28/2021   PSA 0.21 03/28/2021   INR 1.1 08/23/2021   HGBA1C 5.5 02/05/2018    DG Chest Port 1 View  Result Date: 08/23/2021 CLINICAL DATA:  Concern for sepsis.  Cough and congestion.  EXAM: PORTABLE CHEST 1 VIEW COMPARISON:  02/28/2021 FINDINGS: Chronic asymmetric elevation of left hemidiaphragm, which obscures the left heart border. Opacity overlying the elevated left hemidiaphragm may represent pneumonia and/or atelectasis. Right lung appears clear. No signs of pleural effusion or interstitial edema. IMPRESSION:  1. Chronic asymmetric elevation of left hemidiaphragm. 2. Opacity overlying the left hemidiaphragm may represent pneumonia and/or atelectasis. Electronically Signed   By: Kerby Moors M.D.   On: 08/23/2021 08:32    Assessment & Plan:   Problem List Items Addressed This Visit     Alzheimer disease ([place])    Cont on Aricept. Added Namenda      B12 deficiency    On B complex      Myasthenia gravis, AChR antibody positive (HCC)    Cont on Cellcept, Mestinon, Prednisone qod      Ulnar neuropathy at wrist    L ulnar aspect numbness  Prop/pad L arm/wrist Can use a splint Ortho ref offered         No orders of the defined types were placed in this encounter.     Follow-up: Return in about 4 months (around 02/07/2022) for a follow-up visit.  [name], MD

## 2021-10-10 NOTE — Assessment & Plan Note (Signed)
Cont on Aricept. Added Namenda

## 2021-10-10 NOTE — Assessment & Plan Note (Signed)
Cont on Cellcept, Mestinon, Prednisone qod

## 2021-10-10 NOTE — Assessment & Plan Note (Signed)
L ulnar aspect numbness  Prop/pad L arm/wrist Can use a splint Ortho ref offered

## 2021-10-10 NOTE — Patient Instructions (Signed)
For a mild COVID-19 case - take zinc 50 mg a day for 1 week, vitamin C 1000 mg daily for 1 week, vitamin D2 50,000 units weekly for 2 months (unless  taking vitamin D daily already), an antioxidant Quercetin 500 mg twice a day for 1 week (if you can get it quick enough). Take Allegra or Benadryl.  Maintain good oral hydration and take Tylenol for high fever.  Call if problems. Isolate for 5 days, then wear a mask for 5 days per CDC.  

## 2021-10-16 ENCOUNTER — Other Ambulatory Visit: Payer: Self-pay | Admitting: Internal Medicine

## 2021-10-20 ENCOUNTER — Other Ambulatory Visit: Payer: Self-pay | Admitting: Internal Medicine

## 2021-10-30 DIAGNOSIS — H903 Sensorineural hearing loss, bilateral: Secondary | ICD-10-CM | POA: Diagnosis not present

## 2021-11-19 ENCOUNTER — Other Ambulatory Visit: Payer: Self-pay | Admitting: Internal Medicine

## 2021-12-13 ENCOUNTER — Telehealth: Payer: Medicare Other

## 2021-12-13 NOTE — Progress Notes (Incomplete)
? ?Chronic Care Management ?Pharmacy Note ? ?12/13/2021 ?Name:  [name] MRN:  [id number] DOB:  [birth year] ? ?Summary: ?- Spoke with patient and his wife [name] who helps manage his medications  ?-Notes that since last appointment, memantine has been started and prednisone dosage has been adjusted  ?-BP well controlled in office, no current issues or concerns  ?-Reports that no changes have been seen since starting memantine - dosage increased to BID dosing about 1 week ago  ?-Patient doing better with adjustment in prednisone dosing (42m/5mg daily alternating dose) ? ?Recommendations/Changes made from today's visit: ?-Recommending no changes to medications at this time, to reach out with any issues or concerns  ?-Would recommend for discussion of statin medication retrial with next PCP appointment  ? ?Subjective: ?Lee DOBEKis an 81y.o. year old male who is a primary patient of [name], AEvie Lacks MD.  The CCM team was consulted for assistance with disease management and care coordination needs.   ? ?Engaged with patient by telephone for follow up visit in response to provider referral for pharmacy case management and/or care coordination services.  ? ?Consent to Services:  ?The patient was given the following information about Chronic Care Management services today, agreed to services, and gave verbal consent: 1. CCM service includes personalized support from designated clinical staff supervised by the primary care provider, including individualized plan of care and coordination with other care providers 2. 24/7 contact phone numbers for assistance for urgent and routine care needs. 3. Service will only be billed when office clinical staff spend 20 minutes or more in a month to coordinate care. 4. Only one practitioner may furnish and bill the service in a calendar month. 5.The patient may stop CCM services at any time (effective at the end of the month) by phone call to the office staff. 6.  The patient will be responsible for cost sharing (co-pay) of up to 20% of the service fee (after annual deductible is met). Patient agreed to services and consent obtained. ? ?Patient Care Team: ?[name], AEvie Lacks MD as PCP - General ?DCalvert Cantor MD (Ophthalmology) ?ADrema Halon MD as Referring Physician (Hematology) ?LEstill Batten MD as Referring Physician (Neurology) ?MWerner LeanJMartiniqueLee, MD as Referring Physician (Neurology) ?STomasa Blase [facility] Pharmacist (Pharmacist) ? ?Recent office visits:  ?10/10/2021 - Dr. PAlain Marion-no changes to medications - f/u in 4 months  ?09/02/2021 - Dr. PAlain Marion- hospital f/u - CAP / influenza  -f/u in 3 months  ? ?Recent consult visits:  ?None since last visit  ? ?[facility]:  [facility] rx'd doxycycline and prednisone taper / tamiflu on discharge   ? ?Objective: ? ?Lab Results  ?Component Value Date  ? CREATININE 0.81 10/07/2021  ? BUN 16 10/07/2021  ? GFR 83.23 10/07/2021  ? GFRNONAA >60 08/25/2021  ? GFRAA >60 04/23/2020  ? NA 140 10/07/2021  ? K 4.1 10/07/2021  ? CALCIUM 9.7 10/07/2021  ? CO2 32 10/07/2021  ? GLUCOSE 93 10/07/2021  ? ? ?Lab Results  ?Component Value Date/Time  ? HGBA1C 5.5 02/05/2018 12:05 PM  ? HGBA1C 4.6 (L) 12/01/2017 05:12 AM  ? GFR 83.23 10/07/2021 12:05 PM  ? GFR 84.18 03/28/2021 10:29 AM  ?  ?Last diabetic Eye exam:  ?No results found for: HMDIABEYEEXA  ?Last diabetic Foot exam:  ?No results found for: HMDIABFOOTEX  ? ?Lab Results  ?Component Value Date  ? CHOL 246 (H) 03/28/2021  ?  HDL 62.80 03/28/2021  ? LDLCALC 162 (H) 03/28/2021  ? LDLDIRECT 157.5 07/05/2008  ? TRIG 107.0 03/28/2021  ? CHOLHDL 4 03/28/2021  ? ? ? ?  Latest Ref Rng & Units 10/07/2021  ? 12:05 PM 08/23/2021  ?  8:15 AM 03/28/2021  ? 10:29 AM  ?Hepatic Function  ?Total Protein 6.0 - 8.3 g/dL 6.5   5.6   6.2    ?Albumin 3.5 - 5.2 g/dL 4.2   3.4   4.1    ?AST 0 - 37 U/L _0 ?ALT 0 - 53 U/L _1 ?Alk Phosphatase 39 - 117 U/L 62   65   49    ?Total Bilirubin 0.2 - 1.2 mg/dL 0.7   1.0   0.8    ? ? ?Lab Results  ?Component Value Date/Time  ? TSH 1.06 03/28/2021 10:29 AM  ? TSH 1.17 05/23/2019 09:03 AM  ? FREET4 1.27 (H) 12/03/2017 02:30 PM  ? ? ? ?  Latest Ref Rng & Units 10/07/2021  ? 12:05 PM 08/25/2021  ?  3:39 AM 08/24/2021  ?  4:09 AM  ?CBC  ?WBC 4.0 - 10.5 K/uL 5.3   3.9   5.7    ?Hemoglobin 13.0 - 17.0 g/dL 14.3   12.3   11.5    ?Hematocrit 39.0 - 52.0 % 44.0   38.4   36.8    ?Platelets 150.0 - 400.0 K/uL 172.0   132   112    ? ? ?Lab Results  ?Component Value Date/Time  ? VD25OH 40.97 05/23/2019 09:03 AM  ? ? ?Clinical ASCVD: Yes  ?The ASCVD Risk score (Arnett DK, et al., 2019) failed to calculate for the following reasons: ?  The 2019 ASCVD risk score is only valid for ages 26 to 72 ?  The patient has a prior MI or stroke diagnosis   ? ? ?  05/16/2021  ?  2:08 PM 04/04/2021  ?  9:33 AM 01/17/2020  ? 10:41 AM  ?Depression screen PHQ 2/9  ?Decreased Interest 0 0  ? 0 0  ?Down, Depressed, Hopeless 0 0  ? 0 0  ?PHQ - 2 Score 0 0  ? 0 0  ?Altered sleeping  0   ?Tired, decreased energy  0   ?Change in appetite  0   ?Feeling bad or failure about yourself   0   ?Trouble concentrating  0   ?Moving slowly or fidgety/restless  0   ?Suicidal thoughts  0   ?PHQ-9 Score  0   ? ? ?Social History  ? ?Tobacco Use  ?Smoking Status Former  ? Types: Cigarettes  ? Quit date: 01/08/1987  ? Years since quitting: 34.9  ?Smokeless Tobacco Never  ? ?BP Readings from Last 3 Encounters:  ?10/10/21 122/76  ?09/02/21 110/64  ?08/25/21 (!) 141/77  ? ?Pulse Readings from Last 3 Encounters:  ?10/10/21 72  ?09/02/21 61  ?08/25/21 (!) 59  ? ?Wt Readings from Last 3 Encounters:  ?10/10/21 237 lb (107.5 kg)  ?09/02/21 235 lb 6.4 oz (106.8 kg)  ?08/25/21 245 lb 8 oz (111.4 kg)  ? ?BMI Readings from Last 3 Encounters:  ?10/10/21 32.14 kg/m?  ?09/02/21 31.93 kg/m?  ?08/25/21 33.30 kg/m?  ? ? ?Assessment/Interventions: Review of patient past  medical history, allergies, medications, health status, including review of consultants reports, laboratory and other test data, was performed as part of comprehensive evaluation and  provision of chronic care management services.  ? ?SDOH:  (Social Determinants of Health) assessments and interventions performed: Yes ? ?SDOH Screenings  ? ?Alcohol Screen: Low Risk   ? Last Alcohol Screening Score (AUDIT): 1  ?Depression (PHQ2-9): Low Risk   ? PHQ-2 Score: 0  ?Financial Resource Strain: Low Risk   ? Difficulty of Paying Living Expenses: Not hard at all  ?Food Insecurity: No Food Insecurity  ? Worried About Charity fundraiser in the Last Year: Never true  ? Ran Out of Food in the Last Year: Never true  ?Housing: Low Risk   ? Last Housing Risk Score: 0  ?Physical Activity: Sufficiently Active  ? Days of Exercise per Week: 5 days  ? Minutes of Exercise per Session: 30 min  ?Social Connections: Socially Integrated  ? Frequency of Communication with Friends and Family: More than three times a week  ? Frequency of Social Gatherings with Friends and Family: More than three times a week  ? Attends Religious Services: More than 4 times per year  ? Active Member of Clubs or Organizations: Yes  ? Attends Archivist Meetings: More than 4 times per year  ? Marital Status: Married  ?Stress: No Stress Concern Present  ? Feeling of Stress : Not at all  ?Tobacco Use: Medium Risk  ? Smoking Tobacco Use: Former  ? Smokeless Tobacco Use: Never  ? Passive Exposure: Not on file  ?Transportation Needs: No Transportation Needs  ? Lack of Transportation (Medical): No  ? Lack of Transportation (Non-Medical): No  ? ? ?CCM Care Plan ? ?Allergies  ?Allergen Reactions  ? Ferumoxytol Other (See Comments)  ?  Stroke like symptoms; has hx of MG as well   ? ? ?Medications Reviewed Today   ? ? Reviewed by Cassandria Anger, MD (Physician) on 10/10/21 at 1053  Med List Status: <None>  ? ?Medication Order Taking? Sig Documenting Provider  Last Dose Status Informant  ?Cholecalciferol (VITAMIN D3) 2000 units TABS [id number] Yes Take 2,000 Units by mouth at bedtime. [provider] Taking Active Spouse/Significant Other  ?Coenzyme Q1

## 2021-12-19 DIAGNOSIS — G4733 Obstructive sleep apnea (adult) (pediatric): Secondary | ICD-10-CM | POA: Diagnosis not present

## 2022-01-21 DIAGNOSIS — Z5181 Encounter for therapeutic drug level monitoring: Secondary | ICD-10-CM | POA: Diagnosis not present

## 2022-01-21 DIAGNOSIS — G7000 Myasthenia gravis without (acute) exacerbation: Secondary | ICD-10-CM | POA: Diagnosis not present

## 2022-01-23 DIAGNOSIS — H903 Sensorineural hearing loss, bilateral: Secondary | ICD-10-CM | POA: Diagnosis not present

## 2022-01-30 ENCOUNTER — Other Ambulatory Visit (INDEPENDENT_AMBULATORY_CARE_PROVIDER_SITE_OTHER): Payer: Medicare Other

## 2022-01-30 DIAGNOSIS — G5622 Lesion of ulnar nerve, left upper limb: Secondary | ICD-10-CM

## 2022-01-30 DIAGNOSIS — E538 Deficiency of other specified B group vitamins: Secondary | ICD-10-CM

## 2022-01-30 DIAGNOSIS — G7000 Myasthenia gravis without (acute) exacerbation: Secondary | ICD-10-CM

## 2022-01-30 DIAGNOSIS — G309 Alzheimer's disease, unspecified: Secondary | ICD-10-CM | POA: Diagnosis not present

## 2022-01-30 DIAGNOSIS — F0280 Dementia in other diseases classified elsewhere without behavioral disturbance: Secondary | ICD-10-CM | POA: Diagnosis not present

## 2022-01-30 LAB — CBC WITH DIFFERENTIAL/PLATELET
Basophils Absolute: 0 10*3/uL (ref 0.0–0.1)
Basophils Relative: 0.8 % (ref 0.0–3.0)
Eosinophils Absolute: 0.1 10*3/uL (ref 0.0–0.7)
Eosinophils Relative: 2.5 % (ref 0.0–5.0)
HCT: 44.9 % (ref 39.0–52.0)
Hemoglobin: 14.8 g/dL (ref 13.0–17.0)
Lymphocytes Relative: 20.2 % (ref 12.0–46.0)
Lymphs Abs: 0.9 10*3/uL (ref 0.7–4.0)
MCHC: 32.9 g/dL (ref 30.0–36.0)
MCV: 91.3 fl (ref 78.0–100.0)
Monocytes Absolute: 0.8 10*3/uL (ref 0.1–1.0)
Monocytes Relative: 17.1 % — ABNORMAL HIGH (ref 3.0–12.0)
Neutro Abs: 2.7 10*3/uL (ref 1.4–7.7)
Neutrophils Relative %: 59.4 % (ref 43.0–77.0)
Platelets: 173 10*3/uL (ref 150.0–400.0)
RBC: 4.92 Mil/uL (ref 4.22–5.81)
RDW: 14.1 % (ref 11.5–15.5)
WBC: 4.5 10*3/uL (ref 4.0–10.5)

## 2022-01-30 LAB — COMPREHENSIVE METABOLIC PANEL
ALT: 10 U/L (ref 0–53)
AST: 15 U/L (ref 0–37)
Albumin: 4.3 g/dL (ref 3.5–5.2)
Alkaline Phosphatase: 67 U/L (ref 39–117)
BUN: 13 mg/dL (ref 6–23)
CO2: 28 mEq/L (ref 19–32)
Calcium: 10 mg/dL (ref 8.4–10.5)
Chloride: 102 mEq/L (ref 96–112)
Creatinine, Ser: 0.71 mg/dL (ref 0.40–1.50)
GFR: 86.42 mL/min (ref >60.00)
Glucose, Bld: 95 mg/dL (ref 70–99)
Potassium: 3.8 mEq/L (ref 3.5–5.1)
Sodium: 140 mEq/L (ref 135–145)
Total Bilirubin: 1 mg/dL (ref 0.2–1.2)
Total Protein: 6.8 g/dL (ref 6.0–8.3)

## 2022-01-30 LAB — VITAMIN B12: Vitamin B-12: 1077 pg/mL — ABNORMAL HIGH (ref 211–911)

## 2022-01-30 LAB — TSH: TSH: 1.04 u[IU]/mL (ref 0.35–5.50)

## 2022-02-10 ENCOUNTER — Ambulatory Visit (INDEPENDENT_AMBULATORY_CARE_PROVIDER_SITE_OTHER): Payer: Medicare Other | Admitting: Internal Medicine

## 2022-02-10 ENCOUNTER — Encounter: Payer: Self-pay | Admitting: Internal Medicine

## 2022-02-10 DIAGNOSIS — F0280 Dementia in other diseases classified elsewhere without behavioral disturbance: Secondary | ICD-10-CM

## 2022-02-10 DIAGNOSIS — H9190 Unspecified hearing loss, unspecified ear: Secondary | ICD-10-CM | POA: Insufficient documentation

## 2022-02-10 DIAGNOSIS — I2510 Atherosclerotic heart disease of native coronary artery without angina pectoris: Secondary | ICD-10-CM | POA: Diagnosis not present

## 2022-02-10 DIAGNOSIS — G7000 Myasthenia gravis without (acute) exacerbation: Secondary | ICD-10-CM | POA: Diagnosis not present

## 2022-02-10 DIAGNOSIS — H903 Sensorineural hearing loss, bilateral: Secondary | ICD-10-CM | POA: Diagnosis not present

## 2022-02-10 DIAGNOSIS — G309 Alzheimer's disease, unspecified: Secondary | ICD-10-CM

## 2022-02-10 NOTE — Assessment & Plan Note (Signed)
Off ASA due to GI bleed

## 2022-02-10 NOTE — Progress Notes (Signed)
Subjective:  Patient ID: CHASYN CINQUE, male    DOB: 1941-08-13  Age: 81 y.o. MRN: [id number]  CC: No chief complaint on file.   HPI Warnie Belair Kubin presents for hearing loss w/a hearing aid (25%). Cochlear implant is being planned F/u on MG, HTN, B12 def  Outpatient Medications Prior to Visit  Medication Sig Dispense Refill   Cholecalciferol (VITAMIN D3) 2000 units TABS Take 2,000 Units by mouth at bedtime.     Coenzyme Q10 (CO Q-10 PO) Take 1 tablet by mouth at bedtime.     Cyanocobalamin (VITAMIN B-12) 1000 MCG SUBL Place 1 tablet (1,000 mcg total) under the tongue daily. (Patient taking differently: Place 1,000 mcg under the tongue at bedtime.) 100 tablet 3   donepezil (ARICEPT) 10 MG tablet Take 1 tablet by mouth at bedtime 30 tablet 11   famotidine (PEPCID) 40 MG tablet Take 1 tablet by mouth at bedtime (Patient taking differently: Take 40 mg by mouth at bedtime.) 30 tablet 11   finasteride (PROSCAR) 5 MG tablet Take 1 tablet (5 mg total) by mouth daily. 90 tablet 3   furosemide (LASIX) 20 MG tablet Take 1 or 2 tablets by mouth every evening 60 tablet 11   guaiFENesin (MUCINEX) 600 MG 12 hr tablet Take 1 tablet (600 mg total) by mouth 2 (two) times daily as needed for cough.     LORazepam (ATIVAN) 1 MG tablet Take 1 tablet (1 mg total) by mouth 2 (two) times daily as needed for anxiety. (Patient taking differently: Take 1 mg by mouth daily as needed for anxiety.) 60 tablet 1   memantine (NAMENDA) 10 MG tablet TAKE 1 TABLET BY MOUTH TWICE A DAY 180 tablet 3   mycophenolate (CELLCEPT) 250 MG capsule Take 500 mg by mouth 2 (two) times daily.     potassium chloride (KLOR-CON) 10 MEQ tablet Take 1 tablet by mouth twice daily 60 tablet 11   pyridostigmine (MESTINON) 60 MG tablet One in AM , one at lunch, 2 at bedtime. (Patient taking differently: Take 60 mg by mouth 2 (two) times daily.) 450 tablet 3   sertraline (ZOLOFT) 100 MG tablet Take 1 tablet by mouth every morning 30 tablet  11   tamsulosin (FLOMAX) 0.4 MG CAPS capsule Take 2 capsules by mouth every day 60 capsule 11   vitamin C (ASCORBIC ACID) 500 MG tablet Take 500 mg by mouth daily.     mycophenolate (CELLCEPT) 500 MG tablet Take by mouth.     pyridostigmine (MESTINON) 60 MG tablet Take by mouth.     No facility-administered medications prior to visit.    ROS: Review of Systems  Constitutional:  Negative for appetite change, fatigue and unexpected weight change.  HENT:  Positive for hearing loss. Negative for congestion, nosebleeds, sneezing, sore throat and trouble swallowing.   Eyes:  Negative for itching and visual disturbance.  Respiratory:  Negative for cough.   Cardiovascular:  Negative for chest pain, palpitations and leg swelling.  Gastrointestinal:  Negative for abdominal distention, blood in stool, diarrhea and nausea.  Genitourinary:  Negative for frequency and hematuria.  Musculoskeletal:  Positive for arthralgias and gait problem. Negative for back pain, joint swelling and neck pain.  Skin:  Negative for rash.  Neurological:  Negative for dizziness, tremors, speech difficulty and weakness.  Psychiatric/Behavioral:  Negative for agitation, dysphoric mood, sleep disturbance and suicidal ideas. The patient is not nervous/anxious.     Objective:  BP 128/80 (BP Location: Left Arm, Patient Position: Sitting,  Cuff Size: Normal)   Pulse (!) 55   Temp 97.8 F (36.6 C) (Oral)   Ht 6' (1.829 m)   Wt 224 lb (101.6 kg)   SpO2 95%   BMI 30.38 kg/m   BP Readings from Last 3 Encounters:  02/10/22 128/80  10/10/21 122/76  09/02/21 110/64    Wt Readings from Last 3 Encounters:  02/10/22 224 lb (101.6 kg)  10/10/21 237 lb (107.5 kg)  09/02/21 235 lb 6.4 oz (106.8 kg)    Physical Exam Constitutional:      General: He is not in acute distress.    Appearance: He is well-developed. He is obese.     Comments: NAD  Eyes:     Conjunctiva/sclera: Conjunctivae normal.     Pupils: Pupils are  equal, round, and reactive to light.  Neck:     Thyroid: No thyromegaly.     Vascular: No JVD.  Cardiovascular:     Rate and Rhythm: Normal rate and regular rhythm.     Heart sounds: Normal heart sounds. No murmur heard.    No friction rub. No gallop.  Pulmonary:     Effort: Pulmonary effort is normal. No respiratory distress.     Breath sounds: Normal breath sounds. No wheezing or rales.  Chest:     Chest wall: No tenderness.  Abdominal:     General: Bowel sounds are normal. There is no distension.     Palpations: Abdomen is soft. There is no mass.     Tenderness: There is no abdominal tenderness. There is no guarding or rebound.  Musculoskeletal:        General: No tenderness. Normal range of motion.     Cervical back: Normal range of motion.  Lymphadenopathy:     Cervical: No cervical adenopathy.  Skin:    General: Skin is warm and dry.     Findings: No rash.  Neurological:     Mental Status: He is alert and oriented to person, place, and time.     Cranial Nerves: No cranial nerve deficit.     Motor: No abnormal muscle tone.     Coordination: Coordination normal.     Gait: Gait normal.     Deep Tendon Reflexes: Reflexes are normal and symmetric.  Psychiatric:        Behavior: Behavior normal.        Thought Content: Thought content normal.        Judgment: Judgment normal.     Lab Results  Component Value Date   WBC 4.5 01/30/2022   HGB 14.8 01/30/2022   HCT 44.9 01/30/2022   PLT 173.0 01/30/2022   GLUCOSE 95 01/30/2022   CHOL 246 (H) 03/28/2021   TRIG 107.0 03/28/2021   HDL 62.80 03/28/2021   LDLDIRECT 157.5 07/05/2008   LDLCALC 162 (H) 03/28/2021   ALT 10 01/30/2022   AST 15 01/30/2022   NA 140 01/30/2022   K 3.8 01/30/2022   CL 102 01/30/2022   CREATININE 0.71 01/30/2022   BUN 13 01/30/2022   CO2 28 01/30/2022   TSH 1.04 01/30/2022   PSA 0.21 03/28/2021   INR 1.1 08/23/2021   HGBA1C 5.5 02/05/2018    DG Chest Port 1 View  Result Date:  08/23/2021 CLINICAL DATA:  Concern for sepsis.  Cough and congestion. EXAM: PORTABLE CHEST 1 VIEW COMPARISON:  02/28/2021 FINDINGS: Chronic asymmetric elevation of left hemidiaphragm, which obscures the left heart border. Opacity overlying the elevated left hemidiaphragm may represent pneumonia and/or atelectasis. Right  lung appears clear. No signs of pleural effusion or interstitial edema. IMPRESSION: 1. Chronic asymmetric elevation of left hemidiaphragm. 2. Opacity overlying the left hemidiaphragm may represent pneumonia and/or atelectasis. Electronically Signed   By: Kerby Moors M.D.   On: 08/23/2021 08:32    Assessment & Plan:   Problem List Items Addressed This Visit     Alzheimer disease ([place])    Worse - hearing loss w/a hearing aid (25%). Cochlear implant is being planned in [facility]       Coronary atherosclerosis    Off ASA due to GI bleed       Hearing loss    Worse - hearing loss w/a hearing aid (25%). Cochlear implant is being planned in [place]      Myasthenia gravis, AChR antibody positive ([place])    Off Prednisone 01/2022         No orders of the defined types were placed in this encounter.     Follow-up: Return in about 4 months (around 06/12/2022) for a follow-up visit.  [name], MD

## 2022-02-10 NOTE — Assessment & Plan Note (Addendum)
Worse - hearing loss w/a hearing aid (25%). Cochlear implant is being planned in [place]

## 2022-02-10 NOTE — Assessment & Plan Note (Signed)
Worse - hearing loss w/a hearing aid (25%). Cochlear implant is being planned in [place]

## 2022-02-10 NOTE — Assessment & Plan Note (Signed)
Off Prednisone 01/2022

## 2022-02-13 ENCOUNTER — Ambulatory Visit: Payer: Medicare Other | Admitting: Adult Health

## 2022-02-13 DIAGNOSIS — H903 Sensorineural hearing loss, bilateral: Secondary | ICD-10-CM | POA: Diagnosis not present

## 2022-02-20 DIAGNOSIS — L03114 Cellulitis of left upper limb: Secondary | ICD-10-CM | POA: Diagnosis not present

## 2022-02-20 DIAGNOSIS — W57XXXA Bitten or stung by nonvenomous insect and other nonvenomous arthropods, initial encounter: Secondary | ICD-10-CM | POA: Diagnosis not present

## 2022-03-11 DIAGNOSIS — H903 Sensorineural hearing loss, bilateral: Secondary | ICD-10-CM | POA: Diagnosis not present

## 2022-03-19 ENCOUNTER — Encounter: Payer: Self-pay | Admitting: Internal Medicine

## 2022-03-19 ENCOUNTER — Ambulatory Visit (INDEPENDENT_AMBULATORY_CARE_PROVIDER_SITE_OTHER): Payer: Medicare Other | Admitting: Internal Medicine

## 2022-03-19 VITALS — BP 128/64 | HR 64 | Temp 98.2°F | Ht 72.0 in | Wt 231.0 lb

## 2022-03-19 DIAGNOSIS — G309 Alzheimer's disease, unspecified: Secondary | ICD-10-CM | POA: Diagnosis not present

## 2022-03-19 DIAGNOSIS — R21 Rash and other nonspecific skin eruption: Secondary | ICD-10-CM | POA: Diagnosis not present

## 2022-03-19 DIAGNOSIS — F02818 Dementia in other diseases classified elsewhere, unspecified severity, with other behavioral disturbance: Secondary | ICD-10-CM

## 2022-03-19 DIAGNOSIS — F3341 Major depressive disorder, recurrent, in partial remission: Secondary | ICD-10-CM | POA: Diagnosis not present

## 2022-03-19 MED ORDER — ARIPIPRAZOLE 2 MG PO TABS: 2.0000 mg | ORAL_TABLET | Freq: Every day | ORAL | 0 refills | Status: DC

## 2022-03-19 NOTE — Progress Notes (Unsigned)
Subjective:  Patient ID: [name], male    DOB: [birth year]  Age: 81 y.o. MRN: [id number]  CC: Rash   HPI CRISTINO DEGROFF presents for f/up -  His wife did complains about his behavior.  He is having episodes of agitation, screaming, and irritability.  She would like for him to add something to his current regimen to help calm down the behavior.  He complains of a red, irritated area on his left volar forearm.  He was in [place] about 5 weeks ago when the symptoms started.  He tells me he has been treated with an antibiotic but the area still bothers him.  Outpatient Medications Prior to Visit  Medication Sig Dispense Refill   Cholecalciferol (VITAMIN D3) 2000 units TABS Take 2,000 Units by mouth at bedtime.     Coenzyme Q10 (CO Q-10 PO) Take 1 tablet by mouth at bedtime.     Cyanocobalamin (VITAMIN B-12) 1000 MCG SUBL Place 1 tablet (1,000 mcg total) under the tongue daily. (Patient taking differently: Place 1,000 mcg under the tongue at bedtime.) 100 tablet 3   donepezil (ARICEPT) 10 MG tablet Take 1 tablet by mouth at bedtime 30 tablet 11   famotidine (PEPCID) 40 MG tablet Take 1 tablet by mouth at bedtime (Patient taking differently: Take 40 mg by mouth at bedtime.) 30 tablet 11   finasteride (PROSCAR) 5 MG tablet Take 1 tablet (5 mg total) by mouth daily. 90 tablet 3   furosemide (LASIX) 20 MG tablet Take 1 or 2 tablets by mouth every evening 60 tablet 11   guaiFENesin (MUCINEX) 600 MG 12 hr tablet Take 1 tablet (600 mg total) by mouth 2 (two) times daily as needed for cough.     LORazepam (ATIVAN) 1 MG tablet Take 1 tablet (1 mg total) by mouth 2 (two) times daily as needed for anxiety. (Patient taking differently: Take 1 mg by mouth daily as needed for anxiety.) 60 tablet 1   memantine (NAMENDA) 10 MG tablet TAKE 1 TABLET BY MOUTH TWICE A DAY 180 tablet 3   mycophenolate (CELLCEPT) 250 MG capsule Take 500 mg by mouth 2 (two) times daily.     potassium chloride (KLOR-CON)  10 MEQ tablet Take 1 tablet by mouth twice daily 60 tablet 11   pyridostigmine (MESTINON) 60 MG tablet One in AM , one at lunch, 2 at bedtime. (Patient taking differently: Take 60 mg by mouth 2 (two) times daily.) 450 tablet 3   sertraline (ZOLOFT) 100 MG tablet Take 1 tablet by mouth every morning 30 tablet 11   tamsulosin (FLOMAX) 0.4 MG CAPS capsule Take 2 capsules by mouth every day 60 capsule 11   vitamin C (ASCORBIC ACID) 500 MG tablet Take 500 mg by mouth daily.     No facility-administered medications prior to visit.    ROS Review of Systems  Constitutional: Negative.   HENT: Negative.    Eyes: Negative.   Respiratory:  Negative for cough.   Cardiovascular:  Negative for chest pain, palpitations and leg swelling.  Gastrointestinal:  Negative for abdominal pain, constipation and diarrhea.  Genitourinary: Negative.  Negative for difficulty urinating, penile swelling, testicular pain and urgency.  Skin:  Positive for color change and rash.  Neurological: Negative.   Hematological:  Negative for adenopathy. Does not bruise/bleed easily.  Psychiatric/Behavioral:  Positive for confusion, dysphoric mood and sleep disturbance.     Objective:  BP 128/64 (BP Location: Left Arm, Patient Position: Sitting, Cuff Size: Large)  Pulse 64   Temp 98.2 F (36.8 C) (Oral)   Ht 6' (1.829 m)   Wt 231 lb (104.8 kg)   SpO2 94%   BMI 31.33 kg/m   BP Readings from Last 3 Encounters:  03/19/22 128/64  02/10/22 128/80  10/10/21 122/76    Wt Readings from Last 3 Encounters:  03/19/22 231 lb (104.8 kg)  02/10/22 224 lb (101.6 kg)  10/10/21 237 lb (107.5 kg)    Physical Exam Vitals reviewed.  Eyes:     General: No scleral icterus.    Conjunctiva/sclera: Conjunctivae normal.  Cardiovascular:     Rate and Rhythm: Normal rate and regular rhythm.     Heart sounds: No murmur heard. Pulmonary:     Effort: Pulmonary effort is normal.     Breath sounds: No stridor. No wheezing, rhonchi or  rales.  Abdominal:     General: Abdomen is flat.     Palpations: There is no mass.     Tenderness: There is no abdominal tenderness.     Hernia: No hernia is present.  Musculoskeletal:     Cervical back: Neck supple.  Lymphadenopathy:     Cervical: No cervical adenopathy.  Skin:    Findings: Erythema and rash present.     Comments: Erythematous nodules, volar surface of left forearm.  Neurological:     General: No focal deficit present.     Mental Status: Mental status is at baseline. He is disoriented.  Psychiatric:        Attention and Perception: He is inattentive.        Mood and Affect: Mood normal.        Speech: Speech is delayed.        Behavior: Behavior normal. Behavior is cooperative.        Thought Content: Thought content normal.        Cognition and Memory: Cognition is impaired. Memory is impaired.     Lab Results  Component Value Date   WBC 4.5 01/30/2022   HGB 14.8 01/30/2022   HCT 44.9 01/30/2022   PLT 173.0 01/30/2022   GLUCOSE 95 01/30/2022   CHOL 246 (H) 03/28/2021   TRIG 107.0 03/28/2021   HDL 62.80 03/28/2021   LDLDIRECT 157.5 07/05/2008   LDLCALC 162 (H) 03/28/2021   ALT 10 01/30/2022   AST 15 01/30/2022   NA 140 01/30/2022   K 3.8 01/30/2022   CL 102 01/30/2022   CREATININE 0.71 01/30/2022   BUN 13 01/30/2022   CO2 28 01/30/2022   TSH 1.04 01/30/2022   PSA 0.21 03/28/2021   INR 1.1 08/23/2021   HGBA1C 5.5 02/05/2018    DG Chest Port 1 View  Result Date: 08/23/2021 CLINICAL DATA:  Concern for sepsis.  Cough and congestion. EXAM: PORTABLE CHEST 1 VIEW COMPARISON:  02/28/2021 FINDINGS: Chronic asymmetric elevation of left hemidiaphragm, which obscures the left heart border. Opacity overlying the elevated left hemidiaphragm may represent pneumonia and/or atelectasis. Right lung appears clear. No signs of pleural effusion or interstitial edema. IMPRESSION: 1. Chronic asymmetric elevation of left hemidiaphragm. 2. Opacity overlying the left  hemidiaphragm may represent pneumonia and/or atelectasis. Electronically Signed   By: Kerby Moors M.D.   On: 08/23/2021 08:32    Assessment & Plan:   Eliah was seen today for rash.  Diagnoses and all orders for this visit:  Recurrent major depressive disorder, in partial remission ([place]) -     ARIPiprazole (ABILIFY) 2 MG tablet; Take 1 tablet (2 mg total) by  mouth daily.  Alzheimer's disease with behavioral disturbance ([place]) -     ARIPiprazole (ABILIFY) 2 MG tablet; Take 1 tablet (2 mg total) by mouth daily.  Nodular rash- If this does not resolve he will return to undergo a biopsy.   I am having Anuel Sitter. Trieu start on ARIPiprazole. I am also having him maintain his Vitamin B-12, Coenzyme Q10 (CO Q-10 PO), Vitamin D3, pyridostigmine, LORazepam, ascorbic acid, famotidine, finasteride, mycophenolate, donepezil, guaiFENesin, sertraline, potassium chloride, memantine, tamsulosin, and furosemide.  Meds ordered this encounter  Medications   ARIPiprazole (ABILIFY) 2 MG tablet    Sig: Take 1 tablet (2 mg total) by mouth daily.    Dispense:  90 tablet    Refill:  0     Follow-up: Return in about 3 months (around 06/19/2022).  Scarlette Calico, MD

## 2022-03-19 NOTE — Patient Instructions (Signed)
Dementia Dementia is a condition that affects the way the brain functions. It often affects memory and thinking. Usually, dementia gets worse with time and cannot be reversed (progressive dementia). There are many types of dementia, including: Alzheimer's disease. This type is the most common. Vascular dementia. This type may happen as the result of a stroke. Lewy body dementia. This type may happen to people who have Parkinson's disease. Frontotemporal dementia. This type is caused by damage to nerve cells (neurons) in certain parts of the brain. Some people may be affected by more than one type of dementia. This is called mixed dementia. What are the causes? Dementia is caused by damage to cells in the brain. The area of the brain and the types of cells damaged determine the type of dementia. Usually, this damage is irreversible or cannot be undone. Some examples of irreversible causes include: Conditions that affect the blood vessels of the brain, such as diabetes, heart disease, or blood vessel disease. Genetic mutations. In some cases, changes in the brain may be caused by another condition and can be reversed or slowed. Some examples of reversible causes include: Injury to the brain. Certain medicines. Infection, such as meningitis. Metabolic problems, such as vitamin B12 deficiency or thyroid disease. Pressure on the brain, such as from a tumor, blood clot, or too much fluid in the brain (hydrocephalus). Autoimmune diseases that affect the brain or arteries, such as limbic encephalitis or vasculitis. What are the signs or symptoms? Symptoms of dementia depend on the type of dementia. Common signs of dementia include problems with remembering, thinking, problem solving, decision making, and communicating. These signs develop slowly or get worse with time. This may include: Problems remembering events or people. Having trouble taking a bath or putting clothes on. Forgetting appointments or  forgetting to pay bills. Difficulty planning and preparing meals. Having trouble speaking. Getting lost easily. Changes in behavior or mood. How is this diagnosed? This condition is diagnosed by a specialist (neurologist). It is diagnosed based on the history of your symptoms, your medical history, a physical exam, and tests. Tests may include: Tests to evaluate brain function, such as memory tests, cognitive tests, and other tests. Lab tests, such as blood or urine tests. Imaging tests, such as a CT scan, a PET scan, or an MRI. Genetic testing. This may be done if other family members have a diagnosis of certain types of dementia. Your health care provider will talk with you and your family, friends, or caregivers about your history and symptoms. How is this treated? Treatment for this condition depends on the cause of the dementia. Progressive dementias, such as Alzheimer's disease, cannot be cured, but there may be treatments that help to manage symptoms. Treatment might involve taking medicines that may help to: Control the dementia. Slow down the progression of the dementia. Manage symptoms. In some cases, treating the cause of your dementia can improve symptoms, reverse symptoms, or slow down how quickly your dementia becomes worse. Your health care provider can direct you to support groups, organizations, and other health care providers who can help with decisions about your care. Follow these instructions at home: Medicines Take over-the-counter and prescription medicines only as told by your health care provider. Use a pill organizer or pill reminder to help you manage your medicines. Avoid taking medicines that can affect thinking, such as pain medicines or sleeping medicines. Lifestyle Make healthy lifestyle choices. Be physically active as told by your health care provider. Do not use any   products that contain nicotine or tobacco, such as cigarettes, e-cigarettes, and chewing  tobacco. If you need help quitting, ask your health care provider. Do not drink alcohol. Practice stress-management techniques when you get stressed. Spend time with other people. Make sure to get quality sleep. These tips can help you get a good night's rest: Avoid napping during the day. Keep your sleeping area dark and cool. Avoid exercising during the few hours before you go to bed. Avoid caffeine products in the evening. Eating and drinking Drink enough fluid to keep your urine pale yellow. Eat a healthy diet. General instructions  Work with your health care provider to determine what you need help with and what your safety needs are. Talk with your health care provider about whether it is safe for you to drive. If you were given a bracelet that identifies you as a person with memory loss or tracks your location, make sure to wear it at all times. Work with your family to make important decisions, such as advance directives, medical power of attorney, or a living will. Keep all follow-up visits. This is important. Where to find more information Alzheimer's Association: www.alz.org National Institute on Aging: www.nia.nih.gov/alzheimers World Health Organization: www.who.int Contact a health care provider if: You have any new or worsening symptoms. You have problems with choking or swallowing. Get help right away if: You feel depressed or sad, or feel that you want to harm yourself. Your family members become concerned for your safety. If you ever feel like you may hurt yourself or others, or have thoughts about taking your own life, get help right away. Go to your nearest emergency department or: Call your local emergency services (911 in the [place] Call a suicide crisis helpline, such as the National Suicide Prevention Lifeline at 1-800-273-8255 or 988 in the [place] This is open 24 hours a day in the [place] Text the Crisis Text Line at 741741 (in the [place] Summary Dementia is a  condition that affects the way the brain functions. Dementia often affects memory and thinking. Usually, dementia gets worse with time and cannot be reversed (progressive dementia). Treatment for this condition depends on the cause of the dementia. Work with your health care provider to determine what you need help with and what your safety needs are. Your health care provider can direct you to support groups, organizations, and other health care providers who can help with decisions about your care. This information is not intended to replace advice given to you by your health care provider. Make sure you discuss any questions you have with your health care provider. Document Revised: 03/06/2021 Document Reviewed: 12/26/2019 Elsevier Patient Education  2023 Elsevier Inc.  

## 2022-03-20 DIAGNOSIS — R21 Rash and other nonspecific skin eruption: Secondary | ICD-10-CM | POA: Insufficient documentation

## 2022-03-20 DIAGNOSIS — G4733 Obstructive sleep apnea (adult) (pediatric): Secondary | ICD-10-CM | POA: Diagnosis not present

## 2022-03-21 ENCOUNTER — Encounter: Payer: Self-pay | Admitting: Internal Medicine

## 2022-03-21 ENCOUNTER — Ambulatory Visit (INDEPENDENT_AMBULATORY_CARE_PROVIDER_SITE_OTHER): Payer: Medicare Other | Admitting: Internal Medicine

## 2022-03-21 VITALS — BP 122/68 | HR 60 | Temp 98.0°F | Resp 16 | Ht 72.0 in | Wt 231.0 lb

## 2022-03-21 DIAGNOSIS — R21 Rash and other nonspecific skin eruption: Secondary | ICD-10-CM | POA: Diagnosis not present

## 2022-03-21 DIAGNOSIS — D485 Neoplasm of uncertain behavior of skin: Secondary | ICD-10-CM

## 2022-03-21 DIAGNOSIS — L720 Epidermal cyst: Secondary | ICD-10-CM | POA: Diagnosis not present

## 2022-03-21 NOTE — Progress Notes (Unsigned)
Subjective:  Patient ID: [name], male    DOB: February 03, 1941  Age: 81 y.o. MRN: [id number]  CC: No chief complaint on file.   HPI [name] presents for f/up -  About 4 weeks ago he developed a red, swollen, painful area on his left forearm.  He was seen at an urgent care center and was prescribed doxycycline.  Most of the symptoms have improved but he has persistent rash in the area that is slightly itchy.   Outpatient Medications Prior to Visit  Medication Sig Dispense Refill   ARIPiprazole (ABILIFY) 2 MG tablet Take 1 tablet (2 mg total) by mouth daily. 90 tablet 0   Cholecalciferol (VITAMIN D3) 2000 units TABS Take 2,000 Units by mouth at bedtime.     Coenzyme Q10 (CO Q-10 PO) Take 1 tablet by mouth at bedtime.     Cyanocobalamin (VITAMIN B-12) 1000 MCG SUBL Place 1 tablet (1,000 mcg total) under the tongue daily. (Patient taking differently: Place 1,000 mcg under the tongue at bedtime.) 100 tablet 3   donepezil (ARICEPT) 10 MG tablet Take 1 tablet by mouth at bedtime 30 tablet 11   famotidine (PEPCID) 40 MG tablet Take 1 tablet by mouth at bedtime (Patient taking differently: Take 40 mg by mouth at bedtime.) 30 tablet 11   finasteride (PROSCAR) 5 MG tablet Take 1 tablet (5 mg total) by mouth daily. 90 tablet 3   furosemide (LASIX) 20 MG tablet Take 1 or 2 tablets by mouth every evening 60 tablet 11   guaiFENesin (MUCINEX) 600 MG 12 hr tablet Take 1 tablet (600 mg total) by mouth 2 (two) times daily as needed for cough.     LORazepam (ATIVAN) 1 MG tablet Take 1 tablet (1 mg total) by mouth 2 (two) times daily as needed for anxiety. (Patient taking differently: Take 1 mg by mouth daily as needed for anxiety.) 60 tablet 1   memantine (NAMENDA) 10 MG tablet TAKE 1 TABLET BY MOUTH TWICE A DAY 180 tablet 3   mycophenolate (CELLCEPT) 250 MG capsule Take 500 mg by mouth 2 (two) times daily.     potassium chloride (KLOR-CON) 10 MEQ tablet Take 1 tablet by mouth twice daily  60 tablet 11   pyridostigmine (MESTINON) 60 MG tablet One in AM , one at lunch, 2 at bedtime. (Patient taking differently: Take 60 mg by mouth 2 (two) times daily.) 450 tablet 3   sertraline (ZOLOFT) 100 MG tablet Take 1 tablet by mouth every morning 30 tablet 11   tamsulosin (FLOMAX) 0.4 MG CAPS capsule Take 2 capsules by mouth every day 60 capsule 11   vitamin C (ASCORBIC ACID) 500 MG tablet Take 500 mg by mouth daily.     No facility-administered medications prior to visit.    ROS Review of Systems  Objective:  BP 122/68 (BP Location: Left Arm, Patient Position: Sitting, Cuff Size: Large)   Pulse 60   Temp 98 F (36.7 C) (Oral)   Resp 16   Ht 6' (1.829 m)   Wt 231 lb (104.8 kg)   SpO2 91%   BMI 31.33 kg/m   BP Readings from Last 3 Encounters:  03/21/22 122/68  03/19/22 128/64  02/10/22 128/80    Wt Readings from Last 3 Encounters:  03/21/22 231 lb (104.8 kg)  03/19/22 231 lb (104.8 kg)  02/10/22 224 lb (101.6 kg)    Physical Exam  Lab Results  Component Value Date   WBC 4.5 01/30/2022  HGB 14.8 01/30/2022   HCT 44.9 01/30/2022   PLT 173.0 01/30/2022   GLUCOSE 95 01/30/2022   CHOL 246 (H) 03/28/2021   TRIG 107.0 03/28/2021   HDL 62.80 03/28/2021   LDLDIRECT 157.5 07/05/2008   LDLCALC 162 (H) 03/28/2021   ALT 10 01/30/2022   AST 15 01/30/2022   NA 140 01/30/2022   K 3.8 01/30/2022   CL 102 01/30/2022   CREATININE 0.71 01/30/2022   BUN 13 01/30/2022   CO2 28 01/30/2022   TSH 1.04 01/30/2022   PSA 0.21 03/28/2021   INR 1.1 08/23/2021   HGBA1C 5.5 02/05/2018    DG Chest Port 1 View  Result Date: 08/23/2021 CLINICAL DATA:  Concern for sepsis.  Cough and congestion. EXAM: PORTABLE CHEST 1 VIEW COMPARISON:  02/28/2021 FINDINGS: Chronic asymmetric elevation of left hemidiaphragm, which obscures the left heart border. Opacity overlying the elevated left hemidiaphragm may represent pneumonia and/or atelectasis. Right lung appears clear. No signs of pleural  effusion or interstitial edema. IMPRESSION: 1. Chronic asymmetric elevation of left hemidiaphragm. 2. Opacity overlying the left hemidiaphragm may represent pneumonia and/or atelectasis. Electronically Signed   By: Kerby Moors M.D.   On: 08/23/2021 08:32    Assessment & Plan:   Diagnoses and all orders for this visit:  Neoplasm of uncertain behavior of skin  Nodular rash   I am having [name] maintain his Vitamin B-12, Coenzyme Q10 (CO Q-10 PO), Vitamin D3, pyridostigmine, LORazepam, ascorbic acid, famotidine, finasteride, mycophenolate, donepezil, guaiFENesin, sertraline, potassium chloride, memantine, tamsulosin, furosemide, and ARIPiprazole.  No orders of the defined types were placed in this encounter.    Follow-up: Return in about 1 week (around 03/28/2022).  Lee Calico, MD

## 2022-03-21 NOTE — Patient Instructions (Signed)
Skin Biopsy A skin biopsy is a procedure to remove a sample of your skin so that it can be checked for any disease. You may need a skin biopsy if you have a skin disease or abnormal changes on your skin (lesion). Tell a health care provider about: Any allergies you have. All medicines you are taking, including vitamins, herbs, eye drops, creams, and over-the-counter medicines. Any problems you or family members have had with anesthetic medicines. Any bleeding problems you have. Any surgeries you have had. Any medical conditions you have. Whether you are pregnant or may be pregnant. What are the risks? Generally, this is a safe procedure. However, problems may occur, including: Bleeding. Infection. Scarring. Allergic reaction to anesthetics, surgical materials, or ointments. What happens before the procedure? Medicines Ask your health care provider about: Changing or stopping your regular medicines. This is especially important if you are taking diabetes medicines or blood thinners. Taking medicines such as aspirin and ibuprofen. These medicines can thin your blood. Do not take these medicines unless your health care provider tells you to take them. Taking over-the-counter medicines, vitamins, herbs, and supplements. General instructions Follow instructions from your health care provider about eating or drinking restrictions. Ask your health care provider: How your surgery site will be marked. What steps will be taken to help prevent infection. These steps may include: Removing hair at the surgery site. Washing skin with a germ-killing soap. Taking antibiotic medicine. Ask your health care provider if you will need someone to take you home from the hospital or clinic after the procedure. What happens during the procedure?  You may be given medicine to numb the area (local anesthetic). Your health care provider will take a sample using one of these steps, depending on the type of skin  problem that you have: Shave biopsy. Your health care provider will shave away layers of your skin lesion with a sharp blade. After shaving, a gel or ointment may be used to control bleeding. Punch biopsy. Your health care provider will use a tool to remove all or part of the lesion. This leaves a small hole about the width of a pencil eraser. The area may be covered with a gel or ointment. Excisional or incisional biopsy. Your health care provider will use a surgical blade to remove all or part of your lesion. Your skin biopsy site may be closed with stitches (sutures). A bandage (dressing) will be applied. The procedure may vary among health care providers and hospitals. What happens after the procedure? Your skin sample will be sent to a lab for tests. Your skin biopsy site will be watched to make sure that it stops bleeding. You will be given instructions on how to care for your biopsy site. It is up to you to get the results of your procedure. Ask your health care provider, or the department that is doing the procedure, when your results will be ready. Summary A skin biopsy is a procedure to remove a sample of your skin (lesion) so that it can be checked under a microscope. Tell a health care provider about your medical history and all medicines you are taking, including vitamins, herbs, eye drops, creams, and over-the-counter medicines. Before the procedure, ask your health care provider about changing or stopping your regular medicines. During the procedure, your health care provider will take a skin sample from the area where you have the skin problem. After the procedure, your skin sample will be sent to a laboratory for testing. This   information is not intended to replace advice given to you by your health care provider. Make sure you discuss any questions you have with your health care provider. Document Revised: 03/12/2021 Document Reviewed: 03/12/2021 Elsevier Patient Education   2023 Elsevier Inc.  

## 2022-03-24 ENCOUNTER — Other Ambulatory Visit: Payer: Self-pay | Admitting: Internal Medicine

## 2022-04-01 ENCOUNTER — Ambulatory Visit (INDEPENDENT_AMBULATORY_CARE_PROVIDER_SITE_OTHER): Payer: Medicare Other | Admitting: Internal Medicine

## 2022-04-01 ENCOUNTER — Encounter: Payer: Self-pay | Admitting: Internal Medicine

## 2022-04-01 DIAGNOSIS — F02818 Dementia in other diseases classified elsewhere, unspecified severity, with other behavioral disturbance: Secondary | ICD-10-CM | POA: Diagnosis not present

## 2022-04-01 DIAGNOSIS — I2510 Atherosclerotic heart disease of native coronary artery without angina pectoris: Secondary | ICD-10-CM

## 2022-04-01 DIAGNOSIS — D485 Neoplasm of uncertain behavior of skin: Secondary | ICD-10-CM

## 2022-04-01 DIAGNOSIS — G309 Alzheimer's disease, unspecified: Secondary | ICD-10-CM | POA: Diagnosis not present

## 2022-04-01 DIAGNOSIS — G7000 Myasthenia gravis without (acute) exacerbation: Secondary | ICD-10-CM

## 2022-04-01 NOTE — Progress Notes (Signed)
Subjective:  Patient ID: [name], male    DOB: [birth year]  Age: 81 y.o. MRN: [id number]  CC: 1wk follow up   HPI [name] presents for a wound check Dr Ronnald Ramp gave Sonia Side Abilify Follow-up on coronary disease, myasthenia gravis  Outpatient Medications Prior to Visit  Medication Sig Dispense Refill   ARIPiprazole (ABILIFY) 2 MG tablet Take 1 tablet (2 mg total) by mouth daily. 90 tablet 0   Cholecalciferol (VITAMIN D3) 2000 units TABS Take 2,000 Units by mouth at bedtime.     Coenzyme Q10 (CO Q-10 PO) Take 1 tablet by mouth at bedtime.     Cyanocobalamin (VITAMIN B-12) 1000 MCG SUBL Place 1 tablet (1,000 mcg total) under the tongue daily. (Patient taking differently: Place 1,000 mcg under the tongue at bedtime.) 100 tablet 3   donepezil (ARICEPT) 10 MG tablet Take 1 tablet by mouth at bedtime 30 tablet 11   famotidine (PEPCID) 40 MG tablet Take 1 tablet by mouth at bedtime 30 tablet 11   finasteride (PROSCAR) 5 MG tablet Take 1 tablet (5 mg total) by mouth daily. 90 tablet 3   furosemide (LASIX) 20 MG tablet Take 1 or 2 tablets by mouth every evening 60 tablet 11   guaiFENesin (MUCINEX) 600 MG 12 hr tablet Take 1 tablet (600 mg total) by mouth 2 (two) times daily as needed for cough.     LORazepam (ATIVAN) 1 MG tablet Take 1 tablet (1 mg total) by mouth 2 (two) times daily as needed for anxiety. (Patient taking differently: Take 1 mg by mouth daily as needed for anxiety.) 60 tablet 1   memantine (NAMENDA) 10 MG tablet TAKE 1 TABLET BY MOUTH TWICE A DAY 180 tablet 3   mycophenolate (CELLCEPT) 250 MG capsule Take 500 mg by mouth 2 (two) times daily.     potassium chloride (KLOR-CON) 10 MEQ tablet Take 1 tablet by mouth twice daily 60 tablet 11   pyridostigmine (MESTINON) 60 MG tablet One in AM , one at lunch, 2 at bedtime. (Patient taking differently: Take 60 mg by mouth 2 (two) times daily.) 450 tablet 3   sertraline (ZOLOFT) 100 MG tablet Take 1 tablet by mouth every  morning 30 tablet 11   tamsulosin (FLOMAX) 0.4 MG CAPS capsule Take 2 capsules by mouth every day 60 capsule 11   vitamin C (ASCORBIC ACID) 500 MG tablet Take 500 mg by mouth daily.     No facility-administered medications prior to visit.    ROS: Review of Systems  Constitutional:  Negative for appetite change, fatigue and unexpected weight change.  HENT:  Negative for congestion, nosebleeds, sneezing, sore throat and trouble swallowing.   Eyes:  Negative for itching and visual disturbance.  Respiratory:  Negative for cough.   Cardiovascular:  Negative for chest pain, palpitations and leg swelling.  Gastrointestinal:  Negative for abdominal distention, blood in stool, diarrhea and nausea.  Genitourinary:  Negative for frequency and hematuria.  Musculoskeletal:  Positive for arthralgias, back pain and neck stiffness. Negative for gait problem, joint swelling and neck pain.  Skin:  Negative for rash.  Neurological:  Negative for dizziness, tremors, speech difficulty and weakness.  Psychiatric/Behavioral:  Positive for behavioral problems, confusion, decreased concentration and sleep disturbance. Negative for agitation, dysphoric mood and suicidal ideas. The patient is nervous/anxious.     Objective:  BP 120/66 (BP Location: Left Arm, Patient Position: Sitting, Cuff Size: Large)   Pulse 63   Temp 97.8 F (36.6 C) (Oral)  Ht 6' (1.829 m)   Wt 228 lb (103.4 kg)   SpO2 93%   BMI 30.92 kg/m   BP Readings from Last 3 Encounters:  04/01/22 120/66  03/21/22 122/68  03/19/22 128/64    Wt Readings from Last 3 Encounters:  04/01/22 228 lb (103.4 kg)  03/21/22 231 lb (104.8 kg)  03/19/22 231 lb (104.8 kg)    Physical Exam Constitutional:      General: He is not in acute distress.    Appearance: He is well-developed. He is obese.     Comments: NAD  Eyes:     Conjunctiva/sclera: Conjunctivae normal.     Pupils: Pupils are equal, round, and reactive to light.  Neck:     Thyroid:  No thyromegaly.     Vascular: No JVD.  Cardiovascular:     Rate and Rhythm: Normal rate and regular rhythm.     Heart sounds: Normal heart sounds. No murmur heard.    No friction rub. No gallop.  Pulmonary:     Effort: Pulmonary effort is normal. No respiratory distress.     Breath sounds: Normal breath sounds. No wheezing or rales.  Chest:     Chest wall: No tenderness.  Abdominal:     General: Bowel sounds are normal. There is no distension.     Palpations: Abdomen is soft. There is no mass.     Tenderness: There is no abdominal tenderness. There is no guarding or rebound.  Musculoskeletal:        General: Tenderness present. Normal range of motion.     Cervical back: Normal range of motion.  Lymphadenopathy:     Cervical: No cervical adenopathy.  Skin:    General: Skin is warm and dry.     Findings: No rash.  Neurological:     Mental Status: He is alert and oriented to person, place, and time. Mental status is at baseline.     Cranial Nerves: No cranial nerve deficit.     Motor: No abnormal muscle tone.     Coordination: Coordination normal.     Gait: Gait normal.     Deep Tendon Reflexes: Reflexes are normal and symmetric.  Psychiatric:        Behavior: Behavior normal.        Thought Content: Thought content normal.        Judgment: Judgment normal.    Wound is clean on the left forearm.  One suture has probably removed itself with a scab.  Band-Aid applied.  Biopsy report pending.  Lab Results  Component Value Date   WBC 4.5 01/30/2022   HGB 14.8 01/30/2022   HCT 44.9 01/30/2022   PLT 173.0 01/30/2022   GLUCOSE 95 01/30/2022   CHOL 246 (H) 03/28/2021   TRIG 107.0 03/28/2021   HDL 62.80 03/28/2021   LDLDIRECT 157.5 07/05/2008   LDLCALC 162 (H) 03/28/2021   ALT 10 01/30/2022   AST 15 01/30/2022   NA 140 01/30/2022   K 3.8 01/30/2022   CL 102 01/30/2022   CREATININE 0.71 01/30/2022   BUN 13 01/30/2022   CO2 28 01/30/2022   TSH 1.04 01/30/2022   PSA 0.21  03/28/2021   INR 1.1 08/23/2021   HGBA1C 5.5 02/05/2018    DG Chest Port 1 View  Result Date: 08/23/2021 CLINICAL DATA:  Concern for sepsis.  Cough and congestion. EXAM: PORTABLE CHEST 1 VIEW COMPARISON:  02/28/2021 FINDINGS: Chronic asymmetric elevation of left hemidiaphragm, which obscures the left heart border. Opacity overlying the  elevated left hemidiaphragm may represent pneumonia and/or atelectasis. Right lung appears clear. No signs of pleural effusion or interstitial edema. IMPRESSION: 1. Chronic asymmetric elevation of left hemidiaphragm. 2. Opacity overlying the left hemidiaphragm may represent pneumonia and/or atelectasis. Electronically Signed   By: Kerby Moors M.D.   On: 08/23/2021 08:32    Assessment & Plan:   Problem List Items Addressed This Visit     Alzheimer's disease with behavioral disturbance ([place])    Continue on Abilify - take Abilify in am Okay to use lorazepam prn-low-dose ; we may need to d/c Zoloft      Coronary atherosclerosis    No angina.  Continue on simvastatin      Myasthenia gravis, AChR antibody positive ([facility])    No change in plans. Cont on Cellcept, Mestinon      Neoplasm of uncertain behavior of skin    Bx is pending         No orders of the defined types were placed in this encounter.     Follow-up: Return in about 3 months (around 07/02/2022) for a follow-up visit.  [name], MD

## 2022-04-01 NOTE — Assessment & Plan Note (Signed)
Bx is pending

## 2022-04-01 NOTE — Assessment & Plan Note (Signed)
On Abilify - take in am Lorazepam prn We may need to d/c Zoloft

## 2022-04-02 NOTE — Assessment & Plan Note (Signed)
No change in plans. Cont on Cellcept, Mestinon

## 2022-04-02 NOTE — Assessment & Plan Note (Signed)
No angina.  Continue on simvastatin

## 2022-04-10 DIAGNOSIS — Z7982 Long term (current) use of aspirin: Secondary | ICD-10-CM | POA: Diagnosis not present

## 2022-04-10 DIAGNOSIS — G4730 Sleep apnea, unspecified: Secondary | ICD-10-CM | POA: Diagnosis not present

## 2022-04-10 DIAGNOSIS — H903 Sensorineural hearing loss, bilateral: Secondary | ICD-10-CM | POA: Diagnosis not present

## 2022-04-10 DIAGNOSIS — K903 Pancreatic steatorrhea: Secondary | ICD-10-CM | POA: Diagnosis not present

## 2022-04-10 DIAGNOSIS — Z96612 Presence of left artificial shoulder joint: Secondary | ICD-10-CM | POA: Diagnosis not present

## 2022-04-10 DIAGNOSIS — R410 Disorientation, unspecified: Secondary | ICD-10-CM | POA: Diagnosis not present

## 2022-04-10 DIAGNOSIS — G7000 Myasthenia gravis without (acute) exacerbation: Secondary | ICD-10-CM | POA: Diagnosis not present

## 2022-04-10 DIAGNOSIS — Z96653 Presence of artificial knee joint, bilateral: Secondary | ICD-10-CM | POA: Diagnosis not present

## 2022-04-10 DIAGNOSIS — Z8616 Personal history of COVID-19: Secondary | ICD-10-CM | POA: Diagnosis not present

## 2022-04-10 DIAGNOSIS — F05 Delirium due to known physiological condition: Secondary | ICD-10-CM | POA: Insufficient documentation

## 2022-04-10 DIAGNOSIS — F01B11 Vascular dementia, moderate, with agitation: Secondary | ICD-10-CM | POA: Insufficient documentation

## 2022-04-10 DIAGNOSIS — Z87891 Personal history of nicotine dependence: Secondary | ICD-10-CM | POA: Diagnosis not present

## 2022-04-10 DIAGNOSIS — Z888 Allergy status to other drugs, medicaments and biological substances status: Secondary | ICD-10-CM | POA: Diagnosis not present

## 2022-04-10 DIAGNOSIS — Z79899 Other long term (current) drug therapy: Secondary | ICD-10-CM | POA: Diagnosis not present

## 2022-04-10 HISTORY — PX: COCHLEAR IMPLANT: SUR684

## 2022-04-11 DIAGNOSIS — Z79899 Other long term (current) drug therapy: Secondary | ICD-10-CM | POA: Diagnosis not present

## 2022-04-11 DIAGNOSIS — H9190 Unspecified hearing loss, unspecified ear: Secondary | ICD-10-CM | POA: Diagnosis not present

## 2022-04-11 DIAGNOSIS — Z9621 Cochlear implant status: Secondary | ICD-10-CM | POA: Diagnosis not present

## 2022-04-11 DIAGNOSIS — D509 Iron deficiency anemia, unspecified: Secondary | ICD-10-CM | POA: Diagnosis not present

## 2022-04-11 DIAGNOSIS — G4733 Obstructive sleep apnea (adult) (pediatric): Secondary | ICD-10-CM | POA: Diagnosis not present

## 2022-04-11 DIAGNOSIS — Z9989 Dependence on other enabling machines and devices: Secondary | ICD-10-CM | POA: Diagnosis not present

## 2022-04-11 DIAGNOSIS — K219 Gastro-esophageal reflux disease without esophagitis: Secondary | ICD-10-CM | POA: Diagnosis not present

## 2022-04-11 DIAGNOSIS — Z888 Allergy status to other drugs, medicaments and biological substances status: Secondary | ICD-10-CM | POA: Diagnosis not present

## 2022-04-11 DIAGNOSIS — Z87891 Personal history of nicotine dependence: Secondary | ICD-10-CM | POA: Diagnosis not present

## 2022-04-11 DIAGNOSIS — G7000 Myasthenia gravis without (acute) exacerbation: Secondary | ICD-10-CM | POA: Diagnosis not present

## 2022-04-11 DIAGNOSIS — Z96653 Presence of artificial knee joint, bilateral: Secondary | ICD-10-CM | POA: Diagnosis not present

## 2022-04-14 ENCOUNTER — Ambulatory Visit (INDEPENDENT_AMBULATORY_CARE_PROVIDER_SITE_OTHER): Payer: Medicare Other

## 2022-04-14 DIAGNOSIS — Z0000 Encounter for general adult medical examination without abnormal findings: Secondary | ICD-10-CM

## 2022-04-14 NOTE — Patient Instructions (Signed)
[name] , Thank you for taking time to come for your Medicare Wellness Visit. I appreciate your ongoing commitment to your health goals. Please review the following plan we discussed and let me know if I can assist you in the future.   Screening recommendations/referrals: Colonoscopy: Last done at [facility]; Not a candidate for screening due to age Recommended yearly ophthalmology/optometry visit for glaucoma screening and checkup Recommended yearly dental visit for hygiene and checkup  Vaccinations: Influenza vaccine: due Fall 2023 Pneumococcal vaccine: 06/25/2006, 07/10/2014, 12/30/2016, 04/09/2020 Tdap vaccine: 06/04/2013; due every 10 years Shingles vaccine: 12/28/2016, 05/09/2017   Covid-19: 09/02/2019, 09/23/2019, 04/29/2020, 01/06/2022  Advanced directives: Yes; Please bring a copy of your health care power of attorney and living will to the office at your convenience.  Conditions/risks identified: Yes  Next appointment: Please schedule your next Medicare Wellness Visit with your Nurse Health Advisor in 1 year by calling [phone].  Preventive Care 81 Years and Older, Male Preventive care refers to lifestyle choices and visits with your health care provider that can promote health and wellness. What does preventive care include? A yearly physical exam. This is also called an annual well check. Dental exams once or twice a year. Routine eye exams. Ask your health care provider how often you should have your eyes checked. Personal lifestyle choices, including: Daily care of your teeth and gums. Regular physical activity. Eating a healthy diet. Avoiding tobacco and drug use. Limiting alcohol use. Practicing safe sex. Taking low doses of aspirin every day. Taking vitamin and mineral supplements as recommended by your health care provider. What happens during an annual well check? The services and screenings done by your health care provider during your annual well check will  depend on your age, overall health, lifestyle risk factors, and family history of disease. Counseling  Your health care provider may ask you questions about your: Alcohol use. Tobacco use. Drug use. Emotional well-being. Home and relationship well-being. Sexual activity. Eating habits. History of falls. Memory and ability to understand (cognition). Work and work Statistician. Screening  You may have the following tests or measurements: Height, weight, and BMI. Blood pressure. Lipid and cholesterol levels. These may be checked every 5 years, or more frequently if you are over 41 years old. Skin check. Lung cancer screening. You may have this screening every year starting at age 44 if you have a 30-pack-year history of smoking and currently smoke or have quit within the past 15 years. Fecal occult blood test (FOBT) of the stool. You may have this test every year starting at age 70. Flexible sigmoidoscopy or colonoscopy. You may have a sigmoidoscopy every 5 years or a colonoscopy every 10 years starting at age 75. Prostate cancer screening. Recommendations will vary depending on your family history and other risks. Hepatitis C blood test. Hepatitis B blood test. Sexually transmitted disease (STD) testing. Diabetes screening. This is done by checking your blood sugar (glucose) after you have not eaten for a while (fasting). You may have this done every 1-3 years. Abdominal aortic aneurysm (AAA) screening. You may need this if you are a current or former smoker. Osteoporosis. You may be screened starting at age 41 if you are at high risk. Talk with your health care provider about your test results, treatment options, and if necessary, the need for more tests. Vaccines  Your health care provider may recommend certain vaccines, such as: Influenza vaccine. This is recommended every year. Tetanus, diphtheria, and acellular pertussis (Tdap, Td) vaccine. You  may need a Td booster every 10  years. Zoster vaccine. You may need this after age 41. Pneumococcal 13-valent conjugate (PCV13) vaccine. One dose is recommended after age 80. Pneumococcal polysaccharide (PPSV23) vaccine. One dose is recommended after age 33. Talk to your health care provider about which screenings and vaccines you need and how often you need them. This information is not intended to replace advice given to you by your health care provider. Make sure you discuss any questions you have with your health care provider. Document Released: 09/07/2015 Document Revised: 04/30/2016 Document Reviewed: 06/12/2015 Elsevier Interactive Patient Education  2017 [place] Prevention in the Home Falls can cause injuries. They can happen to people of all ages. There are many things you can do to make your home safe and to help prevent falls. What can I do on the outside of my home? Regularly fix the edges of walkways and driveways and fix any cracks. Remove anything that might make you trip as you walk through a door, such as a raised step or threshold. Trim any bushes or trees on the path to your home. Use bright outdoor lighting. Clear any walking paths of anything that might make someone trip, such as rocks or tools. Regularly check to see if handrails are loose or broken. Make sure that both sides of any steps have handrails. Any raised decks and porches should have guardrails on the edges. Have any leaves, snow, or ice cleared regularly. Use sand or salt on walking paths during winter. Clean up any spills in your garage right away. This includes oil or grease spills. What can I do in the bathroom? Use night lights. Install grab bars by the toilet and in the tub and shower. Do not use towel bars as grab bars. Use non-skid mats or decals in the tub or shower. If you need to sit down in the shower, use a plastic, non-slip stool. Keep the floor dry. Clean up any water that spills on the floor as soon as it  happens. Remove soap buildup in the tub or shower regularly. Attach bath mats securely with double-sided non-slip rug tape. Do not have throw rugs and other things on the floor that can make you trip. What can I do in the bedroom? Use night lights. Make sure that you have a light by your bed that is easy to reach. Do not use any sheets or blankets that are too big for your bed. They should not hang down onto the floor. Have a firm chair that has side arms. You can use this for support while you get dressed. Do not have throw rugs and other things on the floor that can make you trip. What can I do in the kitchen? Clean up any spills right away. Avoid walking on wet floors. Keep items that you use a lot in easy-to-reach places. If you need to reach something above you, use a strong step stool that has a grab bar. Keep electrical cords out of the way. Do not use floor polish or wax that makes floors slippery. If you must use wax, use non-skid floor wax. Do not have throw rugs and other things on the floor that can make you trip. What can I do with my stairs? Do not leave any items on the stairs. Make sure that there are handrails on both sides of the stairs and use them. Fix handrails that are broken or loose. Make sure that handrails are as long as the  stairways. Check any carpeting to make sure that it is firmly attached to the stairs. Fix any carpet that is loose or worn. Avoid having throw rugs at the top or bottom of the stairs. If you do have throw rugs, attach them to the floor with carpet tape. Make sure that you have a light switch at the top of the stairs and the bottom of the stairs. If you do not have them, ask someone to add them for you. What else can I do to help prevent falls? Wear shoes that: Do not have high heels. Have rubber bottoms. Are comfortable and fit you well. Are closed at the toe. Do not wear sandals. If you use a stepladder: Make sure that it is fully opened.  Do not climb a closed stepladder. Make sure that both sides of the stepladder are locked into place. Ask someone to hold it for you, if possible. Clearly mark and make sure that you can see: Any grab bars or handrails. First and last steps. Where the edge of each step is. Use tools that help you move around (mobility aids) if they are needed. These include: Canes. Walkers. Scooters. Crutches. Turn on the lights when you go into a dark area. Replace any light bulbs as soon as they burn out. Set up your furniture so you have a clear path. Avoid moving your furniture around. If any of your floors are uneven, fix them. If there are any pets around you, be aware of where they are. Review your medicines with your doctor. Some medicines can make you feel dizzy. This can increase your chance of falling. Ask your doctor what other things that you can do to help prevent falls. This information is not intended to replace advice given to you by your health care provider. Make sure you discuss any questions you have with your health care provider. Document Released: 06/07/2009 Document Revised: 01/17/2016 Document Reviewed: 09/15/2014 Elsevier Interactive Patient Education  2017 [employer].

## 2022-04-14 NOTE — Progress Notes (Cosign Needed Addendum)
I connected with [name] today by telephone and verified that I am speaking with the correct person using two identifiers. Location patient: home Location provider: work Persons participating in the virtual visit: patient, Santina Evans (wife) and provider.   I discussed the limitations, risks, security and privacy concerns of performing an evaluation and management service by telephone and the availability of in person appointments. I also discussed with the patient that there may be a patient responsible charge related to this service. The patient expressed understanding and verbally consented to this telephonic visit.    Interactive audio and video telecommunications were attempted between this provider and patient, however failed, due to patient having technical difficulties OR patient did not have access to video capability.  We continued and completed visit with audio only.  Some vital signs may be absent or patient reported.   Time Spent with patient on telephone encounter: 30 minutes  Subjective:   [name] is a 81 y.o. male who presents for Medicare Annual/Subsequent preventive examination.  Review of Systems     Cardiac Risk Factors include: advanced age (>50mn, >>36women);dyslipidemia;family history of premature cardiovascular disease;hypertension;male gender;sedentary lifestyle     Objective:    There were no vitals filed for this visit. There is no height or weight on file to calculate BMI.     04/14/2022   10:19 AM 08/23/2021    7:54 AM 04/04/2021    9:35 AM 02/28/2021    9:57 AM 09/04/2020    9:10 AM 04/23/2020   11:19 AM 01/17/2020   10:41 AM  Advanced Directives  Does Patient Have a Medical Advance Directive? Yes No Yes No No No Yes  Type of Advance Directive Living will;Healthcare Power of Attorney  Living will;Healthcare Power of Attorney      Does patient want to make changes to medical advance directive? No - Patient declined  No - Patient declined     No - Patient declined  Copy of [place] Chart? No - copy requested  No - copy requested      Would patient like information on creating a medical advance directive?  No - Patient declined    No - Patient declined     Current Medications (verified) Outpatient Encounter Medications as of 04/14/2022  Medication Sig   ARIPiprazole (ABILIFY) 2 MG tablet Take 1 tablet (2 mg total) by mouth daily.   Cholecalciferol (VITAMIN D3) 2000 units TABS Take 2,000 Units by mouth at bedtime.   Coenzyme Q10 (CO Q-10 PO) Take 1 tablet by mouth at bedtime.   Cyanocobalamin (VITAMIN B-12) 1000 MCG SUBL Place 1 tablet (1,000 mcg total) under the tongue daily. (Patient taking differently: Place 1,000 mcg under the tongue at bedtime.)   donepezil (ARICEPT) 10 MG tablet Take 1 tablet by mouth at bedtime   famotidine (PEPCID) 40 MG tablet Take 1 tablet by mouth at bedtime   finasteride (PROSCAR) 5 MG tablet Take 1 tablet (5 mg total) by mouth daily.   furosemide (LASIX) 20 MG tablet Take 1 or 2 tablets by mouth every evening   guaiFENesin (MUCINEX) 600 MG 12 hr tablet Take 1 tablet (600 mg total) by mouth 2 (two) times daily as needed for cough.   LORazepam (ATIVAN) 1 MG tablet Take 1 tablet (1 mg total) by mouth 2 (two) times daily as needed for anxiety. (Patient taking differently: Take 1 mg by mouth daily as needed for anxiety.)   memantine (NAMENDA) 10 MG tablet TAKE 1  TABLET BY MOUTH TWICE A DAY   mycophenolate (CELLCEPT) 250 MG capsule Take 500 mg by mouth 2 (two) times daily.   potassium chloride (KLOR-CON) 10 MEQ tablet Take 1 tablet by mouth twice daily   pyridostigmine (MESTINON) 60 MG tablet One in AM , one at lunch, 2 at bedtime. (Patient taking differently: Take 60 mg by mouth 2 (two) times daily.)   sertraline (ZOLOFT) 100 MG tablet Take 1 tablet by mouth every morning   tamsulosin (FLOMAX) 0.4 MG CAPS capsule Take 2 capsules by mouth every day   vitamin C (ASCORBIC ACID) 500 MG  tablet Take 500 mg by mouth daily.   No facility-administered encounter medications on file as of 04/14/2022.    Allergies (verified) Ferumoxytol   History: Past Medical History:  Diagnosis Date   Anemia    Blood transfusion without reported diagnosis 02/23/2014   3 units for iron deficiency anemia. as a baby - whenhe had pneumonia   BPH (benign prostatic hyperplasia)    CTS (carpal tunnel syndrome)    better   Dementia (HCC)    Dyspnea    GERD (gastroesophageal reflux disease)    Hyperlipidemia    Hypertension    "THEY DIAGNOSED ME YEARS AGO BUT LATELY IVE ACTUALLY HAD LOW BLOOD PRESSURES "    Insomnia    LBP (low back pain)    Myasthenia gravis ([place])    Osteoarthritis    Sleep apnea    no cpap   Past Surgical History:  Procedure Laterality Date   CARDIAC CATHETERIZATION N/A 08/15/2015   Procedure: Left Heart Cath and Coronary Angiography;  Surgeon: Lorretta Harp, MD;  Location: [facility];  Service: Cardiovascular;  Laterality: N/A;   CARPAL TUNNEL RELEASE Bilateral 1996, 2004   Dr Lenoard Aden thumb surgery.  more than once   COLONOSCOPY  2005, 2012   diverticulosis.    ESOPHAGOGASTRODUODENOSCOPY N/A 02/23/2014   Procedure: ESOPHAGOGASTRODUODENOSCOPY (EGD);  Surgeon: Irene Shipper, MD;  Location: [facility];  Service: Endoscopy;  Laterality: N/A;   EYE SURGERY  2012   both cataracts, Lasik   LEFT KNEE ARTHROSCOPY Left 07/16/2017    [facility]    LUMBAR FUSION     x 3   REVERSE SHOULDER ARTHROPLASTY Left 07/04/2016   Procedure: LEFT REVERSE SHOULDER ARTHROPLASTY;  Surgeon: Netta Cedars, MD;  Location: [facility];  Service: Orthopedics;  Laterality: Left;   TONSILLECTOMY  1946   TOTAL KNEE ARTHROPLASTY  2003   Right   TOTAL KNEE ARTHROPLASTY Left 09/25/2017   Procedure: LEFT TOTAL KNEE ARTHROPLASTY;  Surgeon: Sydnee Cabal, MD;  Location: [facility];  Service: Orthopedics;  Laterality: Left;   Family History  Problem Relation Age of Onset   Aneurysm Father         AAA   Heart disease Father 38       CHF   Alzheimer's disease Father    Cancer Brother    Alzheimer's disease Brother        dementia   Stomach cancer Brother    Cancer Mother 23       breast cancer   Heart disease Mother 61       MI   Autoimmune disease Son    CAD Brother    Colon cancer Neg Hx    Esophageal cancer Neg Hx    Pancreatic cancer Neg Hx    Prostate cancer Neg Hx    Rectal cancer Neg Hx    Social History   Socioeconomic  History   Marital status: Married    Spouse name: Not on file   Number of children: Not on file   Years of education: Not on file   Highest education level: Not on file  Occupational History   Occupation: Retired    Fish farm manager: RETIRED    Comment: Dealer  Tobacco Use   Smoking status: Former    Types: Cigarettes    Quit date: 01/08/1987    Years since quitting: 35.2   Smokeless tobacco: Never  Vaping Use   Vaping Use: Never used  Substance and Sexual Activity   Alcohol use: Yes    Alcohol/week: 0.0 standard drinks of alcohol    Comment: rare   Drug use: No   Sexual activity: Not Currently  Other Topics Concern   Not on file  Social History Narrative   He lives with wife in a 2 story home.  Has 2 children.   Retired Dealer.   Highest level of education:  High school   Regular Exercise -  YES   Social Determinants of Health   Financial Resource Strain: Low Risk  (04/14/2022)   Overall Financial Resource Strain (CARDIA)    Difficulty of Paying Living Expenses: Not hard at all  Food Insecurity: No Food Insecurity (04/14/2022)   Hunger Vital Sign    Worried About Running Out of Food in the Last Year: Never true    Ran Out of Food in the Last Year: Never true  Transportation Needs: No Transportation Needs (04/14/2022)   PRAPARE - Hydrologist (Medical): No    Lack of Transportation (Non-Medical): No  Physical Activity: Inactive (04/14/2022)   Exercise Vital Sign    Days of Exercise per Week: 0 days     Minutes of Exercise per Session: 0 min  Stress: No Stress Concern Present (04/14/2022)   [place]    Feeling of Stress : Not at all  Social Connections: [employer] (04/14/2022)   Social Connection and Isolation Panel [NHANES]    Frequency of Communication with Friends and Family: More than three times a week    Frequency of Social Gatherings with Friends and Family: More than three times a week    Attends Religious Services: More than 4 times per year    Active Member of [employer] or Organizations: Yes    Attends Music therapist: More than 4 times per year    Marital Status: Married    Tobacco Counseling Counseling given: Not Answered   Clinical Intake:  Pre-visit preparation completed: Yes  Pain : No/denies pain     Nutritional Risks: None Diabetes: No  How often do you need to have someone help you when you read instructions, pamphlets, or other written materials from your doctor or pharmacy?: 1 - Never What is the last grade level you completed in school?: HSG  Diabetic? no  Interpreter Needed?: No  Information entered by :: Dearion Huot, LPN.   Activities of Daily Living    04/14/2022   10:21 AM 08/23/2021    3:00 PM  In your present state of health, do you have any difficulty performing the following activities:  Hearing? 1 1  Vision? 0 0  Difficulty concentrating or making decisions? 1 1  Walking or climbing stairs? 0 0  Dressing or bathing? 0 0  Doing errands, shopping? 1 0  Preparing Food and eating ? N   Using the Toilet? N  In the past six months, have you accidently leaked urine? N   Do you have problems with loss of bowel control? N   Managing your Medications? N   Managing your Finances? N   Housekeeping or managing your Housekeeping? Y     Patient Care Team: [name], [name], MD as PCP - General Calvert Cantor, MD (Ophthalmology) Drema Halon,  MD as Referring Physician (Hematology) Estill Batten, MD as Referring Physician (Neurology) Werner Lean Martinique Lee, MD as Referring Physician (Neurology) Szabat, Darnelle Maffucci, [facility] (Inactive) as Pharmacist (Pharmacist)  Indicate any recent Medical Services you may have received from other than [facility] providers in the past year (date may be approximate).     Assessment:   This is a routine wellness examination for [name].  Hearing/Vision screen Hearing Screening - Comments:: Patient has hearing difficulty and wears hearing aid in the left ear and has a cochlear implant in the right ear. Vision Screening - Comments:: Patient does wear readers.  Eye exam done by: [facility]   Dietary issues and exercise activities discussed: Current Exercise Habits: The patient does not participate in regular exercise at present, Exercise limited by: neurologic condition(s)   Goals Addressed             This Visit's Progress    NOT AT THIS TIME.        Depression Screen    04/14/2022   10:18 AM 03/19/2022    2:54 PM 05/16/2021    2:08 PM 04/04/2021    9:33 AM 01/17/2020   10:41 AM 06/03/2018    1:56 PM 04/15/2017    8:18 AM  PHQ 2/9 Scores  PHQ - 2 Score 0 0 0 0   0 0 0 0  PHQ- 9 Score 3 3  0       Fall Risk    04/14/2022   10:07 AM 04/04/2021    9:36 AM 04/04/2021    9:32 AM 01/17/2020   10:41 AM 11/29/2019    8:28 AM  Fall Risk   Falls in the past year? 0 0 0 0 0  Number falls in past yr: 0 0 0 0 0  Injury with Fall? 0 0 0 0 0  Risk for fall due to : No Fall Risks No Fall Risks No Fall Risks Impaired balance/gait   Follow up Falls evaluation completed Falls evaluation completed  Falls evaluation completed     FALL RISK PREVENTION PERTAINING TO THE HOME:  Any stairs in or around the home? Yes  If so, are there any without handrails? No  Home free of loose throw rugs in walkways, pet beds, electrical cords, etc? Yes  Adequate lighting in your home to reduce risk of falls? Yes    ASSISTIVE DEVICES UTILIZED TO PREVENT FALLS:  Life alert? No  Use of a cane, walker or w/c? No  Grab bars in the bathroom? Yes  Shower chair or bench in shower? Yes  Elevated toilet seat or a handicapped toilet? Yes   TIMED UP AND GO:  Was the test performed? No .  Length of time to ambulate 10 feet: n/a sec.   Appearance of gait: Gait not evaluated during this visit.  Cognitive Function:  Patient has current diagnosis of cognitive impairment. Patient is followed by neurology for ongoing assessment. Patient is unable to complete screening 6CIT or MMSE.      04/12/2018    2:24 PM 02/05/2018   11:03 AM  MMSE - Mini Mental State  Exam  Orientation to time 3 4  Orientation to Place 5 4  Registration 3 3  Attention/ Calculation 5 5  Recall 0 1  Language- name 2 objects 2 2  Language- repeat 1 0  Language- follow 3 step command 3 3  Language- read & follow direction 0 1  Write a sentence 1 1  Copy design 1 1  Total score 24 25        Immunizations Immunization History  Administered Date(s) Administered   H1N1 08/02/2008   Influenza Whole 06/10/2006, 06/15/2008, 05/22/2009, 05/25/2010, 05/26/2011, 04/25/2012   Influenza, High Dose Seasonal PF 04/15/2017, 05/09/2019, 04/22/2020   Influenza,inj,Quad PF,6+ Mos 05/23/2013, 04/13/2014, 05/23/2015, 04/16/2016   PFIZER(Purple Top)SARS-COV-2 Vaccination 09/02/2019, 09/23/2019, 04/29/2020   Pneumococcal Conjugate-13 07/10/2014   Pneumococcal Polysaccharide-23 06/25/2006, 12/30/2016, 04/09/2020   Td 07/04/2009   Tdap 06/04/2013   Zoster Recombinat (Shingrix) 12/28/2016, 05/09/2017   Zoster, Live 12/24/2006    TDAP status: Up to date  Flu Vaccine status: Up to date  Pneumococcal vaccine status: Up to date  Covid-19 vaccine status: Completed vaccines  Qualifies for Shingles Vaccine? Yes   Zostavax completed Yes   Shingrix Completed?: Yes  Screening Tests Health Maintenance  Topic Date Due   COVID-19 Vaccine (5 -  Pfizer risk series) 03/03/2022   INFLUENZA VACCINE  03/25/2022   TETANUS/TDAP  06/05/2023   Pneumonia Vaccine 30+ Years old  Completed   Zoster Vaccines- Shingrix  Completed   HPV VACCINES  Aged Out    Health Maintenance  Health Maintenance Due  Topic Date Due   COVID-19 Vaccine (5 - Pfizer risk series) 03/03/2022   INFLUENZA VACCINE  03/25/2022    Colorectal cancer screening: No longer required.   Lung Cancer Screening: (Low Dose CT Chest recommended if Age 19-80 years, 30 pack-year currently smoking OR have quit w/in 15years.) does not qualify.   Lung Cancer Screening Referral: no  Additional Screening:  Hepatitis C Screening: does not qualify; Completed no  Vision Screening: Recommended annual ophthalmology exams for early detection of glaucoma and other disorders of the eye. Is the patient up to date with their annual eye exam?  Yes  Who is the provider or what is the name of the office in which the patient attends annual eye exams? [facility] If pt is not established with a provider, would they like to be referred to a provider to establish care? No .   Dental Screening: Recommended annual dental exams for proper oral hygiene  Community Resource Referral / Chronic Care Management: CRR required this visit?  No   CCM required this visit?  No      Plan:     I have personally reviewed and noted the following in the patient's chart:   Medical and social history Use of alcohol, tobacco or illicit drugs  Current medications and supplements including opioid prescriptions. Patient is not currently taking opioid prescriptions. Functional ability and status Nutritional status Physical activity Advanced directives List of other physicians Hospitalizations, surgeries, and ER visits in previous 12 months Vitals Screenings to include cognitive, depression, and falls Referrals and appointments  In addition, I have reviewed and discussed with patient certain  preventive protocols, quality metrics, and best practice recommendations. A written personalized care plan for preventive services as well as general preventive health recommendations were provided to patient.     Sheral Flow, LPN   0/16/0109   Nurse Notes:  Patient has current diagnosis of cognitive impairment. Patient is followed by neurology  for ongoing assessment. Patient is unable to complete screening 6CIT or MMSE.  There were no vitals filed for this visit. There is no height or weight on file to calculate BMI. Patient's wife stated that he has no issues with gait or balance; does not use any assistive devices.  Medical screening examination/treatment/procedure(s) were performed by non-physician practitioner and as supervising physician I was immediately available for consultation/collaboration.  I agree with above. Lew Dawes, MD

## 2022-04-21 ENCOUNTER — Ambulatory Visit: Payer: Medicare Other

## 2022-05-01 DIAGNOSIS — H90A22 Sensorineural hearing loss, unilateral, left ear, with restricted hearing on the contralateral side: Secondary | ICD-10-CM | POA: Diagnosis not present

## 2022-05-01 DIAGNOSIS — Z9621 Cochlear implant status: Secondary | ICD-10-CM | POA: Diagnosis not present

## 2022-05-01 DIAGNOSIS — Z45321 Encounter for adjustment and management of cochlear device: Secondary | ICD-10-CM | POA: Diagnosis not present

## 2022-05-01 DIAGNOSIS — H90A31 Mixed conductive and sensorineural hearing loss, unilateral, right ear with restricted hearing on the contralateral side: Secondary | ICD-10-CM | POA: Diagnosis not present

## 2022-05-05 DIAGNOSIS — H903 Sensorineural hearing loss, bilateral: Secondary | ICD-10-CM | POA: Diagnosis not present

## 2022-05-08 ENCOUNTER — Encounter: Payer: Self-pay | Admitting: *Deleted

## 2022-05-08 ENCOUNTER — Ambulatory Visit: Payer: Medicare Other | Admitting: Adult Health

## 2022-05-12 ENCOUNTER — Encounter: Payer: Self-pay | Admitting: Adult Health

## 2022-05-12 ENCOUNTER — Ambulatory Visit: Payer: Medicare Other | Admitting: Adult Health

## 2022-05-12 VITALS — BP 106/66 | HR 61 | Ht 72.0 in | Wt 235.0 lb

## 2022-05-12 DIAGNOSIS — G4733 Obstructive sleep apnea (adult) (pediatric): Secondary | ICD-10-CM | POA: Diagnosis not present

## 2022-05-12 DIAGNOSIS — Z9989 Dependence on other enabling machines and devices: Secondary | ICD-10-CM

## 2022-05-12 NOTE — Progress Notes (Signed)
PATIENT: [name] DOB: 1940-12-03  REASON FOR VISIT: follow up HISTORY FROM: patient  Chief Complaint  Patient presents with   RM 19    Hasn't used CPAP in months. Has been dealing with a lot regarding hearing aids, cochlear implant, etc.  ESS 7, FSS 42.     HISTORY OF PRESENT ILLNESS: Today 05/12/22:  [name] is an 81 year old male with a history of obstructive sleep apnea on CPAP.  He returns today for follow-up with his wife.  He reports that he has had a rough couple months with getting a collicular implant.  For that reason he has not been using his CPAP.  Reports that he has gotten out of the habit.  Wife states he does have some memory changes and is on medication for that.  His last report is below.     02/13/21: [name] is a 81 year old male with a history of obstructive sleep apnea on CPAP.  His download is below.  He reports that the new mask is better but the mask still leaks and he has to adjust it at night.  His wife is inquiring if inspire would be a good option for him as he has struggled with his mask leaking.  He returns today for an evaluation.     08/13/20: [name] is a 81 year old male with a history of obstructive sleep apnea on CPAP.  His download indicates that he uses machine 23 out of 30 days for compliance of 77%.  He uses machine greater than 4 hours 16 days for compliance of 53%.  On average he uses his machine 4 hours and 56 minutes.  His residual AHI is 9.8 on 7 to 14 cm of water with EPR 3.  Leak in the 95th percentile is 51.1 L/min.  Maximum pressure is 11.8.  He is with his wife today.  She reports that she does hear the mask leaking during the night.  He returns today for follow-up.  08/11/19: [name] is a 81 year old male with a history of obstructive sleep apnea on CPAP.  His download indicates that he use his machine 24 out of 30 days for compliance of 80%.  He uses machine greater than 4 hours 14 days for  compliance of 47%.  On average he uses his machine 5 hours and 21 minutes.  His residual AHI is 6 on 7 to 14 cm of water with EPR 3.  His leak in the 95th percentile is 35.3 L/min.  He does need new supplies.  He reports that he does not like the tubing that comes out at the top of the head.  Patient reports that he still does not like using the CPAP but understands the benefit.  HISTORY 02/17/19:   [name] is a 81 year old male with a history of OSA on CPAP.  His CPAP download indicates that he uses machine 28 out of 30 days for compliance of 93%.  He uses machine greater than 4 hours 24 days for compliance of 80%.  On average he uses his machine 5 hours and 25 minutes.  His residual AHI is 5.6 on 7 to 13 cm of water with EPR of 3.  His leak in the 95th percentile is 20.9 L/min.  He reports that he has made a conscious effort to try to use the CPAP more often.  He is following with a [facility] neurologist for myasthenia gravis and memory disturbance.  He joins me today for virtual  visit.  REVIEW OF SYSTEMS: Out of a complete 14 system review of symptoms, the patient complains only of the following symptoms, and all other reviewed systems are negative. Epworth sleepiness score 7   ALLERGIES: Allergies  Allergen Reactions   Ferumoxytol Other (See Comments)    Stroke like symptoms; has hx of MG as well     HOME MEDICATIONS: Outpatient Medications Prior to Visit  Medication Sig Dispense Refill   ARIPiprazole (ABILIFY) 2 MG tablet Take 1 tablet (2 mg total) by mouth daily. 90 tablet 0   Cholecalciferol (VITAMIN D3) 2000 units TABS Take 2,000 Units by mouth at bedtime.     Coenzyme Q10 (CO Q-10 PO) Take 1 tablet by mouth at bedtime.     Cyanocobalamin (VITAMIN B-12) 1000 MCG SUBL Place 1 tablet (1,000 mcg total) under the tongue daily. (Patient taking differently: Place 1,000 mcg under the tongue at bedtime.) 100 tablet 3   donepezil (ARICEPT) 10 MG tablet Take 1 tablet by mouth at bedtime 30  tablet 11   famotidine (PEPCID) 40 MG tablet Take 1 tablet by mouth at bedtime 30 tablet 11   finasteride (PROSCAR) 5 MG tablet Take 1 tablet (5 mg total) by mouth daily. 90 tablet 3   furosemide (LASIX) 20 MG tablet Take 1 or 2 tablets by mouth every evening 60 tablet 11   LORazepam (ATIVAN) 1 MG tablet Take 1 tablet (1 mg total) by mouth 2 (two) times daily as needed for anxiety. (Patient taking differently: Take 1 mg by mouth daily as needed for anxiety.) 60 tablet 1   memantine (NAMENDA) 10 MG tablet TAKE 1 TABLET BY MOUTH TWICE A DAY 180 tablet 3   mycophenolate (CELLCEPT) 250 MG capsule Take 500 mg by mouth 2 (two) times daily.     potassium chloride (KLOR-CON) 10 MEQ tablet Take 1 tablet by mouth twice daily 60 tablet 11   pyridostigmine (MESTINON) 60 MG tablet One in AM , one at lunch, 2 at bedtime. (Patient taking differently: Take 60 mg by mouth 2 (two) times daily.) 450 tablet 3   sertraline (ZOLOFT) 100 MG tablet Take 1 tablet by mouth every morning 30 tablet 11   tamsulosin (FLOMAX) 0.4 MG CAPS capsule Take 2 capsules by mouth every day 60 capsule 11   vitamin C (ASCORBIC ACID) 500 MG tablet Take 500 mg by mouth daily.     guaiFENesin (MUCINEX) 600 MG 12 hr tablet Take 1 tablet (600 mg total) by mouth 2 (two) times daily as needed for cough. (Patient not taking: Reported on 05/12/2022)     No facility-administered medications prior to visit.    PAST MEDICAL HISTORY: Past Medical History:  Diagnosis Date   Anemia    Blood transfusion without reported diagnosis 02/23/2014   3 units for iron deficiency anemia. as a baby - whenhe had pneumonia   BPH (benign prostatic hyperplasia)    CTS (carpal tunnel syndrome)    better   Dementia (HCC)    Dyspnea    GERD (gastroesophageal reflux disease)    Hyperlipidemia    Hypertension    "THEY DIAGNOSED ME YEARS AGO BUT LATELY IVE ACTUALLY HAD LOW BLOOD PRESSURES "    Insomnia    LBP (low back pain)    Myasthenia gravis ([place])     Osteoarthritis    Sleep apnea    no cpap    PAST SURGICAL HISTORY: Past Surgical History:  Procedure Laterality Date   CARDIAC CATHETERIZATION N/A 08/15/2015   Procedure: Left  Heart Cath and Coronary Angiography;  Surgeon: Lorretta Harp, MD;  Location: [facility];  Service: Cardiovascular;  Laterality: N/A;   CARPAL TUNNEL RELEASE Bilateral 1996, 2004   Dr Lenoard Aden thumb surgery.  more than once   COCHLEAR IMPLANT  04/10/2022   COLONOSCOPY  2005, 2012   diverticulosis.    ESOPHAGOGASTRODUODENOSCOPY N/A 02/23/2014   Procedure: ESOPHAGOGASTRODUODENOSCOPY (EGD);  Surgeon: Irene Shipper, MD;  Location: [facility];  Service: Endoscopy;  Laterality: N/A;   EYE SURGERY  2012   both cataracts, Lasik   LEFT KNEE ARTHROSCOPY Left 07/16/2017    [facility]    LUMBAR FUSION     x 3   REVERSE SHOULDER ARTHROPLASTY Left 07/04/2016   Procedure: LEFT REVERSE SHOULDER ARTHROPLASTY;  Surgeon: Netta Cedars, MD;  Location: [facility];  Service: Orthopedics;  Laterality: Left;   TONSILLECTOMY  1946   TOTAL KNEE ARTHROPLASTY  2003   Right   TOTAL KNEE ARTHROPLASTY Left 09/25/2017   Procedure: LEFT TOTAL KNEE ARTHROPLASTY;  Surgeon: Sydnee Cabal, MD;  Location: [facility];  Service: Orthopedics;  Laterality: Left;    FAMILY HISTORY: Family History  Problem Relation Age of Onset   Aneurysm Father        AAA   Heart disease Father 7       CHF   Alzheimer's disease Father    Cancer Brother    Alzheimer's disease Brother        dementia   Stomach cancer Brother    Cancer Mother 94       breast cancer   Heart disease Mother 14       MI   Autoimmune disease Son    CAD Brother    Colon cancer Neg Hx    Esophageal cancer Neg Hx    Pancreatic cancer Neg Hx    Prostate cancer Neg Hx    Rectal cancer Neg Hx     SOCIAL HISTORY: Social History   Socioeconomic History   Marital status: Married    Spouse name: Not on file   Number of children: Not on file   Years of education: Not on  file   Highest education level: Not on file  Occupational History   Occupation: Retired    Fish farm manager: RETIRED    Comment: Dealer  Tobacco Use   Smoking status: Former    Types: Cigarettes    Quit date: 01/08/1987    Years since quitting: 35.3   Smokeless tobacco: Never  Vaping Use   Vaping Use: Never used  Substance and Sexual Activity   Alcohol use: Yes    Alcohol/week: 0.0 standard drinks of alcohol    Comment: rare   Drug use: No   Sexual activity: Not Currently  Other Topics Concern   Not on file  Social History Narrative   He lives with wife in a 2 story home (bedroom is downstairs).  Has 2 children.   Retired Dealer.   Highest level of education:  High school   Regular Exercise -  YES   Social Determinants of Health   Financial Resource Strain: Low Risk  (04/14/2022)   Overall Financial Resource Strain (CARDIA)    Difficulty of Paying Living Expenses: Not hard at all  Food Insecurity: No Food Insecurity (04/14/2022)   Hunger Vital Sign    Worried About Running Out of Food in the Last Year: Never true    Ran Out of Food in the Last Year: Never true  Transportation Needs: No  Transportation Needs (04/14/2022)   PRAPARE - Hydrologist (Medical): No    Lack of Transportation (Non-Medical): No  Physical Activity: Inactive (04/14/2022)   Exercise Vital Sign    Days of Exercise per Week: 0 days    Minutes of Exercise per Session: 0 min  Stress: No Stress Concern Present (04/14/2022)   [place]    Feeling of Stress : Not at all  Social Connections: Brethren (04/14/2022)   Social Connection and Isolation Panel [NHANES]    Frequency of Communication with Friends and Family: More than three times a week    Frequency of Social Gatherings with Friends and Family: More than three times a week    Attends Religious Services: More than 4 times per year    Active Member of  [employer] or Organizations: Yes    Attends Music therapist: More than 4 times per year    Marital Status: Married  Human resources officer Violence: Not At Risk (04/14/2022)   Humiliation, Afraid, Rape, and Kick questionnaire    Fear of Current or Ex-Partner: No    Emotionally Abused: No    Physically Abused: No    Sexually Abused: No      PHYSICAL EXAM  Vitals:   05/12/22 1441  BP: 106/66  Pulse: 61  Weight: 235 lb (106.6 kg)  Height: 6' (1.829 m)   Body mass index is 31.87 kg/m.  Generalized: Well developed, in no acute distress  Chest: Lungs clear to auscultation bilaterally  Neurological examination  Mentation: Alert oriented to time, place, history taking. Follows all commands speech and language fluent Cranial nerve II-XII: Extraocular movements were full, visual field were full on confrontational test Head turning and shoulder shrug  were normal and symmetric. Motor: The motor testing reveals 5 over 5 strength of all 4 extremities. Good symmetric motor tone is noted throughout.  Sensory: Sensory testing is intact to soft touch on all 4 extremities. No evidence of extinction is noted.  Gait and station: Gait is normal.    DIAGNOSTIC DATA (LABS, IMAGING, TESTING) - I reviewed patient records, labs, notes, testing and imaging myself where available.  Lab Results  Component Value Date   WBC 4.5 01/30/2022   HGB 14.8 01/30/2022   HCT 44.9 01/30/2022   MCV 91.3 01/30/2022   PLT 173.0 01/30/2022      Component Value Date/Time   NA 140 01/30/2022 1158   NA 144 02/05/2018 1205   NA 140 05/16/2014 1327   K 3.8 01/30/2022 1158   K 3.8 05/16/2014 1327   CL 102 01/30/2022 1158   CO2 28 01/30/2022 1158   CO2 28 05/16/2014 1327   GLUCOSE 95 01/30/2022 1158   GLUCOSE 98 05/16/2014 1327   BUN 13 01/30/2022 1158   BUN 11 02/05/2018 1205   BUN 17.6 05/16/2014 1327   CREATININE 0.71 01/30/2022 1158   CREATININE 0.82 08/13/2015 0953   CREATININE 0.9 05/16/2014 1327    CALCIUM 10.0 01/30/2022 1158   CALCIUM 9.4 05/16/2014 1327   PROT 6.8 01/30/2022 1158   PROT 6.0 02/05/2018 1205   PROT 6.9 05/16/2014 1327   ALBUMIN 4.3 01/30/2022 1158   ALBUMIN 4.4 02/05/2018 1205   ALBUMIN 3.7 05/16/2014 1327   AST 15 01/30/2022 1158   AST 19 05/16/2014 1327   ALT 10 01/30/2022 1158   ALT 9 05/16/2014 1327   ALKPHOS 67 01/30/2022 1158   ALKPHOS 66 05/16/2014 1327  BILITOT 1.0 01/30/2022 1158   BILITOT 0.4 02/05/2018 1205   BILITOT 0.50 05/16/2014 1327   GFRNONAA >60 08/25/2021 0339   GFRNONAA 87 08/13/2015 0953   GFRAA >60 04/23/2020 1135   GFRAA >89 08/13/2015 0953   Lab Results  Component Value Date   CHOL 246 (H) 03/28/2021   HDL 62.80 03/28/2021   LDLCALC 162 (H) 03/28/2021   LDLDIRECT 157.5 07/05/2008   TRIG 107.0 03/28/2021   CHOLHDL 4 03/28/2021   Lab Results  Component Value Date   HGBA1C 5.5 02/05/2018   Lab Results  Component Value Date   VITAMINB12 1,077 (H) 01/30/2022   Lab Results  Component Value Date   TSH 1.04 01/30/2022      ASSESSMENT AND PLAN 81 y.o. year old male  has a past medical history of Anemia, Blood transfusion without reported diagnosis (02/23/2014), BPH (benign prostatic hyperplasia), CTS (carpal tunnel syndrome), Dementia ([place]), Dyspnea, GERD (gastroesophageal reflux disease), Hyperlipidemia, Hypertension, Insomnia, LBP (low back pain), Myasthenia gravis ([place]), Osteoarthritis, and Sleep apnea. here with:  Obstructive sleep apnea on CPAP  -- Encouraged him to restart CPAP --Encouraged patient to use CPAP nightly and greater than 4 hours each night -- Review the risk of untreated sleep apnea --Follow-up in 6 months or sooner if needed     Ward Givens, MSN, NP-C 05/12/2022, 2:48 PM [facility] [address] [place], [place] [address] [phone]

## 2022-05-15 DIAGNOSIS — H903 Sensorineural hearing loss, bilateral: Secondary | ICD-10-CM | POA: Diagnosis not present

## 2022-05-19 DIAGNOSIS — H903 Sensorineural hearing loss, bilateral: Secondary | ICD-10-CM | POA: Diagnosis not present

## 2022-05-26 DIAGNOSIS — H903 Sensorineural hearing loss, bilateral: Secondary | ICD-10-CM | POA: Diagnosis not present

## 2022-05-30 DIAGNOSIS — Z45321 Encounter for adjustment and management of cochlear device: Secondary | ICD-10-CM | POA: Diagnosis not present

## 2022-05-30 DIAGNOSIS — Z9621 Cochlear implant status: Secondary | ICD-10-CM | POA: Diagnosis not present

## 2022-05-30 DIAGNOSIS — H903 Sensorineural hearing loss, bilateral: Secondary | ICD-10-CM | POA: Diagnosis not present

## 2022-06-05 DIAGNOSIS — H903 Sensorineural hearing loss, bilateral: Secondary | ICD-10-CM | POA: Diagnosis not present

## 2022-06-06 ENCOUNTER — Other Ambulatory Visit (INDEPENDENT_AMBULATORY_CARE_PROVIDER_SITE_OTHER): Payer: Medicare Other

## 2022-06-06 DIAGNOSIS — D509 Iron deficiency anemia, unspecified: Secondary | ICD-10-CM | POA: Diagnosis not present

## 2022-06-06 DIAGNOSIS — I10 Essential (primary) hypertension: Secondary | ICD-10-CM | POA: Diagnosis not present

## 2022-06-06 LAB — CBC WITH DIFFERENTIAL/PLATELET
Basophils Absolute: 0 10*3/uL (ref 0.0–0.1)
Basophils Relative: 0.7 % (ref 0.0–3.0)
Eosinophils Absolute: 0.1 10*3/uL (ref 0.0–0.7)
Eosinophils Relative: 1.4 % (ref 0.0–5.0)
HCT: 42.4 % (ref 39.0–52.0)
Hemoglobin: 14.1 g/dL (ref 13.0–17.0)
Lymphocytes Relative: 20.5 % (ref 12.0–46.0)
Lymphs Abs: 1.1 10*3/uL (ref 0.7–4.0)
MCHC: 33.2 g/dL (ref 30.0–36.0)
MCV: 92.5 fl (ref 78.0–100.0)
Monocytes Absolute: 0.7 10*3/uL (ref 0.1–1.0)
Monocytes Relative: 14.1 % — ABNORMAL HIGH (ref 3.0–12.0)
Neutro Abs: 3.2 10*3/uL (ref 1.4–7.7)
Neutrophils Relative %: 63.3 % (ref 43.0–77.0)
Platelets: 148 10*3/uL — ABNORMAL LOW (ref 150.0–400.0)
RBC: 4.59 Mil/uL (ref 4.22–5.81)
RDW: 14.1 % (ref 11.5–15.5)
WBC: 5.1 10*3/uL (ref 4.0–10.5)

## 2022-06-06 LAB — BASIC METABOLIC PANEL
BUN: 17 mg/dL (ref 6–23)
CO2: 27 mEq/L (ref 19–32)
Calcium: 9.7 mg/dL (ref 8.4–10.5)
Chloride: 102 mEq/L (ref 96–112)
Creatinine, Ser: 0.78 mg/dL (ref 0.40–1.50)
GFR: 83.8 mL/min (ref >60.00)
Glucose, Bld: 117 mg/dL — ABNORMAL HIGH (ref 70–99)
Potassium: 4.1 mEq/L (ref 3.5–5.1)
Sodium: 137 mEq/L (ref 135–145)

## 2022-06-06 LAB — IBC PANEL
Iron: 108 ug/dL (ref 42–165)
Saturation Ratios: 29.2 % (ref 20.0–50.0)
TIBC: 369.6 ug/dL (ref 250.0–450.0)
Transferrin: 264 mg/dL (ref 212.0–360.0)

## 2022-06-09 ENCOUNTER — Encounter: Payer: Self-pay | Admitting: Internal Medicine

## 2022-06-09 ENCOUNTER — Ambulatory Visit (INDEPENDENT_AMBULATORY_CARE_PROVIDER_SITE_OTHER): Payer: Medicare Other | Admitting: Internal Medicine

## 2022-06-09 DIAGNOSIS — E538 Deficiency of other specified B group vitamins: Secondary | ICD-10-CM | POA: Diagnosis not present

## 2022-06-09 DIAGNOSIS — F02818 Dementia in other diseases classified elsewhere, unspecified severity, with other behavioral disturbance: Secondary | ICD-10-CM | POA: Diagnosis not present

## 2022-06-09 DIAGNOSIS — H903 Sensorineural hearing loss, bilateral: Secondary | ICD-10-CM

## 2022-06-09 DIAGNOSIS — G309 Alzheimer's disease, unspecified: Secondary | ICD-10-CM

## 2022-06-09 DIAGNOSIS — G7000 Myasthenia gravis without (acute) exacerbation: Secondary | ICD-10-CM | POA: Diagnosis not present

## 2022-06-09 DIAGNOSIS — F0280 Dementia in other diseases classified elsewhere without behavioral disturbance: Secondary | ICD-10-CM

## 2022-06-09 MED ORDER — LORAZEPAM 1 MG PO TABS: 1.0000 mg | ORAL_TABLET | Freq: Two times a day (BID) | ORAL | 1 refills | Status: DC | PRN

## 2022-06-09 NOTE — Assessment & Plan Note (Signed)
S/p R cochlear implant in [employer] - 04/10/2022 Seeing [facility]

## 2022-06-09 NOTE — Assessment & Plan Note (Signed)
S/p R cochlear implant in [place] - 04/10/2022 - doing better since he can hear

## 2022-06-09 NOTE — Assessment & Plan Note (Signed)
On B12 

## 2022-06-09 NOTE — Assessment & Plan Note (Addendum)
S/p R cochlear implant in [place] - 04/10/2022 - doing better since he can hear

## 2022-06-09 NOTE — Assessment & Plan Note (Signed)
Cont on Cellcept, Mestinon

## 2022-06-09 NOTE — Progress Notes (Signed)
Subjective:  Patient ID: [name], male    DOB: [birth year]  Age: 81 y.o. MRN: [id number]  CC: Follow-up (4 month f/u)   HPI [name] presents for hearing loss -  S/p R cochlear implant in [place] - 04/10/2022 F/u on MG, HTN   Outpatient Medications Prior to Visit  Medication Sig Dispense Refill   ARIPiprazole (ABILIFY) 2 MG tablet Take 1 tablet (2 mg total) by mouth daily. 90 tablet 0   Cholecalciferol (VITAMIN D3) 2000 units TABS Take 2,000 Units by mouth at bedtime.     Coenzyme Q10 (CO Q-10 PO) Take 1 tablet by mouth at bedtime.     Cyanocobalamin (VITAMIN B-12) 1000 MCG SUBL Place 1 tablet (1,000 mcg total) under the tongue daily. (Patient taking differently: Place 1,000 mcg under the tongue at bedtime.) 100 tablet 3   donepezil (ARICEPT) 10 MG tablet Take 1 tablet by mouth at bedtime 30 tablet 11   famotidine (PEPCID) 40 MG tablet Take 1 tablet by mouth at bedtime 30 tablet 11   finasteride (PROSCAR) 5 MG tablet Take 1 tablet (5 mg total) by mouth daily. 90 tablet 3   furosemide (LASIX) 20 MG tablet Take 1 or 2 tablets by mouth every evening 60 tablet 11   memantine (NAMENDA) 10 MG tablet TAKE 1 TABLET BY MOUTH TWICE A DAY 180 tablet 3   mycophenolate (CELLCEPT) 250 MG capsule Take 500 mg by mouth 2 (two) times daily.     potassium chloride (KLOR-CON) 10 MEQ tablet Take 1 tablet by mouth twice daily 60 tablet 11   pyridostigmine (MESTINON) 60 MG tablet One in AM , one at lunch, 2 at bedtime. (Patient taking differently: Take 60 mg by mouth 2 (two) times daily.) 450 tablet 3   sertraline (ZOLOFT) 100 MG tablet Take 1 tablet by mouth every morning 30 tablet 11   tamsulosin (FLOMAX) 0.4 MG CAPS capsule Take 2 capsules by mouth every day 60 capsule 11   vitamin C (ASCORBIC ACID) 500 MG tablet Take 500 mg by mouth daily.     LORazepam (ATIVAN) 1 MG tablet Take 1 tablet (1 mg total) by mouth 2 (two) times daily as needed for anxiety. (Patient taking differently: Take  1 mg by mouth daily as needed for anxiety.) 60 tablet 1   No facility-administered medications prior to visit.    ROS: Review of Systems  Constitutional:  Negative for appetite change, fatigue and unexpected weight change.  HENT:  Negative for congestion, nosebleeds, sneezing, sore throat and trouble swallowing.   Eyes:  Negative for itching and visual disturbance.  Respiratory:  Negative for cough.   Cardiovascular:  Negative for chest pain, palpitations and leg swelling.  Gastrointestinal:  Negative for abdominal distention, blood in stool, diarrhea and nausea.  Genitourinary:  Negative for frequency and hematuria.  Musculoskeletal:  Positive for arthralgias. Negative for back pain, gait problem, joint swelling and neck pain.  Skin:  Negative for rash.  Neurological:  Negative for dizziness, tremors, speech difficulty and weakness.  Psychiatric/Behavioral:  Positive for decreased concentration. Negative for agitation, dysphoric mood and sleep disturbance. The patient is not nervous/anxious.     Objective:  BP 120/68 (BP Location: Left Arm)   Pulse 60   Temp 97.8 F (36.6 C) (Oral)   Ht 6' (1.829 m)   Wt 236 lb 3.2 oz (107.1 kg)   SpO2 94%   BMI 32.03 kg/m   BP Readings from Last 3 Encounters:  06/09/22 120/68  05/12/22  106/66  04/01/22 120/66    Wt Readings from Last 3 Encounters:  06/09/22 236 lb 3.2 oz (107.1 kg)  05/12/22 235 lb (106.6 kg)  04/01/22 228 lb (103.4 kg)    Physical Exam Constitutional:      General: He is not in acute distress.    Appearance: He is well-developed. He is obese.     Comments: NAD  Eyes:     Conjunctiva/sclera: Conjunctivae normal.     Pupils: Pupils are equal, round, and reactive to light.  Neck:     Thyroid: No thyromegaly.     Vascular: No JVD.  Cardiovascular:     Rate and Rhythm: Normal rate and regular rhythm.     Heart sounds: Normal heart sounds. No murmur heard.    No friction rub. No gallop.  Pulmonary:     Effort:  Pulmonary effort is normal. No respiratory distress.     Breath sounds: Normal breath sounds. No wheezing or rales.  Chest:     Chest wall: No tenderness.  Abdominal:     General: Bowel sounds are normal. There is no distension.     Palpations: Abdomen is soft. There is no mass.     Tenderness: There is no abdominal tenderness. There is no guarding or rebound.  Musculoskeletal:        General: No tenderness. Normal range of motion.     Cervical back: Normal range of motion.  Lymphadenopathy:     Cervical: No cervical adenopathy.  Skin:    General: Skin is warm and dry.     Findings: No rash.  Neurological:     Mental Status: He is alert and oriented to person, place, and time.     Cranial Nerves: No cranial nerve deficit.     Motor: No abnormal muscle tone.     Coordination: Coordination normal.     Gait: Gait normal.     Deep Tendon Reflexes: Reflexes are normal and symmetric.  Psychiatric:        Behavior: Behavior normal.        Thought Content: Thought content normal.        Judgment: Judgment normal.    R cochlear implant in place   Lab Results  Component Value Date   WBC 5.1 06/06/2022   HGB 14.1 06/06/2022   HCT 42.4 06/06/2022   PLT 148.0 (L) 06/06/2022   GLUCOSE 117 (H) 06/06/2022   CHOL 246 (H) 03/28/2021   TRIG 107.0 03/28/2021   HDL 62.80 03/28/2021   LDLDIRECT 157.5 07/05/2008   LDLCALC 162 (H) 03/28/2021   ALT 10 01/30/2022   AST 15 01/30/2022   NA 137 06/06/2022   K 4.1 06/06/2022   CL 102 06/06/2022   CREATININE 0.78 06/06/2022   BUN 17 06/06/2022   CO2 27 06/06/2022   TSH 1.04 01/30/2022   PSA 0.21 03/28/2021   INR 1.1 08/23/2021   HGBA1C 5.5 02/05/2018    DG Chest Port 1 View  Result Date: 08/23/2021 CLINICAL DATA:  Concern for sepsis.  Cough and congestion. EXAM: PORTABLE CHEST 1 VIEW COMPARISON:  02/28/2021 FINDINGS: Chronic asymmetric elevation of left hemidiaphragm, which obscures the left heart border. Opacity overlying the elevated  left hemidiaphragm may represent pneumonia and/or atelectasis. Right lung appears clear. No signs of pleural effusion or interstitial edema. IMPRESSION: 1. Chronic asymmetric elevation of left hemidiaphragm. 2. Opacity overlying the left hemidiaphragm may represent pneumonia and/or atelectasis. Electronically Signed   By: Kerby Moors M.D.   On: 08/23/2021  08:32    Assessment & Plan:   Problem List Items Addressed This Visit     Alzheimer disease [facility])    S/p R cochlear implant in [place] - 04/10/2022 - doing better since he can hear      Relevant Medications   LORazepam (ATIVAN) 1 MG tablet   Alzheimer's disease with behavioral disturbance ([place])    S/p R cochlear implant in [place] - 04/10/2022 - doing better since he can hear       Relevant Medications   LORazepam (ATIVAN) 1 MG tablet   B12 deficiency    On B12      Relevant Orders   CBC with Differential/Platelet   Hearing loss     S/p R cochlear implant in [place] - 04/10/2022 Seeing [facility]      Myasthenia gravis, AChR antibody positive (HCC)    Cont on Cellcept, Mestinon      Relevant Medications   LORazepam (ATIVAN) 1 MG tablet   Other Relevant Orders   Comprehensive metabolic panel   CBC with Differential/Platelet      Meds ordered this encounter  Medications   LORazepam (ATIVAN) 1 MG tablet    Sig: Take 1 tablet (1 mg total) by mouth 2 (two) times daily as needed for anxiety.    Dispense:  60 tablet    Refill:  1      Follow-up: Return in about 3 months (around 09/09/2022) for a follow-up visit.  [name], MD

## 2022-06-11 DIAGNOSIS — H903 Sensorineural hearing loss, bilateral: Secondary | ICD-10-CM | POA: Diagnosis not present

## 2022-06-14 ENCOUNTER — Other Ambulatory Visit: Payer: Self-pay | Admitting: Internal Medicine

## 2022-06-14 DIAGNOSIS — F3341 Major depressive disorder, recurrent, in partial remission: Secondary | ICD-10-CM

## 2022-06-14 DIAGNOSIS — F02818 Dementia in other diseases classified elsewhere, unspecified severity, with other behavioral disturbance: Secondary | ICD-10-CM

## 2022-06-17 ENCOUNTER — Other Ambulatory Visit: Payer: Self-pay | Admitting: Internal Medicine

## 2022-06-19 DIAGNOSIS — H903 Sensorineural hearing loss, bilateral: Secondary | ICD-10-CM | POA: Diagnosis not present

## 2022-06-24 DIAGNOSIS — H903 Sensorineural hearing loss, bilateral: Secondary | ICD-10-CM | POA: Diagnosis not present

## 2022-06-28 ENCOUNTER — Other Ambulatory Visit: Payer: Self-pay | Admitting: Internal Medicine

## 2022-06-30 DIAGNOSIS — H903 Sensorineural hearing loss, bilateral: Secondary | ICD-10-CM | POA: Diagnosis not present

## 2022-07-07 DIAGNOSIS — H903 Sensorineural hearing loss, bilateral: Secondary | ICD-10-CM | POA: Diagnosis not present

## 2022-07-14 DIAGNOSIS — H903 Sensorineural hearing loss, bilateral: Secondary | ICD-10-CM | POA: Diagnosis not present

## 2022-07-14 DIAGNOSIS — Z961 Presence of intraocular lens: Secondary | ICD-10-CM | POA: Diagnosis not present

## 2022-07-14 DIAGNOSIS — H04123 Dry eye syndrome of bilateral lacrimal glands: Secondary | ICD-10-CM | POA: Diagnosis not present

## 2022-07-14 DIAGNOSIS — G7000 Myasthenia gravis without (acute) exacerbation: Secondary | ICD-10-CM | POA: Diagnosis not present

## 2022-07-14 DIAGNOSIS — H26492 Other secondary cataract, left eye: Secondary | ICD-10-CM | POA: Diagnosis not present

## 2022-07-14 DIAGNOSIS — H43813 Vitreous degeneration, bilateral: Secondary | ICD-10-CM | POA: Diagnosis not present

## 2022-07-22 ENCOUNTER — Other Ambulatory Visit: Payer: Self-pay | Admitting: Internal Medicine

## 2022-07-23 DIAGNOSIS — H903 Sensorineural hearing loss, bilateral: Secondary | ICD-10-CM | POA: Diagnosis not present

## 2022-07-31 DIAGNOSIS — H903 Sensorineural hearing loss, bilateral: Secondary | ICD-10-CM | POA: Diagnosis not present

## 2022-08-01 DIAGNOSIS — H903 Sensorineural hearing loss, bilateral: Secondary | ICD-10-CM | POA: Diagnosis not present

## 2022-08-06 DIAGNOSIS — H903 Sensorineural hearing loss, bilateral: Secondary | ICD-10-CM | POA: Diagnosis not present

## 2022-08-08 ENCOUNTER — Other Ambulatory Visit: Payer: Self-pay | Admitting: Internal Medicine

## 2022-08-13 DIAGNOSIS — H903 Sensorineural hearing loss, bilateral: Secondary | ICD-10-CM | POA: Diagnosis not present

## 2022-08-18 ENCOUNTER — Other Ambulatory Visit: Payer: Self-pay | Admitting: Internal Medicine

## 2022-08-22 ENCOUNTER — Other Ambulatory Visit: Payer: Self-pay | Admitting: Internal Medicine

## 2022-08-22 DIAGNOSIS — H903 Sensorineural hearing loss, bilateral: Secondary | ICD-10-CM | POA: Diagnosis not present

## 2022-08-24 ENCOUNTER — Other Ambulatory Visit: Payer: Self-pay | Admitting: Internal Medicine

## 2022-08-24 DIAGNOSIS — F3341 Major depressive disorder, recurrent, in partial remission: Secondary | ICD-10-CM

## 2022-08-24 DIAGNOSIS — F02818 Dementia in other diseases classified elsewhere, unspecified severity, with other behavioral disturbance: Secondary | ICD-10-CM

## 2022-08-29 DIAGNOSIS — H903 Sensorineural hearing loss, bilateral: Secondary | ICD-10-CM | POA: Diagnosis not present

## 2022-09-16 ENCOUNTER — Other Ambulatory Visit (INDEPENDENT_AMBULATORY_CARE_PROVIDER_SITE_OTHER): Payer: Medicare Other

## 2022-09-16 ENCOUNTER — Encounter: Payer: Self-pay | Admitting: Internal Medicine

## 2022-09-16 ENCOUNTER — Ambulatory Visit (INDEPENDENT_AMBULATORY_CARE_PROVIDER_SITE_OTHER): Payer: Medicare Other | Admitting: Internal Medicine

## 2022-09-16 VITALS — BP 120/68 | HR 65 | Temp 97.4°F | Ht 72.0 in | Wt 245.0 lb

## 2022-09-16 DIAGNOSIS — G309 Alzheimer's disease, unspecified: Secondary | ICD-10-CM

## 2022-09-16 DIAGNOSIS — F02818 Dementia in other diseases classified elsewhere, unspecified severity, with other behavioral disturbance: Secondary | ICD-10-CM | POA: Diagnosis not present

## 2022-09-16 DIAGNOSIS — G7000 Myasthenia gravis without (acute) exacerbation: Secondary | ICD-10-CM | POA: Diagnosis not present

## 2022-09-16 DIAGNOSIS — E538 Deficiency of other specified B group vitamins: Secondary | ICD-10-CM

## 2022-09-16 DIAGNOSIS — I10 Essential (primary) hypertension: Secondary | ICD-10-CM

## 2022-09-16 DIAGNOSIS — I2510 Atherosclerotic heart disease of native coronary artery without angina pectoris: Secondary | ICD-10-CM | POA: Diagnosis not present

## 2022-09-16 DIAGNOSIS — F0280 Dementia in other diseases classified elsewhere without behavioral disturbance: Secondary | ICD-10-CM

## 2022-09-16 DIAGNOSIS — F3341 Major depressive disorder, recurrent, in partial remission: Secondary | ICD-10-CM

## 2022-09-16 LAB — COMPREHENSIVE METABOLIC PANEL
ALT: 13 U/L (ref 0–53)
AST: 16 U/L (ref 0–37)
Albumin: 4.1 g/dL (ref 3.5–5.2)
Alkaline Phosphatase: 65 U/L (ref 39–117)
BUN: 21 mg/dL (ref 6–23)
CO2: 29 mEq/L (ref 19–32)
Calcium: 9.6 mg/dL (ref 8.4–10.5)
Chloride: 104 mEq/L (ref 96–112)
Creatinine, Ser: 0.7 mg/dL (ref 0.40–1.50)
GFR: 86.41 mL/min (ref >60.00)
Glucose, Bld: 85 mg/dL (ref 70–99)
Potassium: 4.3 mEq/L (ref 3.5–5.1)
Sodium: 140 mEq/L (ref 135–145)
Total Bilirubin: 0.9 mg/dL (ref 0.2–1.2)
Total Protein: 6.5 g/dL (ref 6.0–8.3)

## 2022-09-16 LAB — CBC WITH DIFFERENTIAL/PLATELET
Basophils Absolute: 0 K/uL (ref 0.0–0.1)
Basophils Relative: 0.6 % (ref 0.0–3.0)
Eosinophils Absolute: 0.1 K/uL (ref 0.0–0.7)
Eosinophils Relative: 2.7 % (ref 0.0–5.0)
HCT: 43.1 % (ref 39.0–52.0)
Hemoglobin: 14.1 g/dL (ref 13.0–17.0)
Lymphocytes Relative: 21.9 % (ref 12.0–46.0)
Lymphs Abs: 1 K/uL (ref 0.7–4.0)
MCHC: 32.8 g/dL (ref 30.0–36.0)
MCV: 92.8 fl (ref 78.0–100.0)
Monocytes Absolute: 0.7 K/uL (ref 0.1–1.0)
Monocytes Relative: 16.5 % — ABNORMAL HIGH (ref 3.0–12.0)
Neutro Abs: 2.6 K/uL (ref 1.4–7.7)
Neutrophils Relative %: 58.3 % (ref 43.0–77.0)
Platelets: 163 K/uL (ref 150.0–400.0)
RBC: 4.64 Mil/uL (ref 4.22–5.81)
RDW: 13.6 % (ref 11.5–15.5)
WBC: 4.4 K/uL (ref 4.0–10.5)

## 2022-09-16 MED ORDER — ARIPIPRAZOLE 2 MG PO TABS: 2.0000 mg | ORAL_TABLET | Freq: Every day | ORAL | 3 refills | Status: DC

## 2022-09-16 NOTE — Assessment & Plan Note (Addendum)
No CP Cont w/Simvastatin

## 2022-09-16 NOTE — Assessment & Plan Note (Signed)
On Paxil 

## 2022-09-16 NOTE — Assessment & Plan Note (Signed)
Paxil for Zoloft.

## 2022-09-16 NOTE — Progress Notes (Signed)
Subjective:  Patient ID: [name], male    DOB: 09-12-1940  Age: 82 y.o. MRN: [id number]  CC: No chief complaint on file.   HPIJerry G Omalley presents for HTN, CAD, Alzheimer's  Outpatient Medications Prior to Visit  Medication Sig Dispense Refill   Cholecalciferol (VITAMIN D3) 2000 units TABS Take 2,000 Units by mouth at bedtime.     Coenzyme Q10 (CO Q-10 PO) Take 1 tablet by mouth at bedtime.     Cyanocobalamin (VITAMIN B-12) 1000 MCG SUBL Place 1 tablet (1,000 mcg total) under the tongue daily. (Patient taking differently: Place 1,000 mcg under the tongue at bedtime.) 100 tablet 3   donepezil (ARICEPT) 10 MG tablet Take 1 tablet by mouth at bedtime 30 tablet 5   famotidine (PEPCID) 40 MG tablet Take 1 tablet by mouth at bedtime 30 tablet 11   finasteride (PROSCAR) 5 MG tablet Take 1 tablet by mouth every day 30 tablet 11   furosemide (LASIX) 20 MG tablet Take 1 or 2 tablets by mouth every evening 60 tablet 11   LORazepam (ATIVAN) 1 MG tablet Take 1 tablet (1 mg total) by mouth 2 (two) times daily as needed for anxiety. 60 tablet 1   memantine (NAMENDA) 10 MG tablet Take 1 tablet by mouth twice daily 60 tablet 11   mycophenolate (CELLCEPT) 250 MG capsule Take 500 mg by mouth 2 (two) times daily.     potassium chloride (KLOR-CON) 10 MEQ tablet Take 1 tablet by mouth twice daily 60 tablet 11   pyridostigmine (MESTINON) 60 MG tablet One in AM , one at lunch, 2 at bedtime. (Patient taking differently: Take 60 mg by mouth 2 (two) times daily.) 450 tablet 3   sertraline (ZOLOFT) 100 MG tablet Take 1 tablet by mouth every morning 30 tablet 11   tamsulosin (FLOMAX) 0.4 MG CAPS capsule Take 2 capsules by mouth every day 60 capsule 11   vitamin C (ASCORBIC ACID) 500 MG tablet Take 500 mg by mouth daily.     ARIPiprazole (ABILIFY) 2 MG tablet TAKE 1 TABLET BY MOUTH EVERY DAY 90 tablet 0   No facility-administered medications prior to visit.    ROS: Review of Systems   Constitutional:  Negative for appetite change, fatigue and unexpected weight change.  HENT:  Negative for congestion, nosebleeds, sneezing, sore throat and trouble swallowing.   Eyes:  Negative for itching and visual disturbance.  Respiratory:  Negative for cough.   Cardiovascular:  Negative for chest pain, palpitations and leg swelling.  Gastrointestinal:  Negative for abdominal distention, blood in stool, diarrhea and nausea.  Genitourinary:  Negative for frequency and hematuria.  Musculoskeletal:  Negative for back pain, gait problem, joint swelling and neck pain.  Skin:  Negative for rash.  Neurological:  Negative for dizziness, tremors, speech difficulty and weakness.  Psychiatric/Behavioral:  Positive for confusion and decreased concentration. Negative for agitation, behavioral problems, dysphoric mood, sleep disturbance and suicidal ideas. The patient is not nervous/anxious.     Objective:  BP 120/68 (BP Location: Left Arm, Patient Position: Sitting, Cuff Size: Normal)   Pulse 65   Temp (!) 97.4 F (36.3 C) (Oral)   Ht 6' (1.829 m)   Wt 245 lb (111.1 kg)   SpO2 93%   BMI 33.23 kg/m   BP Readings from Last 3 Encounters:  09/16/22 120/68  06/09/22 120/68  05/12/22 106/66    Wt Readings from Last 3 Encounters:  09/16/22 245 lb (111.1 kg)  06/09/22 236 lb 3.2  oz (107.1 kg)  05/12/22 235 lb (106.6 kg)    Physical Exam Constitutional:      General: He is not in acute distress.    Appearance: He is well-developed. He is obese.     Comments: NAD  Eyes:     Conjunctiva/sclera: Conjunctivae normal.     Pupils: Pupils are equal, round, and reactive to light.  Neck:     Thyroid: No thyromegaly.     Vascular: No JVD.  Cardiovascular:     Rate and Rhythm: Normal rate and regular rhythm.     Heart sounds: Normal heart sounds. No murmur heard.    No friction rub. No gallop.  Pulmonary:     Effort: Pulmonary effort is normal. No respiratory distress.     Breath sounds:  Normal breath sounds. No wheezing or rales.  Chest:     Chest wall: No tenderness.  Abdominal:     General: Bowel sounds are normal. There is no distension.     Palpations: Abdomen is soft. There is no mass.     Tenderness: There is no abdominal tenderness. There is no guarding or rebound.  Musculoskeletal:        General: No tenderness. Normal range of motion.     Cervical back: Normal range of motion.  Lymphadenopathy:     Cervical: No cervical adenopathy.  Skin:    General: Skin is warm and dry.     Findings: No rash.  Neurological:     Mental Status: He is alert.     Cranial Nerves: No cranial nerve deficit.     Motor: No abnormal muscle tone.     Coordination: Coordination normal.     Gait: Gait abnormal.     Deep Tendon Reflexes: Reflexes are normal and symmetric.  Psychiatric:        Behavior: Behavior normal.        Thought Content: Thought content normal.   Antalgic gait   Lab Results  Component Value Date   WBC 5.1 06/06/2022   HGB 14.1 06/06/2022   HCT 42.4 06/06/2022   PLT 148.0 (L) 06/06/2022   GLUCOSE 117 (H) 06/06/2022   CHOL 246 (H) 03/28/2021   TRIG 107.0 03/28/2021   HDL 62.80 03/28/2021   LDLDIRECT 157.5 07/05/2008   LDLCALC 162 (H) 03/28/2021   ALT 10 01/30/2022   AST 15 01/30/2022   NA 137 06/06/2022   K 4.1 06/06/2022   CL 102 06/06/2022   CREATININE 0.78 06/06/2022   BUN 17 06/06/2022   CO2 27 06/06/2022   TSH 1.04 01/30/2022   PSA 0.21 03/28/2021   INR 1.1 08/23/2021   HGBA1C 5.5 02/05/2018    DG Chest Port 1 View  Result Date: 08/23/2021 CLINICAL DATA:  Concern for sepsis.  Cough and congestion. EXAM: PORTABLE CHEST 1 VIEW COMPARISON:  02/28/2021 FINDINGS: Chronic asymmetric elevation of left hemidiaphragm, which obscures the left heart border. Opacity overlying the elevated left hemidiaphragm may represent pneumonia and/or atelectasis. Right lung appears clear. No signs of pleural effusion or interstitial edema. IMPRESSION: 1.  Chronic asymmetric elevation of left hemidiaphragm. 2. Opacity overlying the left hemidiaphragm may represent pneumonia and/or atelectasis. Electronically Signed   By: Kerby Moors M.D.   On: 08/23/2021 08:32    Assessment & Plan:   Problem List Items Addressed This Visit       Cardiovascular and Mediastinum   Coronary atherosclerosis - Primary    No CP Cont w/Simvastatin      Essential hypertension  Relevant Orders   CBC with Differential/Platelet   Comprehensive metabolic panel     Nervous and Auditory   Alzheimer disease ([place])     Paxil for Zoloft.       Relevant Medications   ARIPiprazole (ABILIFY) 2 MG tablet   Other Relevant Orders   CBC with Differential/Platelet   Alzheimer's disease with behavioral disturbance (HCC)   Relevant Medications   ARIPiprazole (ABILIFY) 2 MG tablet     Other   B12 deficiency    On Paxil      Relevant Orders   CBC with Differential/Platelet   Recurrent major depressive disorder, in partial remission (HCC)   Relevant Medications   ARIPiprazole (ABILIFY) 2 MG tablet      Meds ordered this encounter  Medications   ARIPiprazole (ABILIFY) 2 MG tablet    Sig: Take 1 tablet (2 mg total) by mouth daily.    Dispense:  90 tablet    Refill:  3      Follow-up: Return in about 3 months (around 12/16/2022) for a follow-up visit.  [name], MD

## 2022-09-21 ENCOUNTER — Other Ambulatory Visit: Payer: Self-pay | Admitting: Internal Medicine

## 2022-09-23 DIAGNOSIS — Z5181 Encounter for therapeutic drug level monitoring: Secondary | ICD-10-CM | POA: Diagnosis not present

## 2022-09-23 DIAGNOSIS — G7000 Myasthenia gravis without (acute) exacerbation: Secondary | ICD-10-CM | POA: Diagnosis not present

## 2022-10-14 ENCOUNTER — Ambulatory Visit (INDEPENDENT_AMBULATORY_CARE_PROVIDER_SITE_OTHER): Payer: Medicare Other | Admitting: Family Medicine

## 2022-10-14 ENCOUNTER — Encounter: Payer: Self-pay | Admitting: Family Medicine

## 2022-10-14 VITALS — BP 100/50 | HR 73 | Temp 98.3°F | Ht 72.0 in | Wt 246.1 lb

## 2022-10-14 DIAGNOSIS — J069 Acute upper respiratory infection, unspecified: Secondary | ICD-10-CM | POA: Diagnosis not present

## 2022-10-14 LAB — POC COVID19 BINAXNOW: SARS Coronavirus 2 Ag: NEGATIVE

## 2022-10-14 MED ORDER — CEFDINIR 300 MG PO CAPS: 300.0000 mg | ORAL_CAPSULE | Freq: Two times a day (BID) | ORAL | 0 refills | Status: DC

## 2022-10-14 NOTE — Patient Instructions (Signed)
Sent in meds Mucinex or DM.

## 2022-10-14 NOTE — Progress Notes (Signed)
Subjective:     Patient ID: [name], male    DOB: August 25, 1941, 82 y.o.   MRN: [id number]  Chief Complaint  Patient presents with   Cough    Deep coughing with labored breathing, hx of pneumonia Sx started Saturday night    HPI here w/wife  Cough since 2/17.  Deep.  Some "labored" breathing.  No f/c.  H/o pneumonia.some runny nose and congestion. No v/d.  Wife had uri last wk and her covid was neg.   There are no preventive care reminders to display for this patient.  Past Medical History:  Diagnosis Date   Anemia    Blood transfusion without reported diagnosis 02/23/2014   3 units for iron deficiency anemia. as a baby - whenhe had pneumonia   BPH (benign prostatic hyperplasia)    CTS (carpal tunnel syndrome)    better   Dementia (HCC)    Dyspnea    GERD (gastroesophageal reflux disease)    Hyperlipidemia    Hypertension    "THEY DIAGNOSED ME YEARS AGO BUT LATELY IVE ACTUALLY HAD LOW BLOOD PRESSURES "    Insomnia    LBP (low back pain)    Myasthenia gravis ([place])    Osteoarthritis    Sleep apnea    no cpap    Past Surgical History:  Procedure Laterality Date   CARDIAC CATHETERIZATION N/A 08/15/2015   Procedure: Left Heart Cath and Coronary Angiography;  Surgeon: Lorretta Harp, MD;  Location: [facility];  Service: Cardiovascular;  Laterality: N/A;   CARPAL TUNNEL RELEASE Bilateral 1996, 2004   Dr Lenoard Aden thumb surgery.  more than once   COCHLEAR IMPLANT  04/10/2022   COLONOSCOPY  2005, 2012   diverticulosis.    ESOPHAGOGASTRODUODENOSCOPY N/A 02/23/2014   Procedure: ESOPHAGOGASTRODUODENOSCOPY (EGD);  Surgeon: Irene Shipper, MD;  Location: [facility];  Service: Endoscopy;  Laterality: N/A;   EYE SURGERY  2012   both cataracts, Lasik   LEFT KNEE ARTHROSCOPY Left 07/16/2017    [facility]    LUMBAR FUSION     x 3   REVERSE SHOULDER ARTHROPLASTY Left 07/04/2016   Procedure: LEFT REVERSE SHOULDER ARTHROPLASTY;  Surgeon: Netta Cedars, MD;   Location: [facility];  Service: Orthopedics;  Laterality: Left;   TONSILLECTOMY  1946   TOTAL KNEE ARTHROPLASTY  2003   Right   TOTAL KNEE ARTHROPLASTY Left 09/25/2017   Procedure: LEFT TOTAL KNEE ARTHROPLASTY;  Surgeon: Sydnee Cabal, MD;  Location: [facility];  Service: Orthopedics;  Laterality: Left;    Outpatient Medications Prior to Visit  Medication Sig Dispense Refill   ARIPiprazole (ABILIFY) 2 MG tablet Take 1 tablet (2 mg total) by mouth daily. 90 tablet 3   Cholecalciferol (VITAMIN D3) 2000 units TABS Take 2,000 Units by mouth at bedtime.     Coenzyme Q10 (CO Q-10 PO) Take 1 tablet by mouth at bedtime.     Cyanocobalamin (VITAMIN B-12) 1000 MCG SUBL Place 1 tablet (1,000 mcg total) under the tongue daily. (Patient taking differently: Place 1,000 mcg under the tongue at bedtime.) 100 tablet 3   donepezil (ARICEPT) 10 MG tablet Take 1 tablet by mouth at bedtime 30 tablet 5   famotidine (PEPCID) 40 MG tablet Take 1 tablet by mouth at bedtime 30 tablet 11   finasteride (PROSCAR) 5 MG tablet Take 1 tablet by mouth every day 30 tablet 11   furosemide (LASIX) 20 MG tablet Take 1 or 2 tablets by mouth every evening 60 tablet 11  LORazepam (ATIVAN) 1 MG tablet Take 1 tablet (1 mg total) by mouth 2 (two) times daily as needed for anxiety. 60 tablet 1   memantine (NAMENDA) 10 MG tablet Take 1 tablet by mouth twice daily 60 tablet 11   mycophenolate (CELLCEPT) 500 MG tablet Take 1,000 mg by mouth 2 (two) times daily.     potassium chloride (KLOR-CON) 10 MEQ tablet Take 1 tablet by mouth twice daily 60 tablet 11   pyridostigmine (MESTINON) 60 MG tablet One in AM , one at lunch, 2 at bedtime. (Patient taking differently: Take 60 mg by mouth 2 (two) times daily.) 450 tablet 3   tamsulosin (FLOMAX) 0.4 MG CAPS capsule Take 2 capsules by mouth every day 60 capsule 11   vitamin C (ASCORBIC ACID) 500 MG tablet Take 500 mg by mouth daily.     mycophenolate (CELLCEPT) 250 MG capsule Take 500 mg by mouth 2  (two) times daily.     sertraline (ZOLOFT) 100 MG tablet Take 1 tablet by mouth every morning 30 tablet 11   No facility-administered medications prior to visit.    Allergies  Allergen Reactions   Ferumoxytol Other (See Comments)    Stroke like symptoms; has hx of MG as well    ROS neg/noncontributory except as noted HPI/below      Objective:     BP (!) 100/50   Pulse 73   Temp 98.3 F (36.8 C) (Temporal)   Ht 6' (1.829 m)   Wt 246 lb 2 oz (111.6 kg)   SpO2 94%   BMI 33.38 kg/m  Wt Readings from Last 3 Encounters:  10/14/22 246 lb 2 oz (111.6 kg)  09/16/22 245 lb (111.1 kg)  06/09/22 236 lb 3.2 oz (107.1 kg)    Physical Exam   Gen: WDWN NAD HEENT: NCAT, conjunctiva not injected, sclera nonicteric TM WNL L, R w/wax, OP moist, no exudates runny nose NECK:  supple, no thyromegaly, no nodes CARDIAC: RRR, S1S2+, no murmur.  LUNGS: CTAB. No wheezes MSK: no gross abnormalities.  NEURO: A&O x3.  CN II-XII intact.  PSYCH: normal mood. Good eye contact  Results for orders placed or performed in visit on 10/14/22  POC COVID-19  Result Value Ref Range   SARS Coronavirus 2 Ag Negative Negative        Assessment & Plan:   Problem List Items Addressed This Visit   None Visit Diagnoses     Viral upper respiratory tract infection    -  Primary   Relevant Medications   mycophenolate (CELLCEPT) 500 MG tablet      URI-d/t mult co-morbidities, will do omnicef 316m bid x7d.  No orders of the defined types were placed in this encounter.   AWellington Hampshire MD

## 2022-11-13 ENCOUNTER — Ambulatory Visit: Payer: Medicare Other | Admitting: Adult Health

## 2022-12-12 ENCOUNTER — Other Ambulatory Visit (INDEPENDENT_AMBULATORY_CARE_PROVIDER_SITE_OTHER): Payer: Medicare Other

## 2022-12-12 DIAGNOSIS — I10 Essential (primary) hypertension: Secondary | ICD-10-CM | POA: Diagnosis not present

## 2022-12-12 DIAGNOSIS — G309 Alzheimer's disease, unspecified: Secondary | ICD-10-CM | POA: Diagnosis not present

## 2022-12-12 DIAGNOSIS — F0280 Dementia in other diseases classified elsewhere without behavioral disturbance: Secondary | ICD-10-CM | POA: Diagnosis not present

## 2022-12-12 DIAGNOSIS — E538 Deficiency of other specified B group vitamins: Secondary | ICD-10-CM

## 2022-12-12 LAB — COMPREHENSIVE METABOLIC PANEL
ALT: 13 U/L (ref 0–53)
AST: 17 U/L (ref 0–37)
Albumin: 4.1 g/dL (ref 3.5–5.2)
Alkaline Phosphatase: 63 U/L (ref 39–117)
BUN: 13 mg/dL (ref 6–23)
CO2: 27 mEq/L (ref 19–32)
Calcium: 9.3 mg/dL (ref 8.4–10.5)
Chloride: 103 mEq/L (ref 96–112)
Creatinine, Ser: 0.77 mg/dL (ref 0.40–1.50)
GFR: 83.82 mL/min (ref >60.00)
Glucose, Bld: 110 mg/dL — ABNORMAL HIGH (ref 70–99)
Potassium: 3.9 mEq/L (ref 3.5–5.1)
Sodium: 138 mEq/L (ref 135–145)
Total Bilirubin: 0.9 mg/dL (ref 0.2–1.2)
Total Protein: 6.3 g/dL (ref 6.0–8.3)

## 2022-12-12 LAB — CBC WITH DIFFERENTIAL/PLATELET
Basophils Absolute: 0 10*3/uL (ref 0.0–0.1)
Basophils Relative: 0.7 % (ref 0.0–3.0)
Eosinophils Absolute: 0.1 10*3/uL (ref 0.0–0.7)
Eosinophils Relative: 2.9 % (ref 0.0–5.0)
HCT: 43.2 % (ref 39.0–52.0)
Hemoglobin: 14.2 g/dL (ref 13.0–17.0)
Lymphocytes Relative: 19.7 % (ref 12.0–46.0)
Lymphs Abs: 0.8 10*3/uL (ref 0.7–4.0)
MCHC: 33 g/dL (ref 30.0–36.0)
MCV: 93 fl (ref 78.0–100.0)
Monocytes Absolute: 0.6 10*3/uL (ref 0.1–1.0)
Monocytes Relative: 14.2 % — ABNORMAL HIGH (ref 3.0–12.0)
Neutro Abs: 2.6 10*3/uL (ref 1.4–7.7)
Neutrophils Relative %: 62.5 % (ref 43.0–77.0)
Platelets: 151 10*3/uL (ref 150.0–400.0)
RBC: 4.64 Mil/uL (ref 4.22–5.81)
RDW: 13.8 % (ref 11.5–15.5)
WBC: 4.1 10*3/uL (ref 4.0–10.5)

## 2022-12-16 ENCOUNTER — Encounter: Payer: Self-pay | Admitting: Internal Medicine

## 2022-12-16 ENCOUNTER — Ambulatory Visit (INDEPENDENT_AMBULATORY_CARE_PROVIDER_SITE_OTHER): Payer: Medicare Other | Admitting: Internal Medicine

## 2022-12-16 VITALS — BP 120/80 | HR 80 | Temp 97.8°F | Ht 72.0 in | Wt 249.0 lb

## 2022-12-16 DIAGNOSIS — D500 Iron deficiency anemia secondary to blood loss (chronic): Secondary | ICD-10-CM

## 2022-12-16 DIAGNOSIS — I2510 Atherosclerotic heart disease of native coronary artery without angina pectoris: Secondary | ICD-10-CM | POA: Diagnosis not present

## 2022-12-16 DIAGNOSIS — R739 Hyperglycemia, unspecified: Secondary | ICD-10-CM | POA: Diagnosis not present

## 2022-12-16 DIAGNOSIS — F05 Delirium due to known physiological condition: Secondary | ICD-10-CM

## 2022-12-16 DIAGNOSIS — E538 Deficiency of other specified B group vitamins: Secondary | ICD-10-CM

## 2022-12-16 MED ORDER — ARIPIPRAZOLE 5 MG PO TABS
5.0000 mg | ORAL_TABLET | Freq: Every day | ORAL | 1 refills | Status: DC
Start: 2022-12-16 — End: 2023-08-13

## 2022-12-16 NOTE — Assessment & Plan Note (Signed)
On B12 

## 2022-12-16 NOTE — Assessment & Plan Note (Signed)
Suboptimal Will increase Abilify  Potential benefits of a long term Abilify use as well as potential risks  and complications were explained to the patient and were aknowledged.

## 2022-12-16 NOTE — Progress Notes (Signed)
Subjective:  Patient ID: [name], male    DOB: 09/01/1940  Age: 82 y.o. MRN: [id number]  CC: No chief complaint on file.   HPI Riot Waterworth Schone presents for dementia, B12 def, sundowning - worse. He is here w/[name], his wife   Outpatient Medications Prior to Visit  Medication Sig Dispense Refill   cefdinir (OMNICEF) 300 MG capsule Take 1 capsule (300 mg total) by mouth 2 (two) times daily. 14 capsule 0   Cholecalciferol (VITAMIN D3) 2000 units TABS Take 2,000 Units by mouth at bedtime.     Coenzyme Q10 (CO Q-10 PO) Take 1 tablet by mouth at bedtime.     Cyanocobalamin (VITAMIN B-12) 1000 MCG SUBL Place 1 tablet (1,000 mcg total) under the tongue daily. (Patient taking differently: Place 1,000 mcg under the tongue at bedtime.) 100 tablet 3   donepezil (ARICEPT) 10 MG tablet Take 1 tablet by mouth at bedtime 30 tablet 5   famotidine (PEPCID) 40 MG tablet Take 1 tablet by mouth at bedtime 30 tablet 11   finasteride (PROSCAR) 5 MG tablet Take 1 tablet by mouth every day 30 tablet 11   furosemide (LASIX) 20 MG tablet Take 1 or 2 tablets by mouth every evening 60 tablet 11   LORazepam (ATIVAN) 1 MG tablet Take 1 tablet (1 mg total) by mouth 2 (two) times daily as needed for anxiety. 60 tablet 1   memantine (NAMENDA) 10 MG tablet Take 1 tablet by mouth twice daily 60 tablet 11   mycophenolate (CELLCEPT) 500 MG tablet Take 1,000 mg by mouth 2 (two) times daily.     potassium chloride (KLOR-CON) 10 MEQ tablet Take 1 tablet by mouth twice daily 60 tablet 11   pyridostigmine (MESTINON) 60 MG tablet One in AM , one at lunch, 2 at bedtime. (Patient taking differently: Take 60 mg by mouth 2 (two) times daily.) 450 tablet 3   tamsulosin (FLOMAX) 0.4 MG CAPS capsule Take 2 capsules by mouth every day 60 capsule 11   vitamin C (ASCORBIC ACID) 500 MG tablet Take 500 mg by mouth daily.     ARIPiprazole (ABILIFY) 2 MG tablet Take 1 tablet (2 mg total) by mouth daily. 90 tablet 3   No  facility-administered medications prior to visit.    ROS: Review of Systems  Constitutional:  Negative for appetite change, fatigue and unexpected weight change.  HENT:  Negative for congestion, nosebleeds, sneezing, sore throat and trouble swallowing.   Eyes:  Negative for itching and visual disturbance.  Respiratory:  Negative for cough.   Cardiovascular:  Negative for chest pain, palpitations and leg swelling.  Gastrointestinal:  Negative for abdominal distention, blood in stool, diarrhea and nausea.  Genitourinary:  Negative for frequency and hematuria.  Musculoskeletal:  Positive for arthralgias and back pain. Negative for gait problem, joint swelling and neck pain.  Skin:  Negative for rash.  Neurological:  Negative for dizziness, tremors, speech difficulty and weakness.  Psychiatric/Behavioral:  Positive for confusion. Negative for agitation, dysphoric mood and sleep disturbance. The patient is not nervous/anxious.     Objective:  BP 120/80 (BP Location: Left Arm, Patient Position: Sitting, Cuff Size: Normal)   Pulse 80   Temp 97.8 F (36.6 C) (Oral)   Ht 6' (1.829 m)   Wt 249 lb (112.9 kg)   SpO2 92%   BMI 33.77 kg/m   BP Readings from Last 3 Encounters:  12/16/22 120/80  10/14/22 (!) 100/50  09/16/22 120/68    Wt Readings  from Last 3 Encounters:  12/16/22 249 lb (112.9 kg)  10/14/22 246 lb 2 oz (111.6 kg)  09/16/22 245 lb (111.1 kg)    Physical Exam Constitutional:      General: He is not in acute distress.    Appearance: He is well-developed. He is obese.     Comments: NAD  Eyes:     Conjunctiva/sclera: Conjunctivae normal.     Pupils: Pupils are equal, round, and reactive to light.  Neck:     Thyroid: No thyromegaly.     Vascular: No JVD.  Cardiovascular:     Rate and Rhythm: Normal rate and regular rhythm.     Heart sounds: Normal heart sounds. No murmur heard.    No friction rub. No gallop.  Pulmonary:     Effort: Pulmonary effort is normal. No  respiratory distress.     Breath sounds: Normal breath sounds. No wheezing or rales.  Chest:     Chest wall: No tenderness.  Abdominal:     General: Bowel sounds are normal. There is no distension.     Palpations: Abdomen is soft. There is no mass.     Tenderness: There is no abdominal tenderness. There is no guarding or rebound.  Musculoskeletal:        General: No tenderness. Normal range of motion.     Cervical back: Normal range of motion.  Lymphadenopathy:     Cervical: No cervical adenopathy.  Skin:    General: Skin is warm and dry.     Findings: No rash.  Neurological:     Mental Status: He is alert. Mental status is at baseline. He is disoriented.     Cranial Nerves: No cranial nerve deficit.     Motor: No abnormal muscle tone.     Coordination: Coordination normal.     Gait: Gait normal.     Deep Tendon Reflexes: Reflexes are normal and symmetric.  Psychiatric:        Behavior: Behavior normal.        Thought Content: Thought content normal.        Judgment: Judgment normal.     Lab Results  Component Value Date   WBC 4.1 12/12/2022   HGB 14.2 12/12/2022   HCT 43.2 12/12/2022   PLT 151.0 12/12/2022   GLUCOSE 110 (H) 12/12/2022   CHOL 246 (H) 03/28/2021   TRIG 107.0 03/28/2021   HDL 62.80 03/28/2021   LDLDIRECT 157.5 07/05/2008   LDLCALC 162 (H) 03/28/2021   ALT 13 12/12/2022   AST 17 12/12/2022   NA 138 12/12/2022   K 3.9 12/12/2022   CL 103 12/12/2022   CREATININE 0.77 12/12/2022   BUN 13 12/12/2022   CO2 27 12/12/2022   TSH 1.04 01/30/2022   PSA 0.21 03/28/2021   INR 1.1 08/23/2021   HGBA1C 5.5 02/05/2018    DG Chest Port 1 View  Result Date: 08/23/2021 CLINICAL DATA:  Concern for sepsis.  Cough and congestion. EXAM: PORTABLE CHEST 1 VIEW COMPARISON:  02/28/2021 FINDINGS: Chronic asymmetric elevation of left hemidiaphragm, which obscures the left heart border. Opacity overlying the elevated left hemidiaphragm may represent pneumonia and/or  atelectasis. Right lung appears clear. No signs of pleural effusion or interstitial edema. IMPRESSION: 1. Chronic asymmetric elevation of left hemidiaphragm. 2. Opacity overlying the left hemidiaphragm may represent pneumonia and/or atelectasis. Electronically Signed   By: Signa Kell M.D.   On: 08/23/2021 08:32    Assessment & Plan:   Problem List Items Addressed This Visit  Cardiovascular and Mediastinum   Coronary atherosclerosis    No angina. Cont w/Simvastatin        Other   Anemia, iron deficiency   Relevant Orders   CBC with Differential/Platelet   B12 deficiency - Primary    On B12      Relevant Orders   CBC with Differential/Platelet   Sundowning    Suboptimal Will increase Abilify  Potential benefits of a long term Abilify use as well as potential risks  and complications were explained to the patient and were aknowledged.       Relevant Orders   Comprehensive metabolic panel   TSH   CBC with Differential/Platelet   Other Visit Diagnoses     Hyperglycemia       Relevant Orders   Comprehensive metabolic panel   Hemoglobin A1c   TSH         Meds ordered this encounter  Medications   ARIPiprazole (ABILIFY) 5 MG tablet    Sig: Take 1 tablet (5 mg total) by mouth daily.    Dispense:  90 tablet    Refill:  1      Follow-up: Return in about 3 months (around 03/17/2023) for a follow-up visit.  Sonda Primes, MD

## 2022-12-16 NOTE — Assessment & Plan Note (Signed)
No angina. Cont w/Simvastatin 

## 2023-01-13 ENCOUNTER — Other Ambulatory Visit: Payer: Self-pay | Admitting: Internal Medicine

## 2023-01-24 ENCOUNTER — Other Ambulatory Visit: Payer: Self-pay | Admitting: Internal Medicine

## 2023-01-30 DIAGNOSIS — H903 Sensorineural hearing loss, bilateral: Secondary | ICD-10-CM | POA: Diagnosis not present

## 2023-03-11 ENCOUNTER — Ambulatory Visit (INDEPENDENT_AMBULATORY_CARE_PROVIDER_SITE_OTHER): Payer: Medicare Other

## 2023-03-11 VITALS — Ht 72.0 in | Wt 240.0 lb

## 2023-03-11 DIAGNOSIS — Z0000 Encounter for general adult medical examination without abnormal findings: Secondary | ICD-10-CM | POA: Diagnosis not present

## 2023-03-11 NOTE — Patient Instructions (Addendum)
[name] , Thank you for taking time to come for your Medicare Wellness Visit. I appreciate your ongoing commitment to your health goals. Please review the following plan we discussed and let me know if I can assist you in the future.   These are the goals we discussed:  Goals      DIET - INCREASE WATER INTAKE     Set a specific goal. Having a specific goal can be helpful for keeping track of your water intake. You can set your water goal in a lot of different ways. For example, you can strive to drink a glass of water every hour or to drink a certain amount per day. The most important aspect of your goal is that it's specific. If, for example, you intend to drink a glass of water every hour, be clear on what those hours are. If you plan to drink a certain amount of water each day, set an amount measured in cups or ounces.         This is a list of the screening recommended for you and due dates:  Health Maintenance  Topic Date Due   COVID-19 Vaccine (6 - 2023-24 season) 08/01/2022   Flu Shot  03/26/2023   Medicare Annual Wellness Visit  03/10/2024   DTaP/Tdap/Td vaccine (4 - Td or Tdap) 12/14/2032   Pneumonia Vaccine  Completed   Zoster (Shingles) Vaccine  Completed   HPV Vaccine  Aged Out    Advanced directives: Yes  Conditions/risks identified: Yes  Next appointment: It was nice speaking with you today! Your next Annual Wellness Visit is scheduled for 03/14/2024 at 2:45 p.m. via telephone with Nurse Percell Miller, Nurse Health Advisor.  If you need to reschedule or cancel, please call [phone].  Preventive Care 82 Years and Older, Male  Preventive care refers to lifestyle choices and visits with your health care provider that can promote health and wellness. What does preventive care include? A yearly physical exam. This is also called an annual well check. Dental exams once or twice a year. Routine eye exams. Ask your health care provider how often you should have your  eyes checked. Personal lifestyle choices, including: Daily care of your teeth and gums. Regular physical activity. Eating a healthy diet. Avoiding tobacco and drug use. Limiting alcohol use. Practicing safe sex. Taking low doses of aspirin every day. Taking vitamin and mineral supplements as recommended by your health care provider. What happens during an annual well check? The services and screenings done by your health care provider during your annual well check will depend on your age, overall health, lifestyle risk factors, and family history of disease. Counseling  Your health care provider may ask you questions about your: Alcohol use. Tobacco use. Drug use. Emotional well-being. Home and relationship well-being. Sexual activity. Eating habits. History of falls. Memory and ability to understand (cognition). Work and work Astronomer. Screening  You may have the following tests or measurements: Height, weight, and BMI. Blood pressure. Lipid and cholesterol levels. These may be checked every 5 years, or more frequently if you are over 68 years old. Skin check. Lung cancer screening. You may have this screening every year starting at age 74 if you have a 30-pack-year history of smoking and currently smoke or have quit within the past 15 years. Fecal occult blood test (FOBT) of the stool. You may have this test every year starting at age 103. Flexible sigmoidoscopy or colonoscopy. You may have a sigmoidoscopy every 5 years or  a colonoscopy every 10 years starting at age 37. Prostate cancer screening. Recommendations will vary depending on your family history and other risks. Hepatitis C blood test. Hepatitis B blood test. Sexually transmitted disease (STD) testing. Diabetes screening. This is done by checking your blood sugar (glucose) after you have not eaten for a while (fasting). You may have this done every 1-3 years. Abdominal aortic aneurysm (AAA) screening. You may need  this if you are a current or former smoker. Osteoporosis. You may be screened starting at age 59 if you are at high risk. Talk with your health care provider about your test results, treatment options, and if necessary, the need for more tests. Vaccines  Your health care provider may recommend certain vaccines, such as: Influenza vaccine. This is recommended every year. Tetanus, diphtheria, and acellular pertussis (Tdap, Td) vaccine. You may need a Td booster every 10 years. Zoster vaccine. You may need this after age 72. Pneumococcal 13-valent conjugate (PCV13) vaccine. One dose is recommended after age 48. Pneumococcal polysaccharide (PPSV23) vaccine. One dose is recommended after age 55. Talk to your health care provider about which screenings and vaccines you need and how often you need them. This information is not intended to replace advice given to you by your health care provider. Make sure you discuss any questions you have with your health care provider. Document Released: 09/07/2015 Document Revised: 04/30/2016 Document Reviewed: 06/12/2015 Elsevier Interactive Patient Education  2017 [employer].  Fall Prevention in the Home Falls can cause injuries. They can happen to people of all ages. There are many things you can do to make your home safe and to help prevent falls. What can I do on the outside of my home? Regularly fix the edges of walkways and driveways and fix any cracks. Remove anything that might make you trip as you walk through a door, such as a raised step or threshold. Trim any bushes or trees on the path to your home. Use bright outdoor lighting. Clear any walking paths of anything that might make someone trip, such as rocks or tools. Regularly check to see if handrails are loose or broken. Make sure that both sides of any steps have handrails. Any raised decks and porches should have guardrails on the edges. Have any leaves, snow, or ice cleared regularly. Use  sand or salt on walking paths during winter. Clean up any spills in your garage right away. This includes oil or grease spills. What can I do in the bathroom? Use night lights. Install grab bars by the toilet and in the tub and shower. Do not use towel bars as grab bars. Use non-skid mats or decals in the tub or shower. If you need to sit down in the shower, use a plastic, non-slip stool. Keep the floor dry. Clean up any water that spills on the floor as soon as it happens. Remove soap buildup in the tub or shower regularly. Attach bath mats securely with double-sided non-slip rug tape. Do not have throw rugs and other things on the floor that can make you trip. What can I do in the bedroom? Use night lights. Make sure that you have a light by your bed that is easy to reach. Do not use any sheets or blankets that are too big for your bed. They should not hang down onto the floor. Have a firm chair that has side arms. You can use this for support while you get dressed. Do not have throw rugs and other  things on the floor that can make you trip. What can I do in the kitchen? Clean up any spills right away. Avoid walking on wet floors. Keep items that you use a lot in easy-to-reach places. If you need to reach something above you, use a strong step stool that has a grab bar. Keep electrical cords out of the way. Do not use floor polish or wax that makes floors slippery. If you must use wax, use non-skid floor wax. Do not have throw rugs and other things on the floor that can make you trip. What can I do with my stairs? Do not leave any items on the stairs. Make sure that there are handrails on both sides of the stairs and use them. Fix handrails that are broken or loose. Make sure that handrails are as long as the stairways. Check any carpeting to make sure that it is firmly attached to the stairs. Fix any carpet that is loose or worn. Avoid having throw rugs at the top or bottom of the  stairs. If you do have throw rugs, attach them to the floor with carpet tape. Make sure that you have a light switch at the top of the stairs and the bottom of the stairs. If you do not have them, ask someone to add them for you. What else can I do to help prevent falls? Wear shoes that: Do not have high heels. Have rubber bottoms. Are comfortable and fit you well. Are closed at the toe. Do not wear sandals. If you use a stepladder: Make sure that it is fully opened. Do not climb a closed stepladder. Make sure that both sides of the stepladder are locked into place. Ask someone to hold it for you, if possible. Clearly mark and make sure that you can see: Any grab bars or handrails. First and last steps. Where the edge of each step is. Use tools that help you move around (mobility aids) if they are needed. These include: Canes. Walkers. Scooters. Crutches. Turn on the lights when you go into a dark area. Replace any light bulbs as soon as they burn out. Set up your furniture so you have a clear path. Avoid moving your furniture around. If any of your floors are uneven, fix them. If there are any pets around you, be aware of where they are. Review your medicines with your doctor. Some medicines can make you feel dizzy. This can increase your chance of falling. Ask your doctor what other things that you can do to help prevent falls. This information is not intended to replace advice given to you by your health care provider. Make sure you discuss any questions you have with your health care provider. Document Released: 06/07/2009 Document Revised: 01/17/2016 Document Reviewed: 09/15/2014 Elsevier Interactive Patient Education  2017 [employer].

## 2023-03-11 NOTE — Progress Notes (Addendum)
Subjective:   [name] is a 82 y.o. male who presents for Medicare Annual/Subsequent preventive examination.  Visit Complete: Virtual  I connected with  [name] on 03/11/23 by a audio enabled telemedicine application and verified that I am speaking with the correct person using two identifiers.  Patient Location: Home  Provider Location: Office/Clinic  I discussed the limitations of evaluation and management by telemedicine. The patient expressed understanding and agreed to proceed.  Per patient no change in vitals since last visit, unable to obtain new vitals due to telehealth visit  Review of Systems     Cardiac Risk Factors include: advanced age (>37men, >50 women);dyslipidemia;family history of premature cardiovascular disease;hypertension;male gender;obesity (BMI >30kg/m2);sedentary lifestyle     Objective:    Today's Vitals   03/11/23 1502  Weight: 240 lb (108.9 kg)  Height: 6' (1.829 m)  PainSc: 0-No pain   Body mass index is 32.55 kg/m.     03/11/2023    3:05 PM 04/14/2022   10:19 AM 08/23/2021    7:54 AM 04/04/2021    9:35 AM 02/28/2021    9:57 AM 09/04/2020    9:10 AM 04/23/2020   11:19 AM  Advanced Directives  Does Patient Have a Medical Advance Directive? Yes Yes No Yes No No No  Type of Estate agent of [place];Living will Living will;Healthcare Power of Attorney  Living will;Healthcare Power of Attorney     Does patient want to make changes to medical advance directive?  No - Patient declined  No - Patient declined     Copy of Healthcare Power of Attorney in Chart? No - copy requested No - copy requested  No - copy requested     Would patient like information on creating a medical advance directive?   No - Patient declined    No - Patient declined    Current Medications (verified) Outpatient Encounter Medications as of 03/11/2023  Medication Sig   ARIPiprazole (ABILIFY) 5 MG tablet Take 1 tablet (5 mg total) by  mouth daily.   cefdinir (OMNICEF) 300 MG capsule Take 1 capsule (300 mg total) by mouth 2 (two) times daily.   Cholecalciferol (VITAMIN D3) 2000 units TABS Take 2,000 Units by mouth at bedtime.   Coenzyme Q10 (CO Q-10 PO) Take 1 tablet by mouth at bedtime.   Cyanocobalamin (VITAMIN B-12) 1000 MCG SUBL Place 1 tablet (1,000 mcg total) under the tongue daily. (Patient taking differently: Place 1,000 mcg under the tongue at bedtime.)   donepezil (ARICEPT) 10 MG tablet Take 1 tablet by mouth at bedtime   famotidine (PEPCID) 40 MG tablet Take 1 tablet (40 mg total) by mouth at bedtime. Follow-up appt due in July must see provider for future refills   finasteride (PROSCAR) 5 MG tablet Take 1 tablet by mouth every day   furosemide (LASIX) 20 MG tablet Take 1 or 2 tablets by mouth every evening   LORazepam (ATIVAN) 1 MG tablet Take 1 tablet (1 mg total) by mouth 2 (two) times daily as needed for anxiety.   memantine (NAMENDA) 10 MG tablet Take 1 tablet by mouth twice daily   mycophenolate (CELLCEPT) 500 MG tablet Take 1,000 mg by mouth 2 (two) times daily.   potassium chloride (KLOR-CON) 10 MEQ tablet Take 1 tablet by mouth twice daily   pyridostigmine (MESTINON) 60 MG tablet One in AM , one at lunch, 2 at bedtime. (Patient taking differently: Take 60 mg by mouth 2 (two) times daily.)  tamsulosin (FLOMAX) 0.4 MG CAPS capsule Take 2 capsules by mouth every day   vitamin C (ASCORBIC ACID) 500 MG tablet Take 500 mg by mouth daily.   No facility-administered encounter medications on file as of 03/11/2023.    Allergies (verified) Ferumoxytol and Doxycycline   History: Past Medical History:  Diagnosis Date   Anemia    Blood transfusion without reported diagnosis 02/23/2014   3 units for iron deficiency anemia. as a baby - whenhe had pneumonia   BPH (benign prostatic hyperplasia)    CTS (carpal tunnel syndrome)    better   Dementia (HCC)    Dyspnea    GERD (gastroesophageal reflux disease)     Hyperlipidemia    Hypertension    "THEY DIAGNOSED ME YEARS AGO BUT LATELY IVE ACTUALLY HAD LOW BLOOD PRESSURES "    Insomnia    LBP (low back pain)    Myasthenia gravis (HCC)    Osteoarthritis    Sleep apnea    no cpap   Past Surgical History:  Procedure Laterality Date   CARDIAC CATHETERIZATION N/A 08/15/2015   Procedure: Left Heart Cath and Coronary Angiography;  Surgeon: Runell Gess, MD;  Location: [facility];  Service: Cardiovascular;  Laterality: N/A;   CARPAL TUNNEL RELEASE Bilateral 1996, 2004   Dr Margretta Sidle thumb surgery.  more than once   COCHLEAR IMPLANT  04/10/2022   COLONOSCOPY  2005, 2012   diverticulosis.    ESOPHAGOGASTRODUODENOSCOPY N/A 02/23/2014   Procedure: ESOPHAGOGASTRODUODENOSCOPY (EGD);  Surgeon: Hilarie Fredrickson, MD;  Location: [facility] ENDOSCOPY;  Service: Endoscopy;  Laterality: N/A;   EYE SURGERY  2012   both cataracts, Lasik   LEFT KNEE ARTHROSCOPY Left 07/16/2017    [facility]    LUMBAR FUSION     x 3   REVERSE SHOULDER ARTHROPLASTY Left 07/04/2016   Procedure: LEFT REVERSE SHOULDER ARTHROPLASTY;  Surgeon: Beverely Low, MD;  Location: [facility];  Service: Orthopedics;  Laterality: Left;   TONSILLECTOMY  1946   TOTAL KNEE ARTHROPLASTY  2003   Right   TOTAL KNEE ARTHROPLASTY Left 09/25/2017   Procedure: LEFT TOTAL KNEE ARTHROPLASTY;  Surgeon: Eugenia Mcalpine, MD;  Location: [facility] ORS;  Service: Orthopedics;  Laterality: Left;   Family History  Problem Relation Age of Onset   Aneurysm Father        AAA   Heart disease Father 15       CHF   Alzheimer's disease Father    Cancer Brother    Alzheimer's disease Brother        dementia   Stomach cancer Brother    Cancer Mother 7       breast cancer   Heart disease Mother 56       MI   Autoimmune disease Son    CAD Brother    Colon cancer Neg Hx    Esophageal cancer Neg Hx    Pancreatic cancer Neg Hx    Prostate cancer Neg Hx    Rectal cancer Neg Hx    Social History   Socioeconomic History    Marital status: Married    Spouse name: Not on file   Number of children: Not on file   Years of education: Not on file   Highest education level: Not on file  Occupational History   Occupation: Retired    Associate Professor: RETIRED    Comment: Curator  Tobacco Use   Smoking status: Former    Current packs/day: 0.00    Types: Cigarettes  Quit date: 01/08/1987    Years since quitting: 36.1   Smokeless tobacco: Never  Vaping Use   Vaping status: Never Used  Substance and Sexual Activity   Alcohol use: Yes    Alcohol/week: 0.0 standard drinks of alcohol    Comment: rare   Drug use: No   Sexual activity: Not Currently  Other Topics Concern   Not on file  Social History Narrative   He lives with wife in a 2 story home (bedroom is downstairs).  Has 2 children.   Retired Curator.   Highest level of education:  High school   Regular Exercise -  YES   Social Determinants of Health   Financial Resource Strain: Low Risk  (03/11/2023)   Overall Financial Resource Strain (CARDIA)    Difficulty of Paying Living Expenses: Not hard at all  Food Insecurity: No Food Insecurity (03/11/2023)   Hunger Vital Sign    Worried About Running Out of Food in the Last Year: Never true    Ran Out of Food in the Last Year: Never true  Transportation Needs: No Transportation Needs (03/11/2023)   PRAPARE - Administrator, Civil Service (Medical): No    Lack of Transportation (Non-Medical): No  Physical Activity: Inactive (03/11/2023)   Exercise Vital Sign    Days of Exercise per Week: 0 days    Minutes of Exercise per Session: 0 min  Stress: No Stress Concern Present (03/11/2023)   [employer] of Occupational Health - Occupational Stress Questionnaire    Feeling of Stress : Not at all  Social Connections: Socially Integrated (03/11/2023)   Social Connection and Isolation Panel [NHANES]    Frequency of Communication with Friends and Family: More than three times a week    Frequency of  Social Gatherings with Friends and Family: More than three times a week    Attends Religious Services: More than 4 times per year    Active Member of [employer] or Organizations: Yes    Attends Engineer, structural: More than 4 times per year    Marital Status: Married    Tobacco Counseling Counseling given: Not Answered   Clinical Intake:  Pre-visit preparation completed: Yes  Pain : No/denies pain Pain Score: 0-No pain     BMI - recorded: 32.55 Nutritional Status: BMI > 30  Obese Nutritional Risks: None Diabetes: No  How often do you need to have someone help you when you read instructions, pamphlets, or other written materials from your doctor or pharmacy?: 1 - Never What is the last grade level you completed in school?: HSG  Interpreter Needed?: No  Information entered by :: Tria Noguera N. Martina Brodbeck, LPN.   Activities of Daily Living    03/11/2023    3:08 PM 04/14/2022   10:21 AM  In your present state of health, do you have any difficulty performing the following activities:  Hearing? 1 1  Comment Hearing aid in left ear, cochlear implant in right ear.  Patient of [facility].   Vision? 1 0  Comment Has some vision issues due to Myasthemia Gravis   Difficulty concentrating or making decisions? 1 1  Walking or climbing stairs? 1 0  Dressing or bathing? 0 0  Doing errands, shopping? 0 1  Preparing Food and eating ? N N  Using the Toilet? N N  In the past six months, have you accidently leaked urine? N N  Do you have problems with loss of bowel control? N N  Managing  your Medications? N N  Managing your Finances? N N  Housekeeping or managing your Housekeeping? [place]    Patient Care Team: [name], Georgina Quint, MD as PCP - General Adam, Sanda Klein, MD as Referring Physician (Hematology) Doreene Nest, MD as Referring Physician (Neurology) [place] Planas [place] Lee, MD as Referring Physician (Neurology) Shon Millet, MD as Consulting Physician  (Ophthalmology) [facility] as Consulting Physician (Audiology)  Indicate any recent Medical Services you may have received from other than [facility] providers in the past year (date may be approximate).     Assessment:   This is a routine wellness examination for Laurance.  Hearing/Vision screen Hearing Screening - Comments:: Hearing aid in left ear, cochlear implant in right ear. Patient of [facility] with Dr. Caprice Renshaw. Vision Screening - Comments:: Wears rx glasses - up to date with routine eye exams with Shari Prows, MD.   Dietary issues and exercise activities discussed:     Goals Addressed             This Visit's Progress    DIET - INCREASE WATER INTAKE       Set a specific goal. Having a specific goal can be helpful for keeping track of your water intake. You can set your water goal in a lot of different ways. For example, you can strive to drink a glass of water every hour or to drink a certain amount per day. The most important aspect of your goal is that it's specific. If, for example, you intend to drink a glass of water every hour, be clear on what those hours are. If you plan to drink a certain amount of water each day, set an amount measured in cups or ounces.       Depression Screen    03/11/2023    3:17 PM 12/16/2022    9:06 AM 10/14/2022    2:35 PM 09/16/2022   10:19 AM 04/14/2022   10:18 AM 03/19/2022    2:54 PM 05/16/2021    2:08 PM  PHQ 2/9 Scores  PHQ - 2 Score 0 0 2 0 0 0 0  PHQ- 9 Score 0 0 4 0 3 3     Fall Risk    03/11/2023    3:07 PM 12/16/2022    9:05 AM 10/14/2022    2:35 PM 09/16/2022   10:19 AM 04/14/2022   10:07 AM  Fall Risk   Falls in the past year? 0 0 0 0 0  Number falls in past yr: 0 0 0 0 0  Injury with Fall? 0 0 0 0 0  Risk for fall due to : No Fall Risks  No Fall Risks No Fall Risks No Fall Risks  Follow up Falls prevention discussed Falls evaluation completed Falls evaluation completed Falls evaluation  completed Falls evaluation completed    MEDICARE RISK AT HOME:  Medicare Risk at Home - 03/11/23 1506     Any stairs in or around the home? Yes    If so, are there any without handrails? No    Home free of loose throw rugs in walkways, pet beds, electrical cords, etc? Yes    Adequate lighting in your home to reduce risk of falls? Yes    Life alert? No    Use of a cane, walker or w/c? Yes    Grab bars in the bathroom? Yes    Shower chair or bench in shower? Yes    Elevated toilet  seat or a handicapped toilet? Yes             TIMED UP AND GO:  Was the test performed?  No    Cognitive Function:    03/11/2023    3:15 PM 04/12/2018    2:24 PM 02/05/2018   11:03 AM  MMSE - Mini Mental State Exam  Not completed: Unable to complete    Orientation to time  3 4  Orientation to Place  5 4  Registration  3 3  Attention/ Calculation  5 5  Recall  0 1  Language- name 2 objects  2 2  Language- repeat  1 0  Language- follow 3 step command  3 3  Language- read & follow direction  0 1  Write a sentence  1 1  Copy design  1 1  Total score  24 25        Immunizations Immunization History  Administered Date(s) Administered   COVID-19, mRNA, vaccine(Comirnaty)12 years and older 06/06/2022   Fluad Quad(high Dose 65+) 05/09/2022   H1N1 08/02/2008   Influenza Whole 06/10/2006, 06/15/2008, 05/22/2009, 05/25/2010, 05/26/2011, 04/25/2012   Influenza, High Dose Seasonal PF 04/15/2017, 05/09/2019, 04/22/2020   Influenza,inj,Quad PF,6+ Mos 05/23/2013, 04/13/2014, 05/23/2015, 04/16/2016   Influenza-Unspecified 05/05/2022   PFIZER(Purple Top)SARS-COV-2 Vaccination 09/02/2019, 09/23/2019, 04/29/2020   Pfizer Covid-19 Vaccine Bivalent Booster 82yrs & up 01/06/2022   Pneumococcal Conjugate-13 07/10/2014   Pneumococcal Polysaccharide-23 06/25/2006, 12/30/2016, 04/09/2020   Td 07/04/2009   Td (Adult), 2 Lf Tetanus Toxid, Preservative Free 07/04/2009   Tdap 06/04/2013, 12/15/2022   Zoster  Recombinant(Shingrix) 12/28/2016, 05/09/2017   Zoster, Live 12/24/2006    TDAP status: Up to date  Flu Vaccine status: Up to date  Pneumococcal vaccine status: Up to date  Covid-19 vaccine status: Completed vaccines  Qualifies for Shingles Vaccine? Yes   Zostavax completed Yes   Shingrix Completed?: Yes  Screening Tests Health Maintenance  Topic Date Due   COVID-19 Vaccine (6 - 2023-24 season) 08/01/2022   INFLUENZA VACCINE  03/26/2023   Medicare Annual Wellness (AWV)  03/10/2024   DTaP/Tdap/Td (4 - Td or Tdap) 12/14/2032   Pneumonia Vaccine 61+ Years old  Completed   Zoster Vaccines- Shingrix  Completed   HPV VACCINES  Aged Out    Health Maintenance  Health Maintenance Due  Topic Date Due   COVID-19 Vaccine (6 - 2023-24 season) 08/01/2022    Colorectal cancer screening: No longer required.   Lung Cancer Screening: (Low Dose CT Chest recommended if Age 28-80 years, 20 pack-year currently smoking OR have quit w/in 15years.) does not qualify.   Lung Cancer Screening Referral: no  Additional Screening:  Hepatitis C Screening: does not qualify; Completed: no  Vision Screening: Recommended annual ophthalmology exams for early detection of glaucoma and other disorders of the eye. Is the patient up to date with their annual eye exam?  Yes  Who is the provider or what is the name of the office in which the patient attends annual eye exams? Shari Prows, MD. If pt is not established with a provider, would they like to be referred to a provider to establish care? No .   Dental Screening: Recommended annual dental exams for proper oral hygiene  Diabetic Foot Exam: N/A  Community Resource Referral / Chronic Care Management: CRR required this visit?  No   CCM required this visit?  No     Plan:     I have personally reviewed and noted the following in the patient's  chart:   Medical and social history Use of alcohol, tobacco or illicit drugs  Current medications  and supplements including opioid prescriptions. Patient is not currently taking opioid prescriptions. Functional ability and status Nutritional status Physical activity Advanced directives List of other physicians Hospitalizations, surgeries, and ER visits in previous 12 months Vitals Screenings to include cognitive, depression, and falls Referrals and appointments  In addition, I have reviewed and discussed with patient certain preventive protocols, quality metrics, and best practice recommendations. A written personalized care plan for preventive services as well as general preventive health recommendations were provided to patient.     Mickeal Needy, LPN   1/61/0960   After Visit Summary: (Mail) Due to this being a telephonic visit, the after visit summary with patients personalized plan was offered to patient via mail   Nurse Notes: Patient has current diagnosis of cognitive impairment. Patient is followed by neurology for ongoing assessment. Patient is unable to complete screening 6CIT or MMSE. Patient's wife assisted with visit for today.   Medical screening examination/treatment/procedure(s) were performed by non-physician practitioner and as supervising physician I was immediately available for consultation/collaboration.  I agree with above. Jacinta Shoe, MD

## 2023-03-13 ENCOUNTER — Other Ambulatory Visit: Payer: Medicare Other

## 2023-03-13 ENCOUNTER — Other Ambulatory Visit: Payer: Self-pay

## 2023-03-13 DIAGNOSIS — D500 Iron deficiency anemia secondary to blood loss (chronic): Secondary | ICD-10-CM | POA: Diagnosis not present

## 2023-03-13 DIAGNOSIS — F05 Delirium due to known physiological condition: Secondary | ICD-10-CM

## 2023-03-13 DIAGNOSIS — R739 Hyperglycemia, unspecified: Secondary | ICD-10-CM

## 2023-03-13 DIAGNOSIS — E538 Deficiency of other specified B group vitamins: Secondary | ICD-10-CM | POA: Diagnosis not present

## 2023-03-14 LAB — CBC WITH DIFFERENTIAL/PLATELET
Absolute Monocytes: 677 cells/uL (ref 200–950)
Basophils Absolute: 21 cells/uL (ref 0–200)
Basophils Relative: 0.5 %
Eosinophils Absolute: 103 cells/uL (ref 15–500)
Eosinophils Relative: 2.5 %
HCT: 41.3 % (ref 38.5–50.0)
Hemoglobin: 13.8 g/dL (ref 13.2–17.1)
Lymphs Abs: 849 cells/uL — ABNORMAL LOW (ref 850–3900)
MCH: 30.5 pg (ref 27.0–33.0)
MCHC: 33.4 g/dL (ref 32.0–36.0)
MCV: 91.4 fL (ref 80.0–100.0)
MPV: 11.1 fL (ref 7.5–12.5)
Monocytes Relative: 16.5 %
Neutro Abs: 2452 cells/uL (ref 1500–7800)
Neutrophils Relative %: 59.8 %
Platelets: 162 10*3/uL (ref 140–400)
RBC: 4.52 10*6/uL (ref 4.20–5.80)
RDW: 12.6 % (ref 11.0–15.0)
Total Lymphocyte: 20.7 %
WBC: 4.1 10*3/uL (ref 3.8–10.8)

## 2023-03-14 LAB — COMPREHENSIVE METABOLIC PANEL
AG Ratio: 1.9 (calc) (ref 1.0–2.5)
ALT: 11 U/L (ref 9–46)
AST: 13 U/L (ref 10–35)
Albumin: 4 g/dL (ref 3.6–5.1)
Alkaline phosphatase (APISO): 74 U/L (ref 35–144)
BUN: 11 mg/dL (ref 7–25)
CO2: 28 mmol/L (ref 20–32)
Calcium: 9.4 mg/dL (ref 8.6–10.3)
Chloride: 104 mmol/L (ref 98–110)
Creat: 0.78 mg/dL (ref 0.70–1.22)
Globulin: 2.1 g/dL (calc) (ref 1.9–3.7)
Glucose, Bld: 92 mg/dL (ref 65–99)
Potassium: 4.4 mmol/L (ref 3.5–5.3)
Sodium: 140 mmol/L (ref 135–146)
Total Bilirubin: 0.8 mg/dL (ref 0.2–1.2)
Total Protein: 6.1 g/dL (ref 6.1–8.1)

## 2023-03-14 LAB — HEMOGLOBIN A1C
Hgb A1c MFr Bld: 5.6 % of total Hgb (ref <5.7)
Mean Plasma Glucose: 114 mg/dL
eAG (mmol/L): 6.3 mmol/L

## 2023-03-14 LAB — TSH: TSH: 0.95 mIU/L (ref 0.40–4.50)

## 2023-03-17 ENCOUNTER — Encounter: Payer: Self-pay | Admitting: Internal Medicine

## 2023-03-17 ENCOUNTER — Ambulatory Visit (INDEPENDENT_AMBULATORY_CARE_PROVIDER_SITE_OTHER): Payer: Medicare Other | Admitting: Internal Medicine

## 2023-03-17 VITALS — BP 116/72 | HR 60 | Temp 97.3°F | Ht 72.0 in | Wt 249.0 lb

## 2023-03-17 DIAGNOSIS — G309 Alzheimer's disease, unspecified: Secondary | ICD-10-CM

## 2023-03-17 DIAGNOSIS — F01B11 Vascular dementia, moderate, with agitation: Secondary | ICD-10-CM | POA: Diagnosis not present

## 2023-03-17 DIAGNOSIS — E538 Deficiency of other specified B group vitamins: Secondary | ICD-10-CM | POA: Diagnosis not present

## 2023-03-17 DIAGNOSIS — F02818 Dementia in other diseases classified elsewhere, unspecified severity, with other behavioral disturbance: Secondary | ICD-10-CM

## 2023-03-17 DIAGNOSIS — M542 Cervicalgia: Secondary | ICD-10-CM

## 2023-03-17 DIAGNOSIS — D500 Iron deficiency anemia secondary to blood loss (chronic): Secondary | ICD-10-CM

## 2023-03-17 NOTE — Assessment & Plan Note (Signed)
On B12 

## 2023-03-17 NOTE — Patient Instructions (Signed)
Blue-Emu cream -- use 2-3 times a day ? ?

## 2023-03-17 NOTE — Assessment & Plan Note (Signed)
  Continue on Abilify - take Abilify 5 mg hs or am Okay to use lorazepam prn-low-dose ; we may need to d/c Zoloft S/p R cochlear implant in [place] - 04/10/2022 - doing better since he can hear

## 2023-03-17 NOTE — Assessment & Plan Note (Signed)
Monitor CBC 

## 2023-03-17 NOTE — Progress Notes (Signed)
Subjective:  Patient ID: [name], male    DOB: 1941/07/25  Age: 82 y.o. MRN: [id number]  CC: Medical Management of Chronic Issues (3 month f/u)   HPI [name] presents for B12 def, anemia, dementia  Outpatient Medications Prior to Visit  Medication Sig Dispense Refill   ARIPiprazole (ABILIFY) 5 MG tablet Take 1 tablet (5 mg total) by mouth daily. 90 tablet 1   Cholecalciferol (VITAMIN D3) 2000 units TABS Take 2,000 Units by mouth at bedtime.     Coenzyme Q10 (CO Q-10 PO) Take 1 tablet by mouth at bedtime.     Cyanocobalamin (VITAMIN B-12) 1000 MCG SUBL Place 1 tablet (1,000 mcg total) under the tongue daily. (Patient taking differently: Place 1,000 mcg under the tongue at bedtime.) 100 tablet 3   donepezil (ARICEPT) 10 MG tablet Take 1 tablet by mouth at bedtime 30 tablet 11   famotidine (PEPCID) 40 MG tablet Take 1 tablet (40 mg total) by mouth at bedtime. Follow-up appt due in July must see provider for future refills 30 tablet 1   finasteride (PROSCAR) 5 MG tablet Take 1 tablet by mouth every day 30 tablet 11   furosemide (LASIX) 20 MG tablet Take 1 or 2 tablets by mouth every evening 60 tablet 11   LORazepam (ATIVAN) 1 MG tablet Take 1 tablet (1 mg total) by mouth 2 (two) times daily as needed for anxiety. 60 tablet 1   memantine (NAMENDA) 10 MG tablet Take 1 tablet by mouth twice daily 60 tablet 11   mycophenolate (CELLCEPT) 500 MG tablet Take 1,000 mg by mouth 2 (two) times daily.     potassium chloride (KLOR-CON) 10 MEQ tablet Take 1 tablet by mouth twice daily 60 tablet 11   pyridostigmine (MESTINON) 60 MG tablet One in AM , one at lunch, 2 at bedtime. (Patient taking differently: Take 60 mg by mouth 2 (two) times daily.) 450 tablet 3   sertraline (ZOLOFT) 100 MG tablet Take 100 mg by mouth daily.     tamsulosin (FLOMAX) 0.4 MG CAPS capsule Take 2 capsules by mouth every day 60 capsule 11   vitamin C (ASCORBIC ACID) 500 MG tablet Take 500 mg by mouth daily.      cefdinir (OMNICEF) 300 MG capsule Take 1 capsule (300 mg total) by mouth 2 (two) times daily. 14 capsule 0   No facility-administered medications prior to visit.    ROS: Review of Systems  Constitutional:  Negative for appetite change, fatigue and unexpected weight change.  HENT:  Negative for congestion, nosebleeds, sneezing, sore throat and trouble swallowing.   Eyes:  Negative for itching and visual disturbance.  Respiratory:  Negative for cough.   Cardiovascular:  Negative for chest pain, palpitations and leg swelling.  Gastrointestinal:  Negative for abdominal distention, blood in stool, diarrhea and nausea.  Genitourinary:  Negative for frequency and hematuria.  Musculoskeletal:  Negative for back pain, gait problem, joint swelling and neck pain.  Skin:  Negative for rash.  Neurological:  Negative for dizziness, tremors, speech difficulty and weakness.  Psychiatric/Behavioral:  Positive for behavioral problems and confusion. Negative for agitation, dysphoric mood and sleep disturbance. The patient is not nervous/anxious.     Objective:  BP 116/72 (BP Location: Left Arm, Patient Position: Sitting, Cuff Size: Large)   Pulse 60   Temp (!) 97.3 F (36.3 C) (Temporal)   Ht 6' (1.829 m)   Wt 249 lb (112.9 kg)   SpO2 95%   BMI 33.77 kg/m  BP Readings from Last 3 Encounters:  03/17/23 116/72  12/16/22 120/80  10/14/22 (!) 100/50    Wt Readings from Last 3 Encounters:  03/17/23 249 lb (112.9 kg)  03/11/23 240 lb (108.9 kg)  12/16/22 249 lb (112.9 kg)    Physical Exam Constitutional:      General: He is not in acute distress.    Appearance: He is well-developed.     Comments: NAD  Eyes:     Conjunctiva/sclera: Conjunctivae normal.     Pupils: Pupils are equal, round, and reactive to light.  Neck:     Thyroid: No thyromegaly.     Vascular: No JVD.  Cardiovascular:     Rate and Rhythm: Normal rate and regular rhythm.     Heart sounds: Normal heart sounds. No  murmur heard.    No friction rub. No gallop.  Pulmonary:     Effort: Pulmonary effort is normal. No respiratory distress.     Breath sounds: Normal breath sounds. No wheezing or rales.  Chest:     Chest wall: No tenderness.  Abdominal:     General: Bowel sounds are normal. There is no distension.     Palpations: Abdomen is soft. There is no mass.     Tenderness: There is no abdominal tenderness. There is no guarding or rebound.  Musculoskeletal:        General: No tenderness. Normal range of motion.     Cervical back: Normal range of motion.  Lymphadenopathy:     Cervical: No cervical adenopathy.  Skin:    General: Skin is warm and dry.     Findings: No rash.  Neurological:     Mental Status: He is alert and oriented to person, place, and time.     Cranial Nerves: No cranial nerve deficit.     Motor: No abnormal muscle tone.     Coordination: Coordination normal.     Gait: Gait abnormal.     Deep Tendon Reflexes: Reflexes are normal and symmetric.  Psychiatric:        Behavior: Behavior normal.   Neck - sensitive w/ROM, stiff  Lab Results  Component Value Date   WBC 4.1 03/13/2023   HGB 13.8 03/13/2023   HCT 41.3 03/13/2023   PLT 162 03/13/2023   GLUCOSE 92 03/13/2023   CHOL 246 (H) 03/28/2021   TRIG 107.0 03/28/2021   HDL 62.80 03/28/2021   LDLDIRECT 157.5 07/05/2008   LDLCALC 162 (H) 03/28/2021   ALT 11 03/13/2023   AST 13 03/13/2023   NA 140 03/13/2023   K 4.4 03/13/2023   CL 104 03/13/2023   CREATININE 0.78 03/13/2023   BUN 11 03/13/2023   CO2 28 03/13/2023   TSH 0.95 03/13/2023   PSA 0.21 03/28/2021   INR 1.1 08/23/2021   HGBA1C 5.6 03/13/2023    DG Chest Port 1 View  Result Date: 08/23/2021 CLINICAL DATA:  Concern for sepsis.  Cough and congestion. EXAM: PORTABLE CHEST 1 VIEW COMPARISON:  02/28/2021 FINDINGS: Chronic asymmetric elevation of left hemidiaphragm, which obscures the left heart border. Opacity overlying the elevated left hemidiaphragm may  represent pneumonia and/or atelectasis. Right lung appears clear. No signs of pleural effusion or interstitial edema. IMPRESSION: 1. Chronic asymmetric elevation of left hemidiaphragm. 2. Opacity overlying the left hemidiaphragm may represent pneumonia and/or atelectasis. Electronically Signed   By: Signa Kell M.D.   On: 08/23/2021 08:32    Assessment & Plan:   Problem List Items Addressed This Visit  B12 deficiency    On B12      Relevant Orders   CBC with Differential/Platelet   Comprehensive metabolic panel   Anemia, iron deficiency    Monitor CBC      Relevant Orders   CBC with Differential/Platelet   Comprehensive metabolic panel   Alzheimer's disease with behavioral disturbance (HCC) - Primary     Continue on Abilify - take Abilify 5 mg hs or am Okay to use lorazepam prn-low-dose ; we may need to d/c Zoloft S/p R cochlear implant in [place] - 04/10/2022 - doing better since he can hear      Relevant Medications   sertraline (ZOLOFT) 100 MG tablet   Moderate vascular dementia with agitation (HCC)    On Abilify      Relevant Medications   sertraline (ZOLOFT) 100 MG tablet   Neck pain on right side    MSK Blue-Emu cream use 2-3 times a day Tylenol prn         No orders of the defined types were placed in this encounter.     Follow-up: Return in about 4 months (around 07/18/2023) for a follow-up visit.  Sonda Primes, MD

## 2023-03-17 NOTE — Assessment & Plan Note (Signed)
On Abilify

## 2023-03-17 NOTE — Assessment & Plan Note (Signed)
MSK Blue-Emu cream use 2-3 times a day Tylenol prn

## 2023-04-13 ENCOUNTER — Other Ambulatory Visit: Payer: Self-pay | Admitting: Internal Medicine

## 2023-04-19 ENCOUNTER — Other Ambulatory Visit: Payer: Self-pay | Admitting: Internal Medicine

## 2023-05-01 DIAGNOSIS — H903 Sensorineural hearing loss, bilateral: Secondary | ICD-10-CM | POA: Diagnosis not present

## 2023-05-24 ENCOUNTER — Other Ambulatory Visit: Payer: Self-pay | Admitting: Internal Medicine

## 2023-05-26 DIAGNOSIS — Z743 Need for continuous supervision: Secondary | ICD-10-CM | POA: Diagnosis not present

## 2023-05-26 DIAGNOSIS — Z87891 Personal history of nicotine dependence: Secondary | ICD-10-CM | POA: Diagnosis not present

## 2023-05-26 DIAGNOSIS — J986 Disorders of diaphragm: Secondary | ICD-10-CM | POA: Diagnosis not present

## 2023-05-26 DIAGNOSIS — Z66 Do not resuscitate: Secondary | ICD-10-CM | POA: Diagnosis not present

## 2023-05-26 DIAGNOSIS — G7000 Myasthenia gravis without (acute) exacerbation: Secondary | ICD-10-CM | POA: Diagnosis not present

## 2023-05-26 DIAGNOSIS — G309 Alzheimer's disease, unspecified: Secondary | ICD-10-CM | POA: Diagnosis not present

## 2023-05-26 DIAGNOSIS — J9811 Atelectasis: Secondary | ICD-10-CM | POA: Diagnosis not present

## 2023-05-26 DIAGNOSIS — Z043 Encounter for examination and observation following other accident: Secondary | ICD-10-CM | POA: Diagnosis not present

## 2023-05-26 DIAGNOSIS — R0789 Other chest pain: Secondary | ICD-10-CM | POA: Diagnosis not present

## 2023-06-01 ENCOUNTER — Ambulatory Visit: Payer: Medicare Other | Admitting: Family Medicine

## 2023-06-10 ENCOUNTER — Other Ambulatory Visit: Payer: Self-pay | Admitting: Internal Medicine

## 2023-06-21 ENCOUNTER — Other Ambulatory Visit: Payer: Self-pay | Admitting: Internal Medicine

## 2023-06-23 DIAGNOSIS — R262 Difficulty in walking, not elsewhere classified: Secondary | ICD-10-CM | POA: Diagnosis not present

## 2023-06-23 DIAGNOSIS — M6281 Muscle weakness (generalized): Secondary | ICD-10-CM | POA: Diagnosis not present

## 2023-06-24 ENCOUNTER — Other Ambulatory Visit: Payer: Self-pay | Admitting: Internal Medicine

## 2023-06-24 DIAGNOSIS — K219 Gastro-esophageal reflux disease without esophagitis: Secondary | ICD-10-CM | POA: Diagnosis not present

## 2023-06-24 DIAGNOSIS — G7000 Myasthenia gravis without (acute) exacerbation: Secondary | ICD-10-CM | POA: Diagnosis not present

## 2023-06-24 DIAGNOSIS — G309 Alzheimer's disease, unspecified: Secondary | ICD-10-CM | POA: Diagnosis not present

## 2023-06-24 DIAGNOSIS — M6281 Muscle weakness (generalized): Secondary | ICD-10-CM | POA: Diagnosis not present

## 2023-06-24 DIAGNOSIS — R2681 Unsteadiness on feet: Secondary | ICD-10-CM | POA: Diagnosis not present

## 2023-06-25 DIAGNOSIS — E559 Vitamin D deficiency, unspecified: Secondary | ICD-10-CM | POA: Diagnosis not present

## 2023-06-25 DIAGNOSIS — G7000 Myasthenia gravis without (acute) exacerbation: Secondary | ICD-10-CM | POA: Diagnosis not present

## 2023-06-25 DIAGNOSIS — R0602 Shortness of breath: Secondary | ICD-10-CM | POA: Diagnosis not present

## 2023-06-25 DIAGNOSIS — R2681 Unsteadiness on feet: Secondary | ICD-10-CM | POA: Diagnosis not present

## 2023-06-25 DIAGNOSIS — G309 Alzheimer's disease, unspecified: Secondary | ICD-10-CM | POA: Diagnosis not present

## 2023-06-25 DIAGNOSIS — R609 Edema, unspecified: Secondary | ICD-10-CM | POA: Diagnosis not present

## 2023-06-25 DIAGNOSIS — R079 Chest pain, unspecified: Secondary | ICD-10-CM | POA: Diagnosis not present

## 2023-06-25 DIAGNOSIS — M6281 Muscle weakness (generalized): Secondary | ICD-10-CM | POA: Diagnosis not present

## 2023-06-25 DIAGNOSIS — Z79899 Other long term (current) drug therapy: Secondary | ICD-10-CM | POA: Diagnosis not present

## 2023-06-29 ENCOUNTER — Telehealth: Payer: Self-pay | Admitting: Internal Medicine

## 2023-06-29 NOTE — Telephone Encounter (Signed)
Revonda Standard from [employer] called to see if a form for medical necessity for cochlear accessories was received. They said they faxed it over multiple times. Best callback for Revonda Standard is [phone].

## 2023-06-30 DIAGNOSIS — M6281 Muscle weakness (generalized): Secondary | ICD-10-CM | POA: Diagnosis not present

## 2023-06-30 DIAGNOSIS — R2681 Unsteadiness on feet: Secondary | ICD-10-CM | POA: Diagnosis not present

## 2023-06-30 NOTE — Telephone Encounter (Signed)
Fax was received and placed on PCP desk to be filled and signed .

## 2023-07-01 DIAGNOSIS — R0602 Shortness of breath: Secondary | ICD-10-CM | POA: Diagnosis not present

## 2023-07-01 DIAGNOSIS — K219 Gastro-esophageal reflux disease without esophagitis: Secondary | ICD-10-CM | POA: Diagnosis not present

## 2023-07-01 DIAGNOSIS — R262 Difficulty in walking, not elsewhere classified: Secondary | ICD-10-CM | POA: Diagnosis not present

## 2023-07-01 DIAGNOSIS — M6281 Muscle weakness (generalized): Secondary | ICD-10-CM | POA: Diagnosis not present

## 2023-07-01 DIAGNOSIS — R609 Edema, unspecified: Secondary | ICD-10-CM | POA: Diagnosis not present

## 2023-07-02 DIAGNOSIS — R2681 Unsteadiness on feet: Secondary | ICD-10-CM | POA: Diagnosis not present

## 2023-07-02 DIAGNOSIS — M6281 Muscle weakness (generalized): Secondary | ICD-10-CM | POA: Diagnosis not present

## 2023-07-06 ENCOUNTER — Emergency Department (HOSPITAL_COMMUNITY)
Admission: EM | Admit: 2023-07-06 | Discharge: 2023-07-06 | Disposition: A | Discharge status: Home / Self Care | Payer: Medicare Other | Attending: Emergency Medicine | Admitting: Emergency Medicine

## 2023-07-06 ENCOUNTER — Other Ambulatory Visit: Payer: Self-pay

## 2023-07-06 ENCOUNTER — Emergency Department (HOSPITAL_COMMUNITY): Payer: Medicare Other

## 2023-07-06 ENCOUNTER — Encounter (HOSPITAL_COMMUNITY): Payer: Self-pay

## 2023-07-06 DIAGNOSIS — R262 Difficulty in walking, not elsewhere classified: Secondary | ICD-10-CM | POA: Diagnosis not present

## 2023-07-06 DIAGNOSIS — I959 Hypotension, unspecified: Secondary | ICD-10-CM | POA: Diagnosis not present

## 2023-07-06 DIAGNOSIS — R059 Cough, unspecified: Secondary | ICD-10-CM | POA: Diagnosis not present

## 2023-07-06 DIAGNOSIS — Z1152 Encounter for screening for COVID-19: Secondary | ICD-10-CM | POA: Diagnosis not present

## 2023-07-06 DIAGNOSIS — R4689 Other symptoms and signs involving appearance and behavior: Secondary | ICD-10-CM

## 2023-07-06 DIAGNOSIS — R0989 Other specified symptoms and signs involving the circulatory and respiratory systems: Secondary | ICD-10-CM | POA: Diagnosis not present

## 2023-07-06 DIAGNOSIS — J9811 Atelectasis: Secondary | ICD-10-CM | POA: Diagnosis not present

## 2023-07-06 DIAGNOSIS — R5383 Other fatigue: Secondary | ICD-10-CM | POA: Insufficient documentation

## 2023-07-06 DIAGNOSIS — Z79899 Other long term (current) drug therapy: Secondary | ICD-10-CM | POA: Diagnosis not present

## 2023-07-06 DIAGNOSIS — R531 Weakness: Secondary | ICD-10-CM | POA: Diagnosis not present

## 2023-07-06 DIAGNOSIS — R462 Strange and inexplicable behavior: Secondary | ICD-10-CM | POA: Insufficient documentation

## 2023-07-06 DIAGNOSIS — M6281 Muscle weakness (generalized): Secondary | ICD-10-CM | POA: Diagnosis not present

## 2023-07-06 DIAGNOSIS — R410 Disorientation, unspecified: Secondary | ICD-10-CM | POA: Diagnosis not present

## 2023-07-06 DIAGNOSIS — R9389 Abnormal findings on diagnostic imaging of other specified body structures: Secondary | ICD-10-CM | POA: Diagnosis not present

## 2023-07-06 DIAGNOSIS — R0902 Hypoxemia: Secondary | ICD-10-CM | POA: Diagnosis not present

## 2023-07-06 LAB — COMPREHENSIVE METABOLIC PANEL
ALT: 17 U/L (ref 0–44)
AST: 21 U/L (ref 15–41)
Albumin: 4.2 g/dL (ref 3.5–5.0)
Alkaline Phosphatase: 89 U/L (ref 38–126)
Anion gap: 9 (ref 5–15)
BUN: 18 mg/dL (ref 8–23)
CO2: 26 mmol/L (ref 22–32)
Calcium: 9.5 mg/dL (ref 8.9–10.3)
Chloride: 102 mmol/L (ref 98–111)
Creatinine, Ser: 0.87 mg/dL (ref 0.61–1.24)
GFR, Estimated: >60 mL/min (ref >60)
Glucose, Bld: 124 mg/dL — ABNORMAL HIGH (ref 70–99)
Potassium: 3.6 mmol/L (ref 3.5–5.1)
Sodium: 137 mmol/L (ref 135–145)
Total Bilirubin: 1.1 mg/dL (ref <1.2)
Total Protein: 6.9 g/dL (ref 6.5–8.1)

## 2023-07-06 LAB — CBC WITH DIFFERENTIAL/PLATELET
Abs Immature Granulocytes: 0.01 10*3/uL (ref 0.00–0.07)
Basophils Absolute: 0 10*3/uL (ref 0.0–0.1)
Basophils Relative: 0 %
Eosinophils Absolute: 0.1 10*3/uL (ref 0.0–0.5)
Eosinophils Relative: 1 %
HCT: 45.4 % (ref 39.0–52.0)
Hemoglobin: 14.7 g/dL (ref 13.0–17.0)
Immature Granulocytes: 0 %
Lymphocytes Relative: 15 %
Lymphs Abs: 0.8 10*3/uL (ref 0.7–4.0)
MCH: 30.9 pg (ref 26.0–34.0)
MCHC: 32.4 g/dL (ref 30.0–36.0)
MCV: 95.6 fL (ref 80.0–100.0)
Monocytes Absolute: 0.9 10*3/uL (ref 0.1–1.0)
Monocytes Relative: 15 %
Neutro Abs: 3.9 10*3/uL (ref 1.7–7.7)
Neutrophils Relative %: 69 %
Platelets: 151 10*3/uL (ref 150–400)
RBC: 4.75 MIL/uL (ref 4.22–5.81)
RDW: 13.2 % (ref 11.5–15.5)
WBC: 5.7 10*3/uL (ref 4.0–10.5)
nRBC: 0 % (ref 0.0–0.2)

## 2023-07-06 LAB — RESP PANEL BY RT-PCR (RSV, FLU A&B, COVID)  RVPGX2
Influenza A by PCR: NEGATIVE
Influenza B by PCR: NEGATIVE
Resp Syncytial Virus by PCR: NEGATIVE
SARS Coronavirus 2 by RT PCR: NEGATIVE

## 2023-07-06 LAB — URINALYSIS, ROUTINE W REFLEX MICROSCOPIC
Bilirubin Urine: NEGATIVE
Glucose, UA: NEGATIVE mg/dL
Hgb urine dipstick: NEGATIVE
Ketones, ur: NEGATIVE mg/dL
Leukocytes,Ua: NEGATIVE
Nitrite: NEGATIVE
Protein, ur: NEGATIVE mg/dL
Specific Gravity, Urine: 1.009 (ref 1.005–1.030)
pH: 5 (ref 5.0–8.0)

## 2023-07-06 NOTE — ED Triage Notes (Signed)
Pt BIB EMS from [facility] due to wife stating pt appeared more lethergic, cold, less action than usual, and has a cough. Hx of Alzheimer; Dementia.

## 2023-07-06 NOTE — Discharge Instructions (Signed)
I am not sure what caused the event that she had earlier today.  Luckily nothing that we tested today shows any significant change from your normal.  Please follow-up with your family doctor in the office.  Please return for sudden worsening or recurrent symptoms.

## 2023-07-06 NOTE — ED Provider Notes (Signed)
[place] EMERGENCY DEPARTMENT AT [facility] Provider Note   CSN: [id number] Arrival date & time: 07/06/23  1534     History  Chief Complaint  Patient presents with   Fatigue   Cough    [name] is a 82 y.o. male.  82 yo M with a chief complaints of not acting normally.  The wife went to visit him at his skilled nursing facility today and noticed that he was not at his baseline.  He was then brought here for evaluation.  There is some documentation that he had been coughing.  Patient is demented and unable to provide any history.  He tells me that he has no complaints currently.  Level 5 caveat.  He presently denies cough and does not realize that he has been coughing.  Denies abdominal pain denies chest pain.   Cough      Home Medications Prior to Admission medications   Medication Sig Start Date End Date Taking? Authorizing Provider  ARIPiprazole (ABILIFY) 5 MG tablet Take 1 tablet (5 mg total) by mouth daily. 12/16/22   [name], Georgina Quint, MD  Cholecalciferol (VITAMIN D3) 2000 units TABS Take 2,000 Units by mouth at bedtime.    [provider]  Coenzyme Q10 (CO Q-10 PO) Take 1 tablet by mouth at bedtime.    [provider]  Cyanocobalamin (VITAMIN B-12) 1000 MCG SUBL Place 1 tablet (1,000 mcg total) under the tongue daily. Patient taking differently: Place 1,000 mcg under the tongue at bedtime. 02/21/14   [name], Georgina Quint, MD  donepezil (ARICEPT) 10 MG tablet Take 1 tablet by mouth at bedtime 01/13/23   [name], Georgina Quint, MD  famotidine (PEPCID) 40 MG tablet Take 1 tablet (40 mg total) by mouth daily. 04/13/23   [name], Georgina Quint, MD  finasteride (PROSCAR) 5 MG tablet Take 1 tablet by mouth every day 04/20/23   [name], Georgina Quint, MD  furosemide (LASIX) 20 MG tablet Take 1 or 2 tablets by mouth every evening 09/22/22   [name], Georgina Quint, MD  LORazepam (ATIVAN) 1 MG tablet Take 1 tablet (1 mg total) by mouth 2 (two) times  daily as needed for anxiety. 06/09/22   [name], Georgina Quint, MD  memantine (NAMENDA) 10 MG tablet Take 1 tablet by mouth twice daily 06/23/23   [name], Georgina Quint, MD  mycophenolate (CELLCEPT) 500 MG tablet Take 1,000 mg by mouth 2 (two) times daily.    [provider]  potassium chloride (KLOR-CON) 10 MEQ tablet Take 1 tablet by mouth twice daily 06/12/23   [name], Georgina Quint, MD  pyridostigmine (MESTINON) 60 MG tablet One in AM , one at lunch, 2 at bedtime. Patient taking differently: Take 60 mg by mouth 2 (two) times daily. 04/12/18   Dohmeier, Porfirio Mylar, MD  sertraline (ZOLOFT) 100 MG tablet Take 1 tablet by mouth every morning 05/24/23   [name], Georgina Quint, MD  tamsulosin (FLOMAX) 0.4 MG CAPS capsule Take 2 capsules by mouth every day 06/24/23   [name], Georgina Quint, MD  vitamin C (ASCORBIC ACID) 500 MG tablet Take 500 mg by mouth daily.    [provider]      Allergies    Ferumoxytol and Doxycycline    Review of Systems   Review of Systems  Respiratory:  Positive for cough.     Physical Exam Updated Vital Signs BP 104/76 (BP Location: Right Arm)   Pulse 90   Temp 97.7 F (36.5 C) (Oral)   Resp 18  SpO2 95%  Physical Exam Vitals and nursing note reviewed.  Constitutional:      Appearance: He is well-developed.  HENT:     Head: Normocephalic and atraumatic.  Eyes:     Pupils: Pupils are equal, round, and reactive to light.  Neck:     Vascular: No JVD.  Cardiovascular:     Rate and Rhythm: Normal rate and regular rhythm.     Heart sounds: No murmur heard.    No friction rub. No gallop.  Pulmonary:     Effort: No respiratory distress.     Breath sounds: No wheezing.  Abdominal:     General: There is no distension.     Tenderness: There is no abdominal tenderness. There is no guarding or rebound.  Musculoskeletal:        General: Normal range of motion.     Cervical back: Normal range of motion and neck supple.  Skin:    Coloration: Skin is  not pale.     Findings: No rash.  Neurological:     Mental Status: He is alert.  Psychiatric:        Behavior: Behavior normal.     ED Results / Procedures / Treatments   Labs (all labs ordered are listed, but only abnormal results are displayed) Labs Reviewed  COMPREHENSIVE METABOLIC PANEL - Abnormal; Notable for the following components:      Result Value   Glucose, Bld 124 (*)    All other components within normal limits  RESP PANEL BY RT-PCR (RSV, FLU A&B, COVID)  [id number]  CBC WITH DIFFERENTIAL/PLATELET  URINALYSIS, ROUTINE W REFLEX MICROSCOPIC    EKG EKG Interpretation Date/Time:  Monday July 06 2023 17:11:40 EST Ventricular Rate:  63 PR Interval:  200 QRS Duration:  108 QT Interval:  453 QTC Calculation: 464 R Axis:   36  Text Interpretation: Sinus rhythm Abnormal R-wave progression, early transition Baseline wander TECHNICALLY DIFFICULT No significant change since last tracing Confirmed by Melene Plan [phone]) on 07/06/2023 5:33:06 PM  Radiology No results found.  Procedures Procedures    Medications Ordered in ED Medications - No data to display  ED Course/ Medical Decision Making/ A&P                                 Medical Decision Making Amount and/or Complexity of Data Reviewed Labs: ordered. Radiology: ordered. ECG/medicine tests: ordered.   82 yo M with a chief complaints of not being at his baseline.  This was reported by the wife who called EMS and had him transported here.  Patient is unable to provide any history.  Will start with basic electrolytes CBC screen for infection with a chest x-ray UA.  Hopefully will discuss further with his spouse.  Patient's wife has arrived and provides further history.  Tells me that he had a very large piece of cake at the [employer] Day party at his nursing facility and she had also just brought him a large coffee and an apple pie from [employer].  She said he seemed very hungry and ate them all very quickly  and then after which did not feel well so that his head hurt had multiple episodes of belching and seemed less communicative than normal.  The nursing facility thought maybe he aspirated and EMS was called.  Wife is concerned that maybe his ventral hernia had been affected.  She says he is now back to his  baseline.  No leukocytosis, no anemia.  UA negative for infection.  Chest x-ray independently interpreted by me with a chronically elevated left hemidiaphragm, no obvious focal infiltrate.  COVID and flu testing is negative.  No significant electrolyte abnormalities.  No change to renal function.  Patient continues to be at his baseline.  I discussed the results with the patient and family.  I was waiting for a formal radiology read however the patient and family would like to leave at this time.  6:58 PM:  I have discussed the diagnosis/risks/treatment options with the patient and family.  Evaluation and diagnostic testing in the emergency department does not suggest an emergent condition requiring admission or immediate intervention beyond what has been performed at this time.  They will follow up with PCP. We also discussed returning to the ED immediately if new or worsening sx occur. We discussed the sx which are most concerning (e.g., sudden worsening pain, fever, inability to tolerate by mouth) that necessitate immediate return. Medications administered to the patient during their visit and any new prescriptions provided to the patient are listed below.  Medications given during this visit Medications - No data to display   The patient appears reasonably screen and/or stabilized for discharge and I doubt any other medical condition or other [facility] requiring further screening, evaluation, or treatment in the ED at this time prior to discharge.          Final Clinical Impression(s) / ED Diagnoses Final diagnoses:  Spell of abnormal behavior    Rx / DC Orders ED Discharge Orders      None         Melene Plan, DO 07/06/23 1858

## 2023-07-08 ENCOUNTER — Telehealth: Payer: Self-pay | Admitting: Internal Medicine

## 2023-07-08 DIAGNOSIS — M6281 Muscle weakness (generalized): Secondary | ICD-10-CM | POA: Diagnosis not present

## 2023-07-08 DIAGNOSIS — R262 Difficulty in walking, not elsewhere classified: Secondary | ICD-10-CM | POA: Diagnosis not present

## 2023-07-08 NOTE — Telephone Encounter (Signed)
FYI... Form has been place in Dr. [name] folder from [employer]. They are needing this form to be filled out and faxed back  over in timely manner. Please advise.

## 2023-07-09 DIAGNOSIS — R2681 Unsteadiness on feet: Secondary | ICD-10-CM | POA: Diagnosis not present

## 2023-07-09 DIAGNOSIS — M6281 Muscle weakness (generalized): Secondary | ICD-10-CM | POA: Diagnosis not present

## 2023-07-10 DIAGNOSIS — R609 Edema, unspecified: Secondary | ICD-10-CM | POA: Diagnosis not present

## 2023-07-10 DIAGNOSIS — G309 Alzheimer's disease, unspecified: Secondary | ICD-10-CM | POA: Diagnosis not present

## 2023-07-10 DIAGNOSIS — Z79899 Other long term (current) drug therapy: Secondary | ICD-10-CM | POA: Diagnosis not present

## 2023-07-10 DIAGNOSIS — G7000 Myasthenia gravis without (acute) exacerbation: Secondary | ICD-10-CM | POA: Diagnosis not present

## 2023-07-10 DIAGNOSIS — L89322 Pressure ulcer of left buttock, stage 2: Secondary | ICD-10-CM | POA: Diagnosis not present

## 2023-07-10 DIAGNOSIS — L89312 Pressure ulcer of right buttock, stage 2: Secondary | ICD-10-CM | POA: Diagnosis not present

## 2023-07-10 DIAGNOSIS — M6281 Muscle weakness (generalized): Secondary | ICD-10-CM | POA: Diagnosis not present

## 2023-07-10 DIAGNOSIS — L89152 Pressure ulcer of sacral region, stage 2: Secondary | ICD-10-CM | POA: Diagnosis not present

## 2023-07-13 DIAGNOSIS — G309 Alzheimer's disease, unspecified: Secondary | ICD-10-CM | POA: Diagnosis not present

## 2023-07-13 DIAGNOSIS — L89152 Pressure ulcer of sacral region, stage 2: Secondary | ICD-10-CM | POA: Diagnosis not present

## 2023-07-13 DIAGNOSIS — G7000 Myasthenia gravis without (acute) exacerbation: Secondary | ICD-10-CM | POA: Diagnosis not present

## 2023-07-13 DIAGNOSIS — Z79899 Other long term (current) drug therapy: Secondary | ICD-10-CM | POA: Diagnosis not present

## 2023-07-13 DIAGNOSIS — L89312 Pressure ulcer of right buttock, stage 2: Secondary | ICD-10-CM | POA: Diagnosis not present

## 2023-07-15 DIAGNOSIS — L89312 Pressure ulcer of right buttock, stage 2: Secondary | ICD-10-CM | POA: Diagnosis not present

## 2023-07-15 DIAGNOSIS — G309 Alzheimer's disease, unspecified: Secondary | ICD-10-CM | POA: Diagnosis not present

## 2023-07-15 DIAGNOSIS — L89152 Pressure ulcer of sacral region, stage 2: Secondary | ICD-10-CM | POA: Diagnosis not present

## 2023-07-15 DIAGNOSIS — Z79899 Other long term (current) drug therapy: Secondary | ICD-10-CM | POA: Diagnosis not present

## 2023-07-15 DIAGNOSIS — G7000 Myasthenia gravis without (acute) exacerbation: Secondary | ICD-10-CM | POA: Diagnosis not present

## 2023-07-16 ENCOUNTER — Ambulatory Visit: Payer: Medicare Other | Admitting: Internal Medicine

## 2023-07-16 DIAGNOSIS — L89312 Pressure ulcer of right buttock, stage 2: Secondary | ICD-10-CM | POA: Diagnosis not present

## 2023-07-16 DIAGNOSIS — Z79899 Other long term (current) drug therapy: Secondary | ICD-10-CM | POA: Diagnosis not present

## 2023-07-16 DIAGNOSIS — L89152 Pressure ulcer of sacral region, stage 2: Secondary | ICD-10-CM | POA: Diagnosis not present

## 2023-07-16 DIAGNOSIS — G309 Alzheimer's disease, unspecified: Secondary | ICD-10-CM | POA: Diagnosis not present

## 2023-07-16 DIAGNOSIS — G7000 Myasthenia gravis without (acute) exacerbation: Secondary | ICD-10-CM | POA: Diagnosis not present

## 2023-07-16 NOTE — Telephone Encounter (Signed)
I was able to speak with [name] and give her the pts ENT specialist information for this matter as Dr. Posey Rea advised.

## 2023-07-16 NOTE — Telephone Encounter (Signed)
[employer] called checking on these forms -- please call [phone] - Revonda Standard - please call and let her know when forms are ready

## 2023-07-17 DIAGNOSIS — L89312 Pressure ulcer of right buttock, stage 2: Secondary | ICD-10-CM | POA: Diagnosis not present

## 2023-07-17 DIAGNOSIS — G7000 Myasthenia gravis without (acute) exacerbation: Secondary | ICD-10-CM | POA: Diagnosis not present

## 2023-07-17 DIAGNOSIS — L89152 Pressure ulcer of sacral region, stage 2: Secondary | ICD-10-CM | POA: Diagnosis not present

## 2023-07-17 DIAGNOSIS — G309 Alzheimer's disease, unspecified: Secondary | ICD-10-CM | POA: Diagnosis not present

## 2023-07-17 DIAGNOSIS — Z79899 Other long term (current) drug therapy: Secondary | ICD-10-CM | POA: Diagnosis not present

## 2023-07-20 DIAGNOSIS — G309 Alzheimer's disease, unspecified: Secondary | ICD-10-CM | POA: Diagnosis not present

## 2023-07-20 DIAGNOSIS — Z79899 Other long term (current) drug therapy: Secondary | ICD-10-CM | POA: Diagnosis not present

## 2023-07-20 DIAGNOSIS — L89312 Pressure ulcer of right buttock, stage 2: Secondary | ICD-10-CM | POA: Diagnosis not present

## 2023-07-20 DIAGNOSIS — G7000 Myasthenia gravis without (acute) exacerbation: Secondary | ICD-10-CM | POA: Diagnosis not present

## 2023-07-20 DIAGNOSIS — L89152 Pressure ulcer of sacral region, stage 2: Secondary | ICD-10-CM | POA: Diagnosis not present

## 2023-07-24 DIAGNOSIS — L89312 Pressure ulcer of right buttock, stage 2: Secondary | ICD-10-CM | POA: Diagnosis not present

## 2023-07-24 DIAGNOSIS — L89152 Pressure ulcer of sacral region, stage 2: Secondary | ICD-10-CM | POA: Diagnosis not present

## 2023-07-24 DIAGNOSIS — G7000 Myasthenia gravis without (acute) exacerbation: Secondary | ICD-10-CM | POA: Diagnosis not present

## 2023-07-24 DIAGNOSIS — G309 Alzheimer's disease, unspecified: Secondary | ICD-10-CM | POA: Diagnosis not present

## 2023-07-24 DIAGNOSIS — Z79899 Other long term (current) drug therapy: Secondary | ICD-10-CM | POA: Diagnosis not present

## 2023-07-27 DIAGNOSIS — G7000 Myasthenia gravis without (acute) exacerbation: Secondary | ICD-10-CM | POA: Diagnosis not present

## 2023-07-27 DIAGNOSIS — L89312 Pressure ulcer of right buttock, stage 2: Secondary | ICD-10-CM | POA: Diagnosis not present

## 2023-07-27 DIAGNOSIS — Z79899 Other long term (current) drug therapy: Secondary | ICD-10-CM | POA: Diagnosis not present

## 2023-07-27 DIAGNOSIS — G309 Alzheimer's disease, unspecified: Secondary | ICD-10-CM | POA: Diagnosis not present

## 2023-07-27 DIAGNOSIS — L89152 Pressure ulcer of sacral region, stage 2: Secondary | ICD-10-CM | POA: Diagnosis not present

## 2023-07-28 DIAGNOSIS — Z79899 Other long term (current) drug therapy: Secondary | ICD-10-CM | POA: Diagnosis not present

## 2023-07-28 DIAGNOSIS — G309 Alzheimer's disease, unspecified: Secondary | ICD-10-CM | POA: Diagnosis not present

## 2023-07-28 DIAGNOSIS — G7000 Myasthenia gravis without (acute) exacerbation: Secondary | ICD-10-CM | POA: Diagnosis not present

## 2023-07-28 DIAGNOSIS — L89312 Pressure ulcer of right buttock, stage 2: Secondary | ICD-10-CM | POA: Diagnosis not present

## 2023-07-28 DIAGNOSIS — L89152 Pressure ulcer of sacral region, stage 2: Secondary | ICD-10-CM | POA: Diagnosis not present

## 2023-07-29 DIAGNOSIS — Z79899 Other long term (current) drug therapy: Secondary | ICD-10-CM | POA: Diagnosis not present

## 2023-07-29 DIAGNOSIS — L89152 Pressure ulcer of sacral region, stage 2: Secondary | ICD-10-CM | POA: Diagnosis not present

## 2023-07-29 DIAGNOSIS — G309 Alzheimer's disease, unspecified: Secondary | ICD-10-CM | POA: Diagnosis not present

## 2023-07-29 DIAGNOSIS — L89322 Pressure ulcer of left buttock, stage 2: Secondary | ICD-10-CM | POA: Diagnosis not present

## 2023-07-29 DIAGNOSIS — H903 Sensorineural hearing loss, bilateral: Secondary | ICD-10-CM | POA: Diagnosis not present

## 2023-07-29 DIAGNOSIS — G7000 Myasthenia gravis without (acute) exacerbation: Secondary | ICD-10-CM | POA: Diagnosis not present

## 2023-07-29 DIAGNOSIS — L89312 Pressure ulcer of right buttock, stage 2: Secondary | ICD-10-CM | POA: Diagnosis not present

## 2023-07-29 DIAGNOSIS — R451 Restlessness and agitation: Secondary | ICD-10-CM | POA: Diagnosis not present

## 2023-07-31 DIAGNOSIS — L89312 Pressure ulcer of right buttock, stage 2: Secondary | ICD-10-CM | POA: Diagnosis not present

## 2023-07-31 DIAGNOSIS — R8279 Other abnormal findings on microbiological examination of urine: Secondary | ICD-10-CM | POA: Diagnosis not present

## 2023-07-31 DIAGNOSIS — Z79899 Other long term (current) drug therapy: Secondary | ICD-10-CM | POA: Diagnosis not present

## 2023-07-31 DIAGNOSIS — L89152 Pressure ulcer of sacral region, stage 2: Secondary | ICD-10-CM | POA: Diagnosis not present

## 2023-07-31 DIAGNOSIS — G309 Alzheimer's disease, unspecified: Secondary | ICD-10-CM | POA: Diagnosis not present

## 2023-07-31 DIAGNOSIS — G7000 Myasthenia gravis without (acute) exacerbation: Secondary | ICD-10-CM | POA: Diagnosis not present

## 2023-07-31 DIAGNOSIS — R82993 Hyperuricosuria: Secondary | ICD-10-CM | POA: Diagnosis not present

## 2023-08-03 DIAGNOSIS — L89152 Pressure ulcer of sacral region, stage 2: Secondary | ICD-10-CM | POA: Diagnosis not present

## 2023-08-03 DIAGNOSIS — L89312 Pressure ulcer of right buttock, stage 2: Secondary | ICD-10-CM | POA: Diagnosis not present

## 2023-08-03 DIAGNOSIS — G309 Alzheimer's disease, unspecified: Secondary | ICD-10-CM | POA: Diagnosis not present

## 2023-08-03 DIAGNOSIS — G7000 Myasthenia gravis without (acute) exacerbation: Secondary | ICD-10-CM | POA: Diagnosis not present

## 2023-08-03 DIAGNOSIS — Z79899 Other long term (current) drug therapy: Secondary | ICD-10-CM | POA: Diagnosis not present

## 2023-08-04 DIAGNOSIS — L89312 Pressure ulcer of right buttock, stage 2: Secondary | ICD-10-CM | POA: Diagnosis not present

## 2023-08-04 DIAGNOSIS — L89152 Pressure ulcer of sacral region, stage 2: Secondary | ICD-10-CM | POA: Diagnosis not present

## 2023-08-04 DIAGNOSIS — Z79899 Other long term (current) drug therapy: Secondary | ICD-10-CM | POA: Diagnosis not present

## 2023-08-04 DIAGNOSIS — G7000 Myasthenia gravis without (acute) exacerbation: Secondary | ICD-10-CM | POA: Diagnosis not present

## 2023-08-04 DIAGNOSIS — G309 Alzheimer's disease, unspecified: Secondary | ICD-10-CM | POA: Diagnosis not present

## 2023-08-05 DIAGNOSIS — K219 Gastro-esophageal reflux disease without esophagitis: Secondary | ICD-10-CM | POA: Diagnosis not present

## 2023-08-05 DIAGNOSIS — G7000 Myasthenia gravis without (acute) exacerbation: Secondary | ICD-10-CM | POA: Diagnosis not present

## 2023-08-05 DIAGNOSIS — R451 Restlessness and agitation: Secondary | ICD-10-CM | POA: Diagnosis not present

## 2023-08-05 DIAGNOSIS — L89159 Pressure ulcer of sacral region, unspecified stage: Secondary | ICD-10-CM | POA: Diagnosis not present

## 2023-08-06 DIAGNOSIS — L89152 Pressure ulcer of sacral region, stage 2: Secondary | ICD-10-CM | POA: Diagnosis not present

## 2023-08-06 DIAGNOSIS — Z45321 Encounter for adjustment and management of cochlear device: Secondary | ICD-10-CM | POA: Diagnosis not present

## 2023-08-06 DIAGNOSIS — H903 Sensorineural hearing loss, bilateral: Secondary | ICD-10-CM | POA: Diagnosis not present

## 2023-08-06 DIAGNOSIS — Z79899 Other long term (current) drug therapy: Secondary | ICD-10-CM | POA: Diagnosis not present

## 2023-08-06 DIAGNOSIS — L89312 Pressure ulcer of right buttock, stage 2: Secondary | ICD-10-CM | POA: Diagnosis not present

## 2023-08-06 DIAGNOSIS — Z9621 Cochlear implant status: Secondary | ICD-10-CM | POA: Diagnosis not present

## 2023-08-06 DIAGNOSIS — G309 Alzheimer's disease, unspecified: Secondary | ICD-10-CM | POA: Diagnosis not present

## 2023-08-06 DIAGNOSIS — G7000 Myasthenia gravis without (acute) exacerbation: Secondary | ICD-10-CM | POA: Diagnosis not present

## 2023-08-10 DIAGNOSIS — G7000 Myasthenia gravis without (acute) exacerbation: Secondary | ICD-10-CM | POA: Diagnosis not present

## 2023-08-10 DIAGNOSIS — G309 Alzheimer's disease, unspecified: Secondary | ICD-10-CM | POA: Diagnosis not present

## 2023-08-10 DIAGNOSIS — Z79899 Other long term (current) drug therapy: Secondary | ICD-10-CM | POA: Diagnosis not present

## 2023-08-10 DIAGNOSIS — L89152 Pressure ulcer of sacral region, stage 2: Secondary | ICD-10-CM | POA: Diagnosis not present

## 2023-08-10 DIAGNOSIS — L89312 Pressure ulcer of right buttock, stage 2: Secondary | ICD-10-CM | POA: Diagnosis not present

## 2023-08-11 ENCOUNTER — Emergency Department (HOSPITAL_COMMUNITY): Payer: Medicare Other

## 2023-08-11 ENCOUNTER — Emergency Department (HOSPITAL_COMMUNITY)
Admission: EM | Admit: 2023-08-11 | Discharge: 2023-08-11 | Disposition: A | Discharge status: Skilled Nursing Facility | Payer: Medicare Other | Attending: Emergency Medicine | Admitting: Emergency Medicine

## 2023-08-11 ENCOUNTER — Encounter (HOSPITAL_COMMUNITY): Payer: Self-pay

## 2023-08-11 ENCOUNTER — Other Ambulatory Visit: Payer: Self-pay

## 2023-08-11 DIAGNOSIS — I10 Essential (primary) hypertension: Secondary | ICD-10-CM | POA: Diagnosis not present

## 2023-08-11 DIAGNOSIS — F0390 Unspecified dementia without behavioral disturbance: Secondary | ICD-10-CM | POA: Insufficient documentation

## 2023-08-11 DIAGNOSIS — I110 Hypertensive heart disease with heart failure: Secondary | ICD-10-CM | POA: Diagnosis not present

## 2023-08-11 DIAGNOSIS — K760 Fatty (change of) liver, not elsewhere classified: Secondary | ICD-10-CM | POA: Diagnosis not present

## 2023-08-11 DIAGNOSIS — R918 Other nonspecific abnormal finding of lung field: Secondary | ICD-10-CM | POA: Diagnosis not present

## 2023-08-11 DIAGNOSIS — Z20822 Contact with and (suspected) exposure to covid-19: Secondary | ICD-10-CM | POA: Diagnosis not present

## 2023-08-11 DIAGNOSIS — R112 Nausea with vomiting, unspecified: Secondary | ICD-10-CM | POA: Insufficient documentation

## 2023-08-11 DIAGNOSIS — R1084 Generalized abdominal pain: Secondary | ICD-10-CM | POA: Diagnosis not present

## 2023-08-11 DIAGNOSIS — I959 Hypotension, unspecified: Secondary | ICD-10-CM | POA: Diagnosis not present

## 2023-08-11 DIAGNOSIS — I509 Heart failure, unspecified: Secondary | ICD-10-CM | POA: Diagnosis not present

## 2023-08-11 DIAGNOSIS — Z7401 Bed confinement status: Secondary | ICD-10-CM | POA: Diagnosis not present

## 2023-08-11 DIAGNOSIS — R0902 Hypoxemia: Secondary | ICD-10-CM | POA: Diagnosis not present

## 2023-08-11 DIAGNOSIS — R001 Bradycardia, unspecified: Secondary | ICD-10-CM | POA: Diagnosis not present

## 2023-08-11 DIAGNOSIS — R0989 Other specified symptoms and signs involving the circulatory and respiratory systems: Secondary | ICD-10-CM | POA: Diagnosis not present

## 2023-08-11 DIAGNOSIS — R531 Weakness: Secondary | ICD-10-CM | POA: Diagnosis not present

## 2023-08-11 DIAGNOSIS — N281 Cyst of kidney, acquired: Secondary | ICD-10-CM | POA: Diagnosis not present

## 2023-08-11 DIAGNOSIS — R1110 Vomiting, unspecified: Secondary | ICD-10-CM

## 2023-08-11 DIAGNOSIS — Z743 Need for continuous supervision: Secondary | ICD-10-CM | POA: Diagnosis not present

## 2023-08-11 DIAGNOSIS — K4020 Bilateral inguinal hernia, without obstruction or gangrene, not specified as recurrent: Secondary | ICD-10-CM | POA: Diagnosis not present

## 2023-08-11 DIAGNOSIS — R410 Disorientation, unspecified: Secondary | ICD-10-CM | POA: Insufficient documentation

## 2023-08-11 DIAGNOSIS — R0689 Other abnormalities of breathing: Secondary | ICD-10-CM | POA: Diagnosis not present

## 2023-08-11 DIAGNOSIS — Z96612 Presence of left artificial shoulder joint: Secondary | ICD-10-CM | POA: Diagnosis not present

## 2023-08-11 DIAGNOSIS — R109 Unspecified abdominal pain: Secondary | ICD-10-CM | POA: Diagnosis not present

## 2023-08-11 LAB — LIPASE, BLOOD: Lipase: 27 U/L (ref 11–51)

## 2023-08-11 LAB — CBC WITH DIFFERENTIAL/PLATELET
Abs Immature Granulocytes: 0.04 10*3/uL (ref 0.00–0.07)
Basophils Absolute: 0 10*3/uL (ref 0.0–0.1)
Basophils Relative: 0 %
Eosinophils Absolute: 0.1 10*3/uL (ref 0.0–0.5)
Eosinophils Relative: 1 %
HCT: 40.6 % (ref 39.0–52.0)
Hemoglobin: 13.3 g/dL (ref 13.0–17.0)
Immature Granulocytes: 1 %
Lymphocytes Relative: 3 %
Lymphs Abs: 0.2 10*3/uL — ABNORMAL LOW (ref 0.7–4.0)
MCH: 30.7 pg (ref 26.0–34.0)
MCHC: 32.8 g/dL (ref 30.0–36.0)
MCV: 93.8 fL (ref 80.0–100.0)
Monocytes Absolute: 0.6 10*3/uL (ref 0.1–1.0)
Monocytes Relative: 7 %
Neutro Abs: 7.8 10*3/uL — ABNORMAL HIGH (ref 1.7–7.7)
Neutrophils Relative %: 88 %
Platelets: 146 10*3/uL — ABNORMAL LOW (ref 150–400)
RBC: 4.33 MIL/uL (ref 4.22–5.81)
RDW: 13.3 % (ref 11.5–15.5)
WBC: 8.7 10*3/uL (ref 4.0–10.5)
nRBC: 0 % (ref 0.0–0.2)

## 2023-08-11 LAB — URINALYSIS, W/ REFLEX TO CULTURE (INFECTION SUSPECTED)
Bacteria, UA: NONE SEEN
Bilirubin Urine: NEGATIVE
Glucose, UA: NEGATIVE mg/dL
Hgb urine dipstick: NEGATIVE
Ketones, ur: NEGATIVE mg/dL
Leukocytes,Ua: NEGATIVE
Nitrite: NEGATIVE
Protein, ur: NEGATIVE mg/dL
Specific Gravity, Urine: 1.045 — ABNORMAL HIGH (ref 1.005–1.030)
pH: 7 (ref 5.0–8.0)

## 2023-08-11 LAB — TROPONIN I (HIGH SENSITIVITY): Troponin I (High Sensitivity): 4 ng/L (ref <18)

## 2023-08-11 LAB — RESP PANEL BY RT-PCR (RSV, FLU A&B, COVID)  RVPGX2
Influenza A by PCR: NEGATIVE
Influenza B by PCR: NEGATIVE
Resp Syncytial Virus by PCR: NEGATIVE
SARS Coronavirus 2 by RT PCR: NEGATIVE

## 2023-08-11 LAB — BASIC METABOLIC PANEL
Anion gap: 10 (ref 5–15)
BUN: 21 mg/dL (ref 8–23)
CO2: 21 mmol/L — ABNORMAL LOW (ref 22–32)
Calcium: 8.6 mg/dL — ABNORMAL LOW (ref 8.9–10.3)
Chloride: 105 mmol/L (ref 98–111)
Creatinine, Ser: 0.57 mg/dL — ABNORMAL LOW (ref 0.61–1.24)
GFR, Estimated: >60 mL/min (ref >60)
Glucose, Bld: 115 mg/dL — ABNORMAL HIGH (ref 70–99)
Potassium: 3.4 mmol/L — ABNORMAL LOW (ref 3.5–5.1)
Sodium: 136 mmol/L (ref 135–145)

## 2023-08-11 LAB — HEPATIC FUNCTION PANEL
ALT: 15 U/L (ref 0–44)
AST: 19 U/L (ref 15–41)
Albumin: 3.5 g/dL (ref 3.5–5.0)
Alkaline Phosphatase: 71 U/L (ref 38–126)
Bilirubin, Direct: 0.2 mg/dL (ref 0.0–0.2)
Indirect Bilirubin: 1.2 mg/dL — ABNORMAL HIGH (ref 0.3–0.9)
Total Bilirubin: 1.4 mg/dL — ABNORMAL HIGH (ref <1.2)
Total Protein: 6.1 g/dL — ABNORMAL LOW (ref 6.5–8.1)

## 2023-08-11 LAB — I-STAT CHEM 8, ED
BUN: 20 mg/dL (ref 8–23)
Calcium, Ion: 1.13 mmol/L — ABNORMAL LOW (ref 1.15–1.40)
Chloride: 106 mmol/L (ref 98–111)
Creatinine, Ser: 0.8 mg/dL (ref 0.61–1.24)
Glucose, Bld: 112 mg/dL — ABNORMAL HIGH (ref 70–99)
HCT: 40 % (ref 39.0–52.0)
Hemoglobin: 13.6 g/dL (ref 13.0–17.0)
Potassium: 3.6 mmol/L (ref 3.5–5.1)
Sodium: 141 mmol/L (ref 135–145)
TCO2: 23 mmol/L (ref 22–32)

## 2023-08-11 LAB — I-STAT CG4 LACTIC ACID, ED: Lactic Acid, Venous: 1.9 mmol/L (ref 0.5–1.9)

## 2023-08-11 LAB — MAGNESIUM: Magnesium: 2 mg/dL (ref 1.7–2.4)

## 2023-08-11 MED ORDER — LORAZEPAM 1 MG PO TABS
1.0000 mg | ORAL_TABLET | Freq: Once | ORAL | Status: AC
  Administered 2023-08-11: 1 mg via ORAL
  Filled 2023-08-11: qty 1

## 2023-08-11 MED ORDER — LACTATED RINGERS IV BOLUS
500.0000 mL | Freq: Once | INTRAVENOUS | Status: AC
  Administered 2023-08-11: 500 mL via INTRAVENOUS

## 2023-08-11 MED ORDER — IOHEXOL 300 MG/ML  SOLN
100.0000 mL | Freq: Once | INTRAMUSCULAR | Status: AC | PRN
Start: 2023-08-11 — End: 2023-08-11
  Administered 2023-08-11: 100 mL via INTRAVENOUS

## 2023-08-11 MED ORDER — POTASSIUM CHLORIDE 20 MEQ PO PACK
40.0000 meq | PACK | Freq: Once | ORAL | Status: AC
  Administered 2023-08-11: 40 meq via ORAL
  Filled 2023-08-11: qty 2

## 2023-08-11 NOTE — ED Notes (Signed)
Asked pt for urine but is unable to go at this moment. Will check again in a few.

## 2023-08-11 NOTE — ED Notes (Signed)
[facility] called at this time. Stated it should be less than an hour to come get him. Will notify family at bedside.

## 2023-08-11 NOTE — ED Triage Notes (Signed)
Patient BIB EMS from [facility] for abdominal pain, nausea, and vomiting x 2 hours. Patient has dementia and is unable to describe symptoms further.

## 2023-08-11 NOTE — ED Provider Notes (Signed)
[place] EMERGENCY DEPARTMENT AT [facility] Provider Note   CSN: [id number] Arrival date & time: 08/11/23  1517     History  Chief Complaint  Patient presents with   Abdominal Pain    WORLEY KERSCHNER is a 82 y.o. male.  HPI Patient presents for abdominal pain, nausea, vomiting.  Medical history includes CHF, BPH, anemia, GERD, HLD, HTN, myasthenia gravis, arthritis, sleep apnea, dementia.  He arrives via EMS from nursing facility.  Initial history is provided by EMS.  Patient reportedly had onset of nausea and vomiting 2 hours prior to arrival in the ED.  He was reportedly in his normal state of health yesterday.  He is currently at his mental baseline.  Due to his dementia, history from patient is limited.  He currently denies abdominal pain or any other areas of discomfort.  He did receive Zofran prior to arrival with EMS.  He denies any current nausea.    Home Medications Prior to Admission medications   Medication Sig Start Date End Date Taking? Authorizing Provider  ARIPiprazole (ABILIFY) 5 MG tablet Take 1 tablet (5 mg total) by mouth daily. 12/16/22   [name], Georgina Quint, MD  Cholecalciferol (VITAMIN D3) 2000 units TABS Take 2,000 Units by mouth at bedtime.    [provider]  Coenzyme Q10 (CO Q-10 PO) Take 1 tablet by mouth at bedtime.    [provider]  Cyanocobalamin (VITAMIN B-12) 1000 MCG SUBL Place 1 tablet (1,000 mcg total) under the tongue daily. Patient taking differently: Place 1,000 mcg under the tongue at bedtime. 02/21/14   [name], Georgina Quint, MD  donepezil (ARICEPT) 10 MG tablet Take 1 tablet by mouth at bedtime 01/13/23   [name], Georgina Quint, MD  famotidine (PEPCID) 40 MG tablet Take 1 tablet (40 mg total) by mouth daily. 04/13/23   [name], Georgina Quint, MD  finasteride (PROSCAR) 5 MG tablet Take 1 tablet by mouth every day 04/20/23   [name], Georgina Quint, MD  furosemide (LASIX) 20 MG tablet Take 1 or 2 tablets by mouth every  evening 09/22/22   [name], Georgina Quint, MD  LORazepam (ATIVAN) 1 MG tablet Take 1 tablet (1 mg total) by mouth 2 (two) times daily as needed for anxiety. 06/09/22   [name], Georgina Quint, MD  memantine (NAMENDA) 10 MG tablet Take 1 tablet by mouth twice daily 06/23/23   [name], Georgina Quint, MD  mycophenolate (CELLCEPT) 500 MG tablet Take 1,000 mg by mouth 2 (two) times daily.    [provider]  potassium chloride (KLOR-CON) 10 MEQ tablet Take 1 tablet by mouth twice daily 06/12/23   [name], Georgina Quint, MD  pyridostigmine (MESTINON) 60 MG tablet One in AM , one at lunch, 2 at bedtime. Patient taking differently: Take 60 mg by mouth 2 (two) times daily. 04/12/18   Dohmeier, Porfirio Mylar, MD  sertraline (ZOLOFT) 100 MG tablet Take 1 tablet by mouth every morning 05/24/23   [name], Georgina Quint, MD  tamsulosin (FLOMAX) 0.4 MG CAPS capsule Take 2 capsules by mouth every day 06/24/23   [name], Georgina Quint, MD  vitamin C (ASCORBIC ACID) 500 MG tablet Take 500 mg by mouth daily.    [provider]      Allergies    Ferumoxytol and Doxycycline    Review of Systems   Review of Systems  Unable to perform ROS: Dementia  Gastrointestinal:  Positive for abdominal pain, nausea and vomiting.    Physical Exam Updated Vital Signs BP 115/60  Pulse 62   Temp 97.7 F (36.5 C) (Rectal)   Resp 15   Ht 6' (1.829 m)   Wt 112.9 kg   SpO2 92%   BMI 33.76 kg/m  Physical Exam Vitals and nursing note reviewed.  Constitutional:      General: He is not in acute distress.    Appearance: Normal appearance. He is well-developed. He is not ill-appearing, toxic-appearing or diaphoretic.  HENT:     Head: Normocephalic and atraumatic.     Right Ear: External ear normal.     Left Ear: External ear normal.     Nose: Nose normal.     Mouth/Throat:     Mouth: Mucous membranes are moist.  Eyes:     Extraocular Movements: Extraocular movements intact.     Conjunctiva/sclera: Conjunctivae normal.   Cardiovascular:     Rate and Rhythm: Normal rate and regular rhythm.     Heart sounds: No murmur heard. Pulmonary:     Effort: Pulmonary effort is normal. No respiratory distress.     Breath sounds: Normal breath sounds. No wheezing or rales.  Chest:     Chest wall: No tenderness.  Abdominal:     General: There is no distension.     Palpations: Abdomen is soft.     Tenderness: There is no abdominal tenderness.  Musculoskeletal:        General: No swelling or deformity.     Cervical back: Normal range of motion and neck supple.     Right lower leg: No edema.     Left lower leg: No edema.  Skin:    General: Skin is warm and dry.     Capillary Refill: Capillary refill takes less than 2 seconds.     Coloration: Skin is not jaundiced or pale.  Neurological:     General: No focal deficit present.     Mental Status: He is alert. Mental status is at baseline. He is disoriented.  Psychiatric:        Mood and Affect: Mood normal.        Behavior: Behavior normal.     ED Results / Procedures / Treatments   Labs (all labs ordered are listed, but only abnormal results are displayed) Labs Reviewed  CBC WITH DIFFERENTIAL/PLATELET - Abnormal; Notable for the following components:      Result Value   Platelets 146 (*)    Neutro Abs 7.8 (*)    Lymphs Abs 0.2 (*)    All other components within normal limits  BASIC METABOLIC PANEL - Abnormal; Notable for the following components:   Potassium 3.4 (*)    CO2 21 (*)    Glucose, Bld 115 (*)    Creatinine, Ser 0.57 (*)    Calcium 8.6 (*)    All other components within normal limits  HEPATIC FUNCTION PANEL - Abnormal; Notable for the following components:   Total Protein 6.1 (*)    Total Bilirubin 1.4 (*)    Indirect Bilirubin 1.2 (*)    All other components within normal limits  I-STAT CHEM 8, ED - Abnormal; Notable for the following components:   Glucose, Bld 112 (*)    Calcium, Ion 1.13 (*)    All other components within normal  limits  RESP PANEL BY RT-PCR (RSV, FLU A&B, COVID)  RVPGX2  LIPASE, BLOOD  MAGNESIUM  URINALYSIS, W/ REFLEX TO CULTURE (INFECTION SUSPECTED)  I-STAT CG4 LACTIC ACID, ED  TROPONIN I (HIGH SENSITIVITY)  TROPONIN I (HIGH SENSITIVITY)  EKG None  Radiology CT ABDOMEN PELVIS W CONTRAST Result Date: 08/11/2023 CLINICAL DATA:  Abdominal pain, acute, nonlocalized. EXAM: CT ABDOMEN AND PELVIS WITH CONTRAST TECHNIQUE: Multidetector CT imaging of the abdomen and pelvis was performed using the standard protocol following bolus administration of intravenous contrast. RADIATION DOSE REDUCTION: This exam was performed according to the departmental dose-optimization program which includes automated exposure control, adjustment of the mA and/or kV according to patient size and/or use of iterative reconstruction technique. CONTRAST:  OMNIPAQUE IOHEXOL 300 MG/ML  SOLN COMPARISON:  CT scan abdomen and pelvis from 03/06/2014. FINDINGS: Lower chest: Evaluation of lungs is limited due to patient's motion during data acquisition. There are atelectatic changes at the lung bases. No pneumonia, lung collapse or pleural effusion. The heart is normal in size. No pericardial effusion. Hepatobiliary: The liver is normal in size. Non-cirrhotic configuration. No suspicious mass. These is mild diffuse hepatic steatosis. No intrahepatic or extrahepatic bile duct dilation. No calcified gallstones. Normal gallbladder wall thickness. No pericholecystic inflammatory changes. Pancreas: Unremarkable. No pancreatic ductal dilatation or surrounding inflammatory changes. Spleen: Within normal limits. No focal lesion. Adrenals/Urinary Tract: Adrenal glands are unremarkable. No suspicious renal mass. There is a partially exophytic 1.7 x 2.4 cm cyst arising from the left kidney upper pole, medially. There is also a completely exophytic 2.3 x 3.2 cm cyst arising from the left kidney lower pole. No hydronephrosis. No renal or ureteric  calculi. Unremarkable urinary bladder. Stomach/Bowel: No disproportionate dilation of the small or large bowel loops. No evidence of abnormal bowel wall thickening or inflammatory changes. The appendix is unremarkable. Vascular/Lymphatic: No ascites or pneumoperitoneum. No abdominal or pelvic lymphadenopathy, by size criteria. No aneurysmal dilation of the major abdominal arteries. There are mild peripheral atherosclerotic vascular calcifications of the aorta and its major branches. Reproductive: Normal size prostate. Symmetric seminal vesicles. Other: There are fat containing umbilical and bilateral inguinal hernias. The soft tissues and abdominal wall are otherwise unremarkable. Musculoskeletal: No suspicious osseous lesions. There are moderate multilevel degenerative changes in the visualized spine. Note is made of posterior spinal fixation of L3 through S1. IMPRESSION: 1. Examination is limited due to patient's motion during data acquisition. 2. No acute inflammatory process identified within the abdomen or pelvis. No bowel obstruction. 3. Multiple other nonacute observations, as described above. Electronically Signed   By: Jules Schick M.D.   On: 08/11/2023 10:37   DG Chest Portable 1 View Result Date: 08/11/2023 CLINICAL DATA:  Abdominal pain.  Nausea and vomiting. EXAM: PORTABLE CHEST 1 VIEW COMPARISON:  07/06/2023. FINDINGS: Low lung volume. There are worsening diffuse increased interstitial markings, which are nonspecific and may be exaggerated by low lung volume. However, correlate clinically to exclude underlying atypical pneumonia. There are atelectatic changes at the left lung base. Note is made of markedly elevated left hemidiaphragm, unchanged. Bilateral lung fields are otherwise clear. Bilateral costophrenic angles are clear. Stable cardio-mediastinal silhouette. Left reverse shoulder arthroplasty noted. No acute osseous abnormalities. The soft tissues are within normal limits. IMPRESSION:  *Worsening diffusely increased interstitial markings, which is nonspecific and may be due to underlying atypical pneumonia. Correlate clinically. Electronically Signed   By: Jules Schick M.D.   On: 08/11/2023 10:30    Procedures Procedures    Medications Ordered in ED Medications  LORazepam (ATIVAN) tablet 1 mg (has no administration in time range)  potassium chloride (KLOR-CON) packet 40 mEq (has no administration in time range)  lactated ringers bolus 500 mL (0 mLs Intravenous Stopped 08/11/23 0845)  iohexol (OMNIPAQUE)  300 MG/ML solution 100 mL (100 mLs Intravenous Contrast Given 08/11/23 0935)    ED Course/ Medical Decision Making/ A&P                                 Medical Decision Making Amount and/or Complexity of Data Reviewed Labs: ordered. Radiology: ordered.  Risk Prescription drug management.   This patient presents to the ED for concern of abdominal pain and emesis, this involves an extensive number of treatment options, and is a complaint that carries with it a high risk of complications and morbidity.  The differential diagnosis includes gastritis, GERD, enteritis, intra-abdominal infection, AAA   Co morbidities that complicate the patient evaluation  CHF, BPH, anemia, GERD, HLD, HTN, myasthenia gravis, arthritis, sleep apnea, dementia   Additional history obtained:  Additional history obtained from EMS, patient's wife External records from outside source obtained and reviewed including EMR   Lab Tests:  I Ordered, and personally interpreted labs.  The pertinent results include: Normal kidney function, slight hypokalemia with otherwise normal electrolytes, no leukocytosis   Imaging Studies ordered:  I ordered imaging studies including chest x-ray, CT of abdomen and pelvis I independently visualized and interpreted imaging which showed increased interstitial lung markings when compared to prior studies, no other acute findings I agree with the  radiologist interpretation   Cardiac Monitoring: / EKG:  The patient was maintained on a cardiac monitor.  I personally viewed and interpreted the cardiac monitored which showed an underlying rhythm of: Sinus rhythm  Problem List / ED Course / Critical interventions / Medication management  Patient with history of dementia, presenting from nursing facility via EMS for acute onset of abdominal pain, nausea, and vomiting.  This reportedly started 2 hours prior to arrival.  EMS noted initial blood pressures of 100s over 60s.  This improved to the 130s SBP.  On arrival, patient is normotensive and afebrile, including on check of rectal temperature.  He has no appreciable wounds present.  He is overall well-appearing.  He denies any current complaints.  Abdomen is soft and nontender.  His breathing is unlabored.  Lungs are clear to auscultation.  IV fluids were ordered for hydration.  Workup was initiated.  Lab work was reassuring.  Patient's vital signs remain normal.  He remained asymptomatic while in the ED.  On his chest x-ray, there was some concern about increased interstitial markings.  On CT scan of abdomen and pelvis, lower lungs appear to be atelectatic.  There were no intra-abdominal findings to explain his recent symptoms.  His wife arrived in the ED and joined at the bedside.  She states that she lives 10 minutes away from the nursing facility and sees him every day.  He has not had any recent symptoms or complaints prior to today.  Given his absence of any respiratory symptoms, I have low suspicion of pneumonia.  Patient's wife states that he will get nauseous at times and that it is not unusual for him.  She is in favor of discharge back to facility.  While in the ED, he did have some agitation.  He does get as needed Ativan at his facility.  Dose was ordered.  Patient was discharged in stable condition. I ordered medication including IV fluids for hydration; Ativan for agitation; potassium  chloride for hypokalemia Reevaluation of the patient after these medicines showed that the patient improved I have reviewed the patients home medicines and have  made adjustments as needed   Social Determinants of Health:  Has dementia and resides in nursing facility        Final Clinical Impression(s) / ED Diagnoses Final diagnoses:  Vomiting, unspecified vomiting type, unspecified whether nausea present    Rx / DC Orders ED Discharge Orders     None         Gloris Manchester, MD 08/11/23 1105

## 2023-08-11 NOTE — Discharge Instructions (Signed)
Test results today were reassuring.  Continue home medications as prescribed.  Return to the emergency department for any new or worsening symptoms of concern.

## 2023-08-12 DIAGNOSIS — L89152 Pressure ulcer of sacral region, stage 2: Secondary | ICD-10-CM | POA: Diagnosis not present

## 2023-08-12 DIAGNOSIS — I10 Essential (primary) hypertension: Secondary | ICD-10-CM | POA: Diagnosis not present

## 2023-08-12 DIAGNOSIS — G7000 Myasthenia gravis without (acute) exacerbation: Secondary | ICD-10-CM | POA: Diagnosis not present

## 2023-08-12 DIAGNOSIS — Z79899 Other long term (current) drug therapy: Secondary | ICD-10-CM | POA: Diagnosis not present

## 2023-08-12 DIAGNOSIS — G309 Alzheimer's disease, unspecified: Secondary | ICD-10-CM | POA: Diagnosis not present

## 2023-08-12 DIAGNOSIS — L89312 Pressure ulcer of right buttock, stage 2: Secondary | ICD-10-CM | POA: Diagnosis not present

## 2023-08-13 ENCOUNTER — Emergency Department (HOSPITAL_COMMUNITY)
Admission: EM | Admit: 2023-08-13 | Discharge: 2023-08-14 | Disposition: A | Discharge status: Skilled Nursing Facility | Payer: Medicare Other | Attending: Emergency Medicine | Admitting: Emergency Medicine

## 2023-08-13 ENCOUNTER — Other Ambulatory Visit: Payer: Self-pay

## 2023-08-13 ENCOUNTER — Emergency Department (HOSPITAL_COMMUNITY): Payer: Medicare Other

## 2023-08-13 ENCOUNTER — Encounter (HOSPITAL_COMMUNITY): Payer: Self-pay | Admitting: Emergency Medicine

## 2023-08-13 DIAGNOSIS — R001 Bradycardia, unspecified: Secondary | ICD-10-CM | POA: Insufficient documentation

## 2023-08-13 DIAGNOSIS — R4182 Altered mental status, unspecified: Secondary | ICD-10-CM | POA: Diagnosis present

## 2023-08-13 DIAGNOSIS — L89312 Pressure ulcer of right buttock, stage 2: Secondary | ICD-10-CM | POA: Diagnosis not present

## 2023-08-13 DIAGNOSIS — F0280 Dementia in other diseases classified elsewhere without behavioral disturbance: Secondary | ICD-10-CM | POA: Diagnosis not present

## 2023-08-13 DIAGNOSIS — G309 Alzheimer's disease, unspecified: Secondary | ICD-10-CM | POA: Insufficient documentation

## 2023-08-13 DIAGNOSIS — Z79899 Other long term (current) drug therapy: Secondary | ICD-10-CM | POA: Diagnosis not present

## 2023-08-13 DIAGNOSIS — F03918 Unspecified dementia, unspecified severity, with other behavioral disturbance: Secondary | ICD-10-CM | POA: Diagnosis present

## 2023-08-13 DIAGNOSIS — R4689 Other symptoms and signs involving appearance and behavior: Secondary | ICD-10-CM

## 2023-08-13 DIAGNOSIS — W19XXXA Unspecified fall, initial encounter: Secondary | ICD-10-CM | POA: Diagnosis not present

## 2023-08-13 DIAGNOSIS — I959 Hypotension, unspecified: Secondary | ICD-10-CM | POA: Diagnosis not present

## 2023-08-13 DIAGNOSIS — R9389 Abnormal findings on diagnostic imaging of other specified body structures: Secondary | ICD-10-CM | POA: Diagnosis not present

## 2023-08-13 DIAGNOSIS — G7000 Myasthenia gravis without (acute) exacerbation: Secondary | ICD-10-CM | POA: Diagnosis not present

## 2023-08-13 DIAGNOSIS — L89152 Pressure ulcer of sacral region, stage 2: Secondary | ICD-10-CM | POA: Diagnosis not present

## 2023-08-13 LAB — CBC WITH DIFFERENTIAL/PLATELET
Abs Immature Granulocytes: 0.01 10*3/uL (ref 0.00–0.07)
Basophils Absolute: 0 10*3/uL (ref 0.0–0.1)
Basophils Relative: 0 %
Eosinophils Absolute: 0.2 10*3/uL (ref 0.0–0.5)
Eosinophils Relative: 4 %
HCT: 41.3 % (ref 39.0–52.0)
Hemoglobin: 13.1 g/dL (ref 13.0–17.0)
Immature Granulocytes: 0 %
Lymphocytes Relative: 20 %
Lymphs Abs: 0.9 10*3/uL (ref 0.7–4.0)
MCH: 29.9 pg (ref 26.0–34.0)
MCHC: 31.7 g/dL (ref 30.0–36.0)
MCV: 94.3 fL (ref 80.0–100.0)
Monocytes Absolute: 0.8 10*3/uL (ref 0.1–1.0)
Monocytes Relative: 18 %
Neutro Abs: 2.6 10*3/uL (ref 1.7–7.7)
Neutrophils Relative %: 58 %
Platelets: 156 10*3/uL (ref 150–400)
RBC: 4.38 MIL/uL (ref 4.22–5.81)
RDW: 13.5 % (ref 11.5–15.5)
WBC: 4.5 10*3/uL (ref 4.0–10.5)
nRBC: 0 % (ref 0.0–0.2)

## 2023-08-13 LAB — COMPREHENSIVE METABOLIC PANEL
ALT: 17 U/L (ref 0–44)
AST: 18 U/L (ref 15–41)
Albumin: 3.2 g/dL — ABNORMAL LOW (ref 3.5–5.0)
Alkaline Phosphatase: 70 U/L (ref 38–126)
Anion gap: 12 (ref 5–15)
BUN: 18 mg/dL (ref 8–23)
CO2: 21 mmol/L — ABNORMAL LOW (ref 22–32)
Calcium: 9 mg/dL (ref 8.9–10.3)
Chloride: 107 mmol/L (ref 98–111)
Creatinine, Ser: 0.74 mg/dL (ref 0.61–1.24)
GFR, Estimated: >60 mL/min (ref >60)
Glucose, Bld: 111 mg/dL — ABNORMAL HIGH (ref 70–99)
Potassium: 3.7 mmol/L (ref 3.5–5.1)
Sodium: 140 mmol/L (ref 135–145)
Total Bilirubin: 1.2 mg/dL — ABNORMAL HIGH (ref <1.2)
Total Protein: 5.6 g/dL — ABNORMAL LOW (ref 6.5–8.1)

## 2023-08-13 LAB — TROPONIN I (HIGH SENSITIVITY)
Troponin I (High Sensitivity): 4 ng/L (ref <18)
Troponin I (High Sensitivity): 4 ng/L (ref <18)

## 2023-08-13 LAB — ETHANOL: Alcohol, Ethyl (B): <10 mg/dL (ref <10)

## 2023-08-13 NOTE — ED Provider Notes (Signed)
Patient brought in by way of EMS and psychiatry was consulted to address evaluation for altered mental status.  Patient currently being medically processed for clearance, will defer seeing the patient at this time until medical clearance is obtained.

## 2023-08-13 NOTE — ED Notes (Signed)
Pt found standing up at bedside with street clothes back on stating he needed to use the restroom. Pt ambulated to restroom with RN. Upon checking on in restroom, pt had removed IV and was washing hands. Pt redirected to treatment room and placed back on monitor. Dr. Silverio Lay notified.

## 2023-08-13 NOTE — ED Provider Notes (Signed)
[place] EMERGENCY DEPARTMENT AT [facility] Provider Note   CSN: [id number] Arrival date & time: 08/13/23  1721     History  Chief Complaint  Patient presents with   Aggressive Behavior   Bradycardia    [name] is a 82 y.o. male history of dementia, here presenting with altered mental status.  Patient is from [facility].  Patient was noted to be combative and altered.  Patient was noted to be bradycardic for EMS with a heart rate in the 40s.  Patient was given 1 mg atropine and 500 cc of normal saline bolus.  Patient was seen here last week for altered mental status and was sent back to the facility.  Patient also was seen here 2 days ago for abdominal pain and had unremarkable workup in the ER.  The history is provided by the patient and the EMS personnel.       Home Medications Prior to Admission medications   Medication Sig Start Date End Date Taking? Authorizing Provider  ARIPiprazole (ABILIFY) 5 MG tablet Take 1 tablet (5 mg total) by mouth daily. 12/16/22   [name], Georgina Quint, MD  Cholecalciferol (VITAMIN D3) 2000 units TABS Take 2,000 Units by mouth at bedtime.    [provider]  Coenzyme Q10 (CO Q-10 PO) Take 1 tablet by mouth at bedtime.    [provider]  Cyanocobalamin (VITAMIN B-12) 1000 MCG SUBL Place 1 tablet (1,000 mcg total) under the tongue daily. Patient taking differently: Place 1,000 mcg under the tongue at bedtime. 02/21/14   [name], Georgina Quint, MD  donepezil (ARICEPT) 10 MG tablet Take 1 tablet by mouth at bedtime 01/13/23   [name], Georgina Quint, MD  famotidine (PEPCID) 40 MG tablet Take 1 tablet (40 mg total) by mouth daily. 04/13/23   [name], Georgina Quint, MD  finasteride (PROSCAR) 5 MG tablet Take 1 tablet by mouth every day 04/20/23   [name], Georgina Quint, MD  furosemide (LASIX) 20 MG tablet Take 1 or 2 tablets by mouth every evening 09/22/22   [name], Georgina Quint, MD  LORazepam (ATIVAN) 1 MG tablet Take 1  tablet (1 mg total) by mouth 2 (two) times daily as needed for anxiety. 06/09/22   [name], Georgina Quint, MD  memantine (NAMENDA) 10 MG tablet Take 1 tablet by mouth twice daily 06/23/23   [name], Georgina Quint, MD  mycophenolate (CELLCEPT) 500 MG tablet Take 1,000 mg by mouth 2 (two) times daily.    [provider]  potassium chloride (KLOR-CON) 10 MEQ tablet Take 1 tablet by mouth twice daily 06/12/23   [name], Georgina Quint, MD  pyridostigmine (MESTINON) 60 MG tablet One in AM , one at lunch, 2 at bedtime. Patient taking differently: Take 60 mg by mouth 2 (two) times daily. 04/12/18   Dohmeier, Porfirio Mylar, MD  sertraline (ZOLOFT) 100 MG tablet Take 1 tablet by mouth every morning 05/24/23   [name], Georgina Quint, MD  tamsulosin (FLOMAX) 0.4 MG CAPS capsule Take 2 capsules by mouth every day 06/24/23   [name], Georgina Quint, MD  vitamin C (ASCORBIC ACID) 500 MG tablet Take 500 mg by mouth daily.    [provider]      Allergies    Ferumoxytol and Doxycycline    Review of Systems   Review of Systems  Psychiatric/Behavioral:  Positive for agitation.   All other systems reviewed and are negative.   Physical Exam Updated Vital Signs BP (!) 144/74   Pulse 62   Temp (!)  97.5 F (36.4 C)   Resp 15   SpO2 100%  Physical Exam Vitals and nursing note reviewed.  Constitutional:      Comments: Demented  HENT:     Head: Normocephalic.     Nose: Nose normal.     Mouth/Throat:     Mouth: Mucous membranes are moist.  Eyes:     Extraocular Movements: Extraocular movements intact.     Pupils: Pupils are equal, round, and reactive to light.  Cardiovascular:     Rate and Rhythm: Normal rate and regular rhythm.     Pulses: Normal pulses.     Heart sounds: Normal heart sounds.  Pulmonary:     Effort: Pulmonary effort is normal.     Breath sounds: Normal breath sounds.  Abdominal:     General: Abdomen is flat.     Palpations: Abdomen is soft.  Musculoskeletal:        General:  Normal range of motion.     Cervical back: Normal range of motion and neck supple.  Skin:    General: Skin is warm.     Capillary Refill: Capillary refill takes less than 2 seconds.  Neurological:     General: No focal deficit present.     Mental Status: He is alert.     Comments: ANO x 1.  Patient is moving all extremities.  Patient is calm  Psychiatric:        Mood and Affect: Mood normal.     ED Results / Procedures / Treatments   Labs (all labs ordered are listed, but only abnormal results are displayed) Labs Reviewed  CBC WITH DIFFERENTIAL/PLATELET  COMPREHENSIVE METABOLIC PANEL  TROPONIN I (HIGH SENSITIVITY)    EKG None  Radiology No results found.  Procedures Procedures    Medications Ordered in ED Medications - No data to display  ED Course/ Medical Decision Making/ A&P                                 Medical Decision Making [name] is a 82 y.o. male with aggressive behavior and possible bradycardia.  Patient's heart rate is upper 50s to low 60s.  Patient is demented at baseline and does not appear to be agitated.  Plan to get psych clearance labs and consult TTS.  8:53 PM Labs unremarkable and CT head unremarkable.  Medically cleared for psych eval. reviewed patient's tracing in the ER and heart rates been in the 40s.  I do not think this is causing his altered mental status.  Patient is not on any blader blocker or calcium channel blocker.   Amount and/or Complexity of Data Reviewed Labs: ordered. Decision-making details documented in ED Course. Radiology: ordered and independent interpretation performed. Decision-making details documented in ED Course.   Final Clinical Impression(s) / ED Diagnoses Final diagnoses:  None    Rx / DC Orders ED Discharge Orders     None         Charlynne Pander, MD 08/13/23 2356

## 2023-08-13 NOTE — ED Triage Notes (Signed)
Pt BIB EMS from [facility] for combative behavior and bradycardia. Dementia at baseline. Sinus brady on monitor, HR 40s. Atropine 1 mg and 500 mL NS bolus given prior to arrival. Current HR 58. Pt needs constant redirection and occasionally gets loud with staff.

## 2023-08-14 DIAGNOSIS — R4182 Altered mental status, unspecified: Secondary | ICD-10-CM | POA: Diagnosis present

## 2023-08-14 DIAGNOSIS — F03918 Unspecified dementia, unspecified severity, with other behavioral disturbance: Secondary | ICD-10-CM | POA: Diagnosis present

## 2023-08-14 MED ORDER — MELATONIN 5 MG PO TABS: 10.0000 mg | ORAL_TABLET | Freq: Every day | ORAL | Status: DC

## 2023-08-14 MED ORDER — SERTRALINE HCL 100 MG PO TABS
100.0000 mg | ORAL_TABLET | Freq: Every day | ORAL | Status: DC
  Administered 2023-08-14: 100 mg via ORAL
  Filled 2023-08-14: qty 1

## 2023-08-14 MED ORDER — PYRIDOSTIGMINE BROMIDE 60 MG PO TABS
60.0000 mg | ORAL_TABLET | Freq: Two times a day (BID) | ORAL | Status: DC
  Administered 2023-08-14: 60 mg via ORAL
  Filled 2023-08-14 (×2): qty 1

## 2023-08-14 MED ORDER — POTASSIUM CHLORIDE CRYS ER 10 MEQ PO TBCR
10.0000 meq | EXTENDED_RELEASE_TABLET | Freq: Two times a day (BID) | ORAL | Status: DC
  Administered 2023-08-14: 10 meq via ORAL
  Filled 2023-08-14: qty 1

## 2023-08-14 MED ORDER — SERTRALINE HCL 100 MG PO TABS: 100.0000 mg | ORAL_TABLET | Freq: Every morning | ORAL | Status: DC

## 2023-08-14 MED ORDER — DONEPEZIL HCL 5 MG PO TABS: 10.0000 mg | ORAL_TABLET | Freq: Every day | ORAL | Status: DC

## 2023-08-14 MED ORDER — QUETIAPINE FUMARATE 25 MG PO TABS: 50.0000 mg | ORAL_TABLET | Freq: Every day | ORAL | Status: DC

## 2023-08-14 MED ORDER — MEMANTINE HCL 10 MG PO TABS: 10.0000 mg | ORAL_TABLET | Freq: Two times a day (BID) | ORAL | Status: DC

## 2023-08-14 MED ORDER — FUROSEMIDE 20 MG PO TABS
20.0000 mg | ORAL_TABLET | Freq: Every day | ORAL | Status: DC
  Administered 2023-08-14: 20 mg via ORAL
  Filled 2023-08-14: qty 1

## 2023-08-14 MED ORDER — MYCOPHENOLATE MOFETIL 250 MG PO CAPS
1000.0000 mg | ORAL_CAPSULE | Freq: Two times a day (BID) | ORAL | Status: DC
  Administered 2023-08-14: 1000 mg via ORAL
  Filled 2023-08-14 (×2): qty 4

## 2023-08-14 MED ORDER — SKIN PREP SPRAY MISC: 1.0000 | Status: DC

## 2023-08-14 MED ORDER — QUETIAPINE FUMARATE 50 MG PO TABS: 100.0000 mg | ORAL_TABLET | Freq: Every day | ORAL | 0 refills | Status: AC

## 2023-08-14 MED ORDER — QUETIAPINE FUMARATE 100 MG PO TABS
100.0000 mg | ORAL_TABLET | Freq: Every day | ORAL | Status: DC
Start: 2023-08-14 — End: 2023-08-14

## 2023-08-14 MED ORDER — FAMOTIDINE 20 MG PO TABS: 40.0000 mg | ORAL_TABLET | Freq: Every day | ORAL | Status: DC

## 2023-08-14 MED ORDER — FINASTERIDE 5 MG PO TABS
5.0000 mg | ORAL_TABLET | Freq: Every day | ORAL | Status: DC
  Administered 2023-08-14: 5 mg via ORAL
  Filled 2023-08-14: qty 1

## 2023-08-14 MED ORDER — TAMSULOSIN HCL 0.4 MG PO CAPS
0.4000 mg | ORAL_CAPSULE | Freq: Every day | ORAL | Status: DC
  Administered 2023-08-14: 0.4 mg via ORAL
  Filled 2023-08-14: qty 1

## 2023-08-14 MED ORDER — MEMANTINE HCL 10 MG PO TABS
10.0000 mg | ORAL_TABLET | Freq: Two times a day (BID) | ORAL | Status: DC
  Administered 2023-08-14: 10 mg via ORAL
  Filled 2023-08-14: qty 1

## 2023-08-14 MED ORDER — DONEPEZIL HCL 10 MG PO TABS: 10.0000 mg | ORAL_TABLET | Freq: Every day | ORAL | Status: DC

## 2023-08-14 NOTE — ED Notes (Signed)
Wife

## 2023-08-14 NOTE — ED Notes (Signed)
Pt medically cleared by Dr. Silverio Lay. Awaiting psych consult.

## 2023-08-14 NOTE — ED Notes (Signed)
Wife called and update given .  

## 2023-08-14 NOTE — BH Assessment (Signed)
Per Berna Spare, RN,pt is currently sleep and unable to be assessed at this time. TTS will be notified when pt is awake and alert for assessment.

## 2023-08-14 NOTE — Progress Notes (Addendum)
CSW attempted to reach staff at [facility] on various numbers without any success.  CSW spoke with patient's wife [name] who provided CSW with contact information for Kathrin Greathouse, director of clinical services [phone]).  CSW spoke with [place] at [facility] who states patient can return to the facility via [facility] and no report is needed.  CSW notified bedside RN of information.  Edwin Dada, MSW, LCSW Transitions of Care  Clinical Social Worker II [phone]

## 2023-08-14 NOTE — ED Notes (Signed)
Pt changed into burgundy scrubs, brief changed. Belongings labeled and in locker #1.

## 2023-08-14 NOTE — ED Notes (Signed)
Pt pretended to be sleeping in a chair and then jumped up and ran out the back door of the purple unit. ED staff and police officer escorted the pt back to his room. Pt then accepted a ginger ale. Pt's sitter is now positioned in front of the door to prevent escape.

## 2023-08-14 NOTE — ED Notes (Signed)
Safety sitter at bedside 

## 2023-08-14 NOTE — ED Provider Notes (Signed)
Patient has been cleared by psychiatry.  Reevaluated and resting comfortably.  They believe that his symptoms are due to his dementia.  They are recommending that he discontinue the Ativan since it could be causing him to become delirious.  At this low-dose do not feel that taper is warranted since withdrawals unlikely.  Will increase his Seroquel to 100 mg nightly based on their recommendations.  Social work is called his facility and he will be sent back to their memory care unit.   Rondel Baton, MD 08/14/23 [phone]

## 2023-08-14 NOTE — ED Notes (Signed)
Pt walked to bathroom and back with assistance

## 2023-08-14 NOTE — ED Notes (Signed)
Resting on stretcher at this time. calm

## 2023-08-14 NOTE — Discharge Instructions (Signed)
You are seen for your agitation.  Psychiatry evaluated you.  It is likely that your symptoms are from dementia.  They want you to stop taking her Ativan because it can cause you to be confused.  They want to increase your Seroquel nightly as well.

## 2023-08-14 NOTE — ED Notes (Signed)
Report called to [facility]  [phone]

## 2023-08-14 NOTE — ED Notes (Signed)
Patient laying on stretcher very confused  thinks he is 81 years old and at work. Tried to redirect however remains confused.

## 2023-08-14 NOTE — Consult Note (Signed)
[facility] Psychiatric Consult Initial  Patient Name: .KIRA [name]  MRN: [id number]  DOB: [birth year]  Consult Order details:  Orders (From admission, onward)     Start     Ordered   08/13/23 1749  CONSULT TO CALL ACT TEAM       Ordering Provider: Charlynne Pander, MD  Provider:  (Not yet assigned)  Question Answer Comment  Place call to: ACT   Reason for Consult Admit      08/13/23 1748             Mode of Visit: In person    Psychiatry Consult Evaluation  Service Date: August 14, 2023 LOS:  LOS: 0 days  Chief Complaint "Ears"  Primary Psychiatric Diagnoses  Alzheimer's disease  Assessment  [name] is a 82 y.o. male admitted: Presented to the EDfor 08/13/2023  5:21 PM for aggressive behaviors. He carries the psychiatric diagnoses of alzheimer's disease and has a past medical history of  HTN, CHF, CVA, OSA, dysphagia, GERD and myasthenia gravis.   His current presentation of behavioral disturbance is most consistent with dementia. He meets criteria for dementia based on clear decline in memory, and the documented steady progressive cognitive decline.  Current outpatient psychotropic medications include donepizil, namenda, zoloft, ativan and seroquel and historically he has had a positive response to these medications. He was compliant with medications prior to admission as evidenced by living in a facility where his medications are managed for him. On initial examination, patient is very hard of hearing.  He believes he is in his mid 20's, that he is in the hospital to have his ears fixed and that he is currently in [place]. He has no idea what the date is. He says he feels fine.  He has had no aggressive outbursts while in the emergency department. Please see plan below for detailed recommendations.   Diagnoses:  Active Hospital problems: Principal Problem:   Dementia with behavioral disturbance (HCC)    Plan   ## Psychiatric Medication  Recommendations:  Donepizil 10mg  Q HS Namenda 10mg  Q BID Zoloft 100mg  Q Day Seroquel 100mg  Q HS Stop the scheduled ativan  ## Medical Decision Making Capacity: Not specifically addressed in this encounter  ## Further Work-up:  -- U/A -- most recent EKG on 08/13/2023 had QtC of 435 -- Pertinent labwork reviewed earlier this admission includes: CBC, CMP, alcohol level   ## Disposition:-- There are no psychiatric contraindications to discharge at this time  ## Behavioral / Environmental: -Delirium Precautions: Delirium Interventions for Nursing and Staff: - RN to open blinds every AM. - To Bedside: Glasses, hearing aide, and pt's own shoes. Make available to patients. when possible and encourage use. - Encourage po fluids when appropriate, keep fluids within reach. - OOB to chair with meals. - Passive ROM exercises to all extremities with AM & PM care. - RN to assess orientation to person, time and place QAM and PRN. - Recommend extended visitation hours with familiar family/friends as feasible. - Staff to minimize disturbances at night. Turn off television when pt asleep or when not in use.    ## Safety and Observation Level:  - Based on my clinical evaluation, I estimate the patient to be at no risk of self harm in the current setting. - At this time, we recommend  routine. This decision is based on my review of the chart including patient's history and current presentation, interview of the patient, mental status examination, and consideration of  suicide risk including evaluating suicidal ideation, plan, intent, suicidal or self-harm behaviors, risk factors, and protective factors. This judgment is based on our ability to directly address suicide risk, implement suicide prevention strategies, and develop a safety plan while the patient is in the clinical setting. Please contact our team if there is a concern that risk level has changed.  CSSR Risk Category:C-SSRS RISK CATEGORY: No  Risk  Suicide Risk Assessment: Patient has following modifiable risk factors for suicide: None, which we are addressing by N/A. Patient has following non-modifiable or demographic risk factors for suicide: male gender Patient has the following protective factors against suicide: Access to outpatient mental health care and Supportive family  Thank you for this consult request. Recommendations have been communicated to the primary team.  We will sign off at this time.   Thomes Lolling, NP       History of Present Illness  Relevant Aspects of Hospital ED Course:  Admitted on 08/13/2023 for for aggressive behaviors. He carries the psychiatric diagnoses of alzheimer's disease and has a past medical history of  HTN, CHF, CVA, OSA, dysphagia, GERD and myasthenia gravis.   Patient Report:  On initial examination, patient is very hard of hearing.  He believes he is in his mid 20's, that he is in the hospital to have his ears fixed and that he is currently in [place]. He has no idea what the date is. He says he feels fine.  He has had no aggressive outbursts while in the emergency department.  Psych ROS:  Depression: denies Anxiety:  denies Mania (lifetime and current): denies Psychosis: (lifetime and current): denies   Review of Systems  HENT:  Positive for hearing loss.   Psychiatric/Behavioral:  Positive for memory loss.   All other systems reviewed and are negative.    Psychiatric and Social History  Psychiatric History:  Information collected from chart review  Prev Dx/Sx: Alzheimer's disease  Current Psych Provider: primary care is managing psych care Home Meds (current): denepezil, namenda, zoloft, ativan and seroquel  Previous Med Trials: Unknown Therapy: Not at this time  Social History:  Living Situation: living in a facility Spiritual Hx: unknown Access to weapons/lethal means: No   Substance History Alcohol: none  Tobacco: none Illicit drugs: none Prescription  drug abuse: none Rehab hx: Unknown  Exam Findings  Physical Exam:  Vital Signs:  Temp:  [97.4 F (36.3 C)-97.9 F (36.6 C)] 97.4 F (36.3 C) (12/20 0957) Pulse Rate:  [41-86] 86 (12/20 0957) Resp:  [14-18] 16 (12/20 0957) BP: (91-144)/(37-101) 117/101 (12/20 0957) SpO2:  [92 %-100 %] 99 % (12/20 0957) Blood pressure (!) 117/101, pulse 86, temperature (!) 97.4 F (36.3 C), temperature source Oral, resp. rate 16, SpO2 99%. There is no height or weight on file to calculate BMI.  Physical Exam HENT:     Ears:     Comments: Very hard of hearing  Eyes:     Pupils: Pupils are equal, round, and reactive to light.  Pulmonary:     Effort: Pulmonary effort is normal.  Skin:    General: Skin is dry.  Neurological:     Mental Status: He is alert. He is disoriented.     Mental Status Exam: General Appearance: Disheveled  Orientation:  Negative  Memory:  Negative  Concentration:  Concentration: Poor  Recall:  Negative  Attention  Poor  Eye Contact:  Good  Speech:  Clear and Coherent  Language:  Fair  Volume:  Normal  Mood: euthymic  Affect:  Congruent  Thought Process:  Disorganized  Thought Content:  Negative  Suicidal Thoughts:  No  Homicidal Thoughts:  No  Judgement:  Impaired  Insight:  Lacking  Psychomotor Activity:  Shuffling Gait  Akathisia:  No  Fund of Knowledge:  Poor      Assets:  Social Support  Cognition:  Impaired,  Severe  ADL's:  Intact  AIMS (if indicated):   N/A     Other History   These have been pulled in through the EMR, reviewed, and updated if appropriate.  Family History:  The patient's family history includes Alzheimer's disease in his brother and father; Aneurysm in his father; Autoimmune disease in his son; CAD in his brother; Cancer in his brother; Cancer (age of onset: 76) in his mother; Heart disease (age of onset: 67) in his mother; Heart disease (age of onset: 82) in his father; Stomach cancer in his brother.  Medical History: Past  Medical History:  Diagnosis Date   Anemia    Blood transfusion without reported diagnosis 02/23/2014   3 units for iron deficiency anemia. as a baby - whenhe had pneumonia   BPH (benign prostatic hyperplasia)    CTS (carpal tunnel syndrome)    better   Dementia (HCC)    Dyspnea    GERD (gastroesophageal reflux disease)    Hyperlipidemia    Hypertension    "THEY DIAGNOSED ME YEARS AGO BUT LATELY IVE ACTUALLY HAD LOW BLOOD PRESSURES "    Insomnia    LBP (low back pain)    Myasthenia gravis (HCC)    Osteoarthritis    Sleep apnea    no cpap    Surgical History: Past Surgical History:  Procedure Laterality Date   CARDIAC CATHETERIZATION N/A 08/15/2015   Procedure: Left Heart Cath and Coronary Angiography;  Surgeon: Runell Gess, MD;  Location: [facility] INVASIVE CV LAB;  Service: Cardiovascular;  Laterality: N/A;   CARPAL TUNNEL RELEASE Bilateral 1996, 2004   Dr Margretta Sidle thumb surgery.  more than once   COCHLEAR IMPLANT  04/10/2022   COLONOSCOPY  2005, 2012   diverticulosis.    ESOPHAGOGASTRODUODENOSCOPY N/A 02/23/2014   Procedure: ESOPHAGOGASTRODUODENOSCOPY (EGD);  Surgeon: Hilarie Fredrickson, MD;  Location[facility] ENDOSCOPY;  Service: Endoscopy;  Laterality: N/A;   EYE SURGERY  2012   both cataracts, Lasik   LEFT KNEE ARTHROSCOPY Left 07/16/2017    [facility]    LUMBAR FUSION     x 3   REVERSE SHOULDER ARTHROPLASTY Left 07/04/2016   Procedure: LEFT REVERSE SHOULDER ARTHROPLASTY;  Surgeon: Beverely Low, MD;  Location: [facility] OR;  Service: Orthopedics;  Laterality: Left;   TONSILLECTOMY  1946   TOTAL KNEE ARTHROPLASTY  2003   Right   TOTAL KNEE ARTHROPLASTY Left 09/25/2017   Procedure: LEFT TOTAL KNEE ARTHROPLASTY;  Surgeon: Eugenia Mcalpine, MD;  Location: [facility] ORS;  Service: Orthopedics;  Laterality: Left;     Medications:   Current Facility-Administered Medications:    donepezil (ARICEPT) tablet 10 mg, 10 mg, Oral, QHS, Rondel Baton, MD   famotidine (PEPCID) tablet 40 mg, 40 mg,  Oral, QHS, Rondel Baton, MD   finasteride (PROSCAR) tablet 5 mg, 5 mg, Oral, Daily, Rondel Baton, MD, 5 mg at 08/14/23 1191   furosemide (LASIX) tablet 20 mg, 20 mg, Oral, Daily, Rondel Baton, MD, 20 mg at 08/14/23 0936   melatonin tablet 10 mg, 10 mg, Oral, QHS, Rondel Baton, MD   memantine [facility]) tablet 10  mg, 10 mg, Oral, BID, Rondel Baton, MD, 10 mg at 08/14/23 2440   mycophenolate (CELLCEPT) capsule 1,000 mg, 1,000 mg, Oral, BID, Rondel Baton, MD, 1,000 mg at 08/14/23 1027   potassium chloride (KLOR-CON M) CR tablet 10 mEq, 10 mEq, Oral, BID, Rondel Baton, MD, 10 mEq at 08/14/23 2536   pyridostigmine (MESTINON) tablet 60 mg, 60 mg, Oral, BID, Rondel Baton, MD, 60 mg at 08/14/23 1037   QUEtiapine (SEROQUEL) tablet 50 mg, 50 mg, Oral, QHS, Rondel Baton, MD   sertraline (ZOLOFT) tablet 100 mg, 100 mg, Oral, Daily, Oaklee Sunga A, NP, 100 mg at 08/14/23 0936   tamsulosin (FLOMAX) capsule 0.4 mg, 0.4 mg, Oral, Daily, Rondel Baton, MD, 0.4 mg at 08/14/23 6440  Current Outpatient Medications:    donepezil (ARICEPT) 10 MG tablet, Take 1 tablet by mouth at bedtime, Disp: 30 tablet, Rfl: 11   famotidine (PEPCID) 40 MG tablet, Take 1 tablet (40 mg total) by mouth daily. (Patient taking differently: Take 40 mg by mouth at bedtime.), Disp: 30 tablet, Rfl: 11   finasteride (PROSCAR) 5 MG tablet, Take 1 tablet by mouth every day, Disp: 30 tablet, Rfl: 11   furosemide (LASIX) 20 MG tablet, Take 1 or 2 tablets by mouth every evening (Patient taking differently: Take 20 mg by mouth daily.), Disp: 60 tablet, Rfl: 11   LORazepam (ATIVAN) 0.5 MG tablet, Take 0.5 mg by mouth See admin instructions. Give 1 tablet (0.5mg ) at bedtime (control). May give patient an additional 0.5mg  every 6 hours as needed for anxiety and behaviors (hold for sedation)., Disp: , Rfl:    Melatonin 10 MG CAPS, Take 10 mg by mouth at bedtime., Disp: , Rfl:    memantine (NAMENDA)  10 MG tablet, Take 1 tablet by mouth twice daily, Disp: 60 tablet, Rfl: 11   mycophenolate (CELLCEPT) 500 MG tablet, Take 1,000 mg by mouth 2 (two) times daily., Disp: , Rfl:    Ostomy Supplies (SKIN PREP SPRAY) MISC, Apply 1 application  topically 2 (two) times a week. Apply skin prep and hydrocolloid dressing to wound twice a week., Disp: , Rfl:    potassium chloride (KLOR-CON) 10 MEQ tablet, Take 1 tablet by mouth twice daily, Disp: 60 tablet, Rfl: 11   pyridostigmine (MESTINON) 60 MG tablet, Take 60 mg by mouth 2 (two) times daily., Disp: , Rfl:    QUEtiapine (SEROQUEL) 50 MG tablet, Take 50 mg by mouth at bedtime., Disp: , Rfl:    sertraline (ZOLOFT) 100 MG tablet, Take 1 tablet by mouth every morning, Disp: 30 tablet, Rfl: 11   tamsulosin (FLOMAX) 0.4 MG CAPS capsule, Take 0.4 mg by mouth daily., Disp: , Rfl:    zinc oxide 20 % ointment, Apply 1 Application topically See admin instructions. Check buttocks twice a day and apply Zinc Oxide topically if bandage is off, remove bandage if soiled., Disp: , Rfl:   Allergies: Allergies  Allergen Reactions   Feraheme [Ferumoxytol] Other (See Comments)    Stroke like symptoms; has hx of MG as well    Vibramycin [Doxycycline] Other (See Comments)    Unknown reaction    Thomes Lolling, NP

## 2023-08-14 NOTE — ED Notes (Signed)
Called [facility]  FOR Pt  ETA 1 HOUR

## 2023-08-14 NOTE — ED Provider Notes (Signed)
Emergency Medicine Observation Re-evaluation Note  [name] is a 82 y.o. male, seen on rounds today.  Pt initially presented to the ED for complaints of Aggressive Behavior and Bradycardia Currently, the patient is not having any acute complaints.  Physical Exam  BP 127/64 (BP Location: Right Arm)   Pulse (!) 49   Temp (!) 97.5 F (36.4 C) (Oral)   Resp 16   SpO2 100%  Physical Exam General: Resting comfortably in stretcher Lungs: Normal work of breathing Psych: Calm   ED Course / MDM  EKG:EKG Interpretation Date/Time:  Thursday August 13 2023 17:34:16 EST Ventricular Rate:  60 PR Interval:    QRS Duration:  98 QT Interval:  435 QTC Calculation: 435 R Axis:   58  Text Interpretation: Normal sinus rhythm Abnormal R-wave progression, early transition No significant change since last tracing Confirmed by Richardean Canal [phone]) on 08/13/2023 5:49:22 PM  I have reviewed the labs performed to date as well as medications administered while in observation.  Recent changes in the last 24 hours include patient medically cleared.  Psych was unable to evaluate the patient because he was asleep.  Plan  Current plan is for TTS evaluation.  Home medications reconciled and have been reordered this morning.    Rondel Baton, MD 08/14/23 [phone]

## 2023-08-16 ENCOUNTER — Emergency Department (HOSPITAL_COMMUNITY): Payer: Medicare Other

## 2023-08-16 ENCOUNTER — Other Ambulatory Visit: Payer: Self-pay

## 2023-08-16 ENCOUNTER — Emergency Department (HOSPITAL_COMMUNITY)
Admission: EM | Admit: 2023-08-16 | Discharge: 2023-08-16 | Disposition: A | Discharge status: Home / Self Care | Payer: Medicare Other | Attending: Emergency Medicine | Admitting: Emergency Medicine

## 2023-08-16 ENCOUNTER — Encounter (HOSPITAL_COMMUNITY): Payer: Self-pay | Admitting: *Deleted

## 2023-08-16 DIAGNOSIS — I440 Atrioventricular block, first degree: Secondary | ICD-10-CM | POA: Diagnosis not present

## 2023-08-16 DIAGNOSIS — Z743 Need for continuous supervision: Secondary | ICD-10-CM | POA: Diagnosis not present

## 2023-08-16 DIAGNOSIS — I110 Hypertensive heart disease with heart failure: Secondary | ICD-10-CM | POA: Diagnosis not present

## 2023-08-16 DIAGNOSIS — I6523 Occlusion and stenosis of bilateral carotid arteries: Secondary | ICD-10-CM | POA: Diagnosis not present

## 2023-08-16 DIAGNOSIS — W19XXXA Unspecified fall, initial encounter: Secondary | ICD-10-CM | POA: Diagnosis not present

## 2023-08-16 DIAGNOSIS — Z96612 Presence of left artificial shoulder joint: Secondary | ICD-10-CM | POA: Diagnosis not present

## 2023-08-16 DIAGNOSIS — R0989 Other specified symptoms and signs involving the circulatory and respiratory systems: Secondary | ICD-10-CM | POA: Diagnosis not present

## 2023-08-16 DIAGNOSIS — E876 Hypokalemia: Secondary | ICD-10-CM | POA: Diagnosis not present

## 2023-08-16 DIAGNOSIS — Z008 Encounter for other general examination: Secondary | ICD-10-CM

## 2023-08-16 DIAGNOSIS — Z7401 Bed confinement status: Secondary | ICD-10-CM | POA: Diagnosis not present

## 2023-08-16 DIAGNOSIS — S2242XA Multiple fractures of ribs, left side, initial encounter for closed fracture: Secondary | ICD-10-CM | POA: Diagnosis not present

## 2023-08-16 DIAGNOSIS — F0390 Unspecified dementia without behavioral disturbance: Secondary | ICD-10-CM | POA: Insufficient documentation

## 2023-08-16 DIAGNOSIS — S0990XA Unspecified injury of head, initial encounter: Secondary | ICD-10-CM | POA: Insufficient documentation

## 2023-08-16 DIAGNOSIS — I4430 Unspecified atrioventricular block: Secondary | ICD-10-CM | POA: Diagnosis not present

## 2023-08-16 DIAGNOSIS — R918 Other nonspecific abnormal finding of lung field: Secondary | ICD-10-CM | POA: Diagnosis not present

## 2023-08-16 DIAGNOSIS — R531 Weakness: Secondary | ICD-10-CM | POA: Diagnosis not present

## 2023-08-16 DIAGNOSIS — I509 Heart failure, unspecified: Secondary | ICD-10-CM | POA: Diagnosis not present

## 2023-08-16 DIAGNOSIS — R001 Bradycardia, unspecified: Secondary | ICD-10-CM | POA: Diagnosis not present

## 2023-08-16 DIAGNOSIS — Z043 Encounter for examination and observation following other accident: Secondary | ICD-10-CM | POA: Diagnosis not present

## 2023-08-16 DIAGNOSIS — M19012 Primary osteoarthritis, left shoulder: Secondary | ICD-10-CM | POA: Diagnosis not present

## 2023-08-16 DIAGNOSIS — R404 Transient alteration of awareness: Secondary | ICD-10-CM | POA: Diagnosis not present

## 2023-08-16 DIAGNOSIS — R9389 Abnormal findings on diagnostic imaging of other specified body structures: Secondary | ICD-10-CM | POA: Diagnosis not present

## 2023-08-16 DIAGNOSIS — Z79899 Other long term (current) drug therapy: Secondary | ICD-10-CM | POA: Insufficient documentation

## 2023-08-16 LAB — BASIC METABOLIC PANEL
Anion gap: 6 (ref 5–15)
BUN: 22 mg/dL (ref 8–23)
CO2: 21 mmol/L — ABNORMAL LOW (ref 22–32)
Calcium: 8.5 mg/dL — ABNORMAL LOW (ref 8.9–10.3)
Chloride: 112 mmol/L — ABNORMAL HIGH (ref 98–111)
Creatinine, Ser: 0.6 mg/dL — ABNORMAL LOW (ref 0.61–1.24)
GFR, Estimated: >60 mL/min (ref >60)
Glucose, Bld: 85 mg/dL (ref 70–99)
Potassium: 3.2 mmol/L — ABNORMAL LOW (ref 3.5–5.1)
Sodium: 139 mmol/L (ref 135–145)

## 2023-08-16 LAB — CBC WITH DIFFERENTIAL/PLATELET
Abs Immature Granulocytes: 0.01 10*3/uL (ref 0.00–0.07)
Basophils Absolute: 0 10*3/uL (ref 0.0–0.1)
Basophils Relative: 1 %
Eosinophils Absolute: 0.1 10*3/uL (ref 0.0–0.5)
Eosinophils Relative: 3 %
HCT: 40 % (ref 39.0–52.0)
Hemoglobin: 12.9 g/dL — ABNORMAL LOW (ref 13.0–17.0)
Immature Granulocytes: 0 %
Lymphocytes Relative: 27 %
Lymphs Abs: 1.1 10*3/uL (ref 0.7–4.0)
MCH: 31.3 pg (ref 26.0–34.0)
MCHC: 32.3 g/dL (ref 30.0–36.0)
MCV: 97.1 fL (ref 80.0–100.0)
Monocytes Absolute: 0.8 10*3/uL (ref 0.1–1.0)
Monocytes Relative: 19 %
Neutro Abs: 2.1 10*3/uL (ref 1.7–7.7)
Neutrophils Relative %: 50 %
Platelets: 152 10*3/uL (ref 150–400)
RBC: 4.12 MIL/uL — ABNORMAL LOW (ref 4.22–5.81)
RDW: 13.4 % (ref 11.5–15.5)
WBC: 4.1 10*3/uL (ref 4.0–10.5)
nRBC: 0 % (ref 0.0–0.2)

## 2023-08-16 MED ORDER — POTASSIUM CHLORIDE 20 MEQ PO PACK
40.0000 meq | PACK | Freq: Once | ORAL | Status: AC
  Administered 2023-08-16: 40 meq via ORAL
  Filled 2023-08-16: qty 2

## 2023-08-16 NOTE — ED Provider Notes (Signed)
[place] EMERGENCY DEPARTMENT AT [facility] Provider Note   CSN: [id number] Arrival date & time: 08/16/23  0112     History  Chief Complaint  Patient presents with   [name]    OLEG GOGGANS is a 82 y.o. male.  82 y/o male with hx of CHF, BPH, anemia, GERD, HLD, HTN, myasthenia gravis, arthritis, sleep apnea, and dementia presents to the ED from [facility] for evaluation. Per EMS, patient was found in another patient's room. Presumed to have fallen, but no trauma witnessed by staff. Patient unable to contribute to history. This is his 3rd visit to the ED since 12/17; initial visit was for symptoms of GI illness which wife then contracted a day later. Subsequently seen on 12/19 for altered mental status evaluation. Psych cleared. Per chart review, patient was recommended to come off his Ativan, due to concern for delirium associated with benzodiazepine use, and to have his Seroquel dosing increased to 100mg  at bedtime. Wife reports requesting a nighttime sitter at his facility, but one is not able to start until Christmas Day.   Fall       Home Medications Prior to Admission medications   Medication Sig Start Date End Date Taking? Authorizing Provider  donepezil (ARICEPT) 10 MG tablet Take 1 tablet by mouth at bedtime 01/13/23   [name], Georgina Quint, MD  famotidine (PEPCID) 40 MG tablet Take 1 tablet (40 mg total) by mouth daily. Patient taking differently: Take 40 mg by mouth at bedtime. 04/13/23   [name], Georgina Quint, MD  finasteride (PROSCAR) 5 MG tablet Take 1 tablet by mouth every day 04/20/23   [name], Georgina Quint, MD  furosemide (LASIX) 20 MG tablet Take 1 or 2 tablets by mouth every evening Patient taking differently: Take 20 mg by mouth daily. 09/22/22   [name], Georgina Quint, MD  Melatonin 10 MG CAPS Take 10 mg by mouth at bedtime.    [provider]  memantine (NAMENDA) 10 MG tablet Take 1 tablet by mouth twice daily 06/23/23   [name],  Georgina Quint, MD  mycophenolate (CELLCEPT) 500 MG tablet Take 1,000 mg by mouth 2 (two) times daily.    [provider]  Ostomy Supplies (SKIN PREP SPRAY) MISC Apply 1 application  topically 2 (two) times a week. Apply skin prep and hydrocolloid dressing to wound twice a week.    [provider]  potassium chloride (KLOR-CON) 10 MEQ tablet Take 1 tablet by mouth twice daily 06/12/23   [name], Georgina Quint, MD  pyridostigmine (MESTINON) 60 MG tablet Take 60 mg by mouth 2 (two) times daily.    [provider]  QUEtiapine (SEROQUEL) 50 MG tablet Take 2 tablets (100 mg total) by mouth at bedtime. 08/14/23   Rondel Baton, MD  sertraline (ZOLOFT) 100 MG tablet Take 1 tablet by mouth every morning 05/24/23   [name], Georgina Quint, MD  tamsulosin (FLOMAX) 0.4 MG CAPS capsule Take 0.4 mg by mouth daily.    [provider]  zinc oxide 20 % ointment Apply 1 Application topically See admin instructions. Check buttocks twice a day and apply Zinc Oxide topically if bandage is off, remove bandage if soiled.    [provider]      Allergies    Feraheme [ferumoxytol] and Vibramycin [doxycycline]    Review of Systems   Review of Systems  Unable to perform ROS: Dementia    Physical Exam Updated Vital Signs BP 119/71   Pulse (!) 56   Temp  97.6 F (36.4 C) (Oral)   Resp 16   Ht 6' (1.829 m)   Wt 113 kg   SpO2 100%   BMI 33.79 kg/m   Physical Exam Vitals and nursing note reviewed.  Constitutional:      General: He is not in acute distress.    Appearance: He is well-developed. He is not diaphoretic.     Comments: Nontoxic appearing, sleeping. Will wake to loud voice, sternal rubbing.  HENT:     Head: Normocephalic and atraumatic.  Eyes:     General: No scleral icterus.    Conjunctiva/sclera: Conjunctivae normal.  Cardiovascular:     Rate and Rhythm: Regular rhythm. Bradycardia present.     Pulses: Normal pulses.  Pulmonary:     Effort: Pulmonary  effort is normal. No respiratory distress.     Breath sounds: No stridor. No wheezing.     Comments: Respirations even and unlabored Musculoskeletal:        General: Normal range of motion.     Cervical back: Normal range of motion.  Skin:    General: Skin is warm and dry.     Coloration: Skin is not pale.     Findings: No erythema or rash.  Neurological:     Mental Status: He is alert.     Coordination: Coordination normal.     Comments: Moving extremities spontaneously.  Psychiatric:        Behavior: Behavior normal.     ED Results / Procedures / Treatments   Labs (all labs ordered are listed, but only abnormal results are displayed) Labs Reviewed  CBC WITH DIFFERENTIAL/PLATELET - Abnormal; Notable for the following components:      Result Value   RBC 4.12 (*)    Hemoglobin 12.9 (*)    All other components within normal limits  BASIC METABOLIC PANEL - Abnormal; Notable for the following components:   Potassium 3.2 (*)    Chloride 112 (*)    CO2 21 (*)    Creatinine, Ser 0.60 (*)    Calcium 8.5 (*)    All other components within normal limits    EKG EKG Interpretation Date/Time:  Sunday August 16 2023 04:20:34 EST Ventricular Rate:  44 PR Interval:    QRS Duration:  85 QT Interval:  485 QTC Calculation: 415 R Axis:   55  Text Interpretation: Atrial fibrillation Abnormal R-wave progression, early transition Borderline repolarization abnormality Confirmed by Gilda Crease [id number]) on 08/16/2023 5:40:10 AM  Radiology DG Pelvis Portable Result Date: 08/16/2023 CLINICAL DATA:  Status post fall. EXAM: PORTABLE PELVIS 1-2 VIEWS COMPARISON:  March 27, 2014 FINDINGS: There is no evidence of acute pelvic fracture or diastasis. No pelvic bone lesions are seen. Stable postoperative changes are seen involving the mid and lower lumbar spine. IMPRESSION: No acute osseous abnormality. Electronically Signed   By: Aram Candela M.D.   On: 08/16/2023 03:21   DG Chest  Port 1 View Result Date: 08/16/2023 CLINICAL DATA:  Status post fall. EXAM: PORTABLE CHEST 1 VIEW COMPARISON:  August 13, 2023 FINDINGS: The heart size and mediastinal contours are within normal limits. Low lung volumes are noted with mild elevation of the left hemidiaphragm. Mild atelectasis and/or infiltrate is seen within the left lung base. No pleural effusion or pneumothorax is identified. Chronic seventh and eighth left rib fractures are noted. An intact left shoulder replacement is seen with multilevel degenerative changes noted throughout the thoracic spine. IMPRESSION: Low lung volumes with mild left basilar atelectasis and/or infiltrate.  Electronically Signed   By: Aram Candela M.D.   On: 08/16/2023 03:20   DG Shoulder Left Portable Result Date: 08/16/2023 CLINICAL DATA:  Status post fall. EXAM: LEFT SHOULDER COMPARISON:  None Available. FINDINGS: There is no evidence of an acute fracture or dislocation. A chronic seventh and eighth left rib fractures are seen. An intact left shoulder replacement is noted. Moderate severity degenerative changes are seen involving the left acromioclavicular joint. Soft tissues are unremarkable. IMPRESSION: 1. No acute fracture or dislocation. 2. Intact left shoulder replacement. 3. Moderate severity degenerative changes involving the left acromioclavicular joint. Electronically Signed   By: Aram Candela M.D.   On: 08/16/2023 03:13   CT HEAD WO CONTRAST ( ) Result Date: 08/16/2023 CLINICAL DATA:  Head trauma EXAM: CT HEAD WITHOUT CONTRAST TECHNIQUE: Contiguous axial images were obtained from the base of the skull through the vertex without intravenous contrast. RADIATION DOSE REDUCTION: This exam was performed according to the departmental dose-optimization program which includes automated exposure control, adjustment of the mA and/or kV according to patient size and/or use of iterative reconstruction technique. COMPARISON:  08/13/2023 FINDINGS:  Brain: There is no mass, hemorrhage or extra-axial collection. There is generalized atrophy without lobar predilection. Hypodensity of the white matter is most commonly associated with chronic microvascular disease. Vascular: Atherosclerotic calcification of the internal carotid arteries at the skull base. No abnormal hyperdensity of the major intracranial arteries or dural venous sinuses. Skull: The visualized skull base, calvarium and extracranial soft tissues are normal. Sinuses/Orbits: No fluid levels or advanced mucosal thickening of the visualized paranasal sinuses. No mastoid or middle ear effusion. Normal orbits. Other: Right-sided cochlear implant causes streak artifacts obscuring parts of the brain. IMPRESSION: 1. No acute intracranial abnormality. 2. Generalized atrophy and findings of chronic microvascular disease. Electronically Signed   By: Deatra Robinson M.D.   On: 08/16/2023 02:52    Procedures Procedures    Medications Ordered in ED Medications  potassium chloride (KLOR-CON) packet 40 mEq (40 mEq Oral Given 08/16/23 0530)    ED Course/ Medical Decision Making/ A&P Clinical Course as of 08/16/23 0548  Sun Aug 16, 2023  0345 Reviewed reassuring imaging results with wife who verbalizes understanding. Had UA completed during last ED visit which was negative. [KH]    Clinical Course User Index [KH] Antony Madura, PA-C                                 Medical Decision Making Amount and/or Complexity of Data Reviewed Labs: ordered. Radiology: ordered. ECG/medicine tests: ordered.  Risk Prescription drug management.   This patient presents to the ED for concern of possible fall, this involves an extensive number of treatment options, and is a complaint that carries with it a high risk of complications and morbidity.  The differential diagnosis includes ICH vs contusion vs fracture vs joint dislocation vs sprain/strain   Co morbidities that complicate the patient  evaluation  CHF Myasthenia gravis HTN Dementia   Additional history obtained:  Additional history obtained from EMS personnel and spouse, at bedside External records from outside source obtained and reviewed including negative head CT on 08/13/23. Negative UA on 08/11/23.   Lab Tests:  I Ordered, and personally interpreted labs.  The pertinent results include:  K 3.2, CO2 21.   Imaging Studies ordered:  I ordered imaging studies including CT head as well as Xrays of the chest, L shoulder, pelvis.  I independently visualized  and interpreted imaging which showed no acute or traumatic pathology I agree with the radiologist interpretation   Cardiac Monitoring:  The patient was maintained on a cardiac monitor.  I personally viewed and interpreted the cardiac monitored which showed an underlying rhythm of: sinus bradycardia   Medicines ordered and prescription drug management:  I have reviewed the patients home medicines and have made adjustments as needed   Test Considered:  UA - however, recently completed and was negative.   Problem List / ED Course:  As above Patient with no outward signs of trauma on examination. No complaints of pain. Patient sleeping, but able to be aroused. Sleepiness suspected to reflect medication changes as nightly Seroquel was recently changed to 100mg  at bedtime. Imaging negative for acute or traumatic pathology. Labs in keeping with baseline. Given oral potassium for mild hypokalemia. Do not feel further emergent work up is presently indicated. Patient stable for discharge back to [facility] via [facility].   Reevaluation:  After the interventions noted above, I reevaluated the patient and found that they have :stayed the same   Social Determinants of Health:  SNF resident   Dispostion:  After consideration of the diagnostic results and the patients response to treatment, I feel that the patent would benefit from adherence to  medications and close f/u with PCP. Return precautions discussed and provided. Patient discharged via [facility] in stable condition; spouse with no unaddressed concerns.           Final Clinical Impression(s) / ED Diagnoses Final diagnoses:  Encounter for medical assessment  Hypokalemia    Rx / DC Orders ED Discharge Orders     None         Antony Madura, PA-C 08/16/23 0553    Gilda Crease, MD 08/16/23 [phone]

## 2023-08-16 NOTE — Discharge Instructions (Addendum)
Your potassium was slightly low. This is likely diet related and can be rechecked by your primary care doctor. You were given oral potassium supplementation prior to discharge. The remainder of your evaluation was reassuring. Continue primary care follow up as needed. Return for new or concerning symptoms.

## 2023-08-16 NOTE — ED Triage Notes (Signed)
The ppt not responding by ems  the pt does not respond verbally   lt arm wast arrived by [facility]  he was found in another pts room   c/o a headache earlier   and the staff reported that he is more altered than usual  opens both eyes lt hip tender cbg 115

## 2023-08-16 NOTE — ED Notes (Signed)
[facility] called for transport to [facility]

## 2023-08-16 NOTE — ED Notes (Signed)
Attempted to call report to [facility], unsuccessful, left message.

## 2023-08-17 DIAGNOSIS — L89312 Pressure ulcer of right buttock, stage 2: Secondary | ICD-10-CM | POA: Diagnosis not present

## 2023-08-17 DIAGNOSIS — G309 Alzheimer's disease, unspecified: Secondary | ICD-10-CM | POA: Diagnosis not present

## 2023-08-17 DIAGNOSIS — L89152 Pressure ulcer of sacral region, stage 2: Secondary | ICD-10-CM | POA: Diagnosis not present

## 2023-08-17 DIAGNOSIS — G7000 Myasthenia gravis without (acute) exacerbation: Secondary | ICD-10-CM | POA: Diagnosis not present

## 2023-08-17 DIAGNOSIS — Z79899 Other long term (current) drug therapy: Secondary | ICD-10-CM | POA: Diagnosis not present

## 2023-08-21 DIAGNOSIS — Z79899 Other long term (current) drug therapy: Secondary | ICD-10-CM | POA: Diagnosis not present

## 2023-08-21 DIAGNOSIS — G7000 Myasthenia gravis without (acute) exacerbation: Secondary | ICD-10-CM | POA: Diagnosis not present

## 2023-08-21 DIAGNOSIS — L89312 Pressure ulcer of right buttock, stage 2: Secondary | ICD-10-CM | POA: Diagnosis not present

## 2023-08-21 DIAGNOSIS — G309 Alzheimer's disease, unspecified: Secondary | ICD-10-CM | POA: Diagnosis not present

## 2023-08-21 DIAGNOSIS — L89152 Pressure ulcer of sacral region, stage 2: Secondary | ICD-10-CM | POA: Diagnosis not present

## 2023-08-24 DIAGNOSIS — Z79899 Other long term (current) drug therapy: Secondary | ICD-10-CM | POA: Diagnosis not present

## 2023-08-24 DIAGNOSIS — L89152 Pressure ulcer of sacral region, stage 2: Secondary | ICD-10-CM | POA: Diagnosis not present

## 2023-08-24 DIAGNOSIS — L89312 Pressure ulcer of right buttock, stage 2: Secondary | ICD-10-CM | POA: Diagnosis not present

## 2023-08-24 DIAGNOSIS — G309 Alzheimer's disease, unspecified: Secondary | ICD-10-CM | POA: Diagnosis not present

## 2023-08-24 DIAGNOSIS — G7000 Myasthenia gravis without (acute) exacerbation: Secondary | ICD-10-CM | POA: Diagnosis not present

## 2023-08-25 DIAGNOSIS — L89152 Pressure ulcer of sacral region, stage 2: Secondary | ICD-10-CM | POA: Diagnosis not present

## 2023-08-25 DIAGNOSIS — L89312 Pressure ulcer of right buttock, stage 2: Secondary | ICD-10-CM | POA: Diagnosis not present

## 2023-08-25 DIAGNOSIS — Z79899 Other long term (current) drug therapy: Secondary | ICD-10-CM | POA: Diagnosis not present

## 2023-08-25 DIAGNOSIS — G7000 Myasthenia gravis without (acute) exacerbation: Secondary | ICD-10-CM | POA: Diagnosis not present

## 2023-08-25 DIAGNOSIS — G309 Alzheimer's disease, unspecified: Secondary | ICD-10-CM | POA: Diagnosis not present

## 2023-08-26 ENCOUNTER — Encounter (HOSPITAL_COMMUNITY): Payer: Self-pay

## 2023-08-26 ENCOUNTER — Other Ambulatory Visit: Payer: Self-pay

## 2023-08-26 ENCOUNTER — Emergency Department (HOSPITAL_COMMUNITY)
Admission: EM | Admit: 2023-08-26 | Discharge: 2023-08-29 | Disposition: A | Discharge status: Home / Self Care | Payer: Medicare Other | Attending: Emergency Medicine | Admitting: Emergency Medicine

## 2023-08-26 DIAGNOSIS — F329 Major depressive disorder, single episode, unspecified: Secondary | ICD-10-CM | POA: Diagnosis not present

## 2023-08-26 DIAGNOSIS — Z79899 Other long term (current) drug therapy: Secondary | ICD-10-CM | POA: Diagnosis not present

## 2023-08-26 DIAGNOSIS — I959 Hypotension, unspecified: Secondary | ICD-10-CM | POA: Diagnosis not present

## 2023-08-26 DIAGNOSIS — I110 Hypertensive heart disease with heart failure: Secondary | ICD-10-CM | POA: Diagnosis not present

## 2023-08-26 DIAGNOSIS — F03918 Unspecified dementia, unspecified severity, with other behavioral disturbance: Secondary | ICD-10-CM | POA: Diagnosis not present

## 2023-08-26 DIAGNOSIS — I10 Essential (primary) hypertension: Secondary | ICD-10-CM | POA: Diagnosis not present

## 2023-08-26 DIAGNOSIS — R451 Restlessness and agitation: Secondary | ICD-10-CM | POA: Diagnosis present

## 2023-08-26 DIAGNOSIS — F03B11 Unspecified dementia, moderate, with agitation: Secondary | ICD-10-CM | POA: Diagnosis not present

## 2023-08-26 DIAGNOSIS — I509 Heart failure, unspecified: Secondary | ICD-10-CM | POA: Diagnosis not present

## 2023-08-26 LAB — URINALYSIS, W/ REFLEX TO CULTURE (INFECTION SUSPECTED)
Bilirubin Urine: NEGATIVE
Glucose, UA: NEGATIVE mg/dL
Hgb urine dipstick: NEGATIVE
Ketones, ur: NEGATIVE mg/dL
Leukocytes,Ua: NEGATIVE
Nitrite: NEGATIVE
Protein, ur: NEGATIVE mg/dL
Specific Gravity, Urine: 1.023 (ref 1.005–1.030)
pH: 5 (ref 5.0–8.0)

## 2023-08-26 LAB — CBC WITH DIFFERENTIAL/PLATELET
Abs Immature Granulocytes: 0.01 10*3/uL (ref 0.00–0.07)
Basophils Absolute: 0 10*3/uL (ref 0.0–0.1)
Basophils Relative: 1 %
Eosinophils Absolute: 0.1 10*3/uL (ref 0.0–0.5)
Eosinophils Relative: 1 %
HCT: 41.7 % (ref 39.0–52.0)
Hemoglobin: 13.6 g/dL (ref 13.0–17.0)
Immature Granulocytes: 0 %
Lymphocytes Relative: 16 %
Lymphs Abs: 0.9 10*3/uL (ref 0.7–4.0)
MCH: 31 pg (ref 26.0–34.0)
MCHC: 32.6 g/dL (ref 30.0–36.0)
MCV: 95 fL (ref 80.0–100.0)
Monocytes Absolute: 0.9 10*3/uL (ref 0.1–1.0)
Monocytes Relative: 16 %
Neutro Abs: 3.7 10*3/uL (ref 1.7–7.7)
Neutrophils Relative %: 66 %
Platelets: 144 10*3/uL — ABNORMAL LOW (ref 150–400)
RBC: 4.39 MIL/uL (ref 4.22–5.81)
RDW: 13.2 % (ref 11.5–15.5)
WBC: 5.5 10*3/uL (ref 4.0–10.5)
nRBC: 0 % (ref 0.0–0.2)

## 2023-08-26 LAB — COMPREHENSIVE METABOLIC PANEL
ALT: 14 U/L (ref 0–44)
AST: 15 U/L (ref 15–41)
Albumin: 3.6 g/dL (ref 3.5–5.0)
Alkaline Phosphatase: 63 U/L (ref 38–126)
Anion gap: 7 (ref 5–15)
BUN: 21 mg/dL (ref 8–23)
CO2: 25 mmol/L (ref 22–32)
Calcium: 9.1 mg/dL (ref 8.9–10.3)
Chloride: 104 mmol/L (ref 98–111)
Creatinine, Ser: 0.6 mg/dL — ABNORMAL LOW (ref 0.61–1.24)
GFR, Estimated: >60 mL/min (ref >60)
Glucose, Bld: 101 mg/dL — ABNORMAL HIGH (ref 70–99)
Potassium: 3.4 mmol/L — ABNORMAL LOW (ref 3.5–5.1)
Sodium: 136 mmol/L (ref 135–145)
Total Bilirubin: 1.4 mg/dL — ABNORMAL HIGH (ref 0.0–1.2)
Total Protein: 5.9 g/dL — ABNORMAL LOW (ref 6.5–8.1)

## 2023-08-26 LAB — RAPID URINE DRUG SCREEN, HOSP PERFORMED
Amphetamines: NOT DETECTED
Barbiturates: NOT DETECTED
Benzodiazepines: POSITIVE — AB
Cocaine: NOT DETECTED
Opiates: NOT DETECTED
Tetrahydrocannabinol: NOT DETECTED

## 2023-08-26 MED ORDER — FUROSEMIDE 20 MG PO TABS
20.0000 mg | ORAL_TABLET | Freq: Every day | ORAL | Status: DC
  Administered 2023-08-26 – 2023-08-29 (×4): 20 mg via ORAL
  Filled 2023-08-26 (×4): qty 1

## 2023-08-26 MED ORDER — FAMOTIDINE 20 MG PO TABS
40.0000 mg | ORAL_TABLET | Freq: Every day | ORAL | Status: DC
  Administered 2023-08-26 – 2023-08-28 (×3): 40 mg via ORAL
  Filled 2023-08-26 (×3): qty 2

## 2023-08-26 MED ORDER — MYCOPHENOLATE MOFETIL 250 MG PO CAPS
1000.0000 mg | ORAL_CAPSULE | Freq: Two times a day (BID) | ORAL | Status: DC
  Administered 2023-08-26 – 2023-08-29 (×7): 1000 mg via ORAL
  Filled 2023-08-26 (×10): qty 4

## 2023-08-26 MED ORDER — MEMANTINE HCL 5 MG PO TABS
10.0000 mg | ORAL_TABLET | Freq: Two times a day (BID) | ORAL | Status: DC
  Administered 2023-08-26 – 2023-08-29 (×7): 10 mg via ORAL
  Filled 2023-08-26 (×6): qty 2

## 2023-08-26 MED ORDER — PYRIDOSTIGMINE BROMIDE 60 MG PO TABS
60.0000 mg | ORAL_TABLET | Freq: Two times a day (BID) | ORAL | Status: DC
  Administered 2023-08-26 – 2023-08-29 (×7): 60 mg via ORAL
  Filled 2023-08-26 (×10): qty 1

## 2023-08-26 MED ORDER — QUETIAPINE FUMARATE 100 MG PO TABS: 100.0000 mg | ORAL_TABLET | Freq: Every day | ORAL | Status: DC

## 2023-08-26 MED ORDER — SERTRALINE HCL 50 MG PO TABS
100.0000 mg | ORAL_TABLET | Freq: Every morning | ORAL | Status: DC
  Administered 2023-08-26 – 2023-08-29 (×4): 100 mg via ORAL
  Filled 2023-08-26 (×3): qty 2

## 2023-08-26 MED ORDER — FINASTERIDE 5 MG PO TABS
5.0000 mg | ORAL_TABLET | Freq: Every day | ORAL | Status: DC
  Administered 2023-08-26 – 2023-08-29 (×4): 5 mg via ORAL
  Filled 2023-08-26 (×4): qty 1

## 2023-08-26 MED ORDER — QUETIAPINE FUMARATE ER 200 MG PO TB24
200.0000 mg | ORAL_TABLET | Freq: Every day | ORAL | Status: DC
  Administered 2023-08-26 – 2023-08-28 (×3): 200 mg via ORAL
  Filled 2023-08-26 (×4): qty 1

## 2023-08-26 NOTE — Progress Notes (Signed)
 Pt has been psych cleared per Kyra Weber,NP. This CSW has added [facility]) Address: [address] [place], [place] [address] to pt's AVS as a resource. This CSW will now remove pt from the TTS/BH shift report.    Care Team notified: Efrain Allen CARROLYN Manuelita [facility], MSW, [facility] 08/26/2023 6:11 PM
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
## 2023-08-26 NOTE — Consult Note (Signed)
 [facility] Psychiatric Consult Initial  Patient Name: .[name]  MRN: [id number]  DOB: [birth year]  Consult Order details:  Orders (From admission, onward)     Start     Ordered   08/26/23 1252  CONSULT TO CALL ACT TEAM       Ordering Provider: Dreama Longs, MD  Provider:  (Not yet assigned)  Question:  Reason for Consult?  Answer:  agitation   08/26/23 1251             Mode of Visit: In person    Psychiatry Consult Evaluation  Service Date: August 26, 2023 LOS:  LOS: 0 days  Chief Complaint I don't want to be hurt. I want to go home  Primary Psychiatric Diagnoses  Dementia with behavioral disturbance   Assessment  [name] is a 83 y.o. male admitted: Presented to the ED for 08/26/2023  8:38 AM from [facility] for concerns related to agitation and aggression. He carries the psychiatric diagnoses of alzheimer's disease with behavioral disturbance, MDD, anxiety and has a past medical history of  HTN, CHF, CVA, OSA, dysphagia, GERD and myasthenia gravis.    His current presentation of behavioral disturbance is most consistent with dementia. He meets criteria for dementia based on clear decline in memory, and the documented steady progressive cognitive decline. Current outpatient psychotropic medications include donepizil, namenda , zoloft , and seroquel   and historically he has had a positive response to these medications. He was compliant with medications prior to admission as evidenced by  living in a facility where his medications are managed for him. On initial examination, patient is VERY hard of hearing.  He struggles to try to answer orientation questions, ultimately answering I don't know to questions about the year, the president or what town we are currently in. He is cooperative and friendly in the emergency department during assessment.  Please see plan below for detailed recommendations.   Diagnoses:  Active Hospital problems: Principal  Problem:   Dementia with behavioral disturbance (HCC)    Plan   ## Psychiatric Medication Recommendations:  Donepizil 10mg  Q HS Namenda  10mg  Q BID Zoloft  100mg  Q Day Increase Seroquel  XR 200mg  Q HS  ## Medical Decision Making Capacity: Not specifically addressed in this encounter  ## Further Work-up:  -- UDS -- most recent EKG on 08/26/2023 had QtC of 439 -- Pertinent labwork reviewed earlier this admission includes: CBC, CMP, UA   ## Disposition:-- There are no psychiatric contraindications to discharge at this time  ## Behavioral / Environmental: -Delirium Precautions: Delirium Interventions for Nursing and Staff: - RN to open blinds every AM. - To Bedside: Glasses, hearing aide, and pt's own shoes. Make available to patients. when possible and encourage use. - Encourage po fluids when appropriate, keep fluids within reach. - OOB to chair with meals. - Passive ROM exercises to all extremities with AM & PM care. - RN to assess orientation to person, time and place QAM and PRN. - Recommend extended visitation hours with familiar family/friends as feasible. - Staff to minimize disturbances at night. Turn off television when pt asleep or when not in use. -- Address hearing loss. Hearing aids or alternative communication devices (ie personal amplifiers, remote microphones, DECT systems, infrared systems) that reduce miscommunication may help slow cognitive decline and reduce confusion and anxiety.     ## Safety and Observation Level:  - Based on my clinical evaluation, I estimate the patient to be at no risk of self harm in the current  setting. - At this time, we recommend  routine. This decision is based on my review of the chart including patient's history and current presentation, interview of the patient, mental status examination, and consideration of suicide risk including evaluating suicidal ideation, plan, intent, suicidal or self-harm behaviors, risk factors, and protective factors.  This judgment is based on our ability to directly address suicide risk, implement suicide prevention strategies, and develop a safety plan while the patient is in the clinical setting. Please contact our team if there is a concern that risk level has changed.  CSSR Risk Category:C-SSRS RISK CATEGORY: No Risk  Suicide Risk Assessment: Patient has following modifiable risk factors for suicide: None, which we are addressing by N/A. Patient has following non-modifiable or demographic risk factors for suicide: male gender and psychiatric hospitalization Patient has the following protective factors against suicide: Access to outpatient mental health care, Supportive family, and no history of NSSIB  Thank you for this consult request. Recommendations have been communicated to the primary team.  We will sign off at this time.   Efrain DELENA Patient, NP       History of Present Illness  Relevant Aspects of Hospital ED Course:  Admitted on 08/26/2023 for from [facility] for concerns related to agitation and aggression. He carries the psychiatric diagnoses of alzheimer's disease with behavioral disturbance, MDD, anxiety and has a past medical history of  HTN, CHF, CVA, OSA, dysphagia, GERD and myasthenia gravis.    Patient Report:  On initial examination, patient is VERY hard of hearing.  He struggles to try to answer orientation questions, ultimately answering I don't know to questions about the year, the president or what town we are currently in. He is cooperative and friendly in the emergency department during assessment.  Patient says he does not know why he is in the hospital.  He is able to say his name, but is unable to state his birthdate.  Patient stated he has pain in his stomach, his left leg and his back.  Patient is unable to answer assessment questions clearly.  Psych ROS:  Depression: diagnosed  Anxiety:  diagnosed Mania (lifetime and current): none noted Psychosis: (lifetime and current):  none noted  Review of Systems  HENT:  Positive for hearing loss.        VERY Hard of Hearing   Gastrointestinal:  Positive for abdominal pain.  Musculoskeletal:  Positive for back pain.  All other systems reviewed and are negative.    Psychiatric and Social History  Psychiatric History:  Information collected from chart review  Prev Dx/Sx: alzheimer's disease with behavioral disturbance, MDD, anxiety Current Psych Provider: primary care is managing psychiatric care Home Meds (current): donepizil, namenda , zoloft , and seroquel    Previous Med Trials: unknown Therapy: None noted  Prior Psych Hospitalization: None noted  Prior Self Harm: None noted Prior Violence: None noted  Family Psych History: None noted  Family Hx suicide: None noted  Social History:  Developmental Hx: None noted Educational Hx: Unknown Occupational Hx: Unknown Legal Hx: None noted Living Situation: Living in a facility Spiritual Hx: None noted Access to weapons/lethal means: No   Substance History Alcohol : None noted Tobacco: None noted Illicit drugs: None noted Prescription drug abuse: None noted Rehab hx: None noted  Exam Findings  Physical Exam:  Vital Signs:  Temp:  [97.4 F (36.3 C)] 97.4 F (36.3 C) (01/01 0848) Pulse Rate:  [48] 48 (01/01 1051) Resp:  [15-16] 16 (01/01 1051) BP: (121-132)/(66) 132/66 (01/01 1051)  SpO2:  [100 %] 100 % (01/01 1051) Weight:  [886 kg] 113 kg (01/01 0908) Blood pressure 132/66, pulse (!) 48, temperature (!) 97.4 F (36.3 C), temperature source Oral, resp. rate 16, height 6' (1.829 m), weight 113 kg, SpO2 100%. Body mass index is 33.79 kg/m.  Physical Exam Vitals and nursing note reviewed.  Eyes:     Pupils: Pupils are equal, round, and reactive to light.  Pulmonary:     Effort: Pulmonary effort is normal.  Skin:    General: Skin is dry.  Neurological:     Mental Status: He is alert. He is disoriented.  Psychiatric:        Attention and  Perception: Attention normal.        Mood and Affect: Mood normal.        Speech: Speech normal.        Behavior: Behavior is cooperative.        Cognition and Memory: Memory is impaired. He exhibits impaired recent memory and impaired remote memory.     Mental Status Exam: General Appearance: Casual  Orientation:  Other:  oriented to self  Memory:  Immediate;   Poor Recent;   Poor Remote;   Poor  Concentration:  Concentration: Fair  Recall:  Poor  Attention  Fair  Eye Contact:  Good  Speech:  Clear and Coherent  Language:  Fair  Volume:  Normal  Mood: euthymic  Affect:  Congruent  Thought Process:  Disorganized  Thought Content:   Dementia - unclear  Suicidal Thoughts:  No  Homicidal Thoughts:  No  Judgement:  Impaired  Insight:  Lacking  Psychomotor Activity:  Shuffling Gait  Akathisia:  No  Fund of Knowledge:  Poor      Assets:  Housing Social Support  Cognition:  Impaired,  Severe  ADL's:  Intact  AIMS (if indicated):        Other History   These have been pulled in through the EMR, reviewed, and updated if appropriate.  Family History:  The patient's family history includes Alzheimer's disease in his brother and father; Aneurysm in his father; Autoimmune disease in his son; CAD in his brother; Cancer in his brother; Cancer (age of onset: 66) in his mother; Heart disease (age of onset: 10) in his mother; Heart disease (age of onset: 36) in his father; Stomach cancer in his brother.  Medical History: Past Medical History:  Diagnosis Date   Anemia    Blood transfusion without reported diagnosis 02/23/2014   3 units for iron  deficiency anemia. as a baby - whenhe had pneumonia   BPH (benign prostatic hyperplasia)    CTS (carpal tunnel syndrome)    better   Dementia (HCC)    Dyspnea    GERD (gastroesophageal reflux disease)    Hyperlipidemia    Hypertension    THEY DIAGNOSED ME YEARS AGO BUT LATELY IVE ACTUALLY HAD LOW BLOOD PRESSURES     Insomnia    LBP  (low back pain)    Myasthenia gravis (HCC)    Osteoarthritis    Sleep apnea    no cpap    Surgical History: Past Surgical History:  Procedure Laterality Date   CARDIAC CATHETERIZATION N/A 08/15/2015   Procedure: Left Heart Cath and Coronary Angiography;  Surgeon: Dorn JINNY Lesches, MD;  Location: [facility] INVASIVE CV LAB;  Service: Cardiovascular;  Laterality: N/A;   CARPAL TUNNEL RELEASE Bilateral 1996, 2004   Dr Dulcie thumb surgery.  more than once   COCHLEAR IMPLANT  04/10/2022   COLONOSCOPY  2005, 2012   diverticulosis.    ESOPHAGOGASTRODUODENOSCOPY N/A 02/23/2014   Procedure: ESOPHAGOGASTRODUODENOSCOPY (EGD);  Surgeon: Norleen LOISE Kiang, MD;  Location: [facility] ENDOSCOPY;  Service: Endoscopy;  Laterality: N/A;   EYE SURGERY  2012   both cataracts, Lasik   LEFT KNEE ARTHROSCOPY Left 07/16/2017    [facility]    LUMBAR FUSION     x 3   REVERSE SHOULDER ARTHROPLASTY Left 07/04/2016   Procedure: LEFT REVERSE SHOULDER ARTHROPLASTY;  Surgeon: Marcey Her, MD;  Location: [facility] OR;  Service: Orthopedics;  Laterality: Left;   TONSILLECTOMY  1946   TOTAL KNEE ARTHROPLASTY  2003   Right   TOTAL KNEE ARTHROPLASTY Left 09/25/2017   Procedure: LEFT TOTAL KNEE ARTHROPLASTY;  Surgeon: Gerome Charleston, MD;  Location: [facility] ORS;  Service: Orthopedics;  Laterality: Left;     Medications:   Current Facility-Administered Medications:    famotidine  (PEPCID ) tablet 40 mg, 40 mg, Oral, QHS, Schlossman, Erin, MD   finasteride  (PROSCAR ) tablet 5 mg, 5 mg, Oral, Daily, Dreama, Erin, MD, 5 mg at 08/26/23 1450   furosemide  (LASIX ) tablet 20 mg, 20 mg, Oral, Daily, Dreama Longs, MD, 20 mg at 08/26/23 1450   memantine  (NAMENDA ) tablet 10 mg, 10 mg, Oral, BID, Dreama Longs, MD, 10 mg at 08/26/23 1450   mycophenolate  (CELLCEPT ) capsule 1,000 mg, 1,000 mg, Oral, BID, Dreama Longs, MD, 1,000 mg at 08/26/23 1545   pyridostigmine  (MESTINON ) tablet 60 mg, 60 mg, Oral, BID, Dreama Longs, MD, 60 mg at 08/26/23  1545   QUEtiapine  (SEROQUEL ) tablet 100 mg, 100 mg, Oral, QHS, Schlossman, Erin, MD   sertraline  (ZOLOFT ) tablet 100 mg, 100 mg, Oral, q morning, Dreama, Erin, MD, 100 mg at 08/26/23 1450  Current Outpatient Medications:    donepezil  (ARICEPT ) 10 MG tablet, Take 1 tablet by mouth at bedtime, Disp: 30 tablet, Rfl: 11   famotidine  (PEPCID ) 40 MG tablet, Take 1 tablet (40 mg total) by mouth daily. (Patient taking differently: Take 40 mg by mouth at bedtime.), Disp: 30 tablet, Rfl: 11   finasteride  (PROSCAR ) 5 MG tablet, Take 1 tablet by mouth every day, Disp: 30 tablet, Rfl: 11   furosemide  (LASIX ) 20 MG tablet, Take 1 or 2 tablets by mouth every evening (Patient taking differently: Take 20 mg by mouth daily.), Disp: 60 tablet, Rfl: 11   Melatonin 10 MG CAPS, Take 10 mg by mouth at bedtime., Disp: , Rfl:    memantine  (NAMENDA ) 10 MG tablet, Take 1 tablet by mouth twice daily, Disp: 60 tablet, Rfl: 11   mycophenolate  (CELLCEPT ) 500 MG tablet, Take 1,000 mg by mouth 2 (two) times daily., Disp: , Rfl:    Ostomy Supplies (SKIN PREP SPRAY) MISC, Apply 1 application  topically 2 (two) times a week. Apply skin prep and hydrocolloid dressing to wound twice a week., Disp: , Rfl:    potassium chloride  (KLOR-CON ) 10 MEQ tablet, Take 1 tablet by mouth twice daily, Disp: 60 tablet, Rfl: 11   pyridostigmine  (MESTINON ) 60 MG tablet, Take 60 mg by mouth 2 (two) times daily., Disp: , Rfl:    QUEtiapine  (SEROQUEL ) 50 MG tablet, Take 2 tablets (100 mg total) by mouth at bedtime., Disp: 60 tablet, Rfl: 0   sertraline  (ZOLOFT ) 100 MG tablet, Take 1 tablet by mouth every morning, Disp: 30 tablet, Rfl: 11   tamsulosin  (FLOMAX ) 0.4 MG CAPS capsule, Take 0.4 mg by mouth daily., Disp: , Rfl:    zinc  oxide 20 % ointment, Apply 1  Application topically See admin instructions. Check buttocks twice a day and apply Zinc  Oxide topically if bandage is off, remove bandage if soiled., Disp: , Rfl:   Allergies: Allergies   Allergen Reactions   Feraheme  [Ferumoxytol ] Other (See Comments)    Stroke like symptoms; has hx of MG as well    Vibramycin  [Doxycycline ] Other (See Comments)    Unknown reaction    Efrain DELENA Patient, NP

## 2023-08-26 NOTE — ED Notes (Addendum)
 Received notification from social work that patient has been pysch cleared

## 2023-08-27 MED ORDER — POTASSIUM CHLORIDE 20 MEQ PO PACK
40.0000 meq | PACK | Freq: Two times a day (BID) | ORAL | Status: DC
Start: 2023-08-27 — End: 2023-08-29
  Administered 2023-08-27 – 2023-08-29 (×4): 40 meq via ORAL
  Filled 2023-08-27 (×4): qty 2

## 2023-08-27 NOTE — ED Notes (Signed)
 Patient woke up this morning and got angry with staff. Staff was able to redirect patient.

## 2023-08-27 NOTE — Discharge Instructions (Addendum)
 Take valproic acid  (Depakote ) exactly as directed. Do not take more or less of it or take it more often than prescribed by your doctor. You can swallow the sprinkle capsules whole, or you can open the capsules and sprinkle the beads they contain on a teaspoonful of soft food, such as applesauce or pudding. Monitor Depakote  levels, liver function tests, and CBC with diff every 3-6 months.   It was our pleasure to provide your ER care today - we hope that you feel better.  Continue your medication. Follow up closely with your doctor in the coming week - future medication adjustment per your primary care doctor.   From your labs, your potassium level is slightly low - eat plenty of fruits and vegetables, and follow up with your doctor.   Should either you, your family/your guardian, or your current home/current facility, wish to pursue a new home or new placement for you - please work together, from your current facility, to find that new home.  Return to ER if worse, new symptoms, fevers, chest pain, trouble breathing, or other emergency concern.

## 2023-08-27 NOTE — ED Provider Notes (Signed)
 Emergency Medicine Observation Re-evaluation Note  [name] is a 83 y.o. male, seen on rounds today.  Pt initially presented to the ED for complaints of Agitation Currently, the patient is resting quietly.  Physical Exam  BP (!) 90/58 (BP Location: Left Arm) Comment: RN notified  Pulse 62   Temp 97.8 F (36.6 C) (Oral)   Resp 20   Ht 6' (1.829 m)   Wt 113 kg   SpO2 96%   BMI 33.79 kg/m  Physical Exam General: Sleeping.  No respiratory distress.   ED Course / MDM  EKG:EKG Interpretation Date/Time:  Wednesday August 26 2023 10:35:59 EST Ventricular Rate:  53 PR Interval:    QRS Duration:  84 QT Interval:  418 QTC Calculation: 392 R Axis:   63  Text Interpretation: Unclear if artifact versus atrial fibrillation Nonspecific ST and T wave abnormality Abnormal ECG When compared with ECG of 16-Aug-2023 04:20, unclear if artifact versus atrial fibrillation Confirmed by Dreama Longs ([id number]) on 08/26/2023 10:44:34 AM  I have reviewed the labs performed to date as well as medications administered while in observation.  Recent changes in the last 24 hours include none.  Plan  Current plan is for TTS consult.    Armenta Canning, MD 08/27/23 1226

## 2023-08-27 NOTE — Progress Notes (Signed)
-   [facility] Liaison Note:  This patient has a current referral for [facility] outpatient-based palliative care. They were not opened to services prior to this hospital visit but will be followed up on post discharge.   Please call for any outpatient based palliative care related questions or concerns.  Elouise Husband, BSN, RN, OCN [employer] [phone]

## 2023-08-27 NOTE — ED Notes (Signed)
 [facility] refusing patient

## 2023-08-27 NOTE — Progress Notes (Signed)
 CSW spoke with Dottie who reported they are concerned the pt is not stable. CSW faxed over initial provider note and psychiatric evaluation for Dottie and team to review. She will call this Clinical research associate back.

## 2023-08-27 NOTE — Progress Notes (Signed)
 [facility] is still reviewing documents and medications sent by this Clinical research associate. This Clinical research associate has not received a definitive response regarding return. TOC following.
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
## 2023-08-27 NOTE — ED Provider Notes (Signed)
 Emergency Medicine Observation Re-evaluation Note  [name] is a 83 y.o. male, seen on rounds today.  Pt initially presented to the ED for complaints of dementia with episodes of agitated behavior. Pt has been psych cleared for discharge. Pt currently calm, no complaints.   Physical Exam  BP 134/69 (BP Location: Left Arm)   Pulse (!) 49   Temp (!) 97.5 F (36.4 C) (Oral)   Resp 20   Ht 1.829 m (6')   Wt 113 kg   SpO2 97%   BMI 33.79 kg/m  Physical Exam General: calm, resting, easily aroused.  Cardiac: regular rate Lungs: breathing comfortably. Psych: calm. Normal mood and affect. Pt does not appear acutely agitated nor aggressive. He voices no thoughts of harm to self or others. He indicates no ill feelings towards anyone. Pt does not appear to be responding to internal stimuli.   ED Course / MDM   I have reviewed the labs performed to date as well as medications administered while in observation.  Recent changes in the last 24 hours include ED obs, reassessment.   Plan  Pt has been psych cleared for ED discharge.    Patient currently resides in facility/memory care, which appears to be the most appropriate location for patient.   Pt currently does not appear to be a danger to self or others. No acute psychosis is noted. That said, it appears patient does have history of dementia with periodic, episodic agitated behaviors (that we often see in patients with dementia). The best chance of managing these behaviors long term is in an environment that is stable and familiar to patient, with staff/care team familiar to patient, and who are trained in a variety of supportive and de-escalation techniques pertinent to patients with dementia and related behaviors, and with an abundance of empathy, patience, and understanding.   Should patient, his facility, or family, wish to transition patient to a different home - those parties can certainly pursue that, in collaborative fashion,  from his current home/current facility (including giving patient/family atleast 30 days notice of intent to discharge, and working with pt/family to find new facility during that time).   Return precautions provided.     Bernard Drivers, MD 08/27/23 [phone]

## 2023-08-27 NOTE — ED Notes (Signed)
 Attempted to call nursing facility back for discharge, reports they are not accepting patient at this time.

## 2023-08-27 NOTE — ED Notes (Signed)
 Patient up at bedside eating dinner stating "I can't be here all night".

## 2023-08-28 DIAGNOSIS — F03918 Unspecified dementia, unspecified severity, with other behavioral disturbance: Secondary | ICD-10-CM | POA: Diagnosis not present

## 2023-08-28 MED ORDER — LORAZEPAM 1 MG PO TABS
1.0000 mg | ORAL_TABLET | Freq: Once | ORAL | Status: AC
  Administered 2023-08-28: 1 mg via ORAL
  Filled 2023-08-28: qty 1

## 2023-08-28 MED ORDER — HALOPERIDOL LACTATE 5 MG/ML IJ SOLN
2.0000 mg | Freq: Once | INTRAMUSCULAR | Status: AC
  Administered 2023-08-28: 2 mg via INTRAMUSCULAR
  Filled 2023-08-28: qty 1

## 2023-08-28 MED ORDER — DIVALPROEX SODIUM 125 MG PO CSDR
125.0000 mg | DELAYED_RELEASE_CAPSULE | Freq: Three times a day (TID) | ORAL | Status: DC
  Administered 2023-08-28 – 2023-08-29 (×2): 125 mg via ORAL
  Filled 2023-08-28 (×2): qty 1

## 2023-08-28 MED ORDER — DIPHENHYDRAMINE HCL 50 MG/ML IJ SOLN
25.0000 mg | Freq: Once | INTRAMUSCULAR | Status: AC
  Administered 2023-08-28: 25 mg via INTRAMUSCULAR
  Filled 2023-08-28: qty 1

## 2023-08-28 NOTE — ED Provider Notes (Addendum)
 Emergency Medicine Observation Re-evaluation Note  [name] is a 83 y.o. male, seen on rounds today.  Pt initially presented to the ED for complaints of Agitation Currently, the patient is sleeping.  Physical Exam  BP 112/64 (BP Location: Left Arm)   Pulse (!) 49   Temp 97.7 F (36.5 C) (Oral)   Resp 18   Ht 6' (1.829 m)   Wt 113 kg   SpO2 99%   BMI 33.79 kg/m  Physical Exam General: nad Cardiac: bradycardia Lungs: clear Psych: calm and cooperative  ED Course / MDM  EKG:EKG Interpretation Date/Time:  Wednesday August 26 2023 10:35:59 EST Ventricular Rate:  53 PR Interval:    QRS Duration:  84 QT Interval:  418 QTC Calculation: 392 R Axis:   63  Text Interpretation: Unclear if artifact versus atrial fibrillation Nonspecific ST and T wave abnormality Abnormal ECG When compared with ECG of 16-Aug-2023 04:20, unclear if artifact versus atrial fibrillation Confirmed by Dreama Longs ([id number]) on 08/26/2023 10:44:34 AM  I have reviewed the labs performed to date as well as medications administered while in observation.  Recent changes in the last 24 hours include none.  Plan  Current plan is for pt cleared medically and psychologically.  Attempting to get pt back to [facility] place but waiting to hear if pt can go back. Pt is currently not posing a threat to himself or others    Doretha Folks, MD 08/28/23 [id number]    Doretha Folks, MD 08/28/23 1226

## 2023-08-28 NOTE — ED Notes (Signed)
 Pt walked out of his room and started trying to walk out. Pt states  I want to use the bathroom. Multiple staff attempt redirection to the restroom in his room but pt then begins cursing at staff and grabbing their arms. Pt has multiple similar outbursts. ED provider made aware and Psych NP made aware as well. Pt continues cursing and grabbing at staff while attempting to get up and leave his room.

## 2023-08-28 NOTE — ED Notes (Signed)
 Patient has been alert this shift. Patient confused and nonsensical at times. Patient restless and wanting to leave during. Resistive to redirection at times. PRNs given (see MAR).

## 2023-08-28 NOTE — Consult Note (Signed)
 [facility] Psychiatric Consult Initial  Patient Name: .[name]  MRN: [id number]  DOB: [birth year]  Consult Order details:  Orders (From admission, onward)     Start     Ordered   08/28/23 1151  CONSULT TO CALL ACT TEAM       Comments: We tried writing an ED note stating he was not a threat to others but facility is insisting is comes from psychiatry service  Ordering Provider: Doretha Folks, MD  Provider:  (Not yet assigned)  Question:  Reason for Consult?  Answer:  [facility] is requesting another eval due to initial eval stating pt is not a threat to self but did not mention if he was a threat to others.   08/28/23 1152   08/26/23 1252  CONSULT TO CALL ACT TEAM       Ordering Provider: Dreama Longs, MD  Provider:  (Not yet assigned)  Question:  Reason for Consult?  Answer:  agitation   08/26/23 1251             Mode of Visit: In person    Psychiatry Consult Evaluation  Service Date: August 28, 2023 LOS:  LOS: 0 days  Chief Complaint Psych Consult  Primary Psychiatric Diagnoses  Alzheimer's disease  2.   MDD  Assessment  [name] is a 83 y.o. male admitted: Presented to the EDfor 08/13/2023  5:21 PM for aggressive behaviors. He carries the psychiatric diagnoses of alzheimer's disease and has a past medical history of  HTN, CHF, CVA, OSA, dysphagia, GERD and myasthenia gravis.   His current presentation of behavioral disturbance is most consistent with dementia. He meets criteria for dementia based on clear decline in memory, and the documented steady progressive cognitive decline.  Current outpatient psychotropic medications include donepizil, namenda , zoloft , ativan  and seroquel  and historically he has had a positive response to these medications. He was compliant with medications prior to admission as evidenced by living in a facility where his medications are managed for him. On initial examination, patient is standing by his bedside  stating he has to use the bathroom. His wife is in the room with him, along with his NT sitter, and he knows his wife's name but is unable to recognize his wife, stating that is not my wife. He has no idea what the date is. Please see plan below for detailed recommendations.   Diagnoses:  Active Hospital problems: Principal Problem:   Dementia with behavioral disturbance (HCC)    Plan  ## Psychiatric Medication Recommendations:  Donepizil 10mg  Q HS Namenda  10mg  Q BID Zoloft  100mg  Q Day Increase Seroquel  XR 200mg  Q HS Started patient on Depakote  125 3 times daily sprinkles for mood   ## Medical Decision Making Capacity: Not specifically addressed in this encounter   ## Further Work-up:  -- UDS -- most recent EKG on 08/26/2023 had QtC of 439 -- Pertinent labwork reviewed earlier this admission includes: CBC, CMP, UA     ## Disposition:-- There are no psychiatric contraindications to discharge at this time. Patient currently resides in facility/memory care, which appears to be the most appropriate location for patient.    Pt currently does not appear to be a danger to self or others. No acute psychosis is noted. That said, it appears patient does have history of dementia with periodic, episodic agitated behaviors (that we often see in patients with dementia). The best chance of managing these behaviors long term is in an environment that is stable and  familiar to patient, with staff/care team familiar to patient, and who are trained in a variety of supportive and de-escalation techniques pertinent to patients with dementia and related behaviors, and with an abundance of empathy, patience, and understanding.    Should patient, his facility, or family, wish to transition patient to a different home - those parties can certainly pursue that, in collaborative fashion, from his current home/current facility (including giving patient/family atleast 30 days notice of intent to discharge, and working  with pt/family to find new facility during that time).    ## Behavioral / Environmental: -Delirium Precautions: Delirium Interventions for Nursing and Staff: - RN to open blinds every AM. - To Bedside: Glasses, hearing aide, and pt's own shoes. Make available to patients. when possible and encourage use. - Encourage po fluids when appropriate, keep fluids within reach. - OOB to chair with meals. - Passive ROM exercises to all extremities with AM & PM care. - RN to assess orientation to person, time and place QAM and PRN. - Recommend extended visitation hours with familiar family/friends as feasible. - Staff to minimize disturbances at night. Turn off television when pt asleep or when not in use. -- Address hearing loss. Hearing aids or alternative communication devices (ie personal amplifiers, remote microphones, DECT systems, infrared systems) that reduce miscommunication may help slow cognitive decline and reduce confusion and anxiety.                ## Safety and Observation Level:  - Based on my clinical evaluation, I estimate the patient to be at no risk of self harm or harm to others in the current setting. - At this time, we recommend  routine. This decision is based on my review of the chart including patient's history and current presentation, interview of the patient, mental status examination, and consideration of suicide risk including evaluating suicidal ideation, plan, intent, suicidal or self-harm behaviors, risk factors, and protective factors. This judgment is based on our ability to directly address suicide risk, implement suicide prevention strategies, and develop a safety plan while the patient is in the clinical setting. Please contact our team if there is a concern that risk level has changed.   CSSR Risk Category:C-SSRS RISK CATEGORY: No Risk   Suicide Risk Assessment: Patient has following modifiable risk factors for suicide: None, which we are addressing by N/A. Patient has  following non-modifiable or demographic risk factors for suicide: male gender and psychiatric hospitalization Patient has the following protective factors against suicide: Access to outpatient mental health care, Supportive family, and no history of NSSIB   Thank you for this consult request. Recommendations have been communicated to the primary team. We will observe patient overnight, to monitor patient starting a new medication.  CATHALEEN ADAM, PMHNP       History of Present Illness  Relevant Aspects of Hospital ED Course:  Admitted on 08/26/2023 for from [facility] for concerns related to agitation and aggression. He carries the psychiatric diagnoses of alzheimer's disease with behavioral disturbance, MDD, anxiety and has a past medical history of  HTN, CHF, CVA, OSA, dysphagia, GERD and myasthenia gravis.     Patient Report:  On initial examination, patient is standing beside his bed, stating that he has to use the restroom, patient's wife [name] Cooper is also in his room along with patient's sitter.  Patient is directed to use the restroom, but then states he does not have to go, as this provider starts to speak with him he states  he has a uterus on the unit.  Patient was very disoriented and confused.  He struggles to try to answer orientation questions, ultimately answering I don't know to questions about the year, the president or what town we are currently in.  Patient is agitated upon this assessment.  Per nursing staff he has been increasingly agitated due to be visit of his wife.  Patient is able to be tell his wife her name but does not recognize her as his wife, he orders and direct her to leave his home in the hospital.  Patient says he does not know why he is in the hospital.  He is able to say his name, but is unable to state his birthdate. Patient is unable to answer assessment questions clearly.  Spoke with [facility] [phone], she states  that, the facility will take patient back, but states that he has no trigger for his anger/agitation, states he just becomes upset and she and staff are  unable to understand why.  She states that she does not wish an affidavit from psychiatric provider stating that patient is not a danger to others.  Discussed with her that we have started patient on any medication Depakote  sprinkles 125 mg 3 times daily for mood, she is in agreement, states patient is able to return to facility once they have received an affidavit stating he is no longer a danger to others.  She also states that patient's wife is implementing that patient has designer, multimedia, which was started as soon as patient was discharged from [facility] ED.  She also states that once patient returns to facility they have put in place for him to start seeing an in-home/facility psychiatrist.  Patient currently resides in facility/memory care, which appears to be the most appropriate location for patient.    Pt currently does not appear to be a danger to self or others. No acute psychosis is noted. That said, it appears patient does have history of dementia with periodic, episodic agitated behaviors (that we often see in patients with dementia). The best chance of managing these behaviors long term is in an environment that is stable and familiar to patient, with staff/care team familiar to patient, and who are trained in a variety of supportive and de-escalation techniques pertinent to patients with dementia and related behaviors, and with an abundance of empathy, patience, and understanding.    Should patient, his facility, or family, wish to transition patient to a different home - those parties can certainly pursue that, in collaborative fashion, from his current home/current facility (including giving patient/family atleast 30 days notice of intent to discharge, and working with pt/family to find new facility during that time).   Psych ROS:   Depression: diagnosed  Anxiety:  diagnosed Mania (lifetime and current): none noted Psychosis: (lifetime and current): none noted  Review of Systems  HENT:  Positive for hearing loss.   Psychiatric/Behavioral:  Positive for memory loss.      Psychiatric and Social History  Psychiatric History:  Information collected from chart review   Prev Dx/Sx: alzheimer's disease with behavioral disturbance, MDD, anxiety Current Psych Provider: primary care is managing psychiatric care Home Meds (current): donepizil, namenda , zoloft , and seroquel    Previous Med Trials: unknown Therapy: None noted   Prior Psych Hospitalization: None noted  Prior Self Harm: None noted Prior Violence: None noted   Family Psych History: None noted  Family Hx suicide: None noted   Social History:  Developmental Hx:  None noted Educational Hx: Unknown Occupational Hx: Unknown Legal Hx: None noted Living Situation: Living in a facility Spiritual Hx: None noted Access to weapons/lethal means: No    Substance History Alcohol : None noted Tobacco: None noted Illicit drugs: None noted Prescription drug abuse: None noted Rehab hx: None noted   Exam Findings  Physical Exam:   Vital Signs:  Temp:  [97.6 F (36.4 C)-98.3 F (36.8 C)] 97.6 F (36.4 C) (01/03 1715) Pulse Rate:  [49-70] 55 (01/03 1715) Resp:  [18] 18 (01/03 1715) BP: (106-127)/(57-67) 127/67 (01/03 1715) SpO2:  [94 %-99 %] 96 % (01/03 1715) Blood pressure 127/67, pulse (!) 55, temperature 97.6 F (36.4 C), temperature source Oral, resp. rate 18, height 6' (1.829 m), weight 113 kg, SpO2 96%. Body mass index is 33.79 kg/m.  Physical Exam Vitals and nursing note reviewed. Exam conducted with a chaperone present.  Neurological:     Mental Status: He is alert.  Psychiatric:        Attention and Perception: He is inattentive.        Mood and Affect: Affect is labile.        Speech: Speech normal.        Behavior: Behavior is  uncooperative and agitated.        Cognition and Memory: Memory is impaired.        Judgment: Judgment is inappropriate.     Comments: Unable to assess patient's thought content     Mental Status Exam: General Appearance: Disheveled  Orientation:  Other:  Patient has dementia oriented to self  Memory:  Immediate;   Poor Recent;   Poor Remote;   Poor  Concentration:  Concentration: Poor and Attention Span: Poor  Recall:  Poor  Attention  Other: Poor  Eye Contact:  Minimal  Speech:  Clear and Coherent  Language:  Fair  Volume:  Normal  Mood: Agitated  Affect:  Congruent  Thought Process:  Disorganized  Thought Content:   Dementia  Suicidal Thoughts:   Patient has dementia unable to assess  Homicidal Thoughts:   Patient has dementia  Judgement:  Impaired  Insight:  Lacking  Psychomotor Activity:  Shuffling Gait  Akathisia:  No  Fund of Knowledge:  Poor      Assets:  Communication Skills Desire for Improvement Housing Intimacy  Cognition:  Impaired,  Severe  ADL's:  Impaired  AIMS (if indicated):        Other History   These have been pulled in through the EMR, reviewed, and updated if appropriate.  Family History:  The patient's family history includes Alzheimer's disease in his brother and father; Aneurysm in his father; Autoimmune disease in his son; CAD in his brother; Cancer in his brother; Cancer (age of onset: 40) in his mother; Heart disease (age of onset: 88) in his mother; Heart disease (age of onset: 20) in his father; Stomach cancer in his brother.  Medical History: Past Medical History:  Diagnosis Date   Anemia    Blood transfusion without reported diagnosis 02/23/2014   3 units for iron  deficiency anemia. as a baby - whenhe had pneumonia   BPH (benign prostatic hyperplasia)    CTS (carpal tunnel syndrome)    better   Dementia (HCC)    Dyspnea    GERD (gastroesophageal reflux disease)    Hyperlipidemia    Hypertension    THEY DIAGNOSED ME YEARS  AGO BUT LATELY IVE ACTUALLY HAD LOW BLOOD PRESSURES     Insomnia  LBP (low back pain)    Myasthenia gravis (HCC)    Osteoarthritis    Sleep apnea    no cpap    Surgical History: Past Surgical History:  Procedure Laterality Date   CARDIAC CATHETERIZATION N/A 08/15/2015   Procedure: Left Heart Cath and Coronary Angiography;  Surgeon: Dorn JINNY Lesches, MD;  Location: [facility] INVASIVE CV LAB;  Service: Cardiovascular;  Laterality: N/A;   CARPAL TUNNEL RELEASE Bilateral 1996, 2004   Dr Dulcie thumb surgery.  more than once   COCHLEAR IMPLANT  04/10/2022   COLONOSCOPY  2005, 2012   diverticulosis.    ESOPHAGOGASTRODUODENOSCOPY N/A 02/23/2014   Procedure: ESOPHAGOGASTRODUODENOSCOPY (EGD);  Surgeon: Norleen LOISE Kiang, MD;  Location: [facility] ENDOSCOPY;  Service: Endoscopy;  Laterality: N/A;   EYE SURGERY  2012   both cataracts, Lasik   LEFT KNEE ARTHROSCOPY Left 07/16/2017    [facility]    LUMBAR FUSION     x 3   REVERSE SHOULDER ARTHROPLASTY Left 07/04/2016   Procedure: LEFT REVERSE SHOULDER ARTHROPLASTY;  Surgeon: Marcey Her, MD;  Location: [facility] OR;  Service: Orthopedics;  Laterality: Left;   TONSILLECTOMY  1946   TOTAL KNEE ARTHROPLASTY  2003   Right   TOTAL KNEE ARTHROPLASTY Left 09/25/2017   Procedure: LEFT TOTAL KNEE ARTHROPLASTY;  Surgeon: Gerome Charleston, MD;  Location: [facility] ORS;  Service: Orthopedics;  Laterality: Left;     Medications:   Current Facility-Administered Medications:    divalproex  (DEPAKOTE  SPRINKLE) capsule 125 mg, 125 mg, Oral, TID, Motley-Mangrum, Moo Gravley A, PMHNP   famotidine  (PEPCID ) tablet 40 mg, 40 mg, Oral, QHS, Schlossman, Erin, MD, 40 mg at 08/27/23 2226   finasteride  (PROSCAR ) tablet 5 mg, 5 mg, Oral, Daily, Dreama, Erin, MD, 5 mg at 08/28/23 9093   furosemide  (LASIX ) tablet 20 mg, 20 mg, Oral, Daily, Dreama Longs, MD, 20 mg at 08/28/23 0907   memantine  (NAMENDA ) tablet 10 mg, 10 mg, Oral, BID, Dreama Longs, MD, 10 mg at 08/28/23 0908    mycophenolate  (CELLCEPT ) capsule 1,000 mg, 1,000 mg, Oral, BID, Dreama Longs, MD, 1,000 mg at 08/28/23 0907   potassium chloride  (KLOR-CON ) packet 40 mEq, 40 mEq, Oral, BID, Steinl, Kevin, MD, 40 mEq at 08/28/23 9093   pyridostigmine  (MESTINON ) tablet 60 mg, 60 mg, Oral, BID, Dreama Longs, MD, 60 mg at 08/28/23 9093   QUEtiapine  (SEROQUEL  XR) 24 hr tablet 200 mg, 200 mg, Oral, QHS, Weber, Kyra A, NP, 200 mg at 08/27/23 2231   sertraline  (ZOLOFT ) tablet 100 mg, 100 mg, Oral, q morning, Dreama Longs, MD, 100 mg at 08/28/23 0908  Current Outpatient Medications:    donepezil  (ARICEPT ) 10 MG tablet, Take 1 tablet by mouth at bedtime, Disp: 30 tablet, Rfl: 11   famotidine  (PEPCID ) 40 MG tablet, Take 1 tablet (40 mg total) by mouth daily. (Patient taking differently: Take 40 mg by mouth at bedtime.), Disp: 30 tablet, Rfl: 11   finasteride  (PROSCAR ) 5 MG tablet, Take 1 tablet by mouth every day, Disp: 30 tablet, Rfl: 11   furosemide  (LASIX ) 20 MG tablet, Take 1 or 2 tablets by mouth every evening (Patient taking differently: Take 20 mg by mouth daily.), Disp: 60 tablet, Rfl: 11   LORazepam  (ATIVAN ) 0.5 MG tablet, Take 0.5 mg by mouth at bedtime., Disp: , Rfl:    LORazepam  (ATIVAN ) 0.5 MG tablet, Take 0.5 mg by mouth every 6 (six) hours as needed for anxiety., Disp: , Rfl:    Melatonin 10 MG CAPS, Take 10 mg by mouth at bedtime.,  Disp: , Rfl:    memantine  (NAMENDA ) 10 MG tablet, Take 1 tablet by mouth twice daily, Disp: 60 tablet, Rfl: 11   mycophenolate  (CELLCEPT ) 500 MG tablet, Take 1,000 mg by mouth 2 (two) times daily., Disp: , Rfl:    potassium chloride  (KLOR-CON ) 10 MEQ tablet, Take 1 tablet by mouth twice daily, Disp: 60 tablet, Rfl: 11   pyridostigmine  (MESTINON ) 60 MG tablet, Take 60 mg by mouth 2 (two) times daily., Disp: , Rfl:    QUEtiapine  (SEROQUEL ) 50 MG tablet, Take 2 tablets (100 mg total) by mouth at bedtime., Disp: 60 tablet, Rfl: 0   sertraline  (ZOLOFT ) 100 MG tablet, Take 1  tablet by mouth every morning, Disp: 30 tablet, Rfl: 11   tamsulosin  (FLOMAX ) 0.4 MG CAPS capsule, Take 0.4 mg by mouth daily., Disp: , Rfl:    zinc  oxide 20 % ointment, Apply 1 Application topically See admin instructions. Check buttocks twice a day and apply Zinc  Oxide topically if bandage is off, remove bandage if soiled., Disp: , Rfl:    Ostomy Supplies (SKIN PREP SPRAY) MISC, Apply 1 application  topically 2 (two) times a week. Apply skin prep and hydrocolloid dressing to wound twice a week. (Patient not taking: Reported on 08/26/2023), Disp: , Rfl:   Allergies: Allergies  Allergen Reactions   Feraheme  [Ferumoxytol ] Other (See Comments)    Stroke like symptoms; has hx of MG as well    Vibramycin  [Doxycycline ] Other (See Comments)    Unknown reaction    Gabbie Marzo MOTLEY-MANGRUM, PMHNP

## 2023-08-28 NOTE — ED Notes (Signed)
 At change of shift it was noted that pt hearing aid was lying in bed beside the pt. Mr Damiano was examine by this clinical research associate and no signs of injury around right ear ,were it was last seen by this clinical research associate, and he denies any pain . On questioning he stated that his hearing aides are magnetically held in place and are easily removed. His wife was called and she confirmed that the device is held in place by magnets and that her only concern is that the device be stored securely because if is very expensive and she quoted more than $9000.00 dollars. The device was found place in pt's locker #30 and presents was verified by second person officer Dwan from security.

## 2023-08-28 NOTE — Progress Notes (Signed)
 Per Mrs. Hoeffner, she is the patient's POA. She reports she is the primary Management consultant. She is hopeful that the pt will transition back to [facility] this afternoon. She reported she has secured caregivers for this evening.

## 2023-08-28 NOTE — Progress Notes (Addendum)
 CSW attempted to contact [name], ED at [facility]. Secretary accepted this writers contact info for a call back.   Addend @ 11:13AM CSW received a call back from [name], production designer, theatre/television/film, who is requesting pt be reassessed by psychiatry and that documentation state he is not a danger to himself or others. Notified Dr. Doretha who will discuss with TTS.

## 2023-08-28 NOTE — Progress Notes (Signed)
 CSW has outreached to [employer] [facility] [employer]. Awaiting response.

## 2023-08-29 DIAGNOSIS — Z743 Need for continuous supervision: Secondary | ICD-10-CM | POA: Diagnosis not present

## 2023-08-29 MED ORDER — QUETIAPINE FUMARATE ER 200 MG PO TB24: 200.0000 mg | ORAL_TABLET | Freq: Every day | ORAL | 0 refills | Status: AC

## 2023-08-29 MED ORDER — FAMOTIDINE 40 MG PO TABS: 40.0000 mg | ORAL_TABLET | Freq: Every day | ORAL | 0 refills | Status: AC

## 2023-08-29 MED ORDER — DIVALPROEX SODIUM 125 MG PO CSDR: 125.0000 mg | DELAYED_RELEASE_CAPSULE | Freq: Three times a day (TID) | ORAL | 0 refills | Status: AC

## 2023-08-29 MED ORDER — FUROSEMIDE 20 MG PO TABS: 20.0000 mg | ORAL_TABLET | Freq: Every day | ORAL | 0 refills | Status: AC

## 2023-08-29 NOTE — Progress Notes (Addendum)
 CSW received a call from the Charge Nurse stating the facility will not allow the pt to return. The Charge Nurse provided the contact number for [facility] [phone]).   CSW spoke with [facility], who stated they need documentation from the psychiatric provider who conducted the pt's evaluations, confirming that the pt is not a danger to self or others. Olam provided her email address for sending the documentation.   CSW later received a call from the pt's RN, reporting that the DON of [address] would like to speak with CSW. CSW spoke with Buford Erm), the DON at [facility]. She reported the same request and provided her email address as well. She stated that once she receives the documentation, she will forward it to corporate and call this CSW back.    11:30 am CSW attempted to contact Garnette with [facility], left VM requesting a return call. TOC to follow.    12:00pm CSW received a call back from [place], the DON at [facility]. She stated the pt can return today and asked to arrange transportation for 5 PM, as that is when they can have the pt's sitter arranged. She also requested that any new medications be sent to [facility].   CSW received a call back from [facility] with [facility]. CSW informed him that the pt will discharge back to the facility today around 5. He confirmed pt sisters will be at facility by 5pm.  PTAR is to be arranged for 5 PM today. RN and MD have been made aware. No further TOC needs; TOC sign-off   Kristl Morioka M.Nereida Schepp, MSW, LCSWA [facility]  Transitions of Care Clinical Social Worker I Direct Dial: [phone]  Fax: [phone] [email or web address]

## 2023-08-29 NOTE — ED Notes (Addendum)
 Spoke with SW who will follow up with Misty Stanley [phone] at [facility] to help expedite pt returning to facility.

## 2023-08-29 NOTE — ED Notes (Signed)
 Spoke with Sol Balloon and director [facility] and informed them patient was being discharged. Told that patient could not return until he was cleared by psych for violence and they would not accept him back at this time. Charge nurse informed to follow up with facility.

## 2023-08-29 NOTE — Consult Note (Signed)
 [facility] Psychiatric Consult Follow-up  Patient Name: .[name]  MRN: [id number]  DOB: 10/23/1940  Consult Order details:  Orders (From admission, onward)     Start     Ordered   08/28/23 1151  CONSULT TO CALL ACT TEAM       Comments: We tried writing an ED note stating he was not a threat to others but facility is insisting is comes from psychiatry service  Ordering Provider: Doretha Folks, MD  Provider:  (Not yet assigned)  Question:  Reason for Consult?  Answer:  [facility] is requesting another eval due to initial eval stating pt is not a threat to self but did not mention if he was a threat to others.   08/28/23 1152   08/26/23 1252  CONSULT TO CALL ACT TEAM       Ordering Provider: Dreama Longs, MD  Provider:  (Not yet assigned)  Question:  Reason for Consult?  Answer:  agitation   08/26/23 1251             Mode of Visit: In person    Psychiatry Consult Evaluation  Service Date: August 29, 2023 LOS:  LOS: 0 days  Chief Complaint Psych Consult   Primary Psychiatric Diagnoses  Alzheimer's disease  2.  MDD  Assessment  [name] is a 83 y.o. male admitted: Presented to the ED for 08/13/2023  5:21 PM for aggressive behaviors. He carries the psychiatric diagnoses of alzheimer's disease and has a past medical history of  HTN, CHF, CVA, OSA, dysphagia, GERD and myasthenia gravis.    His current presentation of behavioral disturbance is most consistent with dementia. He meets criteria for dementia based on clear decline in memory, and the documented steady progressive cognitive decline.  Current outpatient psychotropic medications include donepizil, namenda , zoloft , ativan  and seroquel  and historically he has had a positive response to these medications. He was compliant with medications prior to admission as evidenced by living in a facility where his medications are managed for him. On initial examination, patient is laying in bed, watching  television, patient has been calm throughout morning and throughout night shift per RN and sitters. He continues to be disoriented on date, time and situation, oriented to self. Please see plan below for detailed recommendations.     Diagnoses:  Active Hospital problems: Principal Problem:   Dementia with behavioral disturbance (HCC)    Plan  ## Psychiatric Medication Recommendations:  Donepizil 10mg  Q HS Namenda  10mg  Q BID Zoloft  100mg  Q Day Seroquel  XR 200mg  Q HS Depakote  125 3 times daily sprinkles for mood   ## Medical Decision Making Capacity: Not specifically addressed in this encounter   ## Further Work-up:  -- UDS -- most recent EKG on 08/26/2023 had QtC of 439 -- Pertinent labwork reviewed earlier this admission includes: CBC, CMP, UA     ## Disposition:-- There are no psychiatric contraindications to discharge at this time. Patient currently resides in facility/memory care, which appears to be the most appropriate location for patient.  Patient was observed overnight by this provider, reassessed this morning, patient did not appear to have any adverse reactions or  side effects to be addition of Depakote  125 3 times daily for mood.  Patient has not shown any agitation or aggression this morning or throughout the night.  Patient appears not to have shown any aggression to others or self throughout his hospital admission, only noted when patient's wife visited, the patient showed increased agitation, and which  then he only was asking if his wife to leave, he did not place his hands on his wife or anyone else.   Pt currently does not appear to be a danger to self or others. No acute psychosis is noted. That said, it appears patient does have history of dementia with periodic, episodic agitated behaviors (that we often see in patients with dementia). The best chance of managing these behaviors long term is in an environment that is stable and familiar to patient, with staff/care team  familiar to patient, and who are trained in a variety of supportive and de-escalation techniques pertinent to patients with dementia and related behaviors, and with an abundance of empathy, patience, and understanding.    Should patient, his facility, or family, wish to transition patient to a different home - those parties can certainly pursue that, in collaborative fashion, from his current home/current facility (including giving patient/family atleast 30 days notice of intent to discharge, and working with pt/family to find new facility during that time).    ## Behavioral / Environmental: -Delirium Precautions: Delirium Interventions for Nursing and Staff: - RN to open blinds every AM. - To Bedside: Glasses, hearing aide, and pt's own shoes. Make available to patients. when possible and encourage use. - Encourage po fluids when appropriate, keep fluids within reach. - OOB to chair with meals. - Passive ROM exercises to all extremities with AM & PM care. - RN to assess orientation to person, time and place QAM and PRN. - Recommend extended visitation hours with familiar family/friends as feasible. - Staff to minimize disturbances at night. Turn off television when pt asleep or when not in use. -- Address hearing loss. Hearing aids or alternative communication devices (ie personal amplifiers, remote microphones, DECT systems, infrared systems) that reduce miscommunication may help slow cognitive decline and reduce confusion and anxiety.                ## Safety and Observation Level:  - Based on my clinical evaluation, I estimate the patient to be at no risk of self harm or harm to others in the current setting. - At this time, we recommend  routine. This decision is based on my review of the chart including patient's history and current presentation, interview of the patient, mental status examination, and consideration of suicide risk including evaluating suicidal ideation, plan, intent, suicidal or  self-harm behaviors, risk factors, and protective factors. This judgment is based on our ability to directly address suicide risk, implement suicide prevention strategies, and develop a safety plan while the patient is in the clinical setting. Please contact our team if there is a concern that risk level has changed.   CSSR Risk Category:C-SSRS RISK CATEGORY: No Risk   Suicide Risk Assessment: Patient has following modifiable risk factors for suicide: None, which we are addressing by N/A. Patient has following non-modifiable or demographic risk factors for suicide: male gender and psychiatric hospitalization Patient has the following protective factors against suicide: Access to outpatient mental health care, Supportive family, and no history of NSSIB   Thank you for this consult request. Recommendations have been communicated to the primary team. We will psychiatrically clear patient at this time. Depakote  125 mg Sprinkles, 30 day supply added to discharge, and sent to pharmacy.    CATHALEEN ADAM, PMHNP   History of Present Illness  Relevant Aspects of Hospital ED Course:  Admitted on 08/26/2023 for from [facility] for concerns related to agitation and aggression. He carries the psychiatric  diagnoses of alzheimer's disease with behavioral disturbance, MDD, anxiety and has a past medical history of  HTN, CHF, CVA, OSA, dysphagia, GERD and myasthenia gravis.     Patient Report:  On initial examination, patient is laying in bed, watching television, patient has been calm throughout morning and throughout night shift per RN and sitters. He continues to be disoriented on date, time and situation, oriented to self.  He continue to struggle to try to answer orientation questions, ultimately answering I don't know to questions about the year, the president or what town we are currently in.  He is able to say his name, but is unable to state his birthdate. Patient is currently quietly watching  television.   Spoke with [facility] [phone] on yesterday 08/29/23, she states that, the facility will take patient back, but states that he has no trigger for his anger/agitation, states he just becomes upset and she and staff are  unable to understand why.  She states that she will need an affidavit from psychiatric provider stating that patient is not a danger to others.  Discussed with her that we have started patient on any medication Depakote  sprinkles 125 mg 3 times daily for mood, she is in agreement, states patient is able to return to facility once they have received an affidavit stating he is no longer a danger to others.  She also states that patient's wife is implementing that patient has designer, multimedia, which was started as soon as patient was discharged from [facility] ED.  She also states that once patient returns to facility they have put in place for him to start seeing an in-home/facility psychiatrist.  Spoke with Tawni KYM SINNING social worker, she states she has spoken with [facility] of nursing, and Tawni will submit the paperwork from this provider to the facility.  This provider also discussed that a 30-day supply of Depakote  125 mg sprinkles has been sent to patient's pharmacy [facility] [place], to help with patient's mood.   Patient currently resides in facility/memory care, which appears to be the most appropriate location for patient.    Pt currently does not appear to be a danger to self or others. No acute psychosis is noted. That said, it appears patient does have history of dementia with periodic, episodic agitated behaviors (that we often see in patients with dementia). The best chance of managing these behaviors long term is in an environment that is stable and familiar to patient, with staff/care team familiar to patient, and who are trained in a variety of supportive and de-escalation techniques pertinent to  patients with dementia and related behaviors, and with an abundance of empathy, patience, and understanding.    Psych ROS:  Depression: diagnosed  Anxiety:  diagnosed Mania (lifetime and current): none noted Psychosis: (lifetime and current): none noted   Review of Systems  HENT:  Positive for hearing loss.   Psychiatric/Behavioral:  Positive for memory loss. Psychiatric and Social History  Psychiatric History:  Information collected from chart review   Prev Dx/Sx: alzheimer's disease with behavioral disturbance, MDD, anxiety Current Psych Provider: primary care is managing psychiatric care Home Meds (current): donepizil, namenda , zoloft , and seroquel    Previous Med Trials: unknown Therapy: None noted   Prior Psych Hospitalization: None noted  Prior Self Harm: None noted Prior Violence: None noted   Family Psych History: None noted  Family Hx suicide: None noted   Social History:  Developmental Hx: None noted Educational  Hx: Unknown Occupational Hx: Unknown Legal Hx: None noted Living Situation: Living in a facility Spiritual Hx: None noted Access to weapons/lethal means: No    Substance History Alcohol : None noted Tobacco: None noted Illicit drugs: None noted Prescription drug abuse: None noted Rehab hx: None noted  Exam Findings  Physical Exam:  Vital Signs:  Temp:  [97.6 F (36.4 C)-97.8 F (36.6 C)] 97.8 F (36.6 C) (01/04 0500) Pulse Rate:  [48-57] 48 (01/04 0500) Resp:  [16-18] 16 (01/04 0500) BP: (123-137)/(63-72) 137/72 (01/04 0500) SpO2:  [95 %-98 %] 98 % (01/04 0500) Blood pressure 137/72, pulse (!) 48, temperature 97.8 F (36.6 C), temperature source Oral, resp. rate 16, height 6' (1.829 m), weight 113 kg, SpO2 98%. Body mass index is 33.79 kg/m.  Physical Exam Psychiatric:        Attention and Perception: He is inattentive.        Mood and Affect: Mood normal. Affect is flat.        Speech: Speech normal.        Behavior: Behavior is  cooperative.        Cognition and Memory: Cognition is impaired. Memory is impaired.     Mental Status Exam: General Appearance: Casual  Orientation:  Other:  Patient oriented to self  Memory:  Poor  Concentration:  Concentration: Poor and Attention Span: Poor  Recall:  Poor  Attention  Poor  Eye Contact:  Poor  Speech:  Clear and Coherent  Language:  Fair  Volume:  Normal  Mood: euthymic  Affect:  Congruent  Thought Process:  Disorganized  Thought Content:  Illogical  Suicidal Thoughts:  No  Homicidal Thoughts:  No  Judgement:  Impaired  Insight:  Lacking  Psychomotor Activity:  Normal  Akathisia:  No  Fund of Knowledge:  Poor      Assets:  Communication Skills Desire for Improvement Housing Intimacy Social Support  Cognition:  Impaired,  Severe  ADL's:  Impaired  AIMS (if indicated):        Other History   These have been pulled in through the EMR, reviewed, and updated if appropriate.  Family History:  The patient's family history includes Alzheimer's disease in his brother and father; Aneurysm in his father; Autoimmune disease in his son; CAD in his brother; Cancer in his brother; Cancer (age of onset: 83) in his mother; Heart disease (age of onset: 35) in his mother; Heart disease (age of onset: 65) in his father; Stomach cancer in his brother.  Medical History: Past Medical History:  Diagnosis Date   Anemia    Blood transfusion without reported diagnosis 02/23/2014   3 units for iron  deficiency anemia. as a baby - whenhe had pneumonia   BPH (benign prostatic hyperplasia)    CTS (carpal tunnel syndrome)    better   Dementia (HCC)    Dyspnea    GERD (gastroesophageal reflux disease)    Hyperlipidemia    Hypertension    THEY DIAGNOSED ME YEARS AGO BUT LATELY IVE ACTUALLY HAD LOW BLOOD PRESSURES     Insomnia    LBP (low back pain)    Myasthenia gravis (HCC)    Osteoarthritis    Sleep apnea    no cpap    Surgical History: Past Surgical History:   Procedure Laterality Date   CARDIAC CATHETERIZATION N/A 08/15/2015   Procedure: Left Heart Cath and Coronary Angiography;  Surgeon: Dorn JINNY Lesches, MD;  Location: [facility] INVASIVE CV LAB;  Service: Cardiovascular;  Laterality: N/A;  CARPAL TUNNEL RELEASE Bilateral 1996, 2004   Dr Dulcie thumb surgery.  more than once   COCHLEAR IMPLANT  04/10/2022   COLONOSCOPY  2005, 2012   diverticulosis.    ESOPHAGOGASTRODUODENOSCOPY N/A 02/23/2014   Procedure: ESOPHAGOGASTRODUODENOSCOPY (EGD);  Surgeon: Norleen LOISE Kiang, MD;  Location: [facility] ENDOSCOPY;  Service: Endoscopy;  Laterality: N/A;   EYE SURGERY  2012   both cataracts, Lasik   LEFT KNEE ARTHROSCOPY Left 07/16/2017    [facility]    LUMBAR FUSION     x 3   REVERSE SHOULDER ARTHROPLASTY Left 07/04/2016   Procedure: LEFT REVERSE SHOULDER ARTHROPLASTY;  Surgeon: Marcey Her, MD;  Location: [facility] OR;  Service: Orthopedics;  Laterality: Left;   TONSILLECTOMY  1946   TOTAL KNEE ARTHROPLASTY  2003   Right   TOTAL KNEE ARTHROPLASTY Left 09/25/2017   Procedure: LEFT TOTAL KNEE ARTHROPLASTY;  Surgeon: Gerome Charleston, MD;  Location: [facility] ORS;  Service: Orthopedics;  Laterality: Left;     Medications:   Current Facility-Administered Medications:    divalproex  (DEPAKOTE  SPRINKLE) capsule 125 mg, 125 mg, Oral, TID, Motley-Mangrum, Reana Chacko A, PMHNP, 125 mg at 08/29/23 1101   famotidine  (PEPCID ) tablet 40 mg, 40 mg, Oral, QHS, Dreama Longs, MD, 40 mg at 08/28/23 2131   finasteride  (PROSCAR ) tablet 5 mg, 5 mg, Oral, Daily, Dreama Longs, MD, 5 mg at 08/29/23 1100   furosemide  (LASIX ) tablet 20 mg, 20 mg, Oral, Daily, Dreama Longs, MD, 20 mg at 08/29/23 1100   memantine  (NAMENDA ) tablet 10 mg, 10 mg, Oral, BID, Dreama Longs, MD, 10 mg at 08/29/23 1100   mycophenolate  (CELLCEPT ) capsule 1,000 mg, 1,000 mg, Oral, BID, Dreama Longs, MD, 1,000 mg at 08/29/23 1059   potassium chloride  (KLOR-CON ) packet 40 mEq, 40 mEq, Oral, BID, Steinl, Kevin, MD, 40  mEq at 08/29/23 1059   pyridostigmine  (MESTINON ) tablet 60 mg, 60 mg, Oral, BID, Dreama Longs, MD, 60 mg at 08/29/23 1059   QUEtiapine  (SEROQUEL  XR) 24 hr tablet 200 mg, 200 mg, Oral, QHS, Weber, Kyra A, NP, 200 mg at 08/28/23 2131   sertraline  (ZOLOFT ) tablet 100 mg, 100 mg, Oral, q morning, Dreama, Erin, MD, 100 mg at 08/29/23 1059  Current Outpatient Medications:    donepezil  (ARICEPT ) 10 MG tablet, Take 1 tablet by mouth at bedtime, Disp: 30 tablet, Rfl: 11   famotidine  (PEPCID ) 40 MG tablet, Take 1 tablet (40 mg total) by mouth daily. (Patient taking differently: Take 40 mg by mouth at bedtime.), Disp: 30 tablet, Rfl: 11   finasteride  (PROSCAR ) 5 MG tablet, Take 1 tablet by mouth every day, Disp: 30 tablet, Rfl: 11   furosemide  (LASIX ) 20 MG tablet, Take 1 or 2 tablets by mouth every evening (Patient taking differently: Take 20 mg by mouth daily.), Disp: 60 tablet, Rfl: 11   LORazepam  (ATIVAN ) 0.5 MG tablet, Take 0.5 mg by mouth at bedtime., Disp: , Rfl:    LORazepam  (ATIVAN ) 0.5 MG tablet, Take 0.5 mg by mouth every 6 (six) hours as needed for anxiety., Disp: , Rfl:    Melatonin 10 MG CAPS, Take 10 mg by mouth at bedtime., Disp: , Rfl:    memantine  (NAMENDA ) 10 MG tablet, Take 1 tablet by mouth twice daily, Disp: 60 tablet, Rfl: 11   mycophenolate  (CELLCEPT ) 500 MG tablet, Take 1,000 mg by mouth 2 (two) times daily., Disp: , Rfl:    potassium chloride  (KLOR-CON ) 10 MEQ tablet, Take 1 tablet by mouth twice daily, Disp: 60 tablet, Rfl: 11   pyridostigmine  (  MESTINON ) 60 MG tablet, Take 60 mg by mouth 2 (two) times daily., Disp: , Rfl:    QUEtiapine  (SEROQUEL ) 50 MG tablet, Take 2 tablets (100 mg total) by mouth at bedtime., Disp: 60 tablet, Rfl: 0   sertraline  (ZOLOFT ) 100 MG tablet, Take 1 tablet by mouth every morning, Disp: 30 tablet, Rfl: 11   tamsulosin  (FLOMAX ) 0.4 MG CAPS capsule, Take 0.4 mg by mouth daily., Disp: , Rfl:    zinc  oxide 20 % ointment, Apply 1 Application  topically See admin instructions. Check buttocks twice a day and apply Zinc  Oxide topically if bandage is off, remove bandage if soiled., Disp: , Rfl:    Ostomy Supplies (SKIN PREP SPRAY) MISC, Apply 1 application  topically 2 (two) times a week. Apply skin prep and hydrocolloid dressing to wound twice a week. (Patient not taking: Reported on 08/26/2023), Disp: , Rfl:   Allergies: Allergies  Allergen Reactions   Feraheme  [Ferumoxytol ] Other (See Comments)    Stroke like symptoms; has hx of MG as well    Vibramycin  [Doxycycline ] Other (See Comments)    Unknown reaction    Nitza Schmid MOTLEY-MANGRUM, PMHNP

## 2023-08-29 NOTE — ED Provider Notes (Signed)
 Emergency Medicine Observation Re-evaluation Note  [name] is a 83 y.o. male, seen on rounds today.  Pt initially presented to the ED for complaints of Agitation Currently, the patient is asleep.  Pt presented with agitation and dementia.  He has been psych cleared to go back to facility.  Physical Exam  BP 137/72 (BP Location: Left Arm)   Pulse (!) 48   Temp 97.8 F (36.6 C) (Oral)   Resp 16   Ht 6' (1.829 m)   Wt 113 kg   SpO2 98%   BMI 33.79 kg/m  Physical Exam General: asleep Cardiac: rr Lungs: clear Psych: calm  ED Course / MDM  EKG:EKG Interpretation Date/Time:  Wednesday August 26 2023 10:35:59 EST Ventricular Rate:  53 PR Interval:    QRS Duration:  84 QT Interval:  418 QTC Calculation: 392 R Axis:   63  Text Interpretation: Unclear if artifact versus atrial fibrillation Nonspecific ST and T wave abnormality Abnormal ECG When compared with ECG of 16-Aug-2023 04:20, unclear if artifact versus atrial fibrillation Confirmed by Dreama Longs ([id number]) on 08/26/2023 10:44:34 AM  I have reviewed the labs performed to date as well as medications administered while in observation.  Recent changes in the last 24 hours include psych eval.  Plan  Current plan is for discharge back to facility.     Dean Clarity, MD 08/29/23 [phone]

## 2023-08-29 NOTE — ED Notes (Addendum)
 Spoke with Terance Hart the director of nursing for facility. Requested that social work follow up with her before discharge. [phone]

## 2023-08-31 DIAGNOSIS — G309 Alzheimer's disease, unspecified: Secondary | ICD-10-CM | POA: Diagnosis not present

## 2023-08-31 DIAGNOSIS — Z79899 Other long term (current) drug therapy: Secondary | ICD-10-CM | POA: Diagnosis not present

## 2023-08-31 DIAGNOSIS — L89152 Pressure ulcer of sacral region, stage 2: Secondary | ICD-10-CM | POA: Diagnosis not present

## 2023-08-31 DIAGNOSIS — G7000 Myasthenia gravis without (acute) exacerbation: Secondary | ICD-10-CM | POA: Diagnosis not present

## 2023-08-31 DIAGNOSIS — L89312 Pressure ulcer of right buttock, stage 2: Secondary | ICD-10-CM | POA: Diagnosis not present

## 2023-09-01 DIAGNOSIS — Z515 Encounter for palliative care: Secondary | ICD-10-CM | POA: Diagnosis not present

## 2023-09-01 DIAGNOSIS — D696 Thrombocytopenia, unspecified: Secondary | ICD-10-CM | POA: Diagnosis not present

## 2023-09-02 DIAGNOSIS — E876 Hypokalemia: Secondary | ICD-10-CM | POA: Diagnosis not present

## 2023-09-02 DIAGNOSIS — G309 Alzheimer's disease, unspecified: Secondary | ICD-10-CM | POA: Diagnosis not present

## 2023-09-02 DIAGNOSIS — R451 Restlessness and agitation: Secondary | ICD-10-CM | POA: Diagnosis not present

## 2023-09-02 DIAGNOSIS — Z79899 Other long term (current) drug therapy: Secondary | ICD-10-CM | POA: Diagnosis not present

## 2023-09-03 DIAGNOSIS — G7000 Myasthenia gravis without (acute) exacerbation: Secondary | ICD-10-CM | POA: Diagnosis not present

## 2023-09-03 DIAGNOSIS — L89152 Pressure ulcer of sacral region, stage 2: Secondary | ICD-10-CM | POA: Diagnosis not present

## 2023-09-03 DIAGNOSIS — L89312 Pressure ulcer of right buttock, stage 2: Secondary | ICD-10-CM | POA: Diagnosis not present

## 2023-09-03 DIAGNOSIS — Z79899 Other long term (current) drug therapy: Secondary | ICD-10-CM | POA: Diagnosis not present

## 2023-09-03 DIAGNOSIS — E876 Hypokalemia: Secondary | ICD-10-CM | POA: Diagnosis not present

## 2023-09-03 DIAGNOSIS — G309 Alzheimer's disease, unspecified: Secondary | ICD-10-CM | POA: Diagnosis not present

## 2023-09-09 DIAGNOSIS — K219 Gastro-esophageal reflux disease without esophagitis: Secondary | ICD-10-CM | POA: Diagnosis not present

## 2023-09-09 DIAGNOSIS — G309 Alzheimer's disease, unspecified: Secondary | ICD-10-CM | POA: Diagnosis not present

## 2023-09-09 DIAGNOSIS — D649 Anemia, unspecified: Secondary | ICD-10-CM | POA: Diagnosis not present

## 2023-09-09 DIAGNOSIS — Z79899 Other long term (current) drug therapy: Secondary | ICD-10-CM | POA: Diagnosis not present

## 2023-09-09 DIAGNOSIS — I119 Hypertensive heart disease without heart failure: Secondary | ICD-10-CM | POA: Diagnosis not present

## 2023-09-09 DIAGNOSIS — E785 Hyperlipidemia, unspecified: Secondary | ICD-10-CM | POA: Diagnosis not present

## 2023-09-09 DIAGNOSIS — G7000 Myasthenia gravis without (acute) exacerbation: Secondary | ICD-10-CM | POA: Diagnosis not present

## 2023-09-10 DIAGNOSIS — Z79899 Other long term (current) drug therapy: Secondary | ICD-10-CM | POA: Diagnosis not present

## 2023-09-10 DIAGNOSIS — G309 Alzheimer's disease, unspecified: Secondary | ICD-10-CM | POA: Diagnosis not present

## 2023-09-10 DIAGNOSIS — G7000 Myasthenia gravis without (acute) exacerbation: Secondary | ICD-10-CM | POA: Diagnosis not present

## 2023-09-10 DIAGNOSIS — R451 Restlessness and agitation: Secondary | ICD-10-CM | POA: Diagnosis not present

## 2023-09-16 DIAGNOSIS — Z0000 Encounter for general adult medical examination without abnormal findings: Secondary | ICD-10-CM | POA: Diagnosis not present

## 2023-09-16 DIAGNOSIS — Z79899 Other long term (current) drug therapy: Secondary | ICD-10-CM | POA: Diagnosis not present

## 2023-09-16 DIAGNOSIS — G309 Alzheimer's disease, unspecified: Secondary | ICD-10-CM | POA: Diagnosis not present

## 2023-09-16 DIAGNOSIS — D649 Anemia, unspecified: Secondary | ICD-10-CM | POA: Diagnosis not present

## 2023-09-16 DIAGNOSIS — I119 Hypertensive heart disease without heart failure: Secondary | ICD-10-CM | POA: Diagnosis not present

## 2023-09-16 DIAGNOSIS — K219 Gastro-esophageal reflux disease without esophagitis: Secondary | ICD-10-CM | POA: Diagnosis not present

## 2023-09-16 DIAGNOSIS — G7000 Myasthenia gravis without (acute) exacerbation: Secondary | ICD-10-CM | POA: Diagnosis not present

## 2023-09-16 DIAGNOSIS — E785 Hyperlipidemia, unspecified: Secondary | ICD-10-CM | POA: Diagnosis not present

## 2023-10-05 DIAGNOSIS — M79675 Pain in left toe(s): Secondary | ICD-10-CM | POA: Diagnosis not present

## 2023-10-05 DIAGNOSIS — G300 Alzheimer's disease with early onset: Secondary | ICD-10-CM | POA: Diagnosis not present

## 2023-10-05 DIAGNOSIS — B351 Tinea unguium: Secondary | ICD-10-CM | POA: Diagnosis not present

## 2023-10-05 DIAGNOSIS — L608 Other nail disorders: Secondary | ICD-10-CM | POA: Diagnosis not present

## 2023-10-05 DIAGNOSIS — R2681 Unsteadiness on feet: Secondary | ICD-10-CM | POA: Diagnosis not present

## 2023-10-05 DIAGNOSIS — M79674 Pain in right toe(s): Secondary | ICD-10-CM | POA: Diagnosis not present

## 2023-10-05 DIAGNOSIS — L84 Corns and callosities: Secondary | ICD-10-CM | POA: Diagnosis not present

## 2023-10-21 DIAGNOSIS — Z713 Dietary counseling and surveillance: Secondary | ICD-10-CM | POA: Diagnosis not present

## 2023-10-21 DIAGNOSIS — R451 Restlessness and agitation: Secondary | ICD-10-CM | POA: Diagnosis not present

## 2023-10-21 DIAGNOSIS — Z66 Do not resuscitate: Secondary | ICD-10-CM | POA: Diagnosis not present

## 2023-10-21 DIAGNOSIS — G309 Alzheimer's disease, unspecified: Secondary | ICD-10-CM | POA: Diagnosis not present

## 2023-10-24 ENCOUNTER — Emergency Department (HOSPITAL_COMMUNITY)
Admission: EM | Admit: 2023-10-24 | Discharge: 2023-10-24 | Disposition: A | Discharge status: Home / Self Care | Attending: Emergency Medicine | Admitting: Emergency Medicine

## 2023-10-24 DIAGNOSIS — F02811 Dementia in other diseases classified elsewhere, unspecified severity, with agitation: Secondary | ICD-10-CM | POA: Insufficient documentation

## 2023-10-24 DIAGNOSIS — R001 Bradycardia, unspecified: Secondary | ICD-10-CM | POA: Diagnosis not present

## 2023-10-24 DIAGNOSIS — G309 Alzheimer's disease, unspecified: Secondary | ICD-10-CM | POA: Diagnosis not present

## 2023-10-24 DIAGNOSIS — Z743 Need for continuous supervision: Secondary | ICD-10-CM | POA: Diagnosis not present

## 2023-10-24 DIAGNOSIS — I509 Heart failure, unspecified: Secondary | ICD-10-CM | POA: Diagnosis not present

## 2023-10-24 DIAGNOSIS — R404 Transient alteration of awareness: Secondary | ICD-10-CM | POA: Diagnosis not present

## 2023-10-24 DIAGNOSIS — R451 Restlessness and agitation: Secondary | ICD-10-CM | POA: Diagnosis present

## 2023-10-24 LAB — CBC WITH DIFFERENTIAL/PLATELET
Abs Immature Granulocytes: 0.02 K/uL (ref 0.00–0.07)
Basophils Absolute: 0 K/uL (ref 0.0–0.1)
Basophils Relative: 1 %
Eosinophils Absolute: 0.1 K/uL (ref 0.0–0.5)
Eosinophils Relative: 2 %
HCT: 41.7 % (ref 39.0–52.0)
Hemoglobin: 12.7 g/dL — ABNORMAL LOW (ref 13.0–17.0)
Immature Granulocytes: 0 %
Lymphocytes Relative: 15 %
Lymphs Abs: 0.8 K/uL (ref 0.7–4.0)
MCH: 30 pg (ref 26.0–34.0)
MCHC: 30.5 g/dL (ref 30.0–36.0)
MCV: 98.6 fL (ref 80.0–100.0)
Monocytes Absolute: 1.1 K/uL — ABNORMAL HIGH (ref 0.1–1.0)
Monocytes Relative: 22 %
Neutro Abs: 3 K/uL (ref 1.7–7.7)
Neutrophils Relative %: 60 %
Platelets: 150 K/uL (ref 150–400)
RBC: 4.23 MIL/uL (ref 4.22–5.81)
RDW: 13.8 % (ref 11.5–15.5)
WBC: 5.1 K/uL (ref 4.0–10.5)
nRBC: 0 % (ref 0.0–0.2)

## 2023-10-24 LAB — URINALYSIS, W/ REFLEX TO CULTURE (INFECTION SUSPECTED)
Bacteria, UA: NONE SEEN
Bilirubin Urine: NEGATIVE
Glucose, UA: NEGATIVE mg/dL
Hgb urine dipstick: NEGATIVE
Ketones, ur: NEGATIVE mg/dL
Leukocytes,Ua: NEGATIVE
Nitrite: NEGATIVE
Protein, ur: NEGATIVE mg/dL
Specific Gravity, Urine: 1.017 (ref 1.005–1.030)
pH: 5 (ref 5.0–8.0)

## 2023-10-24 LAB — BASIC METABOLIC PANEL
Anion gap: 10 (ref 5–15)
BUN: 20 mg/dL (ref 8–23)
CO2: 26 mmol/L (ref 22–32)
Calcium: 9 mg/dL (ref 8.9–10.3)
Chloride: 102 mmol/L (ref 98–111)
Creatinine, Ser: 0.41 mg/dL — ABNORMAL LOW (ref 0.61–1.24)
GFR, Estimated: >60 mL/min (ref >60)
Glucose, Bld: 91 mg/dL (ref 70–99)
Potassium: 3.7 mmol/L (ref 3.5–5.1)
Sodium: 138 mmol/L (ref 135–145)

## 2023-10-24 LAB — VALPROIC ACID LEVEL: Valproic Acid Lvl: <10 ug/mL — ABNORMAL LOW (ref 50.0–100.0)

## 2023-10-24 MED ORDER — STERILE WATER FOR INJECTION IJ SOLN
INTRAMUSCULAR | Status: AC
  Administered 2023-10-24: 10 mL
  Filled 2023-10-24: qty 10

## 2023-10-24 MED ORDER — DIVALPROEX SODIUM 125 MG PO DR TAB
125.0000 mg | DELAYED_RELEASE_TABLET | Freq: Once | ORAL | Status: DC
  Filled 2023-10-24: qty 1

## 2023-10-24 MED ORDER — ZIPRASIDONE MESYLATE 20 MG IM SOLR
10.0000 mg | Freq: Once | INTRAMUSCULAR | Status: AC
  Administered 2023-10-24: 10 mg via INTRAMUSCULAR
  Filled 2023-10-24: qty 20

## 2023-10-24 NOTE — ED Notes (Signed)
 Patient became very aggressive towards Dara, paramedic. Multiple staff members had to assist patient back into the bed. Patient very agitated and wanting to leave. Pt is demented. Rolly Salter, [place] notified.

## 2023-10-24 NOTE — Discharge Instructions (Addendum)
 Your labs today were reassuring. Follow up with your provider to review your medication lists and make sure you are on the correct medications. The depakote level is low, follow up with your provider to see if you should be on it or not.  Get help right away if: You feel like you may hurt yourself or others. You have thoughts about taking your own life.

## 2023-10-24 NOTE — ED Provider Notes (Signed)
 [place] EMERGENCY DEPARTMENT AT [address] [facility] Provider Note   CSN: [id number] Arrival date & time: 10/24/23  8295     History  Chief Complaint  Patient presents with   Agitation    [name] is a 83 y.o. male with a history of Alzheimer's dementia, myasthenia gravis, and CHF who presents the ED today for agitation.  Patient was brought in by EMS from home facility due to agitation and being combative.  Patient has a known history of this associated with his Alzheimer's disease.  On evaluation he is pleasantly demented.  He denies having any pain or any recent falls.  No additional complaints or concerns at this time.    Home Medications Prior to Admission medications   Medication Sig Start Date End Date Taking? Authorizing Provider  divalproex (DEPAKOTE SPRINKLE) 125 MG capsule Take 1 capsule (125 mg total) by mouth 3 (three) times daily. 08/29/23   Motley-Mangrum, Ezra Sites, PMHNP  donepezil (ARICEPT) 10 MG tablet Take 1 tablet by mouth at bedtime 01/13/23   [name], Georgina Quint, MD  famotidine (PEPCID) 40 MG tablet Take 1 tablet (40 mg total) by mouth at bedtime. 08/29/23   Motley-Mangrum, Ezra Sites, PMHNP  finasteride (PROSCAR) 5 MG tablet Take 1 tablet by mouth every day 04/20/23   [name], Georgina Quint, MD  furosemide (LASIX) 20 MG tablet Take 1 tablet (20 mg total) by mouth daily. 08/30/23   Motley-Mangrum, Ezra Sites, PMHNP  Melatonin 10 MG CAPS Take 10 mg by mouth at bedtime.    [provider]  memantine (NAMENDA) 10 MG tablet Take 1 tablet by mouth twice daily 06/23/23   [name], Georgina Quint, MD  mycophenolate (CELLCEPT) 500 MG tablet Take 1,000 mg by mouth 2 (two) times daily.    [provider]  potassium chloride (KLOR-CON) 10 MEQ tablet Take 1 tablet by mouth twice daily 06/12/23   [name], Georgina Quint, MD  pyridostigmine (MESTINON) 60 MG tablet Take 60 mg by mouth 2 (two) times daily.    [provider]  QUEtiapine (SEROQUEL XR) 200 MG  24 hr tablet Take 1 tablet (200 mg total) by mouth at bedtime. 08/29/23   Motley-Mangrum, Ezra Sites, PMHNP  QUEtiapine (SEROQUEL) 50 MG tablet Take 2 tablets (100 mg total) by mouth at bedtime. 08/14/23   Rondel Baton, MD  sertraline (ZOLOFT) 100 MG tablet Take 1 tablet by mouth every morning 05/24/23   [name], Georgina Quint, MD  tamsulosin (FLOMAX) 0.4 MG CAPS capsule Take 0.4 mg by mouth daily.    [provider]      Allergies    Feraheme [ferumoxytol] and Vibramycin [doxycycline]    Review of Systems   Review of Systems  Psychiatric/Behavioral:  Positive for agitation.   All other systems reviewed and are negative.   Physical Exam Updated Vital Signs BP 109/69 (BP Location: Left Arm)   Pulse (!) 55   Temp 97.7 F (36.5 C) (Oral)   Resp 16   Wt 116.6 kg   SpO2 99%   BMI 34.86 kg/m  Physical Exam Vitals and nursing note reviewed.  Constitutional:      General: He is not in acute distress.    Appearance: Normal appearance.  HENT:     Head: Normocephalic and atraumatic.     Mouth/Throat:     Mouth: Mucous membranes are moist.  Eyes:     Conjunctiva/sclera: Conjunctivae normal.     Pupils: Pupils are equal, round, and reactive to light.  Cardiovascular:  Rate and Rhythm: Normal rate and regular rhythm.     Pulses: Normal pulses.  Pulmonary:     Effort: Pulmonary effort is normal.     Breath sounds: Normal breath sounds.  Abdominal:     Palpations: Abdomen is soft.     Tenderness: There is no abdominal tenderness.  Musculoskeletal:        General: No tenderness. Normal range of motion.     Cervical back: Normal range of motion.  Skin:    General: Skin is warm and dry.     Findings: No rash.  Neurological:     General: No focal deficit present.     Mental Status: He is alert. Mental status is at baseline.     Sensory: No sensory deficit.     Motor: No weakness.     Comments: Pleasantly demented  Psychiatric:        Mood and Affect: Mood normal.         Behavior: Behavior normal.    ED Results / Procedures / Treatments   Labs (all labs ordered are listed, but only abnormal results are displayed) Labs Reviewed  BASIC METABOLIC PANEL - Abnormal; Notable for the following components:      Result Value   Creatinine, Ser 0.41 (*)    All other components within normal limits  CBC WITH DIFFERENTIAL/PLATELET - Abnormal; Notable for the following components:   Hemoglobin 12.7 (*)    Monocytes Absolute 1.1 (*)    All other components within normal limits  VALPROIC ACID LEVEL - Abnormal; Notable for the following components:   Valproic Acid Lvl <10 (*)    All other components within normal limits  URINALYSIS, W/ REFLEX TO CULTURE (INFECTION SUSPECTED)    EKG None  Radiology No results found.  Procedures Procedures: not indicated.   Medications Ordered in ED Medications  ziprasidone (GEODON) injection 10 mg (10 mg Intramuscular Given 10/24/23 1014)  sterile water (preservative free) injection (10 mLs  Given 10/24/23 1014)    ED Course/ Medical Decision Making/ A&P                                 Medical Decision Making Amount and/or Complexity of Data Reviewed Labs: ordered.  Risk Prescription drug management.   This patient presents to the ED for concern of agitation, this involves an extensive number of treatment options, and is a complaint that carries with it a high risk of complications and morbidity.   Differential diagnosis includes: electrolyte derangement, UTI, agitation second to dementia, etc.   Comorbidities  See HPI above   Additional History  Additional history obtained from prior records   Lab Tests  I ordered and personally interpreted labs.  The pertinent results include:   BMP and CBC are reassuring UA is unremarkable Depakote level <10   Problem List / ED Course / Critical Interventions / Medication Management  Patiently pleasantly demented on initial evaluation. 30 minutes later was  notified by the nurse that he was getting agitated and cursing at staff. I ordered medications including: Geodon for agitation  Reevaluation of the patient after these medicines showed that the patient improved. On reevaluation, patient is back to baseline and resting comfortably in bed eating lunch, pleasantly demented .  No complaints or concerns at this time. Follow up with primary provider to make sure you're on the correct medications. Depakote level shows that this medication has not been taken  in a while.   Social Determinants of Health  Housing    Test / Admission - Considered  Patient is stable and safe discharge home. Return precautions provided.       Final Clinical Impression(s) / ED Diagnoses Final diagnoses:  Alzheimer's dementia with agitation, unspecified dementia severity, unspecified timing of dementia onset [facility])    Rx / DC Orders ED Discharge Orders     None         Maxwell Marion, PA-C 10/24/23 1509    Benjiman Core, MD 10/24/23 [phone]

## 2023-10-24 NOTE — ED Triage Notes (Signed)
 Patient BIB EMS from facility due to agitation  Facility states patient was combative  Patient has alzheimers Has not had medications this morning    Patient presents with no complaints

## 2023-10-28 DIAGNOSIS — R892 Abnormal level of other drugs, medicaments and biological substances in specimens from other organs, systems and tissues: Secondary | ICD-10-CM | POA: Diagnosis not present

## 2023-10-28 DIAGNOSIS — R451 Restlessness and agitation: Secondary | ICD-10-CM | POA: Diagnosis not present

## 2023-10-28 DIAGNOSIS — G309 Alzheimer's disease, unspecified: Secondary | ICD-10-CM | POA: Diagnosis not present

## 2023-11-04 DIAGNOSIS — Z79899 Other long term (current) drug therapy: Secondary | ICD-10-CM | POA: Diagnosis not present

## 2023-11-04 DIAGNOSIS — R451 Restlessness and agitation: Secondary | ICD-10-CM | POA: Diagnosis not present

## 2023-11-04 DIAGNOSIS — M6281 Muscle weakness (generalized): Secondary | ICD-10-CM | POA: Diagnosis not present

## 2023-11-04 DIAGNOSIS — G7000 Myasthenia gravis without (acute) exacerbation: Secondary | ICD-10-CM | POA: Diagnosis not present

## 2023-11-04 DIAGNOSIS — R634 Abnormal weight loss: Secondary | ICD-10-CM | POA: Diagnosis not present

## 2023-11-04 DIAGNOSIS — G309 Alzheimer's disease, unspecified: Secondary | ICD-10-CM | POA: Diagnosis not present

## 2023-11-06 DIAGNOSIS — Z79899 Other long term (current) drug therapy: Secondary | ICD-10-CM | POA: Diagnosis not present

## 2023-11-09 DIAGNOSIS — D696 Thrombocytopenia, unspecified: Secondary | ICD-10-CM | POA: Diagnosis not present

## 2023-11-09 DIAGNOSIS — Z515 Encounter for palliative care: Secondary | ICD-10-CM | POA: Diagnosis not present

## 2023-11-11 DIAGNOSIS — M6281 Muscle weakness (generalized): Secondary | ICD-10-CM | POA: Diagnosis not present

## 2023-11-11 DIAGNOSIS — Z9181 History of falling: Secondary | ICD-10-CM | POA: Diagnosis not present

## 2023-11-11 DIAGNOSIS — G309 Alzheimer's disease, unspecified: Secondary | ICD-10-CM | POA: Diagnosis not present

## 2023-11-17 DIAGNOSIS — Z9181 History of falling: Secondary | ICD-10-CM | POA: Diagnosis not present

## 2023-11-17 DIAGNOSIS — R2681 Unsteadiness on feet: Secondary | ICD-10-CM | POA: Diagnosis not present

## 2023-11-17 DIAGNOSIS — M6281 Muscle weakness (generalized): Secondary | ICD-10-CM | POA: Diagnosis not present

## 2023-11-18 DIAGNOSIS — M6281 Muscle weakness (generalized): Secondary | ICD-10-CM | POA: Diagnosis not present

## 2023-11-18 DIAGNOSIS — G309 Alzheimer's disease, unspecified: Secondary | ICD-10-CM | POA: Diagnosis not present

## 2023-11-18 DIAGNOSIS — R451 Restlessness and agitation: Secondary | ICD-10-CM | POA: Diagnosis not present

## 2023-11-18 DIAGNOSIS — Z9181 History of falling: Secondary | ICD-10-CM | POA: Diagnosis not present

## 2023-11-19 DIAGNOSIS — R1311 Dysphagia, oral phase: Secondary | ICD-10-CM | POA: Diagnosis not present

## 2023-11-19 DIAGNOSIS — R1312 Dysphagia, oropharyngeal phase: Secondary | ICD-10-CM | POA: Diagnosis not present

## 2023-11-24 DIAGNOSIS — M6281 Muscle weakness (generalized): Secondary | ICD-10-CM | POA: Diagnosis not present

## 2023-11-24 DIAGNOSIS — Z9181 History of falling: Secondary | ICD-10-CM | POA: Diagnosis not present

## 2023-11-25 DIAGNOSIS — L89312 Pressure ulcer of right buttock, stage 2: Secondary | ICD-10-CM | POA: Diagnosis not present

## 2023-11-25 DIAGNOSIS — M5451 Vertebrogenic low back pain: Secondary | ICD-10-CM | POA: Diagnosis not present

## 2023-11-27 DIAGNOSIS — R2681 Unsteadiness on feet: Secondary | ICD-10-CM | POA: Diagnosis not present

## 2023-11-27 DIAGNOSIS — M6281 Muscle weakness (generalized): Secondary | ICD-10-CM | POA: Diagnosis not present

## 2023-11-30 DIAGNOSIS — M6281 Muscle weakness (generalized): Secondary | ICD-10-CM | POA: Diagnosis not present

## 2023-11-30 DIAGNOSIS — Z9181 History of falling: Secondary | ICD-10-CM | POA: Diagnosis not present

## 2023-12-01 DIAGNOSIS — Z9181 History of falling: Secondary | ICD-10-CM | POA: Diagnosis not present

## 2023-12-01 DIAGNOSIS — R2681 Unsteadiness on feet: Secondary | ICD-10-CM | POA: Diagnosis not present

## 2023-12-01 DIAGNOSIS — M6281 Muscle weakness (generalized): Secondary | ICD-10-CM | POA: Diagnosis not present

## 2023-12-02 DIAGNOSIS — R609 Edema, unspecified: Secondary | ICD-10-CM | POA: Diagnosis not present

## 2023-12-07 DIAGNOSIS — Z79899 Other long term (current) drug therapy: Secondary | ICD-10-CM | POA: Diagnosis not present

## 2023-12-08 DIAGNOSIS — R2681 Unsteadiness on feet: Secondary | ICD-10-CM | POA: Diagnosis not present

## 2023-12-08 DIAGNOSIS — M6281 Muscle weakness (generalized): Secondary | ICD-10-CM | POA: Diagnosis not present

## 2023-12-10 DIAGNOSIS — M79675 Pain in left toe(s): Secondary | ICD-10-CM | POA: Diagnosis not present

## 2023-12-10 DIAGNOSIS — L603 Nail dystrophy: Secondary | ICD-10-CM | POA: Diagnosis not present

## 2023-12-10 DIAGNOSIS — B351 Tinea unguium: Secondary | ICD-10-CM | POA: Diagnosis not present

## 2023-12-10 DIAGNOSIS — Z9181 History of falling: Secondary | ICD-10-CM | POA: Diagnosis not present

## 2023-12-10 DIAGNOSIS — M79674 Pain in right toe(s): Secondary | ICD-10-CM | POA: Diagnosis not present

## 2023-12-10 DIAGNOSIS — M6281 Muscle weakness (generalized): Secondary | ICD-10-CM | POA: Diagnosis not present

## 2023-12-10 DIAGNOSIS — L851 Acquired keratosis [keratoderma] palmaris et plantaris: Secondary | ICD-10-CM | POA: Diagnosis not present

## 2023-12-10 DIAGNOSIS — R2681 Unsteadiness on feet: Secondary | ICD-10-CM | POA: Diagnosis not present

## 2023-12-14 DIAGNOSIS — Z9181 History of falling: Secondary | ICD-10-CM | POA: Diagnosis not present

## 2023-12-14 DIAGNOSIS — M6281 Muscle weakness (generalized): Secondary | ICD-10-CM | POA: Diagnosis not present

## 2023-12-17 DIAGNOSIS — R451 Restlessness and agitation: Secondary | ICD-10-CM | POA: Diagnosis not present

## 2023-12-17 DIAGNOSIS — G309 Alzheimer's disease, unspecified: Secondary | ICD-10-CM | POA: Diagnosis not present

## 2023-12-17 DIAGNOSIS — R2681 Unsteadiness on feet: Secondary | ICD-10-CM | POA: Diagnosis not present

## 2023-12-17 DIAGNOSIS — M6281 Muscle weakness (generalized): Secondary | ICD-10-CM | POA: Diagnosis not present

## 2023-12-23 DIAGNOSIS — M6281 Muscle weakness (generalized): Secondary | ICD-10-CM | POA: Diagnosis not present

## 2023-12-23 DIAGNOSIS — R451 Restlessness and agitation: Secondary | ICD-10-CM | POA: Diagnosis not present

## 2023-12-23 DIAGNOSIS — Z79899 Other long term (current) drug therapy: Secondary | ICD-10-CM | POA: Diagnosis not present

## 2023-12-23 DIAGNOSIS — M5451 Vertebrogenic low back pain: Secondary | ICD-10-CM | POA: Diagnosis not present

## 2023-12-23 DIAGNOSIS — G309 Alzheimer's disease, unspecified: Secondary | ICD-10-CM | POA: Diagnosis not present

## 2023-12-23 DIAGNOSIS — K219 Gastro-esophageal reflux disease without esophagitis: Secondary | ICD-10-CM | POA: Diagnosis not present

## 2023-12-23 DIAGNOSIS — R634 Abnormal weight loss: Secondary | ICD-10-CM | POA: Diagnosis not present

## 2023-12-23 DIAGNOSIS — G7000 Myasthenia gravis without (acute) exacerbation: Secondary | ICD-10-CM | POA: Diagnosis not present

## 2023-12-24 DIAGNOSIS — Z9181 History of falling: Secondary | ICD-10-CM | POA: Diagnosis not present

## 2023-12-24 DIAGNOSIS — M6281 Muscle weakness (generalized): Secondary | ICD-10-CM | POA: Diagnosis not present

## 2023-12-25 DIAGNOSIS — R2681 Unsteadiness on feet: Secondary | ICD-10-CM | POA: Diagnosis not present

## 2023-12-25 DIAGNOSIS — M6281 Muscle weakness (generalized): Secondary | ICD-10-CM | POA: Diagnosis not present

## 2023-12-29 DIAGNOSIS — R2681 Unsteadiness on feet: Secondary | ICD-10-CM | POA: Diagnosis not present

## 2023-12-29 DIAGNOSIS — M6281 Muscle weakness (generalized): Secondary | ICD-10-CM | POA: Diagnosis not present

## 2023-12-30 DIAGNOSIS — Z9181 History of falling: Secondary | ICD-10-CM | POA: Diagnosis not present

## 2023-12-30 DIAGNOSIS — M6281 Muscle weakness (generalized): Secondary | ICD-10-CM | POA: Diagnosis not present

## 2023-12-31 DIAGNOSIS — M6281 Muscle weakness (generalized): Secondary | ICD-10-CM | POA: Diagnosis not present

## 2023-12-31 DIAGNOSIS — Z515 Encounter for palliative care: Secondary | ICD-10-CM | POA: Diagnosis not present

## 2023-12-31 DIAGNOSIS — D696 Thrombocytopenia, unspecified: Secondary | ICD-10-CM | POA: Diagnosis not present

## 2023-12-31 DIAGNOSIS — R2681 Unsteadiness on feet: Secondary | ICD-10-CM | POA: Diagnosis not present

## 2024-01-05 DIAGNOSIS — M6281 Muscle weakness (generalized): Secondary | ICD-10-CM | POA: Diagnosis not present

## 2024-01-05 DIAGNOSIS — R2681 Unsteadiness on feet: Secondary | ICD-10-CM | POA: Diagnosis not present

## 2024-01-06 DIAGNOSIS — Z713 Dietary counseling and surveillance: Secondary | ICD-10-CM | POA: Diagnosis not present

## 2024-01-06 DIAGNOSIS — M6281 Muscle weakness (generalized): Secondary | ICD-10-CM | POA: Diagnosis not present

## 2024-01-06 DIAGNOSIS — R634 Abnormal weight loss: Secondary | ICD-10-CM | POA: Diagnosis not present

## 2024-01-06 DIAGNOSIS — G309 Alzheimer's disease, unspecified: Secondary | ICD-10-CM | POA: Diagnosis not present

## 2024-01-06 DIAGNOSIS — Z9181 History of falling: Secondary | ICD-10-CM | POA: Diagnosis not present

## 2024-01-07 DIAGNOSIS — R2681 Unsteadiness on feet: Secondary | ICD-10-CM | POA: Diagnosis not present

## 2024-01-07 DIAGNOSIS — M6281 Muscle weakness (generalized): Secondary | ICD-10-CM | POA: Diagnosis not present

## 2024-01-12 DIAGNOSIS — M6281 Muscle weakness (generalized): Secondary | ICD-10-CM | POA: Diagnosis not present

## 2024-01-12 DIAGNOSIS — R2681 Unsteadiness on feet: Secondary | ICD-10-CM | POA: Diagnosis not present

## 2024-01-13 DIAGNOSIS — R062 Wheezing: Secondary | ICD-10-CM | POA: Diagnosis not present

## 2024-01-13 DIAGNOSIS — K219 Gastro-esophageal reflux disease without esophagitis: Secondary | ICD-10-CM | POA: Diagnosis not present

## 2024-01-13 DIAGNOSIS — I517 Cardiomegaly: Secondary | ICD-10-CM | POA: Diagnosis not present

## 2024-01-13 DIAGNOSIS — R059 Cough, unspecified: Secondary | ICD-10-CM | POA: Diagnosis not present

## 2024-01-13 DIAGNOSIS — R0981 Nasal congestion: Secondary | ICD-10-CM | POA: Diagnosis not present

## 2024-01-13 DIAGNOSIS — G309 Alzheimer's disease, unspecified: Secondary | ICD-10-CM | POA: Diagnosis not present

## 2024-01-13 DIAGNOSIS — R0989 Other specified symptoms and signs involving the circulatory and respiratory systems: Secondary | ICD-10-CM | POA: Diagnosis not present

## 2024-01-14 DIAGNOSIS — G7000 Myasthenia gravis without (acute) exacerbation: Secondary | ICD-10-CM | POA: Diagnosis not present

## 2024-01-14 DIAGNOSIS — R0989 Other specified symptoms and signs involving the circulatory and respiratory systems: Secondary | ICD-10-CM | POA: Diagnosis not present

## 2024-01-14 DIAGNOSIS — G309 Alzheimer's disease, unspecified: Secondary | ICD-10-CM | POA: Diagnosis not present

## 2024-01-14 DIAGNOSIS — Z79899 Other long term (current) drug therapy: Secondary | ICD-10-CM | POA: Diagnosis not present

## 2024-01-14 DIAGNOSIS — R609 Edema, unspecified: Secondary | ICD-10-CM | POA: Diagnosis not present

## 2024-01-14 DIAGNOSIS — R059 Cough, unspecified: Secondary | ICD-10-CM | POA: Diagnosis not present

## 2024-01-14 DIAGNOSIS — I10 Essential (primary) hypertension: Secondary | ICD-10-CM | POA: Diagnosis not present

## 2024-01-19 DIAGNOSIS — M6281 Muscle weakness (generalized): Secondary | ICD-10-CM | POA: Diagnosis not present

## 2024-01-19 DIAGNOSIS — Z9181 History of falling: Secondary | ICD-10-CM | POA: Diagnosis not present

## 2024-01-19 DIAGNOSIS — R2681 Unsteadiness on feet: Secondary | ICD-10-CM | POA: Diagnosis not present

## 2024-01-20 DIAGNOSIS — R451 Restlessness and agitation: Secondary | ICD-10-CM | POA: Diagnosis not present

## 2024-01-20 DIAGNOSIS — G7000 Myasthenia gravis without (acute) exacerbation: Secondary | ICD-10-CM | POA: Diagnosis not present

## 2024-01-20 DIAGNOSIS — G309 Alzheimer's disease, unspecified: Secondary | ICD-10-CM | POA: Diagnosis not present

## 2024-01-20 DIAGNOSIS — K219 Gastro-esophageal reflux disease without esophagitis: Secondary | ICD-10-CM | POA: Diagnosis not present

## 2024-01-21 DIAGNOSIS — Z9181 History of falling: Secondary | ICD-10-CM | POA: Diagnosis not present

## 2024-01-21 DIAGNOSIS — M6281 Muscle weakness (generalized): Secondary | ICD-10-CM | POA: Diagnosis not present

## 2024-01-23 DIAGNOSIS — W19XXXA Unspecified fall, initial encounter: Secondary | ICD-10-CM | POA: Insufficient documentation

## 2024-01-23 DIAGNOSIS — Z743 Need for continuous supervision: Secondary | ICD-10-CM | POA: Diagnosis not present

## 2024-01-23 DIAGNOSIS — I10 Essential (primary) hypertension: Secondary | ICD-10-CM | POA: Diagnosis not present

## 2024-01-23 DIAGNOSIS — I6529 Occlusion and stenosis of unspecified carotid artery: Secondary | ICD-10-CM | POA: Diagnosis not present

## 2024-01-23 DIAGNOSIS — S0081XA Abrasion of other part of head, initial encounter: Secondary | ICD-10-CM | POA: Diagnosis not present

## 2024-01-23 DIAGNOSIS — M160 Bilateral primary osteoarthritis of hip: Secondary | ICD-10-CM | POA: Diagnosis not present

## 2024-01-23 DIAGNOSIS — S199XXA Unspecified injury of neck, initial encounter: Secondary | ICD-10-CM | POA: Diagnosis not present

## 2024-01-23 DIAGNOSIS — S0990XA Unspecified injury of head, initial encounter: Secondary | ICD-10-CM | POA: Diagnosis not present

## 2024-01-23 DIAGNOSIS — F0390 Unspecified dementia without behavioral disturbance: Secondary | ICD-10-CM | POA: Insufficient documentation

## 2024-01-23 DIAGNOSIS — G319 Degenerative disease of nervous system, unspecified: Secondary | ICD-10-CM | POA: Diagnosis not present

## 2024-01-23 DIAGNOSIS — R404 Transient alteration of awareness: Secondary | ICD-10-CM | POA: Diagnosis not present

## 2024-01-24 ENCOUNTER — Emergency Department (HOSPITAL_COMMUNITY)

## 2024-01-24 ENCOUNTER — Emergency Department (HOSPITAL_COMMUNITY)
Admission: EM | Admit: 2024-01-24 | Discharge: 2024-01-24 | Disposition: A | Discharge status: Home / Self Care | Attending: Emergency Medicine | Admitting: Emergency Medicine

## 2024-01-24 ENCOUNTER — Encounter (HOSPITAL_COMMUNITY): Payer: Self-pay

## 2024-01-24 DIAGNOSIS — I6529 Occlusion and stenosis of unspecified carotid artery: Secondary | ICD-10-CM | POA: Diagnosis not present

## 2024-01-24 DIAGNOSIS — S0990XA Unspecified injury of head, initial encounter: Secondary | ICD-10-CM | POA: Diagnosis not present

## 2024-01-24 DIAGNOSIS — M160 Bilateral primary osteoarthritis of hip: Secondary | ICD-10-CM | POA: Diagnosis not present

## 2024-01-24 DIAGNOSIS — G319 Degenerative disease of nervous system, unspecified: Secondary | ICD-10-CM | POA: Diagnosis not present

## 2024-01-24 DIAGNOSIS — I499 Cardiac arrhythmia, unspecified: Secondary | ICD-10-CM | POA: Diagnosis not present

## 2024-01-24 DIAGNOSIS — S199XXA Unspecified injury of neck, initial encounter: Secondary | ICD-10-CM | POA: Diagnosis not present

## 2024-01-24 DIAGNOSIS — Z7401 Bed confinement status: Secondary | ICD-10-CM | POA: Diagnosis not present

## 2024-01-24 DIAGNOSIS — R404 Transient alteration of awareness: Secondary | ICD-10-CM | POA: Diagnosis not present

## 2024-01-24 DIAGNOSIS — W19XXXA Unspecified fall, initial encounter: Secondary | ICD-10-CM

## 2024-01-24 DIAGNOSIS — Z743 Need for continuous supervision: Secondary | ICD-10-CM | POA: Diagnosis not present

## 2024-01-24 NOTE — ED Provider Notes (Signed)
 [place] EMERGENCY DEPARTMENT AT [facility] Provider Note   CSN: [id number] Arrival date & time: 01/23/24  2357     History  No chief complaint on file.   [name] is a 83 y.o. male.  The history is provided by the EMS personnel. The history is limited by the condition of the patient (dementia level 5 caveat).  Fall This is a new problem. The current episode started less than 1 hour ago. The problem occurs constantly. The problem has been resolved. Nothing aggravates the symptoms. Nothing relieves the symptoms.       Home Medications Prior to Admission medications   Medication Sig Start Date End Date Taking? Authorizing Provider  divalproex  (DEPAKOTE  SPRINKLE) 125 MG capsule Take 1 capsule (125 mg total) by mouth 3 (three) times daily. 08/29/23   Motley-Mangrum, Jadeka A, PMHNP  donepezil  (ARICEPT ) 10 MG tablet Take 1 tablet by mouth at bedtime 01/13/23   [name], Aleksei V, MD  famotidine  (PEPCID ) 40 MG tablet Take 1 tablet (40 mg total) by mouth at bedtime. 08/29/23   Motley-Mangrum, Jadeka A, PMHNP  finasteride  (PROSCAR ) 5 MG tablet Take 1 tablet by mouth every day 04/20/23   [name], Aleksei V, MD  furosemide  (LASIX ) 20 MG tablet Take 1 tablet (20 mg total) by mouth daily. 08/30/23   Motley-Mangrum, Jadeka A, PMHNP  Melatonin 10 MG CAPS Take 10 mg by mouth at bedtime.    [provider]  memantine  (NAMENDA ) 10 MG tablet Take 1 tablet by mouth twice daily 06/23/23   [name], Aleksei V, MD  mycophenolate  (CELLCEPT ) 500 MG tablet Take 1,000 mg by mouth 2 (two) times daily.    [provider]  potassium chloride  (KLOR-CON ) 10 MEQ tablet Take 1 tablet by mouth twice daily 06/12/23   [name], Aleksei V, MD  pyridostigmine  (MESTINON ) 60 MG tablet Take 60 mg by mouth 2 (two) times daily.    [provider]  QUEtiapine  (SEROQUEL  XR) 200 MG 24 hr tablet Take 1 tablet (200 mg total) by mouth at bedtime. 08/29/23   Motley-Mangrum, Jadeka A,  PMHNP  QUEtiapine  (SEROQUEL ) 50 MG tablet Take 2 tablets (100 mg total) by mouth at bedtime. 08/14/23   Ninetta Basket, MD  sertraline  (ZOLOFT ) 100 MG tablet Take 1 tablet by mouth every morning 05/24/23   [name], Aleksei V, MD  tamsulosin  (FLOMAX ) 0.4 MG CAPS capsule Take 0.4 mg by mouth daily.    [provider]      Allergies    Feraheme  [ferumoxytol ] and Vibramycin  [doxycycline ]    Review of Systems   Review of Systems  Physical Exam Updated Vital Signs BP 123/67   Pulse (!) 46   Temp 98.6 F (37 C)   Resp 14   SpO2 94%  Physical Exam  ED Results / Procedures / Treatments   Labs (all labs ordered are listed, but only abnormal results are displayed) Labs Reviewed - No data to display  EKG EKG Interpretation Date/Time:  Sunday January 24 2024 00:05:10 EDT Ventricular Rate:  46 PR Interval:    QRS Duration:  106 QT Interval:  479 QTC Calculation: 419 R Axis:   51  Text Interpretation: Normal sinus rhythm Abnormal R-wave progression, early transition Baseline wander in lead(s) V1 V3 Confirmed by Maralee Senate, [facility] ([id number]) on 01/24/2024 1:37:54 AM  Radiology CT Head Wo Contrast Result Date: 01/24/2024 CLINICAL DATA:  Blunt poly trauma.  Fall injury. EXAM: CT HEAD WITHOUT CONTRAST CT CERVICAL SPINE WITHOUT CONTRAST TECHNIQUE: Multidetector CT  imaging of the head and cervical spine was performed following the standard protocol without intravenous contrast. Multiplanar CT image reconstructions of the cervical spine were also generated. RADIATION DOSE REDUCTION: This exam was performed according to the departmental dose-optimization program which includes automated exposure control, adjustment of the mA and/or kV according to patient size and/or use of iterative reconstruction technique. COMPARISON:  CT scan head 08/16/2023, CT soft tissue neck study and reconstructions 12/03/2017. FINDINGS: CT HEAD FINDINGS Brain: Right-sided cochlear implant creates spray artifact partially  obscuring right parietotemporal area. There is moderately advanced cerebral and mild cerebellar atrophy, with atrophic ventriculomegaly and advanced chronic small vessel disease of the cerebral white matter. No cortical based acute infarct, hemorrhage, mass effect or midline shift are seen accounting for metallic artifacts. Vascular: Patchy calcification noted both siphons. No hyperdense central vessel is seen. Skull: Negative for fractures or focal lesions. Sinuses/Orbits: Right mastoidectomy and cochlear implant. There is chronic fluid in the right mastoid tip. The left mastoids and paranasal sinuses are clear. Old lens extractions. Otherwise negative orbits. Other: None. CT CERVICAL SPINE FINDINGS Alignment: There is a straightened cervical lordosis chronically noted, with a 2 mm grade 1 anterolisthesis C3-4 and C7-T1. There is no new or progressive alignment abnormality, no traumatic listhesis, and no widening of the anterior atlantodental joint. Skull base and vertebrae: There is osteopenia without evidence of fractures or primary pathologic process. Soft tissues and spinal canal: No prevertebral fluid or swelling. No visible canal hematoma. There are mild calcifications in the proximal cervical ICAs. No thyroid  or laryngeal mass. Disc levels: The discs are diffusely degenerated, the greatest disc collapse from C4-5 down through the lowest level imaged at T3-4. There are bulky anterior osteophytes, with bridging at C5-6 and attempted bridging at C4-5, C6-7, C7-T1. Posterior endplate spurring is also present at most levels encroaching on the cord surface with the greatest ventral cord encroachment by posterior osteophytes C3-4, C5-6 and C6-7. Prominent facet osteophytes left greater than right are also seen at most levels, along with uncinate spurring. Acquired foraminal stenosis is moderate to severe on the left at C2-3, is bilaterally severe C3-4, severe on the right and mild on the left C4-5, severe on the  left and mild on the right C5-6, bilaterally moderate to severe C6-7. Upper chest: Negative. Other: There are dystrophic nuchal ligament calcifications at C5 and 6. IMPRESSION: 1. No acute intracranial CT findings or depressed skull fractures. 2. Atrophy and chronic small vessel disease. Portions of the right hemisphere obscured by cochlear implant. 3. Osteopenia and degenerative change without evidence of cervical fractures. 4. Straightened cervical lordosis with 2 mm grade 1 anterolisthesis C3-4 and C7-T1, chronic. 5. Multilevel cervical spinal canal and foraminal stenosis due to disc and facet disease. 6. Cervical ICA atherosclerosis. Electronically Signed   By: Denman Fischer M.D.   On: 01/24/2024 01:30   CT Cervical Spine Wo Contrast Result Date: 01/24/2024 CLINICAL DATA:  Blunt poly trauma.  Fall injury. EXAM: CT HEAD WITHOUT CONTRAST CT CERVICAL SPINE WITHOUT CONTRAST TECHNIQUE: Multidetector CT imaging of the head and cervical spine was performed following the standard protocol without intravenous contrast. Multiplanar CT image reconstructions of the cervical spine were also generated. RADIATION DOSE REDUCTION: This exam was performed according to the departmental dose-optimization program which includes automated exposure control, adjustment of the mA and/or kV according to patient size and/or use of iterative reconstruction technique. COMPARISON:  CT scan head 08/16/2023, CT soft tissue neck study and reconstructions 12/03/2017. FINDINGS: CT HEAD FINDINGS Brain: Right-sided  cochlear implant creates spray artifact partially obscuring right parietotemporal area. There is moderately advanced cerebral and mild cerebellar atrophy, with atrophic ventriculomegaly and advanced chronic small vessel disease of the cerebral white matter. No cortical based acute infarct, hemorrhage, mass effect or midline shift are seen accounting for metallic artifacts. Vascular: Patchy calcification noted both siphons. No  hyperdense central vessel is seen. Skull: Negative for fractures or focal lesions. Sinuses/Orbits: Right mastoidectomy and cochlear implant. There is chronic fluid in the right mastoid tip. The left mastoids and paranasal sinuses are clear. Old lens extractions. Otherwise negative orbits. Other: None. CT CERVICAL SPINE FINDINGS Alignment: There is a straightened cervical lordosis chronically noted, with a 2 mm grade 1 anterolisthesis C3-4 and C7-T1. There is no new or progressive alignment abnormality, no traumatic listhesis, and no widening of the anterior atlantodental joint. Skull base and vertebrae: There is osteopenia without evidence of fractures or primary pathologic process. Soft tissues and spinal canal: No prevertebral fluid or swelling. No visible canal hematoma. There are mild calcifications in the proximal cervical ICAs. No thyroid  or laryngeal mass. Disc levels: The discs are diffusely degenerated, the greatest disc collapse from C4-5 down through the lowest level imaged at T3-4. There are bulky anterior osteophytes, with bridging at C5-6 and attempted bridging at C4-5, C6-7, C7-T1. Posterior endplate spurring is also present at most levels encroaching on the cord surface with the greatest ventral cord encroachment by posterior osteophytes C3-4, C5-6 and C6-7. Prominent facet osteophytes left greater than right are also seen at most levels, along with uncinate spurring. Acquired foraminal stenosis is moderate to severe on the left at C2-3, is bilaterally severe C3-4, severe on the right and mild on the left C4-5, severe on the left and mild on the right C5-6, bilaterally moderate to severe C6-7. Upper chest: Negative. Other: There are dystrophic nuchal ligament calcifications at C5 and 6. IMPRESSION: 1. No acute intracranial CT findings or depressed skull fractures. 2. Atrophy and chronic small vessel disease. Portions of the right hemisphere obscured by cochlear implant. 3. Osteopenia and degenerative  change without evidence of cervical fractures. 4. Straightened cervical lordosis with 2 mm grade 1 anterolisthesis C3-4 and C7-T1, chronic. 5. Multilevel cervical spinal canal and foraminal stenosis due to disc and facet disease. 6. Cervical ICA atherosclerosis. Electronically Signed   By: Denman Fischer M.D.   On: 01/24/2024 01:30   DG Hips Bilat W or Wo Pelvis 3-4 Views Result Date: 01/24/2024 CLINICAL DATA:  Unwitnessed fall with coccygeal pain. EXAM: DG HIP (WITH OR WITHOUT PELVIS) 3-4V BILAT COMPARISON:  None Available. FINDINGS: Postoperative changes in the lower lumbar spine. Prominent degenerative changes in both hips. Pelvis and hips appear intact. No evidence of acute fracture or dislocation. SI joints and symphysis pubis are not displaced. No focal bone lesion or bone destruction. IMPRESSION: Prominent degenerative changes in the hips. No acute displaced fractures identified. Electronically Signed   By: Boyce Byes M.D.   On: 01/24/2024 00:40    Procedures Procedures    Medications Ordered in ED Medications - No data to display  ED Course/ Medical Decision Making/ A&P                                 Medical Decision Making Fall at [facility] home   Amount and/or Complexity of Data Reviewed Independent Historian: EMS    Details: See above  External Data Reviewed: notes.    Details: Previous notes  reviewed  Radiology: ordered and independent interpretation performed.    Details: Negative CT ECG/medicine tests: ordered and independent interpretation performed. Decision-making details documented in ED Course.  Risk Risk Details: Negative CTs, stable for discharge. Strict returns     Final Clinical Impression(s) / ED Diagnoses Final diagnoses:  Fall, initial encounter   No signs of systemic illness or infection. The patient is nontoxic-appearing on exam and vital signs are within normal limits.  I have reviewed the triage vital signs and the nursing notes. Pertinent labs &  imaging results that were available during my care of the patient were reviewed by me and considered in my medical decision making (see chart for details). After history, exam, and medical workup I feel the patient has been appropriately medically screened and is safe for discharge home. Pertinent diagnoses were discussed with the patient. Patient was given return precautions. Rx / DC Orders ED Discharge Orders     None         Graison Leinberger, MD 01/24/24 [phone]

## 2024-01-24 NOTE — ED Triage Notes (Signed)
 Pt comes via [facility] from [facility] after a fall, unwitnessed, c/o of pain to his coccyx, pt has abrasion to R forehead. Hx of dementia

## 2024-01-24 NOTE — ED Notes (Signed)
 This RN called [facility] for patient transport.

## 2024-01-24 NOTE — ED Notes (Signed)
 Pt HR been sat around 40"S whole stay , RR dropped . Repeated EKG and repositioned pt . Pt currently sleeping. Vitals stable.

## 2024-01-24 NOTE — ED Notes (Signed)
 Pt up watching tv , no complaints

## 2024-01-24 NOTE — ED Notes (Signed)
 Pt was transported to CT , Visitor at bedside.

## 2024-01-25 DIAGNOSIS — Z9181 History of falling: Secondary | ICD-10-CM | POA: Diagnosis not present

## 2024-01-25 DIAGNOSIS — M6281 Muscle weakness (generalized): Secondary | ICD-10-CM | POA: Diagnosis not present

## 2024-01-27 DIAGNOSIS — M6281 Muscle weakness (generalized): Secondary | ICD-10-CM | POA: Diagnosis not present

## 2024-01-27 DIAGNOSIS — S0083XA Contusion of other part of head, initial encounter: Secondary | ICD-10-CM | POA: Diagnosis not present

## 2024-01-27 DIAGNOSIS — S80819A Abrasion, unspecified lower leg, initial encounter: Secondary | ICD-10-CM | POA: Diagnosis not present

## 2024-01-27 DIAGNOSIS — W1830XA Fall on same level, unspecified, initial encounter: Secondary | ICD-10-CM | POA: Diagnosis not present

## 2024-01-27 DIAGNOSIS — S50319A Abrasion of unspecified elbow, initial encounter: Secondary | ICD-10-CM | POA: Diagnosis not present

## 2024-01-27 DIAGNOSIS — Z9181 History of falling: Secondary | ICD-10-CM | POA: Diagnosis not present

## 2024-02-01 DIAGNOSIS — Z9181 History of falling: Secondary | ICD-10-CM | POA: Diagnosis not present

## 2024-02-01 DIAGNOSIS — M6281 Muscle weakness (generalized): Secondary | ICD-10-CM | POA: Diagnosis not present

## 2024-02-03 DIAGNOSIS — G309 Alzheimer's disease, unspecified: Secondary | ICD-10-CM | POA: Diagnosis not present

## 2024-02-03 DIAGNOSIS — Z79899 Other long term (current) drug therapy: Secondary | ICD-10-CM | POA: Diagnosis not present

## 2024-02-03 DIAGNOSIS — Z9181 History of falling: Secondary | ICD-10-CM | POA: Diagnosis not present

## 2024-02-03 DIAGNOSIS — M21252 Flexion deformity, left hip: Secondary | ICD-10-CM | POA: Diagnosis not present

## 2024-02-03 DIAGNOSIS — R2242 Localized swelling, mass and lump, left lower limb: Secondary | ICD-10-CM | POA: Diagnosis not present

## 2024-02-03 DIAGNOSIS — M6281 Muscle weakness (generalized): Secondary | ICD-10-CM | POA: Diagnosis not present

## 2024-02-03 DIAGNOSIS — M25552 Pain in left hip: Secondary | ICD-10-CM | POA: Diagnosis not present

## 2024-02-04 DIAGNOSIS — M25552 Pain in left hip: Secondary | ICD-10-CM | POA: Diagnosis not present

## 2024-02-10 DIAGNOSIS — M21252 Flexion deformity, left hip: Secondary | ICD-10-CM | POA: Diagnosis not present

## 2024-02-10 DIAGNOSIS — R2242 Localized swelling, mass and lump, left lower limb: Secondary | ICD-10-CM | POA: Diagnosis not present

## 2024-02-10 DIAGNOSIS — Z9181 History of falling: Secondary | ICD-10-CM | POA: Diagnosis not present

## 2024-02-10 DIAGNOSIS — Z713 Dietary counseling and surveillance: Secondary | ICD-10-CM | POA: Diagnosis not present

## 2024-02-10 DIAGNOSIS — M6281 Muscle weakness (generalized): Secondary | ICD-10-CM | POA: Diagnosis not present

## 2024-02-10 DIAGNOSIS — M25552 Pain in left hip: Secondary | ICD-10-CM | POA: Diagnosis not present

## 2024-02-11 DIAGNOSIS — M25462 Effusion, left knee: Secondary | ICD-10-CM | POA: Diagnosis not present

## 2024-02-11 DIAGNOSIS — R2232 Localized swelling, mass and lump, left upper limb: Secondary | ICD-10-CM | POA: Diagnosis not present

## 2024-02-15 DIAGNOSIS — L603 Nail dystrophy: Secondary | ICD-10-CM | POA: Diagnosis not present

## 2024-02-15 DIAGNOSIS — R2681 Unsteadiness on feet: Secondary | ICD-10-CM | POA: Diagnosis not present

## 2024-02-15 DIAGNOSIS — M79674 Pain in right toe(s): Secondary | ICD-10-CM | POA: Diagnosis not present

## 2024-02-15 DIAGNOSIS — L84 Corns and callosities: Secondary | ICD-10-CM | POA: Diagnosis not present

## 2024-02-15 DIAGNOSIS — Z9181 History of falling: Secondary | ICD-10-CM | POA: Diagnosis not present

## 2024-02-15 DIAGNOSIS — M6281 Muscle weakness (generalized): Secondary | ICD-10-CM | POA: Diagnosis not present

## 2024-02-15 DIAGNOSIS — M79675 Pain in left toe(s): Secondary | ICD-10-CM | POA: Diagnosis not present

## 2024-02-15 DIAGNOSIS — B351 Tinea unguium: Secondary | ICD-10-CM | POA: Diagnosis not present

## 2024-02-16 DIAGNOSIS — R2242 Localized swelling, mass and lump, left lower limb: Secondary | ICD-10-CM | POA: Diagnosis not present

## 2024-02-16 DIAGNOSIS — M25552 Pain in left hip: Secondary | ICD-10-CM | POA: Diagnosis not present

## 2024-02-16 DIAGNOSIS — M25452 Effusion, left hip: Secondary | ICD-10-CM | POA: Diagnosis not present

## 2024-02-17 DIAGNOSIS — R2242 Localized swelling, mass and lump, left lower limb: Secondary | ICD-10-CM | POA: Diagnosis not present

## 2024-02-18 DIAGNOSIS — M6281 Muscle weakness (generalized): Secondary | ICD-10-CM | POA: Diagnosis not present

## 2024-02-18 DIAGNOSIS — Z9181 History of falling: Secondary | ICD-10-CM | POA: Diagnosis not present

## 2024-02-23 DIAGNOSIS — R1312 Dysphagia, oropharyngeal phase: Secondary | ICD-10-CM | POA: Diagnosis not present

## 2024-02-23 DIAGNOSIS — R1311 Dysphagia, oral phase: Secondary | ICD-10-CM | POA: Diagnosis not present

## 2024-02-25 DIAGNOSIS — R1312 Dysphagia, oropharyngeal phase: Secondary | ICD-10-CM | POA: Diagnosis not present

## 2024-02-25 DIAGNOSIS — R1311 Dysphagia, oral phase: Secondary | ICD-10-CM | POA: Diagnosis not present

## 2024-03-02 DIAGNOSIS — R1311 Dysphagia, oral phase: Secondary | ICD-10-CM | POA: Diagnosis not present

## 2024-03-02 DIAGNOSIS — R1312 Dysphagia, oropharyngeal phase: Secondary | ICD-10-CM | POA: Diagnosis not present

## 2024-03-08 DIAGNOSIS — R1311 Dysphagia, oral phase: Secondary | ICD-10-CM | POA: Diagnosis not present

## 2024-03-08 DIAGNOSIS — R1312 Dysphagia, oropharyngeal phase: Secondary | ICD-10-CM | POA: Diagnosis not present

## 2024-03-14 ENCOUNTER — Ambulatory Visit: Payer: Medicare Other

## 2024-03-15 DIAGNOSIS — R1312 Dysphagia, oropharyngeal phase: Secondary | ICD-10-CM | POA: Diagnosis not present

## 2024-03-15 DIAGNOSIS — R1311 Dysphagia, oral phase: Secondary | ICD-10-CM | POA: Diagnosis not present

## 2024-03-17 ENCOUNTER — Emergency Department (HOSPITAL_COMMUNITY)

## 2024-03-17 ENCOUNTER — Emergency Department (HOSPITAL_COMMUNITY)
Admission: EM | Admit: 2024-03-17 | Discharge: 2024-03-17 | Disposition: A | Discharge status: Home / Self Care | Attending: Emergency Medicine | Admitting: Emergency Medicine

## 2024-03-17 ENCOUNTER — Encounter (HOSPITAL_COMMUNITY): Payer: Self-pay | Admitting: Emergency Medicine

## 2024-03-17 ENCOUNTER — Other Ambulatory Visit: Payer: Self-pay

## 2024-03-17 DIAGNOSIS — K5790 Diverticulosis of intestine, part unspecified, without perforation or abscess without bleeding: Secondary | ICD-10-CM | POA: Insufficient documentation

## 2024-03-17 DIAGNOSIS — Z043 Encounter for examination and observation following other accident: Secondary | ICD-10-CM | POA: Diagnosis not present

## 2024-03-17 DIAGNOSIS — K4020 Bilateral inguinal hernia, without obstruction or gangrene, not specified as recurrent: Secondary | ICD-10-CM | POA: Diagnosis not present

## 2024-03-17 DIAGNOSIS — R404 Transient alteration of awareness: Secondary | ICD-10-CM | POA: Diagnosis not present

## 2024-03-17 DIAGNOSIS — S199XXA Unspecified injury of neck, initial encounter: Secondary | ICD-10-CM | POA: Diagnosis not present

## 2024-03-17 DIAGNOSIS — M4802 Spinal stenosis, cervical region: Secondary | ICD-10-CM | POA: Diagnosis not present

## 2024-03-17 DIAGNOSIS — Z743 Need for continuous supervision: Secondary | ICD-10-CM | POA: Diagnosis not present

## 2024-03-17 DIAGNOSIS — R2242 Localized swelling, mass and lump, left lower limb: Secondary | ICD-10-CM | POA: Diagnosis not present

## 2024-03-17 DIAGNOSIS — M47812 Spondylosis without myelopathy or radiculopathy, cervical region: Secondary | ICD-10-CM | POA: Diagnosis not present

## 2024-03-17 DIAGNOSIS — M4312 Spondylolisthesis, cervical region: Secondary | ICD-10-CM | POA: Diagnosis not present

## 2024-03-17 DIAGNOSIS — W1830XA Fall on same level, unspecified, initial encounter: Secondary | ICD-10-CM | POA: Diagnosis not present

## 2024-03-17 DIAGNOSIS — W19XXXA Unspecified fall, initial encounter: Secondary | ICD-10-CM

## 2024-03-17 DIAGNOSIS — K5730 Diverticulosis of large intestine without perforation or abscess without bleeding: Secondary | ICD-10-CM | POA: Diagnosis not present

## 2024-03-17 DIAGNOSIS — Z7401 Bed confinement status: Secondary | ICD-10-CM | POA: Diagnosis not present

## 2024-03-17 DIAGNOSIS — I700 Atherosclerosis of aorta: Secondary | ICD-10-CM | POA: Insufficient documentation

## 2024-03-17 DIAGNOSIS — F0390 Unspecified dementia without behavioral disturbance: Secondary | ICD-10-CM | POA: Diagnosis not present

## 2024-03-17 DIAGNOSIS — S51011A Laceration without foreign body of right elbow, initial encounter: Secondary | ICD-10-CM | POA: Diagnosis not present

## 2024-03-17 DIAGNOSIS — I959 Hypotension, unspecified: Secondary | ICD-10-CM | POA: Diagnosis not present

## 2024-03-17 DIAGNOSIS — R220 Localized swelling, mass and lump, head: Secondary | ICD-10-CM | POA: Diagnosis not present

## 2024-03-17 DIAGNOSIS — I6523 Occlusion and stenosis of bilateral carotid arteries: Secondary | ICD-10-CM | POA: Diagnosis not present

## 2024-03-17 MED ORDER — BACITRACIN ZINC 500 UNIT/GM EX OINT
TOPICAL_OINTMENT | Freq: Two times a day (BID) | CUTANEOUS | Status: DC
  Filled 2024-03-17: qty 1.8

## 2024-03-17 NOTE — Discharge Instructions (Signed)
 Follow-up with your doctor as needed.

## 2024-03-17 NOTE — ED Provider Notes (Signed)
 [place] EMERGENCY DEPARTMENT AT [facility] Provider Note   CSN: [id number] Arrival date & time: 03/17/24  0240     Patient presents with: [name]   [name] is [name] 83 y.o. male.   Patient from [facility] after fall earlier today. Wife at bedside who provides history for the patient with advanced dementia. He has complained of pain in the left hip but has been ambulatory since the fall. His wife reports [name] growth on that area for the past several months but he does not complain of pain. He is currently being referred to [facility] for same. He has [name] wound to the right elbow but full movement of the arm without complaint. Not anticoagulated.   The history is provided by the spouse. No language interpreter was used.  Fall       Prior to Admission medications   Medication Sig Start Date End Date Taking? Authorizing Provider  divalproex  (DEPAKOTE  SPRINKLE) 125 MG capsule Take 1 capsule (125 mg total) by mouth 3 (three) times daily. 08/29/23   [name], PMHNP  donepezil  (ARICEPT ) 10 MG tablet Take 1 tablet by mouth at bedtime 01/13/23   [name], Aleksei V, MD  famotidine  (PEPCID ) 40 MG tablet Take 1 tablet (40 mg total) by mouth at bedtime. 08/29/23   [name], PMHNP  finasteride  (PROSCAR ) 5 MG tablet Take 1 tablet by mouth every day 04/20/23   [name], Aleksei V, MD  furosemide  (LASIX ) 20 MG tablet Take 1 tablet (20 mg total) by mouth daily. 08/30/23   [name], PMHNP  Melatonin 10 MG CAPS Take 10 mg by mouth at bedtime.    [provider]  memantine  (NAMENDA ) 10 MG tablet Take 1 tablet by mouth twice daily 06/23/23   [name], Aleksei V, MD  mycophenolate  (CELLCEPT ) 500 MG tablet Take 1,000 mg by mouth 2 (two) times daily.    [provider]  potassium chloride  (KLOR-CON ) 10 MEQ tablet Take 1 tablet by mouth twice daily 06/12/23   [name], Aleksei V, MD  pyridostigmine  (MESTINON ) 60 MG  tablet Take 60 mg by mouth 2 (two) times daily.    [provider]  QUEtiapine  (SEROQUEL  XR) 200 MG 24 hr tablet Take 1 tablet (200 mg total) by mouth at bedtime. 08/29/23   [name], PMHNP  QUEtiapine  (SEROQUEL ) 50 MG tablet Take 2 tablets (100 mg total) by mouth at bedtime. 08/14/23   Lee Lamar BROCKS, MD  sertraline  (ZOLOFT ) 100 MG tablet Take 1 tablet by mouth every morning 05/24/23   [name], Aleksei V, MD  tamsulosin  (FLOMAX ) 0.4 MG CAPS capsule Take 0.4 mg by mouth daily.    [provider]    Allergies: Feraheme  [ferumoxytol ] and Vibramycin  [doxycycline ]    Review of Systems  Updated Vital Signs BP 129/76   Pulse 68   Temp 98.6 F (37 C)   Resp 18   SpO2 97%   Physical Exam Vitals and nursing note reviewed.  Constitutional:      Appearance: He is well-developed.  HENT:     Head: Normocephalic and atraumatic.  Cardiovascular:     Rate and Rhythm: Normal rate.  Pulmonary:     Effort: Pulmonary effort is normal.  Abdominal:     Palpations: Abdomen is soft.     Tenderness: There is no abdominal tenderness. There is no guarding or rebound.  Musculoskeletal:        General: Normal range of motion.  Cervical back: Normal range of motion and neck supple.     Comments: Large firm, non-mobile, nontender mass to lateral left hip. No discoloration. No deformity. Right elbow without bony deformity. Moves through full range of motion without limitation.   Skin:    General: Skin is warm and dry.     Comments: Shallow skin tear to right elbow, minimal.   Neurological:     General: No focal deficit present.     Mental Status: He is alert and oriented to person, place, and time.     (all labs ordered are listed, but only abnormal results are displayed) Labs Reviewed - No data to display  EKG: None  Radiology: CT PELVIS WO CONTRAST Result Date: 03/17/2024 CLINICAL DATA:  [name] and hit head, with hip trauma. Fracture suspected. EXAM: CT  PELVIS WITHOUT CONTRAST TECHNIQUE: Multidetector CT imaging of the pelvis was performed following the standard protocol without intravenous contrast. RADIATION DOSE REDUCTION: This exam was performed according to the departmental dose-optimization program which includes automated exposure control, adjustment of the mA and/or kV according to patient size and/or use of iterative reconstruction technique. COMPARISON:  CT abdomen and pelvis with contrast 08/11/2023 and 03/06/2014. FINDINGS: Urinary Tract: Pelvic ureters are small in caliber. No ureteral stone is seen. The bladder unremarkable for its degree of distention. Bowel: No dilatation or wall thickening. The appendix is not fully seen but the visualized portion is normal. There is sigmoid diverticulosis without evidence of diverticulitis. Vascular/Lymphatic: Patchy aortoiliac atherosclerosis. No adenopathy is seen. No distal AAA. Reproductive: No prostatomegaly. Both testicles in the scrotal sac. Other: Bilateral inguinal fat hernias. No incarcerated hernia. No free fluid or free air. Musculoskeletal: There is [name] subcutaneous fluid collection lateral to the left greater trochanter measuring 6.6 x 4.4 cm and 22 Hounsfield units and not seen previously. Its outer wall is fairly uniform thickness 2-3 mm without visible wall complexity. This is probably [name] subacute hematoma, seroma, or less likely trochanteric bursitis. Infectious collection is not strictly excluded. There is no underlying destructive lesion of the left greater trochanter. There is osteopenia. The there is no evidence of fractures of visualized lower lumbar segments, sacrum, bony pelvis and proximal femurs. No hip dislocation. There is ankylosis across the right SI joint, and chronic partially visible L3-S1 dorsal fusion hardware with solid interbody fusions and laminectomy defects posteriorly. There is mild-to-moderate right and mild left hip DJD, spurring at the left SI joint and symphysis. There  are enthesopathic changes of the pelvis and both greater trochanters. No erosive arthropathy. IMPRESSION: 1. No evidence of fractures. 2. 6.6 x 4.4 cm subcutaneous fluid collection lateral to the left greater trochanter, probably [name] subacute hematoma, seroma, or less likely trochanteric bursitis. Infectious collection is not strictly excluded. 3. Osteopenia and degenerative and postoperative changes. 4. Aortic atherosclerosis. 5. Diverticulosis without evidence of diverticulitis. 6. Bilateral inguinal fat hernias. Aortic Atherosclerosis (ICD10-I70.0). Electronically Signed   By: Francis Quam M.D.   On: 03/17/2024 04:57   CT Cervical Spine Wo Contrast Result Date: 03/17/2024 EXAM: CT CERVICAL SPINE WITHOUT CONTRAST 03/17/2024 04:22:00 AM TECHNIQUE: CT of the cervical spine was performed without the administration of intravenous contrast. Multiplanar reformatted images are provided for review. Automated exposure control, iterative reconstruction, and/or weight based adjustment of the mA/kV was utilized to reduce the radiation dose to as low as reasonably achievable. COMPARISON: CT of the cervical spine 01/24/2024 CLINICAL HISTORY: Polytrauma, blunt. witnessed fall. Pt did hit his head, no thinners, no LOC. Hx of dementia FINDINGS:  CERVICAL SPINE: BONES AND ALIGNMENT: No acute fracture or traumatic malalignment. Straightening of the normal cervical lordosis is stable. Slight degenerative anterolisthesis at C3-4 and C7-T1 is stable. DEGENERATIVE CHANGES: Multilevel degenerative changes are again noted with moderate left foraminal narrowing at C3-4 and C5-6. Moderate-to-severe bilateral foraminal stenosis is present at C4-5, C6-7 and C7-T1. SOFT TISSUES: No prevertebral soft tissue swelling. Atherosclerotic calcifications are present at the carotid bifurcations bilaterally without definite stenosis. IMPRESSION: 1. No acute abnormality of the cervical spine related to the reported polytrauma. 2. Stable straightening  of the normal cervical lordosis and slight degenerative anterolisthesis at C3-4 and C7-T1. 3. Multilevel degenerative changes with moderate left foraminal narrowing at C3-4 and C5-6, and moderate-to-severe bilateral foraminal stenosis at C4-5, C6-7, and C7-T1. Electronically signed by: Lonni Necessary MD 03/17/2024 04:40 AM EDT RP Workstation: [id number]   CT Head Wo Contrast Result Date: 03/17/2024 EXAM: CT HEAD WITHOUT CONTRAST 03/17/2024 04:22:00 AM TECHNIQUE: CT of the head was performed without the administration of intravenous contrast. Automated exposure control, iterative reconstruction, and/or weight based adjustment of the mA/kV was utilized to reduce the radiation dose to as low as reasonably achievable. COMPARISON: 01/24/2024 CLINICAL HISTORY: Syncope/presyncope, cerebrovascular cause suspected. witnessed fall. Pt did hit his head, no thinners, no LOC. Hx of dementia FINDINGS: BRAIN AND VENTRICLES: Mild atrophy and moderate white matter changes are stable. No acute hemorrhage. Gray-white differentiation is preserved. No hydrocephalus. No extra-axial collection. No mass effect or midline shift. ORBITS: Bilateral lens replacements are noted. The globes and orbits are otherwise within normal limits. SINUSES: No acute abnormality. SOFT TISSUES AND SKULL: Left supraorbital soft tissue swelling is present without underlying fracture or foreign body. No skull fracture. VASCULATURE: Atherosclerotic calcifications are present in the cavernous carotid arteries bilaterally. No hyperdense vessel is present. Artifact from right ventricular implant is again noted. IMPRESSION: 1. No acute intracranial abnormality. 2. Stable mild atrophy and moderate white matter changes. 3. Left supraorbital soft tissue swelling without underlying fracture or foreign body. Electronically signed by: Lonni Necessary MD 03/17/2024 04:36 AM EDT RP Workstation: [id number]     Procedures   Medications Ordered in the ED   bacitracin  ointment ( Topical Given 03/17/24 0354)    Clinical Course as of 03/17/24 0618  Thu Mar 17, 2024  0606  Patient to ED after witnessed fall at [facility]. Negative imaging. Skin tear addressed. Will transport back to [facility]. [SU]    Clinical Course User Index [SU] [name] Balls, PA-C                                 Medical Decision Making Amount and/or Complexity of Data Reviewed Radiology: ordered.  Risk OTC drugs.        Final diagnoses:  Fall, initial encounter  Skin tear of right elbow without complication, initial encounter  Hip mass, left    ED Discharge Orders     None          [name] Balls RIGGERS 03/17/24 0618    Palumbo, April, MD 03/17/24 [id number]

## 2024-03-17 NOTE — ED Triage Notes (Signed)
 Pt BIBA from memory care facility for witnessed fall. Pt did hit his head, no thinners, no LOC. Hx of dementia, alert to self only. Denies any pain during triage.

## 2024-03-17 NOTE — ED Notes (Signed)
 Pt brief wet, changed and changed in to dry gown.

## 2024-03-17 NOTE — ED Notes (Signed)
 Skin tear on right elbow cleaned and bacitracin  ointment applied. Foam bandage placed.

## 2024-03-18 DIAGNOSIS — R1311 Dysphagia, oral phase: Secondary | ICD-10-CM | POA: Diagnosis not present

## 2024-03-18 DIAGNOSIS — R1312 Dysphagia, oropharyngeal phase: Secondary | ICD-10-CM | POA: Diagnosis not present

## 2024-03-22 DIAGNOSIS — R1312 Dysphagia, oropharyngeal phase: Secondary | ICD-10-CM | POA: Diagnosis not present

## 2024-03-22 DIAGNOSIS — R1311 Dysphagia, oral phase: Secondary | ICD-10-CM | POA: Diagnosis not present

## 2024-03-23 DIAGNOSIS — G7000 Myasthenia gravis without (acute) exacerbation: Secondary | ICD-10-CM | POA: Diagnosis not present

## 2024-03-23 DIAGNOSIS — M5451 Vertebrogenic low back pain: Secondary | ICD-10-CM | POA: Diagnosis not present

## 2024-03-23 DIAGNOSIS — G309 Alzheimer's disease, unspecified: Secondary | ICD-10-CM | POA: Diagnosis not present

## 2024-03-23 DIAGNOSIS — M6281 Muscle weakness (generalized): Secondary | ICD-10-CM | POA: Diagnosis not present

## 2024-03-23 DIAGNOSIS — R634 Abnormal weight loss: Secondary | ICD-10-CM | POA: Diagnosis not present

## 2024-03-23 DIAGNOSIS — K219 Gastro-esophageal reflux disease without esophagitis: Secondary | ICD-10-CM | POA: Diagnosis not present

## 2024-03-23 DIAGNOSIS — R2242 Localized swelling, mass and lump, left lower limb: Secondary | ICD-10-CM | POA: Diagnosis not present

## 2024-03-23 DIAGNOSIS — R451 Restlessness and agitation: Secondary | ICD-10-CM | POA: Diagnosis not present

## 2024-03-23 DIAGNOSIS — S50311A Abrasion of right elbow, initial encounter: Secondary | ICD-10-CM | POA: Diagnosis not present

## 2024-03-23 DIAGNOSIS — Z79899 Other long term (current) drug therapy: Secondary | ICD-10-CM | POA: Diagnosis not present

## 2024-03-23 DIAGNOSIS — W1830XA Fall on same level, unspecified, initial encounter: Secondary | ICD-10-CM | POA: Diagnosis not present

## 2024-03-25 DIAGNOSIS — R1312 Dysphagia, oropharyngeal phase: Secondary | ICD-10-CM | POA: Diagnosis not present

## 2024-03-29 DIAGNOSIS — R1312 Dysphagia, oropharyngeal phase: Secondary | ICD-10-CM | POA: Diagnosis not present

## 2024-03-29 DIAGNOSIS — R1311 Dysphagia, oral phase: Secondary | ICD-10-CM | POA: Diagnosis not present

## 2024-03-31 DIAGNOSIS — R1312 Dysphagia, oropharyngeal phase: Secondary | ICD-10-CM | POA: Diagnosis not present

## 2024-03-31 DIAGNOSIS — R1311 Dysphagia, oral phase: Secondary | ICD-10-CM | POA: Diagnosis not present

## 2024-04-05 DIAGNOSIS — R1312 Dysphagia, oropharyngeal phase: Secondary | ICD-10-CM | POA: Diagnosis not present

## 2024-04-13 DIAGNOSIS — R1312 Dysphagia, oropharyngeal phase: Secondary | ICD-10-CM | POA: Diagnosis not present

## 2024-04-19 DIAGNOSIS — R1312 Dysphagia, oropharyngeal phase: Secondary | ICD-10-CM | POA: Diagnosis not present

## 2024-04-20 DIAGNOSIS — M79675 Pain in left toe(s): Secondary | ICD-10-CM | POA: Diagnosis not present

## 2024-04-20 DIAGNOSIS — R2681 Unsteadiness on feet: Secondary | ICD-10-CM | POA: Diagnosis not present

## 2024-04-20 DIAGNOSIS — L608 Other nail disorders: Secondary | ICD-10-CM | POA: Diagnosis not present

## 2024-04-20 DIAGNOSIS — G300 Alzheimer's disease with early onset: Secondary | ICD-10-CM | POA: Diagnosis not present

## 2024-04-20 DIAGNOSIS — M79674 Pain in right toe(s): Secondary | ICD-10-CM | POA: Diagnosis not present

## 2024-04-20 DIAGNOSIS — L851 Acquired keratosis [keratoderma] palmaris et plantaris: Secondary | ICD-10-CM | POA: Diagnosis not present

## 2024-04-20 DIAGNOSIS — B351 Tinea unguium: Secondary | ICD-10-CM | POA: Diagnosis not present
